# Patient Record
Sex: Female | Born: 1945
Health system: Southern US, Community
[De-identification: ages and names within clinical notes are randomized; demographics above are authoritative.]

## PROBLEM LIST (undated history)

## (undated) ENCOUNTER — Emergency Department (HOSPITAL_COMMUNITY)

## (undated) DIAGNOSIS — I10 Essential (primary) hypertension: Secondary | ICD-10-CM

## (undated) DIAGNOSIS — E785 Hyperlipidemia, unspecified: Secondary | ICD-10-CM

## (undated) DIAGNOSIS — C801 Malignant (primary) neoplasm, unspecified: Secondary | ICD-10-CM

## (undated) DIAGNOSIS — K219 Gastro-esophageal reflux disease without esophagitis: Secondary | ICD-10-CM

## (undated) DIAGNOSIS — C3490 Malignant neoplasm of unspecified part of unspecified bronchus or lung: Secondary | ICD-10-CM

## (undated) DIAGNOSIS — M1990 Unspecified osteoarthritis, unspecified site: Secondary | ICD-10-CM

## (undated) DIAGNOSIS — G4700 Insomnia, unspecified: Secondary | ICD-10-CM

## (undated) DIAGNOSIS — S42309A Unspecified fracture of shaft of humerus, unspecified arm, initial encounter for closed fracture: Secondary | ICD-10-CM

## (undated) DIAGNOSIS — F419 Anxiety disorder, unspecified: Secondary | ICD-10-CM

## (undated) DIAGNOSIS — J449 Chronic obstructive pulmonary disease, unspecified: Secondary | ICD-10-CM

## (undated) HISTORY — DX: Gastro-esophageal reflux disease without esophagitis: K21.9

## (undated) HISTORY — DX: Unspecified fracture of shaft of humerus, unspecified arm, initial encounter for closed fracture: S42.309A

## (undated) HISTORY — DX: Malignant neoplasm of unspecified part of unspecified bronchus or lung: C34.90

## (undated) HISTORY — PX: OTHER SURGICAL HISTORY: SHX169

## (undated) HISTORY — DX: Essential (primary) hypertension: I10

## (undated) HISTORY — DX: Hyperlipidemia, unspecified: E78.5

## (undated) HISTORY — DX: Anxiety disorder, unspecified: F41.9

## (undated) HISTORY — PX: JOINT REPLACEMENT: SHX530

## (undated) HISTORY — DX: Insomnia, unspecified: G47.00

---

## 1976-11-17 HISTORY — PX: TUBAL LIGATION: SHX77

## 2005-11-17 DIAGNOSIS — S42309A Unspecified fracture of shaft of humerus, unspecified arm, initial encounter for closed fracture: Secondary | ICD-10-CM

## 2005-11-17 HISTORY — DX: Unspecified fracture of shaft of humerus, unspecified arm, initial encounter for closed fracture: S42.309A

## 2006-02-17 ENCOUNTER — Encounter: Admission: RE | Admit: 2006-02-17 | Discharge: 2006-05-18 | Payer: Self-pay | Admitting: Orthopaedic Surgery

## 2006-05-19 ENCOUNTER — Encounter: Admission: RE | Admit: 2006-05-19 | Discharge: 2006-06-16 | Payer: Self-pay | Admitting: Orthopaedic Surgery

## 2009-12-27 ENCOUNTER — Emergency Department (HOSPITAL_COMMUNITY): Admission: EM | Admit: 2009-12-27 | Discharge: 2009-12-27 | Payer: Self-pay | Admitting: Emergency Medicine

## 2011-07-14 ENCOUNTER — Ambulatory Visit: Payer: Self-pay | Admitting: Physical Therapy

## 2011-07-15 ENCOUNTER — Ambulatory Visit: Payer: Medicare Other | Attending: Orthopaedic Surgery | Admitting: Physical Therapy

## 2011-07-15 DIAGNOSIS — R5381 Other malaise: Secondary | ICD-10-CM | POA: Insufficient documentation

## 2011-07-15 DIAGNOSIS — M25669 Stiffness of unspecified knee, not elsewhere classified: Secondary | ICD-10-CM | POA: Insufficient documentation

## 2011-07-15 DIAGNOSIS — IMO0001 Reserved for inherently not codable concepts without codable children: Secondary | ICD-10-CM | POA: Insufficient documentation

## 2011-07-15 DIAGNOSIS — M25569 Pain in unspecified knee: Secondary | ICD-10-CM | POA: Insufficient documentation

## 2011-07-16 ENCOUNTER — Ambulatory Visit: Payer: Medicare Other | Admitting: Physical Therapy

## 2011-07-18 ENCOUNTER — Ambulatory Visit: Payer: Medicare Other | Admitting: Physical Therapy

## 2011-07-22 ENCOUNTER — Ambulatory Visit: Payer: Medicare Other | Attending: Orthopaedic Surgery | Admitting: *Deleted

## 2011-07-22 DIAGNOSIS — M25669 Stiffness of unspecified knee, not elsewhere classified: Secondary | ICD-10-CM | POA: Insufficient documentation

## 2011-07-22 DIAGNOSIS — M25569 Pain in unspecified knee: Secondary | ICD-10-CM | POA: Insufficient documentation

## 2011-07-22 DIAGNOSIS — R5381 Other malaise: Secondary | ICD-10-CM | POA: Insufficient documentation

## 2011-07-22 DIAGNOSIS — IMO0001 Reserved for inherently not codable concepts without codable children: Secondary | ICD-10-CM | POA: Insufficient documentation

## 2011-07-24 ENCOUNTER — Ambulatory Visit: Payer: Medicare Other | Admitting: *Deleted

## 2011-07-29 ENCOUNTER — Ambulatory Visit: Payer: Medicare Other | Admitting: Physical Therapy

## 2011-07-31 ENCOUNTER — Ambulatory Visit: Payer: Medicare Other | Admitting: Physical Therapy

## 2011-08-04 ENCOUNTER — Ambulatory Visit: Payer: Medicare Other | Admitting: Physical Therapy

## 2011-08-05 ENCOUNTER — Ambulatory Visit: Payer: Medicare Other | Admitting: *Deleted

## 2011-08-08 ENCOUNTER — Ambulatory Visit: Payer: Medicare Other | Admitting: *Deleted

## 2011-08-11 ENCOUNTER — Ambulatory Visit: Payer: Medicare Other | Admitting: Physical Therapy

## 2011-08-13 ENCOUNTER — Ambulatory Visit: Payer: Medicare Other | Admitting: Physical Therapy

## 2011-08-15 ENCOUNTER — Ambulatory Visit: Payer: Medicare Other | Admitting: Physical Therapy

## 2011-08-19 ENCOUNTER — Ambulatory Visit: Payer: Medicare Other | Attending: Orthopaedic Surgery | Admitting: *Deleted

## 2011-08-19 DIAGNOSIS — R5381 Other malaise: Secondary | ICD-10-CM | POA: Insufficient documentation

## 2011-08-19 DIAGNOSIS — IMO0001 Reserved for inherently not codable concepts without codable children: Secondary | ICD-10-CM | POA: Insufficient documentation

## 2011-08-19 DIAGNOSIS — M25569 Pain in unspecified knee: Secondary | ICD-10-CM | POA: Insufficient documentation

## 2011-08-19 DIAGNOSIS — M25669 Stiffness of unspecified knee, not elsewhere classified: Secondary | ICD-10-CM | POA: Insufficient documentation

## 2011-08-20 ENCOUNTER — Ambulatory Visit: Payer: Medicare Other | Admitting: Physical Therapy

## 2011-08-22 ENCOUNTER — Ambulatory Visit: Payer: Medicare Other | Admitting: Physical Therapy

## 2011-08-26 ENCOUNTER — Ambulatory Visit: Payer: Medicare Other | Admitting: Physical Therapy

## 2011-08-28 ENCOUNTER — Ambulatory Visit: Payer: Medicare Other | Admitting: Physical Therapy

## 2011-08-29 ENCOUNTER — Ambulatory Visit: Payer: Medicare Other | Admitting: *Deleted

## 2011-09-02 ENCOUNTER — Ambulatory Visit: Payer: Medicare Other | Admitting: Physical Therapy

## 2011-09-03 ENCOUNTER — Ambulatory Visit: Payer: Medicare Other | Admitting: Physical Therapy

## 2011-09-05 ENCOUNTER — Ambulatory Visit: Payer: Medicare Other | Admitting: Physical Therapy

## 2011-09-08 ENCOUNTER — Ambulatory Visit: Payer: Medicare Other | Admitting: Physical Therapy

## 2011-09-10 ENCOUNTER — Ambulatory Visit: Payer: Medicare Other | Admitting: Physical Therapy

## 2011-09-12 ENCOUNTER — Ambulatory Visit: Payer: Medicare Other | Admitting: *Deleted

## 2011-09-16 ENCOUNTER — Ambulatory Visit: Payer: Medicare Other | Admitting: Physical Therapy

## 2011-09-18 ENCOUNTER — Ambulatory Visit: Payer: Medicare Other | Attending: Orthopaedic Surgery | Admitting: *Deleted

## 2011-09-18 DIAGNOSIS — M25569 Pain in unspecified knee: Secondary | ICD-10-CM | POA: Insufficient documentation

## 2011-09-18 DIAGNOSIS — IMO0001 Reserved for inherently not codable concepts without codable children: Secondary | ICD-10-CM | POA: Insufficient documentation

## 2011-09-18 DIAGNOSIS — R5381 Other malaise: Secondary | ICD-10-CM | POA: Insufficient documentation

## 2011-09-18 DIAGNOSIS — M25669 Stiffness of unspecified knee, not elsewhere classified: Secondary | ICD-10-CM | POA: Insufficient documentation

## 2011-10-06 DIAGNOSIS — Z96659 Presence of unspecified artificial knee joint: Secondary | ICD-10-CM | POA: Insufficient documentation

## 2013-02-23 DIAGNOSIS — H35379 Puckering of macula, unspecified eye: Secondary | ICD-10-CM | POA: Insufficient documentation

## 2013-02-23 DIAGNOSIS — IMO0002 Reserved for concepts with insufficient information to code with codable children: Secondary | ICD-10-CM | POA: Insufficient documentation

## 2013-05-26 ENCOUNTER — Other Ambulatory Visit: Payer: Self-pay | Admitting: Nurse Practitioner

## 2013-05-27 NOTE — Telephone Encounter (Signed)
Has appt 06/29/13, last filled 07/28/12

## 2013-05-31 ENCOUNTER — Other Ambulatory Visit: Payer: Self-pay | Admitting: Nurse Practitioner

## 2013-06-29 ENCOUNTER — Ambulatory Visit (INDEPENDENT_AMBULATORY_CARE_PROVIDER_SITE_OTHER): Payer: Medicare Other | Admitting: Nurse Practitioner

## 2013-06-29 ENCOUNTER — Encounter: Payer: Self-pay | Admitting: Nurse Practitioner

## 2013-06-29 ENCOUNTER — Other Ambulatory Visit: Payer: Medicare Other

## 2013-06-29 VITALS — BP 144/80 | HR 68 | Temp 98.3°F | Ht 62.0 in | Wt 184.0 lb

## 2013-06-29 DIAGNOSIS — K5732 Diverticulitis of large intestine without perforation or abscess without bleeding: Secondary | ICD-10-CM

## 2013-06-29 DIAGNOSIS — M8580 Other specified disorders of bone density and structure, unspecified site: Secondary | ICD-10-CM | POA: Insufficient documentation

## 2013-06-29 DIAGNOSIS — M25519 Pain in unspecified shoulder: Secondary | ICD-10-CM

## 2013-06-29 DIAGNOSIS — M899 Disorder of bone, unspecified: Secondary | ICD-10-CM

## 2013-06-29 DIAGNOSIS — K219 Gastro-esophageal reflux disease without esophagitis: Secondary | ICD-10-CM | POA: Insufficient documentation

## 2013-06-29 DIAGNOSIS — K5730 Diverticulosis of large intestine without perforation or abscess without bleeding: Secondary | ICD-10-CM | POA: Insufficient documentation

## 2013-06-29 DIAGNOSIS — M949 Disorder of cartilage, unspecified: Secondary | ICD-10-CM

## 2013-06-29 DIAGNOSIS — G4700 Insomnia, unspecified: Secondary | ICD-10-CM | POA: Insufficient documentation

## 2013-06-29 DIAGNOSIS — M25512 Pain in left shoulder: Secondary | ICD-10-CM

## 2013-06-29 DIAGNOSIS — F411 Generalized anxiety disorder: Secondary | ICD-10-CM | POA: Insufficient documentation

## 2013-06-29 DIAGNOSIS — Z23 Encounter for immunization: Secondary | ICD-10-CM

## 2013-06-29 DIAGNOSIS — E785 Hyperlipidemia, unspecified: Secondary | ICD-10-CM | POA: Insufficient documentation

## 2013-06-29 DIAGNOSIS — I10 Essential (primary) hypertension: Secondary | ICD-10-CM | POA: Insufficient documentation

## 2013-06-29 MED ORDER — LOSARTAN POTASSIUM-HCTZ 100-25 MG PO TABS: 1.0000 | ORAL_TABLET | Freq: Every day | ORAL | Status: DC

## 2013-06-29 MED ORDER — ATORVASTATIN CALCIUM 40 MG PO TABS: 40.0000 mg | ORAL_TABLET | Freq: Every day | ORAL | Status: DC

## 2013-06-29 MED ORDER — TRAZODONE HCL 50 MG PO TABS: 50.0000 mg | ORAL_TABLET | Freq: Every day | ORAL | Status: DC

## 2013-06-29 NOTE — Progress Notes (Signed)
Subjective:    Patient ID: Renee Maldonado, female    DOB: [birth year], 67 y.o.   MRN: [id number]  Hypertension This is a chronic problem. The current episode started more than 1 year ago. The problem is unchanged. The problem is controlled. Pertinent negatives include no blurred vision, chest pain, headaches, neck pain, orthopnea, palpitations, peripheral edema, PND, shortness of breath or sweats. There are no associated agents to hypertension. Risk factors for coronary artery disease include dyslipidemia, obesity and post-menopausal state. Past treatments include angiotensin blockers and diuretics. The current treatment provides moderate improvement. Compliance problems include diet and exercise.   Hyperlipidemia This is a chronic problem. The current episode started more than 1 year ago. The problem is uncontrolled. Recent lipid tests were reviewed and are high. Exacerbating diseases include obesity. She has no history of diabetes, hypothyroidism or liver disease. Factors aggravating her hyperlipidemia include thiazides. Pertinent negatives include no chest pain or shortness of breath. Current antihyperlipidemic treatment includes statins. There are no compliance problems.  Risk factors for coronary artery disease include hypertension, family history, obesity and post-menopausal.  GERD Currently trying diet control Ostenpenia Not on any meds- is trying weight bearing exercsie but doesn't do daily.Last dexa scan was 11/20/10. Insomnia Trazadone nightly- rest well Diverticulitis Patient tries to watch diet- no recent flare ups.  * Patient c/o left shoulder pain- Has had major tendon repair several years ago- woke up with pain while at the beach- hurts to move it.    Review of Systems  HENT: Negative for neck pain.   Eyes: Negative for blurred vision.  Respiratory: Negative for shortness of breath.   Cardiovascular: Negative for chest pain, palpitations, orthopnea and PND.  Neurological: Negative  for headaches.  All other systems reviewed and are negative.       Objective:   Physical Exam  Constitutional: She is oriented to person, place, and time. She appears well-developed and well-nourished.  HENT:  Nose: Nose normal.  Mouth/Throat: Oropharynx is clear and moist.  Eyes: EOM are normal.  Neck: Trachea normal, normal range of motion and full passive range of motion without pain. Neck supple. No JVD present. Carotid bruit is not present. No thyromegaly present.  Cardiovascular: Normal rate, regular rhythm, normal heart sounds and intact distal pulses.  Exam reveals no gallop and no friction rub.   No murmur heard. Pulmonary/Chest: Effort normal and breath sounds normal.  Abdominal: Soft. Bowel sounds are normal. She exhibits no distension and no mass. There is no tenderness.  Musculoskeletal: Normal range of motion.  Decrease ROM of left shoulder due to pain on internal rotation and abduction. No numbness or tingling distally.  Lymphadenopathy:    She has no cervical adenopathy.  Neurological: She is alert and oriented to person, place, and time. She has normal reflexes.  Skin: Skin is warm and dry.  Psychiatric: She has a normal mood and affect. Her behavior is normal. Judgment and thought content normal.  BP 144/80  Pulse 68  Temp(Src) 98.3 F (36.8 C) (Oral)  Ht 5\' 2"  (1.575 m)  Wt 184 lb (83.462 kg)  BMI 33.65 kg/m2         Assessment & Plan:  1. Hyperlipidemia Low fat diet an dexercsie - NMR, lipoprofile - atorvastatin (LIPITOR) 40 MG tablet; Take 1 tablet (40 mg total) by mouth daily.  Dispense: 30 tablet; Refill: 5  2. Hypertension Low NA+ diet - CMP14+EGFR - losartan-hydrochlorothiazide (HYZAAR) 100-25 MG per tablet; Take 1 tablet by mouth daily.  Dispense: 30  tablet; Refill: 5  3. GAD (generalized anxiety disorder) Stress management  4. Diverticulitis of colon (without mention of hemorrhage) Watch foods with small seeds  5. GERD  (gastroesophageal reflux disease) Watch spicy and fatty foods  6. Insomnia Bedtime ritual - traZODone (DESYREL) 50 MG tablet; Take 1 tablet (50 mg total) by mouth at bedtime.  Dispense: 30 tablet; Refill: 5  7. Osteopenia Will schedule dexa scan  8. Left shoulder pain Follow up with orthopedic surgeon  Mary-Margaret Daphine Deutscher, FNP

## 2013-06-29 NOTE — Patient Instructions (Signed)

## 2013-06-30 ENCOUNTER — Telehealth: Payer: Self-pay | Admitting: Family Medicine

## 2013-06-30 ENCOUNTER — Other Ambulatory Visit (INDEPENDENT_AMBULATORY_CARE_PROVIDER_SITE_OTHER): Payer: Medicare Other

## 2013-06-30 DIAGNOSIS — R7989 Other specified abnormal findings of blood chemistry: Secondary | ICD-10-CM

## 2013-06-30 LAB — POCT CBC
HCT, POC: 39 % (ref 37.7–47.9)
Hemoglobin: 13.3 g/dL (ref 12.2–16.2)
MCH, POC: 28.7 pg (ref 27–31.2)
MCHC: 34.1 g/dL (ref 31.8–35.4)
MCV: 84.2 fL (ref 80–97)
MPV: 9 fL (ref 0–99.8)
POC LYMPH PERCENT: 24 %L (ref 10–50)
RDW, POC: 13.3 %

## 2013-06-30 LAB — FECAL OCCULT BLOOD, IMMUNOCHEMICAL: Fecal Occult Blood: POSITIVE — AB

## 2013-06-30 NOTE — Progress Notes (Signed)
Patient came in for labs only.

## 2013-07-01 LAB — NMR, LIPOPROFILE
HDL Particle Number: 22.7 umol/L — ABNORMAL LOW (ref >=30.5)
LDL Particle Number: 2006 nmol/L — ABNORMAL HIGH (ref <1000)
LP-IR Score: 65 — ABNORMAL HIGH (ref <=45)
Small LDL Particle Number: 1250 nmol/L — ABNORMAL HIGH (ref <=527)

## 2013-07-01 LAB — CMP14+EGFR
Albumin/Globulin Ratio: 2.1 (ref 1.1–2.5)
Alkaline Phosphatase: 92 IU/L (ref 39–117)
CO2: 25 mmol/L (ref 18–29)
GFR calc non Af Amer: 54 mL/min/{1.73_m2} — ABNORMAL LOW (ref >59)
Globulin, Total: 2.1 g/dL (ref 1.5–4.5)
Glucose: 104 mg/dL — ABNORMAL HIGH (ref 65–99)
Potassium: 4.6 mmol/L (ref 3.5–5.2)

## 2013-07-01 NOTE — Telephone Encounter (Signed)
Pt aware that needs to see ortho

## 2013-07-11 DIAGNOSIS — M75102 Unspecified rotator cuff tear or rupture of left shoulder, not specified as traumatic: Secondary | ICD-10-CM | POA: Insufficient documentation

## 2013-07-15 ENCOUNTER — Other Ambulatory Visit (INDEPENDENT_AMBULATORY_CARE_PROVIDER_SITE_OTHER): Payer: Medicare Other

## 2013-07-15 DIAGNOSIS — Z1212 Encounter for screening for malignant neoplasm of rectum: Secondary | ICD-10-CM

## 2013-07-22 ENCOUNTER — Other Ambulatory Visit: Payer: Self-pay | Admitting: Nurse Practitioner

## 2013-07-22 DIAGNOSIS — R195 Other fecal abnormalities: Secondary | ICD-10-CM

## 2013-08-10 ENCOUNTER — Ambulatory Visit (INDEPENDENT_AMBULATORY_CARE_PROVIDER_SITE_OTHER): Payer: Medicare Other

## 2013-08-10 ENCOUNTER — Ambulatory Visit (INDEPENDENT_AMBULATORY_CARE_PROVIDER_SITE_OTHER): Payer: Medicare Other | Admitting: Pharmacist

## 2013-08-10 VITALS — Ht 62.0 in | Wt 185.0 lb

## 2013-08-10 DIAGNOSIS — M8580 Other specified disorders of bone density and structure, unspecified site: Secondary | ICD-10-CM

## 2013-08-10 DIAGNOSIS — M899 Disorder of bone, unspecified: Secondary | ICD-10-CM

## 2013-08-10 DIAGNOSIS — E785 Hyperlipidemia, unspecified: Secondary | ICD-10-CM

## 2013-08-10 MED ORDER — ROSUVASTATIN CALCIUM 20 MG PO TABS: 20.0000 mg | ORAL_TABLET | Freq: Every day | ORAL | Status: DC

## 2013-08-10 MED ORDER — RALOXIFENE HCL 60 MG PO TABS: 60.0000 mg | ORAL_TABLET | Freq: Every day | ORAL | Status: DC

## 2013-08-10 NOTE — Progress Notes (Signed)
Patient ID: Renee Maldonado, female   DOB: Nov 28, 1945, 67 y.o.   MRN: [id number]   [facility] Current Height: Height: 5\' 2"  (157.5 cm)      Max Lifetime Height:  5\' 3"  Current Weight: Weight: 185 lb (83.915 kg)       Ethnicity:Caucasian    HPI: Does pt already have a diagnosis of:  Osteopenia?  Yes Osteoporosis?  No  Back Pain?  Yes       Kyphosis?  No Prior fracture?  Yes - ribs and humerus Med(s) for Osteoporosis/Osteopenia:  None  currently Med(s) previously tried for Osteoporosis/Osteopenia:  None - has refused treatment in past  **patient is concern that atorvastatin is not best choice to reduce CV disease for her.  Would like to discuss other options.                                                             PMH: Age at menopause:  67 yo Hysterectomy?  No Oophorectomy?  No HRT? Yes - Former.  Type/duration: pt unsure but only took a short time because had spotting Steroid Use?  No Thyroid med?  No History of cancer?  No History of digestive disorders (ie Crohn's)?  No Current or previous eating disorders?  No Last Vitamin D Result:  46 (11/2012) Last GFR Result:  54 (07/04/2013)   FH/SH: Family history of osteoporosis?  No Parent with history of hip fracture?  No Family history of breast cancer?  No Exercise?  No Smoking?  Yes - not interested in trying to quit at this time Alcohol?  No    Calcium Assessment Calcium Intake  # of servings/day  Calcium mg  Milk (8 oz) 0  x  300  = 0  Yogurt (4 oz) 0 x  200 = 0  Cheese (1 oz) 1 x  200 = 200  Other Calcium sources   250mg   Ca supplement 600mg  bid = 1200mg    Estimated calcium intake per day 1450mg     DEXA Results Date of Test T-Score for AP Spine L1-L4 T-Score for Total Left Hip T-Score for Total Right Hip  08/10/2013 -1.9 -0.8 -0.9  11/20/2010 -1.1 -0.4 -0.5  08/23/2008 -1.7 -0.2 -0.5  08/17/2006 -1.8 -0.2 -0.6   FRAX 10 year estimate: Total FX risk:  17%  (consider medication if >/= 20%) Hip FX  risk:  4.1%  (consider medication if >/= 3%)  Assessment: Osteopenia with significant fracture risk and decrease in BMD compared to 2 years ago Hyperlipidemia - patient would like to switch from atorvastatin or Crestor  Recommendations: 1.  Start  reloxifine (EVISTA) 60mg  1 tablet dialy  Discontinue atorvastatin and start Crestor 20mg  1 tablet daily 2.  continue calcium 1200mg  daily through supplementation or diet.  3.  recommend weight bearing exercise - 30 minutes at least 4 days  per week.   4.  Counseled and educated about fall risk and prevention.  Recheck DEXA:  2 years  Time spent counseling patient:  40 minutes

## 2013-08-17 ENCOUNTER — Telehealth: Payer: Self-pay | Admitting: Nurse Practitioner

## 2013-08-18 ENCOUNTER — Telehealth: Payer: Self-pay | Admitting: Nurse Practitioner

## 2013-08-18 NOTE — Telephone Encounter (Signed)
Patient was concerned about side effects of evista 60mg .  We discussed concern of increased LDL and HDL as well as her daily smoking and heart risk with evista.   She has decided not to continue evista.

## 2013-08-18 NOTE — Telephone Encounter (Signed)
dulplicate call - patient and practitioner keep missing each other's calls

## 2013-08-26 ENCOUNTER — Encounter (INDEPENDENT_AMBULATORY_CARE_PROVIDER_SITE_OTHER): Payer: Self-pay | Admitting: *Deleted

## 2013-08-31 DIAGNOSIS — M75100 Unspecified rotator cuff tear or rupture of unspecified shoulder, not specified as traumatic: Secondary | ICD-10-CM | POA: Insufficient documentation

## 2013-09-14 ENCOUNTER — Encounter (INDEPENDENT_AMBULATORY_CARE_PROVIDER_SITE_OTHER): Payer: Self-pay | Admitting: Internal Medicine

## 2013-09-14 ENCOUNTER — Ambulatory Visit (INDEPENDENT_AMBULATORY_CARE_PROVIDER_SITE_OTHER): Payer: Medicare Other | Admitting: Internal Medicine

## 2013-09-14 VITALS — BP 133/66 | HR 76 | Temp 98.0°F | Ht 62.0 in | Wt 187.1 lb

## 2013-09-14 DIAGNOSIS — R195 Other fecal abnormalities: Secondary | ICD-10-CM | POA: Insufficient documentation

## 2013-09-14 NOTE — Patient Instructions (Addendum)
Your stool was negative today in the office. I think your bleeding was from the ASA and Aleve that you were taking.  Will see back in 3 months. Will get a CBC and check your stool again

## 2013-09-14 NOTE — Progress Notes (Signed)
Subjective:     Patient ID: Renee Maldonado, female   DOB: Dec 09, 1945, 67 y.o.   MRN: [id number]  HPI Referred to our office by Paulene Floor FNP, [facility] for 2 heme positive stool cards.This was during routine physical. She tells me she was taking Aspirin/Aleve. She had injured her shoulder.  She was taking Aleve two a day. This did not help so she started taking Aspirin 325mg . She was taking Aspirin 325mg  every 3-4 hrs. Occurred in July She had been taking for about 4 weeks. She is not taking anything now except a Baby ASA 81mg   She denies seeing any blood in her stool. She has not lost any weight. Her appetite is good. She usually has a BM several times a day.  Her last colonoscopy was February of 2012 for screening by [facility] in [facility] by Dr. Bernie Covey. Internal hemorrhoids in addition to minimal diverticulosis in the sigmoid colon, but remainder of colon appeared normal.   Any NSAIDs H and H stable at 13.3 and 39.0 CBC    Component Value Date/Time   WBC 6.3 06/30/2013 1533   RBC 4.6 06/30/2013 1533   HGB 13.3 06/30/2013 1533   HCT 39.0 06/30/2013 1533   MCV 84.2 06/30/2013 1533   MCH 28.7 06/30/2013 1533   MCHC 34.1 06/30/2013 1533     Review of Systems Current Outpatient Prescriptions  Medication Sig Dispense Refill  . Biotin 1 MG CAPS Take by mouth daily.      . Calcium-Vitamin D-Vitamin K (CALCIUM + D + K PO) Take by mouth 2 (two) times daily before a meal.      . cholecalciferol (VITAMIN D) 1000 UNITS tablet Take 1,000 Units by mouth daily.      . fish oil-omega-3 fatty acids 1000 MG capsule Take 1 g by mouth 2 (two) times daily before a meal.      . Garlic 1000 MG CAPS Take by mouth daily.      Marland Kitchen losartan-hydrochlorothiazide (HYZAAR) 100-25 MG per tablet Take 1 tablet by mouth daily.  30 tablet  5  . Lysine 500 MG TABS Take by mouth 2 (two) times daily before a meal.      . ranitidine (ZANTAC) 150 MG tablet Take 150 mg by  mouth 2 (two) times daily.      . rosuvastatin (CRESTOR) 20 MG tablet Take 1 tablet (20 mg total) by mouth daily.  30 tablet  2  . traZODone (DESYREL) 50 MG tablet Take 1 tablet (50 mg total) by mouth at bedtime.  30 tablet  5   No current facility-administered medications for this visit.   Past Medical History  Diagnosis Date  . Anxiety   . Hyperlipidemia   . Insomnia   . GERD (gastroesophageal reflux disease)   . Hypertension    Past Surgical History  Procedure Laterality Date  . Total knee arthroplasty      2012 left  . Left shoulder surgery      2007   Allergies  Allergen Reactions  . Benadryl [Diphenhydramine Hcl]   . Penicillins         Objective:   Physical Exam  Filed Vitals:   09/14/13 1439  BP: 133/66  Pulse: 76  Temp: 98 F (36.7 C)  Height: 5\' 2"  (1.575 m)  Weight: 187 lb 1.6 oz (84.868 kg)  Alert and oriented. Skin warm and dry. Oral mucosa is moist.   . Sclera anicteric, conjunctivae is pink. Thyroid not  enlarged. No cervical lymphadenopathy. Lungs clear. Heart regular rate and rhythm.  Abdomen is soft. Bowel sounds are positive. No hepatomegaly. No abdominal masses felt. No tenderness.  No edema to lower extremities. Stool brown and guaiac negative.     Assessment:    Heme positive stools. Last colonoscopy in 2012.    Plan:    Heme positive stools. Last colonoscopy 2012 was normal. Stools positive probably from the NSAIDs she was taking.  Her H and H are stable.    She was guaiac negative today.  Will see back in office in 3 months. Will check a CBC and I will guaiac her stool again.  If she is positive or she has dropped her hemoglobin, will proceed with a colonoscopy. I discussed this case with Dr. Karilyn Cota.

## 2013-09-15 ENCOUNTER — Telehealth (INDEPENDENT_AMBULATORY_CARE_PROVIDER_SITE_OTHER): Payer: Self-pay | Admitting: *Deleted

## 2013-09-15 DIAGNOSIS — K921 Melena: Secondary | ICD-10-CM

## 2013-09-15 NOTE — Telephone Encounter (Signed)
.  Per Delrae Rend to have in January 2015 , noted for the 28 th prior to her office visit.

## 2013-09-23 ENCOUNTER — Telehealth: Payer: Self-pay | Admitting: Nurse Practitioner

## 2013-09-23 MED ORDER — ROSUVASTATIN CALCIUM 20 MG PO TABS: 20.0000 mg | ORAL_TABLET | Freq: Every day | ORAL | Status: DC

## 2013-09-23 NOTE — Telephone Encounter (Signed)
rX sent to pharmacy 

## 2013-09-30 ENCOUNTER — Telehealth: Payer: Self-pay | Admitting: Nurse Practitioner

## 2013-09-30 MED ORDER — AZITHROMYCIN 250 MG PO TABS: ORAL_TABLET | ORAL | Status: DC

## 2013-09-30 NOTE — Telephone Encounter (Signed)
rx called in

## 2013-10-01 NOTE — Telephone Encounter (Signed)
Patient aware rx sent into pharmacy. 

## 2013-10-06 ENCOUNTER — Ambulatory Visit (INDEPENDENT_AMBULATORY_CARE_PROVIDER_SITE_OTHER): Payer: Medicare Other | Admitting: Nurse Practitioner

## 2013-10-06 ENCOUNTER — Encounter: Payer: Self-pay | Admitting: Nurse Practitioner

## 2013-10-06 VITALS — BP 138/82 | HR 64 | Temp 98.2°F | Ht 62.0 in | Wt 188.0 lb

## 2013-10-06 DIAGNOSIS — M8580 Other specified disorders of bone density and structure, unspecified site: Secondary | ICD-10-CM

## 2013-10-06 DIAGNOSIS — K219 Gastro-esophageal reflux disease without esophagitis: Secondary | ICD-10-CM

## 2013-10-06 DIAGNOSIS — R195 Other fecal abnormalities: Secondary | ICD-10-CM

## 2013-10-06 DIAGNOSIS — M899 Disorder of bone, unspecified: Secondary | ICD-10-CM

## 2013-10-06 DIAGNOSIS — F411 Generalized anxiety disorder: Secondary | ICD-10-CM

## 2013-10-06 DIAGNOSIS — E785 Hyperlipidemia, unspecified: Secondary | ICD-10-CM

## 2013-10-06 DIAGNOSIS — G4700 Insomnia, unspecified: Secondary | ICD-10-CM

## 2013-10-06 DIAGNOSIS — I10 Essential (primary) hypertension: Secondary | ICD-10-CM

## 2013-10-06 MED ORDER — RANITIDINE HCL 150 MG PO TABS: 150.0000 mg | ORAL_TABLET | Freq: Two times a day (BID) | ORAL | Status: DC

## 2013-10-06 MED ORDER — LOSARTAN POTASSIUM-HCTZ 100-25 MG PO TABS: 1.0000 | ORAL_TABLET | Freq: Every day | ORAL | Status: DC

## 2013-10-06 MED ORDER — TRAZODONE HCL 50 MG PO TABS: 50.0000 mg | ORAL_TABLET | Freq: Every day | ORAL | Status: DC

## 2013-10-06 NOTE — Patient Instructions (Signed)
Fat and Cholesterol Control Diet  Fat and cholesterol levels in your blood and organs are influenced by your diet. High levels of fat and cholesterol may lead to diseases of the heart, small and large blood vessels, gallbladder, liver, and pancreas.  CONTROLLING FAT AND CHOLESTEROL WITH DIET  Although exercise and lifestyle factors are important, your diet is key. That is because certain foods are known to raise cholesterol and others to lower it. The goal is to balance foods for their effect on cholesterol and more importantly, to replace saturated and trans fat with other types of fat, such as monounsaturated fat, polyunsaturated fat, and omega-3 fatty acids.  On average, a person should consume no more than 15 to 17 g of saturated fat daily. Saturated and trans fats are considered "bad" fats, and they will raise LDL cholesterol. Saturated fats are primarily found in animal products such as meats, butter, and cream. However, that does not mean you need to give up all your favorite foods. Today, there are good tasting, low-fat, low-cholesterol substitutes for most of the things you like to eat. Choose low-fat or nonfat alternatives. Choose round or loin cuts of red meat. These types of cuts are lowest in fat and cholesterol. Chicken (without the skin), fish, veal, and ground turkey breast are great choices. Eliminate fatty meats, such as hot dogs and salami. Even shellfish have little or no saturated fat. Have a 3 oz (85 g) portion when you eat lean meat, poultry, or fish.  Trans fats are also called "partially hydrogenated oils." They are oils that have been scientifically manipulated so that they are solid at room temperature resulting in a longer shelf life and improved taste and texture of foods in which they are added. Trans fats are found in stick margarine, some tub margarines, cookies, crackers, and baked goods.   When baking and cooking, oils are a great substitute for butter. The monounsaturated oils are  especially beneficial since it is believed they lower LDL and raise HDL. The oils you should avoid entirely are saturated tropical oils, such as coconut and palm.   Remember to eat a lot from food groups that are naturally free of saturated and trans fat, including fish, fruit, vegetables, beans, grains (barley, rice, couscous, bulgur wheat), and pasta (without cream sauces).   IDENTIFYING FOODS THAT LOWER FAT AND CHOLESTEROL   Soluble fiber may lower your cholesterol. This type of fiber is found in fruits such as apples, vegetables such as broccoli, potatoes, and carrots, legumes such as beans, peas, and lentils, and grains such as barley. Foods fortified with plant sterols (phytosterol) may also lower cholesterol. You should eat at least 2 g per day of these foods for a cholesterol lowering effect.   Read package labels to identify low-saturated fats, trans fat free, and low-fat foods at the supermarket. Select cheeses that have only 2 to 3 g saturated fat per ounce. Use a heart-healthy tub margarine that is free of trans fats or partially hydrogenated oil. When buying baked goods (cookies, crackers), avoid partially hydrogenated oils. Breads and muffins should be made from whole grains (whole-wheat or whole oat flour, instead of "flour" or "enriched flour"). Buy non-creamy canned soups with reduced salt and no added fats.   FOOD PREPARATION TECHNIQUES   Never deep-fry. If you must fry, either stir-fry, which uses very little fat, or use non-stick cooking sprays. When possible, broil, bake, or roast meats, and steam vegetables. Instead of putting butter or margarine on vegetables, use lemon   and herbs, applesauce, and cinnamon (for squash and sweet potatoes). Use nonfat yogurt, salsa, and low-fat dressings for salads.   LOW-SATURATED FAT / LOW-FAT FOOD SUBSTITUTES  Meats / Saturated Fat (g)  · Avoid: Steak, marbled (3 oz/85 g) / 11 g  · Choose: Steak, lean (3 oz/85 g) / 4 g  · Avoid: Hamburger (3 oz/85 g) / 7  g  · Choose: Hamburger, lean (3 oz/85 g) / 5 g  · Avoid: Ham (3 oz/85 g) / 6 g  · Choose: Ham, lean cut (3 oz/85 g) / 2.4 g  · Avoid: Chicken, with skin, dark meat (3 oz/85 g) / 4 g  · Choose: Chicken, skin removed, dark meat (3 oz/85 g) / 2 g  · Avoid: Chicken, with skin, light meat (3 oz/85 g) / 2.5 g  · Choose: Chicken, skin removed, light meat (3 oz/85 g) / 1 g  Dairy / Saturated Fat (g)  · Avoid: Whole milk (1 cup) / 5 g  · Choose: Low-fat milk, 2% (1 cup) / 3 g  · Choose: Low-fat milk, 1% (1 cup) / 1.5 g  · Choose: Skim milk (1 cup) / 0.3 g  · Avoid: Hard cheese (1 oz/28 g) / 6 g  · Choose: Skim milk cheese (1 oz/28 g) / 2 to 3 g  · Avoid: Cottage cheese, 4% fat (1 cup) / 6.5 g  · Choose: Low-fat cottage cheese, 1% fat (1 cup) / 1.5 g  · Avoid: Ice cream (1 cup) / 9 g  · Choose: Sherbet (1 cup) / 2.5 g  · Choose: Nonfat frozen yogurt (1 cup) / 0.3 g  · Choose: Frozen fruit bar / trace  · Avoid: Whipped cream (1 tbs) / 3.5 g  · Choose: Nondairy whipped topping (1 tbs) / 1 g  Condiments / Saturated Fat (g)  · Avoid: Mayonnaise (1 tbs) / 2 g  · Choose: Low-fat mayonnaise (1 tbs) / 1 g  · Avoid: Butter (1 tbs) / 7 g  · Choose: Extra light margarine (1 tbs) / 1 g  · Avoid: Coconut oil (1 tbs) / 11.8 g  · Choose: Olive oil (1 tbs) / 1.8 g  · Choose: Corn oil (1 tbs) / 1.7 g  · Choose: Safflower oil (1 tbs) / 1.2 g  · Choose: Sunflower oil (1 tbs) / 1.4 g  · Choose: Soybean oil (1 tbs) / 2.4 g  · Choose: Canola oil (1 tbs) / 1 g  Document Released: 11/03/2005 Document Revised: 02/28/2013 Document Reviewed: 04/24/2011  [employer]® Patient Information ©2014 [employer].

## 2013-10-06 NOTE — Progress Notes (Signed)
Subjective:    Patient ID: Renee Maldonado, female    DOB: 1946/05/16, 67 y.o.   MRN: [id number]  Hypertension This is a chronic problem. The current episode started more than 1 year ago. The problem is unchanged. The problem is controlled. Pertinent negatives include no blurred vision, chest pain, headaches, neck pain, orthopnea, palpitations, peripheral edema, PND, shortness of breath or sweats. There are no associated agents to hypertension. Risk factors for coronary artery disease include dyslipidemia, obesity and post-menopausal state. Past treatments include angiotensin blockers and diuretics. The current treatment provides moderate improvement. Compliance problems include diet and exercise.   Hyperlipidemia This is a chronic problem. The current episode started more than 1 year ago. The problem is uncontrolled. Recent lipid tests were reviewed and are high. Exacerbating diseases include obesity. She has no history of diabetes, hypothyroidism or liver disease. Factors aggravating her hyperlipidemia include thiazides. Pertinent negatives include no chest pain or shortness of breath. Current antihyperlipidemic treatment includes statins (recently changed from lipitor to crestor). There are no compliance problems.  Risk factors for coronary artery disease include hypertension, family history, obesity and post-menopausal.  GERD Currently trying diet control Ostenpenia Not on any meds- is trying weight bearing exercsie but doesn't do daily.Last dexa scan was 11/20/10. Insomnia Trazadone nightly- rest well Diverticulitis Patient tries to watch diet- no recent flare ups.  * Patient was suppose to have started on evista for osteopenia but after reading the side effects she refused to take. * 2 positive hemocults- went  To Dr. Dionicia Abler PA na dshe found no blood in stool- has a 3 month follow up which will be in January.  Review of Systems  Eyes: Negative for blurred vision.  Respiratory: Negative for  shortness of breath.   Cardiovascular: Negative for chest pain, palpitations, orthopnea and PND.  Musculoskeletal: Negative for neck pain.  Neurological: Negative for headaches.  All other systems reviewed and are negative.       Objective:   Physical Exam  Constitutional: She is oriented to person, place, and time. She appears well-developed and well-nourished.  HENT:  Nose: Nose normal.  Mouth/Throat: Oropharynx is clear and moist.  Eyes: EOM are normal.  Neck: Trachea normal, normal range of motion and full passive range of motion without pain. Neck supple. No JVD present. Carotid bruit is not present. No thyromegaly present.  Cardiovascular: Normal rate, regular rhythm, normal heart sounds and intact distal pulses.  Exam reveals no gallop and no friction rub.   No murmur heard. Pulmonary/Chest: Effort normal and breath sounds normal.  Abdominal: Soft. Bowel sounds are normal. She exhibits no distension and no mass. There is no tenderness.  Musculoskeletal: Normal range of motion.  Lymphadenopathy:    She has no cervical adenopathy.  Neurological: She is alert and oriented to person, place, and time. She has normal reflexes.  Skin: Skin is warm and dry.  Psychiatric: She has a normal mood and affect. Her behavior is normal. Judgment and thought content normal.  BP 155/92  Pulse 64  Temp(Src) 98.2 F (36.8 C) (Oral)  Ht 5\' 2"  (1.575 m)  Wt 188 lb (85.276 kg)  BMI 34.38 kg/m2         Assessment & Plan:   1. Osteopenia   2. Insomnia   3. Hypertension   4. Hyperlipidemia   5. Heme positive stool   6. GERD (gastroesophageal reflux disease)   7. GAD (generalized anxiety disorder)    Orders Placed This Encounter  Procedures  . CMP14+EGFR  .  NMR, lipoprofile   Meds ordered this encounter  Medications  . traZODone (DESYREL) 50 MG tablet    Sig: Take 1 tablet (50 mg total) by mouth at bedtime.    Dispense:  90 tablet    Refill:  1    Order Specific Question:   Supervising Provider    Answer:  Ernestina Penna [[id number]]  . losartan-hydrochlorothiazide (HYZAAR) 100-25 MG per tablet    Sig: Take 1 tablet by mouth daily.    Dispense:  90 tablet    Refill:  1    Order Specific Question:  Supervising Provider    Answer:  Ernestina Penna [[id number]]  . ranitidine (ZANTAC) 150 MG tablet    Sig: Take 1 tablet (150 mg total) by mouth 2 (two) times daily.    Dispense:  180 tablet    Refill:  1    Order Specific Question:  Supervising Provider    Answer:  Deborra Medina    Continue all meds Labs pending Diet and exercise encouraged Health maintenance reviewed Follow up in 3 month  Mary-Margaret Daphine Deutscher, FNP

## 2013-10-08 LAB — NMR, LIPOPROFILE
Cholesterol: 132 mg/dL (ref <200)
HDL Cholesterol by NMR: 46 mg/dL (ref >=40)
LDL Size: 21.3 nm (ref >20.5)
Small LDL Particle Number: 413 nmol/L (ref <=527)
Triglycerides by NMR: 78 mg/dL (ref <150)

## 2013-10-08 LAB — CMP14+EGFR
BUN: 28 mg/dL — ABNORMAL HIGH (ref 8–27)
Calcium: 9.6 mg/dL (ref 8.6–10.2)
Chloride: 102 mmol/L (ref 97–108)
Glucose: 112 mg/dL — ABNORMAL HIGH (ref 65–99)
Potassium: 5.2 mmol/L (ref 3.5–5.2)
Sodium: 144 mmol/L (ref 134–144)
Total Protein: 6.5 g/dL (ref 6.0–8.5)

## 2013-10-27 ENCOUNTER — Other Ambulatory Visit (INDEPENDENT_AMBULATORY_CARE_PROVIDER_SITE_OTHER): Payer: Self-pay | Admitting: *Deleted

## 2013-10-27 ENCOUNTER — Encounter (INDEPENDENT_AMBULATORY_CARE_PROVIDER_SITE_OTHER): Payer: Self-pay | Admitting: *Deleted

## 2013-10-27 DIAGNOSIS — K921 Melena: Secondary | ICD-10-CM

## 2013-11-09 ENCOUNTER — Ambulatory Visit (INDEPENDENT_AMBULATORY_CARE_PROVIDER_SITE_OTHER): Payer: Medicare Other | Admitting: General Practice

## 2013-11-09 ENCOUNTER — Encounter: Payer: Self-pay | Admitting: General Practice

## 2013-11-09 VITALS — BP 145/92 | HR 76 | Temp 99.6°F | Ht 62.0 in | Wt 188.0 lb

## 2013-11-09 DIAGNOSIS — R509 Fever, unspecified: Secondary | ICD-10-CM

## 2013-11-09 DIAGNOSIS — M791 Myalgia, unspecified site: Secondary | ICD-10-CM

## 2013-11-09 DIAGNOSIS — IMO0001 Reserved for inherently not codable concepts without codable children: Secondary | ICD-10-CM

## 2013-11-09 DIAGNOSIS — A088 Other specified intestinal infections: Secondary | ICD-10-CM

## 2013-11-09 DIAGNOSIS — R110 Nausea: Secondary | ICD-10-CM

## 2013-11-09 DIAGNOSIS — A084 Viral intestinal infection, unspecified: Secondary | ICD-10-CM

## 2013-11-09 LAB — POCT INFLUENZA A/B: Influenza B, POC: NEGATIVE

## 2013-11-09 MED ORDER — ONDANSETRON HCL 4 MG PO TABS: 4.0000 mg | ORAL_TABLET | Freq: Three times a day (TID) | ORAL | Status: DC | PRN

## 2013-11-09 NOTE — Patient Instructions (Signed)

## 2013-11-09 NOTE — Progress Notes (Signed)
   Subjective:    Patient ID: Renee Maldonado, female    DOB: 1946-02-16, 67 y.o.   MRN: [id number]  Fever  This is a new problem. The current episode started yesterday. The problem occurs daily. The problem has been unchanged. The maximum temperature noted was 101 to 101.9 F. The temperature was taken using an oral thermometer. Associated symptoms include muscle aches and nausea. Pertinent negatives include no chest pain, coughing, headaches or sore throat. She has tried acetaminophen for the symptoms. The treatment provided significant relief.  Headache  This is a new problem. The current episode started yesterday. The problem occurs intermittently. The problem has been gradually improving. The pain is located in the bilateral region. The pain does not radiate. The pain quality is similar to prior headaches. The quality of the pain is described as aching. The pain is at a severity of 2/10. Associated symptoms include a fever, muscle aches and nausea. Pertinent negatives include no back pain, blurred vision, coughing, dizziness, drainage, loss of balance, phonophobia, photophobia, rhinorrhea, scalp tenderness, sinus pressure, sore throat, tingling, tinnitus, visual change or weakness. Nothing aggravates the symptoms. She has tried acetaminophen for the symptoms. Her past medical history is significant for hypertension. There is no history of cluster headaches or recent head traumas.  Reports recent exposure to stomach flu.     Review of Systems  Constitutional: Positive for fever.  HENT: Negative for rhinorrhea, sinus pressure, sore throat and tinnitus.   Eyes: Negative for blurred vision and photophobia.  Respiratory: Negative for cough.   Cardiovascular: Negative for chest pain.  Gastrointestinal: Positive for nausea.  Musculoskeletal: Negative for back pain.  Neurological: Negative for dizziness, tingling, weakness, headaches and loss of balance.       Objective:   Physical Exam    Constitutional: She is oriented to person, place, and time. She appears well-developed and well-nourished.  HENT:  Head: Normocephalic and atraumatic.  Right Ear: External ear normal.  Left Ear: External ear normal.  Mouth/Throat: Oropharynx is clear and moist.  Eyes: EOM are normal. Pupils are equal, round, and reactive to light.  Neck: Normal range of motion. Neck supple. No thyromegaly present.  Cardiovascular: Normal rate, regular rhythm and normal heart sounds.   Pulmonary/Chest: Effort normal and breath sounds normal.  Abdominal: Soft. Bowel sounds are normal. She exhibits no distension. There is no tenderness.  Lymphadenopathy:    She has no cervical adenopathy.  Neurological: She is alert and oriented to person, place, and time.  Skin: Skin is warm and dry.  Psychiatric: She has a normal mood and affect.      Results for orders placed in visit on 11/09/13  POCT INFLUENZA A/B      Result Value Range   Influenza A, POC Negative     Influenza B, POC Negative         Assessment & Plan:  1. Fever  - POCT Influenza A/B  2. Muscle ache  - POCT Influenza A/B  3. Nausea alone  - ondansetron (ZOFRAN) 4 MG tablet; Take 1 tablet (4 mg total) by mouth every 8 (eight) hours as needed for nausea or vomiting.  Dispense: 20 tablet; Refill: 0 4. Viral gastroenteritis -discussed and provided information pertaining to norovirus -RTO if symptoms worsen or unresolved, may seek emergency medical treatment  -Patient verbalized understanding Coralie Keens, FNP-C

## 2013-12-15 ENCOUNTER — Ambulatory Visit (INDEPENDENT_AMBULATORY_CARE_PROVIDER_SITE_OTHER): Payer: 59 | Admitting: Internal Medicine

## 2013-12-15 ENCOUNTER — Encounter (INDEPENDENT_AMBULATORY_CARE_PROVIDER_SITE_OTHER): Payer: Self-pay | Admitting: Internal Medicine

## 2013-12-15 ENCOUNTER — Ambulatory Visit (INDEPENDENT_AMBULATORY_CARE_PROVIDER_SITE_OTHER): Payer: Medicare (Managed Care) | Admitting: Internal Medicine

## 2013-12-15 VITALS — BP 144/80 | HR 60 | Temp 98.0°F | Ht 62.5 in | Wt 193.5 lb

## 2013-12-15 DIAGNOSIS — R195 Other fecal abnormalities: Secondary | ICD-10-CM

## 2013-12-15 LAB — CBC
HCT: 37.7 % (ref 36.0–46.0)
Hemoglobin: 12.3 g/dL (ref 12.0–15.0)
MCH: 27.8 pg (ref 26.0–34.0)
MCHC: 32.6 g/dL (ref 30.0–36.0)
MCV: 85.3 fL (ref 78.0–100.0)
Platelets: 257 10*3/uL (ref 150–400)
RBC: 4.42 MIL/uL (ref 3.87–5.11)
RDW: 14 % (ref 11.5–15.5)
WBC: 4 10*3/uL (ref 4.0–10.5)

## 2013-12-15 NOTE — Progress Notes (Signed)
Subjective:     Patient ID: Renee Maldonado, female   DOB: 04/14/1946, 68 y.o.   MRN: [id number]  HPI Here today for f/u. She was last seen in our office in October. She had 2 heme positive stools cards per Ronnald Collum FNP, [facility].  She had been taking ASA/Aleve in July after she injuring her shoulder. She had been taking ASA 325mg  every 3-4 hrs. And two Aleve a day. I discussed this case with Dr. Laural Golden in October. If her hemocult is positive th is visit will proceed with a colonoscopy.  She tells me she is doing good. Appetite is good. No weight loss. No abdominal pain. No dysphagia.  She usually has a BM 3-4 times in the am. No melena or bright red rectal bleeding.   Her last colonoscopy was February of 2012 for screening by [facility] in [facility] by Dr. Pura Spice. Internal hemorrhoids in addition to minimal diverticulosis in the sigmoid colon, but remainder of colon appeared normal.      12/14/2013 H and H 12.3 and 37.7  CBC    Component Value Date/Time   WBC 4.0 12/14/2013 1100   WBC 6.3 06/30/2013 1533   RBC 4.42 12/14/2013 1100   RBC 4.6 06/30/2013 1533   HGB 12.3 12/14/2013 1100   HGB 13.3 06/30/2013 1533   HCT 37.7 12/14/2013 1100   HCT 39.0 06/30/2013 1533   PLT 257 12/14/2013 1100   MCV 85.3 12/14/2013 1100   MCV 84.2 06/30/2013 1533   MCH 27.8 12/14/2013 1100   MCH 28.7 06/30/2013 1533   MCHC 32.6 12/14/2013 1100   MCHC 34.1 06/30/2013 1533   RDW 14.0 12/14/2013 1100      Review of Systems see hpi Current Outpatient Prescriptions  Medication Sig Dispense Refill  . Biotin 1 MG CAPS Take by mouth daily.      . Calcium-Vitamin D-Vitamin K (CALCIUM + D + K PO) Take by mouth 2 (two) times daily before a meal.      . cholecalciferol (VITAMIN D) 1000 UNITS tablet Take 1,000 Units by mouth daily.      . fish oil-omega-3 fatty acids 1000 MG capsule Take 1 g by mouth 2 (two) times daily before a meal.      . Garlic 0076 MG CAPS  Take by mouth daily.      Marland Kitchen losartan-hydrochlorothiazide (HYZAAR) 100-25 MG per tablet Take 1 tablet by mouth daily.  90 tablet  1  . Lysine 500 MG TABS Take by mouth 2 (two) times daily before a meal.      . ondansetron (ZOFRAN) 4 MG tablet Take 1 tablet (4 mg total) by mouth every 8 (eight) hours as needed for nausea or vomiting.  20 tablet  0  . ranitidine (ZANTAC) 150 MG tablet Take 1 tablet (150 mg total) by mouth 2 (two) times daily.  180 tablet  1  . rosuvastatin (CRESTOR) 20 MG tablet Take 1 tablet (20 mg total) by mouth daily.  90 tablet  3  . traZODone (DESYREL) 50 MG tablet Take 1 tablet (50 mg total) by mouth at bedtime.  90 tablet  1   No current facility-administered medications for this visit.   Past Medical History  Diagnosis Date  . Anxiety   . Hyperlipidemia   . Insomnia   . GERD (gastroesophageal reflux disease)   . Hypertension    Past Surgical History  Procedure Laterality Date  . Total knee arthroplasty  2012 left  . Left shoulder surgery      2007   Allergies  Allergen Reactions  . Benadryl [Diphenhydramine Hcl]   . Penicillins         Objective:   Physical Exam  Filed Vitals:   12/15/13 1059  BP: 144/80  Pulse: 60  Temp: 98 F (36.7 C)  Height: 5' 2.5" (1.588 m)  Weight: 193 lb 8 oz (87.771 kg)   Alert and oriented. Skin warm and dry. Oral mucosa is moist.   . Sclera anicteric, conjunctivae is pink. Thyroid not enlarged. No cervical lymphadenopathy. Lungs clear. Heart regular rate and rhythm.  Abdomen is soft. Bowel sounds are positive. No hepatomegaly. No abdominal masses felt. No tenderness.  No edema to lower extremities.  Stools brown and guaiac negative.      Assessment:    Heme positive stools probably NSAID induced. She was guaiac negative today. No further work up.    Plan:    OV on a prn basis.

## 2013-12-15 NOTE — Patient Instructions (Signed)
OV in a prn basis.

## 2013-12-19 ENCOUNTER — Encounter: Payer: Self-pay | Admitting: Family Medicine

## 2013-12-19 ENCOUNTER — Telehealth: Payer: Self-pay | Admitting: Family Medicine

## 2013-12-19 ENCOUNTER — Ambulatory Visit (INDEPENDENT_AMBULATORY_CARE_PROVIDER_SITE_OTHER): Payer: Medicare Other | Admitting: Family Medicine

## 2013-12-19 ENCOUNTER — Ambulatory Visit (INDEPENDENT_AMBULATORY_CARE_PROVIDER_SITE_OTHER): Payer: Medicare Other

## 2013-12-19 VITALS — BP 133/73 | HR 60 | Temp 99.0°F | Ht 62.0 in | Wt 192.2 lb

## 2013-12-19 DIAGNOSIS — W19XXXA Unspecified fall, initial encounter: Secondary | ICD-10-CM

## 2013-12-19 DIAGNOSIS — L03119 Cellulitis of unspecified part of limb: Secondary | ICD-10-CM

## 2013-12-19 DIAGNOSIS — IMO0002 Reserved for concepts with insufficient information to code with codable children: Secondary | ICD-10-CM

## 2013-12-19 DIAGNOSIS — S80211A Abrasion, right knee, initial encounter: Secondary | ICD-10-CM

## 2013-12-19 DIAGNOSIS — L03115 Cellulitis of right lower limb: Secondary | ICD-10-CM | POA: Insufficient documentation

## 2013-12-19 DIAGNOSIS — L02419 Cutaneous abscess of limb, unspecified: Secondary | ICD-10-CM

## 2013-12-19 MED ORDER — SULFAMETHOXAZOLE-TMP DS 800-160 MG PO TABS: 1.0000 | ORAL_TABLET | Freq: Two times a day (BID) | ORAL | Status: DC

## 2013-12-19 MED ORDER — MUPIROCIN 2 % EX OINT: 1.0000 "application " | TOPICAL_OINTMENT | Freq: Two times a day (BID) | CUTANEOUS | Status: DC

## 2013-12-19 NOTE — Progress Notes (Signed)
Patient ID: Renee Maldonado, female   DOB: 05-Mar-1946, 68 y.o.   MRN: [id number] SUBJECTIVE: CC: Chief Complaint  Patient presents with  . Acute Visit    FELL SATURDAY RT KNEE STATES HAS HAD KNEE SURGERY IN PAST ON KNEE    HPI: Tripped and fell on the right knee on her brick deck. Came  To check to ensure patella not broken and that she needs antibiotics for infection due to breaking the skin and scratching up the knee and the area is getting more sore and red. No drainage. concern more than usual because the other knee is a TKR and afraid of an infection spreading in the blood to the other knee. No fever. Doesn't feel ill.  Past Medical History  Diagnosis Date  . Anxiety   . Hyperlipidemia   . Insomnia   . GERD (gastroesophageal reflux disease)   . Hypertension    Past Surgical History  Procedure Laterality Date  . Total knee arthroplasty      2012 left  . Left shoulder surgery      2007   History   Social History  . Marital Status: Married    Spouse Name: N/A    Number of Children: N/A  . Years of Education: N/A   Occupational History  . Not on file.   Social History Main Topics  . Smoking status: Current Every Day Smoker -- 0.50 packs/day for 40 years    Types: Cigarettes  . Smokeless tobacco: Not on file     Comment: electronic cigarettes x 2 weeks. She was smoking 7 cigarettes a day x 40 yrs  . Alcohol Use: Yes     Comment: rarely  . Drug Use: No  . Sexual Activity: Not on file   Other Topics Concern  . Not on file   Social History Narrative  . No narrative on file   No family history on file. Current Outpatient Prescriptions on File Prior to Visit  Medication Sig Dispense Refill  . Biotin 1 MG CAPS Take by mouth daily.      . Calcium-Vitamin D-Vitamin K (CALCIUM + D + K PO) Take by mouth 2 (two) times daily before a meal.      . cholecalciferol (VITAMIN D) 1000 UNITS tablet Take 1,000 Units by mouth daily.      . fish oil-omega-3 fatty acids 1000 MG  capsule Take 1 g by mouth 2 (two) times daily before a meal.      . Garlic 5366 MG CAPS Take by mouth daily.      Marland Kitchen losartan-hydrochlorothiazide (HYZAAR) 100-25 MG per tablet Take 1 tablet by mouth daily.  90 tablet  1  . Lysine 500 MG TABS Take by mouth 2 (two) times daily before a meal.      . ondansetron (ZOFRAN) 4 MG tablet Take 1 tablet (4 mg total) by mouth every 8 (eight) hours as needed for nausea or vomiting.  20 tablet  0  . ranitidine (ZANTAC) 150 MG tablet Take 1 tablet (150 mg total) by mouth 2 (two) times daily.  180 tablet  1  . rosuvastatin (CRESTOR) 20 MG tablet Take 1 tablet (20 mg total) by mouth daily.  90 tablet  3  . traZODone (DESYREL) 50 MG tablet Take 1 tablet (50 mg total) by mouth at bedtime.  90 tablet  1   No current facility-administered medications on file prior to visit.   Allergies  Allergen Reactions  . Benadryl [Diphenhydramine Hcl]   .  Penicillins    Immunization History  Administered Date(s) Administered  . Influenza Split 07/28/2013  . Tdap 06/29/2013   Prior to Admission medications   Medication Sig Start Date End Date Taking? Authorizing Provider  Biotin 1 MG CAPS Take by mouth daily.   Yes Historical Provider, MD  Calcium-Vitamin D-Vitamin K (CALCIUM + D + K PO) Take by mouth 2 (two) times daily before a meal.   Yes Historical Provider, MD  cholecalciferol (VITAMIN D) 1000 UNITS tablet Take 1,000 Units by mouth daily.   Yes Historical Provider, MD  fish oil-omega-3 fatty acids 1000 MG capsule Take 1 g by mouth 2 (two) times daily before a meal.   Yes Historical Provider, MD  Garlic 6045 MG CAPS Take by mouth daily.   Yes Historical Provider, MD  losartan-hydrochlorothiazide (HYZAAR) 100-25 MG per tablet Take 1 tablet by mouth daily. 10/06/13  Yes Mary-Margaret Hassell Done, FNP  Lysine 500 MG TABS Take by mouth 2 (two) times daily before a meal.   Yes Historical Provider, MD  ondansetron (ZOFRAN) 4 MG tablet Take 1 tablet (4 mg total) by mouth every 8  (eight) hours as needed for nausea or vomiting. 11/09/13  Yes Mae Loree Fee, FNP  ranitidine (ZANTAC) 150 MG tablet Take 1 tablet (150 mg total) by mouth 2 (two) times daily. 10/06/13  Yes Mary-Margaret Hassell Done, FNP  rosuvastatin (CRESTOR) 20 MG tablet Take 1 tablet (20 mg total) by mouth daily. 09/23/13  Yes Mary-Margaret Hassell Done, FNP  traZODone (DESYREL) 50 MG tablet Take 1 tablet (50 mg total) by mouth at bedtime. 10/06/13  Yes Mary-Margaret Hassell Done, FNP     ROS: As above in the HPI. All other systems are stable or negative.  OBJECTIVE: APPEARANCE:  Patient in no acute distress.The patient appeared well nourished and normally developed. Acyanotic. Waist: VITAL SIGNS:BP 133/73  Pulse 60  Temp(Src) 99 F (37.2 C) (Oral)  Ht 5\' 2"  (1.575 m)  Wt 192 lb 3.2 oz (87.181 kg)  BMI 35.14 kg/m2 WF Obese  SKIN: warm and  Dry without overt rashes, tattoos and scars  HEAD and Neck: without JVD, Head and scalp: normal Eyes:No scleral icterus. Fundi normal, eye movements normal. Ears: Auricle normal, canal normal, Tympanic membranes normal, insufflation normal. Nose: normal Throat: normal Neck & thyroid: normal  CHEST & LUNGS: Chest wall: normal Lungs: Clear  CVS: Reveals the PMI to be normally located. Regular rhythm, First and Second Heart sounds are normal,  absence of murmurs, rubs or gallops. Peripheral vasculature: Radial pulses: normal Dorsal pedis pulses: normal Posterior pulses: normal  ABDOMEN:  Appearance: obese Benign, no organomegaly, no masses, no Abdominal Aortic enlargement. No Guarding , no rebound. No Bruits. Bowel sounds: normal  RECTAL: N/A GU: N/A  EXTREMETIES: nonedematous.  MUSCULOSKELETAL:  Spine: normal Joints: intact: left is intact Right knee soft tissue swelling over the patella and multiple abrasions. No crepitus. Joint intact. The areas of the abrasions are red and warmth. No drainage. NO purulence. It is scabbed over.   NEUROLOGIC:  oriented to time,place and person; nonfocal. Strength is normal Sensory is normal Reflexes are normal Cranial Nerves are normal.  ASSESSMENT: Fall - Plan: DG Knee 1-2 Views Right  Abrasion of knee, right - Plan: mupirocin ointment (BACTROBAN) 2 %, sulfamethoxazole-trimethoprim (BACTRIM DS) 800-160 MG per tablet  Cellulitis of knee, right - Plan: mupirocin ointment (BACTROBAN) 2 %, sulfamethoxazole-trimethoprim (BACTRIM DS) 800-160 MG per tablet  PLAN:  Orders Placed This Encounter  Procedures  . DG Knee 1-2 Views Right  Standing Status: Future     Number of Occurrences: 1     Standing Expiration Date: 02/18/2015    Order Specific Question:  Reason for Exam (SYMPTOM  OR DIAGNOSIS REQUIRED)    Answer:  FALL    Order Specific Question:  Preferred imaging location?    Answer:  Internal  [facility] reading (PRIMARY) by  Dr. Jacelyn Grip: no fracture seen.                                Meds ordered this encounter  Medications  . mupirocin ointment (BACTROBAN) 2 %    Sig: Place 1 application into the nose 2 (two) times daily.    Dispense:  22 g    Refill:  0  . sulfamethoxazole-trimethoprim (BACTRIM DS) 800-160 MG per tablet    Sig: Take 1 tablet by mouth 2 (two) times daily.    Dispense:  20 tablet    Refill:  0  wound care. Clean with soap and water.   There are no discontinued medications. Return if symptoms worsen or fail to improve. if not improving over the next 2 to 3 days or anytime if it gets worse or stat to the ED if  fever or other problems.  Clements Toro P. Jacelyn Grip, M.D.

## 2013-12-19 NOTE — Patient Instructions (Signed)
Abrasion An abrasion is a cut or scrape of the skin. Abrasions do not extend through all layers of the skin and most heal within 10 days. It is important to care for your abrasion properly to prevent infection. CAUSES  Most abrasions are caused by falling on, or gliding across, the ground or other surface. When your skin rubs on something, the outer and inner layer of skin rubs off, causing an abrasion. DIAGNOSIS  Your caregiver will be able to diagnose an abrasion during a physical exam.  TREATMENT  Your treatment depends on how large and deep the abrasion is. Generally, your abrasion will be cleaned with water and a mild soap to remove any dirt or debris. An antibiotic ointment may be put over the abrasion to prevent an infection. A bandage (dressing) may be wrapped around the abrasion to keep it from getting dirty.  You may need a tetanus shot if:  You cannot remember when you had your last tetanus shot.  You have never had a tetanus shot.  The injury broke your skin. If you get a tetanus shot, your arm may swell, get red, and feel warm to the touch. This is common and not a problem. If you need a tetanus shot and you choose not to have one, there is a rare chance of getting tetanus. Sickness from tetanus can be serious.  HOME CARE INSTRUCTIONS   If a dressing was applied, change it at least once a day or as directed by your caregiver. If the bandage sticks, soak it off with warm water.   Wash the area with water and a mild soap to remove all the ointment 2 times a day. Rinse off the soap and pat the area dry with a clean towel.   Reapply any ointment as directed by your caregiver. This will help prevent infection and keep the bandage from sticking. Use gauze over the wound and under the dressing to help keep the bandage from sticking.   Change your dressing right away if it becomes wet or dirty.   Only take over-the-counter or prescription medicines for pain, discomfort, or fever as  directed by your caregiver.   Follow up with your caregiver within 24 48 hours for a wound check, or as directed. If you were not given a wound-check appointment, look closely at your abrasion for redness, swelling, or pus. These are signs of infection. SEEK IMMEDIATE MEDICAL CARE IF:   You have increasing pain in the wound.   You have redness, swelling, or tenderness around the wound.   You have pus coming from the wound.   You have a fever or persistent symptoms for more than 2 3 days.  You have a fever and your symptoms suddenly get worse.  You have a bad smell coming from the wound or dressing.  MAKE SURE YOU:   Understand these instructions.  Will watch your condition.  Will get help right away if you are not doing well or get worse. Document Released: 08/13/2005 Document Revised: 10/20/2012 Document Reviewed: 10/07/2011 [facility] Patient Information 2014 [place], [place].   Cellulitis Cellulitis is an infection of the skin and the tissue beneath it. The infected area is usually red and tender. Cellulitis occurs most often in the arms and lower legs.  CAUSES  Cellulitis is caused by bacteria that enter the skin through cracks or cuts in the skin. The most common types of bacteria that cause cellulitis are Staphylococcus and Streptococcus. SYMPTOMS   Redness and warmth.  Swelling.  Tenderness or pain.  Fever. DIAGNOSIS  Your caregiver can usually determine what is wrong based on a physical exam. Blood tests may also be done. TREATMENT  Treatment usually involves taking an antibiotic medicine. HOME CARE INSTRUCTIONS   Take your antibiotics as directed. Finish them even if you start to feel better.  Keep the infected arm or leg elevated to reduce swelling.  Apply a warm cloth to the affected area up to 4 times per day to relieve pain.  Only take over-the-counter or prescription medicines for pain, discomfort, or fever as directed by your caregiver.  Keep all  follow-up appointments as directed by your caregiver. SEEK MEDICAL CARE IF:   You notice red streaks coming from the infected area.  Your red area gets larger or turns dark in color.  Your bone or joint underneath the infected area becomes painful after the skin has healed.  Your infection returns in the same area or another area.  You notice a swollen bump in the infected area.  You develop new symptoms. SEEK IMMEDIATE MEDICAL CARE IF:   You have a fever.  You feel very sleepy.  You develop vomiting or diarrhea.  You have a general ill feeling (malaise) with muscle aches and pains. MAKE SURE YOU:   Understand these instructions.  Will watch your condition.  Will get help right away if you are not doing well or get worse. Document Released: 08/13/2005 Document Revised: 05/04/2012 Document Reviewed: 01/19/2012 [facility] Patient Information 2014 [place].

## 2013-12-19 NOTE — Telephone Encounter (Signed)
Pt aware this was a typo and thie ointmen t should go on her knee

## 2014-01-17 ENCOUNTER — Encounter: Payer: Self-pay | Admitting: Nurse Practitioner

## 2014-01-17 ENCOUNTER — Ambulatory Visit (INDEPENDENT_AMBULATORY_CARE_PROVIDER_SITE_OTHER): Payer: Medicare Other | Admitting: Nurse Practitioner

## 2014-01-17 VITALS — BP 167/89 | HR 54 | Temp 97.0°F | Ht 62.0 in | Wt 192.0 lb

## 2014-01-17 DIAGNOSIS — E785 Hyperlipidemia, unspecified: Secondary | ICD-10-CM

## 2014-01-17 DIAGNOSIS — I10 Essential (primary) hypertension: Secondary | ICD-10-CM

## 2014-01-17 DIAGNOSIS — M949 Disorder of cartilage, unspecified: Secondary | ICD-10-CM

## 2014-01-17 DIAGNOSIS — M8580 Other specified disorders of bone density and structure, unspecified site: Secondary | ICD-10-CM

## 2014-01-17 DIAGNOSIS — K5732 Diverticulitis of large intestine without perforation or abscess without bleeding: Secondary | ICD-10-CM

## 2014-01-17 DIAGNOSIS — F411 Generalized anxiety disorder: Secondary | ICD-10-CM

## 2014-01-17 DIAGNOSIS — K219 Gastro-esophageal reflux disease without esophagitis: Secondary | ICD-10-CM

## 2014-01-17 DIAGNOSIS — M899 Disorder of bone, unspecified: Secondary | ICD-10-CM

## 2014-01-17 DIAGNOSIS — G4700 Insomnia, unspecified: Secondary | ICD-10-CM

## 2014-01-17 NOTE — Progress Notes (Signed)
  Subjective:    Patient ID: Renee Maldonado, female    DOB: [birth year], 68 y.o.   MRN: [id number]  Patient here today for follow up . Doing well today without complaints- At last visit patient was suppose to have stared evista but she read side affects and was afraid to start. We spent some time discussing it and I encouraged her to take it. Patient still refuses to take.  Hypertension This is a chronic problem. The current episode started more than 1 year ago. The problem is unchanged. The problem is controlled. Pertinent negatives include no blurred vision, chest pain, headaches, neck pain, orthopnea, palpitations, peripheral edema, PND, shortness of breath or sweats. There are no associated agents to hypertension. Risk factors for coronary artery disease include dyslipidemia, obesity and post-menopausal state. Past treatments include angiotensin blockers and diuretics. The current treatment provides moderate improvement. Compliance problems include diet and exercise.   Hyperlipidemia This is a chronic problem. The current episode started more than 1 year ago. The problem is uncontrolled. Recent lipid tests were reviewed and are high. Exacerbating diseases include obesity. She has no history of diabetes, hypothyroidism or liver disease. Factors aggravating her hyperlipidemia include thiazides. Pertinent negatives include no chest pain or shortness of breath. Current antihyperlipidemic treatment includes statins (recently changed from lipitor to crestor). There are no compliance problems.  Risk factors for coronary artery disease include hypertension, family history, obesity and post-menopausal.  GERD Currently trying diet control Ostenpenia Not on any meds- is trying weight bearing exercsie but doesn't do daily.Last dexa scan was 11/20/10. Insomnia Trazadone nightly- rest well Diverticulitis Patient tries to watch diet- no recent flare ups.   Review of Systems  Eyes: Negative for blurred vision.   Respiratory: Negative for shortness of breath.   Cardiovascular: Negative for chest pain, palpitations, orthopnea and PND.  Musculoskeletal: Negative for neck pain.  Neurological: Negative for headaches.  All other systems reviewed and are negative.       Objective:   Physical Exam  Constitutional: She is oriented to person, place, and time. She appears well-developed and well-nourished.  HENT:  Nose: Nose normal.  Mouth/Throat: Oropharynx is clear and moist.  Eyes: EOM are normal.  Neck: Trachea normal, normal range of motion and full passive range of motion without pain. Neck supple. No JVD present. Carotid bruit is not present. No thyromegaly present.  Cardiovascular: Normal rate, regular rhythm, normal heart sounds and intact distal pulses.  Exam reveals no gallop and no friction rub.   No murmur heard. Pulmonary/Chest: Effort normal and breath sounds normal.  Abdominal: Soft. Bowel sounds are normal. She exhibits no distension and no mass. There is no tenderness.  Musculoskeletal: Normal range of motion.  Lymphadenopathy:    She has no cervical adenopathy.  Neurological: She is alert and oriented to person, place, and time. She has normal reflexes.  Skin: Skin is warm and dry.  Psychiatric: She has a normal mood and affect. Her behavior is normal. Judgment and thought content normal.  BP 167/89  Pulse 54  Temp(Src) 97 F (36.1 C) (Oral)  Ht 5\' 2"  (1.575 m)  Wt 192 lb (87.091 kg)  BMI 35.11 kg/m2         Assessment & Plan:

## 2014-01-17 NOTE — Patient Instructions (Signed)

## 2014-01-19 LAB — CMP14+EGFR
ALBUMIN: 4.5 g/dL (ref 3.6–4.8)
ALK PHOS: 90 IU/L (ref 39–117)
ALT: 29 IU/L (ref 0–32)
AST: 22 IU/L (ref 0–40)
Albumin/Globulin Ratio: 2.5 (ref 1.1–2.5)
BILIRUBIN TOTAL: 0.4 mg/dL (ref 0.0–1.2)
BUN/Creatinine Ratio: 17 (ref 11–26)
BUN: 23 mg/dL (ref 8–27)
CO2: 26 mmol/L (ref 18–29)
CREATININE: 1.33 mg/dL — AB (ref 0.57–1.00)
Calcium: 10.1 mg/dL (ref 8.7–10.3)
Chloride: 100 mmol/L (ref 97–108)
GFR calc Af Amer: 47 mL/min/{1.73_m2} — ABNORMAL LOW (ref >59)
GFR, EST NON AFRICAN AMERICAN: 41 mL/min/{1.73_m2} — AB (ref >59)
GLOBULIN, TOTAL: 1.8 g/dL (ref 1.5–4.5)
Glucose: 111 mg/dL — ABNORMAL HIGH (ref 65–99)
POTASSIUM: 4.9 mmol/L (ref 3.5–5.2)
Sodium: 141 mmol/L (ref 134–144)
Total Protein: 6.3 g/dL (ref 6.0–8.5)

## 2014-01-19 LAB — NMR, LIPOPROFILE
Cholesterol: 131 mg/dL (ref <200)
HDL Cholesterol by NMR: 48 mg/dL (ref >=40)
HDL PARTICLE NUMBER: 33.2 umol/L (ref >=30.5)
LDL PARTICLE NUMBER: 780 nmol/L (ref <1000)
LDL Size: 20.5 nm — ABNORMAL LOW (ref >20.5)
LDLC SERPL CALC-MCNC: 61 mg/dL (ref <100)
LP-IR SCORE: 47 — AB (ref <=45)
SMALL LDL PARTICLE NUMBER: 397 nmol/L (ref <=527)
TRIGLYCERIDES BY NMR: 108 mg/dL (ref <150)

## 2014-01-20 ENCOUNTER — Telehealth: Payer: Self-pay | Admitting: *Deleted

## 2014-01-20 NOTE — Telephone Encounter (Signed)
Message copied by Shelbie Ammons on Fri Jan 20, 2014  4:09 PM ------      Message from: Chevis Pretty      Created: Thu Jan 19, 2014 10:55 AM       kidney function stable but creatine is increasing      Cholesterol looks great      Continue current meds- low fat diet and exercise and recheck in 3 months             ------

## 2014-01-20 NOTE — Telephone Encounter (Signed)
Husband will relay results.

## 2014-01-23 ENCOUNTER — Telehealth: Payer: Self-pay | Admitting: Nurse Practitioner

## 2014-01-23 ENCOUNTER — Other Ambulatory Visit: Payer: Self-pay | Admitting: Nurse Practitioner

## 2014-01-23 MED ORDER — BENZONATATE 100 MG PO CAPS: 100.0000 mg | ORAL_CAPSULE | Freq: Two times a day (BID) | ORAL | Status: DC | PRN

## 2014-01-23 NOTE — Telephone Encounter (Signed)
Pt notified and states no fever

## 2014-01-23 NOTE — Telephone Encounter (Signed)
ntbs for antibiotic- if doesn't have fever doesn't need antibiotic- tessalon perles sent to pharmacy

## 2014-04-17 ENCOUNTER — Other Ambulatory Visit: Payer: Self-pay | Admitting: Family Medicine

## 2014-04-17 ENCOUNTER — Ambulatory Visit (INDEPENDENT_AMBULATORY_CARE_PROVIDER_SITE_OTHER): Payer: Medicare Other

## 2014-04-17 ENCOUNTER — Encounter: Payer: Self-pay | Admitting: Family Medicine

## 2014-04-17 ENCOUNTER — Ambulatory Visit (INDEPENDENT_AMBULATORY_CARE_PROVIDER_SITE_OTHER): Payer: Medicare Other | Admitting: Family Medicine

## 2014-04-17 VITALS — BP 137/70 | HR 67 | Temp 98.9°F | Ht 62.0 in | Wt 199.0 lb

## 2014-04-17 DIAGNOSIS — J209 Acute bronchitis, unspecified: Secondary | ICD-10-CM

## 2014-04-17 DIAGNOSIS — Z87891 Personal history of nicotine dependence: Secondary | ICD-10-CM

## 2014-04-17 MED ORDER — LEVALBUTEROL HCL 1.25 MG/0.5ML IN NEBU: 1.2500 mg | INHALATION_SOLUTION | Freq: Once | RESPIRATORY_TRACT | Status: DC

## 2014-04-17 MED ORDER — LEVOFLOXACIN 500 MG PO TABS: 500.0000 mg | ORAL_TABLET | Freq: Every day | ORAL | Status: DC

## 2014-04-17 MED ORDER — METHYLPREDNISOLONE (PAK) 4 MG PO TABS: ORAL_TABLET | ORAL | Status: DC

## 2014-04-17 MED ORDER — LEVALBUTEROL HCL 1.25 MG/3ML IN NEBU
1.2500 mg | INHALATION_SOLUTION | Freq: Once | RESPIRATORY_TRACT | Status: AC
  Administered 2014-04-17: 1.25 mg via RESPIRATORY_TRACT

## 2014-04-17 MED ORDER — ALBUTEROL SULFATE HFA 108 (90 BASE) MCG/ACT IN AERS: 2.0000 | INHALATION_SPRAY | Freq: Four times a day (QID) | RESPIRATORY_TRACT | Status: DC | PRN

## 2014-04-17 MED ORDER — HYDROCODONE-HOMATROPINE 5-1.5 MG/5ML PO SYRP: 5.0000 mL | ORAL_SOLUTION | Freq: Three times a day (TID) | ORAL | Status: DC | PRN

## 2014-04-17 NOTE — Progress Notes (Signed)
   Subjective:    Patient ID: Renee Maldonado, female    DOB: June 03, 1946, 68 y.o.   MRN: [id number]  HPI This 68 y.o. female presents for evaluation of cough and uri sx's for over a week.  She has been taking otc medicine and she had some clindamycin and started taking this for the last few days.  She is wheezing a lot and having difficulty sleeping at hs due to cough.  She has long hx of smoking.   Review of Systems C/o cough, and URI sx's   No chest pain, SOB, HA, dizziness, vision change, N/V, diarrhea, constipation, dysuria, urinary urgency or frequency, myalgias, arthralgias or rash.  Objective:   Physical Exam Vital signs noted  Well developed well nourished female.  HEENT - Head atraumatic Normocephalic                Eyes - PERRLA, Conjuctiva - clear Sclera- Clear EOMI                Ears - EAC's Wnl TM's Wnl Gross Hearing WNL                Nose - Nares patent                 Throat - oropharanx wnl Respiratory - Lungs diminished with diffuse wheezes scattered throughout.  Better air exchange with wheezes scattered after neb tx Cardiac - RRR S1 and S2 without murmur GI - Abdomen soft Nontender and bowel sounds active x 4  CXR - Hyperinflation, possible left lower lobe infiltrate Prelimnary reading by Gwyndolyn Saxon [place],FNP     Assessment & Plan:  Acute bronchitis - Plan: levofloxacin (LEVAQUIN) 500 MG tablet, methylPREDNIsolone (MEDROL DOSPACK) 4 MG tablet, HYDROcodone-homatropine (HYCODAN) 5-1.5 MG/5ML syrup, albuterol (PROVENTIL HFA;VENTOLIN HFA) 108 (90 BASE) MCG/ACT inhaler, levalbuterol (XOPENEX) nebulizer solution 1.25 mg, DG Chest 2 View, levalbuterol (XOPENEX) nebulizer solution 1.25 mg  Hx of tobacco use, presenting hazards to health - Plan: DG Chest 2   Mucinex otc Push po fluids, rest, tylenol and motrin otc prn as directed for fever, arthralgias, and myalgias.  Follow up prn if sx's continue or persist.  Follow up in 2 weeks.  Lysbeth Penner FNP

## 2014-04-24 ENCOUNTER — Ambulatory Visit: Payer: Medicare Other | Admitting: Family Medicine

## 2014-04-26 ENCOUNTER — Ambulatory Visit (INDEPENDENT_AMBULATORY_CARE_PROVIDER_SITE_OTHER): Payer: Medicare Other | Admitting: Family Medicine

## 2014-04-26 VITALS — HR 40 | Temp 97.1°F | Wt 196.8 lb

## 2014-04-26 DIAGNOSIS — I498 Other specified cardiac arrhythmias: Secondary | ICD-10-CM

## 2014-04-26 DIAGNOSIS — R5383 Other fatigue: Secondary | ICD-10-CM

## 2014-04-26 DIAGNOSIS — R001 Bradycardia, unspecified: Secondary | ICD-10-CM

## 2014-04-26 DIAGNOSIS — J189 Pneumonia, unspecified organism: Secondary | ICD-10-CM

## 2014-04-26 DIAGNOSIS — R5381 Other malaise: Secondary | ICD-10-CM

## 2014-04-26 LAB — POCT CBC
Granulocyte percent: 70.2 %G (ref 37–80)
HCT, POC: 40.7 % (ref 37.7–47.9)
Hemoglobin: 13.1 g/dL (ref 12.2–16.2)
Lymph, poc: 1.5 (ref 0.6–3.4)
MCH, POC: 26.9 pg — AB (ref 27–31.2)
MCHC: 32.2 g/dL (ref 31.8–35.4)
MCV: 83.6 fL (ref 80–97)
MPV: 8.9 fL (ref 0–99.8)
POC Granulocyte: 4.5 (ref 2–6.9)
POC LYMPH PERCENT: 24.2 %L (ref 10–50)
Platelet Count, POC: 209 10*3/uL (ref 142–424)
RBC: 4.9 M/uL (ref 4.04–5.48)
RDW, POC: 13.6 %
WBC: 6.4 10*3/uL (ref 4.6–10.2)

## 2014-04-26 NOTE — Progress Notes (Signed)
   Subjective:    Patient ID: Renee Maldonado, female    DOB: 12-20-1945, 68 y.o.   MRN: [id number]  HPI This 68 y.o. female presents for evaluation of follow up on pneumonia.  She has finished her abx's and prednisone taper and feels a lot better.  She is c/o fatigue and she is wondering if she has low vitamin D and B12.   Review of Systems C/o fatigue No chest pain, SOB, HA, dizziness, vision change, N/V, diarrhea, constipation, dysuria, urinary urgency or frequency, myalgias, arthralgias or rash.     Objective:   Physical Exam  Vital signs noted  Well developed well nourished female.  HEENT - Head atraumatic Normocephalic                Eyes - PERRLA, Conjuctiva - clear Sclera- Clear EOMI                Ears - EAC's Wnl TM's Wnl Gross Hearing WNL                Throat - oropharanx wnl Respiratory - Lungs CTA bilateral Cardiac - RRR S1 and S2 without murmur GI - Abdomen soft Nontender and bowel sounds active x 4 Extremities - No edema. Neuro - Grossly intact.  EKG - SB without acute ST-T changes. Starleen Arms reading by Iverson Alamin    Assessment & Plan:  Bradycardia - Plan: EKG 12-Lead.  Recommend to follow for now.  May need 24 hour holter to check for any decreased variability or possible sinus node dysfunction.    Other malaise and fatigue - Her heart rate was 57 on EKG and her bp is 124/70 and she is hemodynamically stable so do not suspect any cardiac etiology.  Will check b12 and vitamin D, cbc, and bmp.  Discussed that this is also due to her pneumonia.  Discussed that deconditioning is also a probability.  Pneumonia - Resolving and recommend follow up cxr in 2-3 months  Lysbeth Penner FNP

## 2014-04-27 LAB — BMP8+EGFR
BUN/Creatinine Ratio: 22 (ref 11–26)
BUN: 28 mg/dL — ABNORMAL HIGH (ref 8–27)
CO2: 25 mmol/L (ref 18–29)
Calcium: 9.7 mg/dL (ref 8.7–10.3)
Chloride: 97 mmol/L (ref 97–108)
Creatinine, Ser: 1.29 mg/dL — ABNORMAL HIGH (ref 0.57–1.00)
GFR calc Af Amer: 49 mL/min/{1.73_m2} — ABNORMAL LOW (ref >59)
GFR calc non Af Amer: 43 mL/min/{1.73_m2} — ABNORMAL LOW (ref >59)
Glucose: 97 mg/dL (ref 65–99)
Potassium: 4 mmol/L (ref 3.5–5.2)
Sodium: 138 mmol/L (ref 134–144)

## 2014-04-27 LAB — VITAMIN D 25 HYDROXY (VIT D DEFICIENCY, FRACTURES): Vit D, 25-Hydroxy: 40.2 ng/mL (ref 30.0–100.0)

## 2014-04-27 LAB — VITAMIN B12: Vitamin B-12: 491 pg/mL (ref 211–946)

## 2014-05-17 ENCOUNTER — Ambulatory Visit (INDEPENDENT_AMBULATORY_CARE_PROVIDER_SITE_OTHER): Payer: Medicare Other | Admitting: Nurse Practitioner

## 2014-05-17 ENCOUNTER — Encounter: Payer: Self-pay | Admitting: Nurse Practitioner

## 2014-05-17 VITALS — BP 106/72 | HR 80 | Temp 99.3°F | Ht 62.0 in | Wt 195.8 lb

## 2014-05-17 DIAGNOSIS — M8580 Other specified disorders of bone density and structure, unspecified site: Secondary | ICD-10-CM

## 2014-05-17 DIAGNOSIS — F411 Generalized anxiety disorder: Secondary | ICD-10-CM

## 2014-05-17 DIAGNOSIS — G4700 Insomnia, unspecified: Secondary | ICD-10-CM

## 2014-05-17 DIAGNOSIS — K219 Gastro-esophageal reflux disease without esophagitis: Secondary | ICD-10-CM

## 2014-05-17 DIAGNOSIS — M949 Disorder of cartilage, unspecified: Secondary | ICD-10-CM

## 2014-05-17 DIAGNOSIS — M899 Disorder of bone, unspecified: Secondary | ICD-10-CM

## 2014-05-17 DIAGNOSIS — I10 Essential (primary) hypertension: Secondary | ICD-10-CM

## 2014-05-17 DIAGNOSIS — E785 Hyperlipidemia, unspecified: Secondary | ICD-10-CM

## 2014-05-17 MED ORDER — ROSUVASTATIN CALCIUM 20 MG PO TABS: 20.0000 mg | ORAL_TABLET | Freq: Every day | ORAL | Status: DC

## 2014-05-17 MED ORDER — RANITIDINE HCL 150 MG PO TABS: 150.0000 mg | ORAL_TABLET | Freq: Two times a day (BID) | ORAL | Status: DC

## 2014-05-17 MED ORDER — RALOXIFENE HCL 60 MG PO TABS: 60.0000 mg | ORAL_TABLET | Freq: Every day | ORAL | Status: DC

## 2014-05-17 MED ORDER — LOSARTAN POTASSIUM-HCTZ 100-25 MG PO TABS: 1.0000 | ORAL_TABLET | Freq: Every day | ORAL | Status: DC

## 2014-05-17 MED ORDER — TRAZODONE HCL 50 MG PO TABS: 50.0000 mg | ORAL_TABLET | Freq: Every day | ORAL | Status: DC

## 2014-05-17 NOTE — Progress Notes (Signed)
Subjective:    Patient ID: Renee Maldonado, female    DOB: 1946/06/11, 68 y.o.   MRN: [id number]  Patient here today for follow up . Doing well today without complaints-   Hypertension This is a chronic problem. The current episode started more than 1 year ago. The problem is unchanged. The problem is controlled. Pertinent negatives include no blurred vision, chest pain, headaches, neck pain, orthopnea, palpitations, peripheral edema, PND, shortness of breath or sweats. There are no associated agents to hypertension. Risk factors for coronary artery disease include dyslipidemia, obesity and post-menopausal state. Past treatments include angiotensin blockers and diuretics. The current treatment provides moderate improvement. Compliance problems include diet and exercise.   Hyperlipidemia This is a chronic problem. The current episode started more than 1 year ago. The problem is uncontrolled. Recent lipid tests were reviewed and are high. Exacerbating diseases include obesity. She has no history of diabetes, hypothyroidism or liver disease. Factors aggravating her hyperlipidemia include thiazides. Pertinent negatives include no chest pain or shortness of breath. Current antihyperlipidemic treatment includes statins (recently changed from lipitor to crestor). There are no compliance problems.  Risk factors for coronary artery disease include hypertension, family history, obesity and post-menopausal.  GERD Currently trying diet control Ostenpenia Not on any meds- is trying weight bearing exercsie but doesn't do daily.Last dexa scan was 11/20/10. Insomnia Trazadone nightly- rest well Diverticulitis Patient tries to watch diet- no recent flare ups.   Review of Systems  Eyes: Negative for blurred vision.  Respiratory: Negative for shortness of breath.   Cardiovascular: Negative for chest pain, palpitations, orthopnea and PND.  Musculoskeletal: Negative for neck pain.  Neurological: Negative for  headaches.  All other systems reviewed and are negative.      Objective:   Physical Exam  Constitutional: She is oriented to person, place, and time. She appears well-developed and well-nourished.  HENT:  Nose: Nose normal.  Mouth/Throat: Oropharynx is clear and moist.  Eyes: EOM are normal.  Neck: Trachea normal, normal range of motion and full passive range of motion without pain. Neck supple. No JVD present. Carotid bruit is not present. No thyromegaly present.  Cardiovascular: Normal rate, regular rhythm, normal heart sounds and intact distal pulses.  Exam reveals no gallop and no friction rub.   No murmur heard. Pulmonary/Chest: Effort normal and breath sounds normal.  Abdominal: Soft. Bowel sounds are normal. She exhibits no distension and no mass. There is no tenderness.  Musculoskeletal: Normal range of motion.  Lymphadenopathy:    She has no cervical adenopathy.  Neurological: She is alert and oriented to person, place, and time. She has normal reflexes.  Skin: Skin is warm and dry.  Psychiatric: She has a normal mood and affect. Her behavior is normal. Judgment and thought content normal.     BP 106/72  Pulse 80  Temp(Src) 99.3 F (37.4 C) (Oral)  Ht '5\' 2"'  (1.575 m)  Wt 195 lb 12.8 oz (88.814 kg)  BMI 35.80 kg/m2       Assessment & Plan:   1. Insomnia   2. Essential hypertension   3. Hyperlipidemia   4. Gastroesophageal reflux disease without esophagitis   5. GAD (generalized anxiety disorder)   6. Osteopenia    Orders Placed This Encounter  Procedures  . CMP14+EGFR  . NMR, lipoprofile   Meds ordered this encounter  Medications  . TURMERIC PO    Sig: Take by mouth.  . losartan-hydrochlorothiazide (HYZAAR) 100-25 MG per tablet    Sig: Take 1 tablet  by mouth daily.    Dispense:  90 tablet    Refill:  1    Order Specific Question:  Supervising Provider    Answer:  Chipper Herb [[id number]]  . traZODone (DESYREL) 50 MG tablet    Sig: Take 1 tablet (50  mg total) by mouth at bedtime.    Dispense:  90 tablet    Refill:  1    Order Specific Question:  Supervising Provider    Answer:  Chipper Herb [[id number]]  . rosuvastatin (CRESTOR) 20 MG tablet    Sig: Take 1 tablet (20 mg total) by mouth daily.    Dispense:  90 tablet    Refill:  1    Order Specific Question:  Supervising Provider    Answer:  Chipper Herb [[id number]]  . ranitidine (ZANTAC) 150 MG tablet    Sig: Take 1 tablet (150 mg total) by mouth 2 (two) times daily.    Dispense:  180 tablet    Refill:  1    Order Specific Question:  Supervising Provider    Answer:  Chipper Herb [[id number]]  . raloxifene (EVISTA) 60 MG tablet    Sig: Take 1 tablet (60 mg total) by mouth daily.    Dispense:  90 tablet    Refill:  1    Order Specific Question:  Supervising Provider    Answer:  Chipper Herb [[id number]]     Labs pending Health maintenance reviewed Diet and exercise encouraged Continue all meds Follow up  In 3 month   [place], FNP

## 2014-05-17 NOTE — Patient Instructions (Signed)
Hypertension Hypertension, commonly called high blood pressure, is when the force of blood pumping through your arteries is too strong. Your arteries are the blood vessels that carry blood from your heart throughout your body. A blood pressure reading consists of a higher number over a lower number, such as 110/72. The higher number (systolic) is the pressure inside your arteries when your heart pumps. The lower number (diastolic) is the pressure inside your arteries when your heart relaxes. Ideally you want your blood pressure below 120/80. Hypertension forces your heart to work harder to pump blood. Your arteries may become narrow or stiff. Having hypertension puts you at risk for heart disease, stroke, and other problems.  RISK FACTORS Some risk factors for high blood pressure are controllable. Others are not.  Risk factors you cannot control include:   Race. You may be at higher risk if you are African American.  Age. Risk increases with age.  Gender. Men are at higher risk than women before age 45 years. After age 65, women are at higher risk than men. Risk factors you can control include:  Not getting enough exercise or physical activity.  Being overweight.  Getting too much fat, sugar, calories, or salt in your diet.  Drinking too much alcohol. SIGNS AND SYMPTOMS Hypertension does not usually cause signs or symptoms. Extremely high blood pressure (hypertensive crisis) may cause headache, anxiety, shortness of breath, and nosebleed. DIAGNOSIS  To check if you have hypertension, your health care provider will measure your blood pressure while you are seated, with your arm held at the level of your heart. It should be measured at least twice using the same arm. Certain conditions can cause a difference in blood pressure between your right and left arms. A blood pressure reading that is higher than normal on one occasion does not mean that you need treatment. If one blood pressure reading  is high, ask your health care provider about having it checked again. TREATMENT  Treating high blood pressure includes making lifestyle changes and possibly taking medication. Living a healthy lifestyle can help lower high blood pressure. You may need to change some of your habits. Lifestyle changes may include:  Following the DASH diet. This diet is high in fruits, vegetables, and whole grains. It is low in salt, red meat, and added sugars.  Getting at least 2 1/2 hours of brisk physical activity every week.  Losing weight if necessary.  Not smoking.  Limiting alcoholic beverages.  Learning ways to reduce stress. If lifestyle changes are not enough to get your blood pressure under control, your health care provider may prescribe medicine. You may need to take more than one. Work closely with your health care provider to understand the risks and benefits. HOME CARE INSTRUCTIONS  Have your blood pressure rechecked as directed by your health care provider.   Only take medicine as directed by your health care provider. Follow the directions carefully. Blood pressure medicines must be taken as prescribed. The medicine does not work as well when you skip doses. Skipping doses also puts you at risk for problems.   Do not smoke.   Monitor your blood pressure at home as directed by your health care provider. SEEK MEDICAL CARE IF:   You think you are having a reaction to medicines taken.  You have recurrent headaches or feel dizzy.  You have swelling in your ankles.  You have trouble with your vision. SEEK IMMEDIATE MEDICAL CARE IF:  You develop a severe headache or   confusion.  You have unusual weakness, numbness, or feel faint.  You have severe chest or abdominal pain.  You vomit repeatedly.  You have trouble breathing. MAKE SURE YOU:   Understand these instructions.  Will watch your condition.  Will get help right away if you are not doing well or get  worse. Document Released: 11/03/2005 Document Revised: 11/08/2013 Document Reviewed: 08/26/2013 Exit[employer] Patient Information 2015 [employer]. This information is not intended to replace advice given to you by your health care provider. Make sure you discuss any questions you have with your health care provider.  

## 2014-05-18 LAB — CMP14+EGFR
A/G RATIO: 2.3 (ref 1.1–2.5)
ALBUMIN: 4.5 g/dL (ref 3.6–4.8)
ALT: 25 IU/L (ref 0–32)
AST: 22 IU/L (ref 0–40)
Alkaline Phosphatase: 76 IU/L (ref 39–117)
BILIRUBIN TOTAL: 0.7 mg/dL (ref 0.0–1.2)
BUN/Creatinine Ratio: 19 (ref 11–26)
BUN: 25 mg/dL (ref 8–27)
CALCIUM: 9.8 mg/dL (ref 8.7–10.3)
CO2: 25 mmol/L (ref 18–29)
Chloride: 99 mmol/L (ref 97–108)
Creatinine, Ser: 1.32 mg/dL — ABNORMAL HIGH (ref 0.57–1.00)
GFR calc Af Amer: 48 mL/min/{1.73_m2} — ABNORMAL LOW (ref >59)
GFR, EST NON AFRICAN AMERICAN: 41 mL/min/{1.73_m2} — AB (ref >59)
GLUCOSE: 95 mg/dL (ref 65–99)
Globulin, Total: 2 g/dL (ref 1.5–4.5)
POTASSIUM: 5 mmol/L (ref 3.5–5.2)
Sodium: 142 mmol/L (ref 134–144)
TOTAL PROTEIN: 6.5 g/dL (ref 6.0–8.5)

## 2014-05-18 LAB — NMR, LIPOPROFILE
CHOLESTEROL: 143 mg/dL (ref 100–199)
HDL CHOLESTEROL BY NMR: 39 mg/dL — AB (ref >39)
HDL Particle Number: 29.5 umol/L — ABNORMAL LOW (ref >=30.5)
LDL PARTICLE NUMBER: 881 nmol/L (ref <1000)
LDL SIZE: 20 nm (ref >20.5)
LDLC SERPL CALC-MCNC: 70 mg/dL (ref 0–99)
LP-IR Score: 56 — ABNORMAL HIGH (ref <=45)
Small LDL Particle Number: 530 nmol/L — ABNORMAL HIGH (ref <=527)
Triglycerides by NMR: 168 mg/dL — ABNORMAL HIGH (ref 0–149)

## 2014-08-11 ENCOUNTER — Encounter: Payer: Self-pay | Admitting: Nurse Practitioner

## 2014-08-11 ENCOUNTER — Ambulatory Visit (INDEPENDENT_AMBULATORY_CARE_PROVIDER_SITE_OTHER): Payer: Medicare Other | Admitting: Nurse Practitioner

## 2014-08-11 VITALS — BP 98/62 | Temp 97.9°F | Ht 62.0 in | Wt 199.0 lb

## 2014-08-11 DIAGNOSIS — IMO0001 Reserved for inherently not codable concepts without codable children: Secondary | ICD-10-CM

## 2014-08-11 DIAGNOSIS — J449 Chronic obstructive pulmonary disease, unspecified: Secondary | ICD-10-CM

## 2014-08-11 DIAGNOSIS — J441 Chronic obstructive pulmonary disease with (acute) exacerbation: Secondary | ICD-10-CM

## 2014-08-11 MED ORDER — BUDESONIDE-FORMOTEROL FUMARATE 80-4.5 MCG/ACT IN AERO: 2.0000 | INHALATION_SPRAY | Freq: Two times a day (BID) | RESPIRATORY_TRACT | Status: DC

## 2014-08-11 MED ORDER — AZITHROMYCIN 250 MG PO TABS: ORAL_TABLET | ORAL | Status: DC

## 2014-08-11 NOTE — Progress Notes (Signed)
   Subjective:    Patient ID: Stephaney Steven, female    DOB: 1946-10-26, 68 y.o.   MRN: [id number]  HPI Patient in today c/o cough and congestion that started several days ago. Cough is worse at night.  * had pneumonia in June 2015.  Review of Systems  Constitutional: Negative for fever, chills, appetite change and fatigue.  HENT: Positive for congestion and rhinorrhea.   Respiratory: Positive for cough.   Cardiovascular: Negative.   Gastrointestinal: Negative.   Genitourinary: Negative.   Neurological: Negative.   Psychiatric/Behavioral: Negative.   All other systems reviewed and are negative.      Objective:   Physical Exam  Constitutional: She appears well-developed and well-nourished.  HENT:  Right Ear: Hearing, tympanic membrane, external ear and ear canal normal.  Left Ear: Hearing, tympanic membrane, external ear and ear canal normal.  Nose: Mucosal edema and rhinorrhea present.  Mouth/Throat: Uvula is midline, oropharynx is clear and moist and mucous membranes are normal.  Eyes: Pupils are equal, round, and reactive to light.  Neck: Normal range of motion.  Cardiovascular: Normal rate, regular rhythm and normal heart sounds.   Pulmonary/Chest: Effort normal and breath sounds normal.  Lymphadenopathy:    She has no cervical adenopathy.    BP 98/62  Temp(Src) 97.9 F (36.6 C) (Oral)  Ht 5\' 2"  (1.575 m)  Wt 199 lb (90.266 kg)  BMI 36.39 kg/m2       Assessment & Plan:  1. COPD bronchitis - budesonide-formoterol (SYMBICORT) 80-4.5 MCG/ACT inhaler; Inhale 2 puffs into the lungs 2 (two) times daily.  Dispense: 1 Inhaler; Refill: 3  2. COPD exacerbation STOP SMOKING - azithromycin (ZITHROMAX Z-PAK) 250 MG tablet; As directed  Dispense: 6 each; Refill: 0 1. Take meds as prescribed 2. Use a cool mist humidifier especially during the winter months and when heat has been humid. 3. Use saline nose sprays frequently 4. Saline irrigations of the nose can be very helpful  if done frequently.  * 4X daily for 1 week*  * Use of a nettie pot can be helpful with this. Follow directions with this* 5. Drink plenty of fluids 6. Keep thermostat turn down low 7.For any cough or congestion  Use plain Mucinex- regular strength or max strength is fine   * Children- consult with Pharmacist for dosing 8. For fever or aces or pains- take tylenol or ibuprofen appropriate for age and weight.  * for fevers greater than 101 orally you may alternate ibuprofen and tylenol every  3 hours.   Mary-Margaret Hassell Done, FNP

## 2014-08-11 NOTE — Patient Instructions (Signed)
Smoking Cessation Quitting smoking is important to your health and has many advantages. However, it is not always easy to quit since nicotine is a very addictive drug. Oftentimes, people try 3 times or more before being able to quit. This document explains the best ways for you to prepare to quit smoking. Quitting takes hard work and a lot of effort, but you can do it. ADVANTAGES OF QUITTING SMOKING  You will live longer, feel better, and live better.  Your body will feel the impact of quitting smoking almost immediately.  Within 20 minutes, blood pressure decreases. Your pulse returns to its normal level.  After 8 hours, carbon monoxide levels in the blood return to normal. Your oxygen level increases.  After 24 hours, the chance of having a heart attack starts to decrease. Your breath, hair, and body stop smelling like smoke.  After 48 hours, damaged nerve endings begin to recover. Your sense of taste and smell improve.  After 72 hours, the body is virtually free of nicotine. Your bronchial tubes relax and breathing becomes easier.  After 2 to 12 weeks, lungs can hold more air. Exercise becomes easier and circulation improves.  The risk of having a heart attack, stroke, cancer, or lung disease is greatly reduced.  After 1 year, the risk of coronary heart disease is cut in half.  After 5 years, the risk of stroke falls to the same as a nonsmoker.  After 10 years, the risk of lung cancer is cut in half and the risk of other cancers decreases significantly.  After 15 years, the risk of coronary heart disease drops, usually to the level of a nonsmoker.  If you are pregnant, quitting smoking will improve your chances of having a healthy baby.  The people you live with, especially any children, will be healthier.  You will have extra money to spend on things other than cigarettes. QUESTIONS TO THINK ABOUT BEFORE ATTEMPTING TO QUIT You may want to talk about your answers with your  health care provider.  Why do you want to quit?  If you tried to quit in the past, what helped and what did not?  What will be the most difficult situations for you after you quit? How will you plan to handle them?  Who can help you through the tough times? Your family? Friends? A health care provider?  What pleasures do you get from smoking? What ways can you still get pleasure if you quit? Here are some questions to ask your health care provider:  How can you help me to be successful at quitting?  What medicine do you think would be best for me and how should I take it?  What should I do if I need more help?  What is smoking withdrawal like? How can I get information on withdrawal? GET READY  Set a quit date.  Change your environment by getting rid of all cigarettes, ashtrays, matches, and lighters in your home, car, or work. Do not let people smoke in your home.  Review your past attempts to quit. Think about what worked and what did not. GET SUPPORT AND ENCOURAGEMENT You have a better chance of being successful if you have help. You can get support in many ways.  Tell your family, friends, and coworkers that you are going to quit and need their support. Ask them not to smoke around you.  Get individual, group, or telephone counseling and support. Programs are available at local hospitals and health centers. Call   your local health department for information about programs in your area.  Spiritual beliefs and practices may help some smokers quit.  Download a "quit meter" on your computer to keep track of quit statistics, such as how long you have gone without smoking, cigarettes not smoked, and money saved.  Get a self-help book about quitting smoking and staying off tobacco. LEARN NEW SKILLS AND BEHAVIORS  Distract yourself from urges to smoke. Talk to someone, go for a walk, or occupy your time with a task.  Change your normal routine. Take a different route to work.  Drink tea instead of coffee. Eat breakfast in a different place.  Reduce your stress. Take a hot bath, exercise, or read a book.  Plan something enjoyable to do every day. Reward yourself for not smoking.  Explore interactive web-based programs that specialize in helping you quit. GET MEDICINE AND USE IT CORRECTLY Medicines can help you stop smoking and decrease the urge to smoke. Combining medicine with the above behavioral methods and support can greatly increase your chances of successfully quitting smoking.  Nicotine replacement therapy helps deliver nicotine to your body without the negative effects and risks of smoking. Nicotine replacement therapy includes nicotine gum, lozenges, inhalers, nasal sprays, and skin patches. Some may be available over-the-counter and others require a prescription.  Antidepressant medicine helps people abstain from smoking, but how this works is unknown. This medicine is available by prescription.  Nicotinic receptor partial agonist medicine simulates the effect of nicotine in your brain. This medicine is available by prescription. Ask your health care provider for advice about which medicines to use and how to use them based on your health history. Your health care provider will tell you what side effects to look out for if you choose to be on a medicine or therapy. Carefully read the information on the package. Do not use any other product containing nicotine while using a nicotine replacement product.  RELAPSE OR DIFFICULT SITUATIONS Most relapses occur within the first 3 months after quitting. Do not be discouraged if you start smoking again. Remember, most people try several times before finally quitting. You may have symptoms of withdrawal because your body is used to nicotine. You may crave cigarettes, be irritable, feel very hungry, cough often, get headaches, or have difficulty concentrating. The withdrawal symptoms are only temporary. They are strongest  when you first quit, but they will go away within 10-14 days. To reduce the chances of relapse, try to:  Avoid drinking alcohol. Drinking lowers your chances of successfully quitting.  Reduce the amount of caffeine you consume. Once you quit smoking, the amount of caffeine in your body increases and can give you symptoms, such as a rapid heartbeat, sweating, and anxiety.  Avoid smokers because they can make you want to smoke.  Do not let weight gain distract you. Many smokers will gain weight when they quit, usually less than 10 pounds. Eat a healthy diet and stay active. You can always lose the weight gained after you quit.  Find ways to improve your mood other than smoking. FOR MORE INFORMATION  www.smokefree.gov  Document Released: 10/28/2001 Document Revised: 03/20/2014 Document Reviewed: 02/12/2012 [employer] Patient Information 2015 [employer]. This information is not intended to replace advice given to you by your health care provider. Make sure you discuss any questions you have with your health care provider.  

## 2014-08-28 ENCOUNTER — Ambulatory Visit (INDEPENDENT_AMBULATORY_CARE_PROVIDER_SITE_OTHER): Payer: Medicare Other | Admitting: Nurse Practitioner

## 2014-08-28 ENCOUNTER — Encounter: Payer: Self-pay | Admitting: Nurse Practitioner

## 2014-08-28 VITALS — BP 140/80 | HR 76 | Temp 97.8°F | Ht <= 58 in | Wt 199.0 lb

## 2014-08-28 DIAGNOSIS — M8580 Other specified disorders of bone density and structure, unspecified site: Secondary | ICD-10-CM

## 2014-08-28 DIAGNOSIS — I10 Essential (primary) hypertension: Secondary | ICD-10-CM

## 2014-08-28 DIAGNOSIS — F411 Generalized anxiety disorder: Secondary | ICD-10-CM

## 2014-08-28 DIAGNOSIS — K5732 Diverticulitis of large intestine without perforation or abscess without bleeding: Secondary | ICD-10-CM

## 2014-08-28 DIAGNOSIS — K219 Gastro-esophageal reflux disease without esophagitis: Secondary | ICD-10-CM

## 2014-08-28 DIAGNOSIS — G4700 Insomnia, unspecified: Secondary | ICD-10-CM

## 2014-08-28 DIAGNOSIS — E785 Hyperlipidemia, unspecified: Secondary | ICD-10-CM

## 2014-08-28 NOTE — Progress Notes (Signed)
Subjective:    Patient ID: Renee Maldonado, female    DOB: [birth year], 68 y.o.   MRN: [id number]  Patient here today for follow up . Doing well today without complaints-  Hypertension This is a chronic problem. The current episode started more than 1 year ago. The problem is unchanged. The problem is controlled. Pertinent negatives include no blurred vision, chest pain, headaches, neck pain, orthopnea, palpitations, peripheral edema, PND, shortness of breath or sweats. There are no associated agents to hypertension. Risk factors for coronary artery disease include dyslipidemia, obesity and post-menopausal state. Past treatments include angiotensin blockers and diuretics. The current treatment provides moderate improvement. Compliance problems include diet and exercise.   Hyperlipidemia This is a chronic problem. The current episode started more than 1 year ago. The problem is uncontrolled. Recent lipid tests were reviewed and are high. Exacerbating diseases include obesity. She has no history of diabetes, hypothyroidism or liver disease. Factors aggravating her hyperlipidemia include thiazides. Pertinent negatives include no chest pain or shortness of breath. Current antihyperlipidemic treatment includes statins (recently changed from lipitor to crestor). There are no compliance problems.  Risk factors for coronary artery disease include hypertension, family history, obesity and post-menopausal.  GERD Currently trying diet control Ostenpenia Not on any meds- is trying weight bearing exercsie but doesn't do daily.Last dexa scan was 11/20/10. Insomnia Trazadone nightly- rest well Diverticulitis Patient tries to watch diet- no recent flare ups.   Review of Systems  Eyes: Negative for blurred vision.  Respiratory: Negative for shortness of breath.   Cardiovascular: Negative for chest pain, palpitations, orthopnea and PND.  Musculoskeletal: Negative for neck pain.  Neurological: Negative for headaches.   All other systems reviewed and are negative.      Objective:   Physical Exam  Constitutional: She is oriented to person, place, and time. She appears well-developed and well-nourished.  HENT:  Nose: Nose normal.  Mouth/Throat: Oropharynx is clear and moist.  Eyes: EOM are normal.  Neck: Trachea normal, normal range of motion and full passive range of motion without pain. Neck supple. No JVD present. Carotid bruit is not present. No thyromegaly present.  Cardiovascular: Normal rate, regular rhythm, normal heart sounds and intact distal pulses.  Exam reveals no gallop and no friction rub.   No murmur heard. Pulmonary/Chest: Effort normal and breath sounds normal.  Abdominal: Soft. Bowel sounds are normal. She exhibits no distension and no mass. There is no tenderness.  Musculoskeletal: Normal range of motion.  Lymphadenopathy:    She has no cervical adenopathy.  Neurological: She is alert and oriented to person, place, and time. She has normal reflexes.  Skin: Skin is warm and dry.  Psychiatric: She has a normal mood and affect. Her behavior is normal. Judgment and thought content normal.  BP 140/80  Pulse 76  Temp(Src) 97.8 F (36.6 C) (Oral)  Ht 3' (0.914 m)  Wt 199 lb (90.266 kg)  BMI 108.05 kg/m2  PF 5 L/min         Assessment & Plan:   1. Osteopenia Patient is now taking evista- no side effects Weight bearing exercises as encouraged  2. Insomnia Continue trazadone as rx  3. Essential hypertension Low NA+ diet - CMP14+EGFR  4. Hyperlipidemia Low fat diet - NMR, lipoprofile  5. Gastroesophageal reflux disease without esophagitis Avoid spicy and fatty foods  6. GAD (generalized anxiety disorder) STress management  7. Diverticulitis of colon  Encouraged to do hemocult cards Labs pending Health maintenance reviewed Diet and exercise encouraged Continue  all meds Follow up  In 3 months   [place], FNP

## 2014-08-28 NOTE — Patient Instructions (Signed)

## 2014-08-29 LAB — CMP14+EGFR
ALT: 16 IU/L (ref 0–32)
AST: 13 IU/L (ref 0–40)
Albumin/Globulin Ratio: 2.8 — ABNORMAL HIGH (ref 1.1–2.5)
Albumin: 4.5 g/dL (ref 3.6–4.8)
Alkaline Phosphatase: 69 IU/L (ref 39–117)
BUN/Creatinine Ratio: 24 (ref 11–26)
BUN: 26 mg/dL (ref 8–27)
CALCIUM: 9.6 mg/dL (ref 8.7–10.3)
CHLORIDE: 103 mmol/L (ref 97–108)
CO2: 22 mmol/L (ref 18–29)
Creatinine, Ser: 1.1 mg/dL — ABNORMAL HIGH (ref 0.57–1.00)
GFR calc Af Amer: 60 mL/min/{1.73_m2} (ref >59)
GFR calc non Af Amer: 52 mL/min/{1.73_m2} — ABNORMAL LOW (ref >59)
Globulin, Total: 1.6 g/dL (ref 1.5–4.5)
Glucose: 102 mg/dL — ABNORMAL HIGH (ref 65–99)
Potassium: 5 mmol/L (ref 3.5–5.2)
SODIUM: 141 mmol/L (ref 134–144)
Total Bilirubin: 0.4 mg/dL (ref 0.0–1.2)
Total Protein: 6.1 g/dL (ref 6.0–8.5)

## 2014-08-29 LAB — NMR, LIPOPROFILE
Cholesterol: 179 mg/dL (ref 100–199)
HDL CHOLESTEROL BY NMR: 44 mg/dL (ref >39)
HDL Particle Number: 30.3 umol/L — ABNORMAL LOW (ref >=30.5)
LDL Particle Number: 1403 nmol/L — ABNORMAL HIGH (ref <1000)
LDL Size: 20.2 nm (ref >20.5)
LDLC SERPL CALC-MCNC: 111 mg/dL — AB (ref 0–99)
LP-IR Score: 62 — ABNORMAL HIGH (ref <=45)
Small LDL Particle Number: 844 nmol/L — ABNORMAL HIGH (ref <=527)
Triglycerides by NMR: 121 mg/dL (ref 0–149)

## 2014-08-30 ENCOUNTER — Telehealth: Payer: Self-pay | Admitting: Family Medicine

## 2014-08-30 NOTE — Telephone Encounter (Signed)
Message copied by Waverly Ferrari on Wed Aug 30, 2014  3:28 PM ------      Message from: Chevis Pretty      Created: Tue Aug 29, 2014  9:53 AM       Kidney and liver function stable      ldl particle numbers and LDL are going up- need to go back on statin and watch diet ------

## 2014-09-13 ENCOUNTER — Encounter: Payer: Self-pay | Admitting: Family Medicine

## 2014-10-03 ENCOUNTER — Telehealth: Payer: Self-pay | Admitting: Nurse Practitioner

## 2014-10-03 NOTE — Telephone Encounter (Signed)
ok 

## 2014-10-11 NOTE — Telephone Encounter (Signed)
Multiple attempts have been made to contact pt, I tried spouses mobile number as well as the pt's and the home number. All numbers have been disconnected, will close encounter.

## 2014-11-30 ENCOUNTER — Encounter: Payer: Self-pay | Admitting: Family

## 2014-11-30 ENCOUNTER — Ambulatory Visit (INDEPENDENT_AMBULATORY_CARE_PROVIDER_SITE_OTHER): Payer: Medicare Other | Admitting: Family

## 2014-11-30 VITALS — BP 126/79 | HR 70 | Temp 97.5°F | Ht 62.0 in | Wt 193.6 lb

## 2014-11-30 DIAGNOSIS — J069 Acute upper respiratory infection, unspecified: Secondary | ICD-10-CM

## 2014-11-30 DIAGNOSIS — R05 Cough: Secondary | ICD-10-CM

## 2014-11-30 DIAGNOSIS — R059 Cough, unspecified: Secondary | ICD-10-CM

## 2014-11-30 MED ORDER — BENZONATATE 200 MG PO CAPS: 200.0000 mg | ORAL_CAPSULE | Freq: Three times a day (TID) | ORAL | Status: DC | PRN

## 2014-11-30 MED ORDER — AMOXICILLIN-POT CLAVULANATE 875-125 MG PO TABS: 1.0000 | ORAL_TABLET | Freq: Two times a day (BID) | ORAL | Status: DC

## 2014-11-30 NOTE — Progress Notes (Signed)
Subjective:    Patient ID: Renee Maldonado, female    DOB: 1946/01/02, 69 y.o.   MRN: [id number]  Cough This is a new problem. The current episode started in the past 7 days. The problem has been unchanged. The problem occurs every few minutes. The cough is productive of purulent sputum. Associated symptoms include headaches, myalgias, nasal congestion, postnasal drip, rhinorrhea, a sore throat, shortness of breath and wheezing. Pertinent negatives include no chills, ear congestion, ear pain, fever or hemoptysis. The symptoms are aggravated by lying down. She has tried nothing for the symptoms. The treatment provided no relief. There is no history of asthma or COPD.      Review of Systems  Constitutional: Negative.  Negative for fever and chills.  HENT: Positive for postnasal drip, rhinorrhea and sore throat. Negative for ear pain.   Eyes: Negative.   Respiratory: Positive for cough, shortness of breath and wheezing. Negative for hemoptysis.   Cardiovascular: Negative.  Negative for palpitations.  Gastrointestinal: Negative.   Endocrine: Negative.   Genitourinary: Negative.   Musculoskeletal: Positive for myalgias.  Neurological: Positive for headaches.  Hematological: Negative.   Psychiatric/Behavioral: Negative.   All other systems reviewed and are negative.      Objective:   Physical Exam  Constitutional: She is oriented to person, place, and time. She appears well-developed and well-nourished. No distress.  HENT:  Head: Normocephalic and atraumatic.  Right Ear: External ear normal.  Left Ear: External ear normal.  Mouth/Throat: Oropharynx is clear and moist.  Nasal passage erythemas with mild swelling and small amt of blood  Oropharynx erythemas  Eyes: Pupils are equal, round, and reactive to light.  Neck: Normal range of motion. Neck supple. No thyromegaly present.  Cardiovascular: Normal rate, regular rhythm, normal heart sounds and intact distal pulses.   No murmur  heard. Pulmonary/Chest: Effort normal and breath sounds normal. No respiratory distress. She has no wheezes.  Abdominal: Soft. Bowel sounds are normal. She exhibits no distension. There is no tenderness.  Musculoskeletal: Normal range of motion. She exhibits no edema or tenderness.  Neurological: She is alert and oriented to person, place, and time. She has normal reflexes. No cranial nerve deficit.  Skin: Skin is warm and dry.  Psychiatric: She has a normal mood and affect. Her behavior is normal. Judgment and thought content normal.  Vitals reviewed.     BP 126/79 mmHg  Pulse 70  Temp(Src) 97.5 F (36.4 C) (Oral)  Ht 5\' 2"  (1.575 m)  Wt 193 lb 9.6 oz (87.816 kg)  BMI 35.40 kg/m2     Assessment & Plan:  1. Cough - benzonatate (TESSALON) 200 MG capsule; Take 1 capsule (200 mg total) by mouth 3 (three) times daily as needed.  Dispense: 30 capsule; Refill: 1 - amoxicillin-clavulanate (AUGMENTIN) 875-125 MG per tablet; Take 1 tablet by mouth 2 (two) times daily.  Dispense: 14 tablet; Refill: 0  2. Acute upper respiratory infection -- Take meds as prescribed - Use a cool mist humidifier  -Use saline nose sprays frequently -Saline irrigations of the nose can be very helpful if done frequently.  * 4X daily for 1 week*  * Use of a nettie pot can be helpful with this. Follow directions with this* -Force fluids -For any cough or congestion  Use plain Mucinex- regular strength or max strength is fine   * Children- consult with Pharmacist for dosing -For fever or aces or pains- take tylenol or ibuprofen appropriate for age and weight.  * for  fevers greater than 101 orally you may alternate ibuprofen and tylenol every  3 hours. -Throat lozenges if help - amoxicillin-clavulanate (AUGMENTIN) 875-125 MG per tablet; Take 1 tablet by mouth 2 (two) times daily.  Dispense: 14 tablet; Refill: 0  *Pt states she had a slight rash years ago after taking penicillins and does not believe it was a  true "allergy". Pt states she would like to try the amoxicillin.   [name], FNP

## 2014-11-30 NOTE — Patient Instructions (Addendum)
Upper Respiratory Infection, Adult An upper respiratory infection (URI) is also sometimes known as the common cold. The upper respiratory tract includes the nose, sinuses, throat, trachea, and bronchi. Bronchi are the airways leading to the lungs. Most people improve within 1 week, but symptoms can last up to 2 weeks. A residual cough may last even longer.  CAUSES Many different viruses can infect the tissues lining the upper respiratory tract. The tissues become irritated and inflamed and often become very moist. Mucus production is also common. A cold is contagious. You can easily spread the virus to others by oral contact. This includes kissing, sharing a glass, coughing, or sneezing. Touching your mouth or nose and then touching a surface, which is then touched by another person, can also spread the virus. SYMPTOMS  Symptoms typically develop 1 to 3 days after you come in contact with a cold virus. Symptoms vary from person to person. They may include:  Runny nose.  Sneezing.  Nasal congestion.  Sinus irritation.  Sore throat.  Loss of voice (laryngitis).  Cough.  Fatigue.  Muscle aches.  Loss of appetite.  Headache.  Low-grade fever. DIAGNOSIS  You might diagnose your own cold based on familiar symptoms, since most people get a cold 2 to 3 times a year. Your caregiver can confirm this based on your exam. Most importantly, your caregiver can check that your symptoms are not due to another disease such as strep throat, sinusitis, pneumonia, asthma, or epiglottitis. Blood tests, throat tests, and X-rays are not necessary to diagnose a common cold, but they may sometimes be helpful in excluding other more serious diseases. Your caregiver will decide if any further tests are required. RISKS AND COMPLICATIONS  You may be at risk for a more severe case of the common cold if you smoke cigarettes, have chronic heart disease (such as heart failure) or lung disease (such as asthma), or if  you have a weakened immune system. The very young and very old are also at risk for more serious infections. Bacterial sinusitis, middle ear infections, and bacterial pneumonia can complicate the common cold. The common cold can worsen asthma and chronic obstructive pulmonary disease (COPD). Sometimes, these complications can require emergency medical care and may be life-threatening. PREVENTION  The best way to protect against getting a cold is to practice good hygiene. Avoid oral or hand contact with people with cold symptoms. Wash your hands often if contact occurs. There is no clear evidence that vitamin C, vitamin E, echinacea, or exercise reduces the chance of developing a cold. However, it is always recommended to get plenty of rest and practice good nutrition. TREATMENT  Treatment is directed at relieving symptoms. There is no cure. Antibiotics are not effective, because the infection is caused by a virus, not by bacteria. Treatment may include:  Increased fluid intake. Sports drinks offer valuable electrolytes, sugars, and fluids.  Breathing heated mist or steam (vaporizer or shower).  Eating chicken soup or other clear broths, and maintaining good nutrition.  Getting plenty of rest.  Using gargles or lozenges for comfort.  Controlling fevers with ibuprofen or acetaminophen as directed by your caregiver.  Increasing usage of your inhaler if you have asthma. Zinc gel and zinc lozenges, taken in the first 24 hours of the common cold, can shorten the duration and lessen the severity of symptoms. Pain medicines may help with fever, muscle aches, and throat pain. A variety of non-prescription medicines are available to treat congestion and runny nose. Your caregiver   can make recommendations and may suggest nasal or lung inhalers for other symptoms.  HOME CARE INSTRUCTIONS   Only take over-the-counter or prescription medicines for pain, discomfort, or fever as directed by your  caregiver.  Use a warm mist humidifier or inhale steam from a shower to increase air moisture. This may keep secretions moist and make it easier to breathe.  Drink enough water and fluids to keep your urine clear or pale yellow.  Rest as needed.  Return to work when your temperature has returned to normal or as your caregiver advises. You may need to stay home longer to avoid infecting others. You can also use a face mask and careful hand washing to prevent spread of the virus. SEEK MEDICAL CARE IF:   After the first few days, you feel you are getting worse rather than better.  You need your caregiver's advice about medicines to control symptoms.  You develop chills, worsening shortness of breath, or brown or red sputum. These may be signs of pneumonia.  You develop yellow or brown nasal discharge or pain in the face, especially when you bend forward. These may be signs of sinusitis.  You develop a fever, swollen neck glands, pain with swallowing, or white areas in the back of your throat. These may be signs of strep throat. SEEK IMMEDIATE MEDICAL CARE IF:   You have a fever.  You develop severe or persistent headache, ear pain, sinus pain, or chest pain.  You develop wheezing, a prolonged cough, cough up blood, or have a change in your usual mucus (if you have chronic lung disease).  You develop sore muscles or a stiff neck. Document Released: 04/29/2001 Document Revised: 01/26/2012 Document Reviewed: 02/08/2014 Exit[employer] Patient Information 2015 [employer]. This information is not intended to replace advice given to you by your health care provider. Make sure you discuss any questions you have with your health care provider.  - Take meds as prescribed - Use a cool mist humidifier  -Use saline nose sprays frequently -Saline irrigations of the nose can be very helpful if done frequently.  * 4X daily for 1 week*  * Use of a nettie pot can be helpful with this. Follow  directions with this* -Force fluids -For any cough or congestion  Use plain Mucinex- regular strength or max strength is fine   * Children- consult with Pharmacist for dosing -For fever or aces or pains- take tylenol or ibuprofen appropriate for age and weight.  * for fevers greater than 101 orally you may alternate ibuprofen and tylenol every  3 hours. -Throat lozenges if help   Christy Hawks, FNP   

## 2014-12-04 ENCOUNTER — Ambulatory Visit (INDEPENDENT_AMBULATORY_CARE_PROVIDER_SITE_OTHER): Payer: Medicare Other | Admitting: Nurse Practitioner

## 2014-12-04 ENCOUNTER — Encounter: Payer: Self-pay | Admitting: Nurse Practitioner

## 2014-12-04 VITALS — BP 132/84 | HR 66 | Temp 97.1°F | Ht 62.0 in | Wt 194.0 lb

## 2014-12-04 DIAGNOSIS — G4700 Insomnia, unspecified: Secondary | ICD-10-CM

## 2014-12-04 DIAGNOSIS — F411 Generalized anxiety disorder: Secondary | ICD-10-CM

## 2014-12-04 DIAGNOSIS — IMO0001 Reserved for inherently not codable concepts without codable children: Secondary | ICD-10-CM

## 2014-12-04 DIAGNOSIS — K219 Gastro-esophageal reflux disease without esophagitis: Secondary | ICD-10-CM

## 2014-12-04 DIAGNOSIS — E785 Hyperlipidemia, unspecified: Secondary | ICD-10-CM

## 2014-12-04 DIAGNOSIS — Z6835 Body mass index (BMI) 35.0-35.9, adult: Secondary | ICD-10-CM

## 2014-12-04 DIAGNOSIS — M8580 Other specified disorders of bone density and structure, unspecified site: Secondary | ICD-10-CM

## 2014-12-04 DIAGNOSIS — J449 Chronic obstructive pulmonary disease, unspecified: Secondary | ICD-10-CM

## 2014-12-04 DIAGNOSIS — I10 Essential (primary) hypertension: Secondary | ICD-10-CM

## 2014-12-04 MED ORDER — RALOXIFENE HCL 60 MG PO TABS: 60.0000 mg | ORAL_TABLET | Freq: Every day | ORAL | Status: DC

## 2014-12-04 MED ORDER — LOSARTAN POTASSIUM-HCTZ 100-25 MG PO TABS: 1.0000 | ORAL_TABLET | Freq: Every day | ORAL | Status: DC

## 2014-12-04 MED ORDER — ROSUVASTATIN CALCIUM 20 MG PO TABS: 20.0000 mg | ORAL_TABLET | Freq: Every day | ORAL | Status: DC

## 2014-12-04 MED ORDER — TRAZODONE HCL 50 MG PO TABS: 50.0000 mg | ORAL_TABLET | Freq: Every day | ORAL | Status: DC

## 2014-12-04 MED ORDER — RANITIDINE HCL 150 MG PO TABS: 150.0000 mg | ORAL_TABLET | Freq: Two times a day (BID) | ORAL | Status: DC

## 2014-12-04 NOTE — Progress Notes (Signed)
Subjective:    Patient ID: Renee Maldonado, female    DOB: [birth year], 69 y.o.   MRN: [id number]  Patient here today for follow up . Doing well today without complaints-  Hypertension This is a chronic problem. The current episode started more than 1 year ago. The problem is unchanged. The problem is controlled. Pertinent negatives include no chest pain, headaches, neck pain, palpitations or shortness of breath. Risk factors for coronary artery disease include dyslipidemia, obesity, post-menopausal state and family history. Past treatments include angiotensin blockers and diuretics.  Hyperlipidemia This is a chronic problem. The current episode started more than 1 year ago. The problem is controlled. Recent lipid tests were reviewed and are normal. Exacerbating diseases include obesity. She has no history of diabetes or hypothyroidism. Factors aggravating her hyperlipidemia include thiazides. Pertinent negatives include no chest pain or shortness of breath. Current antihyperlipidemic treatment includes statins. The current treatment provides no improvement of lipids. Compliance problems include adherence to diet and adherence to exercise.  Risk factors for coronary artery disease include dyslipidemia, hypertension, post-menopausal and obesity.  GERD Currently trying diet control Ostenpenia Not on any meds- is trying weight bearing exercsie but doesn't do daily.Last dexa scan was 11/20/10. Insomnia Trazadone nightly- rest well Diverticulitis Patient tries to watch diet- no recent flare ups.   Review of Systems  Respiratory: Negative for shortness of breath.   Cardiovascular: Negative for chest pain and palpitations.  Musculoskeletal: Negative for neck pain.  Neurological: Negative for headaches.  All other systems reviewed and are negative.      Objective:   Physical Exam  Constitutional: She is oriented to person, place, and time. She appears well-developed and well-nourished.  HENT:   Nose: Nose normal.  Mouth/Throat: Oropharynx is clear and moist.  Eyes: EOM are normal.  Neck: Trachea normal, normal range of motion and full passive range of motion without pain. Neck supple. No JVD present. Carotid bruit is not present. No thyromegaly present.  Cardiovascular: Normal rate, regular rhythm, normal heart sounds and intact distal pulses.  Exam reveals no gallop and no friction rub.   No murmur heard. Pulmonary/Chest: Effort normal and breath sounds normal.  Abdominal: Soft. Bowel sounds are normal. She exhibits no distension and no mass. There is no tenderness.  Musculoskeletal: Normal range of motion.  Lymphadenopathy:    She has no cervical adenopathy.  Neurological: She is alert and oriented to person, place, and time. She has normal reflexes.  Skin: Skin is warm and dry.  Psychiatric: She has a normal mood and affect. Her behavior is normal. Judgment and thought content normal.   BP 132/84 mmHg  Pulse 66  Temp(Src) 97.1 F (36.2 C) (Oral)  Ht _0  (1.575 m)  Wt 194 lb (87.998 kg)  BMI 35.47 kg/m2         Assessment & Plan:   1. Essential hypertension Do not add salt to diet - losartan-hydrochlorothiazide (HYZAAR) 100-25 MG per tablet; Take 1 tablet by mouth daily.  Dispense: 90 tablet; Refill: 1 - CMP14+EGFR  2. COPD bronchitis  3. Gastroesophageal reflux disease without esophagitis Avoid spicy foods - ranitidine (ZANTAC) 150 MG tablet; Take 1 tablet (150 mg total) by mouth 2 (two) times daily.  Dispense: 180 tablet; Refill: 1  4. GAD (generalized anxiety disorder) Stress mangement  5. Hyperlipidemia Low fat diet - rosuvastatin (CRESTOR) 20 MG tablet; Take 1 tablet (20 mg total) by mouth daily.  Dispense: 90 tablet; Refill: 1 - NMR, lipoprofile  6. Insomnia Bedtime ritual -  traZODone (DESYREL) 50 MG tablet; Take 1 tablet (50 mg total) by mouth at bedtime.  Dispense: 90 tablet; Refill: 1  7. BMI 35.0-35.9,adult Discussed diet and exercise  for person with BMI >25 Will recheck weight in 3-6 months   8. Osteopenia Weight bearing exercises - raloxifene (EVISTA) 60 MG tablet; Take 1 tablet (60 mg total) by mouth daily.  Dispense: 90 tablet; Refill: 1   hemoccult cards given to patient with directions Labs pending Health maintenance reviewed Diet and exercise encouraged Continue all meds Follow up  In 3 months   [place], FNP

## 2014-12-04 NOTE — Patient Instructions (Signed)

## 2014-12-05 ENCOUNTER — Other Ambulatory Visit: Payer: Medicare Other

## 2014-12-05 DIAGNOSIS — Z1212 Encounter for screening for malignant neoplasm of rectum: Secondary | ICD-10-CM

## 2014-12-05 LAB — CMP14+EGFR
ALT: 20 IU/L (ref 0–32)
AST: 18 IU/L (ref 0–40)
Albumin/Globulin Ratio: 2.9 — ABNORMAL HIGH (ref 1.1–2.5)
Albumin: 4.3 g/dL (ref 3.6–4.8)
Alkaline Phosphatase: 55 IU/L (ref 39–117)
BUN/Creatinine Ratio: 18 (ref 11–26)
BUN: 18 mg/dL (ref 8–27)
CO2: 26 mmol/L (ref 18–29)
Calcium: 8.9 mg/dL (ref 8.7–10.3)
Chloride: 102 mmol/L (ref 97–108)
Creatinine, Ser: 0.99 mg/dL (ref 0.57–1.00)
GFR, EST AFRICAN AMERICAN: 67 mL/min/{1.73_m2} (ref >59)
GFR, EST NON AFRICAN AMERICAN: 58 mL/min/{1.73_m2} — AB (ref >59)
GLUCOSE: 104 mg/dL — AB (ref 65–99)
Globulin, Total: 1.5 g/dL (ref 1.5–4.5)
POTASSIUM: 4.2 mmol/L (ref 3.5–5.2)
SODIUM: 140 mmol/L (ref 134–144)
Total Bilirubin: 0.3 mg/dL (ref 0.0–1.2)
Total Protein: 5.8 g/dL — ABNORMAL LOW (ref 6.0–8.5)

## 2014-12-05 LAB — NMR, LIPOPROFILE
Cholesterol: 147 mg/dL (ref 100–199)
HDL Cholesterol by NMR: 39 mg/dL — ABNORMAL LOW (ref >39)
HDL Particle Number: 27.2 umol/L — ABNORMAL LOW (ref >=30.5)
LDL Particle Number: 968 nmol/L (ref <1000)
LDL Size: 20.1 nm (ref >20.5)
LDL-C: 77 mg/dL (ref 0–99)
LP-IR Score: 49 — ABNORMAL HIGH (ref <=45)
Small LDL Particle Number: 665 nmol/L — ABNORMAL HIGH (ref <=527)
Triglycerides by NMR: 154 mg/dL — ABNORMAL HIGH (ref 0–149)

## 2014-12-05 NOTE — Progress Notes (Signed)
LAB ONLY SPECIMEN DROP OFF

## 2014-12-07 LAB — FECAL OCCULT BLOOD, IMMUNOCHEMICAL: FECAL OCCULT BLD: POSITIVE — AB

## 2014-12-12 ENCOUNTER — Encounter: Payer: Self-pay | Admitting: *Deleted

## 2015-02-05 ENCOUNTER — Other Ambulatory Visit: Payer: Medicare Other | Admitting: Nurse Practitioner

## 2015-02-26 ENCOUNTER — Other Ambulatory Visit: Payer: Medicare Other

## 2015-02-26 DIAGNOSIS — Z1212 Encounter for screening for malignant neoplasm of rectum: Secondary | ICD-10-CM

## 2015-02-26 NOTE — Progress Notes (Signed)
Lab only 

## 2015-02-28 ENCOUNTER — Encounter: Payer: Self-pay | Admitting: Family Medicine

## 2015-02-28 ENCOUNTER — Ambulatory Visit (INDEPENDENT_AMBULATORY_CARE_PROVIDER_SITE_OTHER): Payer: Medicare Other | Admitting: Family Medicine

## 2015-02-28 VITALS — BP 128/66 | HR 72 | Temp 97.8°F | Ht 62.0 in | Wt 195.0 lb

## 2015-02-28 DIAGNOSIS — H6121 Impacted cerumen, right ear: Secondary | ICD-10-CM | POA: Diagnosis not present

## 2015-02-28 LAB — FECAL OCCULT BLOOD, IMMUNOCHEMICAL: FECAL OCCULT BLD: NEGATIVE

## 2015-02-28 NOTE — Patient Instructions (Signed)
Always be careful with using hairdryer's and drying out the ear canals too much as it makes the wax stay in there longer and adhere to the skin of the ear canal

## 2015-02-28 NOTE — Progress Notes (Signed)
Subjective:    Patient ID: Renee Maldonado, female    DOB: [birth year], 69 y.o.   MRN: [id number]  HPI Patient here today for right ear discomfort and itching. This started about 1 month ago. She has also felt slightly dizzy. The patient complains with the ear feeling stopped up. She may have a little bit of a sore throat but not anything major. She does not use hairdryer's she usually dries her hair air drying.      Patient Active Problem List   Diagnosis Date Noted  . COPD bronchitis 08/11/2014  . Fall 12/19/2013  . Heme positive stool 09/14/2013  . Hyperlipidemia 06/29/2013  . Hypertension 06/29/2013  . GAD (generalized anxiety disorder) 06/29/2013  . Diverticulitis of colon 06/29/2013  . GERD (gastroesophageal reflux disease) 06/29/2013  . Insomnia 06/29/2013  . Osteopenia 06/29/2013   Outpatient Encounter Prescriptions as of 02/28/2015  Medication Sig  . Biotin 1 MG CAPS Take by mouth daily.  . Calcium-Vitamin D-Vitamin K (CALCIUM + D + K PO) Take by mouth 2 (two) times daily before a meal.  . cholecalciferol (VITAMIN D) 1000 UNITS tablet Take 1,000 Units by mouth daily.  . fish oil-omega-3 fatty acids 1000 MG capsule Take 1 g by mouth 2 (two) times daily before a meal.  . Garlic 1093 MG CAPS Take by mouth daily.  Marland Kitchen losartan-hydrochlorothiazide (HYZAAR) 100-25 MG per tablet Take 1 tablet by mouth daily.  Marland Kitchen Lysine 500 MG TABS Take by mouth 2 (two) times daily before a meal.  . raloxifene (EVISTA) 60 MG tablet Take 1 tablet (60 mg total) by mouth daily.  . ranitidine (ZANTAC) 150 MG tablet Take 1 tablet (150 mg total) by mouth 2 (two) times daily.  . rosuvastatin (CRESTOR) 20 MG tablet Take 1 tablet (20 mg total) by mouth daily.  . traZODone (DESYREL) 50 MG tablet Take 1 tablet (50 mg total) by mouth at bedtime.  . TURMERIC PO Take by mouth.  Marland Kitchen albuterol (PROVENTIL HFA;VENTOLIN HFA) 108 (90 BASE) MCG/ACT inhaler Inhale 2 puffs into the lungs every 6 (six) hours as needed for  wheezing or shortness of breath. (Patient not taking: Reported on 02/28/2015)  . benzonatate (TESSALON) 200 MG capsule Take 1 capsule (200 mg total) by mouth 3 (three) times daily as needed. (Patient not taking: Reported on 02/28/2015)  . budesonide-formoterol (SYMBICORT) 80-4.5 MCG/ACT inhaler Inhale 2 puffs into the lungs 2 (two) times daily. (Patient not taking: Reported on 02/28/2015)  . RESTASIS 0.05 % ophthalmic emulsion   . [DISCONTINUED] amoxicillin-clavulanate (AUGMENTIN) 875-125 MG per tablet Take 1 tablet by mouth 2 (two) times daily.      Review of Systems  Constitutional: Negative.   HENT: Positive for ear pain (right ear discomfort and itching).   Eyes: Negative.   Respiratory: Negative.   Cardiovascular: Negative.   Gastrointestinal: Negative.   Endocrine: Negative.   Genitourinary: Negative.   Musculoskeletal: Negative.   Skin: Negative.   Allergic/Immunologic: Negative.   Neurological: Positive for dizziness.  Hematological: Negative.   Psychiatric/Behavioral: Negative.        Objective:   Physical Exam  Constitutional: She is oriented to person, place, and time. She appears well-developed and well-nourished. No distress.  HENT:  Head: Normocephalic and atraumatic.  Mouth/Throat: Oropharynx is clear and moist. No oropharyngeal exudate.  The ear wax is impacted in the right ear canal and I am unable to visualize the TM  Eyes: Conjunctivae and EOM are normal. Pupils are equal, round, and reactive to light.  Right eye exhibits no discharge. Left eye exhibits no discharge. No scleral icterus.  Neck: Normal range of motion. Neck supple. No thyromegaly present.  Musculoskeletal: Normal range of motion.  Lymphadenopathy:    She has no cervical adenopathy.  Neurological: She is alert and oriented to person, place, and time.  Skin: Skin is warm and dry. No rash noted.  Psychiatric: She has a normal mood and affect. Her behavior is normal. Judgment and thought content  normal.  Nursing note and vitals reviewed.  BP 128/66 mmHg  Pulse 72  Temp(Src) 97.8 F (36.6 C) (Oral)  Ht 5\' 2"  (1.575 m)  Wt 195 lb (88.451 kg)  BMI 35.66 kg/m2  Ear irrigation was performed without problems--the cerumen was successfully removed from both ear canals and the TMs appear normal.      Assessment & Plan:  1. Right ear impacted cerumen -Debrox eardrops can be used periodically to help soften and remove ear cerumen -These can be purchased over-the-counter  Patient Instructions  Always be careful with using hairdryer's and drying out the ear canals too much as it makes the wax stay in there longer and adhere to the skin of the ear canal   Arrie Senate MD

## 2015-03-08 ENCOUNTER — Encounter: Payer: Self-pay | Admitting: Nurse Practitioner

## 2015-03-08 ENCOUNTER — Ambulatory Visit (INDEPENDENT_AMBULATORY_CARE_PROVIDER_SITE_OTHER): Payer: Medicare Other | Admitting: Nurse Practitioner

## 2015-03-08 VITALS — BP 142/78 | HR 88 | Temp 89.4°F | Ht 62.5 in | Wt 194.4 lb

## 2015-03-08 DIAGNOSIS — F411 Generalized anxiety disorder: Secondary | ICD-10-CM | POA: Diagnosis not present

## 2015-03-08 DIAGNOSIS — K219 Gastro-esophageal reflux disease without esophagitis: Secondary | ICD-10-CM | POA: Diagnosis not present

## 2015-03-08 DIAGNOSIS — J449 Chronic obstructive pulmonary disease, unspecified: Secondary | ICD-10-CM | POA: Diagnosis not present

## 2015-03-08 DIAGNOSIS — I10 Essential (primary) hypertension: Secondary | ICD-10-CM

## 2015-03-08 DIAGNOSIS — E785 Hyperlipidemia, unspecified: Secondary | ICD-10-CM | POA: Diagnosis not present

## 2015-03-08 DIAGNOSIS — G4700 Insomnia, unspecified: Secondary | ICD-10-CM | POA: Diagnosis not present

## 2015-03-08 DIAGNOSIS — Z6834 Body mass index (BMI) 34.0-34.9, adult: Secondary | ICD-10-CM | POA: Diagnosis not present

## 2015-03-08 DIAGNOSIS — E559 Vitamin D deficiency, unspecified: Secondary | ICD-10-CM | POA: Diagnosis not present

## 2015-03-08 DIAGNOSIS — IMO0001 Reserved for inherently not codable concepts without codable children: Secondary | ICD-10-CM

## 2015-03-08 NOTE — Progress Notes (Signed)
Subjective:    Patient ID: Renee Maldonado, female    DOB: 1946/04/01, 69 y.o.   MRN: [id number]  Patient here today for follow up . Doing well today without complaints-  Hypertension This is a chronic problem. The current episode started more than 1 year ago. The problem is unchanged. The problem is controlled. Pertinent negatives include no chest pain, headaches, neck pain, palpitations or shortness of breath. Risk factors for coronary artery disease include dyslipidemia, obesity, post-menopausal state and family history. Past treatments include angiotensin blockers and diuretics.  Hyperlipidemia This is a chronic problem. The current episode started more than 1 year ago. The problem is controlled. Recent lipid tests were reviewed and are normal. Exacerbating diseases include obesity. She has no history of diabetes or hypothyroidism. Factors aggravating her hyperlipidemia include thiazides. Pertinent negatives include no chest pain or shortness of breath. Current antihyperlipidemic treatment includes statins. The current treatment provides no improvement of lipids. Compliance problems include adherence to diet and adherence to exercise.  Risk factors for coronary artery disease include dyslipidemia, hypertension, post-menopausal and obesity.  GERD Currently trying diet control Ostenpenia Not on any meds- is trying weight bearing exercsie but doesn't do daily.Last dexa scan was 11/20/10. Insomnia Trazadone nightly- rest well Diverticulitis Patient tries to watch diet- no recent flare ups. COPD Patient has stopped using symbicort and has not needed her albuterol in over 3 months.    Review of Systems  Constitutional: Negative.   HENT: Negative.   Respiratory: Negative for shortness of breath.   Cardiovascular: Negative for chest pain and palpitations.  Genitourinary: Negative.   Musculoskeletal: Negative for neck pain.  Neurological: Negative for headaches.  Psychiatric/Behavioral: Negative.    All other systems reviewed and are negative.      Objective:   Physical Exam  Constitutional: She is oriented to person, place, and time. She appears well-developed and well-nourished.  HENT:  Nose: Nose normal.  Mouth/Throat: Oropharynx is clear and moist.  Eyes: EOM are normal.  Neck: Trachea normal, normal range of motion and full passive range of motion without pain. Neck supple. No JVD present. Carotid bruit is not present. No thyromegaly present.  Cardiovascular: Normal rate, regular rhythm, normal heart sounds and intact distal pulses.  Exam reveals no gallop and no friction rub.   No murmur heard. Pulmonary/Chest: Effort normal and breath sounds normal.  Abdominal: Soft. Bowel sounds are normal. She exhibits no distension and no mass. There is no tenderness.  Musculoskeletal: Normal range of motion.  Lymphadenopathy:    She has no cervical adenopathy.  Neurological: She is alert and oriented to person, place, and time. She has normal reflexes.  Skin: Skin is warm and dry.  Psychiatric: She has a normal mood and affect. Her behavior is normal. Judgment and thought content normal.   BP 142/78 mmHg  Pulse 88  Temp(Src) 89.4 F (31.9 C) (Oral)  Ht 5' 2.5" (1.588 m)  Wt 194 lb 6.4 oz (88.179 kg)  BMI 34.97 kg/m2         Assessment & Plan:   1. Essential hypertension Do not add salt to diet - CMP14+EGFR  2. COPD bronchitis If needs albuterol every week then need to start back on symbicort  3. Gastroesophageal reflux disease without esophagitis Avoid spicy foods Do not eat 2 hours prior to bedtime  4. Insomnia Bedtime ritual  5. Hyperlipidemia Low fta diet - NMR, lipoprofile  6. GAD (generalized anxiety disorder) Stress management  7. BMI 34.0-34.9,adult Discussed diet and exercise for  person with BMI >25 Will recheck weight in 3-6 months   8. Vitamin D deficiency - Vit D  25 hydroxy (rtn osteoporosis monitoring)   Pap scheduled for next  month Labs pending Health maintenance reviewed Diet and exercise encouraged Continue all meds Follow up  In 3 months   Omer, FNP

## 2015-03-08 NOTE — Patient Instructions (Signed)
Bone Health Our bones do many things. They provide structure, protect organs, anchor muscles, and store calcium. Adequate calcium in your diet and weight-bearing physical activity help build strong bones, improve bone amounts, and may reduce the risk of weakening of bones (osteoporosis) later in life. PEAK BONE MASS By age 70, the average woman has acquired most of her skeletal bone mass. A large decline occurs in older adults which increases the risk of osteoporosis. In women this occurs around the time of menopause. It is important for young girls to reach their peak bone mass in order to maintain bone health throughout life. A person with high bone mass as a young adult will be more likely to have a higher bone mass later in life. Not enough calcium consumption and physical activity early on could result in a failure to achieve optimum bone mass in adulthood. OSTEOPOROSIS Osteoporosis is a disease of the bones. It is defined as low bone mass with deterioration of bone structure. Osteoporosis leads to an increase risk of fractures with falls. These fractures commonly happen in the wrist, hip, and spine. While men and women of all ages and background can develop osteoporosis, some of the risk factors for osteoporosis are:  Female.  White.  Postmenopausal.  Older adults.  Small in body size.  Eating a diet low in calcium.  Physically inactive.  Smoking.  Use of some medications.  Family history. CALCIUM Calcium is a mineral needed by the body for healthy bones, teeth, and proper function of the heart, muscles, and nerves. The body cannot produce calcium so it must be absorbed through food. Good sources of calcium include:  Dairy products (low fat or nonfat milk, cheese, and yogurt).  Dark green leafy vegetables (bok choy and broccoli).  Calcium fortified foods (orange juice, cereal, bread, soy beverages, and tofu products).  Nuts (almonds). Recommended amounts of calcium vary  for individuals. RECOMMENDED CALCIUM INTAKES Age and Amount in mg per day  Children 1 to 3 years / 700 mg  Children 4 to 8 years / 1,000 mg  Children 9 to 13 years / 1,300 mg  Teens 14 to 18 years / 1,300 mg  Adults 19 to 50 years / 1,000 mg  Adult women 51 to 70 years / 1,200 mg  Adults 71 years and older / 1,200 mg  Pregnant and breastfeeding teens / 1,300 mg  Pregnant and breastfeeding adults / 1,000 mg Vitamin D also plays an important role in healthy bone development. Vitamin D helps in the absorption of calcium. WEIGHT-BEARING PHYSICAL ACTIVITY Regular physical activity has many positive health benefits. Benefits include strong bones. Weight-bearing physical activity early in life is important in reaching peak bone mass. Weight-bearing physical activities cause muscles and bones to work against gravity. Some examples of weight bearing physical activities include:  Walking, jogging, or running.  Field Hockey.  Jumping rope.  Dancing.  Soccer.  Tennis or Racquetball.  Stair climbing.  Basketball.  Hiking.  Weight lifting.  Aerobic fitness classes. Including weight-bearing physical activity into an exercise plan is a great way to keep bones healthy. Adults: Engage in at least 30 minutes of moderate physical activity on most, preferably all, days of the week. Children: Engage in at least 60 minutes of moderate physical activity on most, preferably all, days of the week. FOR MORE INFORMATION [place] Department of Agriculture, Center for Nutrition Policy and Promotion: www.cnpp.usda.gov National Osteoporosis Foundation: www.nof.org Document Released: 01/24/2004 Document Revised: 02/28/2013 Document Reviewed: 04/25/2009 ExitCare Patient Information   2015 [employer]. This information is not intended to replace advice given to you by your health care provider. Make sure you discuss any questions you have with your health care provider.  

## 2015-03-09 LAB — CMP14+EGFR
A/G RATIO: 2.3 (ref 1.1–2.5)
ALT: 29 IU/L (ref 0–32)
AST: 26 IU/L (ref 0–40)
Albumin: 4.6 g/dL (ref 3.6–4.8)
Alkaline Phosphatase: 71 IU/L (ref 39–117)
BILIRUBIN TOTAL: 0.5 mg/dL (ref 0.0–1.2)
BUN/Creatinine Ratio: 21 (ref 11–26)
BUN: 20 mg/dL (ref 8–27)
CALCIUM: 9.9 mg/dL (ref 8.7–10.3)
CHLORIDE: 100 mmol/L (ref 97–108)
CO2: 27 mmol/L (ref 18–29)
Creatinine, Ser: 0.95 mg/dL (ref 0.57–1.00)
GFR calc Af Amer: 71 mL/min/{1.73_m2} (ref >59)
GFR, EST NON AFRICAN AMERICAN: 61 mL/min/{1.73_m2} (ref >59)
Globulin, Total: 2 g/dL (ref 1.5–4.5)
Glucose: 99 mg/dL (ref 65–99)
POTASSIUM: 4.3 mmol/L (ref 3.5–5.2)
SODIUM: 141 mmol/L (ref 134–144)
TOTAL PROTEIN: 6.6 g/dL (ref 6.0–8.5)

## 2015-03-09 LAB — NMR, LIPOPROFILE
Cholesterol: 171 mg/dL (ref 100–199)
HDL Cholesterol by NMR: 47 mg/dL (ref >39)
HDL Particle Number: 30.4 umol/L — ABNORMAL LOW (ref >=30.5)
LDL Particle Number: 1115 nmol/L — ABNORMAL HIGH (ref <1000)
LDL Size: 20.4 nm (ref >20.5)
LDL-C: 90 mg/dL (ref 0–99)
LP-IR Score: 60 — ABNORMAL HIGH (ref <=45)
Small LDL Particle Number: 577 nmol/L — ABNORMAL HIGH (ref <=527)
Triglycerides by NMR: 170 mg/dL — ABNORMAL HIGH (ref 0–149)

## 2015-03-09 LAB — VITAMIN D 25 HYDROXY (VIT D DEFICIENCY, FRACTURES): Vit D, 25-Hydroxy: 34.9 ng/mL (ref 30.0–100.0)

## 2015-03-12 ENCOUNTER — Ambulatory Visit: Payer: Medicare Other | Admitting: Family

## 2015-03-14 ENCOUNTER — Ambulatory Visit: Payer: Medicare Other | Admitting: *Deleted

## 2015-03-20 ENCOUNTER — Encounter: Payer: Self-pay | Admitting: *Deleted

## 2015-03-20 ENCOUNTER — Other Ambulatory Visit: Payer: Self-pay | Admitting: Family

## 2015-03-20 ENCOUNTER — Ambulatory Visit (INDEPENDENT_AMBULATORY_CARE_PROVIDER_SITE_OTHER): Payer: Medicare Other | Admitting: *Deleted

## 2015-03-20 VITALS — BP 136/76 | Ht 61.5 in | Wt 192.0 lb

## 2015-03-20 DIAGNOSIS — Z0000 Encounter for general adult medical examination without abnormal findings: Secondary | ICD-10-CM

## 2015-03-20 NOTE — Progress Notes (Signed)
Subjective:   Renee Maldonado is a 69 y.o. female who presents for Medicare Annual (Subsequent) preventive examination.  Renee Maldonado is feeling well today She is a retired Education officer, museum, and continues to substitute teach.  She has 3 children and 8 grandchildren.  She enjoys participating in many events in the senior olympic games, reading, and cross stitch.  She also travels to her grandchildren's soccer games.           Objective:     Vitals: BP 136/76 mmHg  Ht 5' 1.5" (1.562 m)  Wt 192 lb (87.091 kg)  BMI 35.70 kg/m2  Tobacco History  Smoking status  . Former Smoker -- 0.50 packs/day for 40 years  . Types: Cigarettes  . Quit date: 09/17/2014  Smokeless tobacco  . Not on file    Comment: electronic cigarettes x 2 weeks. She was smoking 7 cigarettes a day x 40 yrs     Counseling given: Yes   Past Medical History  Diagnosis Date  . Anxiety   . Hyperlipidemia   . Insomnia   . GERD (gastroesophageal reflux disease)   . Hypertension    Past Surgical History  Procedure Laterality Date  . Total knee arthroplasty      2012 left  . Left shoulder surgery      2007  . Joint replacement      left knee  . Tubal ligation     Family History  Problem Relation Age of Onset  . Alzheimer's disease Mother   . Stroke Father   . Hypertension Father   . Cancer Brother      Outpatient Encounter Prescriptions as of 03/20/2015  Medication Sig  . albuterol (PROVENTIL HFA;VENTOLIN HFA) 108 (90 BASE) MCG/ACT inhaler Inhale 2 puffs into the lungs every 6 (six) hours as needed for wheezing or shortness of breath.  . Biotin 1 MG CAPS Take by mouth daily.  . budesonide-formoterol (SYMBICORT) 80-4.5 MCG/ACT inhaler Inhale 2 puffs into the lungs 2 (two) times daily. (Patient taking differently: Inhale 2 puffs into the lungs 2 (two) times daily. Taking only as needed)  . Calcium-Vitamin D-Vitamin K (CALCIUM + D + K PO) Take by mouth 2 (two) times daily before a meal.  . cholecalciferol (VITAMIN  D) 1000 UNITS tablet Take 1,000 Units by mouth daily.  . fish oil-omega-3 fatty acids 1000 MG capsule Take 1 g by mouth 2 (two) times daily before a meal.  . Garlic 1194 MG CAPS Take by mouth daily.  Marland Kitchen losartan-hydrochlorothiazide (HYZAAR) 100-25 MG per tablet Take 1 tablet by mouth daily.  Marland Kitchen Lysine 500 MG TABS Take by mouth 2 (two) times daily before a meal.  . Melatonin 5 MG TABS Take 1 tablet by mouth daily.  . raloxifene (EVISTA) 60 MG tablet Take 1 tablet (60 mg total) by mouth daily.  . ranitidine (ZANTAC) 150 MG tablet Take 1 tablet (150 mg total) by mouth 2 (two) times daily.  . RESTASIS 0.05 % ophthalmic emulsion   . rosuvastatin (CRESTOR) 20 MG tablet Take 1 tablet (20 mg total) by mouth daily.  . traZODone (DESYREL) 50 MG tablet Take 1 tablet (50 mg total) by mouth at bedtime.  . TURMERIC PO Take by mouth.   Facility-Administered Encounter Medications as of 03/20/2015  Medication  . levalbuterol (XOPENEX) nebulizer solution 1.25 mg    Activities of Daily Living In your present state of health, do you have any difficulty performing the following activities: 03/20/2015 12/04/2014  Hearing? N N  Vision?  N N  Difficulty concentrating or making decisions? Y N  Walking or climbing stairs? Y N  Dressing or bathing? N N  Doing errands, shopping? N N  Preparing Food and eating ? N -  Using the Toilet? N -  In the past six months, have you accidently leaked urine? Y -  Do you have problems with loss of bowel control? N -  Managing your Medications? N -  Managing your Finances? N -  Housekeeping or managing your Housekeeping? N -    Patient Care Team: Chevis Pretty, FNP as PCP - General (Nurse Practitioner)        Exercise Activities and Dietary recommendations Current Exercise Habits:: The patient does not participate in regular exercise at present  Goals    None    Encouraged patient to start exercising - suggested walking or riding stationary bike at least 3 times  per week starting with 10 minutes and gradually increasing to 30 minutes per session.  Include mostly lean meats, vegetables, and fruits in diet       Fall Risk Fall Risk  03/20/2015 02/28/2015 12/04/2014 08/28/2014 05/17/2014  Falls in the past year? No No No No No   Depression Screen PHQ 2/9 Scores 03/20/2015 02/28/2015 12/04/2014 08/28/2014  PHQ - 2 Score 0 0 0 0    Hearing/Vision: Sees Dr. Albertine Patricia at [facility] ophthalmology - she is followed by him for a retinal disorder.  No history of glaucoma, does have mild cataracts that do not require any intervention at this time.  No hearing deficit noted.     Immunization History  Administered Date(s) Administered  . Influenza Split 07/28/2013  . Influenza-Unspecified 08/23/2014  . Pneumococcal Conjugate-13 08/23/2014  . Tdap 06/29/2013   Screening Tests Health Maintenance  Topic Date Due  . PAP SMEAR  04/07/2015 (Originally Sep 24, 1946)  . INFLUENZA VACCINE  06/18/2015  . PNA vac Low Risk Adult (2 of 2 - PPSV23) 08/24/2015  . MAMMOGRAM  12/30/2015  . COLON CANCER SCREENING ANNUAL FOBT  02/28/2016  . COLONOSCOPY  01/09/2021  . TETANUS/TDAP  06/30/2023  . DEXA SCAN  Completed  . ZOSTAVAX  Completed      Plan:    During the course of the visit the patient was educated and counseled about the following appropriate screening and preventive services:   Vaccines to include Pneumoccal, Influenza, Td, Zostavax- update  Electrocardiogram- up to date  Cardiovascular Disease- lipids screened 03/08/15 and were normal  Colorectal cancer screening- up to date  Bone density screening- up to date  Diabetes screening- glucose screened 03/08/15  Vision- Due for eye exam. She goes to [facility], patient states she will call to schedule.   Mammography/PAP- mammogram up to date, scheduled for PAP 04/03/15  Nutrition counseling - discussed today  Patient Instructions (the written plan) was given to the patient.   Renee Maldonado  M, RN  03/20/2015   I have reviewed and agree with the above AWV documentation.  Claretta Fraise, M.D.

## 2015-03-20 NOTE — Patient Instructions (Signed)
Try to increase exercise - walking or stationary bike to 10 minutes 3 times per week- and gradually increase to 30 minutes per session  Include mostly lean meats, vegetables, and fruit in your diet  [place]- call to see if you are due for an appointment [phone]   Thank you for coming in for your Annual Wellness Visit today!   Preventive Care for Adults A healthy lifestyle and preventive care can promote health and wellness. Preventive health guidelines for women include the following key practices.  A routine yearly physical is a good way to check with your health care provider about your health and preventive screening. It is a chance to share any concerns and updates on your health and to receive a thorough exam.  Visit your dentist for a routine exam and preventive care every 6 months. Brush your teeth twice a day and floss once a day. Good oral hygiene prevents tooth decay and gum disease.  The frequency of eye exams is based on your age, health, family medical history, use of contact lenses, and other factors. Follow your health care provider's recommendations for frequency of eye exams.  Eat a healthy diet. Foods like vegetables, fruits, whole grains, low-fat dairy products, and lean protein foods contain the nutrients you need without too many calories. Decrease your intake of foods high in solid fats, added sugars, and salt. Eat the right amount of calories for you.Get information about a proper diet from your health care provider, if necessary.  Regular physical exercise is one of the most important things you can do for your health. Most adults should get at least 150 minutes of moderate-intensity exercise (any activity that increases your heart rate and causes you to sweat) each week. In addition, most adults need muscle-strengthening exercises on 2 or more days a week.  Maintain a healthy weight. The body mass index (BMI) is a screening tool to identify  possible weight problems. It provides an estimate of body fat based on height and weight. Your health care provider can find your BMI and can help you achieve or maintain a healthy weight.For adults 20 years and older:  A BMI below 18.5 is considered underweight.  A BMI of 18.5 to 24.9 is normal.  A BMI of 25 to 29.9 is considered overweight.  A BMI of 30 and above is considered obese.  Maintain normal blood lipids and cholesterol levels by exercising and minimizing your intake of saturated fat. Eat a balanced diet with plenty of fruit and vegetables. Blood tests for lipids and cholesterol should begin at age 41 and be repeated every 5 years. If your lipid or cholesterol levels are high, you are over 50, or you are at high risk for heart disease, you may need your cholesterol levels checked more frequently.Ongoing high lipid and cholesterol levels should be treated with medicines if diet and exercise are not working.  If you smoke, find out from your health care provider how to quit. If you do not use tobacco, do not start.  Lung cancer screening is recommended for adults aged 4-80 years who are at high risk for developing lung cancer because of a history of smoking. A yearly low-dose CT scan of the lungs is recommended for people who have at least a 30-pack-year history of smoking and are a current smoker or have quit within the past 15 years. A pack year of smoking is smoking an average of 1 pack of cigarettes a day for 1  year (for example: 1 pack a day for 30 years or 2 packs a day for 15 years). Yearly screening should continue until the smoker has stopped smoking for at least 15 years. Yearly screening should be stopped for people who develop a health problem that would prevent them from having lung cancer treatment.  If you are pregnant, do not drink alcohol. If you are breastfeeding, be very cautious about drinking alcohol. If you are not pregnant and choose to drink alcohol, do not have  more than 1 drink per day. One drink is considered to be 12 ounces (355 mL) of beer, 5 ounces (148 mL) of wine, or 1.5 ounces (44 mL) of liquor.  Avoid use of street drugs. Do not share needles with anyone. Ask for help if you need support or instructions about stopping the use of drugs.  High blood pressure causes heart disease and increases the risk of stroke. Your blood pressure should be checked at least every 1 to 2 years. Ongoing high blood pressure should be treated with medicines if weight loss and exercise do not work.  If you are 109-37 years old, ask your health care provider if you should take aspirin to prevent strokes.  Diabetes screening involves taking a blood sample to check your fasting blood sugar level. This should be done once every 3 years, after age 81, if you are within normal weight and without risk factors for diabetes. Testing should be considered at a younger age or be carried out more frequently if you are overweight and have at least 1 risk factor for diabetes.  Breast cancer screening is essential preventive care for women. You should practice "breast self-awareness." This means understanding the normal appearance and feel of your breasts and may include breast self-examination. Any changes detected, no matter how small, should be reported to a health care provider. Women in their 95s and 30s should have a clinical breast exam (CBE) by a health care provider as part of a regular health exam every 1 to 3 years. After age 62, women should have a CBE every year. Starting at age 18, women should consider having a mammogram (breast X-ray test) every year. Women who have a family history of breast cancer should talk to their health care provider about genetic screening. Women at a high risk of breast cancer should talk to their health care providers about having an MRI and a mammogram every year.  Breast cancer gene (BRCA)-related cancer risk assessment is recommended for women  who have family members with BRCA-related cancers. BRCA-related cancers include breast, ovarian, tubal, and peritoneal cancers. Having family members with these cancers may be associated with an increased risk for harmful changes (mutations) in the breast cancer genes BRCA1 and BRCA2. Results of the assessment will determine the need for genetic counseling and BRCA1 and BRCA2 testing.  Routine pelvic exams to screen for cancer are no longer recommended for nonpregnant women who are considered low risk for cancer of the pelvic organs (ovaries, uterus, and vagina) and who do not have symptoms. Ask your health care provider if a screening pelvic exam is right for you.  If you have had past treatment for cervical cancer or a condition that could lead to cancer, you need Pap tests and screening for cancer for at least 20 years after your treatment. If Pap tests have been discontinued, your risk factors (such as having a new sexual partner) need to be reassessed to determine if screening should be resumed. Some women  have medical problems that increase the chance of getting cervical cancer. In these cases, your health care provider may recommend more frequent screening and Pap tests.  The HPV test is an additional test that may be used for cervical cancer screening. The HPV test looks for the virus that can cause the cell changes on the cervix. The cells collected during the Pap test can be tested for HPV. The HPV test could be used to screen women aged 58 years and older, and should be used in women of any age who have unclear Pap test results. After the age of 43, women should have HPV testing at the same frequency as a Pap test.  Colorectal cancer can be detected and often prevented. Most routine colorectal cancer screening begins at the age of 25 years and continues through age 43 years. However, your health care provider may recommend screening at an earlier age if you have risk factors for colon cancer. On a  yearly basis, your health care provider may provide home test kits to check for hidden blood in the stool. Use of a small camera at the end of a tube, to directly examine the colon (sigmoidoscopy or colonoscopy), can detect the earliest forms of colorectal cancer. Talk to your health care provider about this at age 53, when routine screening begins. Direct exam of the colon should be repeated every 5-10 years through age 67 years, unless early forms of pre-cancerous polyps or small growths are found.  People who are at an increased risk for hepatitis B should be screened for this virus. You are considered at high risk for hepatitis B if:  You were born in a country where hepatitis B occurs often. Talk with your health care provider about which countries are considered high risk.  Your parents were born in a high-risk country and you have not received a shot to protect against hepatitis B (hepatitis B vaccine).  You have HIV or AIDS.  You use needles to inject street drugs.  You live with, or have sex with, someone who has hepatitis B.  You get hemodialysis treatment.  You take certain medicines for conditions like cancer, organ transplantation, and autoimmune conditions.  Hepatitis C blood testing is recommended for all people born from [birth year] through 1965 and any individual with known risks for hepatitis C.  Practice safe sex. Use condoms and avoid high-risk sexual practices to reduce the spread of sexually transmitted infections (STIs). STIs include gonorrhea, chlamydia, syphilis, trichomonas, herpes, HPV, and human immunodeficiency virus (HIV). Herpes, HIV, and HPV are viral illnesses that have no cure. They can result in disability, cancer, and death.  You should be screened for sexually transmitted illnesses (STIs) including gonorrhea and chlamydia if:  You are sexually active and are younger than 24 years.  You are older than 24 years and your health care provider tells you that you  are at risk for this type of infection.  Your sexual activity has changed since you were last screened and you are at an increased risk for chlamydia or gonorrhea. Ask your health care provider if you are at risk.  If you are at risk of being infected with HIV, it is recommended that you take a prescription medicine daily to prevent HIV infection. This is called preexposure prophylaxis (PrEP). You are considered at risk if:  You are a heterosexual woman, are sexually active, and are at increased risk for HIV infection.  You take drugs by injection.  You are sexually active  with a partner who has HIV.  Talk with your health care provider about whether you are at high risk of being infected with HIV. If you choose to begin PrEP, you should first be tested for HIV. You should then be tested every 3 months for as long as you are taking PrEP.  Osteoporosis is a disease in which the bones lose minerals and strength with aging. This can result in serious bone fractures or breaks. The risk of osteoporosis can be identified using a bone density scan. Women ages 28 years and over and women at risk for fractures or osteoporosis should discuss screening with their health care providers. Ask your health care provider whether you should take a calcium supplement or vitamin D to reduce the rate of osteoporosis.  Menopause can be associated with physical symptoms and risks. Hormone replacement therapy is available to decrease symptoms and risks. You should talk to your health care provider about whether hormone replacement therapy is right for you.  Use sunscreen. Apply sunscreen liberally and repeatedly throughout the day. You should seek shade when your shadow is shorter than you. Protect yourself by wearing long sleeves, pants, a wide-brimmed hat, and sunglasses year round, whenever you are outdoors.  Once a month, do a whole body skin exam, using a mirror to look at the skin on your back. Tell your health  care provider of new moles, moles that have irregular borders, moles that are larger than a pencil eraser, or moles that have changed in shape or color.  Stay current with required vaccines (immunizations).  Influenza vaccine. All adults should be immunized every year.  Tetanus, diphtheria, and acellular pertussis (Td, Tdap) vaccine. Pregnant women should receive 1 dose of Tdap vaccine during each pregnancy. The dose should be obtained regardless of the length of time since the last dose. Immunization is preferred during the 27th-36th week of gestation. An adult who has not previously received Tdap or who does not know her vaccine status should receive 1 dose of Tdap. This initial dose should be followed by tetanus and diphtheria toxoids (Td) booster doses every 10 years. Adults with an unknown or incomplete history of completing a 3-dose immunization series with Td-containing vaccines should begin or complete a primary immunization series including a Tdap dose. Adults should receive a Td booster every 10 years.  Varicella vaccine. An adult without evidence of immunity to varicella should receive 2 doses or a second dose if she has previously received 1 dose. Pregnant females who do not have evidence of immunity should receive the first dose after pregnancy. This first dose should be obtained before leaving the health care facility. The second dose should be obtained 4-8 weeks after the first dose.  Human papillomavirus (HPV) vaccine. Females aged 13-26 years who have not received the vaccine previously should obtain the 3-dose series. The vaccine is not recommended for use in pregnant females. However, pregnancy testing is not needed before receiving a dose. If a female is found to be pregnant after receiving a dose, no treatment is needed. In that case, the remaining doses should be delayed until after the pregnancy. Immunization is recommended for any person with an immunocompromised condition through  the age of 76 years if she did not get any or all doses earlier. During the 3-dose series, the second dose should be obtained 4-8 weeks after the first dose. The third dose should be obtained 24 weeks after the first dose and 16 weeks after the second dose.  Zoster  vaccine. One dose is recommended for adults aged 73 years or older unless certain conditions are present.  Measles, mumps, and rubella (MMR) vaccine. Adults born before 60 generally are considered immune to measles and mumps. Adults born in [birth year] or later should have 1 or more doses of MMR vaccine unless there is a contraindication to the vaccine or there is laboratory evidence of immunity to each of the three diseases. A routine second dose of MMR vaccine should be obtained at least 28 days after the first dose for students attending postsecondary schools, health care workers, or international travelers. People who received inactivated measles vaccine or an unknown type of measles vaccine during 1963-1967 should receive 2 doses of MMR vaccine. People who received inactivated mumps vaccine or an unknown type of mumps vaccine before 1979 and are at high risk for mumps infection should consider immunization with 2 doses of MMR vaccine. For females of childbearing age, rubella immunity should be determined. If there is no evidence of immunity, females who are not pregnant should be vaccinated. If there is no evidence of immunity, females who are pregnant should delay immunization until after pregnancy. Unvaccinated health care workers born before [birth year] who lack laboratory evidence of measles, mumps, or rubella immunity or laboratory confirmation of disease should consider measles and mumps immunization with 2 doses of MMR vaccine or rubella immunization with 1 dose of MMR vaccine.  Pneumococcal 13-valent conjugate (PCV13) vaccine. When indicated, a person who is uncertain of her immunization history and has no record of immunization should receive the  PCV13 vaccine. An adult aged 50 years or older who has certain medical conditions and has not been previously immunized should receive 1 dose of PCV13 vaccine. This PCV13 should be followed with a dose of pneumococcal polysaccharide (PPSV23) vaccine. The PPSV23 vaccine dose should be obtained at least 8 weeks after the dose of PCV13 vaccine. An adult aged 44 years or older who has certain medical conditions and previously received 1 or more doses of PPSV23 vaccine should receive 1 dose of PCV13. The PCV13 vaccine dose should be obtained 1 or more years after the last PPSV23 vaccine dose.  Pneumococcal polysaccharide (PPSV23) vaccine. When PCV13 is also indicated, PCV13 should be obtained first. All adults aged 66 years and older should be immunized. An adult younger than age 52 years who has certain medical conditions should be immunized. Any person who resides in a nursing home or long-term care facility should be immunized. An adult smoker should be immunized. People with an immunocompromised condition and certain other conditions should receive both PCV13 and PPSV23 vaccines. People with human immunodeficiency virus (HIV) infection should be immunized as soon as possible after diagnosis. Immunization during chemotherapy or radiation therapy should be avoided. Routine use of PPSV23 vaccine is not recommended for American Indians, [place] Natives, or people younger than 65 years unless there are medical conditions that require PPSV23 vaccine. When indicated, people who have unknown immunization and have no record of immunization should receive PPSV23 vaccine. One-time revaccination 5 years after the first dose of PPSV23 is recommended for people aged 19-64 years who have chronic kidney failure, nephrotic syndrome, asplenia, or immunocompromised conditions. People who received 1-2 doses of PPSV23 before age 24 years should receive another dose of PPSV23 vaccine at age 28 years or later if at least 5 years have  passed since the previous dose. Doses of PPSV23 are not needed for people immunized with PPSV23 at or after age 40 years.  Meningococcal vaccine.  Adults with asplenia or persistent complement component deficiencies should receive 2 doses of quadrivalent meningococcal conjugate (MenACWY-D) vaccine. The doses should be obtained at least 2 months apart. Microbiologists working with certain meningococcal bacteria, [employer] recruits, people at risk during an outbreak, and people who travel to or live in countries with a high rate of meningitis should be immunized. A first-year college student up through age 42 years who is living in a residence hall should receive a dose if she did not receive a dose on or after her 16th birthday. Adults who have certain high-risk conditions should receive one or more doses of vaccine.  Hepatitis A vaccine. Adults who wish to be protected from this disease, have certain high-risk conditions, work with hepatitis A-infected animals, work in hepatitis A research labs, or travel to or work in countries with a high rate of hepatitis A should be immunized. Adults who were previously unvaccinated and who anticipate close contact with an international adoptee during the first 60 days after arrival in the [place] from a country with a high rate of hepatitis A should be immunized.  Hepatitis B vaccine. Adults who wish to be protected from this disease, have certain high-risk conditions, may be exposed to blood or other infectious body fluids, are household contacts or sex partners of hepatitis B positive people, are clients or workers in certain care facilities, or travel to or work in countries with a high rate of hepatitis B should be immunized.  Haemophilus influenzae type b (Hib) vaccine. A previously unvaccinated person with asplenia or sickle cell disease or having a scheduled splenectomy should receive 1 dose of Hib vaccine. Regardless of previous immunization, a recipient of  a hematopoietic stem cell transplant should receive a 3-dose series 6-12 months after her successful transplant. Hib vaccine is not recommended for adults with HIV infection. Preventive Services / Frequency Ages 85 to 60 years  Blood pressure check.** / Every 1 to 2 years.  Lipid and cholesterol check.** / Every 5 years beginning at age 41.  Clinical breast exam.** / Every 3 years for women in their 26s and 52s.  BRCA-related cancer risk assessment.** / For women who have family members with a BRCA-related cancer (breast, ovarian, tubal, or peritoneal cancers).  Pap test.** / Every 2 years from ages 23 through 68. Every 3 years starting at age 41 through age 66 or 32 with a history of 3 consecutive normal Pap tests.  HPV screening.** / Every 3 years from ages 29 through ages 6 to 23 with a history of 3 consecutive normal Pap tests.  Hepatitis C blood test.** / For any individual with known risks for hepatitis C.  Skin self-exam. / Monthly.  Influenza vaccine. / Every year.  Tetanus, diphtheria, and acellular pertussis (Tdap, Td) vaccine.** / Consult your health care provider. Pregnant women should receive 1 dose of Tdap vaccine during each pregnancy. 1 dose of Td every 10 years.  Varicella vaccine.** / Consult your health care provider. Pregnant females who do not have evidence of immunity should receive the first dose after pregnancy.  HPV vaccine. / 3 doses over 6 months, if 9 and younger. The vaccine is not recommended for use in pregnant females. However, pregnancy testing is not needed before receiving a dose.  Measles, mumps, rubella (MMR) vaccine.** / You need at least 1 dose of MMR if you were born in 1957 or later. You may also need a 2nd dose. For females of childbearing age, rubella immunity should be determined.  If there is no evidence of immunity, females who are not pregnant should be vaccinated. If there is no evidence of immunity, females who are pregnant should delay  immunization until after pregnancy.  Pneumococcal 13-valent conjugate (PCV13) vaccine.** / Consult your health care provider.  Pneumococcal polysaccharide (PPSV23) vaccine.** / 1 to 2 doses if you smoke cigarettes or if you have certain conditions.  Meningococcal vaccine.** / 1 dose if you are age 45 to 51 years and a Market researcher living in a residence hall, or have one of several medical conditions, you need to get vaccinated against meningococcal disease. You may also need additional booster doses.  Hepatitis A vaccine.** / Consult your health care provider.  Hepatitis B vaccine.** / Consult your health care provider.  Haemophilus influenzae type b (Hib) vaccine.** / Consult your health care provider. Ages 51 to 37 years  Blood pressure check.** / Every 1 to 2 years.  Lipid and cholesterol check.** / Every 5 years beginning at age 74 years.  Lung cancer screening. / Every year if you are aged 53-80 years and have a 30-pack-year history of smoking and currently smoke or have quit within the past 15 years. Yearly screening is stopped once you have quit smoking for at least 15 years or develop a health problem that would prevent you from having lung cancer treatment.  Clinical breast exam.** / Every year after age 58 years.  BRCA-related cancer risk assessment.** / For women who have family members with a BRCA-related cancer (breast, ovarian, tubal, or peritoneal cancers).  Mammogram.** / Every year beginning at age 84 years and continuing for as long as you are in good health. Consult with your health care provider.  Pap test.** / Every 3 years starting at age 64 years through age 24 or 74 years with a history of 3 consecutive normal Pap tests.  HPV screening.** / Every 3 years from ages 78 years through ages 68 to 55 years with a history of 3 consecutive normal Pap tests.  Fecal occult blood test (FOBT) of stool. / Every year beginning at age 107 years and continuing  until age 25 years. You may not need to do this test if you get a colonoscopy every 10 years.  Flexible sigmoidoscopy or colonoscopy.** / Every 5 years for a flexible sigmoidoscopy or every 10 years for a colonoscopy beginning at age 30 years and continuing until age 28 years.  Hepatitis C blood test.** / For all people born from [birth year] through 1966 and any individual with known risks for hepatitis C.  Skin self-exam. / Monthly.  Influenza vaccine. / Every year.  Tetanus, diphtheria, and acellular pertussis (Tdap/Td) vaccine.** / Consult your health care provider. Pregnant women should receive 1 dose of Tdap vaccine during each pregnancy. 1 dose of Td every 10 years.  Varicella vaccine.** / Consult your health care provider. Pregnant females who do not have evidence of immunity should receive the first dose after pregnancy.  Zoster vaccine.** / 1 dose for adults aged 40 years or older.  Measles, mumps, rubella (MMR) vaccine.** / You need at least 1 dose of MMR if you were born in 1958 or later. You may also need a 2nd dose. For females of childbearing age, rubella immunity should be determined. If there is no evidence of immunity, females who are not pregnant should be vaccinated. If there is no evidence of immunity, females who are pregnant should delay immunization until after pregnancy.  Pneumococcal 13-valent conjugate (PCV13) vaccine.** / Consult  your health care provider.  Pneumococcal polysaccharide (PPSV23) vaccine.** / 1 to 2 doses if you smoke cigarettes or if you have certain conditions.  Meningococcal vaccine.** / Consult your health care provider.  Hepatitis A vaccine.** / Consult your health care provider.  Hepatitis B vaccine.** / Consult your health care provider.  Haemophilus influenzae type b (Hib) vaccine.** / Consult your health care provider. Ages 55 years and over  Blood pressure check.** / Every 1 to 2 years.  Lipid and cholesterol check.** / Every 5 years  beginning at age 6 years.  Lung cancer screening. / Every year if you are aged 29-80 years and have a 30-pack-year history of smoking and currently smoke or have quit within the past 15 years. Yearly screening is stopped once you have quit smoking for at least 15 years or develop a health problem that would prevent you from having lung cancer treatment.  Clinical breast exam.** / Every year after age 74 years.  BRCA-related cancer risk assessment.** / For women who have family members with a BRCA-related cancer (breast, ovarian, tubal, or peritoneal cancers).  Mammogram.** / Every year beginning at age 13 years and continuing for as long as you are in good health. Consult with your health care provider.  Pap test.** / Every 3 years starting at age 19 years through age 68 or 81 years with 3 consecutive normal Pap tests. Testing can be stopped between 65 and 70 years with 3 consecutive normal Pap tests and no abnormal Pap or HPV tests in the past 10 years.  HPV screening.** / Every 3 years from ages 74 years through ages 39 or 70 years with a history of 3 consecutive normal Pap tests. Testing can be stopped between 65 and 70 years with 3 consecutive normal Pap tests and no abnormal Pap or HPV tests in the past 10 years.  Fecal occult blood test (FOBT) of stool. / Every year beginning at age 22 years and continuing until age 18 years. You may not need to do this test if you get a colonoscopy every 10 years.  Flexible sigmoidoscopy or colonoscopy.** / Every 5 years for a flexible sigmoidoscopy or every 10 years for a colonoscopy beginning at age 28 years and continuing until age 34 years.  Hepatitis C blood test.** / For all people born from [birth year] through 1966 and any individual with known risks for hepatitis C.  Osteoporosis screening.** / A one-time screening for women ages 37 years and over and women at risk for fractures or osteoporosis.  Skin self-exam. / Monthly.  Influenza vaccine. /  Every year.  Tetanus, diphtheria, and acellular pertussis (Tdap/Td) vaccine.** / 1 dose of Td every 10 years.  Varicella vaccine.** / Consult your health care provider.  Zoster vaccine.** / 1 dose for adults aged 58 years or older.  Pneumococcal 13-valent conjugate (PCV13) vaccine.** / Consult your health care provider.  Pneumococcal polysaccharide (PPSV23) vaccine.** / 1 dose for all adults aged 70 years and older.  Meningococcal vaccine.** / Consult your health care provider.  Hepatitis A vaccine.** / Consult your health care provider.  Hepatitis B vaccine.** / Consult your health care provider.  Haemophilus influenzae type b (Hib) vaccine.** / Consult your health care provider. ** Family history and personal history of risk and conditions may change your health care provider's recommendations. Document Released: 12/30/2001 Document Revised: 03/20/2014 Document Reviewed: 03/31/2011 [facility] Patient Information 2015 [place], [place]. This information is not intended to replace advice given to you by your health care  provider. Make sure you discuss any questions you have with your health care provider.  Fall Prevention and Home Safety Falls cause injuries and can affect all age groups. It is possible to use preventive measures to significantly decrease the likelihood of falls. There are many simple measures which can make your home safer and prevent falls. OUTDOORS  Repair cracks and edges of walkways and driveways.  Remove high doorway thresholds.  Trim shrubbery on the main path into your home.  Have good outside lighting.  Clear walkways of tools, rocks, debris, and clutter.  Check that handrails are not broken and are securely fastened. Both sides of steps should have handrails.  Have leaves, snow, and ice cleared regularly.  Use sand or salt on walkways during winter months.  In the garage, clean up grease or oil spills. BATHROOM  Install night lights.  Install  grab bars by the toilet and in the tub and shower.  Use non-skid mats or decals in the tub or shower.  Place a plastic non-slip stool in the shower to sit on, if needed.  Keep floors dry and clean up all water on the floor immediately.  Remove soap buildup in the tub or shower on a regular basis.  Secure bath mats with non-slip, double-sided rug tape.  Remove throw rugs and tripping hazards from the floors. BEDROOMS  Install night lights.  Make sure a bedside light is easy to reach.  Do not use oversized bedding.  Keep a telephone by your bedside.  Have a firm chair with side arms to use for getting dressed.  Remove throw rugs and tripping hazards from the floor. KITCHEN  Keep handles on pots and pans turned toward the center of the stove. Use back burners when possible.  Clean up spills quickly and allow time for drying.  Avoid walking on wet floors.  Avoid hot utensils and knives.  Position shelves so they are not too high or low.  Place commonly used objects within easy reach.  If necessary, use a sturdy step stool with a grab bar when reaching.  Keep electrical cables out of the way.  Do not use floor polish or wax that makes floors slippery. If you must use wax, use non-skid floor wax.  Remove throw rugs and tripping hazards from the floor. STAIRWAYS  Never leave objects on stairs.  Place handrails on both sides of stairways and use them. Fix any loose handrails. Make sure handrails on both sides of the stairways are as long as the stairs.  Check carpeting to make sure it is firmly attached along stairs. Make repairs to worn or loose carpet promptly.  Avoid placing throw rugs at the top or bottom of stairways, or properly secure the rug with carpet tape to prevent slippage. Get rid of throw rugs, if possible.  Have an electrician put in a light switch at the top and bottom of the stairs. OTHER FALL PREVENTION TIPS  Wear low-heel or rubber-soled shoes  that are supportive and fit well. Wear closed toe shoes.  When using a stepladder, make sure it is fully opened and both spreaders are firmly locked. Do not climb a closed stepladder.  Add color or contrast paint or tape to grab bars and handrails in your home. Place contrasting color strips on first and last steps.  Learn and use mobility aids as needed. Install an electrical emergency response system.  Turn on lights to avoid dark areas. Replace light bulbs that burn out immediately. Get light  switches that glow.  Arrange furniture to create clear pathways. Keep furniture in the same place.  Firmly attach carpet with non-skid or double-sided tape.  Eliminate uneven floor surfaces.  Select a carpet pattern that does not visually hide the edge of steps.  Be aware of all pets. OTHER HOME SAFETY TIPS  Set the water temperature for 120 F (48.8 C).  Keep emergency numbers on or near the telephone.  Keep smoke detectors on every level of the home and near sleeping areas. Document Released: 10/24/2002 Document Revised: 05/04/2012 Document Reviewed: 01/23/2012 [facility] Patient Information 2015 [place], [place]. This information is not intended to replace advice given to you by your health care provider. Make sure you discuss any questions you have with your health care provider.

## 2015-03-21 ENCOUNTER — Encounter: Payer: Self-pay | Admitting: Family Medicine

## 2015-03-21 ENCOUNTER — Ambulatory Visit (INDEPENDENT_AMBULATORY_CARE_PROVIDER_SITE_OTHER): Payer: Medicare Other | Admitting: Family Medicine

## 2015-03-21 VITALS — BP 148/76 | Temp 98.5°F | Ht 61.5 in | Wt 194.4 lb

## 2015-03-21 DIAGNOSIS — J301 Allergic rhinitis due to pollen: Secondary | ICD-10-CM | POA: Diagnosis not present

## 2015-03-21 MED ORDER — TRIAMCINOLONE ACETONIDE 55 MCG/ACT NA AERO: 2.0000 | INHALATION_SPRAY | Freq: Every day | NASAL | Status: DC

## 2015-03-21 MED ORDER — BENZONATATE 100 MG PO CAPS: 100.0000 mg | ORAL_CAPSULE | Freq: Two times a day (BID) | ORAL | Status: DC | PRN

## 2015-03-21 NOTE — Progress Notes (Signed)
Subjective:    Patient ID: Renee Maldonado, female    DOB: 04/27/1946, 69 y.o.   MRN: [id number]  HPI 16-y she does not have a history of chronic sinusitis. She thinks this may be seasonal and allergic. ear-old female with upper respiratory symptoms for the last week. She has been taking Chlor-Trimeton and Zyrtec and her symptoms seem to be improving. She has some cough but the color has gotten lighter over the past few days. She denies any wheezing or shortness of breath fever or chills.  Patient Active Problem List   Diagnosis Date Noted  . Initial Medicare annual wellness visit 03/20/2015  . BMI 34.0-34.9,adult 03/08/2015  . Vitamin D deficiency 03/08/2015  . COPD bronchitis 08/11/2014  . Fall 12/19/2013  . Heme positive stool 09/14/2013  . Hyperlipidemia 06/29/2013  . Hypertension 06/29/2013  . GAD (generalized anxiety disorder) 06/29/2013  . Diverticulitis of colon 06/29/2013  . GERD (gastroesophageal reflux disease) 06/29/2013  . Insomnia 06/29/2013  . Osteopenia 06/29/2013   Outpatient Encounter Prescriptions as of 03/21/2015  Medication Sig  . albuterol (PROVENTIL HFA;VENTOLIN HFA) 108 (90 BASE) MCG/ACT inhaler Inhale 2 puffs into the lungs every 6 (six) hours as needed for wheezing or shortness of breath.  . Biotin 1 MG CAPS Take by mouth daily.  . budesonide-formoterol (SYMBICORT) 80-4.5 MCG/ACT inhaler Inhale 2 puffs into the lungs 2 (two) times daily. (Patient taking differently: Inhale 2 puffs into the lungs 2 (two) times daily as needed. Taking only as needed)  . Calcium-Vitamin D-Vitamin K (CALCIUM + D + K PO) Take by mouth 2 (two) times daily before a meal.  . cholecalciferol (VITAMIN D) 1000 UNITS tablet Take 1,000 Units by mouth daily.  . fish oil-omega-3 fatty acids 1000 MG capsule Take 1 g by mouth 2 (two) times daily before a meal.  . Garlic 3500 MG CAPS Take by mouth daily.  Marland Kitchen losartan-hydrochlorothiazide (HYZAAR) 100-25 MG per tablet Take 1 tablet by mouth daily.    Marland Kitchen Lysine 500 MG TABS Take by mouth 2 (two) times daily before a meal.  . Melatonin 5 MG TABS Take 1 tablet by mouth daily.  . raloxifene (EVISTA) 60 MG tablet Take 1 tablet (60 mg total) by mouth daily.  . ranitidine (ZANTAC) 150 MG tablet Take 1 tablet (150 mg total) by mouth 2 (two) times daily.  . RESTASIS 0.05 % ophthalmic emulsion Place 1 drop into both eyes 2 (two) times daily as needed.   . rosuvastatin (CRESTOR) 20 MG tablet Take 1 tablet (20 mg total) by mouth daily.  . traZODone (DESYREL) 50 MG tablet Take 1 tablet (50 mg total) by mouth at bedtime.  . TURMERIC PO Take by mouth.   Facility-Administered Encounter Medications as of 03/21/2015  Medication  . levalbuterol (XOPENEX) nebulizer solution 1.25 mg      Review of Systems  Constitutional: Negative.   HENT: Positive for congestion and postnasal drip.   Respiratory: Positive for cough.   Cardiovascular: Negative.   Neurological: Negative.   Psychiatric/Behavioral: Negative.        Objective:   Physical Exam  Constitutional: She appears well-developed and well-nourished.  HENT:  Head: Normocephalic.  Right Ear: External ear normal.  Left Ear: External ear normal.  Nose: Nose normal.  Mouth/Throat: Oropharynx is clear and moist.  Pulmonary/Chest: Effort normal and breath sounds normal.          Assessment & Plan:  1. Allergic rhinitis due to pollen Continue with Zyrtec and I will add a  topical nasal steroid and Tessalon for symptomatic relief Drink plenty of fluids. Let us know if symptoms worsen or do not improve  Wardell Honour MD

## 2015-03-21 NOTE — Telephone Encounter (Signed)
Last seen 03/08/15 MMM

## 2015-04-03 ENCOUNTER — Encounter (INDEPENDENT_AMBULATORY_CARE_PROVIDER_SITE_OTHER): Payer: Self-pay

## 2015-04-03 ENCOUNTER — Ambulatory Visit (INDEPENDENT_AMBULATORY_CARE_PROVIDER_SITE_OTHER): Payer: Medicare Other | Admitting: Nurse Practitioner

## 2015-04-03 ENCOUNTER — Encounter: Payer: Self-pay | Admitting: Nurse Practitioner

## 2015-04-03 DIAGNOSIS — Z01419 Encounter for gynecological examination (general) (routine) without abnormal findings: Secondary | ICD-10-CM | POA: Diagnosis not present

## 2015-04-03 LAB — POCT URINALYSIS DIPSTICK
Bilirubin, UA: NEGATIVE
Blood, UA: NEGATIVE
Glucose, UA: NEGATIVE
KETONES UA: NEGATIVE
LEUKOCYTES UA: NEGATIVE
Nitrite, UA: NEGATIVE
PH UA: 6
Protein, UA: NEGATIVE
SPEC GRAV UA: 1.02
UROBILINOGEN UA: NEGATIVE

## 2015-04-03 LAB — POCT UA - MICROSCOPIC ONLY
Bacteria, U Microscopic: NEGATIVE
CASTS, UR, LPF, POC: NEGATIVE
CRYSTALS, UR, HPF, POC: NEGATIVE
Mucus, UA: NEGATIVE
RBC, urine, microscopic: NEGATIVE
Yeast, UA: NEGATIVE

## 2015-04-03 NOTE — Patient Instructions (Signed)
Pap Test A Pap test is a procedure done in a clinic office to evaluate cells that are on the surface of the cervix. The cervix is the lower portion of the uterus and upper portion of the vagina. For some women, the cervical region has the potential to form cancer. With consistent evaluations by your caregiver, this type of cancer can be prevented.  If a Pap test is abnormal, it is most often a result of a previous exposure to human papillomavirus (HPV). HPV is a virus that can infect the cells of the cervix and cause dysplasia. Dysplasia is where the cells no longer look normal. If a woman has been diagnosed with high-grade or severe dysplasia, they are at higher risk of developing cervical cancer. People diagnosed with low-grade dysplasia should still be seen by their caregiver because there is a small chance that low-grade dysplasia could develop into cancer.  LET YOUR CAREGIVER KNOW ABOUT:  Recent sexually transmitted infection (STI) you have had.  Any new sex partners you have had.  History of previous abnormal Pap tests results.  History of previous cervical procedures you have had (colposcopy, biopsy, loop electrosurgical excision procedure [LEEP]).  Concerns you have had regarding unusual vaginal discharge.  History of pelvic pain.  Your use of birth control. BEFORE THE PROCEDURE  Ask your caregiver when to schedule your Pap test. It is best not to be on your period if your caregiver uses a wooden spatula to collect cells or applies cells to a glass slide. Newer techniques are not so sensitive to the timing of a menstrual cycle.  Do not douche or have sexual intercourse for 24 hours before the test.   Do not use vaginal creams or tampons for 24 hours before the test.   Empty your bladder just before the test to lessen any discomfort.  PROCEDURE You will lie on an exam table with your feet in stirrups. A warm metal or plastic instrument (speculum) is placed in your vagina. This  instrument allows your caregiver to see the inside of your vagina and look at your cervix. A small, plastic brush or wooden spatula is then used to collect cervical cells. These cells are placed in a lab specimen container. The cells are looked at under a microscope. A specialist will determine if the cells are normal.  AFTER THE PROCEDURE Make sure to get your test results.If your results come back abnormal, you may need further testing.  Document Released: 01/24/2003 Document Revised: 01/26/2012 Document Reviewed: 10/30/2011 [facility] Patient Information 2015 [place], [place]. This information is not intended to replace advice given to you by your health care provider. Make sure you discuss any questions you have with your health care provider.

## 2015-04-03 NOTE — Progress Notes (Signed)
   Subjective:    Patient ID: Renee Maldonado, female    DOB: 03/18/1946, 69 y.o.   MRN: [id number]  HPI Patient here today for annual physical exam and pap- SHe was seen for routine follow up at end of April and has been doing well since then- She has no complaints today.    Review of Systems  Constitutional: Negative.   HENT: Negative.   Respiratory: Negative.   Gastrointestinal: Negative.   Genitourinary: Negative.   Neurological: Negative.   Psychiatric/Behavioral: Negative.   All other systems reviewed and are negative.      Objective:   Physical Exam  Constitutional: She is oriented to person, place, and time. She appears well-developed and well-nourished.  HENT:  Head: Normocephalic.  Right Ear: Hearing, tympanic membrane, external ear and ear canal normal.  Left Ear: Hearing, tympanic membrane, external ear and ear canal normal.  Nose: Nose normal.  Mouth/Throat: Uvula is midline and oropharynx is clear and moist.  Eyes: Conjunctivae and EOM are normal. Pupils are equal, round, and reactive to light.  Neck: Normal range of motion and full passive range of motion without pain. Neck supple. No JVD present. Carotid bruit is not present. No thyroid mass and no thyromegaly present.  Cardiovascular: Normal rate, normal heart sounds and intact distal pulses.   No murmur heard. Pulmonary/Chest: Effort normal and breath sounds normal. Right breast exhibits no inverted nipple, no mass, no nipple discharge, no skin change and no tenderness. Left breast exhibits no inverted nipple, no mass, no nipple discharge, no skin change and no tenderness.  Abdominal: Soft. Bowel sounds are normal. She exhibits no mass. There is no tenderness.  Genitourinary: Vagina normal and uterus normal. No breast swelling, tenderness, discharge or bleeding.  bimanual exam-No adnexal masses or tenderness. Cervix parous and pink- no discharge  Musculoskeletal: Normal range of motion.  Lymphadenopathy:    She  has no cervical adenopathy.  Neurological: She is alert and oriented to person, place, and time.  Skin: Skin is warm and dry.  Psychiatric: She has a normal mood and affect. Her behavior is normal. Judgment and thought content normal.   BP 128/64 mmHg  Temp(Src) 97.2 F (36.2 C) (Oral)  Ht '5\' 1"'$  (1.549 m)  Wt 191 lb (86.637 kg)  BMI 36.11 kg/m2   Results for orders placed or performed in visit on 04/03/15  POCT UA - Microscopic Only  Result Value Ref Range   WBC, Ur, HPF, POC occ    RBC, urine, microscopic neg    Bacteria, U Microscopic neg    Mucus, UA neg    Epithelial cells, urine per micros occ    Crystals, Ur, HPF, POC neg    Casts, Ur, LPF, POC neg    Yeast, UA neg   POCT urinalysis dipstick  Result Value Ref Range   Color, UA yellow    Clarity, UA clear    Glucose, UA neg    Bilirubin, UA neg    Ketones, UA neg    Spec Grav, UA 1.020    Blood, UA neg    pH, UA 6.0    Protein, UA neg    Urobilinogen, UA negative    Nitrite, UA neg    Leukocytes, UA Negative         Assessment & Plan:  1. Encounter for routine gynecological examination - POCT UA - Microscopic Only - POCT urinalysis dipstick - Pap IG (Image Guided)  Follow up in 2-55month  Mary-Margaret Bryce Kimble, FNP

## 2015-04-05 LAB — PAP IG (IMAGE GUIDED): PAP Smear Comment: 0

## 2015-06-07 ENCOUNTER — Ambulatory Visit: Payer: Medicare Other | Admitting: Nurse Practitioner

## 2015-06-16 ENCOUNTER — Ambulatory Visit (INDEPENDENT_AMBULATORY_CARE_PROVIDER_SITE_OTHER): Payer: Medicare Other | Admitting: Family Medicine

## 2015-06-16 VITALS — BP 173/96 | HR 76 | Temp 97.4°F | Ht 61.0 in | Wt 196.0 lb

## 2015-06-16 DIAGNOSIS — B9689 Other specified bacterial agents as the cause of diseases classified elsewhere: Secondary | ICD-10-CM

## 2015-06-16 DIAGNOSIS — W57XXXA Bitten or stung by nonvenomous insect and other nonvenomous arthropods, initial encounter: Secondary | ICD-10-CM | POA: Diagnosis not present

## 2015-06-16 DIAGNOSIS — J029 Acute pharyngitis, unspecified: Secondary | ICD-10-CM | POA: Diagnosis not present

## 2015-06-16 DIAGNOSIS — S50911A Unspecified superficial injury of right forearm, initial encounter: Secondary | ICD-10-CM | POA: Diagnosis not present

## 2015-06-16 DIAGNOSIS — J028 Acute pharyngitis due to other specified organisms: Principal | ICD-10-CM

## 2015-06-16 LAB — POCT RAPID STREP A (OFFICE): RAPID STREP A SCREEN: NEGATIVE

## 2015-06-16 MED ORDER — BETAMETHASONE SOD PHOS & ACET 6 (3-3) MG/ML IJ SUSP
6.0000 mg | Freq: Once | INTRAMUSCULAR | Status: AC
  Administered 2015-06-16: 6 mg via INTRAMUSCULAR

## 2015-06-16 MED ORDER — LEVOFLOXACIN 500 MG PO TABS: 500.0000 mg | ORAL_TABLET | Freq: Every day | ORAL | Status: DC

## 2015-06-16 MED ORDER — BENZONATATE 100 MG PO CAPS: 100.0000 mg | ORAL_CAPSULE | Freq: Three times a day (TID) | ORAL | Status: DC | PRN

## 2015-06-16 NOTE — Progress Notes (Signed)
Subjective:  Patient ID: Renee Maldonado, female    DOB: 20-Nov-1945  Age: 69 y.o. MRN: [id number]  CC: Insect Bite; Cough; Nasal Congestion; Sore Throat; and watery eyes   HPI Renee Maldonado presents for red spot on right forearm. Also sx as above. Onset 1 day ago. Subjective fever. Increasingly severe sore throat pain.  History Renee Maldonado has a past medical history of Anxiety; Hyperlipidemia; Insomnia; GERD (gastroesophageal reflux disease); and Hypertension.   She has past surgical history that includes Total knee arthroplasty; Left shoulder surgery; Joint replacement; and Tubal ligation.   Her family history includes Alzheimer's disease in her mother; Cancer in her brother; Hypertension in her father; Stroke in her father.She reports that she quit smoking about 8 months ago. Her smoking use included Cigarettes. She has a 20 pack-year smoking history. She does not have any smokeless tobacco history on file. She reports that she drinks alcohol. She reports that she does not use illicit drugs.  Current Outpatient Prescriptions on File Prior to Visit  Medication Sig Dispense Refill  . albuterol (PROVENTIL HFA;VENTOLIN HFA) 108 (90 BASE) MCG/ACT inhaler Inhale 2 puffs into the lungs every 6 (six) hours as needed for wheezing or shortness of breath. 1 Inhaler 0  . Biotin 1 MG CAPS Take by mouth daily.    . budesonide-formoterol (SYMBICORT) 80-4.5 MCG/ACT inhaler Inhale 2 puffs into the lungs 2 (two) times daily. (Patient taking differently: Inhale 2 puffs into the lungs 2 (two) times daily as needed. Taking only as needed) 1 Inhaler 3  . Calcium-Vitamin D-Vitamin K (CALCIUM + D + K PO) Take by mouth 2 (two) times daily before a meal.    . cholecalciferol (VITAMIN D) 1000 UNITS tablet Take 1,000 Units by mouth daily.    . fish oil-omega-3 fatty acids 1000 MG capsule Take 1 g by mouth 2 (two) times daily before a meal.    . Garlic 7412 MG CAPS Take by mouth daily.    Marland Kitchen losartan-hydrochlorothiazide  (HYZAAR) 100-25 MG per tablet Take 1 tablet by mouth daily. 90 tablet 1  . Lysine 500 MG TABS Take by mouth 2 (two) times daily before a meal.    . Melatonin 5 MG TABS Take 1 tablet by mouth daily.    . raloxifene (EVISTA) 60 MG tablet Take 1 tablet (60 mg total) by mouth daily. 90 tablet 1  . ranitidine (ZANTAC) 150 MG tablet Take 1 tablet (150 mg total) by mouth 2 (two) times daily. 180 tablet 1  . RESTASIS 0.05 % ophthalmic emulsion Place 1 drop into both eyes 2 (two) times daily as needed.     . rosuvastatin (CRESTOR) 20 MG tablet Take 1 tablet (20 mg total) by mouth daily. 90 tablet 1  . traZODone (DESYREL) 50 MG tablet Take 1 tablet (50 mg total) by mouth at bedtime. 90 tablet 1  . TURMERIC PO Take by mouth.    . triamcinolone (NASACORT AQ) 55 MCG/ACT AERO nasal inhaler Place 2 sprays into the nose daily. (Patient not taking: Reported on 06/16/2015) 1 Inhaler 12   Current Facility-Administered Medications on File Prior to Visit  Medication Dose Route Frequency Provider Last Rate Last Dose  . levalbuterol (XOPENEX) nebulizer solution 1.25 mg  1.25 mg Nebulization Once Lysbeth Penner, FNP   1.25 mg at 04/17/14 1045    ROS Review of Systems  Constitutional: Positive for fever. Negative for chills, activity change and appetite change.  HENT: Positive for congestion, postnasal drip, rhinorrhea and sinus pressure. Negative for  ear discharge, ear pain, hearing loss, nosebleeds, sneezing and trouble swallowing.   Respiratory: Negative for chest tightness and shortness of breath.   Cardiovascular: Negative for chest pain and palpitations.  Skin: Negative for rash.    Objective:  BP 173/96 mmHg  Pulse 76  Temp(Src) 97.4 F (36.3 C) (Oral)  Ht '5\' 1"'$  (1.549 m)  Wt 196 lb (88.905 kg)  BMI 37.05 kg/m2  Physical Exam  Constitutional: She appears well-developed and well-nourished.  HENT:  Head: Normocephalic and atraumatic.  Right Ear: Tympanic membrane and external ear normal. No  decreased hearing is noted.  Left Ear: Tympanic membrane and external ear normal. No decreased hearing is noted.  Nose: Mucosal edema present. Right sinus exhibits no frontal sinus tenderness. Left sinus exhibits no frontal sinus tenderness.  Mouth/Throat: No oropharyngeal exudate or posterior oropharyngeal erythema.  Neck: No Brudzinski's sign noted.  Pulmonary/Chest: Breath sounds normal. No respiratory distress.  Lymphadenopathy:       Head (right side): No preauricular adenopathy present.       Head (left side): No preauricular adenopathy present.       Right cervical: No superficial cervical adenopathy present.      Left cervical: No superficial cervical adenopathy present.    Assessment & Plan:   Renee Maldonado was seen today for insect bite, cough, nasal congestion, sore throat and watery eyes.  Diagnoses and all orders for this visit:  Mycoplasmal pharyngitis  Sore throat Orders: -     POCT rapid strep A -     betamethasone acetate-betamethasone sodium phosphate (CELESTONE) injection 6 mg; Inject 1 mL (6 mg total) into the muscle once.  Insect bite  Other orders -     levofloxacin (LEVAQUIN) 500 MG tablet; Take 1 tablet (500 mg total) by mouth daily. -     benzonatate (TESSALON) 100 MG capsule; Take 1 capsule (100 mg total) by mouth 3 (three) times daily as needed for cough.   I have discontinued Renee Maldonado's benzonatate. I have also changed her benzonatate. Additionally, I am having her start on levofloxacin. Lastly, I am having her maintain her cholecalciferol, fish oil-omega-3 fatty acids, Calcium-Vitamin D-Vitamin K (CALCIUM + D + K PO), Biotin, Lysine, Garlic, albuterol, RESTASIS, TURMERIC PO, budesonide-formoterol, losartan-hydrochlorothiazide, ranitidine, rosuvastatin, traZODone, raloxifene, Melatonin, and triamcinolone. We administered betamethasone acetate-betamethasone sodium phosphate. We will continue to administer levalbuterol.  Meds ordered this encounter  Medications   . levofloxacin (LEVAQUIN) 500 MG tablet    Sig: Take 1 tablet (500 mg total) by mouth daily.    Dispense:  7 tablet    Refill:  0  . betamethasone acetate-betamethasone sodium phosphate (CELESTONE) injection 6 mg    Sig:   . benzonatate (TESSALON) 100 MG capsule    Sig: Take 1 capsule (100 mg total) by mouth 3 (three) times daily as needed for cough.    Dispense:  30 capsule    Refill:  0     Follow-up: Return if symptoms worsen or fail to improve.  Claretta Fraise, M.D.

## 2015-07-16 ENCOUNTER — Other Ambulatory Visit: Payer: Self-pay | Admitting: Nurse Practitioner

## 2015-07-16 NOTE — Telephone Encounter (Signed)
Last filled 04/17/15, last seen 04/03/15

## 2015-08-03 ENCOUNTER — Telehealth: Payer: Self-pay | Admitting: Family Medicine

## 2015-08-13 NOTE — Telephone Encounter (Signed)
Unable to reach patient.

## 2015-08-30 ENCOUNTER — Ambulatory Visit (INDEPENDENT_AMBULATORY_CARE_PROVIDER_SITE_OTHER): Payer: Medicare Other

## 2015-08-30 ENCOUNTER — Encounter: Payer: Self-pay | Admitting: Nurse Practitioner

## 2015-08-30 ENCOUNTER — Ambulatory Visit (INDEPENDENT_AMBULATORY_CARE_PROVIDER_SITE_OTHER): Payer: Medicare Other | Admitting: Nurse Practitioner

## 2015-08-30 DIAGNOSIS — M899 Disorder of bone, unspecified: Secondary | ICD-10-CM

## 2015-08-30 DIAGNOSIS — G4700 Insomnia, unspecified: Secondary | ICD-10-CM

## 2015-08-30 DIAGNOSIS — Z23 Encounter for immunization: Secondary | ICD-10-CM | POA: Diagnosis not present

## 2015-08-30 DIAGNOSIS — E785 Hyperlipidemia, unspecified: Secondary | ICD-10-CM

## 2015-08-30 DIAGNOSIS — J449 Chronic obstructive pulmonary disease, unspecified: Secondary | ICD-10-CM | POA: Diagnosis not present

## 2015-08-30 DIAGNOSIS — M8580 Other specified disorders of bone density and structure, unspecified site: Secondary | ICD-10-CM

## 2015-08-30 DIAGNOSIS — IMO0001 Reserved for inherently not codable concepts without codable children: Secondary | ICD-10-CM

## 2015-08-30 DIAGNOSIS — K219 Gastro-esophageal reflux disease without esophagitis: Secondary | ICD-10-CM | POA: Diagnosis not present

## 2015-08-30 DIAGNOSIS — I10 Essential (primary) hypertension: Secondary | ICD-10-CM

## 2015-08-30 DIAGNOSIS — K5730 Diverticulosis of large intestine without perforation or abscess without bleeding: Secondary | ICD-10-CM

## 2015-08-30 DIAGNOSIS — F411 Generalized anxiety disorder: Secondary | ICD-10-CM | POA: Diagnosis not present

## 2015-08-30 MED ORDER — ROSUVASTATIN CALCIUM 20 MG PO TABS: 20.0000 mg | ORAL_TABLET | Freq: Every day | ORAL | Status: DC

## 2015-08-30 MED ORDER — LOSARTAN POTASSIUM 100 MG PO TABS: 100.0000 mg | ORAL_TABLET | Freq: Every day | ORAL | Status: DC

## 2015-08-30 MED ORDER — HYDROCHLOROTHIAZIDE 25 MG PO TABS: 25.0000 mg | ORAL_TABLET | Freq: Every day | ORAL | Status: DC

## 2015-08-30 MED ORDER — RALOXIFENE HCL 60 MG PO TABS: 60.0000 mg | ORAL_TABLET | Freq: Every day | ORAL | Status: DC

## 2015-08-30 MED ORDER — LOSARTAN POTASSIUM-HCTZ 100-25 MG PO TABS: 1.0000 | ORAL_TABLET | Freq: Every day | ORAL | Status: DC

## 2015-08-30 MED ORDER — TRAZODONE HCL 50 MG PO TABS: ORAL_TABLET | ORAL | Status: DC

## 2015-08-30 MED ORDER — RANITIDINE HCL 150 MG PO TABS: 150.0000 mg | ORAL_TABLET | Freq: Two times a day (BID) | ORAL | Status: DC

## 2015-08-30 NOTE — Progress Notes (Signed)
Subjective:    Patient ID: Renee Maldonado, female    DOB: 23-Feb-1946, 69 y.o.   MRN: [id number]  Patient here today for follow up . Doing well today without complaints- She has not been taking her meds as rx- 6 months worth of refills have lasted her 10 months.  Hypertension This is a chronic problem. The current episode started more than 1 year ago. The problem is unchanged. The problem is controlled. Pertinent negatives include no chest pain, headaches, neck pain, palpitations or shortness of breath. Risk factors for coronary artery disease include dyslipidemia, obesity, post-menopausal state and family history. Past treatments include angiotensin blockers and diuretics.  Hyperlipidemia This is a chronic problem. The current episode started more than 1 year ago. The problem is controlled. Recent lipid tests were reviewed and are normal. Exacerbating diseases include obesity. She has no history of diabetes or hypothyroidism. Factors aggravating her hyperlipidemia include thiazides. Pertinent negatives include no chest pain or shortness of breath. Current antihyperlipidemic treatment includes statins. The current treatment provides no improvement of lipids. Compliance problems include adherence to diet and adherence to exercise.  Risk factors for coronary artery disease include dyslipidemia, hypertension, post-menopausal and obesity.  GERD Currently trying diet control with occasional zantac Ostenpenia Not on any meds- is trying weight bearing exercsie but doesn't do daily.Last dexa scan was 11/20/10. Insomnia Trazadone nightly- rest well Diverticulosis Patient tries to watch diet- no recent flare ups. COPD Patient has stopped using symbicort and has not needed her albuterol in over 3 months. GAD Currently not taking anything- trying to manage her stress   Review of Systems  Constitutional: Negative.   HENT: Negative.   Respiratory: Negative for shortness of breath.   Cardiovascular: Negative  for chest pain and palpitations.  Genitourinary: Negative.   Musculoskeletal: Negative for neck pain.  Neurological: Negative for headaches.  Psychiatric/Behavioral: Negative.   All other systems reviewed and are negative.      Objective:   Physical Exam  Constitutional: She is oriented to person, place, and time. She appears well-developed and well-nourished.  HENT:  Nose: Nose normal.  Mouth/Throat: Oropharynx is clear and moist.  Eyes: EOM are normal.  Neck: Trachea normal, normal range of motion and full passive range of motion without pain. Neck supple. No JVD present. Carotid bruit is not present. No thyromegaly present.  Cardiovascular: Normal rate, regular rhythm, normal heart sounds and intact distal pulses.  Exam reveals no gallop and no friction rub.   No murmur heard. Pulmonary/Chest: Effort normal and breath sounds normal.  Abdominal: Soft. Bowel sounds are normal. She exhibits no distension and no mass. There is no tenderness.  Musculoskeletal: Normal range of motion.  Lymphadenopathy:    She has no cervical adenopathy.  Neurological: She is alert and oriented to person, place, and time. She has normal reflexes.  Skin: Skin is warm and dry.  Psychiatric: She has a normal mood and affect. Her behavior is normal. Judgment and thought content normal.    BP 134/72 mmHg  Temp(Src) 97.5 F (36.4 C) (Oral)  Ht '5\' 1"'  (1.549 m)  Wt 199 lb (90.266 kg)  BMI 37.62 kg/m2     Assessment & Plan:   1. Morbid obesity, unspecified obesity type ([place]) Discussed diet and exercise for person with BMI >25 Will recheck weight in 3-6 months  2. Hyperlipidemia low fat diet - rosuvastatin (CRESTOR) 20 MG tablet; Take 1 tablet (20 mg total) by mouth daily.  Dispense: 90 tablet; Refill: 1 - Lipid panel  3. GAD (generalized anxiety disorder) Stress management  4. Insomnia Bedtime ritual - traZODone (DESYREL) 50 MG tablet; Take 1 tablet (50 mg total) by mouth at bedtime.   Dispense: 90 tablet; Refill: 1  5. Diverticulosis of colon without hemorrhage Watch diet- with small seeds, popcorn and skin  6. Gastroesophageal reflux disease without esophagitis Avoid spicy foods Do not eat 2 hours prior to bedtime - ranitidine (ZANTAC) 150 MG tablet; Take 1 tablet (150 mg total) by mouth 2 (two) times daily.  Dispense: 180 tablet; Refill: 1  7. COPD bronchitis Avoid cigarette smoke  8. Essential hypertension Do noy add salt to diet - losartan-hydrochlorothiazide (HYZAAR) 100-25 MG tablet; Take 1 tablet by mouth daily.  Dispense: 90 tablet; Refill: 1 - CMP14+EGFR  9. Osteopenia Weight bearing exercises DEXA today - raloxifene (EVISTA) 60 MG tablet; Take 1 tablet (60 mg total) by mouth daily.  Dispense: 90 tablet; Refill: 1    Labs pending Health maintenance reviewed Diet and exercise encouraged Continue all meds Follow up  In 3 month   [place], FNP

## 2015-08-30 NOTE — Patient Instructions (Signed)
Bone Health Bones protect organs, store calcium, and anchor muscles. Good health habits, such as eating nutritious foods and exercising regularly, are important for maintaining healthy bones. They can also help to prevent a condition that causes bones to lose density and become weak and brittle (osteoporosis). WHY IS BONE MASS IMPORTANT? Bone mass refers to the amount of bone tissue that you have. The higher your bone mass, the stronger your bones. An important step toward having healthy bones throughout life is to have strong and dense bones during childhood. A young adult who has a high bone mass is more likely to have a high bone mass later in life. Bone mass at its greatest it is called peak bone mass. A large decline in bone mass occurs in older adults. In women, it occurs about the time of menopause. During this time, it is important to practice good health habits, because if more bone is lost than what is replaced, the bones will become less healthy and more likely to break (fracture). If you find that you have a low bone mass, you may be able to prevent osteoporosis or further bone loss by changing your diet and lifestyle. HOW CAN I FIND OUT IF MY BONE MASS IS LOW? Bone mass can be measured with an X-ray test that is called a bone mineral density (BMD) test. This test is recommended for all women who are age 65 or older. It may also be recommended for men who are age 70 or older, or for people who are more likely to develop osteoporosis due to:  Having bones that break easily.  Having a long-term disease that weakens bones, such as kidney disease or rheumatoid arthritis.  Having menopause earlier than normal.  Taking medicine that weakens bones, such as steroids, thyroid hormones, or hormone treatment for breast cancer or prostate cancer.  Smoking.  Drinking three or more alcoholic drinks each day. WHAT ARE THE NUTRITIONAL RECOMMENDATIONS FOR HEALTHY BONES? To have healthy bones, you need  to get enough of the right minerals and vitamins. Most nutrition experts recommend getting these nutrients from the foods that you eat. Nutritional recommendations vary from person to person. Ask your health care provider what is healthy for you. Here are some general guidelines. Calcium Recommendations Calcium is the most important (essential) mineral for bone health. Most people can get enough calcium from their diet, but supplements may be recommended for people who are at risk for osteoporosis. Good sources of calcium include:  Dairy products, such as low-fat or nonfat milk, cheese, and yogurt.  Dark green leafy vegetables, such as bok choy and broccoli.  Calcium-fortified foods, such as orange juice, cereal, bread, soy beverages, and tofu products.  Nuts, such as almonds. Follow these recommended amounts for daily calcium intake:  Children, age 1-3: 700 mg.  Children, age 4-8: 1,000 mg.  Children, age 9-13: 1,300 mg.  Teens, age 14-18: 1,300 mg.  Adults, age 19-50: 1,000 mg.  Adults, age 51-70:  Men: 1,000 mg.  Women: 1,200 mg.  Adults, age 71 or older: 1,200 mg.  Pregnant and breastfeeding females:  Teens: 1,300 mg.  Adults: 1,000 mg. Vitamin D Recommendations Vitamin D is the most essential vitamin for bone health. It helps the body to absorb calcium. Sunlight stimulates the skin to make vitamin D, so be sure to get enough sunlight. If you live in a cold climate or you do not get outside often, your health care provider may recommend that you take vitamin D supplements. Good   sources of vitamin D in your diet include:  Egg yolks.  Saltwater fish.  Milk and cereal fortified with vitamin D. Follow these recommended amounts for daily vitamin D intake:  Children and teens, age 1-18: 600 international units.  Adults, age 50 or younger: 400-800 international units.  Adults, age 51 or older: 800-1,000 international units. Other Nutrients Other nutrients for bone  health include:  Phosphorus. This mineral is found in meat, poultry, dairy foods, nuts, and legumes. The recommended daily intake for adult men and adult women is 700 mg.  Magnesium. This mineral is found in seeds, nuts, dark green vegetables, and legumes. The recommended daily intake for adult men is 400-420 mg. For adult women, it is 310-320 mg.  Vitamin K. This vitamin is found in green leafy vegetables. The recommended daily intake is 120 mg for adult men and 90 mg for adult women. WHAT TYPE OF PHYSICAL ACTIVITY IS BEST FOR BUILDING AND MAINTAINING HEALTHY BONES? Weight-bearing and strength-building activities are important for building and maintaining peak bone mass. Weight-bearing activities cause muscles and bones to work against gravity. Strength-building activities increases muscle strength that supports bones. Weight-bearing and muscle-building activities include:  Walking and hiking.  Jogging and running.  Dancing.  Gym exercises.  Lifting weights.  Tennis and racquetball.  Climbing stairs.  Aerobics. Adults should get at least 30 minutes of moderate physical activity on most days. Children should get at least 60 minutes of moderate physical activity on most days. Ask your health care provide what type of exercise is best for you. WHERE CAN I FIND MORE INFORMATION? For more information, check out the following websites:  National Osteoporosis Foundation: http://nof.org/learn/basics  National Institutes of Health: http://www.niams.nih.gov/Health_Info/Bone/Bone_Health/bone_health_for_life.asp   This information is not intended to replace advice given to you by your health care provider. Make sure you discuss any questions you have with your health care provider.   Document Released: 01/24/2004 Document Revised: 03/20/2015 Document Reviewed: 11/08/2014 Elsevier Interactive Patient Education 2016 [employer]  

## 2015-08-30 NOTE — Addendum Note (Signed)
Addended by: Rolena Infante on: 08/30/2015 05:04 PM   Modules accepted: Orders

## 2015-08-30 NOTE — Addendum Note (Signed)
Addended by: Cherre Robins on: 08/30/2015 03:19 PM   Modules accepted: Orders, Medications

## 2015-08-31 LAB — CMP14+EGFR
A/G RATIO: 2 (ref 1.1–2.5)
ALT: 19 IU/L (ref 0–32)
AST: 20 IU/L (ref 0–40)
Albumin: 4 g/dL (ref 3.6–4.8)
Alkaline Phosphatase: 66 IU/L (ref 39–117)
BUN/Creatinine Ratio: 24 (ref 11–26)
BUN: 24 mg/dL (ref 8–27)
Bilirubin Total: 0.3 mg/dL (ref 0.0–1.2)
CALCIUM: 9.8 mg/dL (ref 8.7–10.3)
CO2: 28 mmol/L (ref 18–29)
CREATININE: 1 mg/dL (ref 0.57–1.00)
Chloride: 101 mmol/L (ref 97–108)
GFR, EST AFRICAN AMERICAN: 66 mL/min/{1.73_m2} (ref >59)
GFR, EST NON AFRICAN AMERICAN: 58 mL/min/{1.73_m2} — AB (ref >59)
Globulin, Total: 2 g/dL (ref 1.5–4.5)
Glucose: 111 mg/dL — ABNORMAL HIGH (ref 65–99)
Potassium: 4.6 mmol/L (ref 3.5–5.2)
Sodium: 143 mmol/L (ref 134–144)
TOTAL PROTEIN: 6 g/dL (ref 6.0–8.5)

## 2015-08-31 LAB — LIPID PANEL
CHOL/HDL RATIO: 4 ratio (ref 0.0–4.4)
Cholesterol, Total: 168 mg/dL (ref 100–199)
HDL: 42 mg/dL (ref >39)
LDL Calculated: 97 mg/dL (ref 0–99)
TRIGLYCERIDES: 147 mg/dL (ref 0–149)
VLDL CHOLESTEROL CAL: 29 mg/dL (ref 5–40)

## 2015-09-11 ENCOUNTER — Other Ambulatory Visit: Payer: Self-pay | Admitting: Nurse Practitioner

## 2015-09-19 ENCOUNTER — Ambulatory Visit (INDEPENDENT_AMBULATORY_CARE_PROVIDER_SITE_OTHER): Payer: Medicare Other | Admitting: Pharmacist

## 2015-09-19 ENCOUNTER — Encounter: Payer: Self-pay | Admitting: Pharmacist

## 2015-09-19 DIAGNOSIS — M8580 Other specified disorders of bone density and structure, unspecified site: Secondary | ICD-10-CM

## 2015-09-19 DIAGNOSIS — R739 Hyperglycemia, unspecified: Secondary | ICD-10-CM

## 2015-09-19 DIAGNOSIS — R7309 Other abnormal glucose: Secondary | ICD-10-CM | POA: Diagnosis not present

## 2015-09-19 LAB — POCT GLYCOSYLATED HEMOGLOBIN (HGB A1C): Hemoglobin A1C: 5.6

## 2015-09-19 NOTE — Patient Instructions (Addendum)
Prediabetes Many people have heard about type 2 diabetes, but its common precursor, prediabetes, doesn't get as much attention. Prediabetes is estimated by CDC to affect 86 million Americans (this includes 51% of people 65 years and older), and an estimated 90% of people with prediabetes don't even know it. According to the CDC, 15-30% of these individuals will develop type 2 diabetes within five years. In other words, as many as 26 million people that currently have prediabetes could develop type 2 diabetes by 2020, effectively doubling the number of people with type 2 diabetes in the [place].  What is prediabetes? Prediabetes is a condition where blood sugar levels are higher than normal, but not high enough to be diagnosed as type 2 diabetes. This occurs when the body has problems in processing glucose properly, and sugar starts to build up in the bloodstream instead of fueling cells in muscles and tissues. Insulin is the hormone that tells cells to take up glucose, and in prediabetes, people typically initially develop insulin resistance (where the body's cells can't respond to insulin as well), and over time (if no actions are taken to reverse the situation) the ability to produce sufficient insulin is reduced. People with prediabetes also commonly have high blood pressure as well as abnormal blood lipids (e.g. cholesterol). These often occur prior to the rise of blood glucose levels.  What are the symptoms of prediabetes? People typically do not have symptoms of prediabetes, which is partially why up to 90% of people don't know they have it. The ADA reports that some people with prediabetes may develop symptoms of type 2 diabetes, though even many people diagnosed with type 2 diabetes show little or no symptoms initially at diagnosis.  How is prediabetes diagnosed? According to the American Diabetes Association, prediabetes can be diagnosed through one of the following tests: 1. A glycated hemoglobin  test, also known as HbA1c or simply A1c, gives an idea of the body's average blood sugar levels from the past two or three months. It is usually done with a small drop of blood from a fingerstick or as part of having blood taken in a doctor's office, hospital, or laboratory. A1c Level Diagnosis  Less than 5.7% Normal  5.7% to 6.4% Prediabetes  6.5% and higher Diabetes  2. A fasting plasma glucose (FPG) test measures a person's blood glucose level after fasting (not eating) for eight hours - this is typically done in the morning. If a test shows positive for prediabetes, a second test should be taken on a different day to confirm the diagnosis. FPG Level Diagnosis  Less than 100 mg/dl Normal  100 mg/dl to 125 mg/dl Prediabetes  126 mg/dl and higher Diabetes   Who is at risk of developing prediabetes? A well-known paper published in the Lancet in 2010 recommends screening for type 2 diabetes (which would also screen for prediabetes) every 3-5 years in all adults over the age of 109, regardless of other risk factors. Overweight and obese adults (a BMI >25 kg/m2) are also at significantly greater risk for developing prediabetes, as well as people with a family history of type 2 diabetes. According to the CDC, several other factors can have moderate influences on prediabetes risk in addition to age, weight, and family history: People with an [place] American, Hispanic/Latino, American [place] American, or [place] racial or ethnic background. The 2015 ADA Standards of Medical Care recommendations suggest Asian Americans with a BMI of 23 or above be screened for type 2 diabetes.  Women with a history of diabetes during pregnancy ("gestational diabetes") or have given birth to a baby weighing nine pounds or more. People who are physically active fewer than three times a week. The CDC offers a fast, online screening test for evaluating the risk for prediabetes. The ADA also offers a screening  test to assess type 2 diabetes risk. Of course, these tests do not themselves confirm a prediabetes diagnosis, but just if someone may be at higher risk of developing it.  Why do people develop prediabetes? Prediabetes develops through a combination of factors that are still being investigated. For sure, lifestyle factors (food, exercise, stress, sleep) play a role, but family history and genetics certainly do as well. It is easy to assume that prediabetes is the result of being overweight, but the relationship is not that simple. While obesity is one underlying cause of insulin resistance, many overweight individuals may never develop prediabetes or type 2 diabetes, and a minority of people with prediabetes have never been overweight. To make matters worse, it can be increasingly difficult to make healthy choices in today's toxic food environment that steers all of [place] to make the wrong food choices, and there are many factors that can contribute to weight gain in addition to diet.  Is a prediabetes diagnosis serious? There has been significant debate around the term 'prediabetes,' and whether it should be considered cause for alarm. On the one hand, it serves as a risk factor for type 2 diabetes and a host of other complications, including heart disease, and ultimately prediabetes implies that a degree of metabolic problems have started to occur in the body. On the other hand, it places a diagnosis on many people who may never develop type 2 diabetes. Again, according to the CDC, 15-30% of those with prediabetes will develop type 2 diabetes within five years. However, a 2012 Lancet article cites 5-10% of those with prediabetes each year will also revert back to healthy blood sugars. What's critical is not necessarily the cutoff itself, but where someone falls within the ranges listed above. The level of risk of developing type 2 diabetes is closely related to A1c or FPG at diagnosis. Those in the higher ranges  (A1c closer to 6.4%, FPG closer to 125 mg/dl) are much more likely to progress to type 2 diabetes, whereas those at lower ranges (A1c closer to 5.7%, FPG closer to 100 mg/dl) are relatively more likely to revert back to normal glucose levels or stay within the prediabetes range. Age of diagnosis and the level of insulin production still occurring at diagnosis also impact the chances of reverting to normoglycemia (normal blood sugar levels).  What can people with prediabetes do to avoid the progression from prediabetes to type 2 diabetes? The most important action people diagnosed with prediabetes can take is to focus on living a healthy lifestyle. This includes making healthy food choices, controlling portions, and increasing physical activity. Regarding weight control, research shows losing 5-7% (often about 10-20 lbs.) from your initial body weight and keeping off as much of that weight over time as possible is critical to lowering the risk of type 2 diabetes. This task is of course easier said than done, but sustained weight loss over time can be key to improving health and delaying or preventing the onset of type 2 diabetes. Several prediabetes interventions exist based on evidence from the landmark Diabetes Prevention Program (DPP) study. The DPP study reported that moderate weight loss (5-7% of body weight, or ~10-15 lbs.  for someone weighing 200 lbs.), counseling, and education on healthy eating and behavior reduced the risk of developing type 2 diabetes by 58%. Data presented at the [place] 2014 conference showed that after 15 years of follow-up of the DPP study groups, the results were still encouraging: 27% of those in the original lifestyle group had a significant reduction in type 2 diabetes progression compared to the control group. If you or someone you know has been told they have prediabetes, here are a few helpful resources: In-person diabetes prevention programs: The CDC offers a one year long  lifestyle change program through its National Diabetes Prevention Program (NDPP) at various locations throughout the [place] to help participants adopt healthy habits and prevent or delay progression to type 2 diabetes. This program is a major undertaking by the CDC to translate the findings from the DPP study into a real world setting, a significant effort indeed! Online diabetes prevention programs: The CDC has now given pending recognition status to three digital prevention programs: [place], and [facility]. These offer the same one year long educational curriculum as the DPP study, but in an online format. Some insurance companies and employers cover these programs, and you can find more information at the links above. These digital versions are excellent options for those who live far away from NDPP locations or who prefer the anonymity and convenience of doing the program online. Metformin: The DPP study found that metformin, the safest first-line therapy for type 2 diabetes, may help delay the onset of type 2 diabetes in people with prediabetes. Participants who took the low-cost generic drug had a 31% reduced risk of developing type 2 diabetes compared to the control group (those not on metformin or intensive lifestyle intervention). Again, 15-year follow up data showed that 17% of those on metformin continued to have a significant reduction in type 2 progression. At this time, metformin (or any other medication, for that matter) is not currently FDA approved for prediabetes, and it is sometimes prescribed "off-label" by a healthcare provider. Your healthcare provider can give you more information and determine whether metformin is a good option for you.  Can prediabetes be "cured"? In the early stages of prediabetes (and type 2 diabetes), diligent attention to food choices and activity, and most importantly weight loss, can improve blood sugar numbers, effectively "reversing" the disease  and reducing the odds of developing type 2 diabetes. However, some people may have underlying factors (such as family history and genetics) that put them at a greater risk of type 2 diabetes, meaning they will always require careful attention to blood sugar levels and lifestyle choices. Returning to old habits will likely put someone back on the road to prediabetes, and eventually, type 2 diabetes     Fall Prevention in the [place] can cause injuries and can affect people from all age groups. There are many simple things that you can do to make your home safe and to help prevent falls. WHAT CAN I DO ON THE OUTSIDE OF MY HOME?  Regularly repair the edges of walkways and driveways and fix any cracks.  Remove high doorway thresholds.  Trim any shrubbery on the main path into your home.  Use bright outdoor lighting.  Clear walkways of debris and clutter, including tools and rocks.  Regularly check that handrails are securely fastened and in good repair. Both sides of any steps should have handrails.  Install guardrails along the edges of any raised decks or porches.  Have leaves, snow, and ice cleared regularly.  Use sand or salt on walkways during winter months.  In the garage, clean up any spills right away, including grease or oil spills. WHAT CAN I DO IN THE BATHROOM?  Use night lights.  Install grab bars by the toilet and in the tub and shower. Do not use towel bars as grab bars.  Use non-skid mats or decals on the floor of the tub or shower.  If you need to sit down while you are in the shower, use a plastic, non-slip stool.[place] [employer]  Keep the floor dry. Immediately clean up any water that spills on the floor.  Remove soap buildup in the tub or shower on a regular basis.  Attach bath mats securely with double-sided non-slip rug tape.  Remove throw rugs and other tripping hazards from the floor. WHAT CAN I DO IN THE BEDROOM?  Use night lights.  Make sure that a bedside  light is easy to reach.  Do not use oversized bedding that drapes onto the floor.  Have a firm chair that has side arms to use for getting dressed.  Remove throw rugs and other tripping hazards from the floor. WHAT CAN I DO IN THE KITCHEN?   Clean up any spills right away.  Avoid walking on wet floors.  Place frequently used items in easy-to-reach places.  If you need to reach for something above you, use a sturdy step stool that has a grab bar.  Keep electrical cables out of the way.  Do not use floor polish or wax that makes floors slippery. If you have to use wax, make sure that it is non-skid floor wax.  Remove throw rugs and other tripping hazards from the floor. WHAT CAN I DO IN THE STAIRWAYS?  Do not leave any items on the stairs.  Make sure that there are handrails on both sides of the stairs. Fix handrails that are broken or loose. Make sure that handrails are as long as the stairways.  Check any carpeting to make sure that it is firmly attached to the stairs. Fix any carpet that is loose or worn.  Avoid having throw rugs at the top or bottom of stairways, or secure the rugs with carpet tape to prevent them from moving.  Make sure that you have a light switch at the top of the stairs and the bottom of the stairs. If you do not have them, have them installed. WHAT ARE SOME OTHER FALL PREVENTION TIPS?  Wear closed-toe shoes that fit well and support your feet. Wear shoes that have rubber soles or low heels.  When you use a stepladder, make sure that it is completely opened and that the sides are firmly locked. Have someone hold the ladder while you are using it. Do not climb a closed stepladder.  Add color or contrast paint or tape to grab bars and handrails in your home. Place contrasting color strips on the first and last steps.  Use mobility aids as needed, such as canes, walkers, scooters, and crutches.  Turn on lights if it is dark. Replace any light bulbs that  burn out.  Set up furniture so that there are clear paths. Keep the furniture in the same spot.  Fix any uneven floor surfaces.  Choose a carpet design that does not hide the edge of steps of a stairway.  Be aware of any and all pets.  Review your medicines with your healthcare provider. Some medicines can cause dizziness or  changes in blood pressure, which increase your risk of falling. Talk with your health care provider about other ways that you can decrease your risk of falls. This may include working with a physical therapist or trainer to improve your strength, balance, and endurance.   This information is not intended to replace advice given to you by your health care provider. Make sure you discuss any questions you have with your health care provider.   Document Released: 10/24/2002 Document Revised: 03/20/2015 Document Reviewed: 12/08/2014 Elsevier Interactive Patient Education 2016 [employer].               Exercise for [employer]  Exercise is important to build and maintain strong bones / bone density.  There are 2 types of exercises that are important to building and maintaining strong bones:  Weight- bearing and muscle-stregthening.  Weight-bearing Exercises  These exercises include activities that make you move against gravity while staying upright. Weight-bearing exercises can be high-impact or low-impact.  High-impact weight-bearing exercises help build bones and keep them strong. If you have broken a bone due to osteoporosis or are at risk of breaking a bone, you may need to avoid high-impact exercises. If you're not sure, you should check with your healthcare provider.  Examples of high-impact weight-bearing exercises are: Dancing  Doing high-impact aerobics  Hiking  Jogging/running  Jumping Rope  Stair climbing  Tennis  Low-impact weight-bearing exercises can also help keep bones strong and are a safe alternative if you cannot do high-impact exercises.    Examples of low-impact weight-bearing exercises are: Using elliptical training machines  Doing low-impact aerobics  Using stair-step machines  Fast walking on a treadmill or outside   Muscle-Strengthening Exercises These exercises include activities where you move your body, a weight or some other resistance against gravity. They are also known as resistance exercises and include: Lifting weights  Using elastic exercise bands  Using weight machines  Lifting your own body weight  Functional movements, such as standing and rising up on your toes  Yoga and Pilates can also improve strength, balance and flexibility. However, certain positions may not be safe for people with osteoporosis or those at increased risk of broken bones. For example, exercises that have you bend forward may increase the chance of breaking a bone in the spine.   Non-Impact Exercises There are other types of exercises that can help prevent falls.  Non-impact exercises can help you to improve balance, posture and how well you move in everyday activities. Some of these exercises include: Balance exercises that strengthen your legs and test your balance, such as Tai Chi, can decrease your risk of falls.  Posture exercises that improve your posture and reduce rounded or "sloping" shoulders can help you decrease the chance of breaking a bone, especially in the spine.  Functional exercises that improve how well you move can help you with everyday activities and decrease your chance of falling and breaking a bone. For example, if you have trouble getting up from a chair or climbing stairs, you should do these activities as exercises.   **A physical therapist can teach you balance, posture and functional exercises. He/she can also help you learn which exercises are safe and appropriate for you.   has a physical therapy office in [place] in front of our office and referrals can be made for assessments and treatment  as needed and strength and balance training.  If you would like to have an assessment with [place] and our  physical therapy team please let a nurse or provider know.

## 2015-09-19 NOTE — Progress Notes (Signed)
Patient ID: Renee Maldonado, female   DOB: [birth year], 69 y.o.   MRN: [id number]  [facility] Current Height:        Max Lifetime Height:  '5\' 4"'$  Current Weight:         Ethnicity:Caucasian  BP:       HR:         HPI: Does pt already have a diagnosis of:  Osteopenia?  Yes Osteoporosis?  No  Back Pain?  Yes  Bur rarely - mostly pain is in hips and knees     Kyphosis?  No Prior fracture?  Yes - humerus around 2005;  Med(s) for Osteoporosis/Osteopenia:  Evista '60mg'$  daily and calcium / MVI Med(s) previously tried for Osteoporosis/Osteopenia:  none                                                             PMH: Hysterectomy?  No Oophorectomy?  No HRT? No Steroid Use?  No Thyroid med?  No History of cancer?  No History of digestive disorders (ie Crohn's)?  Yes - GERD - chronic H2agonist Current or previous eating disorders?  No Last Vitamin D Result:  34.9 (03/08/2015) Last GFR Result:  58 (08/30/2015)  FBG was elevated at 111 when checked 08/30/2015)   FH/SH: Family history of osteoporosis?  No Parent with history of hip fracture?  No Family history of breast cancer?  No Exercise?  Yes - Tia Chi once weekly at [facility] Smoking?  No Alcohol?  No    Calcium Assessment Calcium Intake  # of servings/day  Calcium mg  Milk (8 oz) 0  x  300  = 0  Yogurt (4 oz) 0 x  200 = 0  Cheese (1 oz) 0 X '200mg'$  0  Other Calcium sources   '250mg'$   Ca supplement MVI + '600mg'$  bid = '1600mg'$    Estimated calcium intake per day '1850mg'$     DEXA Results Date of Test T-Score for AP Spine L1-L4 T-Score for Total Left Hip T-Score for Total Right Hip  08/30/2015 -1.7 -0.7 -0.8  08/10/2013 -1.9 -0.8 -0.9  11/20/2010 -1.1 -0.4 -0.5  08/23/2008 -1.7 -0.2 -0.5   FRAX 10 year estimate: Total FX risk:  16%  (consider medication if >/= 20%) Hip FX risk:  2.6%  (consider medication if >/= 3%)  Assessment: Osteopenia with improved BMD since starting evista Elevated BG  Recommendations: 1.   Continue reloxifine (EVISTA) '60mg'$  daily 2.  continue calcium '1200mg'$  daily through supplementation or diet.  3.  recommend weight bearing exercise - 30 minutes at least 4 days per week.   4.  Counseled and educated about fall risk and prevention.  Recheck DEXA:  2 years  Time spent counseling patient:  30 minutes   Cherre Robins, PharmD, CPP

## 2015-10-09 ENCOUNTER — Telehealth: Payer: Self-pay | Admitting: Nurse Practitioner

## 2015-10-09 MED ORDER — AZITHROMYCIN 250 MG PO TABS: ORAL_TABLET | ORAL | Status: DC

## 2015-10-09 NOTE — Telephone Encounter (Signed)
zpak sent to pharmacy 

## 2015-10-09 NOTE — Telephone Encounter (Signed)
Patient aware rx called to pharmacy

## 2015-10-13 ENCOUNTER — Encounter: Payer: Self-pay | Admitting: Family Medicine

## 2015-10-13 ENCOUNTER — Ambulatory Visit (INDEPENDENT_AMBULATORY_CARE_PROVIDER_SITE_OTHER): Payer: Medicare Other | Admitting: Family Medicine

## 2015-10-13 VITALS — BP 124/72 | HR 98 | Temp 98.5°F | Ht 61.0 in | Wt 200.6 lb

## 2015-10-13 DIAGNOSIS — J441 Chronic obstructive pulmonary disease with (acute) exacerbation: Secondary | ICD-10-CM

## 2015-10-13 MED ORDER — METHYLPREDNISOLONE ACETATE 80 MG/ML IJ SUSP
40.0000 mg | Freq: Once | INTRAMUSCULAR | Status: AC
  Administered 2015-10-15: 40 mg via INTRAMUSCULAR

## 2015-10-13 NOTE — Progress Notes (Signed)
BP 124/72 mmHg  Pulse 98  Temp(Src) 98.5 F (36.9 C) (Oral)  Ht '5\' 1"'$  (1.549 m)  Wt 200 lb 9.6 oz (90.992 kg)  BMI 37.92 kg/m2   Subjective:    Patient ID: Renee Maldonado, female    DOB: August 26, 1946, 69 y.o.   MRN: [id number]  HPI: Renee Maldonado is a 68 y.o. female presenting on 10/13/2015 for Sinusitis   HPI Coughing spells and sinus congestion Patient has been having persistent coughing spells and sinus congestion and postnasal drainage. This is been going on for about a week. She called in and got a Z-Pak from her physician and is on a 4 of Z-Pak and feels like she hasn't had a lot of help from that. She denies fevers or chills. She denies wheezing. She does have a prescription for Symbicort and albuterol but has not been using them. Her coughing spells are worse at night. Her coughing is nonproductive. She denies any sick contacts. She was a lifelong smoker until last year when she quit. She is still not smoking. She tried over-the-counter Mucinex as well without much success.  Relevant past medical, surgical, family and social history reviewed and updated as indicated. Interim medical history since our last visit reviewed. Allergies and medications reviewed and updated.  Review of Systems  Constitutional: Negative for fever and chills.  HENT: Positive for congestion, postnasal drip, rhinorrhea, sinus pressure and sore throat. Negative for ear discharge, ear pain and sneezing.   Eyes: Negative for pain, redness and visual disturbance.  Respiratory: Positive for cough and chest tightness. Negative for shortness of breath.   Cardiovascular: Negative for chest pain and leg swelling.  Genitourinary: Negative for dysuria and difficulty urinating.  Musculoskeletal: Negative for back pain and gait problem.  Skin: Negative for rash.  Neurological: Negative for light-headedness and headaches.  Psychiatric/Behavioral: Negative for behavioral problems and agitation.  All other systems  reviewed and are negative.   Per HPI unless specifically indicated above     Medication List       This list is accurate as of: 10/13/15  9:21 AM.  Always use your most recent med list.               albuterol 108 (90 BASE) MCG/ACT inhaler  Commonly known as:  PROVENTIL HFA;VENTOLIN HFA  Inhale 2 puffs into the lungs every 6 (six) hours as needed for wheezing or shortness of breath.     aspirin 81 MG tablet  Take 81 mg by mouth daily.     azithromycin 250 MG tablet  Commonly known as:  ZITHROMAX Z-PAK  As directed     BAYER BACK & BODY PAIN EX ST 500-32.5 MG Tabs  Generic drug:  Aspirin-Caffeine  Take 1 tablet by mouth daily as needed.     benzonatate 100 MG capsule  Commonly known as:  TESSALON  Take 1 capsule (100 mg total) by mouth 3 (three) times daily as needed for cough.     Biotin 1 MG Caps  Take by mouth daily.     budesonide-formoterol 80-4.5 MCG/ACT inhaler  Commonly known as:  SYMBICORT  Inhale 2 puffs into the lungs 2 (two) times daily.     CALCIUM + D + K PO  Take 1 tablet by mouth 2 (two) times daily before a meal.     CHLOR-TRIMETON 4 MG tablet  Generic drug:  chlorpheniramine  Take 4 mg by mouth at bedtime as needed for allergies.     cholecalciferol 1000 UNITS  tablet  Commonly known as:  VITAMIN D  Take 1,000 Units by mouth daily.     fish oil-omega-3 fatty acids 1000 MG capsule  Take 1 g by mouth 2 (two) times daily before a meal.     Garlic 1856 MG Caps  Take by mouth daily.     hydrochlorothiazide 25 MG tablet  Commonly known as:  HYDRODIURIL  Take 1 tablet (25 mg total) by mouth daily.     losartan 100 MG tablet  Commonly known as:  COZAAR  Take 1 tablet (100 mg total) by mouth daily.     Lysine 500 MG Tabs  Take by mouth 2 (two) times daily before a meal.     Melatonin 5 MG Tabs  Take 1 tablet by mouth daily.     raloxifene 60 MG tablet  Commonly known as:  EVISTA  Take 1 tablet (60 mg total) by mouth daily.      ranitidine 150 MG tablet  Commonly known as:  ZANTAC  Take 1 tablet (150 mg total) by mouth 2 (two) times daily.     RESTASIS 0.05 % ophthalmic emulsion  Generic drug:  cycloSPORINE  Place 1 drop into both eyes 2 (two) times daily as needed.     rosuvastatin 20 MG tablet  Commonly known as:  CRESTOR  Take 1 tablet (20 mg total) by mouth daily.     SM MULTIPLE VITAMINS WOMENS PO  Take 1 tablet by mouth daily.     traZODone 50 MG tablet  Commonly known as:  DESYREL  Take 1 tablet (50 mg total) by mouth at bedtime.     triamcinolone 55 MCG/ACT Aero nasal inhaler  Commonly known as:  NASACORT AQ  Place 2 sprays into the nose daily.     TURMERIC PO  Take by mouth.     ZYRTEC ALLERGY PO  Take 1 tablet by mouth daily.           Objective:    BP 124/72 mmHg  Pulse 98  Temp(Src) 98.5 F (36.9 C) (Oral)  Ht '5\' 1"'$  (1.549 m)  Wt 200 lb 9.6 oz (90.992 kg)  BMI 37.92 kg/m2  Wt Readings from Last 3 Encounters:  10/13/15 200 lb 9.6 oz (90.992 kg)  08/30/15 199 lb (90.266 kg)  06/16/15 196 lb (88.905 kg)    Physical Exam  Constitutional: She is oriented to person, place, and time. She appears well-developed and well-nourished. No distress.  HENT:  Right Ear: Tympanic membrane, external ear and ear canal normal.  Left Ear: Tympanic membrane, external ear and ear canal normal.  Nose: Mucosal edema and rhinorrhea present. No epistaxis. Right sinus exhibits maxillary sinus tenderness. Right sinus exhibits no frontal sinus tenderness. Left sinus exhibits maxillary sinus tenderness. Left sinus exhibits no frontal sinus tenderness.  Mouth/Throat: Uvula is midline and mucous membranes are normal. Posterior oropharyngeal edema and posterior oropharyngeal erythema present. No oropharyngeal exudate or tonsillar abscesses.  Eyes: Conjunctivae and EOM are normal.  Cardiovascular: Normal rate, regular rhythm, normal heart sounds and intact distal pulses.   No murmur heard. Pulmonary/Chest:  Effort normal and breath sounds normal. No respiratory distress. She has no wheezes. She has no rales.  Musculoskeletal: Normal range of motion. She exhibits no edema or tenderness.  Neurological: She is alert and oriented to person, place, and time. Coordination normal.  Skin: Skin is warm and dry. No rash noted. She is not diaphoretic.  Psychiatric: She has a normal mood and affect. Her behavior is  normal.  Nursing note and vitals reviewed.   Results for orders placed or performed in visit on 09/19/15  POCT glycosylated hemoglobin (Hb A1C)  Result Value Ref Range   Hemoglobin A1C 5.6       Assessment & Plan:   Problem List Items Addressed This Visit    None    Visit Diagnoses    COPD exacerbation ([place])    -  Primary    Having persistent coughing and sinus pressure and postnasal drainage, has been on Z-Pak for 4 days. Also having coughing spells, will give Depo-Medrol    Relevant Medications    methylPREDNISolone acetate (DEPO-MEDROL) injection 40 mg (Start on 10/13/2015  9:30 AM)        Follow up plan: Return if symptoms worsen or fail to improve.  Counseling provided for all of the vaccine components No orders of the defined types were placed in this encounter.    Caryl Pina, MD [facility] 10/13/2015, 9:21 AM

## 2015-10-15 DIAGNOSIS — J441 Chronic obstructive pulmonary disease with (acute) exacerbation: Secondary | ICD-10-CM | POA: Diagnosis not present

## 2015-10-22 ENCOUNTER — Telehealth: Payer: Self-pay | Admitting: Nurse Practitioner

## 2015-10-22 NOTE — Telephone Encounter (Signed)
Should not need another antibiotic- if no better then NTBS

## 2015-10-22 NOTE — Telephone Encounter (Signed)
Pt has had a Zpak, then shot by Dr. Warrick Parisian & using inhalers. Earache, headache and ST continues. Appt made for tomorrow w/ MMM.

## 2015-10-23 ENCOUNTER — Encounter: Payer: Self-pay | Admitting: Nurse Practitioner

## 2015-10-23 ENCOUNTER — Ambulatory Visit (INDEPENDENT_AMBULATORY_CARE_PROVIDER_SITE_OTHER): Payer: Medicare Other | Admitting: Nurse Practitioner

## 2015-10-23 VITALS — BP 128/82 | HR 88 | Temp 98.3°F | Ht 61.0 in | Wt 187.4 lb

## 2015-10-23 DIAGNOSIS — H6503 Acute serous otitis media, bilateral: Secondary | ICD-10-CM

## 2015-10-23 DIAGNOSIS — J069 Acute upper respiratory infection, unspecified: Secondary | ICD-10-CM

## 2015-10-23 MED ORDER — DOXYCYCLINE HYCLATE 100 MG PO TABS: 100.0000 mg | ORAL_TABLET | Freq: Two times a day (BID) | ORAL | Status: DC

## 2015-10-23 NOTE — Progress Notes (Signed)
   Subjective:    Patient ID: Renee Maldonado, female    DOB: [birth year], 69 y.o.   MRN: [id number]  HPI Patient in today c/o cough,congestion and ears stopped up. Slight sore throat. No fever. Has been sick for about 2 weeks. Took a zpak which did not help.    Review of Systems  Constitutional: Negative.   HENT: Negative.   Respiratory: Negative.   Cardiovascular: Negative.   Genitourinary: Negative.   Neurological: Negative.   Psychiatric/Behavioral: Negative.   All other systems reviewed and are negative.      Objective:   Physical Exam  Constitutional: She is oriented to person, place, and time. She appears well-developed and well-nourished.  HENT:  Right Ear: Hearing, external ear and ear canal normal. Tympanic membrane is erythematous and bulging. A middle ear effusion is present.  Left Ear: Hearing, external ear and ear canal normal. Tympanic membrane is erythematous and bulging. A middle ear effusion is present.  Nose: Mucosal edema and rhinorrhea present. Right sinus exhibits no maxillary sinus tenderness and no frontal sinus tenderness. Left sinus exhibits no maxillary sinus tenderness and no frontal sinus tenderness.  Mouth/Throat: Uvula is midline, oropharynx is clear and moist and mucous membranes are normal.  Cardiovascular: Normal rate and normal heart sounds.   Pulmonary/Chest: Effort normal and breath sounds normal.  Neurological: She is alert and oriented to person, place, and time.  Skin: Skin is warm.  Psychiatric: She has a normal mood and affect. Her behavior is normal. Judgment and thought content normal.   BP 128/82 mmHg  Pulse 88  Temp(Src) 98.3 F (36.8 C) (Oral)  Ht '5\' 1"'$  (1.549 m)  Wt 187 lb 6.4 oz (85.004 kg)  BMI 35.43 kg/m2        Assessment & Plan:   1. Bilateral acute serous otitis media, recurrence not specified   2. Upper respiratory infection with cough and congestion    Meds ordered this encounter  Medications  . doxycycline  (VIBRA-TABS) 100 MG tablet    Sig: Take 1 tablet (100 mg total) by mouth 2 (two) times daily. 1 po bid    Dispense:  20 tablet    Refill:  0    Order Specific Question:  Supervising Provider    Answer:  Chipper Herb [[id number]]   1. Take meds as prescribed 2. Use a cool mist humidifier especially during the winter months and when heat has been humid. 3. Use saline nose sprays frequently 4. Saline irrigations of the nose can be very helpful if done frequently.  * 4X daily for 1 week*  * Use of a nettie pot can be helpful with this. Follow directions with this* 5. Drink plenty of fluids 6. Keep thermostat turn down low 7.For any cough or congestion  Use plain Mucinex- regular strength or max strength is fine   * Children- consult with Pharmacist for dosing 8. For fever or aces or pains- take tylenol or ibuprofen appropriate for age and weight.  * for fevers greater than 101 orally you may alternate ibuprofen and tylenol every  3 hours.   Continue nasacort nasal spray as rx RTO prn  Mary-Margaret Hassell Done, FNP

## 2015-10-23 NOTE — Patient Instructions (Signed)

## 2015-11-06 ENCOUNTER — Telehealth: Payer: Self-pay | Admitting: Nurse Practitioner

## 2015-11-06 NOTE — Telephone Encounter (Signed)
Pt notified of recommendation Verbalizes understanding 

## 2015-11-06 NOTE — Telephone Encounter (Signed)
Is she taking an OTC decongestant like mucinex d or robitussin?

## 2015-11-27 ENCOUNTER — Other Ambulatory Visit: Payer: Self-pay | Admitting: Family Medicine

## 2015-11-27 NOTE — Telephone Encounter (Signed)
Last seen 10/23/15  MMM

## 2015-11-30 ENCOUNTER — Encounter: Payer: Self-pay | Admitting: Nurse Practitioner

## 2015-11-30 ENCOUNTER — Ambulatory Visit (INDEPENDENT_AMBULATORY_CARE_PROVIDER_SITE_OTHER): Payer: Medicare Other

## 2015-11-30 ENCOUNTER — Ambulatory Visit (INDEPENDENT_AMBULATORY_CARE_PROVIDER_SITE_OTHER): Payer: Medicare Other | Admitting: Nurse Practitioner

## 2015-11-30 VITALS — BP 134/82 | HR 60 | Temp 98.2°F | Ht 61.0 in | Wt 198.2 lb

## 2015-11-30 DIAGNOSIS — F411 Generalized anxiety disorder: Secondary | ICD-10-CM

## 2015-11-30 DIAGNOSIS — G4700 Insomnia, unspecified: Secondary | ICD-10-CM | POA: Diagnosis not present

## 2015-11-30 DIAGNOSIS — IMO0001 Reserved for inherently not codable concepts without codable children: Secondary | ICD-10-CM

## 2015-11-30 DIAGNOSIS — I10 Essential (primary) hypertension: Secondary | ICD-10-CM

## 2015-11-30 DIAGNOSIS — E785 Hyperlipidemia, unspecified: Secondary | ICD-10-CM

## 2015-11-30 DIAGNOSIS — J449 Chronic obstructive pulmonary disease, unspecified: Secondary | ICD-10-CM

## 2015-11-30 DIAGNOSIS — Z1159 Encounter for screening for other viral diseases: Secondary | ICD-10-CM

## 2015-11-30 DIAGNOSIS — E559 Vitamin D deficiency, unspecified: Secondary | ICD-10-CM

## 2015-11-30 DIAGNOSIS — K5730 Diverticulosis of large intestine without perforation or abscess without bleeding: Secondary | ICD-10-CM | POA: Diagnosis not present

## 2015-11-30 DIAGNOSIS — K219 Gastro-esophageal reflux disease without esophagitis: Secondary | ICD-10-CM | POA: Diagnosis not present

## 2015-11-30 MED ORDER — COENZYME Q10 30 MG PO CAPS: 30.0000 mg | ORAL_CAPSULE | Freq: Three times a day (TID) | ORAL | Status: DC

## 2015-11-30 MED ORDER — BUDESONIDE-FORMOTEROL FUMARATE 80-4.5 MCG/ACT IN AERO: 2.0000 | INHALATION_SPRAY | Freq: Two times a day (BID) | RESPIRATORY_TRACT | Status: DC | PRN

## 2015-11-30 MED ORDER — RANITIDINE HCL 150 MG PO TABS: 150.0000 mg | ORAL_TABLET | Freq: Two times a day (BID) | ORAL | Status: DC

## 2015-11-30 MED ORDER — ROSUVASTATIN CALCIUM 20 MG PO TABS: 20.0000 mg | ORAL_TABLET | Freq: Every day | ORAL | Status: DC

## 2015-11-30 NOTE — Patient Instructions (Signed)
Stress and Stress Management Stress is a normal reaction to life events. It is what you feel when life demands more than you are used to or more than you can handle. Some stress can be useful. For example, the stress reaction can help you catch the last bus of the day, study for a test, or meet a deadline at work. But stress that occurs too often or for too long can cause problems. It can affect your emotional health and interfere with relationships and normal daily activities. Too much stress can weaken your immune system and increase your risk for physical illness. If you already have a medical problem, stress can make it worse. CAUSES  All sorts of life events may cause stress. An event that causes stress for one person may not be stressful for another person. Major life events commonly cause stress. These may be positive or negative. Examples include losing your job, moving into a new home, getting married, having a baby, or losing a loved one. Less obvious life events may also cause stress, especially if they occur day after day or in combination. Examples include working long hours, driving in traffic, caring for children, being in debt, or being in a difficult relationship. SIGNS AND SYMPTOMS Stress may cause emotional symptoms including, the following:  Anxiety. This is feeling worried, afraid, on edge, overwhelmed, or out of control.  Anger. This is feeling irritated or impatient.  Depression. This is feeling sad, down, helpless, or guilty.  Difficulty focusing, remembering, or making decisions. Stress may cause physical symptoms, including the following:   Aches and pains. These may affect your head, neck, back, stomach, or other areas of your body.  Tight muscles or clenched jaw.  Low energy or trouble sleeping. Stress may cause unhealthy behaviors, including the following:   Eating to feel better (overeating) or skipping meals.  Sleeping too little, too much, or both.  Working  too much or putting off tasks (procrastination).  Smoking, drinking alcohol, or using drugs to feel better. DIAGNOSIS  Stress is diagnosed through an assessment by your health care provider. Your health care provider will ask questions about your symptoms and any stressful life events.Your health care provider will also ask about your medical history and may order blood tests or other tests. Certain medical conditions and medicine can cause physical symptoms similar to stress. Mental illness can cause emotional symptoms and unhealthy behaviors similar to stress. Your health care provider may refer you to a mental health professional for further evaluation.  TREATMENT  Stress management is the recommended treatment for stress.The goals of stress management are reducing stressful life events and coping with stress in healthy ways.  Techniques for reducing stressful life events include the following:  Stress identification. Self-monitor for stress and identify what causes stress for you. These skills may help you to avoid some stressful events.  Time management. Set your priorities, keep a calendar of events, and learn to say "no." These tools can help you avoid making too many commitments. Techniques for coping with stress include the following:  Rethinking the problem. Try to think realistically about stressful events rather than ignoring them or overreacting. Try to find the positives in a stressful situation rather than focusing on the negatives.  Exercise. Physical exercise can release both physical and emotional tension. The key is to find a form of exercise you enjoy and do it regularly.  Relaxation techniques. These relax the body and mind. Examples include yoga, meditation, tai chi, biofeedback, deep  breathing, progressive muscle relaxation, listening to music, being out in nature, journaling, and other hobbies. Again, the key is to find one or more that you enjoy and can do  regularly.  Healthy lifestyle. Eat a balanced diet, get plenty of sleep, and do not smoke. Avoid using alcohol or drugs to relax.  Strong support network. Spend time with family, friends, or other people you enjoy being around.Express your feelings and talk things over with someone you trust. Counseling or talktherapy with a mental health professional may be helpful if you are having difficulty managing stress on your own. Medicine is typically not recommended for the treatment of stress.Talk to your health care provider if you think you need medicine for symptoms of stress. HOME CARE INSTRUCTIONS  Keep all follow-up visits as directed by your health care provider.  Take all medicines as directed by your health care provider. SEEK MEDICAL CARE IF:  Your symptoms get worse or you start having new symptoms.  You feel overwhelmed by your problems and can no longer manage them on your own. SEEK IMMEDIATE MEDICAL CARE IF:  You feel like hurting yourself or someone else.   This information is not intended to replace advice given to you by your health care provider. Make sure you discuss any questions you have with your health care provider.   Document Released: 04/29/2001 Document Revised: 11/24/2014 Document Reviewed: 06/28/2013 Elsevier Interactive Patient Education 2016 [employer]  

## 2015-11-30 NOTE — Progress Notes (Signed)
Subjective:    Patient ID: Renee Maldonado, female    DOB: [birth year], 70 y.o.   MRN: [id number]   Patient here today for follow up of chronic medical problems.  Outpatient Encounter Prescriptions as of 11/30/2015  Medication Sig  . albuterol (PROVENTIL HFA;VENTOLIN HFA) 108 (90 BASE) MCG/ACT inhaler Inhale 2 puffs into the lungs every 6 (six) hours as needed for wheezing or shortness of breath.  Marland Kitchen aspirin 81 MG tablet Take 81 mg by mouth daily.  . Aspirin-Caffeine (BAYER BACK & BODY PAIN EX ST) 500-32.5 MG TABS Take 1 tablet by mouth daily as needed.  . benzonatate (TESSALON) 100 MG capsule Take 1 capsule 3 (three) times daily as needed for cough.  . Biotin 1 MG CAPS Take by mouth daily.  . budesonide-formoterol (SYMBICORT) 80-4.5 MCG/ACT inhaler Inhale 2 puffs into the lungs 2 (two) times daily. (Patient taking differently: Inhale 2 puffs into the lungs 2 (two) times daily as needed. Taking only as needed)  . Calcium-Vitamin D-Vitamin K (CALCIUM + D + K PO) Take 1 tablet by mouth 2 (two) times daily before a meal.   . Cetirizine HCl (ZYRTEC ALLERGY PO) Take 1 tablet by mouth daily.  . chlorpheniramine (CHLOR-TRIMETON) 4 MG tablet Take 4 mg by mouth at bedtime as needed for allergies.  . cholecalciferol (VITAMIN D) 1000 UNITS tablet Take 1,000 Units by mouth daily.  Marland Kitchen doxycycline (VIBRA-TABS) 100 MG tablet Take 1 tablet (100 mg total) by mouth 2 (two) times daily. 1 po bid  . fish oil-omega-3 fatty acids 1000 MG capsule Take 1 g by mouth 2 (two) times daily before a meal.  . Garlic 8088 MG CAPS Take by mouth daily.  . hydrochlorothiazide (HYDRODIURIL) 25 MG tablet Take 1 tablet (25 mg total) by mouth daily.  Marland Kitchen losartan (COZAAR) 100 MG tablet Take 1 tablet (100 mg total) by mouth daily.  Marland Kitchen Lysine 500 MG TABS Take by mouth 2 (two) times daily before a meal.  . Melatonin 5 MG TABS Take 1 tablet by mouth daily.  . Multiple Vitamins-Minerals (SM MULTIPLE VITAMINS WOMENS PO) Take 1 tablet by mouth  daily.  . raloxifene (EVISTA) 60 MG tablet Take 1 tablet (60 mg total) by mouth daily.  . ranitidine (ZANTAC) 150 MG tablet Take 1 tablet (150 mg total) by mouth 2 (two) times daily.  . RESTASIS 0.05 % ophthalmic emulsion Place 1 drop into both eyes 2 (two) times daily as needed.   . rosuvastatin (CRESTOR) 20 MG tablet Take 1 tablet (20 mg total) by mouth daily.  . traZODone (DESYREL) 50 MG tablet Take 1 tablet (50 mg total) by mouth at bedtime.  . triamcinolone (NASACORT AQ) 55 MCG/ACT AERO nasal inhaler Place 2 sprays into the nose daily.  . TURMERIC PO Take by mouth.   No facility-administered encounter medications on file as of 11/30/2015.     Hypertension This is a chronic problem. The current episode started more than 1 year ago. The problem is unchanged. The problem is controlled. Pertinent negatives include no chest pain, headaches, neck pain, palpitations or shortness of breath. Risk factors for coronary artery disease include dyslipidemia, obesity, post-menopausal state and family history. Past treatments include angiotensin blockers and diuretics.  Hyperlipidemia This is a chronic problem. The current episode started more than 1 year ago. The problem is controlled. Recent lipid tests were reviewed and are normal. Exacerbating diseases include obesity. She has no history of diabetes or hypothyroidism. Factors aggravating her hyperlipidemia include thiazides. Pertinent negatives  include no chest pain or shortness of breath. Current antihyperlipidemic treatment includes statins. The current treatment provides no improvement of lipids. Compliance problems include adherence to diet and adherence to exercise.  Risk factors for coronary artery disease include dyslipidemia, hypertension, post-menopausal and obesity.  GERD Currently trying diet control with occasional zantac Ostenpenia Not on any meds- is trying weight bearing exercsie but doesn't do daily.Last dexa scan was  11/20/10. Insomnia Trazadone nightly- rest well Diverticulosis Patient tries to watch diet- no recent flare ups. COPD Patient has stopped using symbicort and has not needed her albuterol in over 3 months. GAD Currently not taking anything- trying to manage her stress.   Review of Systems  Constitutional: Negative.   HENT: Negative.   Respiratory: Negative for shortness of breath.   Cardiovascular: Negative for chest pain and palpitations.  Genitourinary: Negative.   Musculoskeletal: Negative for neck pain.  Neurological: Negative.  Negative for headaches.  Psychiatric/Behavioral: Negative.   All other systems reviewed and are negative.      Objective:   Physical Exam  Constitutional: She is oriented to person, place, and time. She appears well-developed and well-nourished.  HENT:  Nose: Nose normal.  Mouth/Throat: Oropharynx is clear and moist.  Eyes: EOM are normal.  Neck: Trachea normal, normal range of motion and full passive range of motion without pain. Neck supple. No JVD present. Carotid bruit is not present. No thyromegaly present.  Cardiovascular: Normal rate, regular rhythm, normal heart sounds and intact distal pulses.  Exam reveals no gallop and no friction rub.   No murmur heard. Pulmonary/Chest: Effort normal and breath sounds normal.  Abdominal: Soft. Bowel sounds are normal. She exhibits no distension and no mass. There is no tenderness.  Musculoskeletal: Normal range of motion.  Lymphadenopathy:    She has no cervical adenopathy.  Neurological: She is alert and oriented to person, place, and time. She has normal reflexes.  Skin: Skin is warm and dry.  Psychiatric: She has a normal mood and affect. Her behavior is normal. Judgment and thought content normal.    BP 134/82 mmHg  Pulse 60  Temp(Src) 98.2 F (36.8 C) (Oral)  Ht '5\' 1"'  (1.549 m)  Wt 198 lb 3.2 oz (89.903 kg)  BMI 37.47 kg/m2  EKG- sinus bradycardia-Mary-Margaret Hassell Done, FNP  Chest x ray-  normal-Preliminary reading by Ronnald Collum, FNP  [facility]      Assessment & Plan:  1. Essential hypertension Do not add salt to diet - EKG 12-Lead - CMP14+EGFR  2. COPD bronchitis - DG Chest 2 View; Future - budesonide-formoterol (SYMBICORT) 80-4.5 MCG/ACT inhaler; Inhale 2 puffs into the lungs 2 (two) times daily as needed.  Dispense: 1 Inhaler; Refill: 3  3. Diverticulosis of colon without hemorrhage Watch foods with seeds or skin  4. Gastroesophageal reflux disease without esophagitis Avoid spicy foods Do not eat 2 hours prior to bedtime - ranitidine (ZANTAC) 150 MG tablet; Take 1 tablet (150 mg total) by mouth 2 (two) times daily.  Dispense: 180 tablet; Refill: 1  5. Hyperlipidemia Low fat diet - rosuvastatin (CRESTOR) 20 MG tablet; Take 1 tablet (20 mg total) by mouth daily.  Dispense: 90 tablet; Refill: 1 - Lipid panel  6. GAD (generalized anxiety disorder) Stress management  7. Insomnia Bedtime ritual  8. Morbid obesity, unspecified obesity type ([place]) Discussed diet and exercise for person with BMI >25 Will recheck weight in 3-6 months   9. Vitamin D deficiency Continue vit d OTC daily  10. Need for hepatitis C  screening test - Hepatitis C antibody    Labs pending Health maintenance reviewed Diet and exercise encouraged Continue all meds Follow up  In 3 months   [place], FNP

## 2015-12-01 LAB — LIPID PANEL
CHOL/HDL RATIO: 3.6 ratio (ref 0.0–4.4)
Cholesterol, Total: 182 mg/dL (ref 100–199)
HDL: 51 mg/dL (ref >39)
LDL CALC: 99 mg/dL (ref 0–99)
TRIGLYCERIDES: 158 mg/dL — AB (ref 0–149)
VLDL Cholesterol Cal: 32 mg/dL (ref 5–40)

## 2015-12-01 LAB — HEPATITIS C ANTIBODY: Hep C Virus Ab: <0.1 s/co ratio (ref 0.0–0.9)

## 2015-12-01 LAB — CMP14+EGFR
A/G RATIO: 2 (ref 1.1–2.5)
ALBUMIN: 4.4 g/dL (ref 3.5–4.8)
ALK PHOS: 75 IU/L (ref 39–117)
ALT: 23 IU/L (ref 0–32)
AST: 19 IU/L (ref 0–40)
BUN / CREAT RATIO: 19 (ref 11–26)
BUN: 21 mg/dL (ref 8–27)
Bilirubin Total: 0.3 mg/dL (ref 0.0–1.2)
CALCIUM: 9.9 mg/dL (ref 8.7–10.3)
CO2: 26 mmol/L (ref 18–29)
Chloride: 99 mmol/L (ref 96–106)
Creatinine, Ser: 1.11 mg/dL — ABNORMAL HIGH (ref 0.57–1.00)
GFR calc Af Amer: 58 mL/min/{1.73_m2} — ABNORMAL LOW (ref >59)
GFR, EST NON AFRICAN AMERICAN: 50 mL/min/{1.73_m2} — AB (ref >59)
GLOBULIN, TOTAL: 2.2 g/dL (ref 1.5–4.5)
Glucose: 104 mg/dL — ABNORMAL HIGH (ref 65–99)
POTASSIUM: 4.9 mmol/L (ref 3.5–5.2)
SODIUM: 142 mmol/L (ref 134–144)
Total Protein: 6.6 g/dL (ref 6.0–8.5)

## 2015-12-03 ENCOUNTER — Encounter: Payer: Self-pay | Admitting: *Deleted

## 2016-01-21 LAB — HM MAMMOGRAPHY: HM MAMMO: NEGATIVE

## 2016-02-04 ENCOUNTER — Encounter: Payer: Self-pay | Admitting: *Deleted

## 2016-03-10 ENCOUNTER — Ambulatory Visit: Payer: Medicare Other | Admitting: Nurse Practitioner

## 2016-04-01 ENCOUNTER — Ambulatory Visit: Payer: Medicare Other | Admitting: Nurse Practitioner

## 2016-04-18 ENCOUNTER — Ambulatory Visit (INDEPENDENT_AMBULATORY_CARE_PROVIDER_SITE_OTHER): Payer: Medicare Other | Admitting: Nurse Practitioner

## 2016-04-18 ENCOUNTER — Encounter: Payer: Self-pay | Admitting: Nurse Practitioner

## 2016-04-18 VITALS — BP 126/72 | HR 68 | Temp 97.5°F | Ht 61.0 in | Wt 199.0 lb

## 2016-04-18 DIAGNOSIS — E785 Hyperlipidemia, unspecified: Secondary | ICD-10-CM

## 2016-04-18 DIAGNOSIS — G4700 Insomnia, unspecified: Secondary | ICD-10-CM

## 2016-04-18 DIAGNOSIS — F411 Generalized anxiety disorder: Secondary | ICD-10-CM | POA: Diagnosis not present

## 2016-04-18 DIAGNOSIS — K219 Gastro-esophageal reflux disease without esophagitis: Secondary | ICD-10-CM

## 2016-04-18 DIAGNOSIS — E559 Vitamin D deficiency, unspecified: Secondary | ICD-10-CM | POA: Diagnosis not present

## 2016-04-18 DIAGNOSIS — I10 Essential (primary) hypertension: Secondary | ICD-10-CM | POA: Diagnosis not present

## 2016-04-18 DIAGNOSIS — K5730 Diverticulosis of large intestine without perforation or abscess without bleeding: Secondary | ICD-10-CM | POA: Diagnosis not present

## 2016-04-18 DIAGNOSIS — J449 Chronic obstructive pulmonary disease, unspecified: Secondary | ICD-10-CM

## 2016-04-18 DIAGNOSIS — Z1212 Encounter for screening for malignant neoplasm of rectum: Secondary | ICD-10-CM | POA: Diagnosis not present

## 2016-04-18 DIAGNOSIS — IMO0001 Reserved for inherently not codable concepts without codable children: Secondary | ICD-10-CM

## 2016-04-18 DIAGNOSIS — M8580 Other specified disorders of bone density and structure, unspecified site: Secondary | ICD-10-CM

## 2016-04-18 MED ORDER — RANITIDINE HCL 150 MG PO TABS: 150.0000 mg | ORAL_TABLET | Freq: Two times a day (BID) | ORAL | Status: DC

## 2016-04-18 MED ORDER — LOSARTAN POTASSIUM 100 MG PO TABS: 100.0000 mg | ORAL_TABLET | Freq: Every day | ORAL | Status: DC

## 2016-04-18 MED ORDER — RALOXIFENE HCL 60 MG PO TABS: 60.0000 mg | ORAL_TABLET | Freq: Every day | ORAL | Status: DC

## 2016-04-18 MED ORDER — BUDESONIDE-FORMOTEROL FUMARATE 80-4.5 MCG/ACT IN AERO: 2.0000 | INHALATION_SPRAY | Freq: Two times a day (BID) | RESPIRATORY_TRACT | Status: DC | PRN

## 2016-04-18 MED ORDER — HYDROCHLOROTHIAZIDE 25 MG PO TABS: 25.0000 mg | ORAL_TABLET | Freq: Every day | ORAL | Status: DC

## 2016-04-18 MED ORDER — ROSUVASTATIN CALCIUM 20 MG PO TABS: 20.0000 mg | ORAL_TABLET | Freq: Every day | ORAL | Status: DC

## 2016-04-18 MED ORDER — TRAZODONE HCL 50 MG PO TABS: ORAL_TABLET | ORAL | Status: DC

## 2016-04-18 NOTE — Progress Notes (Signed)
Subjective:    Patient ID: Renee Maldonado, female    DOB: Mar 01, 1946, 70 y.o.   MRN: [id number]   Patient here today for follow up of chronic medical problems.  Outpatient Encounter Prescriptions as of 04/18/2016  Medication Sig  . albuterol (PROVENTIL HFA;VENTOLIN HFA) 108 (90 BASE) MCG/ACT inhaler Inhale 2 puffs into the lungs every 6 (six) hours as needed for wheezing or shortness of breath.  Marland Kitchen aspirin 81 MG tablet Take 81 mg by mouth daily.  . Aspirin-Caffeine (BAYER BACK & BODY PAIN EX ST) 500-32.5 MG TABS Take 1 tablet by mouth daily as needed.  . budesonide-formoterol (SYMBICORT) 80-4.5 MCG/ACT inhaler Inhale 2 puffs into the lungs 2 (two) times daily as needed.  . Calcium-Vitamin D-Vitamin K (CALCIUM + D + K PO) Take 1 tablet by mouth 2 (two) times daily before a meal.   . chlorpheniramine (CHLOR-TRIMETON) 4 MG tablet Take 4 mg by mouth at bedtime as needed for allergies.  . cholecalciferol (VITAMIN D) 1000 UNITS tablet Take 1,000 Units by mouth daily.  . fish oil-omega-3 fatty acids 1000 MG capsule Take 1 g by mouth 2 (two) times daily before a meal.  . fluticasone (FLONASE) 50 MCG/ACT nasal spray Place into both nostrils daily.  . hydrochlorothiazide (HYDRODIURIL) 25 MG tablet Take 1 tablet (25 mg total) by mouth daily.  Marland Kitchen losartan (COZAAR) 100 MG tablet Take 1 tablet (100 mg total) by mouth daily.  Marland Kitchen Lysine 500 MG TABS Take by mouth 2 (two) times daily before a meal.  . Melatonin 5 MG TABS Take 1 tablet by mouth daily.  . Multiple Vitamins-Minerals (SM MULTIPLE VITAMINS WOMENS PO) Take 1 tablet by mouth daily.  . raloxifene (EVISTA) 60 MG tablet Take 1 tablet (60 mg total) by mouth daily.  . ranitidine (ZANTAC) 150 MG tablet Take 1 tablet (150 mg total) by mouth 2 (two) times daily.  . RESTASIS 0.05 % ophthalmic emulsion Place 1 drop into both eyes 2 (two) times daily as needed.   . rosuvastatin (CRESTOR) 20 MG tablet Take 1 tablet (20 mg total) by mouth daily.  . traZODone  (DESYREL) 50 MG tablet Take 1 tablet (50 mg total) by mouth at bedtime.     Hypertension This is a chronic problem. The current episode started more than 1 year ago. The problem is unchanged. The problem is controlled. Pertinent negatives include no chest pain, headaches, neck pain, palpitations or shortness of breath. Risk factors for coronary artery disease include dyslipidemia, obesity, post-menopausal state and family history. Past treatments include angiotensin blockers and diuretics.  Hyperlipidemia This is a chronic problem. The current episode started more than 1 year ago. The problem is controlled. Recent lipid tests were reviewed and are normal. Exacerbating diseases include obesity. She has no history of diabetes or hypothyroidism. Factors aggravating her hyperlipidemia include thiazides. Pertinent negatives include no chest pain or shortness of breath. Current antihyperlipidemic treatment includes statins (takes statin QOD). The current treatment provides no improvement of lipids. Compliance problems include adherence to diet and adherence to exercise.  Risk factors for coronary artery disease include dyslipidemia, hypertension, post-menopausal and obesity.  GERD Currently trying diet control with occasional zantac Ostenpenia Not on any meds- is trying weight bearing exercsie but doesn't do daily.Last dexa scan was 11/20/10. Insomnia Trazadone nightly- rest well Diverticulosis Patient tries to watch diet- no recent flare ups. COPD Patient has stopped using symbicort and has not needed her albuterol in over 3 months. GAD Currently not taking anything- trying to  manage her stress. Vitamin d def Patient is suppose to be on OTC meds- takes in a multivitamin but does not always take every day. Hx of heme positive stool Last colonoscopy was in 2012. Will repeat test today. She saw GI and she was taking to much motrin for knee pain at that time so they just told her to stop  taking.   Review of Systems  Constitutional: Negative.   HENT: Negative.   Respiratory: Negative for shortness of breath.   Cardiovascular: Negative for chest pain and palpitations.  Genitourinary: Negative.   Musculoskeletal: Negative for neck pain.  Neurological: Negative.  Negative for headaches.  Psychiatric/Behavioral: Negative.   All other systems reviewed and are negative.      Objective:   Physical Exam  Constitutional: She is oriented to person, place, and time. She appears well-developed and well-nourished.  HENT:  Nose: Nose normal.  Mouth/Throat: Oropharynx is clear and moist.  Eyes: EOM are normal.  Neck: Trachea normal, normal range of motion and full passive range of motion without pain. Neck supple. No JVD present. Carotid bruit is not present. No thyromegaly present.  Cardiovascular: Normal rate, regular rhythm, normal heart sounds and intact distal pulses.  Exam reveals no gallop and no friction rub.   No murmur heard. Pulmonary/Chest: Effort normal and breath sounds normal.  Abdominal: Soft. Bowel sounds are normal. She exhibits no distension and no mass. There is no tenderness.  Musculoskeletal: Normal range of motion.  Lymphadenopathy:    She has no cervical adenopathy.  Neurological: She is alert and oriented to person, place, and time. She has normal reflexes.  Skin: Skin is warm and dry.  Psychiatric: She has a normal mood and affect. Her behavior is normal. Judgment and thought content normal.   BP 126/72 mmHg  Pulse 68  Temp(Src) 97.5 F (36.4 C) (Oral)  Ht _0  (1.549 m)  Wt 199 lb (90.266 kg)  BMI 37.62 kg/m2       Assessment & Plan:  1. Essential hypertension Do not add salt to diet - losartan (COZAAR) 100 MG tablet; Take 1 tablet (100 mg total) by mouth daily.  Dispense: 90 tablet; Refill: 1 - hydrochlorothiazide (HYDRODIURIL) 25 MG tablet; Take 1 tablet (25 mg total) by mouth daily.  Dispense: 90 tablet; Refill: 1 - CMP14+EGFR  2.  COPD bronchitis - budesonide-formoterol (SYMBICORT) 80-4.5 MCG/ACT inhaler; Inhale 2 puffs into the lungs 2 (two) times daily as needed.  Dispense: 1 Inhaler; Refill: 3  3. Diverticulosis of colon without hemorrhage  4. Gastroesophageal reflux disease without esophagitis Avoid spicy foods Do not eat 2 hours prior to bedtime - ranitidine (ZANTAC) 150 MG tablet; Take 1 tablet (150 mg total) by mouth 2 (two) times daily.  Dispense: 180 tablet; Refill: 1  5. Hyperlipidemia Low fat diet - rosuvastatin (CRESTOR) 20 MG tablet; Take 1 tablet (20 mg total) by mouth daily.  Dispense: 90 tablet; Refill: 1 - Lipid panel  6. GAD (generalized anxiety disorder) Stress management  7. Insomnia Bedtime ritual - traZODone (DESYREL) 50 MG tablet; Take 1 tablet (50 mg total) by mouth at bedtime.  Dispense: 90 tablet; Refill: 1  8. Morbid obesity, unspecified obesity type ([place]) Discussed diet and exercise for person with BMI >25 Will recheck weight in 3-6 months   9. Vitamin D deficiency Labs drawn today  10. Osteopenia Weight bearing exercises - raloxifene (EVISTA) 60 MG tablet; Take 1 tablet (60 mg total) by mouth daily.  Dispense: 90 tablet; Refill: 1  Labs pending Health maintenance reviewed Diet and exercise encouraged Continue all meds Follow up  In 3 months   [place], FNP

## 2016-04-18 NOTE — Patient Instructions (Signed)
Fall Prevention in the Home  Falls can cause injuries and can affect people from all age groups. There are many simple things that you can do to make your home safe and to help prevent falls. WHAT CAN I DO ON THE OUTSIDE OF MY HOME?  Regularly repair the edges of walkways and driveways and fix any cracks.  Remove high doorway thresholds.  Trim any shrubbery on the main path into your home.  Use bright outdoor lighting.  Clear walkways of debris and clutter, including tools and rocks.  Regularly check that handrails are securely fastened and in good repair. Both sides of any steps should have handrails.  Install guardrails along the edges of any raised decks or porches.  Have leaves, snow, and ice cleared regularly.  Use sand or salt on walkways during winter months.  In the garage, clean up any spills right away, including grease or oil spills. WHAT CAN I DO IN THE BATHROOM?  Use night lights.  Install grab bars by the toilet and in the tub and shower. Do not use towel bars as grab bars.  Use non-skid mats or decals on the floor of the tub or shower.  If you need to sit down while you are in the shower, use a plastic, non-slip stool..  Keep the floor dry. Immediately clean up any water that spills on the floor.  Remove soap buildup in the tub or shower on a regular basis.  Attach bath mats securely with double-sided non-slip rug tape.  Remove throw rugs and other tripping hazards from the floor. WHAT CAN I DO IN THE BEDROOM?  Use night lights.  Make sure that a bedside light is easy to reach.  Do not use oversized bedding that drapes onto the floor.  Have a firm chair that has side arms to use for getting dressed.  Remove throw rugs and other tripping hazards from the floor. WHAT CAN I DO IN THE KITCHEN?   Clean up any spills right away.  Avoid walking on wet floors.  Place frequently used items in easy-to-reach places.  If you need to reach for something  above you, use a sturdy step stool that has a grab bar.  Keep electrical cables out of the way.  Do not use floor polish or wax that makes floors slippery. If you have to use wax, make sure that it is non-skid floor wax.  Remove throw rugs and other tripping hazards from the floor. WHAT CAN I DO IN THE STAIRWAYS?  Do not leave any items on the stairs.  Make sure that there are handrails on both sides of the stairs. Fix handrails that are broken or loose. Make sure that handrails are as long as the stairways.  Check any carpeting to make sure that it is firmly attached to the stairs. Fix any carpet that is loose or worn.  Avoid having throw rugs at the top or bottom of stairways, or secure the rugs with carpet tape to prevent them from moving.  Make sure that you have a light switch at the top of the stairs and the bottom of the stairs. If you do not have them, have them installed. WHAT ARE SOME OTHER FALL PREVENTION TIPS?  Wear closed-toe shoes that fit well and support your feet. Wear shoes that have rubber soles or low heels.  When you use a stepladder, make sure that it is completely opened and that the sides are firmly locked. Have someone hold the ladder while you   are using it. Do not climb a closed stepladder.  Add color or contrast paint or tape to grab bars and handrails in your home. Place contrasting color strips on the first and last steps.  Use mobility aids as needed, such as canes, walkers, scooters, and crutches.  Turn on lights if it is dark. Replace any light bulbs that burn out.  Set up furniture so that there are clear paths. Keep the furniture in the same spot.  Fix any uneven floor surfaces.  Choose a carpet design that does not hide the edge of steps of a stairway.  Be aware of any and all pets.  Review your medicines with your healthcare provider. Some medicines can cause dizziness or changes in blood pressure, which increase your risk of falling. Talk  with your health care provider about other ways that you can decrease your risk of falls. This may include working with a physical therapist or trainer to improve your strength, balance, and endurance.   This information is not intended to replace advice given to you by your health care provider. Make sure you discuss any questions you have with your health care provider.   Document Released: 10/24/2002 Document Revised: 03/20/2015 Document Reviewed: 12/08/2014 [employer] Interactive Patient Education 2016 [employer]  

## 2016-04-19 LAB — CMP14+EGFR
A/G RATIO: 2.3 — AB (ref 1.2–2.2)
ALT: 25 IU/L (ref 0–32)
AST: 19 IU/L (ref 0–40)
Albumin: 4.5 g/dL (ref 3.5–4.8)
Alkaline Phosphatase: 68 IU/L (ref 39–117)
BUN/Creatinine Ratio: 20 (ref 12–28)
BUN: 22 mg/dL (ref 8–27)
Bilirubin Total: 0.4 mg/dL (ref 0.0–1.2)
CALCIUM: 9.4 mg/dL (ref 8.7–10.3)
CO2: 22 mmol/L (ref 18–29)
Chloride: 100 mmol/L (ref 96–106)
Creatinine, Ser: 1.1 mg/dL — ABNORMAL HIGH (ref 0.57–1.00)
GFR, EST AFRICAN AMERICAN: 59 mL/min/{1.73_m2} — AB (ref >59)
GFR, EST NON AFRICAN AMERICAN: 51 mL/min/{1.73_m2} — AB (ref >59)
Globulin, Total: 2 g/dL (ref 1.5–4.5)
Glucose: 101 mg/dL — ABNORMAL HIGH (ref 65–99)
POTASSIUM: 4.3 mmol/L (ref 3.5–5.2)
Sodium: 140 mmol/L (ref 134–144)
TOTAL PROTEIN: 6.5 g/dL (ref 6.0–8.5)

## 2016-04-19 LAB — VITAMIN D 25 HYDROXY (VIT D DEFICIENCY, FRACTURES): VIT D 25 HYDROXY: 35.3 ng/mL (ref 30.0–100.0)

## 2016-04-19 LAB — LIPID PANEL
CHOL/HDL RATIO: 4.4 ratio (ref 0.0–4.4)
Cholesterol, Total: 201 mg/dL — ABNORMAL HIGH (ref 100–199)
HDL: 46 mg/dL (ref >39)
LDL Calculated: 120 mg/dL — ABNORMAL HIGH (ref 0–99)
Triglycerides: 173 mg/dL — ABNORMAL HIGH (ref 0–149)
VLDL CHOLESTEROL CAL: 35 mg/dL (ref 5–40)

## 2016-04-29 ENCOUNTER — Other Ambulatory Visit: Payer: Medicare Other

## 2016-04-29 DIAGNOSIS — Z1212 Encounter for screening for malignant neoplasm of rectum: Secondary | ICD-10-CM

## 2016-05-01 LAB — FECAL OCCULT BLOOD, IMMUNOCHEMICAL: Fecal Occult Bld: NEGATIVE

## 2016-05-02 NOTE — Progress Notes (Signed)
Pt.notified

## 2016-07-22 ENCOUNTER — Encounter: Payer: Self-pay | Admitting: Family Medicine

## 2016-07-22 ENCOUNTER — Ambulatory Visit (INDEPENDENT_AMBULATORY_CARE_PROVIDER_SITE_OTHER): Payer: Medicare Other | Admitting: Family Medicine

## 2016-07-22 VITALS — BP 112/63 | HR 65 | Temp 97.8°F | Ht 61.0 in | Wt 193.5 lb

## 2016-07-22 DIAGNOSIS — IMO0001 Reserved for inherently not codable concepts without codable children: Secondary | ICD-10-CM

## 2016-07-22 DIAGNOSIS — I10 Essential (primary) hypertension: Secondary | ICD-10-CM

## 2016-07-22 DIAGNOSIS — E785 Hyperlipidemia, unspecified: Secondary | ICD-10-CM

## 2016-07-22 DIAGNOSIS — M8580 Other specified disorders of bone density and structure, unspecified site: Secondary | ICD-10-CM | POA: Diagnosis not present

## 2016-07-22 DIAGNOSIS — K219 Gastro-esophageal reflux disease without esophagitis: Secondary | ICD-10-CM

## 2016-07-22 DIAGNOSIS — J449 Chronic obstructive pulmonary disease, unspecified: Secondary | ICD-10-CM | POA: Diagnosis not present

## 2016-07-22 MED ORDER — LOSARTAN POTASSIUM 100 MG PO TABS: 100.0000 mg | ORAL_TABLET | Freq: Every day | ORAL | 1 refills | Status: DC

## 2016-07-22 MED ORDER — RANITIDINE HCL 150 MG PO TABS: 150.0000 mg | ORAL_TABLET | Freq: Two times a day (BID) | ORAL | 1 refills | Status: DC

## 2016-07-22 MED ORDER — HYDROCHLOROTHIAZIDE 25 MG PO TABS: 25.0000 mg | ORAL_TABLET | Freq: Every day | ORAL | 1 refills | Status: DC

## 2016-07-22 MED ORDER — RALOXIFENE HCL 60 MG PO TABS: 60.0000 mg | ORAL_TABLET | Freq: Every day | ORAL | 1 refills | Status: DC

## 2016-07-22 MED ORDER — ROSUVASTATIN CALCIUM 20 MG PO TABS: 20.0000 mg | ORAL_TABLET | Freq: Every day | ORAL | 1 refills | Status: DC

## 2016-07-22 MED ORDER — CHLORPHENIRAMINE MALEATE 4 MG PO TABS: 4.0000 mg | ORAL_TABLET | Freq: Every evening | ORAL | 0 refills | Status: DC | PRN

## 2016-07-22 MED ORDER — BUDESONIDE-FORMOTEROL FUMARATE 80-4.5 MCG/ACT IN AERO: 2.0000 | INHALATION_SPRAY | Freq: Two times a day (BID) | RESPIRATORY_TRACT | 3 refills | Status: DC | PRN

## 2016-07-22 MED ORDER — ALBUTEROL SULFATE HFA 108 (90 BASE) MCG/ACT IN AERS: 2.0000 | INHALATION_SPRAY | Freq: Four times a day (QID) | RESPIRATORY_TRACT | 0 refills | Status: DC | PRN

## 2016-07-22 NOTE — Progress Notes (Signed)
Subjective:  Patient ID: Renee Maldonado, female    DOB: 20-Jun-1946  Age: 70 y.o. MRN: [id number]  CC: 3 month follow up (needs bloodwork and refill on her Crestor)   HPI Renee Maldonado presents for  follow-up of hypertension. Patient has no history of headache chest pain or shortness of breath or recent cough. Patient also denies symptoms of TIA such as numbness weakness lateralizing. Patient checks  blood pressure at home and has not had any elevated readings recently. Patient denies side effects from his medication. States taking it regularly.  Patient also  in for follow-up of elevated cholesterol. Doing well without complaints on current medication. Denies side effects of statin including myalgia and arthralgia and nausea. Also in today for liver function testing. Currently no chest pain, shortness of breath or other cardiovascular related symptoms noted.   History Renee Maldonado has a past medical history of Anxiety; GERD (gastroesophageal reflux disease); Humerus fracture (2007); Hyperlipidemia; Hypertension; and Insomnia.   She has a past surgical history that includes Total knee arthroplasty; Left shoulder surgery; Joint replacement; and Tubal ligation.   Her family history includes Alzheimer's disease in her mother; Cancer in her brother; Diabetes in her mother; Hypertension in her father; Stroke in her father.She reports that she quit smoking about 22 months ago. Her smoking use included Cigarettes. She has a 20.00 pack-year smoking history. She has never used smokeless tobacco. She reports that she drinks alcohol. She reports that she does not use drugs.  Current Outpatient Prescriptions on File Prior to Visit  Medication Sig Dispense Refill  . albuterol (PROVENTIL HFA;VENTOLIN HFA) 108 (90 BASE) MCG/ACT inhaler Inhale 2 puffs into the lungs every 6 (six) hours as needed for wheezing or shortness of breath. 1 Inhaler 0  . aspirin 81 MG tablet Take 81 mg by mouth daily.    . Aspirin-Caffeine  (BAYER BACK & BODY PAIN EX ST) 500-32.5 MG TABS Take 1 tablet by mouth daily as needed.    . budesonide-formoterol (SYMBICORT) 80-4.5 MCG/ACT inhaler Inhale 2 puffs into the lungs 2 (two) times daily as needed. 1 Inhaler 3  . Calcium-Vitamin D-Vitamin K (CALCIUM + D + K PO) Take 1 tablet by mouth 2 (two) times daily before a meal.     . chlorpheniramine (CHLOR-TRIMETON) 4 MG tablet Take 4 mg by mouth at bedtime as needed for allergies.    . cholecalciferol (VITAMIN D) 1000 UNITS tablet Take 1,000 Units by mouth daily.    . fish oil-omega-3 fatty acids 1000 MG capsule Take 1 g by mouth 2 (two) times daily before a meal.    . fluticasone (FLONASE) 50 MCG/ACT nasal spray Place into both nostrils daily.    . hydrochlorothiazide (HYDRODIURIL) 25 MG tablet Take 1 tablet (25 mg total) by mouth daily. 90 tablet 1  . losartan (COZAAR) 100 MG tablet Take 1 tablet (100 mg total) by mouth daily. 90 tablet 1  . Lysine 500 MG TABS Take by mouth 2 (two) times daily before a meal.    . Melatonin 5 MG TABS Take 1 tablet by mouth daily.    . Multiple Vitamins-Minerals (SM MULTIPLE VITAMINS WOMENS PO) Take 1 tablet by mouth daily.    . raloxifene (EVISTA) 60 MG tablet Take 1 tablet (60 mg total) by mouth daily. 90 tablet 1  . ranitidine (ZANTAC) 150 MG tablet Take 1 tablet (150 mg total) by mouth 2 (two) times daily. 180 tablet 1  . RESTASIS 0.05 % ophthalmic emulsion Place 1 drop into both  eyes 2 (two) times daily as needed.     . rosuvastatin (CRESTOR) 20 MG tablet Take 1 tablet (20 mg total) by mouth daily. 90 tablet 1  . traZODone (DESYREL) 50 MG tablet Take 1 tablet (50 mg total) by mouth at bedtime. 90 tablet 1   No current facility-administered medications on file prior to visit.     ROS Review of Systems  Objective:  BP 112/63   Pulse 65   Temp 97.8 F (36.6 C) (Oral)   Ht '5\' 1"'  (1.549 m)   Wt 193 lb 8 oz (87.8 kg)   BMI 36.56 kg/m   BP Readings from Last 3 Encounters:  07/22/16 112/63    04/18/16 126/72  11/30/15 134/82    Wt Readings from Last 3 Encounters:  07/22/16 193 lb 8 oz (87.8 kg)  04/18/16 199 lb (90.3 kg)  11/30/15 198 lb 3.2 oz (89.9 kg)     Physical Exam  Lab Results  Component Value Date   HGBA1C 5.6 09/19/2015    Lab Results  Component Value Date   WBC 6.4 04/26/2014   HGB 13.1 04/26/2014   HCT 40.7 04/26/2014   PLT 257 12/14/2013   GLUCOSE 101 (H) 04/18/2016   CHOL 201 (H) 04/18/2016   TRIG 173 (H) 04/18/2016   HDL 46 04/18/2016   LDLCALC 120 (H) 04/18/2016   ALT 25 04/18/2016   AST 19 04/18/2016   NA 140 04/18/2016   K 4.3 04/18/2016   CL 100 04/18/2016   CREATININE 1.10 (H) 04/18/2016   BUN 22 04/18/2016   CO2 22 04/18/2016   HGBA1C 5.6 09/19/2015    Ct Head Wo Contrast  Result Date: 12/27/2009 Clinical Data:  Status post fall, with laceration to the left eyelids, nose and chin; left facial pain and headache.  CT HEAD WITHOUT CONTRAST CT MAXILLOFACIAL WITHOUT CONTRAST  Technique:  Multidetector CT imaging of the head and maxillofacial structures were performed using the standard protocol without intravenous contrast. Multiplanar CT image reconstructions of the maxillofacial structures were also generated.  Comparison:  None.  CT HEAD  Findings: There is no evidence of acute infarction, mass lesion, or intra- or extra-axial hemorrhage on CT.  The posterior fossa, including the cerebellum, brainstem and fourth ventricle, is within normal limits.  The third and lateral ventricles, and basal ganglia are unremarkable in appearance.  The cerebral hemispheres are symmetric in appearance, with normal gray- white differentiation.  No mass effect or midline shift is seen.  There is a fracture through the left orbital floor, with herniation of intraorbital fat into the left maxillary sinus.  This is better characterized on the maxillofacial CT.  A small amount of fluid is noted within the left maxillary sinus.  A mucus retention cyst or polyp is  noted within the right maxillary sinus; the remaining paranasal sinuses and mastoid air cells are well-aerated.  Mild soft tissue stranding is noted along the left eyelids and overlying the left maxilla.  IMPRESSION:  1.  No evidence of traumatic intracranial injury. 2.  Fracture through the left orbital floor, with herniation of intraorbital fat into the left maxillary sinus. 3.  Small amount of blood noted within the left maxillary sinus; mucus retention cyst or polyp noted in the right maxillary sinus. 4.  Mild soft tissue stranding along the left eyelids and overlying the left maxilla.  CT MAXILLOFACIAL  Findings:   There is a small fracture through the left orbital floor, with herniation of a 1.0 cm portion of  intraorbital fat into the left maxillary sinus.  Minimal adjacent high density likely reflects blood.  There is no evidence of herniation of extraocular musculature.  No blood is identified within the orbit.  No additional fractures are seen.  The maxilla and mandible appear intact.  The nasal bone is unremarkable in appearance.  There is mild soft tissue thickening and disruption at the left eyelids, and mild soft tissue stranding overlying the left maxilla. Soft tissue thickening and very small hematoma are seen overlying the central mandible.  A prominent mucus retention cyst or polyp is noted within the right maxillary sinus.  A small amount of fluid is noted within the left maxillary sinus, reflecting the orbital floor fracture.  The remaining paranasal sinuses and mastoid air cells are well-aerated. The right orbit appears intact.  No additional soft tissue abnormalities are seen.  The parotid and submandibular glands are within normal limits.  The parapharyngeal fat planes are preserved.  IMPRESSION:  1.  Small fracture through the left orbital floor, with herniation of a 1.0 cm portion of intraorbital fat into the left maxillary sinus.  Minimal adjacent blood noted.  No evidence of herniation of  extraocular musculature or blood within the orbit. 2.  Small amount of blood noted within the left maxillary sinus. 3.  Soft tissue thickening and very small hematoma overlying the central mandible; mild soft tissue thickening disruption at the left eyelids, and mild soft tissue stranding overlying the left maxilla. 4.  Prominent mucus retention cyst or polyp noted within the right maxillary sinus.  Findings were discussed with Dr. Kingsley Spittle on 12/27/2009 at 10:11 p.m. Provider: Alfonse Flavors  Ct Maxillofacial Wo Cm  Result Date: 12/27/2009 Clinical Data:  Status post fall, with laceration to the left eyelids, nose and chin; left facial pain and headache.  CT HEAD WITHOUT CONTRAST CT MAXILLOFACIAL WITHOUT CONTRAST  Technique:  Multidetector CT imaging of the head and maxillofacial structures were performed using the standard protocol without intravenous contrast. Multiplanar CT image reconstructions of the maxillofacial structures were also generated.  Comparison:  None.  CT HEAD  Findings: There is no evidence of acute infarction, mass lesion, or intra- or extra-axial hemorrhage on CT.  The posterior fossa, including the cerebellum, brainstem and fourth ventricle, is within normal limits.  The third and lateral ventricles, and basal ganglia are unremarkable in appearance.  The cerebral hemispheres are symmetric in appearance, with normal gray- white differentiation.  No mass effect or midline shift is seen.  There is a fracture through the left orbital floor, with herniation of intraorbital fat into the left maxillary sinus.  This is better characterized on the maxillofacial CT.  A small amount of fluid is noted within the left maxillary sinus.  A mucus retention cyst or polyp is noted within the right maxillary sinus; the remaining paranasal sinuses and mastoid air cells are well-aerated.  Mild soft tissue stranding is noted along the left eyelids and overlying the left maxilla.  IMPRESSION:  1.  No evidence  of traumatic intracranial injury. 2.  Fracture through the left orbital floor, with herniation of intraorbital fat into the left maxillary sinus. 3.  Small amount of blood noted within the left maxillary sinus; mucus retention cyst or polyp noted in the right maxillary sinus. 4.  Mild soft tissue stranding along the left eyelids and overlying the left maxilla.  CT MAXILLOFACIAL  Findings:   There is a small fracture through the left orbital floor, with herniation of a 1.0 cm  portion of intraorbital fat into the left maxillary sinus.  Minimal adjacent high density likely reflects blood.  There is no evidence of herniation of extraocular musculature.  No blood is identified within the orbit.  No additional fractures are seen.  The maxilla and mandible appear intact.  The nasal bone is unremarkable in appearance.  There is mild soft tissue thickening and disruption at the left eyelids, and mild soft tissue stranding overlying the left maxilla. Soft tissue thickening and very small hematoma are seen overlying the central mandible.  A prominent mucus retention cyst or polyp is noted within the right maxillary sinus.  A small amount of fluid is noted within the left maxillary sinus, reflecting the orbital floor fracture.  The remaining paranasal sinuses and mastoid air cells are well-aerated. The right orbit appears intact.  No additional soft tissue abnormalities are seen.  The parotid and submandibular glands are within normal limits.  The parapharyngeal fat planes are preserved.  IMPRESSION:  1.  Small fracture through the left orbital floor, with herniation of a 1.0 cm portion of intraorbital fat into the left maxillary sinus.  Minimal adjacent blood noted.  No evidence of herniation of extraocular musculature or blood within the orbit. 2.  Small amount of blood noted within the left maxillary sinus. 3.  Soft tissue thickening and very small hematoma overlying the central mandible; mild soft tissue thickening  disruption at the left eyelids, and mild soft tissue stranding overlying the left maxilla. 4.  Prominent mucus retention cyst or polyp noted within the right maxillary sinus.  Findings were discussed with Dr. Kingsley Spittle on 12/27/2009 at 10:11 p.m. Provider: Alfonse Flavors   Assessment & Plan:   Renee Maldonado was seen today for 3 month follow up.  Diagnoses and all orders for this visit:  Hyperlipidemia -     CBC with Differential/Platelet -     CMP14+EGFR -     Lipid panel  Essential hypertension -     CBC with Differential/Platelet -     CMP14+EGFR  Morbid obesity, unspecified obesity type ([place]) -     CBC with Differential/Platelet -     CMP14+EGFR  COPD bronchitis -     CBC with Differential/Platelet -     CMP14+EGFR   I am having Renee Maldonado maintain her cholecalciferol, fish oil-omega-3 fatty acids, Calcium-Vitamin D-Vitamin K (CALCIUM + D + K PO), Lysine, albuterol, RESTASIS, Melatonin, chlorpheniramine, aspirin, Aspirin-Caffeine, Multiple Vitamins-Minerals (SM MULTIPLE VITAMINS WOMENS PO), fluticasone, budesonide-formoterol, ranitidine, rosuvastatin, traZODone, raloxifene, losartan, and hydrochlorothiazide.  No orders of the defined types were placed in this encounter.    Follow-up: Return in about 3 months (around 10/21/2016).  Renee Maldonado, M.D.

## 2016-07-22 NOTE — Addendum Note (Signed)
Addended by: Marylin Crosby on: 07/22/2016 05:02 PM   Modules accepted: Orders

## 2016-07-23 ENCOUNTER — Other Ambulatory Visit: Payer: Self-pay | Admitting: Family Medicine

## 2016-07-23 LAB — CMP14+EGFR
ALBUMIN: 4.4 g/dL (ref 3.5–4.8)
ALT: 19 IU/L (ref 0–32)
AST: 21 IU/L (ref 0–40)
Albumin/Globulin Ratio: 2.2 (ref 1.2–2.2)
Alkaline Phosphatase: 69 IU/L (ref 39–117)
BILIRUBIN TOTAL: 0.4 mg/dL (ref 0.0–1.2)
BUN / CREAT RATIO: 24 (ref 12–28)
BUN: 28 mg/dL — AB (ref 8–27)
CHLORIDE: 101 mmol/L (ref 96–106)
CO2: 26 mmol/L (ref 18–29)
CREATININE: 1.17 mg/dL — AB (ref 0.57–1.00)
Calcium: 9.7 mg/dL (ref 8.7–10.3)
GFR calc non Af Amer: 47 mL/min/{1.73_m2} — ABNORMAL LOW (ref >59)
GFR, EST AFRICAN AMERICAN: 55 mL/min/{1.73_m2} — AB (ref >59)
GLOBULIN, TOTAL: 2 g/dL (ref 1.5–4.5)
GLUCOSE: 110 mg/dL — AB (ref 65–99)
Potassium: 4.9 mmol/L (ref 3.5–5.2)
Sodium: 141 mmol/L (ref 134–144)
TOTAL PROTEIN: 6.4 g/dL (ref 6.0–8.5)

## 2016-07-23 LAB — CBC WITH DIFFERENTIAL/PLATELET
BASOS ABS: 0 10*3/uL (ref 0.0–0.2)
BASOS: 1 %
EOS (ABSOLUTE): 0.2 10*3/uL (ref 0.0–0.4)
EOS: 4 %
HEMATOCRIT: 37.8 % (ref 34.0–46.6)
HEMOGLOBIN: 12.5 g/dL (ref 11.1–15.9)
IMMATURE GRANS (ABS): 0 10*3/uL (ref 0.0–0.1)
Immature Granulocytes: 0 %
LYMPHS: 28 %
Lymphocytes Absolute: 1.1 10*3/uL (ref 0.7–3.1)
MCH: 27.5 pg (ref 26.6–33.0)
MCHC: 33.1 g/dL (ref 31.5–35.7)
MCV: 83 fL (ref 79–97)
MONOCYTES: 8 %
Monocytes Absolute: 0.3 10*3/uL (ref 0.1–0.9)
NEUTROS ABS: 2.3 10*3/uL (ref 1.4–7.0)
Neutrophils: 59 %
Platelets: 224 10*3/uL (ref 150–379)
RBC: 4.55 x10E6/uL (ref 3.77–5.28)
RDW: 13.9 % (ref 12.3–15.4)
WBC: 3.9 10*3/uL (ref 3.4–10.8)

## 2016-07-23 LAB — LIPID PANEL
Chol/HDL Ratio: 4 ratio units (ref 0.0–4.4)
Cholesterol, Total: 143 mg/dL (ref 100–199)
HDL: 36 mg/dL — ABNORMAL LOW (ref >39)
LDL CALC: 73 mg/dL (ref 0–99)
Triglycerides: 168 mg/dL — ABNORMAL HIGH (ref 0–149)
VLDL CHOLESTEROL CAL: 34 mg/dL (ref 5–40)

## 2016-07-24 LAB — HGB A1C W/O EAG: HEMOGLOBIN A1C: 5.8 % — AB (ref 4.8–5.6)

## 2016-07-24 LAB — SPECIMEN STATUS REPORT

## 2016-07-29 ENCOUNTER — Telehealth: Payer: Self-pay | Admitting: Family Medicine

## 2016-07-29 NOTE — Telephone Encounter (Signed)
Patient aware of results.

## 2016-10-22 ENCOUNTER — Telehealth: Payer: Self-pay | Admitting: Family Medicine

## 2016-10-22 ENCOUNTER — Encounter: Payer: Self-pay | Admitting: Family Medicine

## 2016-10-22 ENCOUNTER — Ambulatory Visit (INDEPENDENT_AMBULATORY_CARE_PROVIDER_SITE_OTHER): Payer: Medicare Other | Admitting: Family Medicine

## 2016-10-22 VITALS — BP 130/81 | HR 66 | Temp 98.0°F | Ht 61.0 in | Wt 195.0 lb

## 2016-10-22 DIAGNOSIS — R7309 Other abnormal glucose: Secondary | ICD-10-CM | POA: Diagnosis not present

## 2016-10-22 DIAGNOSIS — E782 Mixed hyperlipidemia: Secondary | ICD-10-CM

## 2016-10-22 DIAGNOSIS — I10 Essential (primary) hypertension: Secondary | ICD-10-CM

## 2016-10-22 DIAGNOSIS — R7303 Prediabetes: Secondary | ICD-10-CM

## 2016-10-22 DIAGNOSIS — B001 Herpesviral vesicular dermatitis: Secondary | ICD-10-CM

## 2016-10-22 LAB — BAYER DCA HB A1C WAIVED: HB A1C: 5.5 % (ref <7.0)

## 2016-10-22 NOTE — Progress Notes (Signed)
Subjective:  Patient ID: Renee Maldonado, female    DOB: Aug 05, 1946  Age: 70 y.o. MRN: [id number]  CC: Hyperlipidemia (3 month recheck) and Hypertension   HPI Renee Maldonado presents for  follow-up of hypertension. Patient has no history of headache chest pain or shortness of breath or recent cough. Patient also denies symptoms of TIA such as numbness weakness lateralizing. Patient checks  blood pressure at home and has not had any elevated readings recently. Patient denies side effects from his medication. States taking it regularly.  Patient also  in for follow-up of elevated cholesterol. Doing well without complaints on current medication. Denies side effects of statin including myalgia and arthralgia and nausea. Also in today for liver function testing. Currently no chest pain, shortness of breath or other cardiovascular related symptoms noted.   History Renee Maldonado has a past medical history of Anxiety; GERD (gastroesophageal reflux disease); Humerus fracture (2007); Hyperlipidemia; Hypertension; and Insomnia.   Renee Maldonado has a past surgical history that includes Total knee arthroplasty; Left shoulder surgery; Joint replacement; and Tubal ligation.   Renee Maldonado family history includes Alzheimer's disease in Renee Maldonado mother; Cancer in Renee Maldonado brother; Diabetes in Renee Maldonado mother; Hypertension in Renee Maldonado father; Stroke in Renee Maldonado father.Renee Maldonado reports that Renee Maldonado quit smoking about 2 years ago. Renee Maldonado smoking use included Cigarettes. Renee Maldonado has a 20.00 pack-year smoking history. Renee Maldonado has never used smokeless tobacco. Renee Maldonado reports that Renee Maldonado drinks alcohol. Renee Maldonado reports that Renee Maldonado does not use drugs.  Current Outpatient Prescriptions on File Prior to Visit  Medication Sig Dispense Refill  . albuterol (PROVENTIL HFA;VENTOLIN HFA) 108 (90 Base) MCG/ACT inhaler Inhale 2 puffs into the lungs every 6 (six) hours as needed for wheezing or shortness of breath. 1 Inhaler 0  . aspirin 81 MG tablet Take 81 mg by mouth daily.    . Aspirin-Caffeine (BAYER BACK &  BODY PAIN EX ST) 500-32.5 MG TABS Take 1 tablet by mouth daily as needed.    . budesonide-formoterol (SYMBICORT) 80-4.5 MCG/ACT inhaler Inhale 2 puffs into the lungs 2 (two) times daily as needed. 1 Inhaler 3  . Calcium-Vitamin D-Vitamin K (CALCIUM + D + K PO) Take 1 tablet by mouth 2 (two) times daily before a meal.     . chlorpheniramine (CHLOR-TRIMETON) 4 MG tablet Take 1 tablet (4 mg total) by mouth at bedtime as needed for allergies. 30 tablet 0  . cholecalciferol (VITAMIN D) 1000 UNITS tablet Take 1,000 Units by mouth daily.    . fish oil-omega-3 fatty acids 1000 MG capsule Take 1 g by mouth 2 (two) times daily before a meal.    . fluticasone (FLONASE) 50 MCG/ACT nasal spray Place into both nostrils daily.    . hydrochlorothiazide (HYDRODIURIL) 25 MG tablet Take 1 tablet (25 mg total) by mouth daily. 90 tablet 1  . losartan (COZAAR) 100 MG tablet Take 1 tablet (100 mg total) by mouth daily. 90 tablet 1  . Lysine 500 MG TABS Take by mouth 2 (two) times daily before a meal.    . Melatonin 5 MG TABS Take 1 tablet by mouth daily.    . Multiple Vitamins-Minerals (SM MULTIPLE VITAMINS WOMENS PO) Take 1 tablet by mouth daily.    . raloxifene (EVISTA) 60 MG tablet Take 1 tablet (60 mg total) by mouth daily. 90 tablet 1  . ranitidine (ZANTAC) 150 MG tablet Take 1 tablet (150 mg total) by mouth 2 (two) times daily. 180 tablet 1  . RESTASIS 0.05 % ophthalmic emulsion Place 1 drop into both eyes  2 (two) times daily as needed.     . rosuvastatin (CRESTOR) 20 MG tablet Take 1 tablet (20 mg total) by mouth daily. 90 tablet 1  . traZODone (DESYREL) 50 MG tablet Take 1 tablet (50 mg total) by mouth at bedtime. 90 tablet 1   No current facility-administered medications on file prior to visit.     ROS Review of Systems  Constitutional: Negative for activity change, appetite change and fever.  HENT: Negative for congestion, rhinorrhea and sore throat.   Eyes: Negative for visual disturbance.    Respiratory: Negative for cough and shortness of breath.   Cardiovascular: Negative for chest pain and palpitations.  Gastrointestinal: Negative for abdominal pain, diarrhea and nausea.  Genitourinary: Negative for dysuria.  Musculoskeletal: Negative for arthralgias and myalgias.  Skin: Rash: :Frequently has pinpoint blisters on the lip and philtrum. These are painful.    Objective:  BP 130/81   Pulse 66   Temp 98 F (36.7 C) (Oral)   Ht 5' 1" (1.549 m)   Wt 195 lb (88.5 kg)   BMI 36.84 kg/m   BP Readings from Last 3 Encounters:  10/22/16 130/81  07/22/16 112/63  04/18/16 126/72    Wt Readings from Last 3 Encounters:  10/22/16 195 lb (88.5 kg)  07/22/16 193 lb 8 oz (87.8 kg)  04/18/16 199 lb (90.3 kg)     Physical Exam  Constitutional: Renee Maldonado is oriented to person, place, and time. Renee Maldonado appears well-developed and well-nourished. No distress.  HENT:  Head: Normocephalic and atraumatic.  Eyes: Conjunctivae are normal. Pupils are equal, round, and reactive to light.  Neck: Normal range of motion. Neck supple. No thyromegaly present.  Cardiovascular: Normal rate, regular rhythm and normal heart sounds.   No murmur heard. Pulmonary/Chest: Effort normal and breath sounds normal. No respiratory distress. Renee Maldonado has no wheezes. Renee Maldonado has no rales.  Abdominal: Soft. Bowel sounds are normal. Renee Maldonado exhibits no distension. There is no tenderness.  Musculoskeletal: Normal range of motion.  Lymphadenopathy:    Renee Maldonado has no cervical adenopathy.  Neurological: Renee Maldonado is alert and oriented to person, place, and time.  Skin: Skin is warm and dry. Rash (pinpoint vesicles & excoriations at left upper lip and philtrum) noted.  Psychiatric: Renee Maldonado has a normal mood and affect. Renee Maldonado behavior is normal. Judgment and thought content normal.    Lab Results  Component Value Date   HGBA1C 5.8 (H) 07/22/2016   HGBA1C 5.6 09/19/2015    Lab Results  Component Value Date   WBC 3.9 07/22/2016   HGB 13.1  04/26/2014   HCT 37.8 07/22/2016   PLT 224 07/22/2016   GLUCOSE 110 (H) 10/22/2016   CHOL 143 07/22/2016   TRIG 168 (H) 07/22/2016   HDL 36 (L) 07/22/2016   LDLCALC 73 07/22/2016   ALT 29 10/22/2016   AST 21 10/22/2016   NA 141 10/22/2016   K 4.8 10/22/2016   CL 100 10/22/2016   CREATININE 1.06 (H) 10/22/2016   BUN 21 10/22/2016   CO2 27 10/22/2016   HGBA1C 5.8 (H) 07/22/2016      Assessment & Plan:   Renee Maldonado was seen today for hyperlipidemia and hypertension.  Diagnoses and all orders for this visit:  Mixed hyperlipidemia -     CMP14+EGFR  Essential hypertension -     CMP14+EGFR  Elevated random blood glucose level -     CMP14+EGFR -     Bayer DCA Hb A1c Waived  Prediabetes  Herpes labialis  Other orders -  valACYclovir (VALTREX) 500 MG tablet; Take 1 tablet (500 mg total) by mouth daily.   I am having Renee Maldonado start on valACYclovir. I am also having Renee Maldonado maintain Renee Maldonado cholecalciferol, fish oil-omega-3 fatty acids, Calcium-Vitamin D-Vitamin K (CALCIUM + D + K PO), Lysine, RESTASIS, Melatonin, aspirin, Aspirin-Caffeine, Multiple Vitamins-Minerals (SM MULTIPLE VITAMINS WOMENS PO), fluticasone, traZODone, chlorpheniramine, raloxifene, rosuvastatin, ranitidine, losartan, hydrochlorothiazide, budesonide-formoterol, and albuterol.  Meds ordered this encounter  Medications  . valACYclovir (VALTREX) 500 MG tablet    Sig: Take 1 tablet (500 mg total) by mouth daily.    Dispense:  30 tablet    Refill:  11     Follow-up: Return in about 6 months (around 04/22/2017).  Claretta Fraise, M.D.

## 2016-10-23 LAB — CMP14+EGFR
A/G RATIO: 1.8 (ref 1.2–2.2)
ALK PHOS: 86 IU/L (ref 39–117)
ALT: 29 IU/L (ref 0–32)
AST: 21 IU/L (ref 0–40)
Albumin: 4.2 g/dL (ref 3.5–4.8)
BILIRUBIN TOTAL: 0.5 mg/dL (ref 0.0–1.2)
BUN/Creatinine Ratio: 20 (ref 12–28)
BUN: 21 mg/dL (ref 8–27)
CHLORIDE: 100 mmol/L (ref 96–106)
CO2: 27 mmol/L (ref 18–29)
CREATININE: 1.06 mg/dL — AB (ref 0.57–1.00)
Calcium: 9.2 mg/dL (ref 8.7–10.3)
GFR calc Af Amer: 61 mL/min/{1.73_m2} (ref >59)
GFR calc non Af Amer: 53 mL/min/{1.73_m2} — ABNORMAL LOW (ref >59)
GLOBULIN, TOTAL: 2.3 g/dL (ref 1.5–4.5)
Glucose: 110 mg/dL — ABNORMAL HIGH (ref 65–99)
POTASSIUM: 4.8 mmol/L (ref 3.5–5.2)
SODIUM: 141 mmol/L (ref 134–144)
Total Protein: 6.5 g/dL (ref 6.0–8.5)

## 2016-10-23 MED ORDER — VALACYCLOVIR HCL 500 MG PO TABS: 500.0000 mg | ORAL_TABLET | Freq: Every day | ORAL | 11 refills | Status: DC

## 2016-11-12 ENCOUNTER — Other Ambulatory Visit: Payer: Self-pay | Admitting: Nurse Practitioner

## 2016-11-12 DIAGNOSIS — G4700 Insomnia, unspecified: Secondary | ICD-10-CM

## 2016-12-31 ENCOUNTER — Ambulatory Visit (INDEPENDENT_AMBULATORY_CARE_PROVIDER_SITE_OTHER): Payer: Medicare Other | Admitting: Family Medicine

## 2016-12-31 ENCOUNTER — Encounter: Payer: Self-pay | Admitting: Family Medicine

## 2016-12-31 VITALS — BP 130/73 | HR 79 | Temp 100.8°F | Ht 61.0 in | Wt 194.0 lb

## 2016-12-31 DIAGNOSIS — J111 Influenza due to unidentified influenza virus with other respiratory manifestations: Secondary | ICD-10-CM

## 2016-12-31 MED ORDER — OSELTAMIVIR PHOSPHATE 75 MG PO CAPS: 75.0000 mg | ORAL_CAPSULE | Freq: Two times a day (BID) | ORAL | 0 refills | Status: DC

## 2016-12-31 MED ORDER — ONDANSETRON 8 MG PO TBDP: 8.0000 mg | ORAL_TABLET | Freq: Four times a day (QID) | ORAL | 1 refills | Status: DC | PRN

## 2016-12-31 MED ORDER — GUAIFENESIN-CODEINE 100-10 MG/5ML PO SYRP: 5.0000 mL | ORAL_SOLUTION | ORAL | 0 refills | Status: DC | PRN

## 2016-12-31 NOTE — Progress Notes (Signed)
Subjective:  Patient ID: Sameeha Rockefeller, female    DOB: 04/01/1946  Age: 71 y.o. MRN: [id number]  CC: Fever (pt here today c/o cough, fever, headaches, congestion and just "feeling terrible" since Monday night)   HPI Stacyann Lyday presents for  Patient presents with severe dry cough & stuffy nose. Diffuse headache of moderate intensity. Patient also has chills and 100.6 fever. Body aches worst in the back but present in the legs, shoulders, and torso as well. Has sapped the energy to the point that of being unable to perform usual activities other than ADLs. Onset 2 days ago. Nauseous as well   History Adriona has a past medical history of Anxiety; GERD (gastroesophageal reflux disease); Humerus fracture (2007); Hyperlipidemia; Hypertension; and Insomnia.   She has a past surgical history that includes Total knee arthroplasty; Left shoulder surgery; Joint replacement; and Tubal ligation.   Her family history includes Alzheimer's disease in her mother; Cancer in her brother; Diabetes in her mother; Hypertension in her father; Stroke in her father.She reports that she quit smoking about 2 years ago. Her smoking use included Cigarettes. She has a 20.00 pack-year smoking history. She has never used smokeless tobacco. She reports that she drinks alcohol. She reports that she does not use drugs.  Current Outpatient Prescriptions on File Prior to Visit  Medication Sig Dispense Refill  . albuterol (PROVENTIL HFA;VENTOLIN HFA) 108 (90 Base) MCG/ACT inhaler Inhale 2 puffs into the lungs every 6 (six) hours as needed for wheezing or shortness of breath. 1 Inhaler 0  . aspirin 81 MG tablet Take 81 mg by mouth daily.    . Aspirin-Caffeine (BAYER BACK & BODY PAIN EX ST) 500-32.5 MG TABS Take 1 tablet by mouth daily as needed.    . budesonide-formoterol (SYMBICORT) 80-4.5 MCG/ACT inhaler Inhale 2 puffs into the lungs 2 (two) times daily as needed. 1 Inhaler 3  . Calcium-Vitamin D-Vitamin K (CALCIUM + D + K PO)  Take 1 tablet by mouth 2 (two) times daily before a meal.     . chlorpheniramine (CHLOR-TRIMETON) 4 MG tablet Take 1 tablet (4 mg total) by mouth at bedtime as needed for allergies. 30 tablet 0  . cholecalciferol (VITAMIN D) 1000 UNITS tablet Take 1,000 Units by mouth daily.    . fish oil-omega-3 fatty acids 1000 MG capsule Take 1 g by mouth 2 (two) times daily before a meal.    . fluticasone (FLONASE) 50 MCG/ACT nasal spray Place into both nostrils daily.    . hydrochlorothiazide (HYDRODIURIL) 25 MG tablet Take 1 tablet (25 mg total) by mouth daily. 90 tablet 1  . losartan (COZAAR) 100 MG tablet Take 1 tablet (100 mg total) by mouth daily. 90 tablet 1  . Lysine 500 MG TABS Take by mouth 2 (two) times daily before a meal.    . Melatonin 5 MG TABS Take 1 tablet by mouth daily.    . Multiple Vitamins-Minerals (SM MULTIPLE VITAMINS WOMENS PO) Take 1 tablet by mouth daily.    . raloxifene (EVISTA) 60 MG tablet Take 1 tablet (60 mg total) by mouth daily. 90 tablet 1  . ranitidine (ZANTAC) 150 MG tablet Take 1 tablet (150 mg total) by mouth 2 (two) times daily. 180 tablet 1  . RESTASIS 0.05 % ophthalmic emulsion Place 1 drop into both eyes 2 (two) times daily as needed.     . rosuvastatin (CRESTOR) 20 MG tablet Take 1 tablet (20 mg total) by mouth daily. 90 tablet 1  . traZODone (  DESYREL) 50 MG tablet TAKE ONE TABLET AT BEDTIME 90 tablet 0  . valACYclovir (VALTREX) 500 MG tablet Take 1 tablet (500 mg total) by mouth daily. 30 tablet 11   No current facility-administered medications on file prior to visit.     ROS Review of Systems  Constitutional: Positive for appetite change, chills and fever.  HENT: Positive for congestion and rhinorrhea. Negative for ear pain, nosebleeds, postnasal drip, sinus pressure and sore throat.   Respiratory: Negative for chest tightness and shortness of breath.   Cardiovascular: Negative for chest pain.  Musculoskeletal: Positive for myalgias.  Skin: Negative for  rash.  Neurological: Positive for headaches.    Objective:  BP 130/73   Pulse 79   Temp (!) 100.8 F (38.2 C) (Oral)   Ht '5\' 1"'$  (1.549 m)   Wt 194 lb (88 kg)   BMI 36.66 kg/m   Physical Exam  Constitutional: She appears well-developed and well-nourished.  HENT:  Head: Normocephalic and atraumatic.  Right Ear: Tympanic membrane and external ear normal. No decreased hearing is noted.  Left Ear: Tympanic membrane and external ear normal. No decreased hearing is noted.  Nose: Mucosal edema present. Right sinus exhibits no frontal sinus tenderness. Left sinus exhibits no frontal sinus tenderness.  Mouth/Throat: No oropharyngeal exudate or posterior oropharyngeal erythema.  Neck: No Brudzinski's sign noted.  Pulmonary/Chest: Breath sounds normal. No respiratory distress.  Lymphadenopathy:       Head (right side): No preauricular adenopathy present.       Head (left side): No preauricular adenopathy present.       Right cervical: No superficial cervical adenopathy present.      Left cervical: No superficial cervical adenopathy present.    Assessment & Plan:   Maisie was seen today for fever.  Diagnoses and all orders for this visit:  Influenza with respiratory manifestation  Other orders -     oseltamivir (TAMIFLU) 75 MG capsule; Take 1 capsule (75 mg total) by mouth 2 (two) times daily. -     guaiFENesin-codeine (CHERATUSSIN AC) 100-10 MG/5ML syrup; Take 5 mLs by mouth every 4 (four) hours as needed for cough. -     ondansetron (ZOFRAN-ODT) 8 MG disintegrating tablet; Take 1 tablet (8 mg total) by mouth every 6 (six) hours as needed for nausea or vomiting.   I am having Ms. Rothert start on oseltamivir, guaiFENesin-codeine, and ondansetron. I am also having her maintain her cholecalciferol, fish oil-omega-3 fatty acids, Calcium-Vitamin D-Vitamin K (CALCIUM + D + K PO), Lysine, RESTASIS, Melatonin, aspirin, Aspirin-Caffeine, Multiple Vitamins-Minerals (SM MULTIPLE VITAMINS WOMENS  PO), fluticasone, chlorpheniramine, raloxifene, rosuvastatin, ranitidine, losartan, hydrochlorothiazide, budesonide-formoterol, albuterol, valACYclovir, and traZODone.  Meds ordered this encounter  Medications  . oseltamivir (TAMIFLU) 75 MG capsule    Sig: Take 1 capsule (75 mg total) by mouth 2 (two) times daily.    Dispense:  10 capsule    Refill:  0  . guaiFENesin-codeine (CHERATUSSIN AC) 100-10 MG/5ML syrup    Sig: Take 5 mLs by mouth every 4 (four) hours as needed for cough.    Dispense:  180 mL    Refill:  0  . ondansetron (ZOFRAN-ODT) 8 MG disintegrating tablet    Sig: Take 1 tablet (8 mg total) by mouth every 6 (six) hours as needed for nausea or vomiting.    Dispense:  20 tablet    Refill:  1     Follow-up: Return if symptoms worsen or fail to improve.  Claretta Fraise, M.D.

## 2017-01-20 ENCOUNTER — Ambulatory Visit (INDEPENDENT_AMBULATORY_CARE_PROVIDER_SITE_OTHER): Payer: Medicare Other | Admitting: Family Medicine

## 2017-01-20 ENCOUNTER — Encounter: Payer: Self-pay | Admitting: Family Medicine

## 2017-01-20 VITALS — BP 152/85 | HR 65 | Temp 99.1°F | Ht 61.0 in | Wt 188.0 lb

## 2017-01-20 DIAGNOSIS — E559 Vitamin D deficiency, unspecified: Secondary | ICD-10-CM

## 2017-01-20 DIAGNOSIS — I10 Essential (primary) hypertension: Secondary | ICD-10-CM | POA: Diagnosis not present

## 2017-01-20 DIAGNOSIS — E782 Mixed hyperlipidemia: Secondary | ICD-10-CM

## 2017-01-20 NOTE — Progress Notes (Signed)
Subjective:  Patient ID: Renee Maldonado, female    DOB: 06/05/1946  Age: 71 y.o. MRN: [id number]  CC: Hyperlipidemia (pt here today for routine follow up on her cholesterol)   HPI Renee Maldonado presents for  follow-up of hypertension. Patient has no history of headache chest pain or shortness of breath or recent cough. Patient also denies symptoms of TIA such as numbness weakness lateralizing. Patient has a blood pressure monitor at home but hasn't been using it recently. Patient denies side effects from medication. States taking it regularly.  Patient also  in for follow-up of elevated cholesterol. Doing well  on current medication.She feels weak all over at times. She says her legs feel like she will fall. She is doing strengthening bowing balance exercises regularly. She goes to the community center and does tai chi and yoga. This seems to be helping some. She is concerned about the Crestor as the cause and decided to go to every other day on the medication. Denies side effects of statin including myalgia and arthralgia and nausea. Also in today for liver function testing. Currently no chest pain, shortness of breath or other cardiovascular related symptoms noted.   History Renee Maldonado has a past medical history of Anxiety; GERD (gastroesophageal reflux disease); Humerus fracture (2007); Hyperlipidemia; Hypertension; and Insomnia.   She has a past surgical history that includes Total knee arthroplasty; Left shoulder surgery; Joint replacement; and Tubal ligation.   Her family history includes Alzheimer's disease in her mother; Cancer in her brother; Diabetes in her mother; Hypertension in her father; Stroke in her father.She reports that she quit smoking about 2 years ago. Her smoking use included Cigarettes. She has a 20.00 pack-year smoking history. She has never used smokeless tobacco. She reports that she drinks alcohol. She reports that she does not use drugs.  Current Outpatient Prescriptions  on File Prior to Visit  Medication Sig Dispense Refill  . albuterol (PROVENTIL HFA;VENTOLIN HFA) 108 (90 Base) MCG/ACT inhaler Inhale 2 puffs into the lungs every 6 (six) hours as needed for wheezing or shortness of breath. 1 Inhaler 0  . aspirin 81 MG tablet Take 81 mg by mouth daily.    . Aspirin-Caffeine (BAYER BACK & BODY PAIN EX ST) 500-32.5 MG TABS Take 1 tablet by mouth daily as needed.    . budesonide-formoterol (SYMBICORT) 80-4.5 MCG/ACT inhaler Inhale 2 puffs into the lungs 2 (two) times daily as needed. 1 Inhaler 3  . Calcium-Vitamin D-Vitamin K (CALCIUM + D + K PO) Take 1 tablet by mouth 2 (two) times daily before a meal.     . chlorpheniramine (CHLOR-TRIMETON) 4 MG tablet Take 1 tablet (4 mg total) by mouth at bedtime as needed for allergies. 30 tablet 0  . cholecalciferol (VITAMIN D) 1000 UNITS tablet Take 1,000 Units by mouth daily.    . fish oil-omega-3 fatty acids 1000 MG capsule Take 1 g by mouth 2 (two) times daily before a meal.    . fluticasone (FLONASE) 50 MCG/ACT nasal spray Place into both nostrils daily.    . hydrochlorothiazide (HYDRODIURIL) 25 MG tablet Take 1 tablet (25 mg total) by mouth daily. 90 tablet 1  . losartan (COZAAR) 100 MG tablet Take 1 tablet (100 mg total) by mouth daily. 90 tablet 1  . Lysine 500 MG TABS Take by mouth 2 (two) times daily before a meal.    . Melatonin 5 MG TABS Take 1 tablet by mouth daily.    . Multiple Vitamins-Minerals (SM MULTIPLE VITAMINS WOMENS  PO) Take 1 tablet by mouth daily.    . ondansetron (ZOFRAN-ODT) 8 MG disintegrating tablet Take 1 tablet (8 mg total) by mouth every 6 (six) hours as needed for nausea or vomiting. 20 tablet 1  . raloxifene (EVISTA) 60 MG tablet Take 1 tablet (60 mg total) by mouth daily. 90 tablet 1  . ranitidine (ZANTAC) 150 MG tablet Take 1 tablet (150 mg total) by mouth 2 (two) times daily. 180 tablet 1  . RESTASIS 0.05 % ophthalmic emulsion Place 1 drop into both eyes 2 (two) times daily as needed.     .  rosuvastatin (CRESTOR) 20 MG tablet Take 1 tablet (20 mg total) by mouth daily. 90 tablet 1  . traZODone (DESYREL) 50 MG tablet TAKE ONE TABLET AT BEDTIME 90 tablet 0  . valACYclovir (VALTREX) 500 MG tablet Take 1 tablet (500 mg total) by mouth daily. 30 tablet 11   No current facility-administered medications on file prior to visit.     ROS Review of Systems  Constitutional: Negative for activity change, appetite change and fever.  HENT: Negative for congestion, rhinorrhea and sore throat.   Eyes: Negative for visual disturbance.  Respiratory: Negative for cough and shortness of breath.   Cardiovascular: Negative for chest pain and palpitations.  Gastrointestinal: Negative for abdominal pain, diarrhea and nausea.  Genitourinary: Negative for dysuria.  Musculoskeletal: Negative for arthralgias and myalgias.    Objective:  BP (!) 152/85   Pulse 65   Temp 99.1 F (37.3 C) (Oral)   Ht _0  (1.549 m)   Wt 188 lb (85.3 kg)   BMI 35.52 kg/m   BP Readings from Last 3 Encounters:  01/20/17 (!) 152/85  12/31/16 130/73  10/22/16 130/81    Wt Readings from Last 3 Encounters:  01/20/17 188 lb (85.3 kg)  12/31/16 194 lb (88 kg)  10/22/16 195 lb (88.5 kg)     Physical Exam  Constitutional: She is oriented to person, place, and time. She appears well-developed and well-nourished. No distress.  HENT:  Head: Normocephalic and atraumatic.  Right Ear: External ear normal.  Left Ear: External ear normal.  Nose: Nose normal.  Mouth/Throat: Oropharynx is clear and moist.  Eyes: Conjunctivae and EOM are normal. Pupils are equal, round, and reactive to light.  Neck: Normal range of motion. Neck supple. No thyromegaly present.  Cardiovascular: Normal rate, regular rhythm and normal heart sounds.   No murmur heard. Pulmonary/Chest: Effort normal and breath sounds normal. No respiratory distress. She has no wheezes. She has no rales.  Abdominal: Soft. Bowel sounds are normal. She  exhibits no distension. There is no tenderness.  Lymphadenopathy:    She has no cervical adenopathy.  Neurological: She is alert and oriented to person, place, and time. She has normal reflexes.  Skin: Skin is warm and dry.  Psychiatric: She has a normal mood and affect. Her behavior is normal. Judgment and thought content normal.     Assessment & Plan:   Renee Maldonado was seen today for hyperlipidemia.  Diagnoses and all orders for this visit:  Mixed hyperlipidemia -     CBC with Differential/Platelet -     CMP14+EGFR -     Lipid panel  Essential hypertension -     CBC with Differential/Platelet -     CMP14+EGFR  Vitamin D deficiency -     CBC with Differential/Platelet -     CMP14+EGFR -     VITAMIN D 25 Hydroxy (Vit-D Deficiency, Fractures)   I have  discontinued Ms. Thibodaux's oseltamivir and guaiFENesin-codeine. I am also having her maintain her cholecalciferol, fish oil-omega-3 fatty acids, Calcium-Vitamin D-Vitamin K (CALCIUM + D + K PO), Lysine, RESTASIS, Melatonin, aspirin, Aspirin-Caffeine, Multiple Vitamins-Minerals (SM MULTIPLE VITAMINS WOMENS PO), fluticasone, chlorpheniramine, raloxifene, rosuvastatin, ranitidine, losartan, hydrochlorothiazide, budesonide-formoterol, albuterol, valACYclovir, traZODone, and ondansetron.  No orders of the defined types were placed in this encounter.  Due 2 week trial off of the Crestor. If the weakness goes away will have our culprit. If the weakness does not go away she'll need to resume the Crestor. We should also look for sources for her weakness and falling.   I advised her to take her blood pressure at home 3 times in the morning and 3 times in the evening daily for a week. This should tell us accurately if her monitor is sound if she has white coat syndrome.  Follow-up: Return in about 6 months (around 07/23/2017).  Claretta Fraise, M.D.

## 2017-01-21 LAB — CMP14+EGFR
A/G RATIO: 2 (ref 1.2–2.2)
ALBUMIN: 4.5 g/dL (ref 3.5–4.8)
ALK PHOS: 85 IU/L (ref 39–117)
ALT: 15 IU/L (ref 0–32)
AST: 17 IU/L (ref 0–40)
BUN / CREAT RATIO: 19 (ref 12–28)
BUN: 19 mg/dL (ref 8–27)
Bilirubin Total: 0.4 mg/dL (ref 0.0–1.2)
CO2: 27 mmol/L (ref 18–29)
CREATININE: 1 mg/dL (ref 0.57–1.00)
Calcium: 9.8 mg/dL (ref 8.7–10.3)
Chloride: 100 mmol/L (ref 96–106)
GFR calc Af Amer: 65 mL/min/{1.73_m2} (ref >59)
GFR calc non Af Amer: 57 mL/min/{1.73_m2} — ABNORMAL LOW (ref >59)
GLOBULIN, TOTAL: 2.3 g/dL (ref 1.5–4.5)
Glucose: 107 mg/dL — ABNORMAL HIGH (ref 65–99)
POTASSIUM: 4.7 mmol/L (ref 3.5–5.2)
Sodium: 141 mmol/L (ref 134–144)
Total Protein: 6.8 g/dL (ref 6.0–8.5)

## 2017-01-21 LAB — LIPID PANEL
CHOL/HDL RATIO: 4.9 ratio — AB (ref 0.0–4.4)
CHOLESTEROL TOTAL: 204 mg/dL — AB (ref 100–199)
HDL: 42 mg/dL (ref >39)
LDL CALC: 136 mg/dL — AB (ref 0–99)
Triglycerides: 131 mg/dL (ref 0–149)
VLDL Cholesterol Cal: 26 mg/dL (ref 5–40)

## 2017-01-21 LAB — CBC WITH DIFFERENTIAL/PLATELET
BASOS: 0 %
Basophils Absolute: 0 10*3/uL (ref 0.0–0.2)
EOS (ABSOLUTE): 0.1 10*3/uL (ref 0.0–0.4)
EOS: 3 %
HEMATOCRIT: 37.4 % (ref 34.0–46.6)
Hemoglobin: 12.2 g/dL (ref 11.1–15.9)
Immature Grans (Abs): 0 10*3/uL (ref 0.0–0.1)
Immature Granulocytes: 0 %
LYMPHS ABS: 1.2 10*3/uL (ref 0.7–3.1)
Lymphs: 35 %
MCH: 27.2 pg (ref 26.6–33.0)
MCHC: 32.6 g/dL (ref 31.5–35.7)
MCV: 84 fL (ref 79–97)
MONOCYTES: 9 %
MONOS ABS: 0.3 10*3/uL (ref 0.1–0.9)
NEUTROS ABS: 1.8 10*3/uL (ref 1.4–7.0)
Neutrophils: 53 %
Platelets: 305 10*3/uL (ref 150–379)
RBC: 4.48 x10E6/uL (ref 3.77–5.28)
RDW: 14.2 % (ref 12.3–15.4)
WBC: 3.4 10*3/uL (ref 3.4–10.8)

## 2017-01-21 LAB — VITAMIN D 25 HYDROXY (VIT D DEFICIENCY, FRACTURES): VIT D 25 HYDROXY: 33.1 ng/mL (ref 30.0–100.0)

## 2017-01-22 ENCOUNTER — Encounter: Payer: Self-pay | Admitting: Family Medicine

## 2017-01-26 ENCOUNTER — Telehealth: Payer: Self-pay | Admitting: Family Medicine

## 2017-01-26 NOTE — Telephone Encounter (Signed)
Pt has been taking BP readings at home. Not sure if she is getting accurate readings or not. There are times when the cuff pinches her and the readings are very high in the 150s then she loosens the cuff a lot and her readings a in normal range. She will bring her cuff & readings in to see the triage nurse on thursday

## 2017-01-29 ENCOUNTER — Encounter: Payer: Medicare Other | Admitting: *Deleted

## 2017-01-30 ENCOUNTER — Ambulatory Visit (INDEPENDENT_AMBULATORY_CARE_PROVIDER_SITE_OTHER): Payer: Medicare Other | Admitting: *Deleted

## 2017-01-30 VITALS — BP 172/86 | HR 84

## 2017-01-30 DIAGNOSIS — Z0130 Encounter for examination of blood pressure without abnormal findings: Secondary | ICD-10-CM

## 2017-02-09 ENCOUNTER — Other Ambulatory Visit: Payer: Self-pay | Admitting: Family Medicine

## 2017-02-09 DIAGNOSIS — G4700 Insomnia, unspecified: Secondary | ICD-10-CM

## 2017-02-16 ENCOUNTER — Other Ambulatory Visit: Payer: Self-pay | Admitting: Family Medicine

## 2017-02-16 DIAGNOSIS — G4700 Insomnia, unspecified: Secondary | ICD-10-CM

## 2017-03-04 ENCOUNTER — Telehealth: Payer: Self-pay | Admitting: Family Medicine

## 2017-03-04 NOTE — Telephone Encounter (Signed)
Left message for patient to call back regarding Tetanus vaccine.  Patient is not due for vaccine until 2024

## 2017-03-10 NOTE — Telephone Encounter (Signed)
Aware of when next Tdap vaccine is due

## 2017-05-25 ENCOUNTER — Other Ambulatory Visit: Payer: Self-pay | Admitting: Family Medicine

## 2017-05-25 DIAGNOSIS — K219 Gastro-esophageal reflux disease without esophagitis: Secondary | ICD-10-CM

## 2017-05-25 DIAGNOSIS — M8580 Other specified disorders of bone density and structure, unspecified site: Secondary | ICD-10-CM

## 2017-07-23 ENCOUNTER — Encounter: Payer: Self-pay | Admitting: Family Medicine

## 2017-07-23 ENCOUNTER — Ambulatory Visit (INDEPENDENT_AMBULATORY_CARE_PROVIDER_SITE_OTHER): Payer: Medicare Other | Admitting: Family Medicine

## 2017-07-23 VITALS — BP 120/70 | HR 64 | Temp 97.4°F | Ht 61.0 in | Wt 199.0 lb

## 2017-07-23 DIAGNOSIS — M8580 Other specified disorders of bone density and structure, unspecified site: Secondary | ICD-10-CM | POA: Diagnosis not present

## 2017-07-23 DIAGNOSIS — E785 Hyperlipidemia, unspecified: Secondary | ICD-10-CM | POA: Diagnosis not present

## 2017-07-23 DIAGNOSIS — G4709 Other insomnia: Secondary | ICD-10-CM | POA: Diagnosis not present

## 2017-07-23 DIAGNOSIS — I10 Essential (primary) hypertension: Secondary | ICD-10-CM

## 2017-07-23 DIAGNOSIS — K219 Gastro-esophageal reflux disease without esophagitis: Secondary | ICD-10-CM

## 2017-07-23 MED ORDER — ROSUVASTATIN CALCIUM 20 MG PO TABS
20.0000 mg | ORAL_TABLET | Freq: Every day | ORAL | 1 refills | Status: DC
Start: 2017-07-23 — End: 2018-02-08

## 2017-07-23 MED ORDER — TRAZODONE HCL 50 MG PO TABS
50.0000 mg | ORAL_TABLET | Freq: Every day | ORAL | 1 refills | Status: DC
Start: 2017-07-23 — End: 2018-02-08

## 2017-07-23 MED ORDER — RANITIDINE HCL 150 MG PO TABS: 150.0000 mg | ORAL_TABLET | Freq: Two times a day (BID) | ORAL | 1 refills | Status: DC

## 2017-07-23 MED ORDER — CHLORPHENIRAMINE MALEATE 4 MG PO TABS: 4.0000 mg | ORAL_TABLET | Freq: Every evening | ORAL | 0 refills | Status: DC | PRN

## 2017-07-23 MED ORDER — LOSARTAN POTASSIUM 100 MG PO TABS: 100.0000 mg | ORAL_TABLET | Freq: Every day | ORAL | 1 refills | Status: DC

## 2017-07-23 MED ORDER — HYDROCHLOROTHIAZIDE 25 MG PO TABS: 25.0000 mg | ORAL_TABLET | Freq: Every day | ORAL | 1 refills | Status: DC

## 2017-07-23 MED ORDER — RALOXIFENE HCL 60 MG PO TABS
60.0000 mg | ORAL_TABLET | Freq: Every day | ORAL | 1 refills | Status: DC
Start: 2017-07-23 — End: 2018-02-08

## 2017-07-23 NOTE — Progress Notes (Signed)
Subjective:  Patient ID: Renee Maldonado, female    DOB: 09-11-1946  Age: 71 y.o. MRN: [id number]  CC: Hypertension (pt here today for routine follow up of her chronic medical conditions, no other concerns voiced.)   HPI Renee Maldonado presents for  follow-up of hypertension. Patient has no history of headache chest pain or shortness of breath or recent cough. Patient also denies symptoms of TIA such as numbness weakness lateralizing. Patient checks  blood pressure at home and has not had any elevated readings recently. Patient denies side effects from his medication. States taking it regularly. Patient in for follow-up of elevated cholesterol. Doing well without complaints on current medication. Denies side effects of statin including myalgia and arthralgia and nausea. Also in today for liver function testing. Currently no chest pain, shortness of breath or other cardiovascular related symptoms noted. Patient in for follow-up of GERD. Currently asymptomatic taking  PPI daily. There is no chest pain or heartburn. No hematemesis and no melena. No dysphagia or choking. Onset is remote. Progression is stable. Complicating factors, none.   Depression screen [facility] 2/9 07/23/2017 01/20/2017 12/31/2016  Decreased Interest 0 0 0  Down, Depressed, Hopeless 0 0 0  PHQ - 2 Score 0 0 0    History Jayleah has a past medical history of Anxiety; GERD (gastroesophageal reflux disease); Humerus fracture (2007); Hyperlipidemia; Hypertension; and Insomnia.   She has a past surgical history that includes Total knee arthroplasty; Left shoulder surgery; Joint replacement; and Tubal ligation.   Her family history includes Alzheimer's disease in her mother; Cancer in her brother; Diabetes in her mother; Hypertension in her father; Stroke in her father.She reports that she quit smoking about 2 years ago. Her smoking use included Cigarettes. She has a 20.00 pack-year smoking history. She has never used smokeless tobacco. She  reports that she drinks alcohol. She reports that she does not use drugs.    ROS Review of Systems  Constitutional: Negative for activity change, appetite change and fever.  HENT: Negative for congestion, rhinorrhea and sore throat.   Eyes: Negative for visual disturbance.  Respiratory: Negative for cough and shortness of breath.   Cardiovascular: Negative for chest pain and palpitations.  Gastrointestinal: Negative for abdominal pain, diarrhea and nausea.  Genitourinary: Negative for dysuria.  Musculoskeletal: Positive for arthralgias (Knees and hips. Worse with exertion). Negative for myalgias.    Objective:  BP 120/70   Pulse 64   Temp (!) 97.4 F (36.3 C) (Oral)   Ht '5\' 1"'  (1.549 m)   Wt 199 lb (90.3 kg)   BMI 37.60 kg/m   BP Readings from Last 3 Encounters:  07/23/17 120/70  01/30/17 (!) 172/86  01/20/17 (!) 152/85    Wt Readings from Last 3 Encounters:  07/23/17 199 lb (90.3 kg)  01/20/17 188 lb (85.3 kg)  12/31/16 194 lb (88 kg)     Physical Exam  Constitutional: She is oriented to person, place, and time. She appears well-developed and well-nourished. No distress.  HENT:  Head: Normocephalic and atraumatic.  Right Ear: External ear normal.  Left Ear: External ear normal.  Nose: Nose normal.  Mouth/Throat: Oropharynx is clear and moist.  Eyes: Pupils are equal, round, and reactive to light. Conjunctivae and EOM are normal.  Neck: Normal range of motion. Neck supple. No thyromegaly present.  Cardiovascular: Normal rate, regular rhythm and normal heart sounds.   No murmur heard. Pulmonary/Chest: Effort normal and breath sounds normal. No respiratory distress. She has no wheezes. She has  no rales.  Abdominal: Soft. Bowel sounds are normal. She exhibits no distension. There is no tenderness.  Lymphadenopathy:    She has no cervical adenopathy.  Neurological: She is alert and oriented to person, place, and time. She has normal reflexes.  Skin: Skin is warm  and dry.  Psychiatric: She has a normal mood and affect. Her behavior is normal. Judgment and thought content normal.      Assessment & Plan:   Renee Maldonado was seen today for hypertension.  Diagnoses and all orders for this visit:  Essential hypertension -     CBC with Differential/Platelet -     CMP14+EGFR -     hydrochlorothiazide (HYDRODIURIL) 25 MG tablet; Take 1 tablet (25 mg total) by mouth daily. -     losartan (COZAAR) 100 MG tablet; Take 1 tablet (100 mg total) by mouth daily.  Gastroesophageal reflux disease without esophagitis -     ranitidine (ZANTAC) 150 MG tablet; Take 1 tablet (150 mg total) by mouth 2 (two) times daily.  Hyperlipidemia, unspecified hyperlipidemia type -     Lipid panel -     rosuvastatin (CRESTOR) 20 MG tablet; Take 1 tablet (20 mg total) by mouth daily.  Other insomnia -     traZODone (DESYREL) 50 MG tablet; Take 1 tablet (50 mg total) by mouth at bedtime.  Osteopenia, unspecified location -     raloxifene (EVISTA) 60 MG tablet; Take 1 tablet (60 mg total) by mouth daily.  Other orders -     chlorpheniramine (CHLOR-TRIMETON) 4 MG tablet; Take 1 tablet (4 mg total) by mouth at bedtime as needed for allergies.       I have discontinued Ms. Tolbert's Multiple Vitamins-Minerals (SM MULTIPLE VITAMINS WOMENS PO). I have also changed her traZODone. Additionally, I am having her maintain her cholecalciferol, fish oil-omega-3 fatty acids, Calcium-Vitamin D-Vitamin K (CALCIUM + D + K PO), Lysine, RESTASIS, Melatonin, aspirin, Aspirin-Caffeine, fluticasone, budesonide-formoterol, albuterol, valACYclovir, ondansetron, hydrochlorothiazide, losartan, ranitidine, rosuvastatin, raloxifene, and chlorpheniramine.  Allergies as of 07/23/2017      Reactions   Benadryl [diphenhydramine Hcl]    Penicillins    Had a rash around 20 years ago but unsure if it come from medicine.       Medication List       Accurate as of 07/23/17  9:47 AM. Always use your most recent  med list.          albuterol 108 (90 Base) MCG/ACT inhaler Commonly known as:  PROVENTIL HFA;VENTOLIN HFA Inhale 2 puffs into the lungs every 6 (six) hours as needed for wheezing or shortness of breath.   aspirin 81 MG tablet Take 81 mg by mouth daily.   BAYER BACK & BODY PAIN EX ST 500-32.5 MG Tabs Generic drug:  Aspirin-Caffeine Take 1 tablet by mouth daily as needed.   budesonide-formoterol 80-4.5 MCG/ACT inhaler Commonly known as:  SYMBICORT Inhale 2 puffs into the lungs 2 (two) times daily as needed.   CALCIUM + D + K PO Take 1 tablet by mouth 2 (two) times daily before a meal.   chlorpheniramine 4 MG tablet Commonly known as:  CHLOR-TRIMETON Take 1 tablet (4 mg total) by mouth at bedtime as needed for allergies.   cholecalciferol 1000 units tablet Commonly known as:  VITAMIN D Take 1,000 Units by mouth daily.   fish oil-omega-3 fatty acids 1000 MG capsule Take 1 g by mouth 2 (two) times daily before a meal.   fluticasone 50 MCG/ACT nasal spray Commonly known  as:  FLONASE Place into both nostrils daily.   hydrochlorothiazide 25 MG tablet Commonly known as:  HYDRODIURIL Take 1 tablet (25 mg total) by mouth daily.   losartan 100 MG tablet Commonly known as:  COZAAR Take 1 tablet (100 mg total) by mouth daily.   Lysine 500 MG Tabs Take by mouth 2 (two) times daily before a meal.   Melatonin 5 MG Tabs Take 1 tablet by mouth daily.   ondansetron 8 MG disintegrating tablet Commonly known as:  ZOFRAN-ODT Take 1 tablet (8 mg total) by mouth every 6 (six) hours as needed for nausea or vomiting.   raloxifene 60 MG tablet Commonly known as:  EVISTA Take 1 tablet (60 mg total) by mouth daily.   ranitidine 150 MG tablet Commonly known as:  ZANTAC Take 1 tablet (150 mg total) by mouth 2 (two) times daily.   RESTASIS 0.05 % ophthalmic emulsion Generic drug:  cycloSPORINE Place 1 drop into both eyes 2 (two) times daily as needed.   rosuvastatin 20 MG  tablet Commonly known as:  CRESTOR Take 1 tablet (20 mg total) by mouth daily.   traZODone 50 MG tablet Commonly known as:  DESYREL Take 1 tablet (50 mg total) by mouth at bedtime.   valACYclovir 500 MG tablet Commonly known as:  VALTREX Take 1 tablet (500 mg total) by mouth daily.            Discharge Care Instructions        Start     Ordered   07/23/17 0000  CBC with Differential/Platelet     07/23/17 0909   07/23/17 0000  CMP14+EGFR     07/23/17 0909   07/23/17 0000  Lipid panel     07/23/17 0909   07/23/17 0000  hydrochlorothiazide (HYDRODIURIL) 25 MG tablet  Daily     07/23/17 0909   07/23/17 0000  losartan (COZAAR) 100 MG tablet  Daily    Comments:  Separating so patient can hold diuretic if needed   07/23/17 0909   07/23/17 0000  ranitidine (ZANTAC) 150 MG tablet  2 times daily     07/23/17 0909   07/23/17 0000  rosuvastatin (CRESTOR) 20 MG tablet  Daily     07/23/17 0909   07/23/17 0000  traZODone (DESYREL) 50 MG tablet  Daily at bedtime     07/23/17 0909   07/23/17 0000  raloxifene (EVISTA) 60 MG tablet  Daily     07/23/17 0909   07/23/17 0000  chlorpheniramine (CHLOR-TRIMETON) 4 MG tablet  At bedtime PRN     07/23/17 0909       Follow-up: Return in about 6 months (around 01/20/2018).  Claretta Fraise, M.D.

## 2017-07-24 LAB — CBC WITH DIFFERENTIAL/PLATELET
BASOS ABS: 0 10*3/uL (ref 0.0–0.2)
Basos: 0 %
EOS (ABSOLUTE): 0.2 10*3/uL (ref 0.0–0.4)
Eos: 3 %
Hematocrit: 38.5 % (ref 34.0–46.6)
Hemoglobin: 12 g/dL (ref 11.1–15.9)
Immature Grans (Abs): 0 10*3/uL (ref 0.0–0.1)
Immature Granulocytes: 0 %
LYMPHS ABS: 1.6 10*3/uL (ref 0.7–3.1)
Lymphs: 34 %
MCH: 26.8 pg (ref 26.6–33.0)
MCHC: 31.2 g/dL — AB (ref 31.5–35.7)
MCV: 86 fL (ref 79–97)
MONOS ABS: 0.5 10*3/uL (ref 0.1–0.9)
Monocytes: 10 %
NEUTROS ABS: 2.4 10*3/uL (ref 1.4–7.0)
Neutrophils: 53 %
PLATELETS: 242 10*3/uL (ref 150–379)
RBC: 4.48 x10E6/uL (ref 3.77–5.28)
RDW: 13.6 % (ref 12.3–15.4)
WBC: 4.6 10*3/uL (ref 3.4–10.8)

## 2017-07-24 LAB — CMP14+EGFR
ALBUMIN: 4.3 g/dL (ref 3.5–4.8)
ALK PHOS: 81 IU/L (ref 39–117)
ALT: 13 IU/L (ref 0–32)
AST: 18 IU/L (ref 0–40)
Albumin/Globulin Ratio: 2 (ref 1.2–2.2)
BUN / CREAT RATIO: 21 (ref 12–28)
BUN: 22 mg/dL (ref 8–27)
Bilirubin Total: 0.3 mg/dL (ref 0.0–1.2)
CO2: 26 mmol/L (ref 20–29)
CREATININE: 1.07 mg/dL — AB (ref 0.57–1.00)
Calcium: 9.7 mg/dL (ref 8.7–10.3)
Chloride: 101 mmol/L (ref 96–106)
GFR, EST AFRICAN AMERICAN: 60 mL/min/{1.73_m2} (ref >59)
GFR, EST NON AFRICAN AMERICAN: 52 mL/min/{1.73_m2} — AB (ref >59)
GLOBULIN, TOTAL: 2.2 g/dL (ref 1.5–4.5)
Glucose: 101 mg/dL — ABNORMAL HIGH (ref 65–99)
Potassium: 5.1 mmol/L (ref 3.5–5.2)
SODIUM: 142 mmol/L (ref 134–144)
TOTAL PROTEIN: 6.5 g/dL (ref 6.0–8.5)

## 2017-07-24 LAB — LIPID PANEL
CHOL/HDL RATIO: 3.3 ratio (ref 0.0–4.4)
CHOLESTEROL TOTAL: 140 mg/dL (ref 100–199)
HDL: 43 mg/dL (ref >39)
LDL CALC: 72 mg/dL (ref 0–99)
Triglycerides: 124 mg/dL (ref 0–149)
VLDL Cholesterol Cal: 25 mg/dL (ref 5–40)

## 2018-01-20 ENCOUNTER — Encounter: Payer: Medicare Other | Admitting: Family Medicine

## 2018-02-08 ENCOUNTER — Encounter: Payer: Self-pay | Admitting: Family Medicine

## 2018-02-08 ENCOUNTER — Ambulatory Visit (INDEPENDENT_AMBULATORY_CARE_PROVIDER_SITE_OTHER): Payer: Medicare Other

## 2018-02-08 ENCOUNTER — Ambulatory Visit (INDEPENDENT_AMBULATORY_CARE_PROVIDER_SITE_OTHER): Payer: Medicare Other | Admitting: Family Medicine

## 2018-02-08 VITALS — BP 158/78 | HR 62 | Temp 97.7°F | Ht 61.0 in | Wt 200.0 lb

## 2018-02-08 DIAGNOSIS — K219 Gastro-esophageal reflux disease without esophagitis: Secondary | ICD-10-CM

## 2018-02-08 DIAGNOSIS — M8580 Other specified disorders of bone density and structure, unspecified site: Secondary | ICD-10-CM

## 2018-02-08 DIAGNOSIS — Z0000 Encounter for general adult medical examination without abnormal findings: Secondary | ICD-10-CM

## 2018-02-08 DIAGNOSIS — E785 Hyperlipidemia, unspecified: Secondary | ICD-10-CM

## 2018-02-08 DIAGNOSIS — R5383 Other fatigue: Secondary | ICD-10-CM

## 2018-02-08 DIAGNOSIS — G4709 Other insomnia: Secondary | ICD-10-CM

## 2018-02-08 DIAGNOSIS — M8588 Other specified disorders of bone density and structure, other site: Secondary | ICD-10-CM | POA: Diagnosis not present

## 2018-02-08 DIAGNOSIS — E559 Vitamin D deficiency, unspecified: Secondary | ICD-10-CM

## 2018-02-08 DIAGNOSIS — I10 Essential (primary) hypertension: Secondary | ICD-10-CM

## 2018-02-08 LAB — URINALYSIS
Bilirubin, UA: NEGATIVE
Glucose, UA: NEGATIVE
Ketones, UA: NEGATIVE
Nitrite, UA: NEGATIVE
Protein, UA: NEGATIVE
RBC, UA: NEGATIVE
Specific Gravity, UA: 1.02 (ref 1.005–1.030)
Urobilinogen, Ur: 0.2 mg/dL (ref 0.2–1.0)
pH, UA: 5.5 (ref 5.0–7.5)

## 2018-02-08 MED ORDER — FLUTICASONE FUROATE-VILANTEROL 100-25 MCG/INH IN AEPB: 1.0000 | INHALATION_SPRAY | Freq: Every day | RESPIRATORY_TRACT | 1 refills | Status: DC

## 2018-02-08 MED ORDER — AMLODIPINE BESYLATE 5 MG PO TABS: 5.0000 mg | ORAL_TABLET | Freq: Every day | ORAL | 3 refills | Status: DC

## 2018-02-08 MED ORDER — ROSUVASTATIN CALCIUM 20 MG PO TABS: 20.0000 mg | ORAL_TABLET | Freq: Every day | ORAL | 1 refills | Status: DC

## 2018-02-08 MED ORDER — HYDROCHLOROTHIAZIDE 25 MG PO TABS: 25.0000 mg | ORAL_TABLET | Freq: Every day | ORAL | 1 refills | Status: DC

## 2018-02-08 MED ORDER — ALBUTEROL SULFATE HFA 108 (90 BASE) MCG/ACT IN AERS: 2.0000 | INHALATION_SPRAY | Freq: Four times a day (QID) | RESPIRATORY_TRACT | 3 refills | Status: DC | PRN

## 2018-02-08 MED ORDER — VALACYCLOVIR HCL 500 MG PO TABS: 500.0000 mg | ORAL_TABLET | Freq: Every day | ORAL | 11 refills | Status: DC

## 2018-02-08 MED ORDER — RANITIDINE HCL 150 MG PO TABS: 150.0000 mg | ORAL_TABLET | Freq: Two times a day (BID) | ORAL | 1 refills | Status: DC

## 2018-02-08 MED ORDER — TRAZODONE HCL 50 MG PO TABS: 50.0000 mg | ORAL_TABLET | Freq: Every day | ORAL | 1 refills | Status: DC

## 2018-02-08 MED ORDER — RALOXIFENE HCL 60 MG PO TABS: 60.0000 mg | ORAL_TABLET | Freq: Every day | ORAL | 1 refills | Status: DC

## 2018-02-08 NOTE — Progress Notes (Signed)
Subjective:  Patient ID: Renee Maldonado, female    DOB: 02/07/1946  Age: 72 y.o. MRN: [id number]  CC: Annual Exam   HPI Renee Maldonado presents for annual physical exam.  She is noted to have elevated blood pressure today.  Readings at home tend to run a bit high as well.  Patient in for follow-up of elevated cholesterol. Doing well without complaints on current medication. Denies side effects of statin including myalgia and arthralgia and nausea. Also in today for liver function testing. Currently no chest pain, shortness of breath or other cardiovascular related symptoms noted.  She is having some problems with shortness of breath periodically.  This does not prevent activities but is somewhat limiting.  She is having to use her Proventil inhaler regularly.  She took herself off of Symbicort because it did not seem to be effective.    Depression screen [place] 2/9 02/08/2018 07/23/2017 01/20/2017  Decreased Interest 0 0 0  Down, Depressed, Hopeless 0 0 0  PHQ - 2 Score 0 0 0    History Renee Maldonado has a past medical history of Anxiety, GERD (gastroesophageal reflux disease), Humerus fracture (2007), Hyperlipidemia, Hypertension, and Insomnia.   She has a past surgical history that includes Total knee arthroplasty; Left shoulder surgery; Joint replacement; and Tubal ligation.   Her family history includes Alzheimer's disease in her mother; Cancer in her brother; Diabetes in her mother; Hypertension in her father; Stroke in her father.She reports that she quit smoking about 3 years ago. Her smoking use included cigarettes. She has a 20.00 pack-year smoking history. She has never used smokeless tobacco. She reports that she drinks alcohol. She reports that she does not use drugs.    ROS Review of Systems  Constitutional: Negative for appetite change, chills, diaphoresis, fatigue, fever and unexpected weight change.  HENT: Negative for congestion, ear pain, hearing loss, postnasal drip, rhinorrhea,  sneezing, sore throat and trouble swallowing.   Eyes: Negative for pain.  Respiratory: Negative for cough, chest tightness and shortness of breath.   Cardiovascular: Negative for chest pain and palpitations.  Gastrointestinal: Negative for abdominal pain, constipation, diarrhea, nausea and vomiting.  Endocrine: Negative for cold intolerance, heat intolerance, polydipsia, polyphagia and polyuria.  Genitourinary: Negative for dysuria, frequency and menstrual problem.  Musculoskeletal: Negative for arthralgias and joint swelling.  Skin: Negative for rash.  Allergic/Immunologic: Negative for environmental allergies.  Neurological: Negative for dizziness, weakness, numbness and headaches.  Psychiatric/Behavioral: Negative for agitation and dysphoric mood.    Objective:  BP (!) 158/78   Pulse 62   Temp 97.7 F (36.5 C) (Oral)   Ht '5\' 1"'  (1.549 m)   Wt 200 lb (90.7 kg)   BMI 37.79 kg/m   BP Readings from Last 3 Encounters:  02/08/18 (!) 158/78  07/23/17 120/70  01/30/17 (!) 172/86    Wt Readings from Last 3 Encounters:  02/08/18 200 lb (90.7 kg)  07/23/17 199 lb (90.3 kg)  01/20/17 188 lb (85.3 kg)     Physical Exam  Constitutional: She is oriented to person, place, and time. She appears well-developed and well-nourished. No distress.  HENT:  Head: Normocephalic and atraumatic.  Right Ear: External ear normal.  Left Ear: External ear normal.  Nose: Nose normal.  Mouth/Throat: Oropharynx is clear and moist.  Eyes: Pupils are equal, round, and reactive to light. Conjunctivae and EOM are normal.  Neck: Normal range of motion. Neck supple. No thyromegaly present.  Cardiovascular: Normal rate, regular rhythm and normal heart sounds.  No  murmur heard. Pulmonary/Chest: Effort normal and breath sounds normal. No respiratory distress. She has no wheezes. She has no rales. Right breast exhibits no inverted nipple, no mass and no tenderness. Left breast exhibits no inverted nipple, no  mass and no tenderness. Breasts are symmetrical.  Abdominal: Soft. Normal appearance and bowel sounds are normal. She exhibits no distension, no abdominal bruit and no mass. There is no splenomegaly or hepatomegaly. There is no tenderness. There is no tenderness at McBurney's point and negative Murphy's sign.  Musculoskeletal: Normal range of motion. She exhibits no edema or tenderness.  Lymphadenopathy:    She has no cervical adenopathy.  Neurological: She is alert and oriented to person, place, and time. She has normal reflexes.  Skin: Skin is warm and dry. No rash noted.  Psychiatric: She has a normal mood and affect. Her behavior is normal. Judgment and thought content normal.      Assessment & Plan:   Derry was seen today for annual exam.  Diagnoses and all orders for this visit:  Vitamin D deficiency -     VITAMIN D 25 Hydroxy (Vit-D Deficiency, Fractures)  Essential hypertension -     CBC with Differential/Platelet -     CMP14+EGFR -     Urinalysis -     hydrochlorothiazide (HYDRODIURIL) 25 MG tablet; Take 1 tablet (25 mg total) by mouth daily.  Gastroesophageal reflux disease without esophagitis -     ranitidine (ZANTAC) 150 MG tablet; Take 1 tablet (150 mg total) by mouth 2 (two) times daily.  Hyperlipidemia, unspecified hyperlipidemia type -     Lipid panel -     rosuvastatin (CRESTOR) 20 MG tablet; Take 1 tablet (20 mg total) by mouth daily.  Osteopenia, unspecified location -     raloxifene (EVISTA) 60 MG tablet; Take 1 tablet (60 mg total) by mouth daily. -     DG [facility] DEXA  Other insomnia -     traZODone (DESYREL) 50 MG tablet; Take 1 tablet (50 mg total) by mouth at bedtime.  Other fatigue -     TSH  Well adult exam -     CBC with Differential/Platelet -     CMP14+EGFR -     Lipid panel -     TSH -     VITAMIN D 25 Hydroxy (Vit-D Deficiency, Fractures) -     Urinalysis  Other orders -     amLODipine (NORVASC) 5 MG tablet; Take 1 tablet (5 mg total)  by mouth daily. -     fluticasone furoate-vilanterol (BREO ELLIPTA) 100-25 MCG/INH AEPB; Inhale 1 puff into the lungs daily. -     albuterol (PROVENTIL HFA;VENTOLIN HFA) 108 (90 Base) MCG/ACT inhaler; Inhale 2 puffs into the lungs every 6 (six) hours as needed for wheezing or shortness of breath. -     valACYclovir (VALTREX) 500 MG tablet; Take 1 tablet (500 mg total) by mouth daily. As needed       I have discontinued Pier Shuler's budesonide-formoterol, losartan, and chlorpheniramine. I have also changed her valACYclovir. Additionally, I am having her start on amLODipine and fluticasone furoate-vilanterol. Lastly, I am having her maintain her cholecalciferol, fish oil-omega-3 fatty acids, Calcium-Vitamin D-Vitamin K (CALCIUM + D + K PO), Lysine, RESTASIS, Melatonin, aspirin, Aspirin-Caffeine, fluticasone, ondansetron, hydrochlorothiazide, ranitidine, rosuvastatin, raloxifene, traZODone, and albuterol.  Allergies as of 02/08/2018      Reactions   Benadryl [diphenhydramine Hcl]    Penicillins    Had a rash around 20 years ago but  unsure if it come from medicine.       Medication List        Accurate as of 02/08/18  6:49 PM. Always use your most recent med list.          albuterol 108 (90 Base) MCG/ACT inhaler Commonly known as:  PROVENTIL HFA;VENTOLIN HFA Inhale 2 puffs into the lungs every 6 (six) hours as needed for wheezing or shortness of breath.   amLODipine 5 MG tablet Commonly known as:  NORVASC Take 1 tablet (5 mg total) by mouth daily.   aspirin 81 MG tablet Take 81 mg by mouth daily.   BAYER BACK & BODY PAIN EX ST 500-32.5 MG Tabs Generic drug:  Aspirin-Caffeine Take 1 tablet by mouth daily as needed.   CALCIUM + D + K PO Take 1 tablet by mouth 2 (two) times daily before a meal.   cholecalciferol 1000 units tablet Commonly known as:  VITAMIN D Take 1,000 Units by mouth daily.   fish oil-omega-3 fatty acids 1000 MG capsule Take 1 g by mouth 2 (two) times  daily before a meal.   fluticasone 50 MCG/ACT nasal spray Commonly known as:  FLONASE Place into both nostrils daily.   fluticasone furoate-vilanterol 100-25 MCG/INH Aepb Commonly known as:  BREO ELLIPTA Inhale 1 puff into the lungs daily.   hydrochlorothiazide 25 MG tablet Commonly known as:  HYDRODIURIL Take 1 tablet (25 mg total) by mouth daily.   Lysine 500 MG Tabs Take by mouth 2 (two) times daily before a meal.   Melatonin 5 MG Tabs Take 1 tablet by mouth daily.   ondansetron 8 MG disintegrating tablet Commonly known as:  ZOFRAN-ODT Take 1 tablet (8 mg total) by mouth every 6 (six) hours as needed for nausea or vomiting.   raloxifene 60 MG tablet Commonly known as:  EVISTA Take 1 tablet (60 mg total) by mouth daily.   ranitidine 150 MG tablet Commonly known as:  ZANTAC Take 1 tablet (150 mg total) by mouth 2 (two) times daily.   RESTASIS 0.05 % ophthalmic emulsion Generic drug:  cycloSPORINE Place 1 drop into both eyes 2 (two) times daily as needed.   rosuvastatin 20 MG tablet Commonly known as:  CRESTOR Take 1 tablet (20 mg total) by mouth daily.   traZODone 50 MG tablet Commonly known as:  DESYREL Take 1 tablet (50 mg total) by mouth at bedtime.   valACYclovir 500 MG tablet Commonly known as:  VALTREX Take 1 tablet (500 mg total) by mouth daily. As needed        Follow-up: Return in about 6 months (around 08/11/2018).  Claretta Fraise, M.D.

## 2018-02-09 LAB — CMP14+EGFR
ALBUMIN: 4.5 g/dL (ref 3.5–4.8)
ALK PHOS: 69 IU/L (ref 39–117)
ALT: 20 IU/L (ref 0–32)
AST: 22 IU/L (ref 0–40)
Albumin/Globulin Ratio: 2.3 — ABNORMAL HIGH (ref 1.2–2.2)
BILIRUBIN TOTAL: 0.5 mg/dL (ref 0.0–1.2)
BUN / CREAT RATIO: 22 (ref 12–28)
BUN: 23 mg/dL (ref 8–27)
CHLORIDE: 101 mmol/L (ref 96–106)
CO2: 24 mmol/L (ref 20–29)
Calcium: 9.8 mg/dL (ref 8.7–10.3)
Creatinine, Ser: 1.03 mg/dL — ABNORMAL HIGH (ref 0.57–1.00)
GFR calc non Af Amer: 54 mL/min/{1.73_m2} — ABNORMAL LOW (ref >59)
GFR, EST AFRICAN AMERICAN: 63 mL/min/{1.73_m2} (ref >59)
GLOBULIN, TOTAL: 2 g/dL (ref 1.5–4.5)
GLUCOSE: 106 mg/dL — AB (ref 65–99)
POTASSIUM: 4.6 mmol/L (ref 3.5–5.2)
SODIUM: 141 mmol/L (ref 134–144)
TOTAL PROTEIN: 6.5 g/dL (ref 6.0–8.5)

## 2018-02-09 LAB — CBC WITH DIFFERENTIAL/PLATELET
BASOS ABS: 0 10*3/uL (ref 0.0–0.2)
BASOS: 0 %
EOS (ABSOLUTE): 0.1 10*3/uL (ref 0.0–0.4)
Eos: 3 %
HEMOGLOBIN: 12.6 g/dL (ref 11.1–15.9)
Hematocrit: 39.1 % (ref 34.0–46.6)
IMMATURE GRANS (ABS): 0 10*3/uL (ref 0.0–0.1)
Immature Granulocytes: 0 %
LYMPHS ABS: 1.4 10*3/uL (ref 0.7–3.1)
Lymphs: 33 %
MCH: 27.2 pg (ref 26.6–33.0)
MCHC: 32.2 g/dL (ref 31.5–35.7)
MCV: 84 fL (ref 79–97)
MONOCYTES: 9 %
Monocytes Absolute: 0.4 10*3/uL (ref 0.1–0.9)
Neutrophils Absolute: 2.3 10*3/uL (ref 1.4–7.0)
Neutrophils: 55 %
Platelets: 266 10*3/uL (ref 150–379)
RBC: 4.63 x10E6/uL (ref 3.77–5.28)
RDW: 14.6 % (ref 12.3–15.4)
WBC: 4.1 10*3/uL (ref 3.4–10.8)

## 2018-02-09 LAB — LIPID PANEL
CHOL/HDL RATIO: 3.1 ratio (ref 0.0–4.4)
Cholesterol, Total: 129 mg/dL (ref 100–199)
HDL: 42 mg/dL (ref >39)
LDL CALC: 54 mg/dL (ref 0–99)
Triglycerides: 167 mg/dL — ABNORMAL HIGH (ref 0–149)
VLDL CHOLESTEROL CAL: 33 mg/dL (ref 5–40)

## 2018-02-09 LAB — TSH: TSH: 3.6 u[IU]/mL (ref 0.450–4.500)

## 2018-02-09 LAB — VITAMIN D 25 HYDROXY (VIT D DEFICIENCY, FRACTURES): Vit D, 25-Hydroxy: 32.9 ng/mL (ref 30.0–100.0)

## 2018-02-18 ENCOUNTER — Other Ambulatory Visit: Payer: Self-pay | Admitting: Family Medicine

## 2018-02-18 DIAGNOSIS — I10 Essential (primary) hypertension: Secondary | ICD-10-CM

## 2018-03-01 DIAGNOSIS — H2513 Age-related nuclear cataract, bilateral: Secondary | ICD-10-CM | POA: Insufficient documentation

## 2018-03-08 ENCOUNTER — Telehealth: Payer: Self-pay | Admitting: *Deleted

## 2018-03-08 ENCOUNTER — Other Ambulatory Visit: Payer: Self-pay | Admitting: Family Medicine

## 2018-03-08 NOTE — Telephone Encounter (Signed)
Pharmacy and pt aware

## 2018-03-08 NOTE — Telephone Encounter (Signed)
DC losartan

## 2018-03-08 NOTE — Telephone Encounter (Signed)
Pt has gotten pharmacy confused, is she supposed to be on losartan, amlodipine and HCTZ? Last note says to discontinue losartin, but you have since reordered it

## 2018-07-22 ENCOUNTER — Ambulatory Visit: Payer: Medicare Other | Admitting: Family

## 2018-07-22 ENCOUNTER — Encounter: Payer: Self-pay | Admitting: Family

## 2018-07-22 VITALS — BP 158/87 | HR 67 | Temp 97.7°F | Ht 61.0 in | Wt 201.8 lb

## 2018-07-22 DIAGNOSIS — H6121 Impacted cerumen, right ear: Secondary | ICD-10-CM | POA: Diagnosis not present

## 2018-07-22 NOTE — Progress Notes (Signed)
   Subjective:    Patient ID: Renee Maldonado, female    DOB: 1946-03-09, 72 y.o.   MRN: [id number]  Chief Complaint  Patient presents with  . ears plugged up    Ear Fullness   There is pain in the right ear. This is a recurrent problem. The current episode started in the past 7 days. The problem occurs constantly. The problem has been unchanged. There has been no fever. The pain is at a severity of 0/10. The patient is experiencing no pain. Associated symptoms include hearing loss. Pertinent negatives include no coughing, diarrhea, rhinorrhea or sore throat. The treatment provided no relief.      Review of Systems  HENT: Positive for hearing loss. Negative for rhinorrhea and sore throat.   Respiratory: Negative for cough.   Gastrointestinal: Negative for diarrhea.  All other systems reviewed and are negative.      Objective:   Physical Exam  Constitutional: She is oriented to person, place, and time. She appears well-developed and well-nourished. No distress.  HENT:  Head: Normocephalic and atraumatic.  Left Ear: External ear normal.  Right ear cerumen impaction   Eyes: Pupils are equal, round, and reactive to light.  Neck: Normal range of motion. Neck supple. No thyromegaly present.  Cardiovascular: Normal rate, regular rhythm, normal heart sounds and intact distal pulses.  No murmur heard. Pulmonary/Chest: Effort normal and breath sounds normal. No respiratory distress. She has no wheezes.  Abdominal: Soft. Bowel sounds are normal. She exhibits no distension. There is no tenderness.  Musculoskeletal: Normal range of motion. She exhibits no edema or tenderness.  Neurological: She is alert and oriented to person, place, and time. She has normal reflexes. No cranial nerve deficit.  Skin: Skin is warm and dry.  Psychiatric: She has a normal mood and affect. Her behavior is normal. Judgment and thought content normal.  Vitals reviewed.    BP (!) 158/87   Pulse 67   Temp 97.7  F (36.5 C) (Oral)   Ht 5\' 1"  (1.549 m)   Wt 201 lb 12.8 oz (91.5 kg)   BMI 38.13 kg/m      Assessment & Plan:  Renee Maldonado comes in today with chief complaint of ears plugged up   Diagnosis and orders addressed:  1. Right ear impacted cerumen Keep clean and dry OTC drops as needed RTO as needed    Renee Dun, FNP

## 2018-07-22 NOTE — Patient Instructions (Signed)
Earwax Buildup, Adult The ears produce a substance called earwax that helps keep bacteria out of the ear and protects the skin in the ear canal. Occasionally, earwax can build up in the ear and cause discomfort or hearing loss. What increases the risk? This condition is more likely to develop in people who:  Are female.  Are elderly.  Naturally produce more earwax.  Clean their ears often with cotton swabs.  Use earplugs often.  Use in-ear headphones often.  Wear hearing aids.  Have narrow ear canals.  Have earwax that is overly thick or sticky.  Have eczema.  Are dehydrated.  Have excess hair in the ear canal.  What are the signs or symptoms? Symptoms of this condition include:  Reduced or muffled hearing.  A feeling of fullness in the ear or feeling that the ear is plugged.  Fluid coming from the ear.  Ear pain.  Ear itch.  Ringing in the ear.  Coughing.  An obvious piece of earwax that can be seen inside the ear canal.  How is this diagnosed? This condition may be diagnosed based on:  Your symptoms.  Your medical history.  An ear exam. During the exam, your health care provider will look into your ear with an instrument called an otoscope.  You may have tests, including a hearing test. How is this treated? This condition may be treated by:  Using ear drops to soften the earwax.  Having the earwax removed by a health care provider. The health care provider may: ? Flush the ear with water. ? Use an instrument that has a loop on the end (curette). ? Use a suction device.  Surgery to remove the wax buildup. This may be done in severe cases.  Follow these instructions at home:  Take over-the-counter and prescription medicines only as told by your health care provider.  Do not put any objects, including cotton swabs, into your ear. You can clean the opening of your ear canal with a washcloth or facial tissue.  Follow instructions from your health  care provider about cleaning your ears. Do not over-clean your ears.  Drink enough fluid to keep your urine clear or pale yellow. This will help to thin the earwax.  Keep all follow-up visits as told by your health care provider. If earwax builds up in your ears often or if you use hearing aids, consider seeing your health care provider for routine, preventive ear cleanings. Ask your health care provider how often you should schedule your cleanings.  If you have hearing aids, clean them according to instructions from the manufacturer and your health care provider. Contact a health care provider if:  You have ear pain.  You develop a fever.  You have blood, pus, or other fluid coming from your ear.  You have hearing loss.  You have ringing in your ears that does not go away.  Your symptoms do not improve with treatment.  You feel like the room is spinning (vertigo). Summary  Earwax can build up in the ear and cause discomfort or hearing loss.  The most common symptoms of this condition include reduced or muffled hearing and a feeling of fullness in the ear or feeling that the ear is plugged.  This condition may be diagnosed based on your symptoms, your medical history, and an ear exam.  This condition may be treated by using ear drops to soften the earwax or by having the earwax removed by a health care provider.  Do   not put any objects, including cotton swabs, into your ear. You can clean the opening of your ear canal with a washcloth or facial tissue. This information is not intended to replace advice given to you by your health care provider. Make sure you discuss any questions you have with your health care provider. Document Released: 12/11/2004 Document Revised: 01/14/2017 Document Reviewed: 01/14/2017 Elsevier Interactive Patient Education  2018 [employer]  

## 2018-08-10 ENCOUNTER — Ambulatory Visit: Payer: Medicare Other | Admitting: Family Medicine

## 2018-09-02 ENCOUNTER — Other Ambulatory Visit: Payer: Self-pay | Admitting: Family

## 2018-09-15 ENCOUNTER — Other Ambulatory Visit: Payer: Self-pay | Admitting: Family Medicine

## 2018-09-15 DIAGNOSIS — E785 Hyperlipidemia, unspecified: Secondary | ICD-10-CM

## 2018-09-15 DIAGNOSIS — G4709 Other insomnia: Secondary | ICD-10-CM

## 2018-09-16 NOTE — Telephone Encounter (Signed)
OV 09/27/18

## 2018-09-17 ENCOUNTER — Ambulatory Visit: Payer: Medicare Other | Admitting: Family Medicine

## 2018-09-18 ENCOUNTER — Other Ambulatory Visit: Payer: Self-pay | Admitting: Family Medicine

## 2018-09-18 DIAGNOSIS — E785 Hyperlipidemia, unspecified: Secondary | ICD-10-CM

## 2018-09-27 ENCOUNTER — Ambulatory Visit: Payer: Medicare Other | Admitting: Family Medicine

## 2018-09-27 ENCOUNTER — Encounter: Payer: Self-pay | Admitting: Family Medicine

## 2018-09-27 VITALS — BP 134/80 | HR 76 | Temp 97.6°F | Ht 61.0 in | Wt 199.4 lb

## 2018-09-27 DIAGNOSIS — E785 Hyperlipidemia, unspecified: Secondary | ICD-10-CM

## 2018-09-27 DIAGNOSIS — E559 Vitamin D deficiency, unspecified: Secondary | ICD-10-CM

## 2018-09-27 DIAGNOSIS — F5101 Primary insomnia: Secondary | ICD-10-CM

## 2018-09-27 DIAGNOSIS — I10 Essential (primary) hypertension: Secondary | ICD-10-CM

## 2018-09-27 DIAGNOSIS — J439 Emphysema, unspecified: Secondary | ICD-10-CM

## 2018-09-27 DIAGNOSIS — F411 Generalized anxiety disorder: Secondary | ICD-10-CM | POA: Diagnosis not present

## 2018-09-27 DIAGNOSIS — K219 Gastro-esophageal reflux disease without esophagitis: Secondary | ICD-10-CM

## 2018-09-27 DIAGNOSIS — J0100 Acute maxillary sinusitis, unspecified: Secondary | ICD-10-CM

## 2018-09-27 DIAGNOSIS — G4709 Other insomnia: Secondary | ICD-10-CM

## 2018-09-27 DIAGNOSIS — E782 Mixed hyperlipidemia: Secondary | ICD-10-CM

## 2018-09-27 DIAGNOSIS — M8580 Other specified disorders of bone density and structure, unspecified site: Secondary | ICD-10-CM

## 2018-09-27 MED ORDER — CELECOXIB 200 MG PO CAPS: 200.0000 mg | ORAL_CAPSULE | Freq: Every day | ORAL | 5 refills | Status: DC

## 2018-09-27 MED ORDER — ROSUVASTATIN CALCIUM 20 MG PO TABS: 20.0000 mg | ORAL_TABLET | Freq: Every day | ORAL | 1 refills | Status: DC

## 2018-09-27 MED ORDER — FLUTICASONE PROPIONATE 50 MCG/ACT NA SUSP: 2.0000 | Freq: Every day | NASAL | 1 refills | Status: DC

## 2018-09-27 MED ORDER — RALOXIFENE HCL 60 MG PO TABS: 60.0000 mg | ORAL_TABLET | Freq: Every day | ORAL | 1 refills | Status: DC

## 2018-09-27 MED ORDER — FLUTICASONE FUROATE-VILANTEROL 100-25 MCG/INH IN AEPB: 1.0000 | INHALATION_SPRAY | Freq: Every day | RESPIRATORY_TRACT | 1 refills | Status: DC

## 2018-09-27 MED ORDER — RANITIDINE HCL 150 MG PO TABS: 150.0000 mg | ORAL_TABLET | Freq: Two times a day (BID) | ORAL | 1 refills | Status: DC

## 2018-09-27 MED ORDER — AMLODIPINE BESYLATE 5 MG PO TABS: 5.0000 mg | ORAL_TABLET | Freq: Every day | ORAL | 3 refills | Status: DC

## 2018-09-27 MED ORDER — LYSINE 500 MG PO TABS: 1.0000 | ORAL_TABLET | Freq: Two times a day (BID) | ORAL | 1 refills | Status: DC

## 2018-09-27 MED ORDER — SULFAMETHOXAZOLE-TRIMETHOPRIM 800-160 MG PO TABS: 1.0000 | ORAL_TABLET | Freq: Two times a day (BID) | ORAL | 0 refills | Status: DC

## 2018-09-27 MED ORDER — ALBUTEROL SULFATE HFA 108 (90 BASE) MCG/ACT IN AERS: 2.0000 | INHALATION_SPRAY | Freq: Four times a day (QID) | RESPIRATORY_TRACT | 3 refills | Status: DC | PRN

## 2018-09-27 MED ORDER — TRAZODONE HCL 50 MG PO TABS: 50.0000 mg | ORAL_TABLET | Freq: Every day | ORAL | 1 refills | Status: DC

## 2018-09-27 MED ORDER — HYDROCHLOROTHIAZIDE 25 MG PO TABS: 25.0000 mg | ORAL_TABLET | Freq: Every day | ORAL | 1 refills | Status: DC

## 2018-09-27 NOTE — Patient Instructions (Signed)
Vit D 1,000units, over the counter medication take 2 daily   PLEASE take ALL of you medicines!!

## 2018-09-27 NOTE — Progress Notes (Signed)
Subjective:  Patient ID: Renee Maldonado, female    DOB: [birth year]  Age: 72 y.o. MRN: [id number]  CC: Medical Management of Chronic Issues (cold for 10 days )   HPI Jeffery Oshiro presents for  follow-up of hypertension. Patient has no history of headache chest pain or shortness of breath or recent cough. Patient also denies symptoms of TIA such as focal numbness or weakness. Patient denies side effects from medication. States taking it regularly.  Patient in for follow-up of elevated cholesterol. Doing well without complaints on current medication. Denies side effects of statin including myalgia and arthralgia and nausea. Also in today for liver function testing. Currently no chest pain, shortness of breath or other cardiovascular related symptoms noted.  Taking Vitamin D in the calcium pills only. No other supplement.  History Renee Maldonado has a past medical history of Anxiety, GERD (gastroesophageal reflux disease), Humerus fracture (2007), Hyperlipidemia, Hypertension, and Insomnia.   She has a past surgical history that includes Total knee arthroplasty; Left shoulder surgery; Joint replacement; and Tubal ligation.   Her family history includes Alzheimer's disease in her mother; Cancer in her brother; Diabetes in her mother; Hypertension in her father; Stroke in her father.She reports that she quit smoking about 4 years ago. Her smoking use included cigarettes. She has a 20.00 pack-year smoking history. She has never used smokeless tobacco. She reports that she drinks alcohol. She reports that she does not use drugs.  Current Outpatient Medications on File Prior to Visit  Medication Sig Dispense Refill  . aspirin 81 MG tablet Take 81 mg by mouth daily.    . Aspirin-Caffeine (BAYER BACK & BODY PAIN EX ST) 500-32.5 MG TABS Take 1 tablet by mouth daily as needed.    . Calcium-Vitamin D-Vitamin K (CALCIUM + D + K PO) Take 1 tablet by mouth 2 (two) times daily before a meal.     . cholecalciferol  (VITAMIN D) 1000 UNITS tablet Take 1,000 Units by mouth daily.    . fish oil-omega-3 fatty acids 1000 MG capsule Take 1 g by mouth 2 (two) times daily before a meal.    . Melatonin 5 MG TABS Take 1 tablet by mouth daily.    . ondansetron (ZOFRAN-ODT) 8 MG disintegrating tablet Take 1 tablet (8 mg total) by mouth every 6 (six) hours as needed for nausea or vomiting. 20 tablet 1  . RESTASIS 0.05 % ophthalmic emulsion Place 1 drop into both eyes 2 (two) times daily as needed.     . valACYclovir (VALTREX) 500 MG tablet Take 1 tablet (500 mg total) by mouth daily. As needed 30 tablet 11   No current facility-administered medications on file prior to visit.     ROS Review of Systems  Constitutional: Negative.  Negative for activity change, appetite change, chills and fever.  HENT: Positive for congestion, postnasal drip, rhinorrhea and sinus pressure. Negative for ear discharge, ear pain, hearing loss, nosebleeds, sneezing and trouble swallowing.   Eyes: Negative for visual disturbance.  Respiratory: Negative for chest tightness and shortness of breath.   Cardiovascular: Negative for chest pain and palpitations.  Gastrointestinal: Negative for abdominal pain, constipation, diarrhea, nausea and vomiting.  Genitourinary: Negative for difficulty urinating.  Musculoskeletal: Positive for arthralgias (Right knee.  She needs surgery for replacement.  However she has no one to help her.). Negative for myalgias.  Skin: Negative for rash.  Neurological: Negative for headaches.  Psychiatric/Behavioral: Positive for sleep disturbance (Patient sleeps okay she says.  Her knee tends to  keep her awake at times.  Lately the sinus problem has kept her awake.).    Objective:  BP 134/80   Pulse 76   Temp 97.6 F (36.4 C) (Oral)   Ht _0  (1.549 m)   Wt 199 lb 6.4 oz (90.4 kg)   BMI 37.68 kg/m   BP Readings from Last 3 Encounters:  09/27/18 134/80  07/22/18 (!) 158/87  02/08/18 (!) 158/78    Wt  Readings from Last 3 Encounters:  09/27/18 199 lb 6.4 oz (90.4 kg)  07/22/18 201 lb 12.8 oz (91.5 kg)  02/08/18 200 lb (90.7 kg)     Physical Exam  Constitutional: She is oriented to person, place, and time. She appears well-developed and well-nourished. No distress.  HENT:  Head: Normocephalic and atraumatic.  Right Ear: Tympanic membrane and external ear normal. No decreased hearing is noted.  Left Ear: Tympanic membrane and external ear normal. No decreased hearing is noted.  Nose: Mucosal edema present. Right sinus exhibits no frontal sinus tenderness. Left sinus exhibits no frontal sinus tenderness.  Mouth/Throat: Oropharynx is clear and moist. No oropharyngeal exudate or posterior oropharyngeal erythema.  Eyes: Pupils are equal, round, and reactive to light. Conjunctivae and EOM are normal.  Neck: Normal range of motion. Neck supple. No Brudzinski's sign noted. No thyromegaly present.  Cardiovascular: Normal rate, regular rhythm and normal heart sounds.  No murmur heard. Pulmonary/Chest: Effort normal and breath sounds normal. No respiratory distress. She has no wheezes. She has no rales.  Abdominal: Soft. Bowel sounds are normal. She exhibits no distension. There is no tenderness.  Lymphadenopathy:       Head (right side): No preauricular adenopathy present.       Head (left side): No preauricular adenopathy present.    She has no cervical adenopathy.       Right cervical: No superficial cervical adenopathy present.      Left cervical: No superficial cervical adenopathy present.  Neurological: She is alert and oriented to person, place, and time. She has normal reflexes.  Skin: Skin is warm and dry.  Psychiatric: She has a normal mood and affect. Her behavior is normal. Judgment and thought content normal.      Assessment & Plan:   Renee Maldonado was seen today for medical management of chronic issues.  Diagnoses and all orders for this visit:  Essential hypertension -      CMP14+EGFR -     CBC with Differential/Platelet -     Lipid panel -     hydrochlorothiazide (HYDRODIURIL) 25 MG tablet; Take 1 tablet (25 mg total) by mouth daily.  GAD (generalized anxiety disorder) -     CMP14+EGFR -     CBC with Differential/Platelet -     Lipid panel  Mixed hyperlipidemia -     CMP14+EGFR -     CBC with Differential/Platelet -     Lipid panel  Gastroesophageal reflux disease without esophagitis -     CMP14+EGFR -     CBC with Differential/Platelet -     Lipid panel -     ranitidine (ZANTAC) 150 MG tablet; Take 1 tablet (150 mg total) by mouth 2 (two) times daily.  Primary insomnia -     CMP14+EGFR -     CBC with Differential/Platelet -     Lipid panel  Pulmonary emphysema, unspecified emphysema type (HCC) -     CMP14+EGFR -     CBC with Differential/Platelet -     Lipid panel  Osteopenia,  unspecified location -     raloxifene (EVISTA) 60 MG tablet; Take 1 tablet (60 mg total) by mouth daily.  Hyperlipidemia, unspecified hyperlipidemia type -     rosuvastatin (CRESTOR) 20 MG tablet; Take 1 tablet (20 mg total) by mouth daily.  Other insomnia -     traZODone (DESYREL) 50 MG tablet; Take 1 tablet (50 mg total) by mouth at bedtime.  Vitamin D deficiency  Acute maxillary sinusitis, recurrence not specified  Morbid obesity ([place])  Other orders -     albuterol (PROVENTIL HFA;VENTOLIN HFA) 108 (90 Base) MCG/ACT inhaler; Inhale 2 puffs into the lungs every 6 (six) hours as needed for wheezing or shortness of breath. -     amLODipine (NORVASC) 5 MG tablet; Take 1 tablet (5 mg total) by mouth daily. -     fluticasone (FLONASE) 50 MCG/ACT nasal spray; Place 2 sprays into both nostrils daily. -     fluticasone furoate-vilanterol (BREO ELLIPTA) 100-25 MCG/INH AEPB; Inhale 1 puff into the lungs daily. -     Lysine 500 MG TABS; Take 1 tablet (500 mg total) by mouth 2 (two) times daily before a meal. -     sulfamethoxazole-trimethoprim (BACTRIM DS,SEPTRA DS)  800-160 MG tablet; Take 1 tablet by mouth 2 (two) times daily. Until gone, for infection -     celecoxib (CELEBREX) 200 MG capsule; Take 1 capsule (200 mg total) by mouth daily. With food for joint pain   Allergies as of 09/27/2018      Reactions   Benadryl [diphenhydramine Hcl]    Penicillins    Had a rash around 20 years ago but unsure if it come from medicine.       Medication List        Accurate as of 09/27/18  1:48 PM. Always use your most recent med list.          albuterol 108 (90 Base) MCG/ACT inhaler Commonly known as:  PROVENTIL HFA;VENTOLIN HFA Inhale 2 puffs into the lungs every 6 (six) hours as needed for wheezing or shortness of breath.   amLODipine 5 MG tablet Commonly known as:  NORVASC Take 1 tablet (5 mg total) by mouth daily.   aspirin 81 MG tablet Take 81 mg by mouth daily.   BAYER BACK & BODY PAIN EX ST 500-32.5 MG Tabs Generic drug:  Aspirin-Caffeine Take 1 tablet by mouth daily as needed.   CALCIUM + D + K PO Take 1 tablet by mouth 2 (two) times daily before a meal.   celecoxib 200 MG capsule Commonly known as:  CELEBREX Take 1 capsule (200 mg total) by mouth daily. With food for joint pain   cholecalciferol 1000 units tablet Commonly known as:  VITAMIN D Take 1,000 Units by mouth daily.   fish oil-omega-3 fatty acids 1000 MG capsule Take 1 g by mouth 2 (two) times daily before a meal.   fluticasone 50 MCG/ACT nasal spray Commonly known as:  FLONASE Place 2 sprays into both nostrils daily.   fluticasone furoate-vilanterol 100-25 MCG/INH Aepb Commonly known as:  BREO ELLIPTA Inhale 1 puff into the lungs daily.   hydrochlorothiazide 25 MG tablet Commonly known as:  HYDRODIURIL Take 1 tablet (25 mg total) by mouth daily.   Lysine 500 MG Tabs Take 1 tablet (500 mg total) by mouth 2 (two) times daily before a meal.   Melatonin 5 MG Tabs Take 1 tablet by mouth daily.   ondansetron 8 MG disintegrating tablet Commonly known as:   ZOFRAN-ODT  Take 1 tablet (8 mg total) by mouth every 6 (six) hours as needed for nausea or vomiting.   raloxifene 60 MG tablet Commonly known as:  EVISTA Take 1 tablet (60 mg total) by mouth daily.   ranitidine 150 MG tablet Commonly known as:  ZANTAC Take 1 tablet (150 mg total) by mouth 2 (two) times daily.   RESTASIS 0.05 % ophthalmic emulsion Generic drug:  cycloSPORINE Place 1 drop into both eyes 2 (two) times daily as needed.   rosuvastatin 20 MG tablet Commonly known as:  CRESTOR Take 1 tablet (20 mg total) by mouth daily.   sulfamethoxazole-trimethoprim 800-160 MG tablet Commonly known as:  BACTRIM DS,SEPTRA DS Take 1 tablet by mouth 2 (two) times daily. Until gone, for infection   traZODone 50 MG tablet Commonly known as:  DESYREL Take 1 tablet (50 mg total) by mouth at bedtime.   valACYclovir 500 MG tablet Commonly known as:  VALTREX Take 1 tablet (500 mg total) by mouth daily. As needed       Meds ordered this encounter  Medications  . albuterol (PROVENTIL HFA;VENTOLIN HFA) 108 (90 Base) MCG/ACT inhaler    Sig: Inhale 2 puffs into the lungs every 6 (six) hours as needed for wheezing or shortness of breath.    Dispense:  1 Inhaler    Refill:  3    With spacer  . amLODipine (NORVASC) 5 MG tablet    Sig: Take 1 tablet (5 mg total) by mouth daily.    Dispense:  90 tablet    Refill:  3  . fluticasone (FLONASE) 50 MCG/ACT nasal spray    Sig: Place 2 sprays into both nostrils daily.    Dispense:  16 g    Refill:  1  . fluticasone furoate-vilanterol (BREO ELLIPTA) 100-25 MCG/INH AEPB    Sig: Inhale 1 puff into the lungs daily.    Dispense:  3 each    Refill:  1  . hydrochlorothiazide (HYDRODIURIL) 25 MG tablet    Sig: Take 1 tablet (25 mg total) by mouth daily.    Dispense:  90 tablet    Refill:  1  . Lysine 500 MG TABS    Sig: Take 1 tablet (500 mg total) by mouth 2 (two) times daily before a meal.    Dispense:  180 tablet    Refill:  1  . raloxifene  (EVISTA) 60 MG tablet    Sig: Take 1 tablet (60 mg total) by mouth daily.    Dispense:  90 tablet    Refill:  1  . ranitidine (ZANTAC) 150 MG tablet    Sig: Take 1 tablet (150 mg total) by mouth 2 (two) times daily.    Dispense:  180 tablet    Refill:  1  . rosuvastatin (CRESTOR) 20 MG tablet    Sig: Take 1 tablet (20 mg total) by mouth daily.    Dispense:  90 tablet    Refill:  1    $  . traZODone (DESYREL) 50 MG tablet    Sig: Take 1 tablet (50 mg total) by mouth at bedtime.    Dispense:  90 tablet    Refill:  1    $  . sulfamethoxazole-trimethoprim (BACTRIM DS,SEPTRA DS) 800-160 MG tablet    Sig: Take 1 tablet by mouth 2 (two) times daily. Until gone, for infection    Dispense:  20 tablet    Refill:  0  . celecoxib (CELEBREX) 200 MG capsule    Sig:  Take 1 capsule (200 mg total) by mouth daily. With food for joint pain    Dispense:  30 capsule    Refill:  5    Follow-up: Return in about 6 months (around 03/28/2019) for Wellness.  Claretta Fraise, M.D.

## 2018-09-28 LAB — CBC WITH DIFFERENTIAL/PLATELET
Basophils Absolute: 0 10*3/uL (ref 0.0–0.2)
Basos: 1 %
EOS (ABSOLUTE): 0.2 10*3/uL (ref 0.0–0.4)
Eos: 4 %
HEMATOCRIT: 40.1 % (ref 34.0–46.6)
HEMOGLOBIN: 13 g/dL (ref 11.1–15.9)
IMMATURE GRANS (ABS): 0 10*3/uL (ref 0.0–0.1)
IMMATURE GRANULOCYTES: 0 %
LYMPHS: 25 %
Lymphocytes Absolute: 1.6 10*3/uL (ref 0.7–3.1)
MCH: 26.6 pg (ref 26.6–33.0)
MCHC: 32.4 g/dL (ref 31.5–35.7)
MCV: 82 fL (ref 79–97)
MONOCYTES: 8 %
Monocytes Absolute: 0.5 10*3/uL (ref 0.1–0.9)
NEUTROS PCT: 62 %
Neutrophils Absolute: 3.9 10*3/uL (ref 1.4–7.0)
Platelets: 326 10*3/uL (ref 150–450)
RBC: 4.89 x10E6/uL (ref 3.77–5.28)
RDW: 13.9 % (ref 12.3–15.4)
WBC: 6.3 10*3/uL (ref 3.4–10.8)

## 2018-09-28 LAB — LIPID PANEL
CHOL/HDL RATIO: 3.2 ratio (ref 0.0–4.4)
Cholesterol, Total: 146 mg/dL (ref 100–199)
HDL: 45 mg/dL (ref >39)
LDL CALC: 65 mg/dL (ref 0–99)
TRIGLYCERIDES: 181 mg/dL — AB (ref 0–149)
VLDL Cholesterol Cal: 36 mg/dL (ref 5–40)

## 2018-09-28 LAB — CMP14+EGFR
A/G RATIO: 2.4 — AB (ref 1.2–2.2)
ALBUMIN: 4.7 g/dL (ref 3.5–4.8)
ALK PHOS: 79 IU/L (ref 39–117)
ALT: 17 IU/L (ref 0–32)
AST: 17 IU/L (ref 0–40)
BUN/Creatinine Ratio: 20 (ref 12–28)
BUN: 22 mg/dL (ref 8–27)
Bilirubin Total: 0.2 mg/dL (ref 0.0–1.2)
CALCIUM: 9.9 mg/dL (ref 8.7–10.3)
CO2: 25 mmol/L (ref 20–29)
CREATININE: 1.11 mg/dL — AB (ref 0.57–1.00)
Chloride: 99 mmol/L (ref 96–106)
GFR calc Af Amer: 57 mL/min/{1.73_m2} — ABNORMAL LOW (ref >59)
GFR calc non Af Amer: 49 mL/min/{1.73_m2} — ABNORMAL LOW (ref >59)
GLOBULIN, TOTAL: 2 g/dL (ref 1.5–4.5)
Glucose: 117 mg/dL — ABNORMAL HIGH (ref 65–99)
Potassium: 4.4 mmol/L (ref 3.5–5.2)
Sodium: 139 mmol/L (ref 134–144)
Total Protein: 6.7 g/dL (ref 6.0–8.5)

## 2018-09-28 NOTE — Progress Notes (Signed)
Hello Kamiya,  Your lab result is normal.Some minor variations that are not significant are commonly marked abnormal, but do not represent any medical problem for you.  Best regards, Claretta Fraise, M.D.

## 2019-01-06 ENCOUNTER — Ambulatory Visit: Payer: Medicare Other | Admitting: Family

## 2019-01-06 ENCOUNTER — Encounter: Payer: Self-pay | Admitting: Family

## 2019-01-06 VITALS — BP 128/80 | HR 73 | Temp 97.5°F | Ht 61.0 in | Wt 198.8 lb

## 2019-01-06 DIAGNOSIS — J441 Chronic obstructive pulmonary disease with (acute) exacerbation: Secondary | ICD-10-CM

## 2019-01-06 MED ORDER — AZITHROMYCIN 250 MG PO TABS: ORAL_TABLET | ORAL | 0 refills | Status: DC

## 2019-01-06 MED ORDER — PREDNISONE 10 MG (21) PO TBPK: ORAL_TABLET | ORAL | 0 refills | Status: DC

## 2019-01-06 NOTE — Progress Notes (Signed)
Subjective:    Patient ID: Renee Maldonado, female    DOB: 02-06-1946, 73 y.o.   MRN: [id number]  Chief Complaint  Patient presents with  . cough ,congestion,wheezing for a week  . stuffy nose with dripping    Cough  This is a new problem. The current episode started in the past 7 days. The problem has been unchanged. The problem occurs every few minutes. The cough is non-productive. Associated symptoms include ear congestion, ear pain, headaches, nasal congestion, a sore throat, shortness of breath and wheezing. Pertinent negatives include no chills, fever or myalgias. The symptoms are aggravated by lying down. Risk factors for lung disease include smoking/tobacco exposure. She has tried rest and OTC cough suppressant for the symptoms. The treatment provided mild relief. Her past medical history is significant for COPD.      Review of Systems  Constitutional: Negative for chills and fever.  HENT: Positive for ear pain and sore throat.   Respiratory: Positive for cough, shortness of breath and wheezing.   Musculoskeletal: Negative for myalgias.  Neurological: Positive for headaches.  All other systems reviewed and are negative.      Objective:   Physical Exam Vitals signs reviewed.  Constitutional:      General: She is not in acute distress.    Appearance: She is well-developed.  HENT:     Head: Normocephalic and atraumatic.     Right Ear: Tympanic membrane normal.     Left Ear: Tympanic membrane normal.  Eyes:     Pupils: Pupils are equal, round, and reactive to light.  Neck:     Musculoskeletal: Normal range of motion and neck supple.     Thyroid: No thyromegaly.  Cardiovascular:     Rate and Rhythm: Normal rate and regular rhythm.     Heart sounds: Normal heart sounds. No murmur.  Pulmonary:     Effort: Pulmonary effort is normal. No respiratory distress.     Breath sounds: Wheezing present.     Comments: Intermittent, tight  nonproductive cough Abdominal:   General: Bowel sounds are normal. There is no distension.     Palpations: Abdomen is soft.     Tenderness: There is no abdominal tenderness.  Musculoskeletal: Normal range of motion.        General: No tenderness.  Skin:    General: Skin is warm and dry.  Neurological:     Mental Status: She is alert and oriented to person, place, and time.     Cranial Nerves: No cranial nerve deficit.     Deep Tendon Reflexes: Reflexes are normal and symmetric.  Psychiatric:        Behavior: Behavior normal.        Thought Content: Thought content normal.        Judgment: Judgment normal.       BP 128/80   Pulse 73   Temp (!) 97.5 F (36.4 C) (Oral)   Ht 5\' 1"  (1.549 m)   Wt 198 lb 12.8 oz (90.2 kg)   BMI 37.56 kg/m      Assessment & Plan:  Renee Maldonado comes in today with chief complaint of cough ,congestion,wheezing for a week and stuffy nose with dripping   Diagnosis and orders addressed:  1. Chronic obstructive pulmonary disease with acute exacerbation (HCC) - Take meds as prescribed - Use a cool mist humidifier  -Use saline nose sprays frequently -Force fluids -For any cough or congestion  Use plain Mucinex- regular strength or max strength is  fine -For fever or aces or pains- take tylenol or ibuprofen. -Throat lozenges if help -RTO if symptoms worsen or do not improve  - predniSONE (STERAPRED UNI-PAK 21 TAB) 10 MG (21) TBPK tablet; Use as directed  Dispense: 21 tablet; Refill: 0 - azithromycin (ZITHROMAX) 250 MG tablet; Take 500 mg once, then 250 mg for four days  Dispense: 6 tablet; Refill: 0   Renee Dun, FNP

## 2019-01-06 NOTE — Patient Instructions (Signed)

## 2019-01-16 HISTORY — PX: BREAST SURGERY: SHX581

## 2019-01-19 ENCOUNTER — Other Ambulatory Visit: Payer: Self-pay | Admitting: Family Medicine

## 2019-01-19 ENCOUNTER — Telehealth: Payer: Self-pay | Admitting: *Deleted

## 2019-01-19 MED ORDER — FAMOTIDINE 40 MG PO TABS: 40.0000 mg | ORAL_TABLET | Freq: Every day | ORAL | 3 refills | Status: DC

## 2019-01-19 NOTE — Telephone Encounter (Signed)
I sent in the requested prescription 

## 2019-01-19 NOTE — Telephone Encounter (Signed)
Aware. 

## 2019-01-19 NOTE — Telephone Encounter (Signed)
Fax from [facility] requesting alternative for Ranitidine 150 mg, concerned with recalls Please advise

## 2019-02-01 LAB — HM MAMMOGRAPHY

## 2019-02-03 ENCOUNTER — Telehealth: Payer: Self-pay | Admitting: Family Medicine

## 2019-02-03 NOTE — Telephone Encounter (Signed)
[facility] is talking with pt

## 2019-02-09 LAB — HM MAMMOGRAPHY

## 2019-02-11 ENCOUNTER — Telehealth: Payer: Self-pay | Admitting: Family Medicine

## 2019-02-11 ENCOUNTER — Other Ambulatory Visit: Payer: Self-pay | Admitting: Family Medicine

## 2019-02-11 MED ORDER — CLINDAMYCIN HCL 300 MG PO CAPS: 600.0000 mg | ORAL_CAPSULE | Freq: Once | ORAL | 2 refills | Status: AC

## 2019-02-11 NOTE — Telephone Encounter (Signed)
Pt aware of meds and instructions

## 2019-02-11 NOTE — Telephone Encounter (Signed)
I sent in the requested prescription 

## 2019-02-11 NOTE — Telephone Encounter (Signed)
Pt aware.

## 2019-02-15 ENCOUNTER — Telehealth: Payer: Self-pay | Admitting: Family Medicine

## 2019-02-15 NOTE — Telephone Encounter (Signed)
Advised pt this is a one time dose that she should take the day of her biopsy. Pt voiced understanding.

## 2019-02-16 HISTORY — PX: BREAST LUMPECTOMY: SHX2

## 2019-02-18 ENCOUNTER — Telehealth: Payer: Self-pay | Admitting: Family Medicine

## 2019-02-18 ENCOUNTER — Other Ambulatory Visit: Payer: Self-pay | Admitting: Family Medicine

## 2019-02-18 DIAGNOSIS — C50911 Malignant neoplasm of unspecified site of right female breast: Secondary | ICD-10-CM

## 2019-02-18 NOTE — Telephone Encounter (Signed)
PT states she said she got off of phone with Dr Livia Snellen and wants to talk to him again about what type of breast cancer she has.

## 2019-02-18 NOTE — Telephone Encounter (Signed)
Per Dr Livia Snellen he has already spoke with her, will close encounter.

## 2019-02-23 DIAGNOSIS — Z170 Estrogen receptor positive status [ER+]: Secondary | ICD-10-CM | POA: Insufficient documentation

## 2019-02-23 DIAGNOSIS — C50311 Malignant neoplasm of lower-inner quadrant of right female breast: Secondary | ICD-10-CM | POA: Insufficient documentation

## 2019-03-25 ENCOUNTER — Telehealth: Payer: Self-pay | Admitting: Family Medicine

## 2019-03-28 ENCOUNTER — Other Ambulatory Visit: Payer: Self-pay

## 2019-03-28 ENCOUNTER — Ambulatory Visit (INDEPENDENT_AMBULATORY_CARE_PROVIDER_SITE_OTHER): Payer: Medicare Other | Admitting: Family Medicine

## 2019-03-28 DIAGNOSIS — G4709 Other insomnia: Secondary | ICD-10-CM | POA: Diagnosis not present

## 2019-03-28 DIAGNOSIS — E785 Hyperlipidemia, unspecified: Secondary | ICD-10-CM | POA: Diagnosis not present

## 2019-03-28 DIAGNOSIS — J441 Chronic obstructive pulmonary disease with (acute) exacerbation: Secondary | ICD-10-CM | POA: Diagnosis not present

## 2019-03-28 DIAGNOSIS — M8589 Other specified disorders of bone density and structure, multiple sites: Secondary | ICD-10-CM | POA: Diagnosis not present

## 2019-03-28 MED ORDER — RISEDRONATE SODIUM 150 MG PO TABS: 150.0000 mg | ORAL_TABLET | ORAL | 3 refills | Status: DC

## 2019-03-28 NOTE — Telephone Encounter (Signed)
Phone visit conducted

## 2019-03-28 NOTE — Progress Notes (Signed)
Subjective:    Patient ID: Renee Maldonado, female    DOB: 04-06-1946, 73 y.o.   MRN: [id number]   HPI: Renee Maldonado is a 72 y.o. female presenting for breast cancer follow up. Happy with Dr. Robina Ade and Dr. Burt Ek. Does want to avoid radiation. I discussed with her that Dr. Burt Ek recommends DC of evista since she recommends the arimadex.   Depression screen [facility] 2/9 01/06/2019 09/27/2018 07/22/2018 02/08/2018 07/23/2017  Decreased Interest 0 0 0 0 0  Down, Depressed, Hopeless 0 0 0 0 0  PHQ - 2 Score 0 0 0 0 0     Relevant past medical, surgical, family and social history reviewed and updated as indicated.  Interim medical history since our last visit reviewed. Allergies and medications reviewed and updated.  ROS:  Review of Systems  Constitutional: Negative.   HENT: Negative for congestion.   Eyes: Negative for visual disturbance.  Respiratory: Negative for shortness of breath.   Cardiovascular: Negative for chest pain.  Gastrointestinal: Negative for abdominal pain, constipation, diarrhea, nausea and vomiting.  Genitourinary: Negative for difficulty urinating.  Musculoskeletal: Negative for arthralgias and myalgias.  Neurological: Negative for headaches.  Psychiatric/Behavioral: Negative for sleep disturbance.     Social History   Tobacco Use  Smoking Status Former Smoker  . Packs/day: 0.50  . Years: 40.00  . Pack years: 20.00  . Types: Cigarettes  . Last attempt to quit: 09/17/2014  . Years since quitting: 4.5  Smokeless Tobacco Never Used  Tobacco Comment   electronic cigarettes x 2 weeks. She was smoking 7 cigarettes a day x 40 yrs       Objective:     Wt Readings from Last 3 Encounters:  01/06/19 198 lb 12.8 oz (90.2 kg)  09/27/18 199 lb 6.4 oz (90.4 kg)  07/22/18 201 lb 12.8 oz (91.5 kg)     Exam deferred. Pt. Harboring due to COVID 19. Phone visit performed.   Assessment & Plan:   1. Osteopenia of multiple sites   2. Other insomnia   3.  Hyperlipidemia, unspecified hyperlipidemia type   4. Chronic obstructive pulmonary disease with acute exacerbation (HCC)     Meds ordered this encounter  Medications  . risedronate (ACTONEL) 150 MG tablet    Sig: Take 1 tablet (150 mg total) by mouth every 30 (thirty) days. with water, on an empty stomach, take nothing by mouth and to not lie down for 30 minutes after each dose.    Dispense:  3 tablet    Refill:  3  . traZODone (DESYREL) 50 MG tablet    Sig: Take 1 tablet (50 mg total) by mouth at bedtime.    Dispense:  90 tablet    Refill:  1    $  . rosuvastatin (CRESTOR) 20 MG tablet    Sig: Take 1 tablet (20 mg total) by mouth daily.    Dispense:  90 tablet    Refill:  1    $  . fluticasone furoate-vilanterol (BREO ELLIPTA) 100-25 MCG/INH AEPB    Sig: Inhale 1 puff into the lungs daily.    Dispense:  3 each    Refill:  1  . celecoxib (CELEBREX) 200 MG capsule    Sig: Take 1 capsule (200 mg total) by mouth daily. With food for joint pain    Dispense:  30 capsule    Refill:  5  . fluticasone (FLONASE) 50 MCG/ACT nasal spray    Sig: Place 2 sprays into both nostrils  daily.    Dispense:  16 g    Refill:  1    No orders of the defined types were placed in this encounter.     Diagnoses and all orders for this visit:  Osteopenia of multiple sites  Other insomnia -     traZODone (DESYREL) 50 MG tablet; Take 1 tablet (50 mg total) by mouth at bedtime.  Hyperlipidemia, unspecified hyperlipidemia type -     rosuvastatin (CRESTOR) 20 MG tablet; Take 1 tablet (20 mg total) by mouth daily.  Chronic obstructive pulmonary disease with acute exacerbation (HCC)  Other orders -     risedronate (ACTONEL) 150 MG tablet; Take 1 tablet (150 mg total) by mouth every 30 (thirty) days. with water, on an empty stomach, take nothing by mouth and to not lie down for 30 minutes after each dose. -     fluticasone furoate-vilanterol (BREO ELLIPTA) 100-25 MCG/INH AEPB; Inhale 1 puff into the  lungs daily. -     celecoxib (CELEBREX) 200 MG capsule; Take 1 capsule (200 mg total) by mouth daily. With food for joint pain -     fluticasone (FLONASE) 50 MCG/ACT nasal spray; Place 2 sprays into both nostrils daily.    Virtual Visit via telephone Note  I discussed the limitations, risks, security and privacy concerns of performing an evaluation and management service by telephone and the availability of in person appointments. The patient was identified with two identifiers. Pt.expressed understanding and agreed to proceed. Pt. Is at home. Dr. Livia Snellen is in his office.  Follow Up Instructions:   I discussed the assessment and treatment plan with the patient. The patient was provided an opportunity to ask questions and all were answered. The patient agreed with the plan and demonstrated an understanding of the instructions.   The patient was advised to call back or seek an in-person evaluation if the symptoms worsen or if the condition fails to improve as anticipated.   Total minutes including chart review and phone contact time: 30   Follow up plan: No follow-ups on file.  Claretta Fraise, MD [place]

## 2019-03-29 ENCOUNTER — Encounter: Payer: Self-pay | Admitting: Family Medicine

## 2019-03-29 MED ORDER — TRAZODONE HCL 50 MG PO TABS: 50.0000 mg | ORAL_TABLET | Freq: Every day | ORAL | 1 refills | Status: DC

## 2019-03-29 MED ORDER — FLUTICASONE PROPIONATE 50 MCG/ACT NA SUSP: 2.0000 | Freq: Every day | NASAL | 1 refills | Status: DC

## 2019-03-29 MED ORDER — ROSUVASTATIN CALCIUM 20 MG PO TABS: 20.0000 mg | ORAL_TABLET | Freq: Every day | ORAL | 1 refills | Status: DC

## 2019-03-29 MED ORDER — CELECOXIB 200 MG PO CAPS: 200.0000 mg | ORAL_CAPSULE | Freq: Every day | ORAL | 5 refills | Status: DC

## 2019-03-29 MED ORDER — FLUTICASONE FUROATE-VILANTEROL 100-25 MCG/INH IN AEPB: 1.0000 | INHALATION_SPRAY | Freq: Every day | RESPIRATORY_TRACT | 1 refills | Status: DC

## 2019-05-10 NOTE — Progress Notes (Unsigned)
a 

## 2019-06-03 ENCOUNTER — Encounter: Payer: Self-pay | Admitting: *Deleted

## 2019-06-29 ENCOUNTER — Ambulatory Visit: Payer: Medicare Other | Admitting: Family Medicine

## 2019-06-30 ENCOUNTER — Ambulatory Visit (INDEPENDENT_AMBULATORY_CARE_PROVIDER_SITE_OTHER): Payer: Medicare Other | Admitting: Family Medicine

## 2019-06-30 DIAGNOSIS — F411 Generalized anxiety disorder: Secondary | ICD-10-CM

## 2019-06-30 DIAGNOSIS — Z170 Estrogen receptor positive status [ER+]: Secondary | ICD-10-CM

## 2019-06-30 DIAGNOSIS — E782 Mixed hyperlipidemia: Secondary | ICD-10-CM

## 2019-06-30 DIAGNOSIS — I10 Essential (primary) hypertension: Secondary | ICD-10-CM | POA: Diagnosis not present

## 2019-06-30 DIAGNOSIS — C50919 Malignant neoplasm of unspecified site of unspecified female breast: Secondary | ICD-10-CM

## 2019-06-30 DIAGNOSIS — J439 Emphysema, unspecified: Secondary | ICD-10-CM

## 2019-06-30 DIAGNOSIS — E559 Vitamin D deficiency, unspecified: Secondary | ICD-10-CM

## 2019-06-30 MED ORDER — RISEDRONATE SODIUM 150 MG PO TABS: 150.0000 mg | ORAL_TABLET | ORAL | 3 refills | Status: DC

## 2019-06-30 MED ORDER — MELOXICAM 15 MG PO TABS: 15.0000 mg | ORAL_TABLET | Freq: Every day | ORAL | 5 refills | Status: DC

## 2019-06-30 NOTE — Progress Notes (Signed)
Subjective:  Patient ID: Renee Maldonado,  female    DOB: 05/15/1946  Age: 73 y.o.    CC: No chief complaint on file.   HPI Renee Maldonado presents for  follow-up of hypertension. Patient has no history of headache chest pain or shortness of breath or recent cough. Patient also denies symptoms of TIA such as numbness weakness lateralizing. Patient denies side effects from medication. States taking it regularly.  Patient also  in for follow-up of elevated cholesterol. Doing well without complaints on current medication. Denies side effects  including myalgia and arthralgia and nausea. Also in today for liver function testing. Currently no chest pain, shortness of breath or other cardiovascular related symptoms noted.  Pt. Expresses frustration with me and the practice. Feels abandoned due to breast cancer and COVID. Okay with phone visit, would have preferred in office though. Had three month checkup with oncology for breast cancer. Didn't realize she had actonel prescribed so she hasn't taken it. Pt. Asked oncology to prescribe prolia. Planned blood test then prolia planned. It will be done with each 6 months cancer follow up. Tor Netters is her oncologist with [facility] at [facility] center [phone].   Pt. Wants prolia given here. Pt. Wants [place] to do insurance approval., but will pay cash if necessary.   130/60, HR 80, Wt. 197, resp 16, T 96.8, O2 sat 97. Yesterday at oncology.  They got it all or most of her breast tumort. Now taking arimedex.   Joint pain in knee. Stopped taking celebrex due to a news report. Friend is taking mobic. She would like to try this      History Renee Maldonado has a past medical history of Anxiety, GERD (gastroesophageal reflux disease), Humerus fracture (2007), Hyperlipidemia, Hypertension, and Insomnia.   She has a past surgical history that includes Total knee arthroplasty; Left shoulder surgery; Joint replacement; and Tubal  ligation.   Her family history includes Alzheimer's disease in her mother; Cancer in her brother; Diabetes in her mother; Hypertension in her father; Stroke in her father.She reports that she quit smoking about 4 years ago. Her smoking use included cigarettes. She has a 20.00 pack-year smoking history. She has never used smokeless tobacco. She reports current alcohol use. She reports that she does not use drugs.  Current Outpatient Medications on File Prior to Visit  Medication Sig Dispense Refill  . albuterol (PROVENTIL HFA;VENTOLIN HFA) 108 (90 Base) MCG/ACT inhaler Inhale 2 puffs into the lungs every 6 (six) hours as needed for wheezing or shortness of breath. 1 Inhaler 3  . amLODipine (NORVASC) 5 MG tablet Take 1 tablet (5 mg total) by mouth daily. 90 tablet 3  . aspirin 81 MG tablet Take 81 mg by mouth daily.    . Aspirin-Caffeine (BAYER BACK & BODY PAIN EX ST) 500-32.5 MG TABS Take 1 tablet by mouth daily as needed.    . Calcium-Vitamin D-Vitamin K (CALCIUM + D + K PO) Take 1 tablet by mouth 2 (two) times daily before a meal.     . celecoxib (CELEBREX) 200 MG capsule Take 1 capsule (200 mg total) by mouth daily. With food for joint pain 30 capsule 5  . cholecalciferol (VITAMIN D) 1000 UNITS tablet Take 1,000 Units by mouth daily.    . famotidine (PEPCID) 40 MG tablet Take 1 tablet (40 mg total) by mouth daily. 90 tablet 3  . fish oil-omega-3 fatty acids 1000 MG capsule Take 1 g by mouth 2 (two) times daily  before a meal.    . fluticasone (FLONASE) 50 MCG/ACT nasal spray Place 2 sprays into both nostrils daily. 16 g 1  . fluticasone furoate-vilanterol (BREO ELLIPTA) 100-25 MCG/INH AEPB Inhale 1 puff into the lungs daily. 3 each 1  . hydrochlorothiazide (HYDRODIURIL) 25 MG tablet Take 1 tablet (25 mg total) by mouth daily. 90 tablet 1  . Lysine 500 MG TABS Take 1 tablet (500 mg total) by mouth 2 (two) times daily before a meal. 180 tablet 1  . Melatonin 5 MG TABS Take 1 tablet by mouth daily.     . ondansetron (ZOFRAN-ODT) 8 MG disintegrating tablet Take 1 tablet (8 mg total) by mouth every 6 (six) hours as needed for nausea or vomiting. 20 tablet 1  . RESTASIS 0.05 % ophthalmic emulsion Place 1 drop into both eyes 2 (two) times daily as needed.     . rosuvastatin (CRESTOR) 20 MG tablet Take 1 tablet (20 mg total) by mouth daily. 90 tablet 1  . traZODone (DESYREL) 50 MG tablet Take 1 tablet (50 mg total) by mouth at bedtime. 90 tablet 1  . valACYclovir (VALTREX) 500 MG tablet Take 1 tablet (500 mg total) by mouth daily. As needed 30 tablet 11   No current facility-administered medications on file prior to visit.     ROS Review of Systems  Constitutional: Negative.   HENT: Negative for congestion.   Eyes: Negative for visual disturbance.  Respiratory: Negative for shortness of breath.   Cardiovascular: Negative for chest pain.  Gastrointestinal: Negative for abdominal pain, constipation, diarrhea, nausea and vomiting.  Genitourinary: Negative for difficulty urinating.  Musculoskeletal: Negative for arthralgias and myalgias.  Neurological: Negative for headaches.  Psychiatric/Behavioral: Negative for sleep disturbance.    Objective:  There were no vitals taken for this visit.  BP Readings from Last 3 Encounters:  01/06/19 128/80  09/27/18 134/80  07/22/18 (!) 158/87    Wt Readings from Last 3 Encounters:  01/06/19 198 lb 12.8 oz (90.2 kg)  09/27/18 199 lb 6.4 oz (90.4 kg)  07/22/18 201 lb 12.8 oz (91.5 kg)     Physical Exam  Exam deferred. Pt. Harboring due to COVID 19. Phone visit performed.     Assessment & Plan:   Diagnoses and all orders for this visit:  Pulmonary emphysema, unspecified emphysema type ([place]) -     CBC with Differential/Platelet -     CMP14+EGFR -     TSH -     Referral to Chronic Care Management Services  Vitamin D deficiency -     CBC with Differential/Platelet -     CMP14+EGFR -     VITAMIN D 25 Hydroxy (Vit-D Deficiency,  Fractures) -     TSH -     Referral to Chronic Care Management Services  Mixed hyperlipidemia -     CBC with Differential/Platelet -     CMP14+EGFR -     Lipid panel -     TSH -     Referral to Chronic Care Management Services  Essential hypertension -     CBC with Differential/Platelet -     CMP14+EGFR -     TSH -     Referral to Chronic Care Management Services  GAD (generalized anxiety disorder) -     CBC with Differential/Platelet -     CMP14+EGFR -     TSH -     Referral to Chronic Care Management Services  Malignant neoplasm of breast in female, estrogen receptor positive, unspecified  laterality, unspecified site of breast [facility]) -     Referral to Chronic Care Management Services  Other orders -     risedronate (ACTONEL) 150 MG tablet; Take 1 tablet (150 mg total) by mouth every 30 (thirty) days. with water, on an empty stomach, take nothing by mouth and to not lie down for 30 minutes after each dose. -     meloxicam (MOBIC) 15 MG tablet; Take 1 tablet (15 mg total) by mouth daily. For joint and muscle pain   I am having Renee Maldonado start on meloxicam. I am also having her maintain her cholecalciferol, fish oil-omega-3 fatty acids, Calcium-Vitamin D-Vitamin K (CALCIUM + D + K PO), Restasis, Melatonin, aspirin, Aspirin-Caffeine, ondansetron, valACYclovir, albuterol, amLODipine, hydrochlorothiazide, Lysine, famotidine, traZODone, rosuvastatin, fluticasone furoate-vilanterol, celecoxib, fluticasone, and risedronate.  Meds ordered this encounter  Medications  . risedronate (ACTONEL) 150 MG tablet    Sig: Take 1 tablet (150 mg total) by mouth every 30 (thirty) days. with water, on an empty stomach, take nothing by mouth and to not lie down for 30 minutes after each dose.    Dispense:  3 tablet    Refill:  3  . meloxicam (MOBIC) 15 MG tablet    Sig: Take 1 tablet (15 mg total) by mouth daily. For joint and muscle pain    Dispense:  30 tablet    Refill:  5   Virtual Visit  via telephone Note  I discussed the limitations, risks, security and privacy concerns of performing an evaluation and management service by telephone and the availability of in person appointments. I also discussed with the patient that there may be a patient responsible charge related to this service. The patient expressed understanding and agreed to proceed. Pt. Is at home. Dr. Livia Snellen is in his office.  Follow Up Instructions:   I discussed the assessment and treatment plan with the patient. The patient was provided an opportunity to ask questions and all were answered. The patient agreed with the plan and demonstrated an understanding of the instructions.   The patient was advised to call back or seek an in-person evaluation if the symptoms worsen or if the condition fails to improve as anticipated.   Total minutes including chart review and phone contact time: 40   Follow-up: Return in about 6 weeks (around 08/11/2019), or pt prefers in office.Claretta Fraise, M.D.

## 2019-07-01 ENCOUNTER — Other Ambulatory Visit: Payer: Self-pay

## 2019-07-01 ENCOUNTER — Other Ambulatory Visit: Payer: Medicare Other

## 2019-07-01 ENCOUNTER — Telehealth: Payer: Self-pay | Admitting: *Deleted

## 2019-07-01 ENCOUNTER — Ambulatory Visit: Payer: Medicare Other | Admitting: Family Medicine

## 2019-07-01 NOTE — Telephone Encounter (Signed)
Insurance verification submitted for [employer]

## 2019-07-02 LAB — LIPID PANEL
Chol/HDL Ratio: 3.1 ratio (ref 0.0–4.4)
Cholesterol, Total: 138 mg/dL (ref 100–199)
HDL: 45 mg/dL (ref >39)
LDL Calculated: 56 mg/dL (ref 0–99)
Triglycerides: 185 mg/dL — ABNORMAL HIGH (ref 0–149)
VLDL Cholesterol Cal: 37 mg/dL (ref 5–40)

## 2019-07-02 LAB — CBC WITH DIFFERENTIAL/PLATELET
Basophils Absolute: 0 10*3/uL (ref 0.0–0.2)
Basos: 1 %
EOS (ABSOLUTE): 0.2 10*3/uL (ref 0.0–0.4)
Eos: 3 %
Hematocrit: 38.9 % (ref 34.0–46.6)
Hemoglobin: 12.3 g/dL (ref 11.1–15.9)
Immature Grans (Abs): 0 10*3/uL (ref 0.0–0.1)
Immature Granulocytes: 0 %
Lymphocytes Absolute: 1.3 10*3/uL (ref 0.7–3.1)
Lymphs: 24 %
MCH: 27.2 pg (ref 26.6–33.0)
MCHC: 31.6 g/dL (ref 31.5–35.7)
MCV: 86 fL (ref 79–97)
Monocytes Absolute: 0.5 10*3/uL (ref 0.1–0.9)
Monocytes: 9 %
Neutrophils Absolute: 3.4 10*3/uL (ref 1.4–7.0)
Neutrophils: 63 %
Platelets: 295 10*3/uL (ref 150–450)
RBC: 4.52 x10E6/uL (ref 3.77–5.28)
RDW: 13.3 % (ref 11.7–15.4)
WBC: 5.4 10*3/uL (ref 3.4–10.8)

## 2019-07-02 LAB — CMP14+EGFR
ALT: 16 IU/L (ref 0–32)
AST: 20 IU/L (ref 0–40)
Albumin/Globulin Ratio: 2.3 — ABNORMAL HIGH (ref 1.2–2.2)
Albumin: 4.4 g/dL (ref 3.7–4.7)
Alkaline Phosphatase: 89 IU/L (ref 39–117)
BUN/Creatinine Ratio: 13 (ref 12–28)
BUN: 16 mg/dL (ref 8–27)
Bilirubin Total: 0.3 mg/dL (ref 0.0–1.2)
CO2: 26 mmol/L (ref 20–29)
Calcium: 9.6 mg/dL (ref 8.7–10.3)
Chloride: 98 mmol/L (ref 96–106)
Creatinine, Ser: 1.21 mg/dL — ABNORMAL HIGH (ref 0.57–1.00)
GFR calc Af Amer: 51 mL/min/{1.73_m2} — ABNORMAL LOW (ref >59)
GFR calc non Af Amer: 44 mL/min/{1.73_m2} — ABNORMAL LOW (ref >59)
Globulin, Total: 1.9 g/dL (ref 1.5–4.5)
Glucose: 115 mg/dL — ABNORMAL HIGH (ref 65–99)
Potassium: 4.7 mmol/L (ref 3.5–5.2)
Sodium: 139 mmol/L (ref 134–144)
Total Protein: 6.3 g/dL (ref 6.0–8.5)

## 2019-07-02 LAB — VITAMIN D 25 HYDROXY (VIT D DEFICIENCY, FRACTURES): Vit D, 25-Hydroxy: 41.4 ng/mL (ref 30.0–100.0)

## 2019-07-02 LAB — TSH: TSH: 4.3 u[IU]/mL (ref 0.450–4.500)

## 2019-07-03 ENCOUNTER — Encounter: Payer: Self-pay | Admitting: Family Medicine

## 2019-07-06 ENCOUNTER — Telehealth: Payer: Self-pay | Admitting: Family Medicine

## 2019-07-06 NOTE — Telephone Encounter (Signed)
Pt notified lab results have not been reviewed Insurance verification for Prolia is not back

## 2019-07-07 ENCOUNTER — Encounter: Payer: Self-pay | Admitting: *Deleted

## 2019-07-08 ENCOUNTER — Ambulatory Visit: Payer: Self-pay | Admitting: Licensed Clinical Social Worker

## 2019-07-08 DIAGNOSIS — J441 Chronic obstructive pulmonary disease with (acute) exacerbation: Secondary | ICD-10-CM

## 2019-07-08 DIAGNOSIS — G4709 Other insomnia: Secondary | ICD-10-CM

## 2019-07-08 DIAGNOSIS — E782 Mixed hyperlipidemia: Secondary | ICD-10-CM

## 2019-07-08 DIAGNOSIS — K219 Gastro-esophageal reflux disease without esophagitis: Secondary | ICD-10-CM

## 2019-07-08 DIAGNOSIS — F411 Generalized anxiety disorder: Secondary | ICD-10-CM

## 2019-07-08 DIAGNOSIS — E559 Vitamin D deficiency, unspecified: Secondary | ICD-10-CM

## 2019-07-08 DIAGNOSIS — I10 Essential (primary) hypertension: Secondary | ICD-10-CM

## 2019-07-08 NOTE — Patient Instructions (Addendum)
Licensed Clinical Water engineer provided: No  I reached out to [place] , spouse of client by phone today.   LCSW spoke via phone with [name], spouse of client,  today and informed [name] that LCSW had received a referral for client. [name] said client was away from home and for LCSW to call client on her cell phone. LCSW called client cell phone number today and was not able to speak with client; however, LCSW did leave a phone message for Krissi Willaims today asking her to return call please to LCSW at [phone] to discuss CCM program services.   Follow Up Plan: LCSW to call client or her spouse in the next 3 weeks to talk with client or her spouse further about CCM program services  The patient Renee, Maldonado, verbalized understanding of instructions provided today and declined a print copy of patient instruction materials.   Norva Riffle.Jniya Madara MSW, LCSW Licensed Clinical Social Worker [facility] [phone]

## 2019-07-08 NOTE — Chronic Care Management (AMB) (Signed)
  Care Management Note   Renee Maldonado is a 73 y.o. year old female who is a primary care patient of Stacks, Cletus Gash, MD. The CM team was consulted for assistance with chronic disease management and care coordination.   I reached out to [place] , spouse of client by phone today.   Review of patient status, including review of consultants reports, relevant laboratory and other test results, and collaboration with appropriate care team members and the patient's provider was performed as part of comprehensive patient evaluation and provision of chronic care management services.   Social Determinants of Health: risk of tobacco use; risk for depression  LCSW spoke via phone with [name], spouse of client,  today and informed [name] that LCSW had received a referral for client. [name] said client was away from home and for LCSW to call client on her cell phone. LCSW called client cell phone number today and was not able to speak with client; however, LCSW did leave a phone message for [name] today asking her to return call please to LCSW at [phone] to discuss CCM program services.   Follow Up Plan: LCSW to call client or her spouse in next 3 weeks to talk with client or her spouse further about CCM program services  Norva Riffle.Dhamar Gregory MSW, LCSW Licensed Clinical Social Worker [facility] [phone]

## 2019-07-12 NOTE — Progress Notes (Signed)
Oncology is going to try to get Prolia approved

## 2019-07-13 ENCOUNTER — Telehealth: Payer: Self-pay | Admitting: Family Medicine

## 2019-07-13 NOTE — Telephone Encounter (Signed)
Patient would like for Dr. Livia Snellen to call her. States it is about her getting a Reclast injection tomorrow. Please contact patient.

## 2019-07-27 ENCOUNTER — Ambulatory Visit: Payer: Self-pay | Admitting: Licensed Clinical Social Worker

## 2019-07-27 DIAGNOSIS — K219 Gastro-esophageal reflux disease without esophagitis: Secondary | ICD-10-CM

## 2019-07-27 DIAGNOSIS — E559 Vitamin D deficiency, unspecified: Secondary | ICD-10-CM

## 2019-07-27 DIAGNOSIS — J441 Chronic obstructive pulmonary disease with (acute) exacerbation: Secondary | ICD-10-CM

## 2019-07-27 DIAGNOSIS — G4709 Other insomnia: Secondary | ICD-10-CM

## 2019-07-27 DIAGNOSIS — F411 Generalized anxiety disorder: Secondary | ICD-10-CM

## 2019-07-27 DIAGNOSIS — I10 Essential (primary) hypertension: Secondary | ICD-10-CM

## 2019-07-27 DIAGNOSIS — E782 Mixed hyperlipidemia: Secondary | ICD-10-CM

## 2019-07-27 NOTE — Patient Instructions (Addendum)
Licensed Clinical Social Worker Visit Information  Materials provided: No  [name] was given information about Chronic Care Management services today including:  1. CCM service includes personalized support from designated clinical staff supervised by her physician, including individualized plan of care and coordination with other care providers 2. 24/7 contact phone numbers for assistance for urgent and routine care needs. 3. Service will only be billed when office clinical staff spend 20 minutes or more in a month to coordinate care. 4. Only one practitioner may furnish and bill the service in a calendar month. 5. The patient may stop CCM services at any time (effective at the end of the month) by phone call to the office staff. 6. The patient will be responsible for cost sharing (co-pay) of up to 20% of the service fee (after annual deductible is met).  Patient did not agree to enrollment in care management services and does not wish to consider at this time.   LCSW spoke via phone with Renee Maldonado today regarding CCM program services. LCSW described to Renee Maldonado the role of RNCM and role of LCSW with CCM program services. Renee Maldonado said she was appreciative of information but does not wish to participate in CCM program at this time. LCSW thanked Renee Maldonado for phone call with LCSW on 07/27/2019  The patient verbalized understanding of instructions provided today and declined a print copy of patient instruction materials.   Renee Maldonado.Renee Maldonado MSW, LCSW Licensed Clinical Social Worker [facility] [phone]

## 2019-07-27 NOTE — Chronic Care Management (AMB) (Signed)
  Care Management Note   Renee Maldonado is a 73 y.o. year old female who is a primary care patient of Stacks, Cletus Gash, MD. The CM team was consulted for assistance with chronic disease management and care coordination.   I reached out to [employer] by phone today.   Renee Maldonado was given information about Chronic Care Management services today including:  1. CCM service includes personalized support from designated clinical staff supervised by her physician, including individualized plan of care and coordination with other care providers 2. 24/7 contact phone numbers for assistance for urgent and routine care needs. 3. Service will only be billed when office clinical staff spend 20 minutes or more in a month to coordinate care. 4. Only one practitioner may furnish and bill the service in a calendar month. 5. The patient may stop CCM services at any time (effective at the end of the month) by phone call to the office staff. 6. The patient will be responsible for cost sharing (co-pay) of up to 20% of the service fee (after annual deductible is met). Patient did not agree to enrollment in care management services and does not wish to consider at this time.   Review of patient status, including review of consultants reports, relevant laboratory and other test results, and collaboration with appropriate care team members and the patient's provider was performed as part of comprehensive patient evaluation and provision of chronic care management services.   LCSW spoke via phone with Renee Maldonado today regarding CCM program services. LCSW described to Renee Maldonado and role of LCSW with CCM program services. Renee Maldonado said she was appreciative of information but does not wish to participate in CCM program at this time. LCSW thanked Renee Maldonado for phone call with LCSW on 07/27/2019  Renee Maldonado.Renee Maldonado MSW, LCSW Licensed Clinical Social Worker [facility]  [phone]

## 2019-07-28 ENCOUNTER — Ambulatory Visit (INDEPENDENT_AMBULATORY_CARE_PROVIDER_SITE_OTHER): Payer: Medicare Other | Admitting: *Deleted

## 2019-07-28 DIAGNOSIS — Z1211 Encounter for screening for malignant neoplasm of colon: Secondary | ICD-10-CM | POA: Diagnosis not present

## 2019-07-28 DIAGNOSIS — Z0000 Encounter for general adult medical examination without abnormal findings: Secondary | ICD-10-CM

## 2019-07-28 NOTE — Progress Notes (Signed)
MEDICARE ANNUAL WELLNESS VISIT  07/28/2019  Telephone Visit Disclaimer This Medicare AWV was conducted by telephone due to national recommendations for restrictions regarding the COVID-19 Pandemic (e.g. social distancing).  I verified, using two identifiers, that I am speaking with [name] or their authorized healthcare agent. I discussed the limitations, risks, security, and privacy concerns of performing an evaluation and management service by telephone and the potential availability of an in-person appointment in the future. The patient expressed understanding and agreed to proceed.   Subjective:  Renee Maldonado is a 73 y.o. female patient of Stacks, Cletus Gash, MD who had a Medicare Annual Wellness Visit today via telephone. Renee Maldonado is Retired and lives with their spouse. she has 3 children. she reports that she is socially active and does interact with friends/family regularly. she is not physically active and enjoys reading and cross stitching.  Patient Care Team: Claretta Fraise, MD as PCP - General (Family Medicine) Al Corpus Marilynne Drivers, MD as Referring Physician (Orthopedic Surgery) Shea Evans Norva Riffle, LCSW as Social Worker (Licensed Clinical Social Worker)  Advanced Directives 07/28/2019 03/20/2015 12/04/2014  Does Patient Have a Medical Advance Directive? No No No  Would patient like information on creating a medical advance directive? No - Patient declined No - patient declined information Yes - Educational materials given    Hospital Utilization Over the Past 12 Months: # of hospitalizations or ER visits: 0 # of surgeries: 1  Review of Systems    Patient reports that her overall health is unchanged compared to last year.  History obtained from chart review  Patient Reported Readings (BP, Pulse, CBG, Weight, etc) none  Pain Assessment Pain : No/denies pain     Current Medications & Allergies (verified) Allergies as of 07/28/2019      Reactions   Benadryl [diphenhydramine  Hcl]    Penicillins    Had a rash around 20 years ago but unsure if it come from medicine.       Medication List       Accurate as of July 28, 2019  9:59 AM. If you have any questions, ask your nurse or doctor.        STOP taking these medications   aspirin 81 MG tablet   celecoxib 200 MG capsule Commonly known as: CeleBREX   fish oil-omega-3 fatty acids 1000 MG capsule   risedronate 150 MG tablet Commonly known as: Actonel     TAKE these medications   acetaminophen 500 MG tablet Commonly known as: TYLENOL Take by mouth.   albuterol 108 (90 Base) MCG/ACT inhaler Commonly known as: VENTOLIN HFA Inhale 2 puffs into the lungs every 6 (six) hours as needed for wheezing or shortness of breath.   amLODipine 5 MG tablet Commonly known as: NORVASC Take 1 tablet (5 mg total) by mouth daily.   anastrozole 1 MG tablet Commonly known as: ARIMIDEX   [employer] Pain Ex St 500-32.5 MG Tabs Generic drug: Aspirin-Caffeine Take 1 tablet by mouth daily as needed.   CALCIUM + D + K PO Take 1 tablet by mouth 2 (two) times daily before a meal.   cholecalciferol 1000 units tablet Commonly known as: VITAMIN D Take 1,000 Units by mouth daily.   famotidine 40 MG tablet Commonly known as: Pepcid Take 1 tablet (40 mg total) by mouth daily.   fluticasone 50 MCG/ACT nasal spray Commonly known as: FLONASE Place 2 sprays into both nostrils daily.   fluticasone furoate-vilanterol 100-25 MCG/INH Aepb Commonly known as:  Breo Ellipta Inhale 1 puff into the lungs daily.   hydrochlorothiazide 25 MG tablet Commonly known as: HYDRODIURIL Take 1 tablet (25 mg total) by mouth daily.   Lysine 500 MG Tabs Take 1 tablet (500 mg total) by mouth 2 (two) times daily before a meal.   Melatonin 5 MG Tabs Take 1 tablet by mouth daily.   meloxicam 15 MG tablet Commonly known as: MOBIC Take 1 tablet (15 mg total) by mouth daily. For joint and muscle pain   ondansetron 8 MG  disintegrating tablet Commonly known as: ZOFRAN-ODT Take 1 tablet (8 mg total) by mouth every 6 (six) hours as needed for nausea or vomiting.   RECLAST IV Inject into the vein.   Restasis 0.05 % ophthalmic emulsion Generic drug: cycloSPORINE Place 1 drop into both eyes 2 (two) times daily as needed.   rosuvastatin 20 MG tablet Commonly known as: CRESTOR Take 1 tablet (20 mg total) by mouth daily.   traZODone 50 MG tablet Commonly known as: DESYREL Take 1 tablet (50 mg total) by mouth at bedtime.   valACYclovir 500 MG tablet Commonly known as: Valtrex Take 1 tablet (500 mg total) by mouth daily. As needed       History (reviewed): Past Medical History:  Diagnosis Date  . Anxiety   . GERD (gastroesophageal reflux disease)   . Humerus fracture 2007  . Hyperlipidemia   . Hypertension   . Insomnia    Past Surgical History:  Procedure Laterality Date  . BREAST LUMPECTOMY Right 02/2019  . BREAST SURGERY Right 01/2019   breast biopsy  . JOINT REPLACEMENT     left knee  . Left shoulder surgery     2007  . TOTAL KNEE ARTHROPLASTY     2012 left  . TUBAL LIGATION     Family History  Problem Relation Age of Onset  . Alzheimer's disease Mother   . Diabetes Mother   . Stroke Father   . Hypertension Father   . Cancer Brother    Social History   Socioeconomic History  . Marital status: Married    Spouse name: Jori Moll  . Number of children: 3  . Years of education: 16  . Highest education level: Bachelor's degree (e.g., BA, AB, BS)  Occupational History  . Occupation: retired  Scientific laboratory technician  . Financial resource strain: Not hard at all  . Food insecurity    Worry: Never true    Inability: Never true  . Transportation needs    Medical: No    Non-medical: No  Tobacco Use  . Smoking status: Current Every Day Smoker    Packs/day: 0.25    Years: 40.00    Pack years: 10.00    Types: Cigarettes    Last attempt to quit: 09/17/2014    Years since quitting: 4.8  .  Smokeless tobacco: Never Used  . Tobacco comment: electronic cigarettes x 2 weeks. She was smoking 7 cigarettes a day x 40 yrs  Substance and Sexual Activity  . Alcohol use: Yes    Alcohol/week: 0.0 standard drinks    Comment: rarely-  . Drug use: No  . Sexual activity: Not Currently  Lifestyle  . Physical activity    Days per week: 0 days    Minutes per session: 0 min  . Stress: Not at all  Relationships  . Social connections    Talks on phone: More than three times a week    Gets together: More than three times a week  Attends religious service: More than 4 times per year    Active member of club or organization: Yes    Attends meetings of clubs or organizations: More than 4 times per year    Relationship status: Married  Other Topics Concern  . Not on file  Social History Narrative  . Not on file    Activities of Daily Living In your present state of health, do you have any difficulty performing the following activities: 07/28/2019  Hearing? Y  Comment pt states she feels like her hearing isn't as good as it used to be but she had her hearing checked by a specialist 2 years ago and was told it was normal  Vision? Y  Comment has cataracts, retina problem-is following her eye doctor in [place]  Difficulty concentrating or making decisions? N  Walking or climbing stairs? Y  Comment has to be very careful when going up and down steps as she gets SOB  Dressing or bathing? N  Doing errands, shopping? N  Preparing Food and eating ? N  Using the Toilet? N  In the past six months, have you accidently leaked urine? Y  Comment wears pantyliners all the time  Do you have problems with loss of bowel control? N  Managing your Medications? N  Managing your Finances? N  Housekeeping or managing your Housekeeping? N  Some recent data might be hidden    Patient Education/ Literacy How often do you need to have someone help you when you read instructions, pamphlets, or other  written materials from your doctor or pharmacy?: 1 - Never What is the last grade level you completed in school?: Bachelors Degree-Elementary Education  Exercise Current Exercise Habits: The patient does not participate in regular exercise at present, Exercise limited by: orthopedic condition(s);respiratory conditions(s)  Diet Patient reports consuming 3 meals a day and 2 snack(s) a day Patient reports that her primary diet is: Regular Patient reports that she does have regular access to food.   Depression Screen PHQ 2/9 Scores 07/28/2019 01/06/2019 09/27/2018 07/22/2018 02/08/2018 07/23/2017 01/20/2017  PHQ - 2 Score 0 0 0 0 0 0 0     Fall Risk Fall Risk  07/28/2019 01/06/2019 09/27/2018 07/22/2018 02/08/2018  Falls in the past year? 0 0 0 No No  Number falls in past yr: 0 - 0 - -  Injury with Fall? 0 - - - -  Follow up Falls prevention discussed - - - -  Comment get rid of all throw rugs in the house, adequate lighting in the walkways and grab bars in the bathroom - - - -     Objective:  Renee Maldonado seemed alert and oriented and she participated appropriately during our telephone visit.  Blood Pressure Weight BMI  BP Readings from Last 3 Encounters:  01/06/19 128/80  09/27/18 134/80  07/22/18 (!) 158/87   Wt Readings from Last 3 Encounters:  01/06/19 198 lb 12.8 oz (90.2 kg)  09/27/18 199 lb 6.4 oz (90.4 kg)  07/22/18 201 lb 12.8 oz (91.5 kg)   BMI Readings from Last 1 Encounters:  01/06/19 37.56 kg/m    *Unable to obtain current vital signs, weight, and BMI due to telephone visit type  Hearing/Vision  . Renee Maldonado did not seem to have difficulty with hearing/understanding during the telephone conversation . Reports that she has had a formal eye exam by an eye care professional within the past year . Reports that she has not had a formal hearing evaluation within the  past year *Unable to fully assess hearing and vision during telephone visit type  Cognitive Function: 6CIT Screen  07/28/2019  What Year? 0 points  What month? 0 points  What time? 0 points  Count back from 20 0 points  Months in reverse 0 points  Repeat phrase 0 points  Total Score 0   (Normal:0-7, Significant for Dysfunction: >8)  Normal Cognitive Function Screening: Yes   Immunization & Health Maintenance Record Immunization History  Administered Date(s) Administered  . Fluad Quad(high Dose 65+) 07/20/2019  . H1N1 10/24/2008  . Influenza Nasal 07/19/2011  . Influenza Split 07/28/2013  . Influenza,inj,Quad PF,6+ Mos 08/30/2018  . Influenza,inj,quad, With Preservative 08/24/2017  . Influenza-Unspecified 08/23/2014, 08/07/2015, 08/15/2016, 08/30/2018, 07/20/2019  . Pneumococcal Conjugate-13 08/23/2014  . Pneumococcal Polysaccharide-23 08/30/2015  . Tdap 06/29/2013    Health Maintenance  Topic Date Due  . COLON CANCER SCREENING ANNUAL FOBT  04/29/2017  . MAMMOGRAM  02/09/2020  . DEXA SCAN  02/09/2020  . COLONOSCOPY  01/09/2021  . TETANUS/TDAP  06/30/2023  . INFLUENZA VACCINE  Completed  . Hepatitis C Screening  Completed  . PNA vac Low Risk Adult  Completed       Assessment  This is a routine wellness examination for Renee Maldonado.  Health Maintenance: Due or Overdue Health Maintenance Due  Topic Date Due  . COLON CANCER SCREENING ANNUAL FOBT  04/29/2017    Renee Maldonado does not need a referral for Community Assistance: Care Management:   no Social Work:    no Prescription Assistance:  no Nutrition/Diabetes Education:  no   Plan:  Personalized Goals Goals Addressed            This Visit's Progress   . DIET - INCREASE WATER INTAKE       Try to drink 6-8 glasses of water daily.      Personalized Health Maintenance & Screening Recommendations  Colorectal cancer screening  Lung Cancer Screening Recommended: no (Low Dose CT Chest recommended if Age 31-80 years, 30 pack-year currently smoking OR have quit w/in past 15 years) Hepatitis C Screening recommended: no  HIV Screening recommended: no  Advanced Directives: Written information was not prepared per patient's request.  Referrals & Orders No orders of the defined types were placed in this encounter.   Follow-up Plan . Follow-up with Claretta Fraise, MD as planned . Pick up the FOBT card as discussed    I have personally reviewed and noted the following in the patient's chart:   . Medical and social history . Use of alcohol, tobacco or illicit drugs  . Current medications and supplements . Functional ability and status . Nutritional status . Physical activity . Advanced directives . List of other physicians . Hospitalizations, surgeries, and ER visits in previous 12 months . Vitals . Screenings to include cognitive, depression, and falls . Referrals and appointments  In addition, I have reviewed and discussed with Renee Maldonado certain preventive protocols, quality metrics, and best practice recommendations. A written personalized care plan for preventive services as well as general preventive health recommendations is available and can be mailed to the patient at her request.      Renee Crosby, LPN  9/32/[id number]

## 2019-07-28 NOTE — Patient Instructions (Signed)
Preventive Care 38 Years and Older, Female Preventive care refers to lifestyle choices and visits with your health care provider that can promote health and wellness. This includes:  A yearly physical exam. This is also called an annual well check.  Regular dental and eye exams.  Immunizations.  Screening for certain conditions.  Healthy lifestyle choices, such as diet and exercise. What can I expect for my preventive care visit? Physical exam Your health care provider will check:  Height and weight. These may be used to calculate body mass index (BMI), which is a measurement that tells if you are at a healthy weight.  Heart rate and blood pressure.  Your skin for abnormal spots. Counseling Your health care provider may ask you questions about:  Alcohol, tobacco, and drug use.  Emotional well-being.  Home and relationship well-being.  Sexual activity.  Eating habits.  History of falls.  Memory and ability to understand (cognition).  Work and work Statistician.  Pregnancy and menstrual history. What immunizations do I need?  Influenza (flu) vaccine  This is recommended every year. Tetanus, diphtheria, and pertussis (Tdap) vaccine  You may need a Td booster every 10 years. Varicella (chickenpox) vaccine  You may need this vaccine if you have not already been vaccinated. Zoster (shingles) vaccine  You may need this after age 33. Pneumococcal conjugate (PCV13) vaccine  One dose is recommended after age 33. Pneumococcal polysaccharide (PPSV23) vaccine  One dose is recommended after age 72. Measles, mumps, and rubella (MMR) vaccine  You may need at least one dose of MMR if you were born in 1957 or later. You may also need a second dose. Meningococcal conjugate (MenACWY) vaccine  You may need this if you have certain conditions. Hepatitis A vaccine  You may need this if you have certain conditions or if you travel or work in places where you may be exposed  to hepatitis A. Hepatitis B vaccine  You may need this if you have certain conditions or if you travel or work in places where you may be exposed to hepatitis B. Haemophilus influenzae type b (Hib) vaccine  You may need this if you have certain conditions. You may receive vaccines as individual doses or as more than one vaccine together in one shot (combination vaccines). Talk with your health care provider about the risks and benefits of combination vaccines. What tests do I need? Blood tests  Lipid and cholesterol levels. These may be checked every 5 years, or more frequently depending on your overall health.  Hepatitis C test.  Hepatitis B test. Screening  Lung cancer screening. You may have this screening every year starting at age 39 if you have a 30-pack-year history of smoking and currently smoke or have quit within the past 15 years.  Colorectal cancer screening. All adults should have this screening starting at age 36 and continuing until age 15. Your health care provider may recommend screening at age 23 if you are at increased risk. You will have tests every 1-10 years, depending on your results and the type of screening test.  Diabetes screening. This is done by checking your blood sugar (glucose) after you have not eaten for a while (fasting). You may have this done every 1-3 years.  Mammogram. This may be done every 1-2 years. Talk with your health care provider about how often you should have regular mammograms.  BRCA-related cancer screening. This may be done if you have a family history of breast, ovarian, tubal, or peritoneal cancers.  Other tests  Sexually transmitted disease (STD) testing.  Bone density scan. This is done to screen for osteoporosis. You may have this done starting at age 76. Follow these instructions at home: Eating and drinking  Eat a diet that includes fresh fruits and vegetables, whole grains, lean protein, and low-fat dairy products. Limit  your intake of foods with high amounts of sugar, saturated fats, and salt.  Take vitamin and mineral supplements as recommended by your health care provider.  Do not drink alcohol if your health care provider tells you not to drink.  If you drink alcohol: ? Limit how much you have to 0-1 drink a day. ? Be aware of how much alcohol is in your drink. In the [place] one drink equals one 12 oz bottle of beer (355 mL), one 5 oz glass of wine (148 mL), or one 1 oz glass of hard liquor (44 mL). Lifestyle  Take daily care of your teeth and gums.  Stay active. Exercise for at least 30 minutes on 5 or more days each week.  Do not use any products that contain nicotine or tobacco, such as cigarettes, e-cigarettes, and chewing tobacco. If you need help quitting, ask your health care provider.  If you are sexually active, practice safe sex. Use a condom or other form of protection in order to prevent STIs (sexually transmitted infections).  Talk with your health care provider about taking a low-dose aspirin or statin. What's next?  Go to your health care provider once a year for a well check visit.  Ask your health care provider how often you should have your eyes and teeth checked.  Stay up to date on all vaccines. This information is not intended to replace advice given to you by your health care provider. Make sure you discuss any questions you have with your health care provider. Document Released: 11/30/2015 Document Revised: 10/28/2018 Document Reviewed: 10/28/2018 Elsevier Patient Education  2020 [employer].

## 2019-08-23 ENCOUNTER — Other Ambulatory Visit: Payer: Self-pay

## 2019-08-23 DIAGNOSIS — Z20822 Contact with and (suspected) exposure to covid-19: Secondary | ICD-10-CM

## 2019-08-25 LAB — NOVEL CORONAVIRUS, NAA: SARS-CoV-2, NAA: NOT DETECTED

## 2019-08-31 ENCOUNTER — Telehealth: Payer: Self-pay | Admitting: Family Medicine

## 2019-08-31 NOTE — Telephone Encounter (Signed)
Patient called in and received her covid test result  °

## 2019-09-26 ENCOUNTER — Other Ambulatory Visit: Payer: Self-pay | Admitting: Family Medicine

## 2019-10-10 ENCOUNTER — Other Ambulatory Visit: Payer: Self-pay | Admitting: Family Medicine

## 2019-11-07 ENCOUNTER — Other Ambulatory Visit: Payer: Self-pay | Admitting: Family Medicine

## 2019-11-15 ENCOUNTER — Other Ambulatory Visit: Payer: Self-pay | Admitting: Family Medicine

## 2019-11-28 ENCOUNTER — Telehealth: Payer: Self-pay | Admitting: Family Medicine

## 2019-11-28 NOTE — Telephone Encounter (Signed)
Appt made for follow up

## 2019-11-29 ENCOUNTER — Other Ambulatory Visit: Payer: Self-pay | Admitting: Family Medicine

## 2019-11-29 DIAGNOSIS — I10 Essential (primary) hypertension: Secondary | ICD-10-CM

## 2019-12-13 ENCOUNTER — Other Ambulatory Visit: Payer: Self-pay

## 2019-12-14 ENCOUNTER — Encounter: Payer: Self-pay | Admitting: Family Medicine

## 2019-12-14 ENCOUNTER — Ambulatory Visit (INDEPENDENT_AMBULATORY_CARE_PROVIDER_SITE_OTHER): Payer: Medicare HMO | Admitting: Family Medicine

## 2019-12-14 VITALS — BP 126/69 | HR 73 | Temp 97.4°F | Ht 61.0 in | Wt 200.0 lb

## 2019-12-14 DIAGNOSIS — E559 Vitamin D deficiency, unspecified: Secondary | ICD-10-CM | POA: Diagnosis not present

## 2019-12-14 DIAGNOSIS — E785 Hyperlipidemia, unspecified: Secondary | ICD-10-CM | POA: Diagnosis not present

## 2019-12-14 DIAGNOSIS — I10 Essential (primary) hypertension: Secondary | ICD-10-CM

## 2019-12-14 DIAGNOSIS — G4709 Other insomnia: Secondary | ICD-10-CM

## 2019-12-14 DIAGNOSIS — E782 Mixed hyperlipidemia: Secondary | ICD-10-CM

## 2019-12-14 DIAGNOSIS — N3281 Overactive bladder: Secondary | ICD-10-CM

## 2019-12-14 DIAGNOSIS — J439 Emphysema, unspecified: Secondary | ICD-10-CM | POA: Diagnosis not present

## 2019-12-14 MED ORDER — BREO ELLIPTA 100-25 MCG/INH IN AEPB: INHALATION_SPRAY | RESPIRATORY_TRACT | 3 refills | Status: DC

## 2019-12-14 MED ORDER — VALACYCLOVIR HCL 500 MG PO TABS: ORAL_TABLET | ORAL | 2 refills | Status: DC

## 2019-12-14 MED ORDER — MIRABEGRON ER 25 MG PO TB24: 25.0000 mg | ORAL_TABLET | Freq: Every day | ORAL | 5 refills | Status: DC

## 2019-12-14 MED ORDER — AMLODIPINE BESYLATE 5 MG PO TABS: 5.0000 mg | ORAL_TABLET | Freq: Every day | ORAL | 0 refills | Status: DC

## 2019-12-14 MED ORDER — ROSUVASTATIN CALCIUM 20 MG PO TABS: 20.0000 mg | ORAL_TABLET | Freq: Every day | ORAL | 1 refills | Status: DC

## 2019-12-14 MED ORDER — MELATONIN 5 MG PO TABS: 2.0000 | ORAL_TABLET | Freq: Every day | ORAL | 3 refills | Status: DC

## 2019-12-14 MED ORDER — TRAZODONE HCL 50 MG PO TABS: 50.0000 mg | ORAL_TABLET | Freq: Every day | ORAL | 1 refills | Status: DC

## 2019-12-14 MED ORDER — HYDROCHLOROTHIAZIDE 12.5 MG PO TABS: 25.0000 mg | ORAL_TABLET | Freq: Every day | ORAL | 1 refills | Status: DC

## 2019-12-14 NOTE — Progress Notes (Signed)
Subjective:  Patient ID: Renee Maldonado, female    DOB: 1946-09-10  Age: 74 y.o. MRN: [id number]  CC: Hypertension and Hyperlipidemia   HPI Ceira Hoeschen presents for  follow-up of hypertension. Patient has no history of headache chest pain or shortness of breath or recent cough. Patient also denies symptoms of TIA such as focal numbness or weakness. Patient denies side effects from medication. States taking it regularly. Patient in for follow-up of elevated cholesterol. Doing well without complaints on current medication. Denies side effects of statin including myalgia and arthralgia and nausea. Also in today for liver function testing. Currently no chest pain, shortness of breath or other cardiovascular related symptoms noted.  Patient states that the Mobic for her knees is ineffective.  She says the pain is 6-8/10.  She gets about as much relief from Tylenol.  She does not want to try anything new for now.  She wants to consider a steroid injection and will return in a couple of weeks for that if she decides on it.  Patient states that she is having frequency and nocturia every couple of hours all day and all night.  She really needs some sleep.   History Lourdez has a past medical history of Anxiety, GERD (gastroesophageal reflux disease), Humerus fracture (2007), Hyperlipidemia, Hypertension, and Insomnia.   She has a past surgical history that includes Total knee arthroplasty; Left shoulder surgery; Joint replacement; Tubal ligation; Breast surgery (Right, 01/2019); and Breast lumpectomy (Right, 02/2019).   Her family history includes Alzheimer's disease in her mother; Cancer in her brother; Diabetes in her mother; Hypertension in her father; Stroke in her father.She reports that she has been smoking cigarettes. She has a 10.00 pack-year smoking history. She has never used smokeless tobacco. She reports current alcohol use. She reports that she does not use drugs.  Current Outpatient  Medications on File Prior to Visit  Medication Sig Dispense Refill   acetaminophen (TYLENOL) 500 MG tablet Take by mouth.     albuterol (PROVENTIL HFA;VENTOLIN HFA) 108 (90 Base) MCG/ACT inhaler Inhale 2 puffs into the lungs every 6 (six) hours as needed for wheezing or shortness of breath. 1 Inhaler 3   anastrozole (ARIMIDEX) 1 MG tablet      Aspirin-Caffeine (BAYER BACK & BODY PAIN EX ST) 500-32.5 MG TABS Take 1 tablet by mouth daily as needed.     Calcium-Vitamin D-Vitamin K (CALCIUM + D + K PO) Take 1 tablet by mouth 2 (two) times daily before a meal.      cholecalciferol (VITAMIN D) 1000 UNITS tablet Take 1,000 Units by mouth daily.     clindamycin (CLEOCIN) 300 MG capsule Take 300 mg by mouth 2 (two) times daily. Before Dental procedure     famotidine (PEPCID) 40 MG tablet TAKE 1 TABLET DAILY 90 tablet 0   fluticasone (FLONASE) 50 MCG/ACT nasal spray Place 2 sprays into both nostrils daily. 16 g 1   Lysine 500 MG TABS Take 1 tablet (500 mg total) by mouth 2 (two) times daily before a meal. 180 tablet 1   ondansetron (ZOFRAN-ODT) 8 MG disintegrating tablet Take 1 tablet (8 mg total) by mouth every 6 (six) hours as needed for nausea or vomiting. 20 tablet 1   RESTASIS 0.05 % ophthalmic emulsion Place 1 drop into both eyes 2 (two) times daily as needed.      Zoledronic Acid (RECLAST IV) Inject into the vein.     No current facility-administered medications on file prior to visit.  ROS Review of Systems  Constitutional: Negative.   HENT: Negative for congestion.   Eyes: Negative for visual disturbance.  Respiratory: Negative for shortness of breath.   Cardiovascular: Negative for chest pain.  Gastrointestinal: Negative for abdominal pain, constipation, diarrhea, nausea and vomiting.  Genitourinary: Negative for difficulty urinating.  Musculoskeletal: Positive for arthralgias. Negative for myalgias.  Neurological: Negative for headaches.  Psychiatric/Behavioral: Positive  for sleep disturbance.    Objective:  BP 126/69    Pulse 73    Temp (!) 97.4 F (36.3 C) (Temporal)    Ht _0  (1.549 m)    Wt 200 lb (90.7 kg)    SpO2 97%    BMI 37.79 kg/m   BP Readings from Last 3 Encounters:  12/14/19 126/69  01/06/19 128/80  09/27/18 134/80    Wt Readings from Last 3 Encounters:  12/14/19 200 lb (90.7 kg)  01/06/19 198 lb 12.8 oz (90.2 kg)  09/27/18 199 lb 6.4 oz (90.4 kg)     Physical Exam Constitutional:      General: She is not in acute distress.    Appearance: She is well-developed.  HENT:     Head: Normocephalic and atraumatic.     Right Ear: External ear normal.     Left Ear: External ear normal.     Nose: Nose normal.  Eyes:     Conjunctiva/sclera: Conjunctivae normal.     Pupils: Pupils are equal, round, and reactive to light.  Neck:     Thyroid: No thyromegaly.  Cardiovascular:     Rate and Rhythm: Normal rate and regular rhythm.     Heart sounds: Normal heart sounds. No murmur.  Pulmonary:     Effort: Pulmonary effort is normal. No respiratory distress.     Breath sounds: Normal breath sounds. No wheezing or rales.  Abdominal:     General: Bowel sounds are normal. There is no distension.     Palpations: Abdomen is soft.     Tenderness: There is no abdominal tenderness.  Musculoskeletal:        General: Tenderness (Anterior aspect right knee) present.     Cervical back: Normal range of motion and neck supple.  Lymphadenopathy:     Cervical: No cervical adenopathy.  Skin:    General: Skin is warm and dry.  Neurological:     Mental Status: She is alert and oriented to person, place, and time.     Deep Tendon Reflexes: Reflexes are normal and symmetric.  Psychiatric:        Behavior: Behavior normal.        Thought Content: Thought content normal.        Judgment: Judgment normal.       Assessment & Plan:   Heela was seen today for hypertension and hyperlipidemia.  Diagnoses and all orders for this visit:  Overactive  bladder -     CBC with Differential/Platelet -     CMP14+EGFR -     Urinalysis -     mirabegron ER (MYRBETRIQ) 25 MG TB24 tablet; Take 1 tablet (25 mg total) by mouth daily.  Mixed hyperlipidemia -     CBC with Differential/Platelet -     CMP14+EGFR -     Lipid panel  Essential hypertension -     CBC with Differential/Platelet -     CMP14+EGFR -     Lipid panel -     Urinalysis -     fluticasone furoate-vilanterol (BREO ELLIPTA) 100-25 MCG/INH AEPB; INHALE 1 PUFF  ONCE DAILY -     hydrochlorothiazide (HYDRODIURIL) 12.5 MG tablet; Take 2 tablets (25 mg total) by mouth daily.  Pulmonary emphysema, unspecified emphysema type ([place]) -     CBC with Differential/Platelet -     CMP14+EGFR  Vitamin D deficiency -     VITAMIN D 25 Hydroxy (Vit-D Deficiency, Fractures)  Hyperlipidemia, unspecified hyperlipidemia type -     rosuvastatin (CRESTOR) 20 MG tablet; Take 1 tablet (20 mg total) by mouth daily.  Other insomnia -     Melatonin 5 MG TABS; Take 2 tablets (10 mg total) by mouth daily. -     traZODone (DESYREL) 50 MG tablet; Take 1 tablet (50 mg total) by mouth at bedtime.  Other orders -     amLODipine (NORVASC) 5 MG tablet; Take 1 tablet (5 mg total) by mouth daily. -     valACYclovir (VALTREX) 500 MG tablet; TAKE (1) TABLET DAILY AS NEEDED.   Allergies as of 12/14/2019      Reactions   Benadryl [diphenhydramine Hcl]    Penicillins    Had a rash around 20 years ago but unsure if it come from medicine.       Medication List       Accurate as of December 14, 2019  6:31 PM. If you have any questions, ask your nurse or doctor.        STOP taking these medications   meloxicam 15 MG tablet Commonly known as: MOBIC Stopped by: Claretta Fraise, MD     TAKE these medications   acetaminophen 500 MG tablet Commonly known as: TYLENOL Take by mouth.   albuterol 108 (90 Base) MCG/ACT inhaler Commonly known as: VENTOLIN HFA Inhale 2 puffs into the lungs every 6 (six) hours as  needed for wheezing or shortness of breath.   amLODipine 5 MG tablet Commonly known as: NORVASC Take 1 tablet (5 mg total) by mouth daily.   anastrozole 1 MG tablet Commonly known as: ARIMIDEX   Bayer Back & Body Pain Ex St 500-32.5 MG Tabs Generic drug: Aspirin-Caffeine Take 1 tablet by mouth daily as needed.   Breo Ellipta 100-25 MCG/INH Aepb Generic drug: fluticasone furoate-vilanterol INHALE 1 PUFF ONCE DAILY   CALCIUM + D + K PO Take 1 tablet by mouth 2 (two) times daily before a meal.   cholecalciferol 1000 units tablet Commonly known as: VITAMIN D Take 1,000 Units by mouth daily.   clindamycin 300 MG capsule Commonly known as: CLEOCIN Take 300 mg by mouth 2 (two) times daily. Before Dental procedure   famotidine 40 MG tablet Commonly known as: PEPCID TAKE 1 TABLET DAILY   fluticasone 50 MCG/ACT nasal spray Commonly known as: FLONASE Place 2 sprays into both nostrils daily.   hydrochlorothiazide 12.5 MG tablet Commonly known as: HYDRODIURIL Take 2 tablets (25 mg total) by mouth daily. What changed: medication strength Changed by: Claretta Fraise, MD   Lysine 500 MG Tabs Take 1 tablet (500 mg total) by mouth 2 (two) times daily before a meal.   Melatonin 5 MG Tabs Take 2 tablets (10 mg total) by mouth daily. What changed: how much to take Changed by: Claretta Fraise, MD   mirabegron ER 25 MG Tb24 tablet Commonly known as: Myrbetriq Take 1 tablet (25 mg total) by mouth daily. Started by: Claretta Fraise, MD   ondansetron 8 MG disintegrating tablet Commonly known as: ZOFRAN-ODT Take 1 tablet (8 mg total) by mouth every 6 (six) hours as needed for nausea or vomiting.  RECLAST IV Inject into the vein.   Restasis 0.05 % ophthalmic emulsion Generic drug: cycloSPORINE Place 1 drop into both eyes 2 (two) times daily as needed.   rosuvastatin 20 MG tablet Commonly known as: CRESTOR Take 1 tablet (20 mg total) by mouth daily.   traZODone 50 MG  tablet Commonly known as: DESYREL Take 1 tablet (50 mg total) by mouth at bedtime.   valACYclovir 500 MG tablet Commonly known as: VALTREX TAKE (1) TABLET DAILY AS NEEDED.       Meds ordered this encounter  Medications   mirabegron ER (MYRBETRIQ) 25 MG TB24 tablet    Sig: Take 1 tablet (25 mg total) by mouth daily.    Dispense:  30 tablet    Refill:  5   amLODipine (NORVASC) 5 MG tablet    Sig: Take 1 tablet (5 mg total) by mouth daily.    Dispense:  90 tablet    Refill:  0   fluticasone furoate-vilanterol (BREO ELLIPTA) 100-25 MCG/INH AEPB    Sig: INHALE 1 PUFF ONCE DAILY    Dispense:  90 each    Refill:  3   hydrochlorothiazide (HYDRODIURIL) 12.5 MG tablet    Sig: Take 2 tablets (25 mg total) by mouth daily.    Dispense:  90 tablet    Refill:  1   Melatonin 5 MG TABS    Sig: Take 2 tablets (10 mg total) by mouth daily.    Dispense:  180 tablet    Refill:  3   rosuvastatin (CRESTOR) 20 MG tablet    Sig: Take 1 tablet (20 mg total) by mouth daily.    Dispense:  90 tablet    Refill:  1    $   traZODone (DESYREL) 50 MG tablet    Sig: Take 1 tablet (50 mg total) by mouth at bedtime.    Dispense:  90 tablet    Refill:  1    $   valACYclovir (VALTREX) 500 MG tablet    Sig: TAKE (1) TABLET DAILY AS NEEDED.    Dispense:  30 tablet    Refill:  2    Of note is that the patient had her Reclast shot recently.  That brings her up-to-date for osteopenia.  With regard to her nocturia Myrbetriq should be helpful.  It is questionable whether it is clinically affordable though or not.  She is stable with regard to her cholesterol hypertension and other chronic diagnoses.  She does intend to increase melatonin and try to avoid taking trazodone.  She does not want a replacement for the Mobic at this time.  She will let me know and take the cortisone shot if the pain worsens. Follow-up: No follow-ups on file.  Claretta Fraise, M.D.

## 2019-12-15 LAB — CBC WITH DIFFERENTIAL/PLATELET
Basophils Absolute: 0 10*3/uL (ref 0.0–0.2)
Basos: 1 %
EOS (ABSOLUTE): 0.2 10*3/uL (ref 0.0–0.4)
Eos: 3 %
Hematocrit: 38.2 % (ref 34.0–46.6)
Hemoglobin: 12.6 g/dL (ref 11.1–15.9)
Immature Grans (Abs): 0 10*3/uL (ref 0.0–0.1)
Immature Granulocytes: 1 %
Lymphocytes Absolute: 1.2 10*3/uL (ref 0.7–3.1)
Lymphs: 19 %
MCH: 27.5 pg (ref 26.6–33.0)
MCHC: 33 g/dL (ref 31.5–35.7)
MCV: 83 fL (ref 79–97)
Monocytes Absolute: 0.4 10*3/uL (ref 0.1–0.9)
Monocytes: 7 %
Neutrophils Absolute: 4.4 10*3/uL (ref 1.4–7.0)
Neutrophils: 69 %
Platelets: 294 10*3/uL (ref 150–450)
RBC: 4.58 x10E6/uL (ref 3.77–5.28)
RDW: 13.3 % (ref 11.7–15.4)
WBC: 6.2 10*3/uL (ref 3.4–10.8)

## 2019-12-15 LAB — CMP14+EGFR
ALT: 20 IU/L (ref 0–32)
AST: 18 IU/L (ref 0–40)
Albumin/Globulin Ratio: 2.5 — ABNORMAL HIGH (ref 1.2–2.2)
Albumin: 4.5 g/dL (ref 3.7–4.7)
Alkaline Phosphatase: 97 IU/L (ref 39–117)
BUN/Creatinine Ratio: 13 (ref 12–28)
BUN: 14 mg/dL (ref 8–27)
Bilirubin Total: 0.3 mg/dL (ref 0.0–1.2)
CO2: 24 mmol/L (ref 20–29)
Calcium: 9.8 mg/dL (ref 8.7–10.3)
Chloride: 104 mmol/L (ref 96–106)
Creatinine, Ser: 1.05 mg/dL — ABNORMAL HIGH (ref 0.57–1.00)
GFR calc Af Amer: 60 mL/min/{1.73_m2} (ref >59)
GFR calc non Af Amer: 52 mL/min/{1.73_m2} — ABNORMAL LOW (ref >59)
Globulin, Total: 1.8 g/dL (ref 1.5–4.5)
Glucose: 115 mg/dL — ABNORMAL HIGH (ref 65–99)
Potassium: 4.4 mmol/L (ref 3.5–5.2)
Sodium: 143 mmol/L (ref 134–144)
Total Protein: 6.3 g/dL (ref 6.0–8.5)

## 2019-12-15 LAB — VITAMIN D 25 HYDROXY (VIT D DEFICIENCY, FRACTURES): Vit D, 25-Hydroxy: 38.9 ng/mL (ref 30.0–100.0)

## 2019-12-15 LAB — LIPID PANEL
Chol/HDL Ratio: 2.8 ratio (ref 0.0–4.4)
Cholesterol, Total: 113 mg/dL (ref 100–199)
HDL: 40 mg/dL (ref >39)
LDL Chol Calc (NIH): 43 mg/dL (ref 0–99)
Triglycerides: 187 mg/dL — ABNORMAL HIGH (ref 0–149)
VLDL Cholesterol Cal: 30 mg/dL (ref 5–40)

## 2019-12-21 ENCOUNTER — Other Ambulatory Visit: Payer: Self-pay

## 2019-12-22 ENCOUNTER — Encounter: Payer: Self-pay | Admitting: Family Medicine

## 2019-12-22 ENCOUNTER — Ambulatory Visit (INDEPENDENT_AMBULATORY_CARE_PROVIDER_SITE_OTHER): Payer: Medicare HMO | Admitting: Family Medicine

## 2019-12-22 VITALS — BP 127/73 | HR 75 | Temp 99.6°F | Ht 61.0 in | Wt 197.0 lb

## 2019-12-22 DIAGNOSIS — M25561 Pain in right knee: Secondary | ICD-10-CM | POA: Diagnosis not present

## 2019-12-22 MED ORDER — BUPIVACAINE HCL 0.25 % IJ SOLN
1.5000 mL | Freq: Once | INTRAMUSCULAR | Status: AC
  Administered 2019-12-22: 1.5 mL

## 2019-12-22 MED ORDER — BETAMETHASONE SOD PHOS & ACET 6 (3-3) MG/ML IJ SUSP
12.0000 mg | Freq: Once | INTRAMUSCULAR | Status: AC
  Administered 2019-12-22: 12 mg via INTRAMUSCULAR

## 2019-12-22 NOTE — Progress Notes (Signed)
Chief Complaint  Patient presents with  . knee injection    Right    HPI  Patient presents today for continued knee pain. Worst on the right. She tried to stop mobic and found it was helping more than she realized. Pain is now at the medial aspect of the right knee joint. She would ike joint injection.   PMH: Smoking status noted ROS: Per HPI  Objective: BP 127/73   Pulse 75   Temp 99.6 F (37.6 C) (Temporal)   Ht 5\' 1"  (1.549 m)   Wt 197 lb (89.4 kg)   BMI 37.22 kg/m  Gen: NAD, alert, cooperative with exam HEENT: NCAT, EOMI, PERRL Ext: No edema, warm Neuro: Alert and oriented, No gross deficits  After obtaining informed consent, A steroid injection was performed at the right  knee. The medial compartment was cleansed with betadine. With 1.5 inch, 23 gauge needle using 2.5 ml marcan and 9 mg of Celestone the joint space was entered and injected. This was well tolerated. Simple dressing applied.  Assessment and plan:  1. Right knee pain, unspecified chronicity     Meds ordered this encounter  Medications  . betamethasone acetate-betamethasone sodium phosphate (CELESTONE) injection 12 mg  . bupivacaine (MARCAINE) 0.25 % (with pres) injection 1.5 mL      Follow up 3 mos if needed for next injection. Six months for routine care  Claretta Fraise, MD

## 2020-01-11 ENCOUNTER — Telehealth: Payer: Self-pay | Admitting: Family Medicine

## 2020-01-11 NOTE — Telephone Encounter (Signed)
Please contact the dental office with verbal approval from me. I will put it in writing if needed. Thanks, WS

## 2020-01-11 NOTE — Telephone Encounter (Signed)
Kaylie at the dental office aware

## 2020-01-11 NOTE — Telephone Encounter (Signed)
Almyra Free from [facility] called stating that pt is about to have a filling done and pt is requesting a Sedative. Needs approval from Dr Livia Snellen. Can call office at [phone]

## 2020-01-16 ENCOUNTER — Other Ambulatory Visit: Payer: Self-pay | Admitting: Family Medicine

## 2020-02-15 DIAGNOSIS — M810 Age-related osteoporosis without current pathological fracture: Secondary | ICD-10-CM | POA: Diagnosis not present

## 2020-02-15 DIAGNOSIS — Z79811 Long term (current) use of aromatase inhibitors: Secondary | ICD-10-CM | POA: Diagnosis not present

## 2020-02-15 DIAGNOSIS — Z780 Asymptomatic menopausal state: Secondary | ICD-10-CM | POA: Diagnosis not present

## 2020-02-16 ENCOUNTER — Other Ambulatory Visit: Payer: Self-pay | Admitting: Family Medicine

## 2020-03-17 DIAGNOSIS — G4700 Insomnia, unspecified: Secondary | ICD-10-CM | POA: Diagnosis not present

## 2020-03-17 DIAGNOSIS — Z008 Encounter for other general examination: Secondary | ICD-10-CM | POA: Diagnosis not present

## 2020-03-17 DIAGNOSIS — I10 Essential (primary) hypertension: Secondary | ICD-10-CM | POA: Diagnosis not present

## 2020-03-17 DIAGNOSIS — E785 Hyperlipidemia, unspecified: Secondary | ICD-10-CM | POA: Diagnosis not present

## 2020-03-17 DIAGNOSIS — K219 Gastro-esophageal reflux disease without esophagitis: Secondary | ICD-10-CM | POA: Diagnosis not present

## 2020-03-17 DIAGNOSIS — B009 Herpesviral infection, unspecified: Secondary | ICD-10-CM | POA: Diagnosis not present

## 2020-03-17 DIAGNOSIS — C50919 Malignant neoplasm of unspecified site of unspecified female breast: Secondary | ICD-10-CM | POA: Diagnosis not present

## 2020-03-17 DIAGNOSIS — M1990 Unspecified osteoarthritis, unspecified site: Secondary | ICD-10-CM | POA: Diagnosis not present

## 2020-03-17 DIAGNOSIS — J449 Chronic obstructive pulmonary disease, unspecified: Secondary | ICD-10-CM | POA: Diagnosis not present

## 2020-03-17 DIAGNOSIS — G8929 Other chronic pain: Secondary | ICD-10-CM | POA: Diagnosis not present

## 2020-03-19 ENCOUNTER — Other Ambulatory Visit: Payer: Self-pay | Admitting: Family Medicine

## 2020-03-20 ENCOUNTER — Ambulatory Visit (INDEPENDENT_AMBULATORY_CARE_PROVIDER_SITE_OTHER): Payer: Medicare HMO | Admitting: Family Medicine

## 2020-03-20 ENCOUNTER — Other Ambulatory Visit: Payer: Self-pay

## 2020-03-20 ENCOUNTER — Encounter: Payer: Self-pay | Admitting: Family Medicine

## 2020-03-20 VITALS — BP 136/79 | HR 76 | Temp 97.8°F | Ht 61.0 in | Wt 204.6 lb

## 2020-03-20 DIAGNOSIS — R69 Illness, unspecified: Secondary | ICD-10-CM | POA: Diagnosis not present

## 2020-03-20 DIAGNOSIS — I10 Essential (primary) hypertension: Secondary | ICD-10-CM | POA: Diagnosis not present

## 2020-03-20 DIAGNOSIS — Z136 Encounter for screening for cardiovascular disorders: Secondary | ICD-10-CM | POA: Diagnosis not present

## 2020-03-20 DIAGNOSIS — G8929 Other chronic pain: Secondary | ICD-10-CM | POA: Diagnosis not present

## 2020-03-20 DIAGNOSIS — J439 Emphysema, unspecified: Secondary | ICD-10-CM

## 2020-03-20 DIAGNOSIS — C50919 Malignant neoplasm of unspecified site of unspecified female breast: Secondary | ICD-10-CM

## 2020-03-20 DIAGNOSIS — M810 Age-related osteoporosis without current pathological fracture: Secondary | ICD-10-CM | POA: Diagnosis not present

## 2020-03-20 DIAGNOSIS — Z170 Estrogen receptor positive status [ER+]: Secondary | ICD-10-CM

## 2020-03-20 DIAGNOSIS — M8588 Other specified disorders of bone density and structure, other site: Secondary | ICD-10-CM

## 2020-03-20 DIAGNOSIS — F17200 Nicotine dependence, unspecified, uncomplicated: Secondary | ICD-10-CM | POA: Diagnosis not present

## 2020-03-20 DIAGNOSIS — M25561 Pain in right knee: Secondary | ICD-10-CM | POA: Diagnosis not present

## 2020-03-20 NOTE — Progress Notes (Addendum)
Subjective:  Patient ID: Renee Maldonado, female    DOB: 1946/09/05  Age: 74 y.o. MRN: [id number]  CC: Knee Injection (right)   HPI [name] presents for injection in the right knee.  She has arthritis that can be very painful.  Did quite well for over 2 months from the previous injection.  However the right knee pain has rebounded.  She would like to have it reinjected today.  Additionally she had seen a doctor who came to her home and did a health assessment.  Her ABI was normal.  She had no evidence for peripheral arterial disease.  The doctor did recommend that she have AAA screening.  Patient tells me that she is continuing under the care of [facility] oncology for breast cancer.  She has a mammogram scheduled for 2 weeks from now and to follow-up with her oncologist a few days later.  She also had a bone density scan.  She was given Reclast last fall by oncology and currently she has pain bone density test showing T score -1.9 at the spine and T score -2.8 at the hip.  She is going to hold off and get the opinion of the oncologist with regard to treatment of the osteoporosis.   Depression screen [facility] 2/9 03/20/2020 12/22/2019 12/14/2019  Decreased Interest 0 0 0  Down, Depressed, Hopeless 0 0 0  PHQ - 2 Score 0 0 0    History Zakyra has a past medical history of Anxiety, GERD (gastroesophageal reflux disease), Humerus fracture (2007), Hyperlipidemia, Hypertension, and Insomnia.   She has a past surgical history that includes Total knee arthroplasty; Left shoulder surgery; Joint replacement; Tubal ligation; Breast surgery (Right, 01/2019); and Breast lumpectomy (Right, 02/2019).   Her family history includes Alzheimer's disease in her mother; Cancer in her brother; Diabetes in her mother; Hypertension in her father; Stroke in her father.She reports that she has been smoking cigarettes. She has a 10.00 pack-year smoking history. She has never used smokeless tobacco. She  reports current alcohol use. She reports that she does not use drugs.    ROS Review of Systems  Constitutional: Negative.   HENT: Negative.   Eyes: Negative for visual disturbance.  Respiratory: Negative for shortness of breath.   Cardiovascular: Negative for chest pain.  Gastrointestinal: Negative for abdominal pain.  Musculoskeletal: Negative for arthralgias.    Objective:  BP 136/79   Pulse 76   Temp 97.8 F (36.6 C) (Temporal)   Ht 5\' 1"  (1.549 m)   Wt 204 lb 9.6 oz (92.8 kg)   BMI 38.66 kg/m   BP Readings from Last 3 Encounters:  03/20/20 136/79  12/22/19 127/73  12/14/19 126/69    Wt Readings from Last 3 Encounters:  03/20/20 204 lb 9.6 oz (92.8 kg)  12/22/19 197 lb (89.4 kg)  12/14/19 200 lb (90.7 kg)     Physical Exam Constitutional:      General: She is not in acute distress.    Appearance: She is well-developed.  Cardiovascular:     Rate and Rhythm: Normal rate and regular rhythm.  Pulmonary:     Breath sounds: Normal breath sounds.  Skin:    General: Skin is warm and dry.  Neurological:     Mental Status: She is alert and oriented to person, place, and time.    After obtaining informed consent, A steroid injection was performed at the right knee. The lateral compartment was cleansed with betadine. With 1.5 inch, 23 gauge needle  using 2.5 ml marcan and 9 mg of Celestone the joint space was entered and injected. This was well tolerated. Simple dressing applied.    Assessment & Plan:   [name] was seen today for knee injection.  Diagnoses and all orders for this visit:  Chronic pain of right knee  Current every day smoker -     [place] [employer]ARE SCREENING; Future  Morbid obesity ([place])  Pulmonary emphysema, unspecified emphysema type ([place])  Essential hypertension  Malignant neoplasm of breast in female, estrogen receptor positive, unspecified laterality, unspecified site of breast ([place])  Osteopenia of lumbar spine  Age-related  osteoporosis without current pathological fracture       I am having [place] maintain her cholecalciferol, Calcium-Vitamin D-Vitamin K (CALCIUM + D + K PO), Restasis, Aspirin-Caffeine, ondansetron, albuterol, Lysine, fluticasone, acetaminophen, anastrozole, Zoledronic Acid (RECLAST IV), clindamycin, mirabegron ER, amLODipine, Breo Ellipta, hydrochlorothiazide, melatonin, rosuvastatin, traZODone, valACYclovir, hydrochlorothiazide, meloxicam, and famotidine.  Allergies as of 03/20/2020      Reactions   Benadryl [diphenhydramine Hcl]    Penicillins    Had a rash around 20 years ago but unsure if it come from medicine.       Medication List       Accurate as of Mar 20, 2020 11:59 PM. If you have any questions, ask your nurse or doctor.        acetaminophen 500 MG tablet Commonly known as: TYLENOL Take by mouth.   albuterol 108 (90 Base) MCG/ACT inhaler Commonly known as: VENTOLIN HFA Inhale 2 puffs into the lungs every 6 (six) hours as needed for wheezing or shortness of breath.   amLODipine 5 MG tablet Commonly known as: NORVASC Take 1 tablet (5 mg total) by mouth daily.   anastrozole 1 MG tablet Commonly known as: ARIMIDEX   Bayer Back & Body Pain Ex St 500-32.5 MG Tabs Generic drug: Aspirin-Caffeine Take 1 tablet by mouth daily as needed.   Breo Ellipta 100-25 MCG/INH Aepb Generic drug: fluticasone furoate-vilanterol INHALE 1 PUFF ONCE DAILY   CALCIUM + D + K PO Take 1 tablet by mouth 2 (two) times daily before a meal.   cholecalciferol 1000 units tablet Commonly known as: VITAMIN D Take 1,000 Units by mouth daily.   clindamycin 300 MG capsule Commonly known as: CLEOCIN Take 300 mg by mouth 2 (two) times daily. Before Dental procedure   famotidine 40 MG tablet Commonly known as: PEPCID TAKE 1 TABLET DAILY   fluticasone 50 MCG/ACT nasal spray Commonly known as: FLONASE Place 2 sprays into both nostrils daily.   hydrochlorothiazide 12.5 MG  tablet Commonly known as: HYDRODIURIL Take 2 tablets (25 mg total) by mouth daily.   hydrochlorothiazide 25 MG tablet Commonly known as: HYDRODIURIL TAKE 1 TABLET DAILY   Lysine 500 MG Tabs Take 1 tablet (500 mg total) by mouth 2 (two) times daily before a meal.   melatonin 5 MG Tabs Take 2 tablets (10 mg total) by mouth daily.   meloxicam 15 MG tablet Commonly known as: MOBIC TAKE 1 TABLET ONCE DAILY FOR JOINT AND MUSCLE PAIN   mirabegron ER 25 MG Tb24 tablet Commonly known as: Myrbetriq Take 1 tablet (25 mg total) by mouth daily.   ondansetron 8 MG disintegrating tablet Commonly known as: ZOFRAN-ODT Take 1 tablet (8 mg total) by mouth every 6 (six) hours as needed for nausea or vomiting.   RECLAST IV Inject into the vein.   Restasis 0.05 % ophthalmic emulsion Generic drug: cycloSPORINE Place 1 drop into  both eyes 2 (two) times daily as needed.   rosuvastatin 20 MG tablet Commonly known as: CRESTOR Take 1 tablet (20 mg total) by mouth daily.   traZODone 50 MG tablet Commonly known as: DESYREL Take 1 tablet (50 mg total) by mouth at bedtime.   valACYclovir 500 MG tablet Commonly known as: VALTREX TAKE (1) TABLET DAILY AS NEEDED.        Follow-up: Return in about 3 months (around 06/20/2020).  Claretta Fraise, M.D.

## 2020-03-27 ENCOUNTER — Other Ambulatory Visit: Payer: Self-pay | Admitting: Family Medicine

## 2020-03-27 DIAGNOSIS — I700 Atherosclerosis of aorta: Secondary | ICD-10-CM

## 2020-03-27 DIAGNOSIS — K219 Gastro-esophageal reflux disease without esophagitis: Secondary | ICD-10-CM

## 2020-03-27 DIAGNOSIS — F17200 Nicotine dependence, unspecified, uncomplicated: Secondary | ICD-10-CM

## 2020-03-29 ENCOUNTER — Other Ambulatory Visit: Payer: Self-pay | Admitting: Family Medicine

## 2020-03-29 ENCOUNTER — Telehealth: Payer: Self-pay | Admitting: Family Medicine

## 2020-03-29 DIAGNOSIS — I700 Atherosclerosis of aorta: Secondary | ICD-10-CM

## 2020-04-04 DIAGNOSIS — Z170 Estrogen receptor positive status [ER+]: Secondary | ICD-10-CM | POA: Diagnosis not present

## 2020-04-04 DIAGNOSIS — Z9289 Personal history of other medical treatment: Secondary | ICD-10-CM | POA: Diagnosis not present

## 2020-04-04 DIAGNOSIS — C50311 Malignant neoplasm of lower-inner quadrant of right female breast: Secondary | ICD-10-CM | POA: Diagnosis not present

## 2020-04-04 DIAGNOSIS — R928 Other abnormal and inconclusive findings on diagnostic imaging of breast: Secondary | ICD-10-CM | POA: Diagnosis not present

## 2020-04-09 ENCOUNTER — Other Ambulatory Visit: Payer: Self-pay

## 2020-04-09 ENCOUNTER — Ambulatory Visit (HOSPITAL_COMMUNITY)
Admission: RE | Admit: 2020-04-09 | Discharge: 2020-04-09 | Disposition: A | Discharge status: Home / Self Care | Payer: Medicare HMO | Source: Ambulatory Visit | Attending: Family Medicine | Admitting: Family Medicine

## 2020-04-09 DIAGNOSIS — R69 Illness, unspecified: Secondary | ICD-10-CM | POA: Diagnosis not present

## 2020-04-09 DIAGNOSIS — Z136 Encounter for screening for cardiovascular disorders: Secondary | ICD-10-CM | POA: Insufficient documentation

## 2020-04-09 DIAGNOSIS — I700 Atherosclerosis of aorta: Secondary | ICD-10-CM

## 2020-04-09 DIAGNOSIS — F17210 Nicotine dependence, cigarettes, uncomplicated: Secondary | ICD-10-CM | POA: Diagnosis not present

## 2020-04-09 DIAGNOSIS — F17200 Nicotine dependence, unspecified, uncomplicated: Secondary | ICD-10-CM

## 2020-04-10 DIAGNOSIS — Z6835 Body mass index (BMI) 35.0-35.9, adult: Secondary | ICD-10-CM | POA: Diagnosis not present

## 2020-04-10 DIAGNOSIS — Z79899 Other long term (current) drug therapy: Secondary | ICD-10-CM | POA: Diagnosis not present

## 2020-04-10 DIAGNOSIS — M818 Other osteoporosis without current pathological fracture: Secondary | ICD-10-CM | POA: Diagnosis not present

## 2020-04-10 DIAGNOSIS — T451X5A Adverse effect of antineoplastic and immunosuppressive drugs, initial encounter: Secondary | ICD-10-CM | POA: Diagnosis not present

## 2020-04-10 DIAGNOSIS — Z170 Estrogen receptor positive status [ER+]: Secondary | ICD-10-CM | POA: Diagnosis not present

## 2020-04-10 DIAGNOSIS — Z79811 Long term (current) use of aromatase inhibitors: Secondary | ICD-10-CM | POA: Diagnosis not present

## 2020-04-10 DIAGNOSIS — C50311 Malignant neoplasm of lower-inner quadrant of right female breast: Secondary | ICD-10-CM | POA: Diagnosis not present

## 2020-04-10 DIAGNOSIS — Z5181 Encounter for therapeutic drug level monitoring: Secondary | ICD-10-CM | POA: Diagnosis not present

## 2020-04-13 ENCOUNTER — Other Ambulatory Visit: Payer: Self-pay | Admitting: *Deleted

## 2020-04-13 MED ORDER — ALBUTEROL SULFATE HFA 108 (90 BASE) MCG/ACT IN AERS: 2.0000 | INHALATION_SPRAY | Freq: Four times a day (QID) | RESPIRATORY_TRACT | 0 refills | Status: DC | PRN

## 2020-04-19 ENCOUNTER — Other Ambulatory Visit: Payer: Self-pay | Admitting: Family Medicine

## 2020-05-22 ENCOUNTER — Other Ambulatory Visit: Payer: Self-pay | Admitting: Family Medicine

## 2020-05-30 ENCOUNTER — Other Ambulatory Visit: Payer: Self-pay | Admitting: Family Medicine

## 2020-05-30 ENCOUNTER — Telehealth: Payer: Self-pay | Admitting: Family Medicine

## 2020-05-30 DIAGNOSIS — G8929 Other chronic pain: Secondary | ICD-10-CM

## 2020-05-30 NOTE — Telephone Encounter (Signed)
  REFERRAL REQUEST Telephone Note 05/30/2020  What type of referral do you need? Orthopedic  Why do you need this referral? Knee Pain  Have you been seen at our office for this problem? Yes - has had shots in knee from Dr. Livia Snellen  (Advise that they may need an appointment with their PCP before a referral can be done)  Is there a particular doctor or location that you prefer? [facility]  Patient notified that referrals can take up to a week or longer to process. If they haven't heard anything within a week they should call back and speak with the referral department.

## 2020-06-12 ENCOUNTER — Ambulatory Visit (INDEPENDENT_AMBULATORY_CARE_PROVIDER_SITE_OTHER): Payer: Medicare HMO | Admitting: Family Medicine

## 2020-06-12 ENCOUNTER — Other Ambulatory Visit: Payer: Self-pay

## 2020-06-12 ENCOUNTER — Encounter: Payer: Self-pay | Admitting: Family Medicine

## 2020-06-12 VITALS — BP 115/70 | HR 71 | Temp 98.0°F | Ht 61.0 in | Wt 199.4 lb

## 2020-06-12 DIAGNOSIS — R0609 Other forms of dyspnea: Secondary | ICD-10-CM

## 2020-06-12 DIAGNOSIS — F411 Generalized anxiety disorder: Secondary | ICD-10-CM

## 2020-06-12 DIAGNOSIS — E782 Mixed hyperlipidemia: Secondary | ICD-10-CM

## 2020-06-12 DIAGNOSIS — R0600 Dyspnea, unspecified: Secondary | ICD-10-CM | POA: Diagnosis not present

## 2020-06-12 DIAGNOSIS — I10 Essential (primary) hypertension: Secondary | ICD-10-CM

## 2020-06-12 DIAGNOSIS — J439 Emphysema, unspecified: Secondary | ICD-10-CM | POA: Diagnosis not present

## 2020-06-12 DIAGNOSIS — K219 Gastro-esophageal reflux disease without esophagitis: Secondary | ICD-10-CM

## 2020-06-12 DIAGNOSIS — F5101 Primary insomnia: Secondary | ICD-10-CM

## 2020-06-12 DIAGNOSIS — R69 Illness, unspecified: Secondary | ICD-10-CM | POA: Diagnosis not present

## 2020-06-12 MED ORDER — MIRABEGRON ER 50 MG PO TB24: 50.0000 mg | ORAL_TABLET | Freq: Every day | ORAL | 5 refills | Status: DC

## 2020-06-12 NOTE — Progress Notes (Signed)
Subjective:  Patient ID: Renee Maldonado, female    DOB: 1946/06/21  Age: 74 y.o. MRN: [id number]  CC: Follow-up (6 month)   HPI Renee Maldonado presents for  follow-up of hypertension. Patient has no history of headache chest pain or shortness of breath or recent cough. Patient also denies symptoms of TIA such as focal numbness or weakness. Patient denies side effects from medication. States taking it regularly.  Renee Maldonado reports swelling at the end of the day daily.  There is kind of a bulging at the top of her socks and imprint of the sock.  Renee Maldonado also has some dyspnea on exertion periodically.  Renee Maldonado has a diagnosis of COPD and Renee Maldonado is not really using her inhaler.  Patient did not know that amlodipine can cause swelling as a side effect.  Patient tells me that Renee Maldonado is ready to get her right knee replaced.  Last injection did not help.  Renee Maldonado says Renee Maldonado cannot walk or exercise due to her knee pain.  Meloxicam is not helping anymore.  Renee Maldonado says Renee Maldonado has not heard back from the orthopedic referral that was placed few weeks ago.  Renee Maldonado has a wedding to go to in about 3 months and Renee Maldonado wants to have the surgery soon enough to be rehabbed enough to go to [place] for the wedding, or if Renee Maldonado wants to wait until after that.  Either way Renee Maldonado would like to see the orthopedist as soon as possible for decision-making.  Renee Maldonado prefers Dr. Wynelle Link  if he is available.  Patient also concerned that the Myrbetriq did not help so Renee Maldonado discontinued it.  Renee Maldonado is still getting up 3-4 times a night to go to the bathroom.   History Renee Maldonado has a past medical history of Anxiety, GERD (gastroesophageal reflux disease), Humerus fracture (2007), Hyperlipidemia, Hypertension, and Insomnia.   Renee Maldonado has a past surgical history that includes Total knee arthroplasty; Left shoulder surgery; Joint replacement; Tubal ligation; Breast surgery (Right, 01/2019); and Breast lumpectomy (Right, 02/2019).   Her family history includes Alzheimer's disease in her  mother; Cancer in her brother; Diabetes in her mother; Hypertension in her father; Stroke in her father.Renee Maldonado reports that Renee Maldonado has been smoking cigarettes. Renee Maldonado has a 10.00 pack-year smoking history. Renee Maldonado has never used smokeless tobacco. Renee Maldonado reports current alcohol use. Renee Maldonado reports that Renee Maldonado does not use drugs.  Current Outpatient Medications on File Prior to Visit  Medication Sig Dispense Refill  . acetaminophen (TYLENOL) 500 MG tablet Take by mouth.    Marland Kitchen albuterol (VENTOLIN HFA) 108 (90 Base) MCG/ACT inhaler Inhale 2 puffs into the lungs every 6 (six) hours as needed for wheezing or shortness of breath. 8.5 g 0  . amLODipine (NORVASC) 5 MG tablet Take 1 tablet (5 mg total) by mouth daily. 90 tablet 0  . anastrozole (ARIMIDEX) 1 MG tablet     . Aspirin-Caffeine (BAYER BACK & BODY PAIN EX ST) 500-32.5 MG TABS Take 1 tablet by mouth daily as needed.    . Calcium-Vitamin D-Vitamin K (CALCIUM + D + K PO) Take 1 tablet by mouth 2 (two) times daily before a meal.     . cholecalciferol (VITAMIN D) 1000 UNITS tablet Take 1,000 Units by mouth daily.    . clindamycin (CLEOCIN) 300 MG capsule Take 300 mg by mouth 2 (two) times daily. Before Dental procedure    . famotidine (PEPCID) 40 MG tablet TAKE 1 TABLET DAILY 90 tablet 0  . fluticasone (FLONASE) 50 MCG/ACT nasal spray Place 2  sprays into both nostrils daily. 16 g 1  . fluticasone furoate-vilanterol (BREO ELLIPTA) 100-25 MCG/INH AEPB INHALE 1 PUFF ONCE DAILY 90 each 3  . hydrochlorothiazide (HYDRODIURIL) 25 MG tablet TAKE 1 TABLET DAILY 90 tablet 0  . Lysine 500 MG TABS Take 1 tablet (500 mg total) by mouth 2 (two) times daily before a meal. 180 tablet 1  . Melatonin 5 MG TABS Take 2 tablets (10 mg total) by mouth daily. 180 tablet 3  . meloxicam (MOBIC) 15 MG tablet TAKE 1 TABLET ONCE DAILY FOR JOINT AND MUSCLE PAIN 30 tablet 0  . rosuvastatin (CRESTOR) 20 MG tablet Take 1 tablet (20 mg total) by mouth daily. 90 tablet 1  . traZODone (DESYREL) 50 MG  tablet Take 1 tablet (50 mg total) by mouth at bedtime. 90 tablet 1  . valACYclovir (VALTREX) 500 MG tablet TAKE (1) TABLET DAILY AS NEEDED. 30 tablet 2  . Zoledronic Acid (RECLAST IV) Inject into the vein.    . hydrochlorothiazide (HYDRODIURIL) 12.5 MG tablet Take 2 tablets (25 mg total) by mouth daily. (Patient not taking: Reported on 06/12/2020) 90 tablet 1  . ondansetron (ZOFRAN-ODT) 8 MG disintegrating tablet Take 1 tablet (8 mg total) by mouth every 6 (six) hours as needed for nausea or vomiting. (Patient not taking: Reported on 06/12/2020) 20 tablet 1  . RESTASIS 0.05 % ophthalmic emulsion Place 1 drop into both eyes 2 (two) times daily as needed.  (Patient not taking: Reported on 06/12/2020)     No current facility-administered medications on file prior to visit.    ROS Review of Systems  Constitutional: Negative.   HENT: Negative for congestion.   Eyes: Negative for visual disturbance.  Respiratory: Negative for shortness of breath.   Cardiovascular: Negative for chest pain.  Gastrointestinal: Negative for abdominal pain, constipation, diarrhea, nausea and vomiting.  Genitourinary: Positive for frequency (Including getting up at night 2-4 times to urinate). Negative for difficulty urinating.  Musculoskeletal: Positive for arthralgias (Worst at the right knee.). Negative for myalgias.  Neurological: Negative for headaches.  Psychiatric/Behavioral: Negative for sleep disturbance.    Objective:  BP 115/70   Pulse 71   Temp 98 F (36.7 C) (Temporal)   Ht '5\' 1"'  (1.549 m)   Wt 199 lb 6.4 oz (90.4 kg)   BMI 37.68 kg/m   BP Readings from Last 3 Encounters:  06/12/20 115/70  03/20/20 136/79  12/22/19 127/73    Wt Readings from Last 3 Encounters:  06/12/20 199 lb 6.4 oz (90.4 kg)  03/20/20 204 lb 9.6 oz (92.8 kg)  12/22/19 197 lb (89.4 kg)     Physical Exam Constitutional:      General: Renee Maldonado is not in acute distress.    Appearance: Renee Maldonado is well-developed.  HENT:      Head: Normocephalic and atraumatic.  Eyes:     Conjunctiva/sclera: Conjunctivae normal.     Pupils: Pupils are equal, round, and reactive to light.  Neck:     Thyroid: No thyromegaly.  Cardiovascular:     Rate and Rhythm: Normal rate and regular rhythm.     Heart sounds: Normal heart sounds. No murmur heard.   Pulmonary:     Effort: Pulmonary effort is normal. No respiratory distress.     Breath sounds: Normal breath sounds. No wheezing or rales.  Abdominal:     General: Bowel sounds are normal. There is no distension.     Palpations: Abdomen is soft.     Tenderness: There is no abdominal  tenderness.  Musculoskeletal:        General: Normal range of motion.     Cervical back: Normal range of motion and neck supple.  Lymphadenopathy:     Cervical: No cervical adenopathy.  Skin:    General: Skin is warm and dry.  Neurological:     Mental Status: Renee Maldonado is alert and oriented to person, place, and time.  Psychiatric:        Behavior: Behavior normal.        Thought Content: Thought content normal.        Judgment: Judgment normal.       Assessment & Plan:   Renee Maldonado was seen today for follow-up.  Diagnoses and all orders for this visit:  Mixed hyperlipidemia -     CBC with Differential/Platelet -     CMP14+EGFR -     Lipid panel  GAD (generalized anxiety disorder) -     CBC with Differential/Platelet -     CMP14+EGFR  Essential hypertension -     CBC with Differential/Platelet -     CMP14+EGFR  Gastroesophageal reflux disease without esophagitis -     CBC with Differential/Platelet -     CMP14+EGFR  Primary insomnia -     CBC with Differential/Platelet -     CMP14+EGFR  Pulmonary emphysema, unspecified emphysema type (HCC) -     CBC with Differential/Platelet -     CMP14+EGFR  Dyspnea on exertion -     CBC with Differential/Platelet -     CMP14+EGFR -     Brain natriuretic peptide  Other orders -     mirabegron ER (MYRBETRIQ) 50 MG TB24 tablet; Take 1  tablet (50 mg total) by mouth daily.   Allergies as of 06/12/2020      Reactions   Benadryl [diphenhydramine Hcl]    Penicillins    Had a rash around 20 years ago but unsure if it come from medicine.       Medication List       Accurate as of June 12, 2020  7:46 PM. If you have any questions, ask your nurse or doctor.        acetaminophen 500 MG tablet Commonly known as: TYLENOL Take by mouth.   albuterol 108 (90 Base) MCG/ACT inhaler Commonly known as: VENTOLIN HFA Inhale 2 puffs into the lungs every 6 (six) hours as needed for wheezing or shortness of breath.   amLODipine 5 MG tablet Commonly known as: NORVASC Take 1 tablet (5 mg total) by mouth daily.   anastrozole 1 MG tablet Commonly known as: ARIMIDEX   Bayer Back & Body Pain Ex St 500-32.5 MG Tabs Generic drug: Aspirin-Caffeine Take 1 tablet by mouth daily as needed.   Breo Ellipta 100-25 MCG/INH Aepb Generic drug: fluticasone furoate-vilanterol INHALE 1 PUFF ONCE DAILY   CALCIUM + D + K PO Take 1 tablet by mouth 2 (two) times daily before a meal.   cholecalciferol 1000 units tablet Commonly known as: VITAMIN D Take 1,000 Units by mouth daily.   clindamycin 300 MG capsule Commonly known as: CLEOCIN Take 300 mg by mouth 2 (two) times daily. Before Dental procedure   famotidine 40 MG tablet Commonly known as: PEPCID TAKE 1 TABLET DAILY   fluticasone 50 MCG/ACT nasal spray Commonly known as: FLONASE Place 2 sprays into both nostrils daily.   hydrochlorothiazide 12.5 MG tablet Commonly known as: HYDRODIURIL Take 2 tablets (25 mg total) by mouth daily.   hydrochlorothiazide 25 MG tablet Commonly  known as: HYDRODIURIL TAKE 1 TABLET DAILY   Lysine 500 MG Tabs Take 1 tablet (500 mg total) by mouth 2 (two) times daily before a meal.   melatonin 5 MG Tabs Take 2 tablets (10 mg total) by mouth daily.   meloxicam 15 MG tablet Commonly known as: MOBIC TAKE 1 TABLET ONCE DAILY FOR JOINT AND MUSCLE  PAIN   mirabegron ER 50 MG Tb24 tablet Commonly known as: Myrbetriq Take 1 tablet (50 mg total) by mouth daily. What changed:   medication strength  how much to take Changed by: Claretta Fraise, MD   ondansetron 8 MG disintegrating tablet Commonly known as: ZOFRAN-ODT Take 1 tablet (8 mg total) by mouth every 6 (six) hours as needed for nausea or vomiting.   RECLAST IV Inject into the vein.   Restasis 0.05 % ophthalmic emulsion Generic drug: cycloSPORINE Place 1 drop into both eyes 2 (two) times daily as needed.   rosuvastatin 20 MG tablet Commonly known as: CRESTOR Take 1 tablet (20 mg total) by mouth daily.   traZODone 50 MG tablet Commonly known as: DESYREL Take 1 tablet (50 mg total) by mouth at bedtime.   valACYclovir 500 MG tablet Commonly known as: VALTREX TAKE (1) TABLET DAILY AS NEEDED.       Meds ordered this encounter  Medications  . mirabegron ER (MYRBETRIQ) 50 MG TB24 tablet    Sig: Take 1 tablet (50 mg total) by mouth daily.    Dispense:  30 tablet    Refill:  5      Follow-up: Return in about 3 months (around 09/12/2020).  Claretta Fraise, M.D.

## 2020-06-12 NOTE — Patient Instructions (Signed)
Contact [facility] for an appointment to discuss Knee Replacement [phone]

## 2020-06-13 ENCOUNTER — Other Ambulatory Visit: Payer: Self-pay | Admitting: Family Medicine

## 2020-06-13 LAB — CBC WITH DIFFERENTIAL/PLATELET
Basophils Absolute: 0 10*3/uL (ref 0.0–0.2)
Basos: 1 %
EOS (ABSOLUTE): 0.2 10*3/uL (ref 0.0–0.4)
Eos: 3 %
Hematocrit: 40.5 % (ref 34.0–46.6)
Hemoglobin: 13 g/dL (ref 11.1–15.9)
Immature Grans (Abs): 0 10*3/uL (ref 0.0–0.1)
Immature Granulocytes: 0 %
Lymphocytes Absolute: 1.1 10*3/uL (ref 0.7–3.1)
Lymphs: 22 %
MCH: 27.1 pg (ref 26.6–33.0)
MCHC: 32.1 g/dL (ref 31.5–35.7)
MCV: 84 fL (ref 79–97)
Monocytes Absolute: 0.5 10*3/uL (ref 0.1–0.9)
Monocytes: 9 %
Neutrophils Absolute: 3.4 10*3/uL (ref 1.4–7.0)
Neutrophils: 65 %
Platelets: 280 10*3/uL (ref 150–450)
RBC: 4.8 x10E6/uL (ref 3.77–5.28)
RDW: 13.3 % (ref 11.7–15.4)
WBC: 5.2 10*3/uL (ref 3.4–10.8)

## 2020-06-13 LAB — CMP14+EGFR
ALT: 19 IU/L (ref 0–32)
AST: 18 IU/L (ref 0–40)
Albumin/Globulin Ratio: 2.3 — ABNORMAL HIGH (ref 1.2–2.2)
Albumin: 4.5 g/dL (ref 3.7–4.7)
Alkaline Phosphatase: 103 IU/L (ref 48–121)
BUN/Creatinine Ratio: 19 (ref 12–28)
BUN: 23 mg/dL (ref 8–27)
Bilirubin Total: 0.4 mg/dL (ref 0.0–1.2)
CO2: 27 mmol/L (ref 20–29)
Calcium: 9.6 mg/dL (ref 8.7–10.3)
Chloride: 99 mmol/L (ref 96–106)
Creatinine, Ser: 1.2 mg/dL — ABNORMAL HIGH (ref 0.57–1.00)
GFR calc Af Amer: 51 mL/min/{1.73_m2} — ABNORMAL LOW (ref >59)
GFR calc non Af Amer: 45 mL/min/{1.73_m2} — ABNORMAL LOW (ref >59)
Globulin, Total: 2 g/dL (ref 1.5–4.5)
Glucose: 113 mg/dL — ABNORMAL HIGH (ref 65–99)
Potassium: 3.9 mmol/L (ref 3.5–5.2)
Sodium: 141 mmol/L (ref 134–144)
Total Protein: 6.5 g/dL (ref 6.0–8.5)

## 2020-06-13 LAB — LIPID PANEL
Chol/HDL Ratio: 3.2 ratio (ref 0.0–4.4)
Cholesterol, Total: 121 mg/dL (ref 100–199)
HDL: 38 mg/dL — ABNORMAL LOW (ref >39)
LDL Chol Calc (NIH): 56 mg/dL (ref 0–99)
Triglycerides: 160 mg/dL — ABNORMAL HIGH (ref 0–149)
VLDL Cholesterol Cal: 27 mg/dL (ref 5–40)

## 2020-06-13 LAB — BRAIN NATRIURETIC PEPTIDE: BNP: 48.3 pg/mL (ref 0.0–100.0)

## 2020-06-13 NOTE — Progress Notes (Signed)
Hello [place],  Your lab result is normal and/or stable.Some minor variations that are not significant are commonly marked abnormal, but do not represent any medical problem for you.  Best regards, Claretta Fraise, M.D.

## 2020-06-18 ENCOUNTER — Other Ambulatory Visit: Payer: Self-pay | Admitting: Family Medicine

## 2020-07-12 DIAGNOSIS — C50311 Malignant neoplasm of lower-inner quadrant of right female breast: Secondary | ICD-10-CM | POA: Diagnosis not present

## 2020-07-13 DIAGNOSIS — C50311 Malignant neoplasm of lower-inner quadrant of right female breast: Secondary | ICD-10-CM | POA: Diagnosis not present

## 2020-07-16 ENCOUNTER — Other Ambulatory Visit: Payer: Self-pay | Admitting: Family Medicine

## 2020-07-16 DIAGNOSIS — G4709 Other insomnia: Secondary | ICD-10-CM

## 2020-07-25 DIAGNOSIS — M25561 Pain in right knee: Secondary | ICD-10-CM | POA: Insufficient documentation

## 2020-07-26 DIAGNOSIS — M1711 Unilateral primary osteoarthritis, right knee: Secondary | ICD-10-CM | POA: Diagnosis not present

## 2020-07-26 DIAGNOSIS — M25561 Pain in right knee: Secondary | ICD-10-CM | POA: Diagnosis not present

## 2020-08-01 ENCOUNTER — Ambulatory Visit (INDEPENDENT_AMBULATORY_CARE_PROVIDER_SITE_OTHER): Payer: Medicare HMO | Admitting: *Deleted

## 2020-08-01 VITALS — Ht 61.0 in | Wt 199.3 lb

## 2020-08-01 DIAGNOSIS — Z0000 Encounter for general adult medical examination without abnormal findings: Secondary | ICD-10-CM | POA: Diagnosis not present

## 2020-08-01 NOTE — Patient Instructions (Signed)
[place] Maintenance Summary and Written Plan of Care  Renee Maldonado ,  Thank you for allowing me to perform your Medicare Annual Wellness Visit and for your ongoing commitment to your health.   Health Maintenance & Immunization History Health Maintenance  Topic Date Due  . COLON CANCER SCREENING ANNUAL FOBT  04/29/2017  . MAMMOGRAM  02/09/2020  . DEXA SCAN  02/09/2020  . INFLUENZA VACCINE  06/17/2020  . COLONOSCOPY  01/09/2021  . TETANUS/TDAP  06/30/2023  . COVID-19 Vaccine  Completed  . Hepatitis C Screening  Completed  . PNA vac Low Risk Adult  Completed   Immunization History  Administered Date(s) Administered  . Fluad Quad(high Dose 65+) 07/20/2019  . H1N1 10/24/2008  . Influenza Nasal 07/19/2011  . Influenza Split 07/28/2013  . Influenza,inj,Quad PF,6+ Mos 08/30/2018  . Influenza,inj,quad, With Preservative 08/24/2017  . Influenza-Unspecified 08/23/2014, 08/07/2015, 08/15/2016, 08/30/2018, 07/20/2019  . Moderna SARS-COVID-2 Vaccination Jun 02, 1946, 01/24/2020  . Pneumococcal Conjugate-13 08/23/2014  . Pneumococcal Polysaccharide-23 08/30/2015  . Tdap 06/29/2013    These are the patient goals that we discussed: Goals Addressed            This Visit's Progress   . Exercise 3x per week (30 min per time)       08/01/2020 AWV Goal: Exercise for General Health   Patient will verbalize understanding of the benefits of increased physical activity:  Exercising regularly is important. It will improve your overall fitness, flexibility, and endurance.  Regular exercise also will improve your overall health. It can help you control your weight, reduce stress, and improve your bone density.  Over the next year, patient will increase physical activity as tolerated with a goal of at least 150 minutes of moderate physical activity per week.   You can tell that you are exercising at a moderate intensity if your heart starts beating faster and you  start breathing faster but can still hold a conversation.  Moderate-intensity exercise ideas include:  Walking 1 mile (1.6 km) in about 15 minutes  Biking  Hiking  Golfing  Dancing  Water aerobics  Patient will verbalize understanding of everyday activities that increase physical activity by providing examples like the following: ? Yard work, such as: ? Pushing a Conservation officer, nature ? Raking and bagging leaves ? Washing your car ? Pushing a stroller ? Shoveling snow ? Gardening ? Washing windows or floors  Patient will be able to explain general safety guidelines for exercising:   Before you start a new exercise program, talk with your health care provider.  Do not exercise so much that you hurt yourself, feel dizzy, or get very short of breath.  Wear comfortable clothes and wear shoes with good support.  Drink plenty of water while you exercise to prevent dehydration or heat stroke.  Work out until your breathing and your heartbeat get faster.         This is a list of Health Maintenance Items that are overdue or due now: Health Maintenance Due  Topic Date Due  . COLON CANCER SCREENING ANNUAL FOBT  04/29/2017  . MAMMOGRAM  02/09/2020  . DEXA SCAN  02/09/2020  . INFLUENZA VACCINE  06/17/2020     Orders/Referrals Placed Today: No orders of the defined types were placed in this encounter.  (Contact our referral department at [phone] if you have not spoken with someone about your referral appointment within the next 5 days)    Follow-up Plan Follow up with Dr. Livia Snellen as  planned  Schedule Mammogram, Eye Exam and Dexa

## 2020-08-01 NOTE — Progress Notes (Addendum)
MEDICARE ANNUAL WELLNESS VISIT  08/01/2020  Telephone Visit Disclaimer This Medicare AWV was conducted by telephone due to national recommendations for restrictions regarding the COVID-19 Pandemic (e.g. social distancing).  I verified, using two identifiers, that I am speaking with Renee Maldonado or their authorized healthcare agent. I discussed the limitations, risks, security, and privacy concerns of performing an evaluation and management service by telephone and the potential availability of an in-person appointment in the future. The patient expressed understanding and agreed to proceed.  Location of Patient:  HOME Location of Provider (nurse):  OFFICE   Subjective:    Renee Maldonado is a 74 y.o. female patient of Stacks, Cletus Gash, MD who had a Medicare Annual Wellness Visit today via telephone. Renee Maldonado is Retired and lives with their spouse. she has 3 children. she reports that she is socially active and does interact with friends/family regularly. she is not physically active and enjoys reading.  Patient Care Team: Claretta Fraise, MD as PCP - General (Family Medicine) Al Corpus Marilynne Drivers, MD as Referring Physician (Orthopedic Surgery) Shea Evans Norva Riffle, LCSW as Social Worker (Licensed Clinical Social Worker)  Advanced Directives 08/01/2020 07/28/2019 03/20/2015 12/04/2014  Does Patient Have a Medical Advance Directive? Yes No No No  Type of Paramedic of  [place];Living will - - -  Does patient want to make changes to medical advance directive? No - Patient declined - - -  Copy of [place] in Chart? No - copy requested - - -  Would patient like information on creating a medical advance directive? - No - Patient declined No - patient declined information Yes - Educational materials given    Hospital Utilization Over the Past 12 Months: # of hospitalizations or ER visits: 0 # of surgeries: 0  Review of Systems    Patient reports that her overall  health is worse compared to last year.  History obtained from chart review and the patient General ROS: negative Musculoskeletal ROS: positive for - pain in knee - right  Patient Reported Readings (BP, Pulse, CBG, Weight, etc) none  Pain Assessment Pain : 0-10 Pain Score: 7  Pain Type: Chronic pain Pain Location: Knee Pain Orientation: Right Pain Descriptors / Indicators: Aching Pain Onset: More than a month ago Pain Frequency: Constant Effect of Pain on Daily Activities: Trouble exercising     Current Medications & Allergies (verified) Allergies as of 08/01/2020      Reactions   Benadryl [diphenhydramine Hcl]    Penicillins    Had a rash around 20 years ago but unsure if it come from medicine.       Medication List       Accurate as of August 01, 2020 10:02 AM. If you have any questions, ask your nurse or doctor.        STOP taking these medications   Caltrate 600 1500 (600 Ca) MG Tabs tablet Generic drug: calcium carbonate   cholecalciferol 1000 units tablet Commonly known as: VITAMIN D   Vitamin D3 1.25 MG (50000 UT) Caps     TAKE these medications   acetaminophen 500 MG tablet Commonly known as: TYLENOL Take by mouth.   albuterol 108 (90 Base) MCG/ACT inhaler Commonly known as: VENTOLIN HFA 2 puffs in lungs every 6 hours as needed for wheezing/short of breath.   amLODipine 5 MG tablet Commonly known as: NORVASC TAKE 1 TABLET DAILY   anastrozole 1 MG tablet Commonly known as: ARIMIDEX   [employer]  Pain Ex St 500-32.5 MG Tabs Generic drug: Aspirin-Caffeine Take 1 tablet by mouth daily as needed.   Breo Ellipta 100-25 MCG/INH Aepb Generic drug: fluticasone furoate-vilanterol INHALE 1 PUFF ONCE DAILY   CALCIUM + D + K PO Take 1 tablet by mouth 2 (two) times daily before a meal.   clindamycin 300 MG capsule Commonly known as: CLEOCIN Take 300 mg by mouth 2 (two) times daily. Before Dental procedure   famotidine 40 MG  tablet Commonly known as: PEPCID TAKE 1 TABLET DAILY   fluticasone 50 MCG/ACT nasal spray Commonly known as: FLONASE Place 2 sprays into both nostrils daily.   hydrochlorothiazide 25 MG tablet Commonly known as: HYDRODIURIL TAKE 1 TABLET DAILY   Lysine 500 MG Tabs Take 1 tablet (500 mg total) by mouth 2 (two) times daily before a meal.   melatonin 5 MG Tabs Take 2 tablets (10 mg total) by mouth daily.   meloxicam 15 MG tablet Commonly known as: MOBIC TAKE 1 TABLET ONCE DAILY FOR JOINT AND MUSCLE PAIN   mirabegron ER 50 MG Tb24 tablet Commonly known as: Myrbetriq Take 1 tablet (50 mg total) by mouth daily.   ondansetron 8 MG disintegrating tablet Commonly known as: ZOFRAN-ODT Take 1 tablet (8 mg total) by mouth every 6 (six) hours as needed for nausea or vomiting.   RECLAST IV Inject into the vein.   Restasis 0.05 % ophthalmic emulsion Generic drug: cycloSPORINE Place 1 drop into both eyes 2 (two) times daily as needed.   rosuvastatin 20 MG tablet Commonly known as: CRESTOR Take 1 tablet (20 mg total) by mouth daily.   traZODone 50 MG tablet Commonly known as: DESYREL Take 1 tablet (50 mg total) by mouth at bedtime.   valACYclovir 500 MG tablet Commonly known as: VALTREX TAKE (1) TABLET DAILY AS NEEDED.       History (reviewed): Past Medical History:  Diagnosis Date   Anxiety    GERD (gastroesophageal reflux disease)    Humerus fracture 2007   Hyperlipidemia    Hypertension    Insomnia    Past Surgical History:  Procedure Laterality Date   BREAST LUMPECTOMY Right 02/2019   BREAST SURGERY Right 01/2019   breast biopsy   JOINT REPLACEMENT     left knee   Left shoulder surgery     2007   TOTAL KNEE ARTHROPLASTY     2012 left   TUBAL LIGATION     Family History  Problem Relation Age of Onset   Alzheimer's disease Mother    Diabetes Mother    Stroke Father    Hypertension Father    Cancer Brother    Social History    Socioeconomic History   Marital status: Married    Spouse name: Jori Moll   Number of children: 3   Years of education: 16   Highest education level: Bachelor's degree (e.g., BA, AB, BS)  Occupational History   Occupation: retired  Tobacco Use   Smoking status: Current Every Day Smoker    Packs/day: 0.25    Years: 40.00    Pack years: 10.00    Types: Cigarettes    Last attempt to quit: 09/17/2014    Years since quitting: 5.8   Smokeless tobacco: Never Used   Tobacco comment: electronic cigarettes x 2 weeks. She was smoking 7 cigarettes a day x 40 yrs  Vaping Use   Vaping Use: Former   Start date: 07/18/2018   Quit date: 07/19/2019  Substance and Sexual Activity  Alcohol use: Yes    Alcohol/week: 0.0 standard drinks    Comment: rarely-   Drug use: No   Sexual activity: Not Currently  Other Topics Concern   Not on file  Social History Narrative   Not on file   Social Determinants of Health   Financial Resource Strain: Low Risk    Difficulty of Paying Living Expenses: Not hard at all  Food Insecurity: No Food Insecurity   Worried About Charity fundraiser in the Last Year: Never true   [place] in the Last Year: Never true  Transportation Needs: No Transportation Needs   Lack of Transportation (Medical): No   Lack of Transportation (Non-Medical): No  Physical Activity: Inactive   Days of Exercise per Week: 0 days   Minutes of Exercise per Session: 0 min  Stress: No Stress Concern Present   Feeling of Stress : Not at all  Social Connections: Socially Integrated   Frequency of Communication with Friends and Family: More than three times a week   Frequency of Social Gatherings with Friends and Family: More than three times a week   Attends Religious Services: More than 4 times per year   Active Member of [employer] or Organizations: Yes   Attends Archivist Meetings: More than 4 times per year   Marital Status: Married     Activities of Daily Living In your present state of health, do you have any difficulty performing the following activities: 08/01/2020 08/01/2020  Hearing? N N  Vision? N N  Difficulty concentrating or making decisions? N N  Walking or climbing stairs? Y N  Comment Right Knee pain -  Dressing or bathing? N N  Doing errands, shopping? N N  Preparing Food and eating ? N N  Using the Toilet? N N  In the past six months, have you accidently leaked urine? Y N  Do you have problems with loss of bowel control? N N  Managing your Medications? N N  Managing your Finances? N N  Housekeeping or managing your Housekeeping? N N  Some recent data might be hidden    Patient Education/ Literacy How often do you need to have someone help you when you read instructions, pamphlets, or other written materials from your doctor or pharmacy?: 1 - Never  Exercise Current Exercise Habits: The patient does not participate in regular exercise at present, Exercise limited by: orthopedic condition(s)  Diet Patient reports consuming 3 meals a day and 2 snack(s) a day Patient reports that her primary diet is: Regular Patient reports that she does have regular access to food.   Depression Screen PHQ 2/9 Scores 08/01/2020 08/01/2020 06/12/2020 03/20/2020 12/22/2019 12/14/2019 07/28/2019  PHQ - 2 Score 0 0 0 0 0 0 0     Fall Risk Fall Risk  08/01/2020 06/12/2020 03/20/2020 12/22/2019 12/14/2019  Falls in the past year? 0 0 0 0 0  Number falls in past yr: 0 0 0 0 -  Injury with Fall? 0 0 0 0 -  Risk for fall due to : No Fall Risks No Fall Risks No Fall Risks Impaired mobility -  Follow up Falls evaluation completed Falls evaluation completed Falls evaluation completed Falls evaluation completed -  Comment - - - - -     Objective:  Renee Maldonado seemed alert and oriented and she participated appropriately during our telephone visit.  Blood Pressure Weight BMI  BP Readings from Last 3 Encounters:  06/12/20 115/70   03/20/20  136/79  12/22/19 127/73   Wt Readings from Last 3 Encounters:  08/01/20 199 lb 4.7 oz (90.4 kg)  06/12/20 199 lb 6.4 oz (90.4 kg)  03/20/20 204 lb 9.6 oz (92.8 kg)   BMI Readings from Last 1 Encounters:  08/01/20 37.66 kg/m    *Unable to obtain current vital signs, weight, and BMI due to telephone visit type  Hearing/Vision   Renee Maldonado did not seem to have difficulty with hearing/understanding during the telephone conversation  Reports that she has not had a formal eye exam by an eye care professional within the past year  Reports that she has not had a formal hearing evaluation within the past year *Unable to fully assess hearing and vision during telephone visit type  Cognitive Function: 6CIT Screen 08/01/2020 07/28/2019  What Year? 0 points 0 points  What month? 0 points 0 points  What time? 0 points 0 points  Count back from 20 0 points 0 points  Months in reverse 0 points 0 points  Repeat phrase 0 points 0 points  Total Score 0 0   (Normal:0-7, Significant for Dysfunction: >8)  Normal Cognitive Function Screening: Yes   Immunization & Health Maintenance Record Immunization History  Administered Date(s) Administered   Fluad Quad(high Dose 65+) 07/20/2019   H1N1 10/24/2008   Influenza Nasal 07/19/2011   Influenza Split 07/28/2013   Influenza,inj,Quad PF,6+ Mos 08/30/2018   Influenza,inj,quad, With Preservative 08/24/2017   Influenza-Unspecified 08/23/2014, 08/07/2015, 08/15/2016, 08/30/2018, 07/20/2019   Moderna SARS-COVID-2 Vaccination 1946-09-26, 01/24/2020   Pneumococcal Conjugate-13 08/23/2014   Pneumococcal Polysaccharide-23 08/30/2015   Tdap 06/29/2013    Health Maintenance  Topic Date Due   COLON CANCER SCREENING ANNUAL FOBT  04/29/2017   MAMMOGRAM  02/09/2020   DEXA SCAN  02/09/2020   INFLUENZA VACCINE  06/17/2020   COLONOSCOPY  01/09/2021   TETANUS/TDAP  06/30/2023   COVID-19 Vaccine  Completed   Hepatitis C Screening   Completed   PNA vac Low Risk Adult  Completed       Assessment  This is a routine wellness examination for Renee Maldonado.  Health Maintenance: Due or Overdue Health Maintenance Due  Topic Date Due   COLON CANCER SCREENING ANNUAL FOBT  04/29/2017   MAMMOGRAM  02/09/2020   DEXA SCAN  02/09/2020   INFLUENZA VACCINE  06/17/2020    Renee Maldonado does not need a referral for Community Assistance: Care Management:   no Social Work:    not applicable Prescription Assistance:  no Nutrition/Diabetes Education:  no   Plan:  Personalized Goals Goals Addressed            This Visit's Progress    Exercise 3x per week (30 min per time)       08/01/2020 AWV Goal: Exercise for General Health   Patient will verbalize understanding of the benefits of increased physical activity:  Exercising regularly is important. It will improve your overall fitness, flexibility, and endurance.  Regular exercise also will improve your overall health. It can help you control your weight, reduce stress, and improve your bone density.  Over the next year, patient will increase physical activity as tolerated with a goal of at least 150 minutes of moderate physical activity per week.   You can tell that you are exercising at a moderate intensity if your heart starts beating faster and you start breathing faster but can still hold a conversation.  Moderate-intensity exercise ideas include:  Walking 1 mile (1.6 km) in about 15 minutes  Biking  Hiking  Golfing  Dancing  Water aerobics  Patient will verbalize understanding of everyday activities that increase physical activity by providing examples like the following: ? Yard work, such as: ? Pushing a Conservation officer, nature ? Raking and bagging leaves ? Washing your car ? Pushing a stroller ? Shoveling snow ? Gardening ? Washing windows or floors  Patient will be able to explain general safety guidelines for exercising:   Before you start a new  exercise program, talk with your health care provider.  Do not exercise so much that you hurt yourself, feel dizzy, or get very short of breath.  Wear comfortable clothes and wear shoes with good support.  Drink plenty of water while you exercise to prevent dehydration or heat stroke.  Work out until your breathing and your heartbeat get faster.       Personalized Health Maintenance & Screening Recommendations  Influenza vaccine Screening mammography Bone densitometry screening Colorectal cancer screening Glaucoma screening Smoking cessation counseling Advanced directives: has an advanced directive - a copy HAS NOT been provided. Shingrix  Lung Cancer Screening Recommended: yes (Low Dose CT Chest recommended if Age 93-80 years, 30 pack-year currently smoking OR have quit w/in past 15 years) Hepatitis C Screening recommended: no HIV Screening recommended: no  Advanced Directives: Written information was not prepared per patient's request.  Referrals & Orders No orders of the defined types were placed in this encounter.   Follow-up Plan  Follow-up with Claretta Fraise, MD as planned  Schedule Mammogram, Eye Exam and Dexa   I have personally reviewed and noted the following in the patients chart:    Medical and social history  Use of alcohol, tobacco or illicit drugs   Current medications and supplements  Functional ability and status  Nutritional status  Physical activity  Advanced directives  List of other physicians  Hospitalizations, surgeries, and ER visits in previous 12 months  Vitals  Screenings to include cognitive, depression, and falls  Referrals and appointments  In addition, I have reviewed and discussed with Renee Maldonado certain preventive protocols, quality metrics, and best practice recommendations. A written personalized care plan for preventive services as well as general preventive health recommendations is available and can be mailed  to the patient at her request.      Wardell Heath, LPN  [id number]   AVS printed and mailed to patient   I have reviewed and agree with the above AWV documentation.  Claretta Fraise, M.D.

## 2020-08-20 DIAGNOSIS — R69 Illness, unspecified: Secondary | ICD-10-CM | POA: Diagnosis not present

## 2020-08-27 ENCOUNTER — Other Ambulatory Visit: Payer: Self-pay | Admitting: Family Medicine

## 2020-08-28 ENCOUNTER — Telehealth: Payer: Self-pay

## 2020-09-27 ENCOUNTER — Encounter (HOSPITAL_COMMUNITY): Payer: Self-pay

## 2020-09-27 NOTE — Progress Notes (Addendum)
PCP - Claretta Fraise, MD clearance 08-15-20 on chart Cardiologist - no  PPM/ICD -  Device Orders -  Rep Notified -   Chest x-ray -  EKG - 10-02-20 Stress Test -  ECHO -  Cardiac Cath -   Sleep Study -  CPAP -   Fasting Blood Sugar -  Checks Blood Sugar _____ times a day  Blood Thinner Instructions: Aspirin Instructions:  ERAS Protcol - PRE-SURGERY Ensure  COVID TEST- 11-18  Activity- limited due to knee pain able to do housework without SOB. Can walk stairs slowly without SOB Anesthesia review: HTN , COPD  Patient denies shortness of breath, fever, cough and chest pain at PAT appointment   NONE   All instructions explained to the patient, with a verbal understanding of the material. Patient agrees to go over the instructions while at home for a better understanding. Patient also instructed to self quarantine after being tested for COVID-19. The opportunity to ask questions was provided.

## 2020-09-27 NOTE — Patient Instructions (Signed)
DUE TO COVID-19 ONLY ONE VISITOR IS ALLOWED TO COME WITH YOU AND STAY IN THE WAITING ROOM ONLY DURING PRE OP AND PROCEDURE DAY OF SURGERY. THE 1 VISITOR  MAY VISIT WITH YOU AFTER SURGERY IN YOUR PRIVATE ROOM DURING VISITING HOURS ONLY!  YOU NEED TO HAVE A COVID 19 TEST ON__11-18-21_____ @_______ , THIS TEST MUST BE DONE BEFORE SURGERY,  COVID TESTING SITE [address] [place] [address], IT IS ON THE RIGHT GOING OUT [address] APPROXIMATELY  2 MINUTES PAST [employer] ON THE RIGHT. ONCE YOUR COVID TEST IS COMPLETED,  PLEASE BEGIN THE QUARANTINE INSTRUCTIONS AS OUTLINED IN YOUR HANDOUT.                Renee Maldonado  09/27/2020   Your procedure is scheduled on: 10-08-20   Report to [facility] Main  Entrance   Report to admitting at      1115  AM     Call this number if you have problems the morning of surgery [phone]    Remember:  NO SOLID FOOD AFTER MIDNIGHT THE NIGHT PRIOR TO SURGERY. NOTHING BY MOUTH EXCEPT CLEAR LIQUIDS UNTIL   1045am . PLEASE FINISH ENSURE DRINK PER SURGEON ORDER  WHICH NEEDS TO BE COMPLETED AT       1045 am then nothing by mouth.   BRUSH YOUR TEETH MORNING OF SURGERY AND RINSE YOUR MOUTH OUT, NO CHEWING GUM CANDY OR MINTS.     Take these medicines the morning of surgery with A SIP OF WATER: famotadine, chlorpheniramine, Arimidex, amlodipine, inhaler bring with you                                  You may not have any metal on your body including hair pins and              piercings  Do not wear jewelry, make-up, lotions, powders or perfumes, deodorant             Do not wear nail polish on your fingernails.  Do not shave  48 hours prior to surgery.             Do not bring valuables to the hospital. [place].  Contacts, dentures or bridgework may not be worn into surgery.      Patients discharged the day of surgery will not be allowed to drive home. IF YOU ARE HAVING  SURGERY AND GOING HOME THE SAME DAY, YOU MUST HAVE AN ADULT TO DRIVE YOU HOME AND BE WITH YOU FOR 24 HOURS. YOU MAY GO HOME BY TAXI OR UBER OR ORTHERWISE, BUT AN ADULT MUST ACCOMPANY YOU HOME AND STAY WITH YOU FOR 24 HOURS.  Name and phone number of your driver:  Special Instructions: N/A              Please read over the following fact sheets you were given: _____________________________________________________________________             [facility] - Preparing for Surgery Before surgery, you can play an important role.  Because skin is not sterile, your skin needs to be as free of germs as possible.  You can reduce the number of germs on your skin by washing with CHG (chlorahexidine gluconate) soap before surgery.  CHG is an antiseptic cleaner  which kills germs and bonds with the skin to continue killing germs even after washing. Please DO NOT use if you have an allergy to CHG or antibacterial soaps.  If your skin becomes reddened/irritated stop using the CHG and inform your nurse when you arrive at Short Stay. Do not shave (including legs and underarms) for at least 48 hours prior to the first CHG shower.  You may shave your face/neck. Please follow these instructions carefully:  1.  Shower with CHG Soap the night before surgery and the  morning of Surgery.  2.  If you choose to wash your hair, wash your hair first as usual with your  normal  shampoo.  3.  After you shampoo, rinse your hair and body thoroughly to remove the  shampoo.                           4.  Use CHG as you would any other liquid soap.  You can apply chg directly  to the skin and wash                       Gently with a scrungie or clean washcloth.  5.  Apply the CHG Soap to your body ONLY FROM THE NECK DOWN.   Do not use on face/ open                           Wound or open sores. Avoid contact with eyes, ears mouth and genitals (private parts).                       Wash face,  Genitals (private parts) with your normal  soap.             6.  Wash thoroughly, paying special attention to the area where your surgery  will be performed.  7.  Thoroughly rinse your body with warm water from the neck down.  8.  DO NOT shower/wash with your normal soap after using and rinsing off  the CHG Soap.                9.  Pat yourself dry with a clean towel.            10.  Wear clean pajamas.            11.  Place clean sheets on your bed the night of your first shower and do not  sleep with pets. Day of Surgery : Do not apply any lotions/deodorants the morning of surgery.  Please wear clean clothes to the hospital/surgery center.  FAILURE TO FOLLOW THESE INSTRUCTIONS MAY RESULT IN THE CANCELLATION OF YOUR SURGERY PATIENT SIGNATURE_________________________________  NURSE SIGNATURE__________________________________  ________________________________________________________________________   Adam Phenix  An incentive spirometer is a tool that can help keep your lungs clear and active. This tool measures how well you are filling your lungs with each breath. Taking long deep breaths may help reverse or decrease the chance of developing breathing (pulmonary) problems (especially infection) following:  A long period of time when you are unable to move or be active. BEFORE THE PROCEDURE   If the spirometer includes an indicator to show your best effort, your nurse or respiratory therapist will set it to a desired goal.  If possible, sit up straight or lean slightly forward. Try not to slouch.  Hold the incentive spirometer in  an upright position. INSTRUCTIONS FOR USE  1. Sit on the edge of your bed if possible, or sit up as far as you can in bed or on a chair. 2. Hold the incentive spirometer in an upright position. 3. Breathe out normally. 4. Place the mouthpiece in your mouth and seal your lips tightly around it. 5. Breathe in slowly and as deeply as possible, raising the piston or the ball toward the top of  the column. 6. Hold your breath for 3-5 seconds or for as long as possible. Allow the piston or ball to fall to the bottom of the column. 7. Remove the mouthpiece from your mouth and breathe out normally. 8. Rest for a few seconds and repeat Steps 1 through 7 at least 10 times every 1-2 hours when you are awake. Take your time and take a few normal breaths between deep breaths. 9. The spirometer may include an indicator to show your best effort. Use the indicator as a goal to work toward during each repetition. 10. After each set of 10 deep breaths, practice coughing to be sure your lungs are clear. If you have an incision (the cut made at the time of surgery), support your incision when coughing by placing a pillow or rolled up towels firmly against it. Once you are able to get out of bed, walk around indoors and cough well. You may stop using the incentive spirometer when instructed by your caregiver.  RISKS AND COMPLICATIONS  Take your time so you do not get dizzy or light-headed.  If you are in pain, you may need to take or ask for pain medication before doing incentive spirometry. It is harder to take a deep breath if you are having pain. AFTER USE  Rest and breathe slowly and easily.  It can be helpful to keep track of a log of your progress. Your caregiver can provide you with a simple table to help with this. If you are using the spirometer at home, follow these instructions: [place] IF:   You are having difficultly using the spirometer.  You have trouble using the spirometer as often as instructed.  Your pain medication is not giving enough relief while using the spirometer.  You develop fever of 100.5 F (38.1 C) or higher. SEEK IMMEDIATE MEDICAL CARE IF:   You cough up bloody sputum that had not been present before.  You develop fever of 102 F (38.9 C) or greater.  You develop worsening pain at or near the incision site. MAKE SURE YOU:   Understand these  instructions.  Will watch your condition.  Will get help right away if you are not doing well or get worse. Document Released: 03/16/2007 Document Revised: 01/26/2012 Document Reviewed: 05/17/2007 [facility] Patient Information 2014 [place], [place].   ________________________________________________________________________

## 2020-10-02 ENCOUNTER — Encounter (HOSPITAL_COMMUNITY)
Admission: RE | Admit: 2020-10-02 | Discharge: 2020-10-02 | Disposition: A | Discharge status: Home / Self Care | Payer: Medicare HMO | Source: Ambulatory Visit | Attending: Orthopedic Surgery | Admitting: Orthopedic Surgery

## 2020-10-02 ENCOUNTER — Other Ambulatory Visit: Payer: Self-pay

## 2020-10-02 ENCOUNTER — Encounter (HOSPITAL_COMMUNITY): Payer: Self-pay

## 2020-10-02 DIAGNOSIS — Z01818 Encounter for other preprocedural examination: Secondary | ICD-10-CM | POA: Insufficient documentation

## 2020-10-02 HISTORY — DX: Chronic obstructive pulmonary disease, unspecified: J44.9

## 2020-10-02 HISTORY — DX: Unspecified osteoarthritis, unspecified site: M19.90

## 2020-10-02 HISTORY — DX: Malignant (primary) neoplasm, unspecified: C80.1

## 2020-10-02 LAB — COMPREHENSIVE METABOLIC PANEL
ALT: 23 U/L (ref 0–44)
AST: 23 U/L (ref 15–41)
Albumin: 4.9 g/dL (ref 3.5–5.0)
Alkaline Phosphatase: 74 U/L (ref 38–126)
Anion gap: 13 (ref 5–15)
BUN: 18 mg/dL (ref 8–23)
CO2: 26 mmol/L (ref 22–32)
Calcium: 9.9 mg/dL (ref 8.9–10.3)
Chloride: 99 mmol/L (ref 98–111)
Creatinine, Ser: 1.41 mg/dL — ABNORMAL HIGH (ref 0.44–1.00)
GFR, Estimated: 39 mL/min — ABNORMAL LOW (ref >60)
Glucose, Bld: 110 mg/dL — ABNORMAL HIGH (ref 70–99)
Potassium: 3.8 mmol/L (ref 3.5–5.1)
Sodium: 138 mmol/L (ref 135–145)
Total Bilirubin: 0.8 mg/dL (ref 0.3–1.2)
Total Protein: 7.5 g/dL (ref 6.5–8.1)

## 2020-10-02 LAB — PROTIME-INR
INR: 0.9 (ref 0.8–1.2)
Prothrombin Time: 12.2 seconds (ref 11.4–15.2)

## 2020-10-02 LAB — CBC
HCT: 35.6 % — ABNORMAL LOW (ref 36.0–46.0)
Hemoglobin: 11.3 g/dL — ABNORMAL LOW (ref 12.0–15.0)
MCH: 27.5 pg (ref 26.0–34.0)
MCHC: 31.7 g/dL (ref 30.0–36.0)
MCV: 86.6 fL (ref 80.0–100.0)
Platelets: 312 10*3/uL (ref 150–400)
RBC: 4.11 MIL/uL (ref 3.87–5.11)
RDW: 13.8 % (ref 11.5–15.5)
WBC: 5.4 10*3/uL (ref 4.0–10.5)
nRBC: 0 % (ref 0.0–0.2)

## 2020-10-02 LAB — SURGICAL PCR SCREEN
MRSA, PCR: NEGATIVE
Staphylococcus aureus: NEGATIVE

## 2020-10-02 LAB — APTT: aPTT: 31 seconds (ref 24–36)

## 2020-10-03 NOTE — H&P (Signed)
TOTAL KNEE ADMISSION H&P  Patient is being admitted for right total knee arthroplasty.  Subjective:  Chief Complaint: Right knee pain.  HPI: Renee Maldonado, 74 y.o. female has a history of pain and functional disability in the right knee due to arthritis and has failed non-surgical conservative treatments for greater than 12 weeks to include corticosteriod injections and activity modification. Onset of symptoms was gradual, starting >10 years ago with gradually worsening course since that time. The patient noted no past surgery on the right knee.  Patient currently rates pain in the right knee at 8 out of 10 with activity. Patient has worsening of pain with activity and weight bearing, pain that interferes with activities of daily living and crepitus. Patient has evidence of severe bone-on-bone arthritis, tricompartmental in nature, in the right knee. She has large osteophytes by imaging studies.. There is no active infection.  Patient Active Problem List   Diagnosis Date Noted  . Morbid obesity ([place]) 03/08/2015  . Vitamin D deficiency 03/08/2015  . COPD (chronic obstructive pulmonary disease) ([place]) 08/11/2014  . Fall 12/19/2013  . Hyperlipidemia 06/29/2013  . Hypertension 06/29/2013  . GAD (generalized anxiety disorder) 06/29/2013  . Diverticulosis of colon without hemorrhage 06/29/2013  . GERD (gastroesophageal reflux disease) 06/29/2013  . Insomnia 06/29/2013  . Osteopenia 06/29/2013    Past Medical History:  Diagnosis Date  . Anxiety   . Arthritis   . Cancer [facility])    breast Right no chemo or radiation  on Arimidex  . COPD (chronic obstructive pulmonary disease) ([place])    on Breo  . GERD (gastroesophageal reflux disease)   . Humerus fracture 2007  . Hyperlipidemia   . Hypertension   . Insomnia     Past Surgical History:  Procedure Laterality Date  . BREAST LUMPECTOMY Right 02/2019  . BREAST SURGERY Right 01/2019   breast biopsy  . JOINT REPLACEMENT     left knee  . Left  shoulder surgery     2007  . TOTAL KNEE ARTHROPLASTY     2012 left  . TUBAL LIGATION      Prior to Admission medications   Medication Sig Start Date End Date Taking? Authorizing Provider  acetaminophen (TYLENOL) 500 MG tablet Take 500 mg by mouth every 6 (six) hours as needed for moderate pain.    Yes [provider]  albuterol (VENTOLIN HFA) 108 (90 Base) MCG/ACT inhaler 2 puffs in lungs every 6 hours as needed for wheezing/short of breath. Patient taking differently: Inhale 2 puffs into the lungs every 6 (six) hours as needed for wheezing or shortness of breath.  06/13/20  Yes Stacks, Cletus Gash, MD  amLODipine (NORVASC) 5 MG tablet TAKE 1 TABLET DAILY Patient taking differently: Take 5 mg by mouth daily.  07/16/20  Yes Stacks, Cletus Gash, MD  anastrozole (ARIMIDEX) 1 MG tablet Take 1 mg by mouth daily.  07/22/19  Yes [provider]  aspirin 325 MG tablet Take 325 mg by mouth every 6 (six) hours as needed for moderate pain.   Yes [provider]  Calcium Carb-Cholecalciferol (CALCIUM 600/VITAMIN D PO) Take 1 tablet by mouth daily.   Yes [provider]  chlorpheniramine (CHLOR-TRIMETON) 4 MG tablet Take 4 mg by mouth in the morning and at bedtime.    Yes [provider]  cholecalciferol (VITAMIN D3) 25 MCG (1000 UNIT) tablet Take 1,000 Units by mouth daily.   Yes [provider]  clindamycin (CLEOCIN) 300 MG capsule Take 300 mg by mouth See admin instructions.  Take 600 mg by mouth one hour prior to dental appointment. 12/13/19  Yes [provider]  famotidine (PEPCID) 40 MG tablet TAKE 1 TABLET DAILY Patient taking differently: Take 40 mg by mouth daily.  08/27/20  Yes Stacks, Cletus Gash, MD  fluticasone furoate-vilanterol (BREO ELLIPTA) 100-25 MCG/INH AEPB INHALE 1 PUFF ONCE DAILY Patient taking differently: Inhale 1 puff into the lungs daily.  12/14/19  Yes Stacks, Cletus Gash, MD  hydrochlorothiazide (HYDRODIURIL) 25 MG tablet TAKE 1 TABLET  DAILY Patient taking differently: Take 25 mg by mouth daily.  06/18/20  Yes Claretta Fraise, MD  Lysine 500 MG TABS Take 1 tablet (500 mg total) by mouth 2 (two) times daily before a meal. 09/27/18  Yes Stacks, Cletus Gash, MD  Melatonin 10 MG TABS Take 10 mg by mouth at bedtime.   Yes [provider]  meloxicam (MOBIC) 15 MG tablet TAKE 1 TABLET ONCE DAILY FOR JOINT AND MUSCLE PAIN Patient taking differently: Take 15 mg by mouth daily.  06/18/20  Yes Stacks, Cletus Gash, MD  RESTASIS 0.05 % ophthalmic emulsion Place 1 drop into both eyes 2 (two) times daily as needed (dry eyes).  01/18/14  Yes [provider]  rosuvastatin (CRESTOR) 20 MG tablet Take 1 tablet (20 mg total) by mouth daily. Patient taking differently: Take 20 mg by mouth at bedtime.  12/14/19  Yes Claretta Fraise, MD  traZODone (DESYREL) 50 MG tablet Take 1 tablet (50 mg total) by mouth at bedtime. 07/17/20  Yes Stacks, Cletus Gash, MD  valACYclovir (VALTREX) 500 MG tablet TAKE (1) TABLET DAILY AS NEEDED. Patient taking differently: Take 500 mg by mouth daily as needed (fever blisters).  12/14/19  Yes Stacks, Cletus Gash, MD  Aspirin-Caffeine (BAYER BACK & BODY PAIN EX ST) 500-32.5 MG TABS Take 1 tablet by mouth daily as needed. Patient not taking: Reported on 09/27/2020    [provider]  Calcium-Vitamin D-Vitamin K (CALCIUM + D + K PO) Take 1 tablet by mouth 2 (two) times daily before a meal.  Patient not taking: Reported on 09/27/2020    [provider]  fluticasone (FLONASE) 50 MCG/ACT nasal spray Place 2 sprays into both nostrils daily. Patient not taking: Reported on 09/27/2020 03/29/19   Claretta Fraise, MD  Melatonin 5 MG TABS Take 2 tablets (10 mg total) by mouth daily. Patient not taking: Reported on 09/27/2020 12/14/19   Claretta Fraise, MD  mirabegron ER (MYRBETRIQ) 50 MG TB24 tablet Take 1 tablet (50 mg total) by mouth daily. Patient not taking: Reported on 09/27/2020 06/12/20   Claretta Fraise, MD  ondansetron  (ZOFRAN-ODT) 8 MG disintegrating tablet Take 1 tablet (8 mg total) by mouth every 6 (six) hours as needed for nausea or vomiting. Patient not taking: Reported on 09/27/2020 12/31/16   Claretta Fraise, MD  Zoledronic Acid (RECLAST IV) Inject into the vein. Patient not taking: Reported on 09/27/2020    [provider]    Allergies  Allergen Reactions  . Benadryl [Diphenhydramine Hcl]   . Penicillins     Had a rash around 20 years ago but unsure if it come from medicine.     Social History   Socioeconomic History  . Marital status: Married    Spouse name: Jori Moll  . Number of children: 3  . Years of education: 16  . Highest education level: Bachelor's degree (e.g., BA, AB, BS)  Occupational History  . Occupation: retired  Tobacco Use  . Smoking status: Current Every Day Smoker    Packs/day: 0.25  Years: 40.00    Pack years: 10.00    Types: Cigarettes    Last attempt to quit: 09/17/2014    Years since quitting: 6.0  . Smokeless tobacco: Never Used  . Tobacco comment: smokes 3 a day  Vaping Use  . Vaping Use: Former  . Start date: 07/18/2018  . Quit date: 07/19/2019  Substance and Sexual Activity  . Alcohol use: Yes    Alcohol/week: 0.0 standard drinks    Comment: rarely-    . Drug use: No  . Sexual activity: Not Currently  Other Topics Concern  . Not on file  Social History Narrative  . Not on file   Social Determinants of Health   Financial Resource Strain: Low Risk   . Difficulty of Paying Living Expenses: Not hard at all  Food Insecurity: No Food Insecurity  . Worried About Charity fundraiser in the Last Year: Never true  . Ran Out of Food in the Last Year: Never true  Transportation Needs: No Transportation Needs  . Lack of Transportation (Medical): No  . Lack of Transportation (Non-Medical): No  Physical Activity: Inactive  . Days of Exercise per Week: 0 days  . Minutes of Exercise per Session: 0 min  Stress: No Stress Concern Present  . Feeling of  Stress : Not at all  Social Connections: Socially Integrated  . Frequency of Communication with Friends and Family: More than three times a week  . Frequency of Social Gatherings with Friends and Family: More than three times a week  . Attends Religious Services: More than 4 times per year  . Active Member of Clubs or Organizations: Yes  . Attends Archivist Meetings: More than 4 times per year  . Marital Status: Married  Human resources officer Violence: Not At Risk  . Fear of Current or Ex-Partner: No  . Emotionally Abused: No  . Physically Abused: No  . Sexually Abused: No      Tobacco Use: High Risk  . Smoking Tobacco Use: Current Every Day Smoker  . Smokeless Tobacco Use: Never Used   Social History   Substance and Sexual Activity  Alcohol Use Yes  . Alcohol/week: 0.0 standard drinks   Comment: rarely-      Family History  Problem Relation Age of Onset  . Alzheimer's disease Mother   . Diabetes Mother   . Stroke Father   . Hypertension Father   . Cancer Brother     Review of Systems  Constitutional: Negative for chills and fever.  HENT: Negative for congestion, sore throat and tinnitus.   Eyes: Negative for double vision, photophobia and pain.  Respiratory: Negative for cough, shortness of breath and wheezing.   Cardiovascular: Negative for chest pain, palpitations and orthopnea.  Gastrointestinal: Negative for heartburn, nausea and vomiting.  Genitourinary: Negative for dysuria, frequency and urgency.  Musculoskeletal: Positive for joint pain.  Neurological: Negative for dizziness, weakness and headaches.    Objective:  Physical Exam: Well nourished and well developed.  General: Alert and oriented x3, cooperative and pleasant, no acute distress.  Head: normocephalic, atraumatic, neck supple.  Eyes: EOMI.  Respiratory: breath sounds clear in all fields, no wheezing, rales, or rhonchi. Cardiovascular: Regular rate and rhythm, no murmurs, gallops or  rubs.  Abdomen: non-tender to palpation and soft, normoactive bowel sounds. Musculoskeletal:  Right Knee Exam:  No effusion.  Range of motion is 5 to 120 degrees.  Marked crepitus on range of motion of the knee.  Positive medial joint  line tenderness.  Slight lateral joint line tenderness.  The knee is stable.   Calves soft and nontender. Motor function intact in LE. Strength 5/5 LE bilaterally. Neuro: Distal pulses 2+. Sensation to light touch intact in LE.  Imaging Review Plain radiographs demonstrate severe degenerative joint disease of the right knee. The overall alignment is neutral. The bone quality appears to be adequate for age and reported activity level.  Assessment/Plan:  End stage arthritis, right knee   The patient history, physical examination, clinical judgment of the provider and imaging studies are consistent with end stage degenerative joint disease of the right knee and total knee arthroplasty is deemed medically necessary. The treatment options including medical management, injection therapy arthroscopy and arthroplasty were discussed at length. The risks and benefits of total knee arthroplasty were presented and reviewed. The risks due to aseptic loosening, infection, stiffness, patella tracking problems, thromboembolic complications and other imponderables were discussed. The patient acknowledged the explanation, agreed to proceed with the plan and consent was signed. Patient is being admitted for inpatient treatment for surgery, pain control, PT, OT, prophylactic antibiotics, VTE prophylaxis, progressive ambulation and ADLs and discharge planning. The patient is planning to be discharged home.   Patient's anticipated LOS is less than 2 midnights, meeting these requirements: - Lives within 1 hour of care - Has a competent adult at home to recover with post-op recover - NO history of  - Chronic pain requiring opioids  - Diabetes  - Coronary Artery Disease  - Heart  failure  - Heart attack  - Stroke  - DVT/VTE  - Cardiac arrhythmia  - Respiratory Failure/COPD  - Renal failure  - Anemia  - Advanced Liver disease  Therapy Plans: Outpatient therapy at [facility]) Disposition: Home with husband Planned DVT Prophylaxis: Xarelto 10 mg QD DME Needed: None PCP: Claretta Fraise, MD (clearance received) TXA: IV Allergies: PCN (rash), benadryl Anesthesia Concerns: None BMI: 34.8 Last HgbA1c: N/A Pharmacy: [facility]  - Patient was instructed on what medications to stop prior to surgery. - Follow-up visit in 2 weeks with Dr. Wynelle Link - Begin physical therapy following surgery - Pre-operative lab work as pre-surgical testing - Prescriptions will be provided in hospital at time of discharge  Theresa Duty, PA-C Orthopedic Surgery [facility] [place]

## 2020-10-04 ENCOUNTER — Other Ambulatory Visit (HOSPITAL_COMMUNITY)
Admission: RE | Admit: 2020-10-04 | Discharge: 2020-10-04 | Disposition: A | Discharge status: Home / Self Care | Payer: Medicare HMO | Source: Ambulatory Visit | Attending: Orthopedic Surgery | Admitting: Orthopedic Surgery

## 2020-10-04 DIAGNOSIS — Z170 Estrogen receptor positive status [ER+]: Secondary | ICD-10-CM | POA: Diagnosis not present

## 2020-10-04 DIAGNOSIS — C50311 Malignant neoplasm of lower-inner quadrant of right female breast: Secondary | ICD-10-CM | POA: Diagnosis not present

## 2020-10-04 DIAGNOSIS — Z20822 Contact with and (suspected) exposure to covid-19: Secondary | ICD-10-CM | POA: Diagnosis not present

## 2020-10-04 DIAGNOSIS — Z01812 Encounter for preprocedural laboratory examination: Secondary | ICD-10-CM | POA: Diagnosis not present

## 2020-10-04 DIAGNOSIS — R928 Other abnormal and inconclusive findings on diagnostic imaging of breast: Secondary | ICD-10-CM | POA: Diagnosis not present

## 2020-10-04 DIAGNOSIS — Z9289 Personal history of other medical treatment: Secondary | ICD-10-CM | POA: Diagnosis not present

## 2020-10-04 LAB — SARS CORONAVIRUS 2 (TAT 6-24 HRS): SARS Coronavirus 2: NEGATIVE

## 2020-10-07 MED ORDER — BUPIVACAINE LIPOSOME 1.3 % IJ SUSP
20.0000 mL | Freq: Once | INTRAMUSCULAR | Status: DC
  Filled 2020-10-07: qty 20

## 2020-10-08 ENCOUNTER — Ambulatory Visit (HOSPITAL_COMMUNITY): Payer: Medicare HMO | Admitting: Anesthesiology

## 2020-10-08 ENCOUNTER — Encounter (HOSPITAL_COMMUNITY)
Admission: RE | Disposition: A | Discharge status: Home / Self Care | Payer: Self-pay | Source: Home / Self Care | Attending: Orthopedic Surgery

## 2020-10-08 ENCOUNTER — Observation Stay (HOSPITAL_COMMUNITY)
Admission: RE | Admit: 2020-10-08 | Discharge: 2020-10-09 | Disposition: A | Discharge status: Home / Self Care | Payer: Medicare HMO | Attending: Orthopedic Surgery | Admitting: Orthopedic Surgery

## 2020-10-08 ENCOUNTER — Encounter (HOSPITAL_COMMUNITY): Payer: Self-pay | Admitting: Orthopedic Surgery

## 2020-10-08 ENCOUNTER — Other Ambulatory Visit: Payer: Self-pay

## 2020-10-08 DIAGNOSIS — M179 Osteoarthritis of knee, unspecified: Secondary | ICD-10-CM

## 2020-10-08 DIAGNOSIS — Z79899 Other long term (current) drug therapy: Secondary | ICD-10-CM | POA: Diagnosis not present

## 2020-10-08 DIAGNOSIS — M1711 Unilateral primary osteoarthritis, right knee: Principal | ICD-10-CM | POA: Insufficient documentation

## 2020-10-08 DIAGNOSIS — I10 Essential (primary) hypertension: Secondary | ICD-10-CM | POA: Diagnosis not present

## 2020-10-08 DIAGNOSIS — M25561 Pain in right knee: Secondary | ICD-10-CM | POA: Diagnosis present

## 2020-10-08 DIAGNOSIS — Z96652 Presence of left artificial knee joint: Secondary | ICD-10-CM | POA: Diagnosis not present

## 2020-10-08 DIAGNOSIS — Z853 Personal history of malignant neoplasm of breast: Secondary | ICD-10-CM | POA: Insufficient documentation

## 2020-10-08 DIAGNOSIS — E785 Hyperlipidemia, unspecified: Secondary | ICD-10-CM | POA: Diagnosis not present

## 2020-10-08 DIAGNOSIS — R69 Illness, unspecified: Secondary | ICD-10-CM | POA: Diagnosis not present

## 2020-10-08 DIAGNOSIS — M1710 Unilateral primary osteoarthritis, unspecified knee: Secondary | ICD-10-CM

## 2020-10-08 DIAGNOSIS — J449 Chronic obstructive pulmonary disease, unspecified: Secondary | ICD-10-CM | POA: Insufficient documentation

## 2020-10-08 DIAGNOSIS — F17210 Nicotine dependence, cigarettes, uncomplicated: Secondary | ICD-10-CM | POA: Insufficient documentation

## 2020-10-08 DIAGNOSIS — M1712 Unilateral primary osteoarthritis, left knee: Secondary | ICD-10-CM | POA: Diagnosis not present

## 2020-10-08 DIAGNOSIS — G8918 Other acute postprocedural pain: Secondary | ICD-10-CM | POA: Diagnosis not present

## 2020-10-08 HISTORY — DX: Osteoarthritis of knee, unspecified: M17.9

## 2020-10-08 HISTORY — DX: Unilateral primary osteoarthritis, unspecified knee: M17.10

## 2020-10-08 HISTORY — PX: TOTAL KNEE ARTHROPLASTY: SHX125

## 2020-10-08 LAB — TYPE AND SCREEN
ABO/RH(D): O POS
Antibody Screen: NEGATIVE

## 2020-10-08 LAB — ABO/RH: ABO/RH(D): O POS

## 2020-10-08 SURGERY — ARTHROPLASTY, KNEE, TOTAL
Anesthesia: Spinal | Site: Knee | Laterality: Right

## 2020-10-08 MED ORDER — DEXAMETHASONE SODIUM PHOSPHATE 10 MG/ML IJ SOLN: 8.0000 mg | Freq: Once | INTRAMUSCULAR | Status: DC

## 2020-10-08 MED ORDER — ROPIVACAINE HCL 7.5 MG/ML IJ SOLN
INTRAMUSCULAR | Status: DC | PRN
  Administered 2020-10-08: 20 mL via PERINEURAL

## 2020-10-08 MED ORDER — FAMOTIDINE 20 MG PO TABS
40.0000 mg | ORAL_TABLET | Freq: Every day | ORAL | Status: DC
  Administered 2020-10-09: 40 mg via ORAL
  Filled 2020-10-08: qty 2

## 2020-10-08 MED ORDER — TRANEXAMIC ACID-NACL 1000-0.7 MG/100ML-% IV SOLN
1000.0000 mg | INTRAVENOUS | Status: AC
  Administered 2020-10-08: 1000 mg via INTRAVENOUS
  Filled 2020-10-08: qty 100

## 2020-10-08 MED ORDER — CYCLOSPORINE 0.05 % OP EMUL
1.0000 [drp] | Freq: Two times a day (BID) | OPHTHALMIC | Status: DC | PRN
  Filled 2020-10-08: qty 1

## 2020-10-08 MED ORDER — BUPIVACAINE IN DEXTROSE 0.75-8.25 % IT SOLN
INTRATHECAL | Status: DC | PRN
  Administered 2020-10-08: 1.6 mL via INTRATHECAL

## 2020-10-08 MED ORDER — PROPOFOL 10 MG/ML IV BOLUS
INTRAVENOUS | Status: AC
  Filled 2020-10-08: qty 20

## 2020-10-08 MED ORDER — ONDANSETRON HCL 4 MG/2ML IJ SOLN
INTRAMUSCULAR | Status: AC
  Filled 2020-10-08: qty 2

## 2020-10-08 MED ORDER — GABAPENTIN 300 MG PO CAPS
300.0000 mg | ORAL_CAPSULE | Freq: Three times a day (TID) | ORAL | Status: DC
  Administered 2020-10-08 – 2020-10-09 (×2): 300 mg via ORAL
  Filled 2020-10-08 (×2): qty 1

## 2020-10-08 MED ORDER — TRAMADOL HCL 50 MG PO TABS
50.0000 mg | ORAL_TABLET | Freq: Four times a day (QID) | ORAL | Status: DC | PRN
  Administered 2020-10-08 – 2020-10-09 (×2): 100 mg via ORAL
  Filled 2020-10-08 (×2): qty 2

## 2020-10-08 MED ORDER — METHOCARBAMOL 500 MG PO TABS
500.0000 mg | ORAL_TABLET | Freq: Four times a day (QID) | ORAL | Status: DC | PRN
  Administered 2020-10-08: 500 mg via ORAL
  Filled 2020-10-08: qty 1

## 2020-10-08 MED ORDER — METOCLOPRAMIDE HCL 5 MG PO TABS: 5.0000 mg | ORAL_TABLET | Freq: Three times a day (TID) | ORAL | Status: DC | PRN

## 2020-10-08 MED ORDER — SODIUM CHLORIDE 0.9 % IR SOLN
Status: DC | PRN
  Administered 2020-10-08: 1000 mL

## 2020-10-08 MED ORDER — ACETAMINOPHEN 325 MG PO TABS: 325.0000 mg | ORAL_TABLET | Freq: Once | ORAL | Status: DC | PRN

## 2020-10-08 MED ORDER — LACTATED RINGERS IV SOLN: INTRAVENOUS | Status: DC

## 2020-10-08 MED ORDER — ROSUVASTATIN CALCIUM 20 MG PO TABS
20.0000 mg | ORAL_TABLET | Freq: Every day | ORAL | Status: DC
  Administered 2020-10-08: 20 mg via ORAL
  Filled 2020-10-08: qty 1

## 2020-10-08 MED ORDER — ONDANSETRON HCL 4 MG PO TABS: 4.0000 mg | ORAL_TABLET | Freq: Four times a day (QID) | ORAL | Status: DC | PRN

## 2020-10-08 MED ORDER — CEFAZOLIN SODIUM-DEXTROSE 2-4 GM/100ML-% IV SOLN
2.0000 g | INTRAVENOUS | Status: AC
  Administered 2020-10-08: 2 g via INTRAVENOUS
  Filled 2020-10-08: qty 100

## 2020-10-08 MED ORDER — BISACODYL 10 MG RE SUPP: 10.0000 mg | Freq: Every day | RECTAL | Status: DC | PRN

## 2020-10-08 MED ORDER — SODIUM CHLORIDE 0.9 % IV SOLN: INTRAVENOUS | Status: DC

## 2020-10-08 MED ORDER — METOCLOPRAMIDE HCL 5 MG/ML IJ SOLN: 5.0000 mg | Freq: Three times a day (TID) | INTRAMUSCULAR | Status: DC | PRN

## 2020-10-08 MED ORDER — DIPHENHYDRAMINE HCL 12.5 MG/5ML PO ELIX: 12.5000 mg | ORAL_SOLUTION | ORAL | Status: DC | PRN

## 2020-10-08 MED ORDER — CEFAZOLIN SODIUM-DEXTROSE 2-4 GM/100ML-% IV SOLN
2.0000 g | Freq: Four times a day (QID) | INTRAVENOUS | Status: AC
  Administered 2020-10-08 – 2020-10-09 (×2): 2 g via INTRAVENOUS
  Filled 2020-10-08 (×2): qty 100

## 2020-10-08 MED ORDER — PHENYLEPHRINE 40 MCG/ML (10ML) SYRINGE FOR IV PUSH (FOR BLOOD PRESSURE SUPPORT)
PREFILLED_SYRINGE | INTRAVENOUS | Status: AC
  Filled 2020-10-08: qty 10

## 2020-10-08 MED ORDER — PROPOFOL 10 MG/ML IV BOLUS
INTRAVENOUS | Status: DC | PRN
  Administered 2020-10-08: 20 mg via INTRAVENOUS
  Administered 2020-10-08: 10 mg via INTRAVENOUS
  Administered 2020-10-08: 20 mg via INTRAVENOUS
  Administered 2020-10-08: 10 mg via INTRAVENOUS
  Administered 2020-10-08: 20 mg via INTRAVENOUS

## 2020-10-08 MED ORDER — DEXAMETHASONE SODIUM PHOSPHATE 10 MG/ML IJ SOLN
10.0000 mg | Freq: Once | INTRAMUSCULAR | Status: AC
  Administered 2020-10-09: 10 mg via INTRAVENOUS
  Filled 2020-10-08: qty 1

## 2020-10-08 MED ORDER — PHENYLEPHRINE HCL-NACL 10-0.9 MG/250ML-% IV SOLN
INTRAVENOUS | Status: DC | PRN
  Administered 2020-10-08: 25 ug/min via INTRAVENOUS

## 2020-10-08 MED ORDER — MENTHOL 3 MG MT LOZG: 1.0000 | LOZENGE | OROMUCOSAL | Status: DC | PRN

## 2020-10-08 MED ORDER — MORPHINE SULFATE (PF) 2 MG/ML IV SOLN: 0.5000 mg | INTRAVENOUS | Status: DC | PRN

## 2020-10-08 MED ORDER — POVIDONE-IODINE 10 % EX SWAB
2.0000 "application " | Freq: Once | CUTANEOUS | Status: AC
  Administered 2020-10-08: 2 via TOPICAL

## 2020-10-08 MED ORDER — PHENYLEPHRINE HCL (PRESSORS) 10 MG/ML IV SOLN
INTRAVENOUS | Status: AC
  Filled 2020-10-08: qty 1

## 2020-10-08 MED ORDER — ONDANSETRON HCL 4 MG/2ML IJ SOLN
INTRAMUSCULAR | Status: DC | PRN
  Administered 2020-10-08: 4 mg via INTRAVENOUS

## 2020-10-08 MED ORDER — AMISULPRIDE (ANTIEMETIC) 5 MG/2ML IV SOLN: 10.0000 mg | Freq: Once | INTRAVENOUS | Status: DC | PRN

## 2020-10-08 MED ORDER — ACETAMINOPHEN 10 MG/ML IV SOLN: 1000.0000 mg | Freq: Once | INTRAVENOUS | Status: DC | PRN

## 2020-10-08 MED ORDER — MEPERIDINE HCL 50 MG/ML IJ SOLN: 6.2500 mg | INTRAMUSCULAR | Status: DC | PRN

## 2020-10-08 MED ORDER — ACETAMINOPHEN 10 MG/ML IV SOLN
1000.0000 mg | Freq: Four times a day (QID) | INTRAVENOUS | Status: DC
  Administered 2020-10-08: 1000 mg via INTRAVENOUS
  Filled 2020-10-08: qty 100

## 2020-10-08 MED ORDER — ONDANSETRON HCL 4 MG/2ML IJ SOLN: 4.0000 mg | Freq: Four times a day (QID) | INTRAMUSCULAR | Status: DC | PRN

## 2020-10-08 MED ORDER — FLEET ENEMA 7-19 GM/118ML RE ENEM: 1.0000 | ENEMA | Freq: Once | RECTAL | Status: DC | PRN

## 2020-10-08 MED ORDER — FENTANYL CITRATE (PF) 100 MCG/2ML IJ SOLN
50.0000 ug | INTRAMUSCULAR | Status: DC
  Administered 2020-10-08: 25 ug via INTRAVENOUS
  Filled 2020-10-08: qty 2

## 2020-10-08 MED ORDER — POLYETHYLENE GLYCOL 3350 17 G PO PACK: 17.0000 g | PACK | Freq: Every day | ORAL | Status: DC | PRN

## 2020-10-08 MED ORDER — ACETAMINOPHEN 160 MG/5ML PO SOLN: 325.0000 mg | Freq: Once | ORAL | Status: DC | PRN

## 2020-10-08 MED ORDER — DEXAMETHASONE SODIUM PHOSPHATE 10 MG/ML IJ SOLN
INTRAMUSCULAR | Status: DC | PRN
  Administered 2020-10-08: 4 mg via INTRAVENOUS

## 2020-10-08 MED ORDER — 0.9 % SODIUM CHLORIDE (POUR BTL) OPTIME
TOPICAL | Status: DC | PRN
  Administered 2020-10-08: 1000 mL

## 2020-10-08 MED ORDER — SODIUM CHLORIDE (PF) 0.9 % IJ SOLN
INTRAMUSCULAR | Status: AC
  Filled 2020-10-08: qty 50

## 2020-10-08 MED ORDER — AMLODIPINE BESYLATE 5 MG PO TABS
5.0000 mg | ORAL_TABLET | Freq: Every day | ORAL | Status: DC
  Administered 2020-10-09: 5 mg via ORAL
  Filled 2020-10-08: qty 1

## 2020-10-08 MED ORDER — HYDROCHLOROTHIAZIDE 25 MG PO TABS
25.0000 mg | ORAL_TABLET | Freq: Every day | ORAL | Status: DC
  Administered 2020-10-09: 25 mg via ORAL
  Filled 2020-10-08: qty 1

## 2020-10-08 MED ORDER — SODIUM CHLORIDE (PF) 0.9 % IJ SOLN
INTRAMUSCULAR | Status: AC
  Filled 2020-10-08: qty 10

## 2020-10-08 MED ORDER — ACETAMINOPHEN 325 MG PO TABS: 325.0000 mg | ORAL_TABLET | Freq: Four times a day (QID) | ORAL | Status: DC | PRN

## 2020-10-08 MED ORDER — PROPOFOL 500 MG/50ML IV EMUL
INTRAVENOUS | Status: DC | PRN
  Administered 2020-10-08: 75 ug/kg/min via INTRAVENOUS

## 2020-10-08 MED ORDER — MIDAZOLAM HCL 2 MG/2ML IJ SOLN
1.0000 mg | INTRAMUSCULAR | Status: DC
  Filled 2020-10-08: qty 2

## 2020-10-08 MED ORDER — HYDROMORPHONE HCL 1 MG/ML IJ SOLN: 0.2500 mg | INTRAMUSCULAR | Status: DC | PRN

## 2020-10-08 MED ORDER — TRAZODONE HCL 50 MG PO TABS
50.0000 mg | ORAL_TABLET | Freq: Every day | ORAL | Status: DC
  Administered 2020-10-08: 50 mg via ORAL
  Filled 2020-10-08: qty 1

## 2020-10-08 MED ORDER — PHENYLEPHRINE 40 MCG/ML (10ML) SYRINGE FOR IV PUSH (FOR BLOOD PRESSURE SUPPORT)
PREFILLED_SYRINGE | INTRAVENOUS | Status: DC | PRN
  Administered 2020-10-08: 40 ug via INTRAVENOUS
  Administered 2020-10-08: 120 ug via INTRAVENOUS
  Administered 2020-10-08 (×2): 80 ug via INTRAVENOUS
  Administered 2020-10-08 (×2): 40 ug via INTRAVENOUS

## 2020-10-08 MED ORDER — ANASTROZOLE 1 MG PO TABS
1.0000 mg | ORAL_TABLET | Freq: Every day | ORAL | Status: DC
  Administered 2020-10-09: 1 mg via ORAL
  Filled 2020-10-08: qty 1

## 2020-10-08 MED ORDER — SODIUM CHLORIDE (PF) 0.9 % IJ SOLN
INTRAMUSCULAR | Status: DC | PRN
  Administered 2020-10-08: 60 mL

## 2020-10-08 MED ORDER — ALBUTEROL SULFATE HFA 108 (90 BASE) MCG/ACT IN AERS: 2.0000 | INHALATION_SPRAY | Freq: Four times a day (QID) | RESPIRATORY_TRACT | Status: DC | PRN

## 2020-10-08 MED ORDER — STERILE WATER FOR IRRIGATION IR SOLN
Status: DC | PRN
  Administered 2020-10-08: 2000 mL

## 2020-10-08 MED ORDER — PROPOFOL 500 MG/50ML IV EMUL
INTRAVENOUS | Status: AC
  Filled 2020-10-08: qty 50

## 2020-10-08 MED ORDER — OXYCODONE HCL 5 MG PO TABS
5.0000 mg | ORAL_TABLET | ORAL | Status: DC | PRN
  Administered 2020-10-08 – 2020-10-09 (×2): 10 mg via ORAL
  Filled 2020-10-08 (×2): qty 2

## 2020-10-08 MED ORDER — RIVAROXABAN 10 MG PO TABS
10.0000 mg | ORAL_TABLET | Freq: Every day | ORAL | Status: DC
  Administered 2020-10-09: 10 mg via ORAL
  Filled 2020-10-08: qty 1

## 2020-10-08 MED ORDER — PHENOL 1.4 % MT LIQD: 1.0000 | OROMUCOSAL | Status: DC | PRN

## 2020-10-08 MED ORDER — CHLORHEXIDINE GLUCONATE 0.12 % MT SOLN
15.0000 mL | Freq: Once | OROMUCOSAL | Status: AC
  Administered 2020-10-08: 15 mL via OROMUCOSAL

## 2020-10-08 MED ORDER — DOCUSATE SODIUM 100 MG PO CAPS
100.0000 mg | ORAL_CAPSULE | Freq: Two times a day (BID) | ORAL | Status: DC
  Administered 2020-10-08 – 2020-10-09 (×2): 100 mg via ORAL
  Filled 2020-10-08 (×2): qty 1

## 2020-10-08 MED ORDER — METHOCARBAMOL 500 MG IVPB - SIMPLE MED
500.0000 mg | Freq: Four times a day (QID) | INTRAVENOUS | Status: DC | PRN
  Filled 2020-10-08: qty 50

## 2020-10-08 MED ORDER — ORAL CARE MOUTH RINSE: 15.0000 mL | Freq: Once | OROMUCOSAL | Status: AC

## 2020-10-08 MED ORDER — LIDOCAINE 2% (20 MG/ML) 5 ML SYRINGE
INTRAMUSCULAR | Status: AC
  Filled 2020-10-08: qty 5

## 2020-10-08 MED ORDER — BUPIVACAINE LIPOSOME 1.3 % IJ SUSP
INTRAMUSCULAR | Status: DC | PRN
  Administered 2020-10-08: 20 mL

## 2020-10-08 SURGICAL SUPPLY — 55 items
ATTUNE PS FEM RT SZ 5 CEM KNEE (Femur) ×3 IMPLANT
BAG ZIPLOCK 12X15 (MISCELLANEOUS) ×3 IMPLANT
BASEPLATE TIBIAL ROTATING SZ 4 (Knees) ×3 IMPLANT
BLADE SAG 18X100X1.27 (BLADE) ×3 IMPLANT
BLADE SAW SGTL 11.0X1.19X90.0M (BLADE) ×3 IMPLANT
BLADE SURG SZ10 CARB STEEL (BLADE) ×6 IMPLANT
BNDG ELASTIC 6X5.8 VLCR STR LF (GAUZE/BANDAGES/DRESSINGS) ×3 IMPLANT
BOWL SMART MIX CTS (DISPOSABLE) ×3 IMPLANT
CEMENT HV SMART SET (Cement) ×6 IMPLANT
CLOSURE WOUND 1/2 X4 (GAUZE/BANDAGES/DRESSINGS) ×2
COVER SURGICAL LIGHT HANDLE (MISCELLANEOUS) ×3 IMPLANT
COVER WAND RF STERILE (DRAPES) ×3 IMPLANT
CUFF TOURN SGL QUICK 34 (TOURNIQUET CUFF) ×3
CUFF TRNQT CYL 34X4.125X (TOURNIQUET CUFF) ×1 IMPLANT
DECANTER SPIKE VIAL GLASS SM (MISCELLANEOUS) ×3 IMPLANT
DRAPE U-SHAPE 47X51 STRL (DRAPES) ×3 IMPLANT
DRESSING AQUACEL AG SP 3.5X10 (GAUZE/BANDAGES/DRESSINGS) ×1 IMPLANT
DRSG AQUACEL AG ADV 3.5X10 (GAUZE/BANDAGES/DRESSINGS) ×3 IMPLANT
DRSG AQUACEL AG SP 3.5X10 (GAUZE/BANDAGES/DRESSINGS) ×3
DURAPREP 26ML APPLICATOR (WOUND CARE) ×3 IMPLANT
ELECT REM PT RETURN 15FT ADLT (MISCELLANEOUS) ×3 IMPLANT
GLOVE BIO SURGEON STRL SZ7 (GLOVE) ×3 IMPLANT
GLOVE BIO SURGEON STRL SZ8 (GLOVE) ×3 IMPLANT
GLOVE BIOGEL PI IND STRL 7.0 (GLOVE) ×1 IMPLANT
GLOVE BIOGEL PI IND STRL 8 (GLOVE) ×1 IMPLANT
GLOVE BIOGEL PI INDICATOR 7.0 (GLOVE) ×2
GLOVE BIOGEL PI INDICATOR 8 (GLOVE) ×2
GOWN STRL REUS W/TWL LRG LVL3 (GOWN DISPOSABLE) ×6 IMPLANT
HANDPIECE INTERPULSE COAX TIP (DISPOSABLE) ×3
HOLDER FOLEY CATH W/STRAP (MISCELLANEOUS) ×3 IMPLANT
IMMOBILIZER KNEE 20 (SOFTGOODS) ×3
IMMOBILIZER KNEE 20 THIGH 36 (SOFTGOODS) ×1 IMPLANT
INSERT TIBIAL KNEE SZ 5 12MM (Insert) ×1 IMPLANT
KIT TURNOVER KIT A (KITS) IMPLANT
MANIFOLD NEPTUNE II (INSTRUMENTS) ×3 IMPLANT
NS IRRIG 1000ML POUR BTL (IV SOLUTION) ×3 IMPLANT
PACK TOTAL KNEE CUSTOM (KITS) ×3 IMPLANT
PADDING CAST COTTON 6X4 STRL (CAST SUPPLIES) ×3 IMPLANT
PATELLA MEDIAL ATTUN 35MM KNEE (Knees) ×3 IMPLANT
PENCIL SMOKE EVACUATOR (MISCELLANEOUS) ×3 IMPLANT
PIN DRILL FIX HALF THREAD (BIT) ×3 IMPLANT
PIN STEINMAN FIXATION KNEE (PIN) ×3 IMPLANT
PROTECTOR NERVE ULNAR (MISCELLANEOUS) ×3 IMPLANT
SET HNDPC FAN SPRY TIP SCT (DISPOSABLE) ×1 IMPLANT
STRIP CLOSURE SKIN 1/2X4 (GAUZE/BANDAGES/DRESSINGS) ×4 IMPLANT
SUT MNCRL AB 4-0 PS2 18 (SUTURE) ×3 IMPLANT
SUT STRATAFIX 0 PDS 27 VIOLET (SUTURE) ×3
SUT VIC AB 2-0 CT1 27 (SUTURE) ×9
SUT VIC AB 2-0 CT1 TAPERPNT 27 (SUTURE) ×3 IMPLANT
SUTURE STRATFX 0 PDS 27 VIOLET (SUTURE) ×1 IMPLANT
SYR BULB IRRIG 60ML STRL (SYRINGE) ×3 IMPLANT
TIBIAL INSERT KNEE SZ 5 12MM (Insert) ×3 IMPLANT
TRAY FOLEY MTR SLVR 16FR STAT (SET/KITS/TRAYS/PACK) ×3 IMPLANT
WATER STERILE IRR 1000ML POUR (IV SOLUTION) ×6 IMPLANT
WRAP KNEE MAXI GEL POST OP (GAUZE/BANDAGES/DRESSINGS) ×3 IMPLANT

## 2020-10-08 NOTE — Care Plan (Signed)
Ortho Bundle Case Management Note  Patient Details  Name: Renee Maldonado MRN: [id number] Date of Birth: 1946/08/01                  R TKA on 10/08/20. DCP: Home with husband. 2 story home with 3 steps. MBR on main. DME: No needs. Has RW & 3in1. PT: [facility] 11/29   DME Arranged:  N/A DME Agency:     HH Arranged:    [place] Agency:     Additional Comments: Please contact me with any questions of if this plan should need to change.  Marianne Sofia, RN,CCM [facility]  [phone] 10/08/2020, 1:12 PM

## 2020-10-08 NOTE — Anesthesia Postprocedure Evaluation (Signed)
Anesthesia Post Note  [name]  Procedure(s) Performed: TOTAL KNEE ARTHROPLASTY (Right Knee)     Patient location during evaluation: PACU Anesthesia Type: Spinal Level of consciousness: oriented and awake and alert Pain management: pain level controlled Vital Signs Assessment: post-procedure vital signs reviewed and stable Respiratory status: spontaneous breathing, respiratory function stable and patient connected to nasal cannula oxygen Cardiovascular status: blood pressure returned to baseline and stable Postop Assessment: no headache, no backache and no apparent nausea or vomiting Anesthetic complications: no   No complications documented.  Last Vitals:  Vitals:   10/08/20 1650 10/08/20 1750  BP: (!) 148/78 (!) 153/72  Pulse: 76 75  Resp:  18  Temp: (!) 36.4 C   SpO2: 100% 100%    Last Pain:  Vitals:   10/08/20 1650  TempSrc: Oral  PainSc:                  Effie Berkshire

## 2020-10-08 NOTE — Discharge Instructions (Addendum)
Gaynelle Arabian, MD Total Joint Specialist [facility] [address] [place], [place] [address] [phone]  TOTAL KNEE REPLACEMENT POSTOPERATIVE DIRECTIONS    Knee Rehabilitation, Guidelines Following Surgery  Results after knee surgery are often greatly improved when you follow the exercise, range of motion and muscle strengthening exercises prescribed by your doctor. Safety measures are also important to protect the knee from further injury. If any of these exercises cause you to have increased pain or swelling in your knee joint, decrease the amount until you are comfortable again and slowly increase them. If you have problems or questions, call your caregiver or physical therapist for advice.   BLOOD CLOT PREVENTION . Take a 10 mg Xarelto once a day for three weeks following surgery. Then take an 81 mg Aspirin once a day for three weeks. Then discontinue Aspirin. . You may resume your vitamins/supplements once you have discontinued the Xarelto. . Do not take any NSAIDs (Advil, Aleve, Ibuprofen, Meloxicam, etc.) until you have discontinued the Xarelto.   HOME CARE INSTRUCTIONS  . Remove items at home which could result in a fall. This includes throw rugs or furniture in walking pathways.  . ICE to the affected knee as much as tolerated. Icing helps control swelling. If the swelling is well controlled you will be more comfortable and rehab easier. Continue to use ice on the knee for pain and swelling from surgery. You may notice swelling that will progress down to the foot and ankle. This is normal after surgery. Elevate the leg when you are not up walking on it.    . Continue to use the breathing machine which will help keep your temperature down. It is common for your temperature to cycle up and down following surgery, especially at night when you are not up moving around and exerting yourself. The breathing machine keeps your lungs expanded and your  temperature down. . Do not place pillow under the operative knee, focus on keeping the knee straight while resting  DIET You may resume your previous home diet once you are discharged from the hospital.  DRESSING / [place] / SHOWERING . Keep your bulky bandage on for 2 days. On the third post-operative day you may remove the Ace bandage and gauze. There is a waterproof adhesive bandage on your skin which will stay in place until your first follow-up appointment. Once you remove this you will not need to place another bandage . You may begin showering 3 days following surgery, but do not submerge the incision under water.  ACTIVITY For the first 5 days, the key is rest and control of pain and swelling . Do your home exercises twice a day starting on post-operative day 3. On the days you go to physical therapy, just do the home exercises once that day. . You should rest, ice and elevate the leg for 50 minutes out of every hour. Get up and walk/stretch for 10 minutes per hour. After 5 days you can increase your activity slowly as tolerated. . Walk with your walker as instructed. Use the walker until you are comfortable transitioning to a cane. Walk with the cane in the opposite hand of the operative leg. You may discontinue the cane once you are comfortable and walking steadily. . Avoid periods of inactivity such as sitting longer than an hour when not asleep. This helps prevent blood clots.  . You may discontinue the knee immobilizer once you are able to perform a straight leg  raise while lying down. . You may resume a sexual relationship in one month or when given the OK by your doctor.  . You may return to work once you are cleared by your doctor.  . Do not drive a car for 6 weeks or until released by your surgeon.  . Do not drive while taking narcotics.  TED HOSE STOCKINGS Wear the elastic stockings on both legs for three weeks following surgery during the day. You may remove them at night  for sleeping.  WEIGHT BEARING Weight bearing as tolerated with assist device (walker, cane, etc) as directed, use it as long as suggested by your surgeon or therapist, typically at least 4-6 weeks.  POSTOPERATIVE CONSTIPATION PROTOCOL Constipation - defined medically as fewer than three stools per week and severe constipation as less than one stool per week.  One of the most common issues patients have following surgery is constipation.  Even if you have a regular bowel pattern at home, your normal regimen is likely to be disrupted due to multiple reasons following surgery.  Combination of anesthesia, postoperative narcotics, change in appetite and fluid intake all can affect your bowels.  In order to avoid complications following surgery, here are some recommendations in order to help you during your recovery period.  . Colace (docusate) - Pick up an over-the-counter form of Colace or another stool softener and take twice a day as long as you are requiring postoperative pain medications.  Take with a full glass of water daily.  If you experience loose stools or diarrhea, hold the colace until you stool forms back up. If your symptoms do not get better within 1 week or if they get worse, check with your doctor. . Dulcolax (bisacodyl) - Pick up over-the-counter and take as directed by the product packaging as needed to assist with the movement of your bowels.  Take with a full glass of water.  Use this product as needed if not relieved by Colace only.  . MiraLax (polyethylene glycol) - Pick up over-the-counter to have on hand. MiraLax is a solution that will increase the amount of water in your bowels to assist with bowel movements.  Take as directed and can mix with a glass of water, juice, soda, coffee, or tea. Take if you go more than two days without a movement. Do not use MiraLax more than once per day. Call your doctor if you are still constipated or irregular after using this medication for 7 days  in a row.  If you continue to have problems with postoperative constipation, please contact the office for further assistance and recommendations.  If you experience "the worst abdominal pain ever" or develop nausea or vomiting, please contact the office immediatly for further recommendations for treatment.  ITCHING If you experience itching with your medications, try taking only a single pain pill, or even half a pain pill at a time.  You can also use Benadryl over the counter for itching or also to help with sleep.   MEDICATIONS See your medication summary on the "After Visit Summary" that the nursing staff will review with you prior to discharge.  You may have some home medications which will be placed on hold until you complete the course of blood thinner medication.  It is important for you to complete the blood thinner medication as prescribed by your surgeon.  Continue your approved medications as instructed at time of discharge.  PRECAUTIONS . If you experience chest pain or shortness of breath -  call 911 immediately for transfer to the hospital emergency department.  . If you develop a fever greater that 101 F, purulent drainage from wound, increased redness or drainage from wound, foul odor from the wound/dressing, or calf pain - CONTACT YOUR SURGEON.                                                   FOLLOW-UP APPOINTMENTS Make sure you keep all of your appointments after your operation with your surgeon and caregivers. You should call the office at the above phone number and make an appointment for approximately two weeks after the date of your surgery or on the date instructed by your surgeon outlined in the "After Visit Summary".  RANGE OF MOTION AND STRENGTHENING EXERCISES  Rehabilitation of the knee is important following a knee injury or an operation. After just a few days of immobilization, the muscles of the thigh which control the knee become weakened and shrink (atrophy). Knee  exercises are designed to build up the tone and strength of the thigh muscles and to improve knee motion. Often times heat used for twenty to thirty minutes before working out will loosen up your tissues and help with improving the range of motion but do not use heat for the first two weeks following surgery. These exercises can be done on a training (exercise) mat, on the floor, on a table or on a bed. Use what ever works the best and is most comfortable for you Knee exercises include:  . Leg Lifts - While your knee is still immobilized in a splint or cast, you can do straight leg raises. Lift the leg to 60 degrees, hold for 3 sec, and slowly lower the leg. Repeat 10-20 times 2-3 times daily. Perform this exercise against resistance later as your knee gets better.  Javier Docker and Hamstring Sets - Tighten up the muscle on the front of the thigh (Quad) and hold for 5-10 sec. Repeat this 10-20 times hourly. Hamstring sets are done by pushing the foot backward against an object and holding for 5-10 sec. Repeat as with quad sets.   Leg Slides: Lying on your back, slowly slide your foot toward your buttocks, bending your knee up off the floor (only go as far as is comfortable). Then slowly slide your foot back down until your leg is flat on the floor again.  Angel Wings: Lying on your back spread your legs to the side as far apart as you can without causing discomfort.  A rehabilitation program following serious knee injuries can speed recovery and prevent re-injury in the future due to weakened muscles. Contact your doctor or a physical therapist for more information on knee rehabilitation.   IF YOU ARE TRANSFERRED TO A SKILLED REHAB FACILITY If the patient is transferred to a skilled rehab facility following release from the hospital, a list of the current medications will be sent to the facility for the patient to continue.  When discharged from the skilled rehab facility, please have the facility set up the  patient's [place] prior to being released. Also, the skilled facility will be responsible for providing the patient with their medications at time of release from the facility to include their pain medication, the muscle relaxants, and their blood thinner medication. If the patient is still at the rehab facility  at time of the two week follow up appointment, the skilled rehab facility will also need to assist the patient in arranging follow up appointment in our office and any transportation needs.  MAKE SURE YOU:  . Understand these instructions.  . Get help right away if you are not doing well or get worse.   DENTAL ANTIBIOTICS:  In most cases prophylactic antibiotics for Dental procdeures after total joint surgery are not necessary.  Exceptions are as follows:  1. History of prior total joint infection  2. Severely immunocompromised (Organ Transplant, cancer chemotherapy, Rheumatoid biologic meds such as [place])  3. Poorly controlled diabetes (A1C &gt; 8.0, blood glucose over 200)  If you have one of these conditions, contact your surgeon for an antibiotic prescription, prior to your dental procedure.    Pick up stool softner and laxative for home use following surgery while on pain medications. Do not submerge incision under water. Please use good hand washing techniques while changing dressing each day. May shower starting three days after surgery. Please use a clean towel to pat the incision dry following showers. Continue to use ice for pain and swelling after surgery. Do not use any lotions or creams on the incision until instructed by your surgeon.    Information on my medicine - XARELTO (Rivaroxaban)   Why was Xarelto prescribed for you? Xarelto was prescribed for you to reduce the risk of blood clots forming after orthopedic surgery. The medical term for these abnormal blood clots is venous thromboembolism (VTE).  What do you need to know  about xarelto ? Take your Xarelto ONCE DAILY at the same time every day. You may take it either with or without food.  If you have difficulty swallowing the tablet whole, you may crush it and mix in applesauce just prior to taking your dose.  Take Xarelto exactly as prescribed by your doctor and DO NOT stop taking Xarelto without talking to the doctor who prescribed the medication.  Stopping without other VTE prevention medication to take the place of Xarelto may increase your risk of developing a clot.  After discharge, you should have regular check-up appointments with your healthcare provider that is prescribing your Xarelto.    What do you do if you miss a dose? If you miss a dose, take it as soon as you remember on the same day then continue your regularly scheduled once daily regimen the next day. Do not take two doses of Xarelto on the same day.   Important Safety Information A possible side effect of Xarelto is bleeding. You should call your healthcare provider right away if you experience any of the following: ? Bleeding from an injury or your nose that does not stop. ? Unusual colored urine (red or dark brown) or unusual colored stools (red or black). ? Unusual bruising for unknown reasons. ? A serious fall or if you hit your head (even if there is no bleeding).  Some medicines may interact with Xarelto and might increase your risk of bleeding while on Xarelto. To help avoid this, consult your healthcare provider or pharmacist prior to using any new prescription or non-prescription medications, including herbals, vitamins, non-steroidal anti-inflammatory drugs (NSAIDs) and supplements.  This website has more information on Xarelto: [email or web address]

## 2020-10-08 NOTE — Anesthesia Procedure Notes (Signed)
Spinal  Patient location during procedure: OR Start time: 10/08/2020 2:19 PM End time: 10/08/2020 2:22 PM Staffing Performed: resident/CRNA  Anesthesiologist: Effie Berkshire, MD Resident/CRNA: Raenette Rover, CRNA Preanesthetic Checklist Completed: patient identified, IV checked, site marked, risks and benefits discussed, surgical consent, monitors and equipment checked, pre-op evaluation and timeout performed Spinal Block Patient position: sitting Prep: DuraPrep Patient monitoring: blood pressure, continuous pulse ox and heart rate Approach: midline Location: L3-4 Injection technique: single-shot Needle Needle type: Pencan  Needle gauge: 24 G Assessment Sensory level: T6

## 2020-10-08 NOTE — Anesthesia Procedure Notes (Signed)
Anesthesia Regional Block: Adductor canal block   Pre-Anesthetic Checklist: ,, timeout performed, Correct Patient, Correct Site, Correct Laterality, Correct Procedure, Correct Position, site marked, Risks and benefits discussed,  Surgical consent,  Pre-op evaluation,  At surgeon's request and post-op pain management  Laterality: Right  Prep: chloraprep       Needles:  Injection technique: Single-shot  Needle Type: Echogenic Stimulator Needle     Needle Length: 9cm  Needle Gauge: 21     Additional Needles:   Procedures:,,,, ultrasound used (permanent image in chart),,,,  Narrative:  Start time: 10/08/2020 12:15 PM End time: 10/08/2020 12:20 PM Injection made incrementally with aspirations every 5 mL.  Performed by: Personally  Anesthesiologist: Effie Berkshire, MD  Additional Notes: Patient tolerated the procedure well. Local anesthetic introduced in an incremental fashion under minimal resistance after negative aspirations. No paresthesias were elicited. After completion of the procedure, no acute issues were identified and patient continued to be monitored by RN.

## 2020-10-08 NOTE — Progress Notes (Signed)
Orthopedic Tech Progress Note Patient Details:  Renee Maldonado March 16, 1946 [id number]  CPM Right Knee CPM Right Knee: On Right Knee Flexion (Degrees): 40 Right Knee Extension (Degrees): 10  Post Interventions Patient Tolerated: Well Instructions Provided: Care of device  Renee Maldonado 10/08/2020, 4:02 PM

## 2020-10-08 NOTE — Interval H&P Note (Signed)
History and Physical Interval Note:  10/08/2020 12:15 PM  Renee Maldonado  has presented today for surgery, with the diagnosis of right knee osteoarthritis.  The various methods of treatment have been discussed with the patient and family. After consideration of risks, benefits and other options for treatment, the patient has consented to  Procedure(s): TOTAL KNEE ARTHROPLASTY (Right) as a surgical intervention.  The patient's history has been reviewed, patient examined, no change in status, stable for surgery.  I have reviewed the patient's chart and labs.  Questions were answered to the patient's satisfaction.     Pilar Plate Arlyce Circle

## 2020-10-08 NOTE — Transfer of Care (Signed)
Immediate Anesthesia Transfer of Care Note  Patient: Tresa Knab  Procedure(s) Performed: TOTAL KNEE ARTHROPLASTY (Right Knee)  Patient Location: PACU  Anesthesia Type:Spinal  Level of Consciousness: awake, alert , oriented and patient cooperative  Airway & Oxygen Therapy: Patient Spontanous Breathing and Patient connected to face mask oxygen  Post-op Assessment: Report given to RN and Post -op Vital signs reviewed and stable  Post vital signs: Reviewed and stable  Last Vitals:  Vitals Value Taken Time  BP 124/67 10/08/20 1553  Temp 36.3 C 10/08/20 1553  Pulse 79 10/08/20 1553  Resp 15 10/08/20 1553  SpO2 100 % 10/08/20 1553  Vitals shown include unvalidated device data.  Last Pain:  Vitals:   10/08/20 1141  TempSrc:   PainSc: 2       Patients Stated Pain Goal: 3 (16/10/96 0454)  Complications: No complications documented.

## 2020-10-08 NOTE — Progress Notes (Signed)
Assisted Dr. Hollis with right, ultrasound guided, adductor canal block. Side rails up, monitors on throughout procedure. See vital signs in flow sheet. Tolerated Procedure well.  

## 2020-10-08 NOTE — Anesthesia Preprocedure Evaluation (Addendum)
Anesthesia Evaluation  Patient identified by MRN, date of birth, ID band Patient awake    Reviewed: Allergy & Precautions, NPO status , Patient's Chart, lab work & pertinent test results  Airway Mallampati: II  TM Distance: >3 FB Neck ROM: Full    Dental  (+) Teeth Intact, Dental Advisory Given   Pulmonary COPD,  COPD inhaler, Current Smoker,     + decreased breath sounds      Cardiovascular hypertension, Pt. on medications  Rhythm:Regular Rate:Normal     Neuro/Psych PSYCHIATRIC DISORDERS Anxiety negative neurological ROS     GI/Hepatic Neg liver ROS, GERD  Medicated,  Endo/Other  negative endocrine ROS  Renal/GU Renal InsufficiencyRenal disease     Musculoskeletal  (+) Arthritis ,   Abdominal (+) + obese,   Peds  Hematology negative hematology ROS (+)   Anesthesia Other Findings - HLD  PE: - dry cough  Reproductive/Obstetrics                            Anesthesia Physical Anesthesia Plan  ASA: III  Anesthesia Plan: Spinal   Post-op Pain Management:  Regional for Post-op pain   Induction: Intravenous  PONV Risk Score and Plan: 2 and Ondansetron and Propofol infusion  Airway Management Planned: Natural Airway and Simple Face Mask  Additional Equipment: None  Intra-op Plan:   Post-operative Plan:   Informed Consent: I have reviewed the patients History and Physical, chart, labs and discussed the procedure including the risks, benefits and alternatives for the proposed anesthesia with the patient or authorized representative who has indicated his/her understanding and acceptance.     Dental advisory given  Plan Discussed with: CRNA  Anesthesia Plan Comments: (Lab Results      Component                Value               Date                      WBC                      5.4                 10/02/2020                HGB                      11.3 (L)            10/02/2020                 HCT                      35.6 (L)            10/02/2020                MCV                      86.6                10/02/2020                PLT                      312  10/02/2020           )       Anesthesia Quick Evaluation

## 2020-10-08 NOTE — Op Note (Signed)
OPERATIVE REPORT-TOTAL KNEE ARTHROPLASTY   Pre-operative diagnosis- Osteoarthritis  Right knee(s)  Post-operative diagnosis- Osteoarthritis Right knee(s)  Procedure-  Right  Total Knee Arthroplasty  Surgeon- Dione Plover. Jya Hughston, MD  Assistant- Molli Barrows, PA_C   Anesthesia-  Adductor canal block and spinal  EBL- 25 ml   Drains None  Tourniquet time- 33 minutes @ 154 mm Hg  Complications- None  Condition-PACU - hemodynamically stable.   Brief Clinical Note  Renee Maldonado is a 74 y.o. year old female with end stage OA of her right knee with progressively worsening pain and dysfunction. She has constant pain, with activity and at rest and significant functional deficits with difficulties even with ADLs. She has had extensive non-op management including analgesics, injections of cortisone and viscosupplements, and home exercise program, but remains in significant pain with significant dysfunction.Radiographs show bone on bone arthritis medial and patellofemoral. She presents now for right Total Knee Arthroplasty.    Procedure in detail---   The patient is brought into the operating room and positioned supine on the operating table. After successful administration of  Adductor canal block and spinal,   a tourniquet is placed high on the  Right thigh(s) and the lower extremity is prepped and draped in the usual sterile fashion. Time out is performed by the operating team and then the  Right lower extremity is wrapped in Esmarch, knee flexed and the tourniquet inflated to 300 mmHg.       A midline incision is made with a ten blade through the subcutaneous tissue to the level of the extensor mechanism. A fresh blade is used to make a medial parapatellar arthrotomy. Soft tissue over the proximal medial tibia is subperiosteally elevated to the joint line with a knife and into the semimembranosus bursa with a Cobb elevator. Soft tissue over the proximal lateral tibia is elevated with attention  being paid to avoiding the patellar tendon on the tibial tubercle. The patella is everted, knee flexed 90 degrees and the ACL and PCL are removed. Findings are bone on bone medial and patellofemoral with large global osteophytes.        The drill is used to create a starting hole in the distal femur and the canal is thoroughly irrigated with sterile saline to remove the fatty contents. The 5 degree Right  valgus alignment guide is placed into the femoral canal and the distal femoral cutting block is pinned to remove 9 mm off the distal femur. Resection is made with an oscillating saw.      The tibia is subluxed forward and the menisci are removed. The extramedullary alignment guide is placed referencing proximally at the medial aspect of the tibial tubercle and distally along the second metatarsal axis and tibial crest. The block is pinned to remove 56mm off the more deficient medial  side. Resection is made with an oscillating saw. Size 4is the most appropriate size for the tibia and the proximal tibia is prepared with the modular drill and keel punch for that size.      The femoral sizing guide is placed and size 5 is most appropriate. Rotation is marked off the epicondylar axis and confirmed by creating a rectangular flexion gap at 90 degrees. The size 5 cutting block is pinned in this rotation and the anterior, posterior and chamfer cuts are made with the oscillating saw. The intercondylar block is then placed and that cut is made.      Trial size 4 tibial component, trial size 5 posterior stabilized  femur and a 12  mm posterior stabilized rotating platform insert trial is placed. Full extension is achieved with excellent varus/valgus and anterior/posterior balance throughout full range of motion. The patella is everted and thickness measured to be 22  mm. Free hand resection is taken to 12 mm, a 35 template is placed, lug holes are drilled, trial patella is placed, and it tracks normally. Osteophytes are  removed off the posterior femur with the trial in place. All trials are removed and the cut bone surfaces prepared with pulsatile lavage. Cement is mixed and once ready for implantation, the size 4 tibial implant, size  5 posterior stabilized femoral component, and the size 35 patella are cemented in place and the patella is held with the clamp. The trial insert is placed and the knee held in full extension. The Exparel (20 ml mixed with 60 ml saline) is injected into the extensor mechanism, posterior capsule, medial and lateral gutters and subcutaneous tissues.  All extruded cement is removed and once the cement is hard the permanent 12 mm posterior stabilized rotating platform insert is placed into the tibial tray.      The wound is copiously irrigated with saline solution and the extensor mechanism closed with # 0 Stratofix suture. The tourniquet is released for a total tourniquet time of 33  minutes. Flexion against gravity is 140 degrees and the patella tracks normally. Subcutaneous tissue is closed with 2.0 vicryl and subcuticular with running 4.0 Monocryl. The incision is cleaned and dried and steri-strips and a bulky sterile dressing are applied. The limb is placed into a knee immobilizer and the patient is awakened and transported to recovery in stable condition.      Please note that a surgical assistant was a medical necessity for this procedure in order to perform it in a safe and expeditious manner. Surgical assistant was necessary to retract the ligaments and vital neurovascular structures to prevent injury to them and also necessary for proper positioning of the limb to allow for anatomic placement of the prosthesis.   Dione Plover Savas Elvin, MD    10/08/2020, 3:21 PM

## 2020-10-08 NOTE — Evaluation (Signed)
Physical Therapy Evaluation Patient Details Name: Renee Maldonado MRN: [id number] DOB: 08-23-1946 Today's Date: 10/08/2020   History of Present Illness  Patient is 74 y.o. female s/p Rt TKA on 10/08/20 with PMH significant for HTN, HLD, GERD, COPD, breast cancer, OA, anxiety, Lt TKA in 2012.    Clinical Impression  Renee Maldonado is a 74 y.o. female POD 0 s/p Rt TKA. Patient reports independence with mobility at baseline. Patient is now limited by functional impairments (see PT problem list below) and requires min assist for transfers and gait with RW. Patient was able to ambulate ~50 feet with RW and min assist. Patient instructed in exercise to facilitate circulation. Patient will benefit from continued skilled PT interventions to address impairments and progress towards PLOF. Acute PT will follow to progress mobility and stair training in preparation for safe discharge home.     Follow Up Recommendations Follow surgeon's recommendation for DC plan and follow-up therapies    Equipment Recommendations  None recommended by PT    Recommendations for Other Services       Precautions / Restrictions Precautions Precautions: Fall Restrictions Weight Bearing Restrictions: No Other Position/Activity Restrictions: WBAT      Mobility  Bed Mobility Overal bed mobility: Needs Assistance Bed Mobility: Supine to Sit     Supine to sit: Min guard;HOB elevated     General bed mobility comments: cues to use bed rail, pt taking extra time.     Transfers Overall transfer level: Needs assistance Equipment used: Rolling walker (2 wheeled) Transfers: Sit to/from Stand Sit to Stand: Min guard;Min assist         General transfer comment: cues for technique with RW, assist for power up and to steady with completing rise.  Ambulation/Gait Ambulation/Gait assistance: Min assist Gait Distance (Feet): 50 Feet Assistive device: Rolling walker (2 wheeled) Gait Pattern/deviations: Step-to  pattern;Decreased stride length;Decreased weight shift to right Gait velocity: decr   General Gait Details: cues for proximity to RW and step pattern. no buckling at Rt knee and no LOB noted.  Stairs            Wheelchair Mobility    Modified Rankin (Stroke Patients Only)       Balance Overall balance assessment: Needs assistance Sitting-balance support: Feet supported Sitting balance-Leahy Scale: Good     Standing balance support: During functional activity;Bilateral upper extremity supported Standing balance-Leahy Scale: Fair                               Pertinent Vitals/Pain Pain Assessment: 0-10 Pain Score: 5  Pain Location: Rt knee Pain Descriptors / Indicators: Aching;Discomfort;Dull;Sore Pain Intervention(s): Limited activity within patient's tolerance;Monitored during session;Repositioned;Premedicated before session;Ice applied    Home Living Family/patient expects to be discharged to:: Private residence Living Arrangements: Spouse/significant other Available Help at Discharge: Family Type of Home: House Home Access: Stairs to enter Entrance Stairs-Rails: None Entrance Stairs-Number of Steps: 2+1 Home Layout: Two level;Laundry or work area in basement;Full bath on main level;Able to live on main level with bedroom/bathroom Home Equipment: Gilford Rile - 2 wheels;Bedside commode;Grab bars - tub/shower;Cane - single point      Prior Function Level of Independence: Independent               Hand Dominance   Dominant Hand: Right    Extremity/Trunk Assessment   Upper Extremity Assessment Upper Extremity Assessment: Overall WFL for tasks assessed    Lower Extremity Assessment Lower Extremity  Assessment: RLE deficits/detail RLE Deficits / Details: good quad activation, no extensor lag with RW RLE Sensation: WNL RLE Coordination: WNL    Cervical / Trunk Assessment Cervical / Trunk Assessment: Normal  Communication   Communication:  No difficulties  Cognition Arousal/Alertness: Awake/alert Behavior During Therapy: WFL for tasks assessed/performed Overall Cognitive Status: Within Functional Limits for tasks assessed                                        General Comments      Exercises Total Joint Exercises Ankle Circles/Pumps: AROM;Both;20 reps;Seated   Assessment/Plan    PT Assessment Patient needs continued PT services  PT Problem List Decreased strength;Decreased range of motion;Decreased activity tolerance;Decreased balance;Decreased mobility;Decreased knowledge of use of DME;Decreased knowledge of precautions       PT Treatment Interventions DME instruction;Stair training;Gait training;Functional mobility training;Therapeutic activities;Therapeutic exercise;Balance training;Patient/family education    PT Goals (Current goals can be found in the Care Plan section)  Acute Rehab PT Goals Patient Stated Goal: recover and get independence back PT Goal Formulation: With patient Time For Goal Achievement: 10/15/20 Potential to Achieve Goals: Good    Frequency 7X/week   Barriers to discharge        Co-evaluation               AM-PAC PT "6 Clicks" Mobility  Outcome Measure Help needed turning from your back to your side while in a flat bed without using bedrails?: None Help needed moving from lying on your back to sitting on the side of a flat bed without using bedrails?: A Little Help needed moving to and from a bed to a chair (including a wheelchair)?: A Little Help needed standing up from a chair using your arms (e.g., wheelchair or bedside chair)?: A Little Help needed to walk in hospital room?: A Little Help needed climbing 3-5 steps with a railing? : A Little 6 Click Score: 19    End of Session Equipment Utilized During Treatment: Gait belt Activity Tolerance: Patient tolerated treatment well Patient left: in chair;with call bell/phone within reach;with chair alarm  set Nurse Communication: Mobility status PT Visit Diagnosis: Muscle weakness (generalized) (M62.81);Difficulty in walking, not elsewhere classified (R26.2)    Time: 7564-3329 PT Time Calculation (min) (ACUTE ONLY): 30 min   Charges:   PT Evaluation $PT Eval Low Complexity: 1 Low PT Treatments $Gait Training: 8-22 mins        Verner Mould, DPT Acute Rehabilitation Services  Office [phone] Pager [phone]  10/08/2020 7:04 PM

## 2020-10-09 ENCOUNTER — Encounter (HOSPITAL_COMMUNITY): Payer: Self-pay | Admitting: Orthopedic Surgery

## 2020-10-09 DIAGNOSIS — M1711 Unilateral primary osteoarthritis, right knee: Secondary | ICD-10-CM | POA: Diagnosis not present

## 2020-10-09 LAB — BASIC METABOLIC PANEL
Anion gap: 12 (ref 5–15)
BUN: 19 mg/dL (ref 8–23)
CO2: 26 mmol/L (ref 22–32)
Calcium: 8.4 mg/dL — ABNORMAL LOW (ref 8.9–10.3)
Chloride: 97 mmol/L — ABNORMAL LOW (ref 98–111)
Creatinine, Ser: 1.46 mg/dL — ABNORMAL HIGH (ref 0.44–1.00)
GFR, Estimated: 37 mL/min — ABNORMAL LOW (ref >60)
Glucose, Bld: 158 mg/dL — ABNORMAL HIGH (ref 70–99)
Potassium: 3.3 mmol/L — ABNORMAL LOW (ref 3.5–5.1)
Sodium: 135 mmol/L (ref 135–145)

## 2020-10-09 LAB — CBC
HCT: 30.1 % — ABNORMAL LOW (ref 36.0–46.0)
Hemoglobin: 9.9 g/dL — ABNORMAL LOW (ref 12.0–15.0)
MCH: 27.8 pg (ref 26.0–34.0)
MCHC: 32.9 g/dL (ref 30.0–36.0)
MCV: 84.6 fL (ref 80.0–100.0)
Platelets: 261 10*3/uL (ref 150–400)
RBC: 3.56 MIL/uL — ABNORMAL LOW (ref 3.87–5.11)
RDW: 13.2 % (ref 11.5–15.5)
WBC: 12.4 10*3/uL — ABNORMAL HIGH (ref 4.0–10.5)
nRBC: 0 % (ref 0.0–0.2)

## 2020-10-09 MED ORDER — OXYCODONE HCL 5 MG PO TABS
5.0000 mg | ORAL_TABLET | Freq: Four times a day (QID) | ORAL | 0 refills | Status: DC | PRN
Start: 2020-10-09 — End: 2020-12-16

## 2020-10-09 MED ORDER — TRAMADOL HCL 50 MG PO TABS
50.0000 mg | ORAL_TABLET | Freq: Four times a day (QID) | ORAL | 0 refills | Status: DC | PRN
Start: 2020-10-09 — End: 2020-12-13

## 2020-10-09 MED ORDER — METHOCARBAMOL 500 MG PO TABS
500.0000 mg | ORAL_TABLET | Freq: Four times a day (QID) | ORAL | 0 refills | Status: DC | PRN
Start: 2020-10-09 — End: 2021-09-23

## 2020-10-09 MED ORDER — GABAPENTIN 300 MG PO CAPS
ORAL_CAPSULE | ORAL | 0 refills | Status: DC
Start: 2020-10-09 — End: 2020-12-13

## 2020-10-09 MED ORDER — POTASSIUM CHLORIDE CRYS ER 20 MEQ PO TBCR
40.0000 meq | EXTENDED_RELEASE_TABLET | ORAL | Status: AC
  Administered 2020-10-09 (×2): 40 meq via ORAL
  Filled 2020-10-09 (×2): qty 2

## 2020-10-09 MED ORDER — RIVAROXABAN 10 MG PO TABS: 10.0000 mg | ORAL_TABLET | Freq: Every day | ORAL | 0 refills | Status: DC

## 2020-10-09 NOTE — Progress Notes (Addendum)
   Subjective: 1 Day Post-Op Procedure(s) (LRB): TOTAL KNEE ARTHROPLASTY (Right) Patient reports pain as mild.   Patient seen in rounds by Dr. Wynelle Link. Patient is well, and has had no acute complaints or problems other than soreness in the right knee. Denies chest pain, SOB, or calf pain. No issues overnight. Foley catheter removed this AM. We will continue therapy today, ambulated 68' yesterday.   Objective: Vital signs in last 24 hours: Temp:  [97.4 F (36.3 C)-98.9 F (37.2 C)] 98.2 F (36.8 C) (11/23 0615) Pulse Rate:  [71-93] 93 (11/23 0615) Resp:  [9-19] 17 (11/23 0615) BP: (124-153)/(64-79) 137/65 (11/23 0615) SpO2:  [95 %-100 %] 98 % (11/23 0615) Weight:  [86.2 kg] 86.2 kg (11/22 1141)  Intake/Output from previous day:  Intake/Output Summary (Last 24 hours) at 10/09/2020 0750 Last data filed at 10/09/2020 0615 Gross per 24 hour  Intake 1300 ml  Output 2160 ml  Net -860 ml     Intake/Output this shift: No intake/output data recorded.  Labs: Recent Labs    10/09/20 0338  HGB 9.9*   Recent Labs    10/09/20 0338  WBC 12.4*  RBC 3.56*  HCT 30.1*  PLT 261   Recent Labs    10/09/20 0338  NA 135  K 3.3*  CL 97*  CO2 26  BUN 19  CREATININE 1.46*  GLUCOSE 158*  CALCIUM 8.4*   No results for input(s): LABPT, INR in the last 72 hours.  Exam: General - Patient is Alert and Oriented Extremity - Neurologically intact Neurovascular intact Sensation intact distally Dorsiflexion/Plantar flexion intact Dressing - dressing C/D/I Motor Function - intact, moving foot and toes well on exam.   Past Medical History:  Diagnosis Date  . Anxiety   . Arthritis   . Cancer [facility])    breast Right no chemo or radiation  on Arimidex  . COPD (chronic obstructive pulmonary disease) ([place])    on Breo  . GERD (gastroesophageal reflux disease)   . Humerus fracture 2007  . Hyperlipidemia   . Hypertension   . Insomnia     Assessment/Plan: 1 Day Post-Op Procedure(s)  (LRB): TOTAL KNEE ARTHROPLASTY (Right) Principal Problem:   OA (osteoarthritis) of knee Active Problems:   Primary osteoarthritis of right knee  Estimated body mass index is 34.75 kg/m as calculated from the following:   Height as of this encounter: 5\' 2"  (1.575 m).   Weight as of this encounter: 86.2 kg. Advance diet Up with therapy D/C IV fluids   Patient's anticipated LOS is less than 2 midnights, meeting these requirements: - Lives within 1 hour of care - Has a competent adult at home to recover with post-op recover - NO history of  - Chronic pain requiring opioids  - Diabetes  - Coronary Artery Disease  - Heart failure  - Heart attack  - Stroke  - DVT/VTE  - Cardiac arrhythmia  - Respiratory Failure/COPD  - Renal failure  - Anemia  - Advanced Liver disease  DVT Prophylaxis - Xarelto Weight bearing as tolerated. Continue therapy.  Potassium 3.3 this AM, two doses of 40 mEq KCl ordered.  Plan is to go Home after hospital stay. Plan for discharge later today if meeting goals with physical therapy. Scheduled for OPPT at [facility]) Follow-up in the office on December 7th.  The PDMP database was reviewed today prior to any opioid medications being prescribed to this patient.  Theresa Duty, PA-C Orthopedic Surgery [phone] 10/09/2020, 7:50 AM

## 2020-10-09 NOTE — Progress Notes (Signed)
Physical Therapy Treatment Patient Details Name: Renee Maldonado MRN: [id number] DOB: 01/04/1946 Today's Date: 10/09/2020    History of Present Illness Patient is 74 y.o. female s/p Rt TKA on 10/08/20 with PMH significant for HTN, HLD, GERD, COPD, breast cancer, OA, anxiety, Lt TKA in 2012.    PT Comments    Pt reviewed HEP handout and demonstrated each exercise. Pt required increased time to read through handout and ask questions.  Pt then educated on safe stair technique and performed with spouse holding RW.  Pt reports understanding and provided with stair technique handout as well.  Pt ready to d/c home today.   Follow Up Recommendations  Follow surgeon's recommendation for DC plan and follow-up therapies     Equipment Recommendations  None recommended by PT    Recommendations for Other Services       Precautions / Restrictions Precautions Precautions: Fall;Knee Required Braces or Orthoses: Knee Immobilizer - Right Restrictions Other Position/Activity Restrictions: WBAT    Mobility  Bed Mobility Overal bed mobility: Needs Assistance Bed Mobility: Supine to Sit     Supine to sit: Supervision;HOB elevated     General bed mobility comments: increased time; pt self assists LE with UEs  Transfers Overall transfer level: Needs assistance Equipment used: Rolling walker (2 wheeled) Transfers: Sit to/from Stand Sit to Stand: Min guard         General transfer comment: verbal cues for UE and LE positioning (step by step instruction)  Ambulation/Gait Ambulation/Gait assistance: Min guard Gait Distance (Feet): 80 Feet Assistive device: Rolling walker (2 wheeled) Gait Pattern/deviations: Decreased weight shift to right;Step-to pattern;Antalgic Gait velocity: decr   General Gait Details: verbal cues for sequence, RW positioning, step length   Stairs Stairs: Yes Stairs assistance: Min guard Stair Management: Backwards;With walker;Step to pattern Number of Stairs:  2 General stair comments: verbal cues for sequence and RW positioning; spouse present and held RW; also performed one step with RW forwards and backwards step to pattern   Wheelchair Mobility    Modified Rankin (Stroke Patients Only)       Balance                                            Cognition Arousal/Alertness: Awake/alert Behavior During Therapy: WFL for tasks assessed/performed Overall Cognitive Status: Within Functional Limits for tasks assessed                                        Exercises      General Comments        Pertinent Vitals/Pain Pain Assessment: 0-10 Pain Score: 4  Pain Location: Rt knee Pain Descriptors / Indicators: Aching;Discomfort;Sore Pain Intervention(s): Repositioned;Monitored during session    Home Living                      Prior Function            PT Goals (current goals can now be found in the care plan section) Progress towards PT goals: Progressing toward goals    Frequency    7X/week      PT Plan Current plan remains appropriate    Co-evaluation              AM-PAC PT "6 Clicks" Mobility  Outcome Measure  Help needed turning from your back to your side while in a flat bed without using bedrails?: None Help needed moving from lying on your back to sitting on the side of a flat bed without using bedrails?: A Little Help needed moving to and from a bed to a chair (including a wheelchair)?: A Little Help needed standing up from a chair using your arms (e.g., wheelchair or bedside chair)?: A Little Help needed to walk in hospital room?: A Little Help needed climbing 3-5 steps with a railing? : A Little 6 Click Score: 19    End of Session Equipment Utilized During Treatment: Gait belt Activity Tolerance: Patient tolerated treatment well Patient left: in chair;with call bell/phone within reach;with chair alarm set;with family/visitor present   PT Visit  Diagnosis: Muscle weakness (generalized) (M62.81);Difficulty in walking, not elsewhere classified (R26.2)     Time: 4159-7331 PT Time Calculation (min) (ACUTE ONLY): 47 min  Charges:  $Gait Training: 8-22 mins $Therapeutic Exercise: 8-22 mins                    Jannette Spanner PT, DPT Acute Rehabilitation Services Pager: [phone] Office: [phone]  Trena Platt 10/09/2020, 3:34 PM

## 2020-10-09 NOTE — Progress Notes (Signed)
Physical Therapy Treatment Patient Details Name: Renee Maldonado MRN: [id number] DOB: 12-Jul-1946 Today's Date: 10/09/2020    History of Present Illness Patient is 74 y.o. female s/p Rt TKA on 10/08/20 with PMH significant for HTN, HLD, GERD, COPD, breast cancer, OA, anxiety, Lt TKA in 2012.    PT Comments    Pt assisted with ambulating in hallway.  Reviewed safe transfer technique and use of KI with pt and spouse observing.    Follow Up Recommendations  Follow surgeon's recommendation for DC plan and follow-up therapies     Equipment Recommendations  None recommended by PT    Recommendations for Other Services       Precautions / Restrictions Precautions Precautions: Fall;Knee Required Braces or Orthoses: Knee Immobilizer - Right Restrictions Other Position/Activity Restrictions: WBAT    Mobility  Bed Mobility Overal bed mobility: Needs Assistance Bed Mobility: Supine to Sit     Supine to sit: Min guard;HOB elevated     General bed mobility comments: increased time; pt self assists LE with UEs  Transfers Overall transfer level: Needs assistance Equipment used: Rolling walker (2 wheeled) Transfers: Sit to/from Stand Sit to Stand: Min guard         General transfer comment: verbal cues for UE and LE positioning (step by step instruction as pt trying to pull up on RW)  Ambulation/Gait Ambulation/Gait assistance: Min guard Gait Distance (Feet): 120 Feet Assistive device: Rolling walker (2 wheeled) Gait Pattern/deviations: Decreased weight shift to right;Step-to pattern;Antalgic Gait velocity: decr   General Gait Details: verbal cues for sequence, RW positioning, step length   Stairs             Wheelchair Mobility    Modified Rankin (Stroke Patients Only)       Balance                                            Cognition Arousal/Alertness: Awake/alert Behavior During Therapy: WFL for tasks assessed/performed Overall  Cognitive Status: Within Functional Limits for tasks assessed                                        Exercises      General Comments        Pertinent Vitals/Pain Pain Assessment: 0-10 Pain Score: 5  Pain Location: Rt knee Pain Descriptors / Indicators: Aching;Discomfort;Sore Pain Intervention(s): Monitored during session;Repositioned    Home Living                      Prior Function            PT Goals (current goals can now be found in the care plan section) Progress towards PT goals: Progressing toward goals    Frequency    7X/week      PT Plan Current plan remains appropriate    Co-evaluation              AM-PAC PT "6 Clicks" Mobility   Outcome Measure  Help needed turning from your back to your side while in a flat bed without using bedrails?: None Help needed moving from lying on your back to sitting on the side of a flat bed without using bedrails?: A Little Help needed moving to and from a bed to a chair (including  a wheelchair)?: A Little Help needed standing up from a chair using your arms (e.g., wheelchair or bedside chair)?: A Little Help needed to walk in hospital room?: A Little Help needed climbing 3-5 steps with a railing? : A Little 6 Click Score: 19    End of Session Equipment Utilized During Treatment: Gait belt Activity Tolerance: Patient tolerated treatment well Patient left: in chair;with call bell/phone within reach;with chair alarm set;with family/visitor present   PT Visit Diagnosis: Muscle weakness (generalized) (M62.81);Difficulty in walking, not elsewhere classified (R26.2)     Time: 4327-6147 PT Time Calculation (min) (ACUTE ONLY): 23 min  Charges:  $Gait Training: 23-37 mins                     Jannette Spanner PT, DPT Acute Rehabilitation Services Pager: [phone] Office: [phone]  York Ram E 10/09/2020, 1:19 PM

## 2020-10-09 NOTE — TOC Transition Note (Signed)
Transition of Care [facility]) - CM/SW Discharge Note   Patient Details  Name: Renee Maldonado MRN: [id number] Date of Birth: 06/01/1946  Transition of Care [facility]) CM/SW Contact:  Lia Hopping, [place] Phone Number: 10/09/2020, 10:48 AM   Clinical Narrative:              York Ram discharge plan of care, see Ortho CM note.        Discharge Plan and Services                DME Arranged: N/A               Social Determinants of Health (SDOH) Interventions     Readmission Risk Interventions No flowsheet data found.

## 2020-10-15 ENCOUNTER — Other Ambulatory Visit: Payer: Self-pay | Admitting: Family Medicine

## 2020-10-15 ENCOUNTER — Ambulatory Visit: Payer: Medicare HMO | Attending: Orthopedic Surgery | Admitting: Physical Therapy

## 2020-10-15 ENCOUNTER — Encounter: Payer: Self-pay | Admitting: Physical Therapy

## 2020-10-15 ENCOUNTER — Other Ambulatory Visit: Payer: Self-pay

## 2020-10-15 DIAGNOSIS — G4709 Other insomnia: Secondary | ICD-10-CM

## 2020-10-15 DIAGNOSIS — M6281 Muscle weakness (generalized): Secondary | ICD-10-CM | POA: Diagnosis present

## 2020-10-15 DIAGNOSIS — M25561 Pain in right knee: Secondary | ICD-10-CM | POA: Insufficient documentation

## 2020-10-15 DIAGNOSIS — R600 Localized edema: Secondary | ICD-10-CM | POA: Diagnosis present

## 2020-10-15 DIAGNOSIS — E785 Hyperlipidemia, unspecified: Secondary | ICD-10-CM

## 2020-10-15 DIAGNOSIS — M25661 Stiffness of right knee, not elsewhere classified: Secondary | ICD-10-CM | POA: Diagnosis present

## 2020-10-15 NOTE — Discharge Summary (Signed)
Physician Discharge Summary   Patient ID: Renee Maldonado MRN: [id number] DOB/AGE: Feb 13, 1946 74 y.o.  Admit date: 10/08/2020 Discharge date: 10/09/2020  Primary Diagnosis:  Osteoarthritis of Right Knee  Admission Diagnoses:  Past Medical History:  Diagnosis Date  . Anxiety   . Arthritis   . Cancer [facility])    breast Right no chemo or radiation  on Arimidex  . COPD (chronic obstructive pulmonary disease) ([place])    on Breo  . GERD (gastroesophageal reflux disease)   . Humerus fracture 2007  . Hyperlipidemia   . Hypertension   . Insomnia    Discharge Diagnoses:   Principal Problem:   OA (osteoarthritis) of knee Active Problems:   Primary osteoarthritis of right knee  Estimated body mass index is 34.75 kg/m as calculated from the following:   Height as of this encounter: 5\' 2"  (1.575 m).   Weight as of this encounter: 86.2 kg.  Procedure:  Procedure(s) (LRB): TOTAL KNEE ARTHROPLASTY (Right)   Consults: None  HPI: Renee Maldonado is a 74 y.o. year old female with end stage OA of her right knee with progressively worsening pain and dysfunction. She has constant pain, with activity and at rest and significant functional deficits with difficulties even with ADLs. She has had extensive non-op management including analgesics, injections of cortisone and viscosupplements, and home exercise program, but remains in significant pain with significant dysfunction.Radiographs show bone on bone arthritis medial and patellofemoral. She presents now for right Total Knee Arthroplasty.    Laboratory Data: Admission on 10/08/2020, Discharged on 10/09/2020  Component Date Value Ref Range Status  . ABO/RH(D) 10/08/2020    Final                   Value:O POS Performed at [facility], [place] [address], [place], [place] [address]   . WBC 10/09/2020 12.4* 4.0 - 10.5 K/uL Final  . RBC 10/09/2020 3.56* 3.87 - 5.11 MIL/uL Final  . Hemoglobin 10/09/2020 9.9* 12.0 - 15.0 g/dL Final  .  HCT 10/09/2020 30.1* 36 - 46 % Final  . MCV 10/09/2020 84.6  80.0 - 100.0 fL Final  . MCH 10/09/2020 27.8  26.0 - 34.0 pg Final  . MCHC 10/09/2020 32.9  30.0 - 36.0 g/dL Final  . RDW 10/09/2020 13.2  11.5 - 15.5 % Final  . Platelets 10/09/2020 261  150 - 400 K/uL Final  . nRBC 10/09/2020 0.0  0.0 - 0.2 % Final   Performed at [facility], [place] [address] [place], [place] [address]  . Sodium 10/09/2020 135  135 - 145 mmol/L Final  . Potassium 10/09/2020 3.3* 3.5 - 5.1 mmol/L Final  . Chloride 10/09/2020 97* 98 - 111 mmol/L Final  . CO2 10/09/2020 26  22 - 32 mmol/L Final  . Glucose, Bld 10/09/2020 158* 70 - 99 mg/dL Final   Glucose reference range applies only to samples taken after fasting for at least 8 hours.  . BUN 10/09/2020 19  8 - 23 mg/dL Final  . Creatinine, Ser 10/09/2020 1.46* 0.44 - 1.00 mg/dL Final  . Calcium 10/09/2020 8.4* 8.9 - 10.3 mg/dL Final  . GFR, Estimated 10/09/2020 37* >60 mL/min Final   Comment: (NOTE) Calculated using the CKD-EPI Creatinine Equation (2021)   . Anion gap 10/09/2020 12  5 - 15 Final   Performed at [facility], [place] [address], [place], [place] [address]  Hospital Outpatient Visit on 10/04/2020  Component Date Value Ref Range Status  .  SARS Coronavirus 2 10/04/2020 NEGATIVE  NEGATIVE Final   Comment: (NOTE) SARS-CoV-2 target nucleic acids are NOT DETECTED.  The SARS-CoV-2 RNA is generally detectable in upper and lower respiratory specimens during the acute phase of infection. Negative results do not preclude SARS-CoV-2 infection, do not rule out co-infections with other pathogens, and should not be used as the sole basis for treatment or other patient management decisions. Negative results must be combined with clinical observations, patient history, and epidemiological information. The expected result is Negative.  Fact Sheet for Patients: SugarRoll.be  Fact Sheet for  Healthcare Providers: https://www.woods-mathews.com/  This test is not yet approved or cleared by the [place] FDA and  has been authorized for detection and/or diagnosis of SARS-CoV-2 by FDA under an Emergency Use Authorization (EUA). This EUA will remain  in effect (meaning this test can be used) for the duration of the COVID-19 declaration under Se                          ction 564(b)(1) of the Act, 21 U.S.C. section 360bbb-3(b)(1), unless the authorization is terminated or revoked sooner.  Performed at [facility], [place] [address] [place], [place] [address]   Hospital Outpatient Visit on 10/02/2020  Component Date Value Ref Range Status  . aPTT 10/02/2020 31  24 - 36 seconds Final   Performed at [facility], [place] [address], [place], [place] [address]  . WBC 10/02/2020 5.4  4.0 - 10.5 K/uL Final  . RBC 10/02/2020 4.11  3.87 - 5.11 MIL/uL Final  . Hemoglobin 10/02/2020 11.3* 12.0 - 15.0 g/dL Final  . HCT 10/02/2020 35.6* 36 - 46 % Final  . MCV 10/02/2020 86.6  80.0 - 100.0 fL Final  . MCH 10/02/2020 27.5  26.0 - 34.0 pg Final  . MCHC 10/02/2020 31.7  30.0 - 36.0 g/dL Final  . RDW 10/02/2020 13.8  11.5 - 15.5 % Final  . Platelets 10/02/2020 312  150 - 400 K/uL Final  . nRBC 10/02/2020 0.0  0.0 - 0.2 % Final   Performed at [facility], [place] [address], [place], [place] [address]  . Sodium 10/02/2020 138  135 - 145 mmol/L Final  . Potassium 10/02/2020 3.8  3.5 - 5.1 mmol/L Final  . Chloride 10/02/2020 99  98 - 111 mmol/L Final  . CO2 10/02/2020 26  22 - 32 mmol/L Final  . Glucose, Bld 10/02/2020 110* 70 - 99 mg/dL Final   Glucose reference range applies only to samples taken after fasting for at least 8 hours.  . BUN 10/02/2020 18  8 - 23 mg/dL Final  . Creatinine, Ser 10/02/2020 1.41* 0.44 - 1.00 mg/dL Final  . Calcium 10/02/2020 9.9  8.9 - 10.3 mg/dL Final  . Total Protein 10/02/2020 7.5  6.5 - 8.1 g/dL Final  .  Albumin 10/02/2020 4.9  3.5 - 5.0 g/dL Final  . AST 10/02/2020 23  15 - 41 U/L Final  . ALT 10/02/2020 23  0 - 44 U/L Final  . Alkaline Phosphatase 10/02/2020 74  38 - 126 U/L Final  . Total Bilirubin 10/02/2020 0.8  0.3 - 1.2 mg/dL Final  . GFR, Estimated 10/02/2020 39* >60 mL/min Final   Comment: (NOTE) Calculated using the CKD-EPI Creatinine Equation (2021)   . Anion gap 10/02/2020 13  5 - 15 Final   Performed at [facility], [place] [address], [place], [place] [address]  . Prothrombin  Time 10/02/2020 12.2  11.4 - 15.2 seconds Final  . INR 10/02/2020 0.9  0.8 - 1.2 Final   Comment: (NOTE) INR goal varies based on device and disease states. Performed at [facility], [place] [address], [place], [place] [address]   . MRSA, PCR 10/02/2020 NEGATIVE  NEGATIVE Final  . Staphylococcus aureus 10/02/2020 NEGATIVE  NEGATIVE Final   Comment: (NOTE) The Xpert SA Assay (FDA approved for NASAL specimens in patients 35 years of age and older), is one component of a comprehensive surveillance program. It is not intended to diagnose infection nor to guide or monitor treatment. Performed at [facility], [place] [address], [place],  [address]   . ABO/RH(D) 10/02/2020 O POS   Final  . Antibody Screen 10/02/2020 NEG   Final  . Sample Expiration 10/02/2020 10/11/2020,2359   Final  . Extend sample reason 10/02/2020    Final                   Value:NO TRANSFUSIONS OR PREGNANCY IN THE PAST 3 MONTHS Performed at [facility], [place] [address] [place],  [address]      X-Rays:No results found.  EKG: Orders placed or performed during the hospital encounter of 10/02/20  . EKG 12 lead per protocol  . EKG 12 lead per protocol     Hospital Course: Renee Maldonado is a 74 y.o. who was admitted to [facility] They were brought to the operating room on 10/08/2020 and underwent Procedure(s): TOTAL KNEE ARTHROPLASTY.   Patient tolerated the procedure well and was later transferred to the recovery room and then to the orthopaedic floor for postoperative care. They were given PO and IV analgesics for pain control following their surgery. They were given 24 hours of postoperative antibiotics of  Anti-infectives (From admission, onward)   Start     Dose/Rate Route Frequency Ordered Stop   10/08/20 2000  ceFAZolin (ANCEF) IVPB 2g/100 mL premix        2 g 200 mL/hr over 30 Minutes Intravenous Every 6 hours 10/08/20 1654 10/09/20 0314   10/08/20 1115  ceFAZolin (ANCEF) IVPB 2g/100 mL premix        2 g 200 mL/hr over 30 Minutes Intravenous On call to O.R. 10/08/20 1110 10/08/20 1425     and started on DVT prophylaxis in the form of Xarelto and TED hose.   PT and OT were ordered for total joint protocol. Discharge planning consulted to help with postop disposition and equipment needs.  Patient had a uneventful night on the evening of surgery. They started to get up OOB with therapy on 10/08/2020. Pt was seen during rounds and was ready to go home pending progress with therapy. Patient worked with therapy on POD #1 and was meeting goals. Pt was discharged to home later that day in stable condition.  Diet: Resume regular diet Activity: Weight bearing as tolerated Follow-up: in two weeks in clinic Disposition: Home Discharged Condition: good   Discharge Instructions    Call MD / Call 911   Complete by: As directed    If you experience chest pain or shortness of breath, CALL 911 and be transported to the hospital emergency room.  If you develope a fever above 101 F, pus (white drainage) or increased drainage or redness at the wound, or calf pain, call your surgeon's office.   Change dressing   Complete by: As directed    You may remove the bulky bandage (ACE  wrap and gauze) two days after surgery. You will have an adhesive waterproof bandage underneath. Leave this in place until your first follow-up appointment.    Constipation Prevention   Complete by: As directed    Drink plenty of fluids.  Prune juice may be helpful.  You may use a stool softener, such as Colace (over the counter) 100 mg twice a day.  Use MiraLax (over the counter) for constipation as needed.   Diet - low sodium heart healthy   Complete by: As directed    Do not put a pillow under the knee. Place it under the heel.   Complete by: As directed    Driving restrictions   Complete by: As directed    No driving for two weeks   TED hose   Complete by: As directed    Use stockings (TED hose) for three weeks on both leg(s).  You may remove them at night for sleeping.   Weight bearing as tolerated   Complete by: As directed      Allergies as of 10/09/2020      Reactions   Benadryl [diphenhydramine Hcl]    Penicillins    Had a rash around 20 years ago but unsure if it come from medicine.  Tolerated Cephalosporin Date: 10/09/20.      Medication List    STOP taking these medications   aspirin 325 MG tablet   [employer] Body Pain Ex St 500-32.5 MG Tabs Generic drug: Aspirin-Caffeine   CALCIUM + D + K PO   CALCIUM 600/VITAMIN D PO   cholecalciferol 25 MCG (1000 UNIT) tablet Commonly known as: VITAMIN D3   Lysine 500 MG Tabs   Melatonin 10 MG Tabs   melatonin 5 MG Tabs   meloxicam 15 MG tablet Commonly known as: MOBIC   mirabegron ER 50 MG Tb24 tablet Commonly known as: Myrbetriq   ondansetron 8 MG disintegrating tablet Commonly known as: ZOFRAN-ODT   RECLAST IV     TAKE these medications   acetaminophen 500 MG tablet Commonly known as: TYLENOL Take 500 mg by mouth every 6 (six) hours as needed for moderate pain.   albuterol 108 (90 Base) MCG/ACT inhaler Commonly known as: VENTOLIN HFA 2 puffs in lungs every 6 hours as needed for wheezing/short of breath. What changed: See the new instructions.   amLODipine 5 MG tablet Commonly known as: NORVASC TAKE 1 TABLET DAILY   anastrozole 1 MG tablet Commonly  known as: ARIMIDEX Take 1 mg by mouth daily.   Breo Ellipta 100-25 MCG/INH Aepb Generic drug: fluticasone furoate-vilanterol INHALE 1 PUFF ONCE DAILY What changed:   how much to take  how to take this  when to take this  additional instructions   Chlor-Trimeton 4 MG tablet Generic drug: chlorpheniramine Take 4 mg by mouth in the morning and at bedtime.   clindamycin 300 MG capsule Commonly known as: CLEOCIN Take 300 mg by mouth See admin instructions. Take 600 mg by mouth one hour prior to dental appointment.   famotidine 40 MG tablet Commonly known as: PEPCID TAKE 1 TABLET DAILY   fluticasone 50 MCG/ACT nasal spray Commonly known as: FLONASE Place 2 sprays into both nostrils daily.   gabapentin 300 MG capsule Commonly known as: NEURONTIN Take a 300 mg capsule three times a day for two weeks following surgery.Then take a 300 mg capsule two times a day for two weeks. Then take a 300 mg capsule once a day for two weeks. Then discontinue.  hydrochlorothiazide 25 MG tablet Commonly known as: HYDRODIURIL TAKE 1 TABLET DAILY   methocarbamol 500 MG tablet Commonly known as: ROBAXIN Take 1 tablet (500 mg total) by mouth every 6 (six) hours as needed for muscle spasms.   oxyCODONE 5 MG immediate release tablet Commonly known as: Oxy IR/ROXICODONE Take 1-2 tablets (5-10 mg total) by mouth every 6 (six) hours as needed for severe pain.   Restasis 0.05 % ophthalmic emulsion Generic drug: cycloSPORINE Place 1 drop into both eyes 2 (two) times daily as needed (dry eyes).   rivaroxaban 10 MG Tabs tablet Commonly known as: XARELTO Take 1 tablet (10 mg total) by mouth daily with breakfast for 20 days. Then take one 81 mg aspirin once a day for three weeks. Then discontinue aspirin.   rosuvastatin 20 MG tablet Commonly known as: CRESTOR Take 1 tablet (20 mg total) by mouth daily. What changed: when to take this   traMADol 50 MG tablet Commonly known as: ULTRAM Take 1-2  tablets (50-100 mg total) by mouth every 6 (six) hours as needed for moderate pain.   traZODone 50 MG tablet Commonly known as: DESYREL Take 1 tablet (50 mg total) by mouth at bedtime.   valACYclovir 500 MG tablet Commonly known as: VALTREX TAKE (1) TABLET DAILY AS NEEDED. What changed:   how much to take  how to take this  when to take this  reasons to take this  additional instructions            Discharge Care Instructions  (From admission, onward)         Start     Ordered   10/09/20 0000  Weight bearing as tolerated        10/09/20 0755   10/09/20 0000  Change dressing       Comments: You may remove the bulky bandage (ACE wrap and gauze) two days after surgery. You will have an adhesive waterproof bandage underneath. Leave this in place until your first follow-up appointment.   10/09/20 0755          Follow-up Information    Gaynelle Arabian, MD. Go on 10/23/2020.   Specialty: Orthopedic Surgery Why: You are scheduled for first post op appointment on Tuesday December 7th at 3:30pm. Contact information: [address] [place] [address] [phone]               Signed: Fenton Foy, [place], PA-C Orthopedic Surgery 10/15/2020, 9:09 AM

## 2020-10-15 NOTE — Therapy (Signed)
[facility] [place], [place], [address] Phone: [phone]   Fax:  [phone]  Physical Therapy Evaluation  Patient Details  Name: Renee Maldonado MRN: [id number] Date of Birth: [birth year] Referring Provider (PT): Gaynelle Arabian, MD   Encounter Date: 10/15/2020   PT End of Session - 10/15/20 1246    Visit Number 1    Number of Visits 12    Date for PT Re-Evaluation 11/26/20    Authorization Type A[employer]na Medicare (CQ Modifier); FOTO, progress note every 10th visit    PT Start Time 1119    PT Stop Time 1200    PT Time Calculation (min) 41 min    Equipment Utilized During Treatment Right knee immobilizer   Rolling walker   Activity Tolerance Patient tolerated treatment well    Behavior During Therapy [name] for tasks assessed/performed           Past Medical History:  Diagnosis Date  . Anxiety   . Arthritis   . Cancer [facility])    breast Right no chemo or radiation  on Arimidex  . COPD (chronic obstructive pulmonary disease) ([place])    on Breo  . GERD (gastroesophageal reflux disease)   . Humerus fracture 2007  . Hyperlipidemia   . Hypertension   . Insomnia     Past Surgical History:  Procedure Laterality Date  . BREAST LUMPECTOMY Right 02/2019  . BREAST SURGERY Right 01/2019   breast biopsy  . JOINT REPLACEMENT     left knee  . Left shoulder surgery     2007  . TOTAL KNEE ARTHROPLASTY     2012 left  . TOTAL KNEE ARTHROPLASTY Right 10/08/2020   Procedure: TOTAL KNEE ARTHROPLASTY;  Surgeon: Gaynelle Arabian, MD;  Location: [facility];  Service: Orthopedics;  Laterality: Right;  . TUBAL LIGATION      There were no vitals filed for this visit.    Subjective Assessment - 10/15/20 1239    Subjective COVID-19 screening performed upon arrival. Patient arrives to physical therapy with her husband with reports of right knee pain, difficulty walking, and difficulty performing ADLs secondary to a right TKA on 10/08/2020. Patient  reports gaining assistance from husband and daughter for ADLs such as dressing and showers. Patient ambulates around with a rolling walker and right LE immobilizer and is consistent with HEP provided by surgeon. Patient reported one fall on her buttocks when she returned home from the hospital but stated she did not feel like she hurt the knee. Patient's pain at worst rated as 9/10 and pain at best as 1-2/10 with pain medication. Patient's goals are to decrease pain, improve movement, improve ability to perform ADLs and home activities independently, and return to driving.    Patient is accompained by: Family member   Husband, Ronnie   Pertinent History R TKA 10/08/2020; HTN, COPD, osteopenia, history of L TKA    Limitations Sitting;Standing;Walking;House hold activities    Patient Stated Goals drive and run errands    Currently in Pain? Yes    Pain Score 5     Pain Location Knee    Pain Orientation Right    Pain Descriptors / Indicators Discomfort    Pain Type Surgical pain    Pain Onset 1 to 4 weeks ago    Pain Frequency Constant    Aggravating Factors  movement, sitting too long    Pain Relieving Factors pain medicaion    Effect of Pain on Daily Activities unable to perform activities without  help.              [facility] PT Assessment - 10/15/20 0001      Assessment   Medical Diagnosis Presence of right artificial knee joint    Referring Provider (PT) Gaynelle Arabian, MD    Onset Date/Surgical Date 10/08/20    Next MD Visit 10/23/2020    Prior Therapy not for R knee      Precautions   Precautions Other (comment)    Precaution Comments no ultrasound      Restrictions   Weight Bearing Restrictions No      Balance Screen   Has the patient fallen in the past 6 months Yes    How many times? 1    Has the patient had a decrease in activity level because of a fear of falling?  Yes    Is the patient reluctant to leave their home because of a fear of falling?  No      Home Catering manager residence    Living Arrangements Spouse/significant other    Available Help at Discharge Family    Type of [place] to enter    Entrance Stairs-Number of Steps 2    Entrance Stairs-Rails None      Prior Function   Level of Independence Needs assistance with ADLs      Observation/Other Assessments   Observations R knee immobilizer donned; aquacel dressing donned    Skin Integrity Eccchymosis noted to R LE    Focus on Therapeutic Outcomes (FOTO)  64% limitation      Observation/Other Assessments-Edema    Edema Circumferential      Circumferential Edema   Circumferential - Right 47.5 cm mid patella    Circumferential - Left  42.0 cm mid patella      ROM / Strength   AROM / PROM / Strength AROM;PROM      AROM   Overall AROM  Deficits;Due to pain    AROM Assessment Site Knee    Right/Left Knee Right    Right Knee Extension 6    Right Knee Flexion 52      PROM   Overall PROM  Deficits;Due to pain    PROM Assessment Site Knee    Right/Left Knee Right    Right Knee Extension 4    Right Knee Flexion 68      Palpation   Patella mobility grossly limited secondary to edema    Palpation comment increased tenderness to palpation to right lateral knee.      Transfers   Comments requires Min A at right LE to raise LE on/off plinth; slow sit to stand transfers with 1 UE on rolling walker and other on sitting surface, knee extended secondary to immobilizer.       Ambulation/Gait   Assistive device Rolling walker    Gait Pattern Step-to pattern;Decreased step length - left;Decreased stance time - right;Decreased stride length;Decreased hip/knee flexion - right;Decreased weight shift to right;Right foot flat;Antalgic                      Objective measurements completed on examination: See above findings.       [facility] Adult PT Treatment/Exercise - 10/15/20 0001      Modalities   Modalities Vasopneumatic       Vasopneumatic   Number Minutes Vasopneumatic  10 minutes    Vasopnuematic Location  Knee    Vasopneumatic  Pressure Low    Vasopneumatic Temperature  34 for pain and edema                  PT Education - 10/15/20 1245    Education Details Continue HEP provided by surgeon, ice and elevate 10-15 mins 3-4x per day    Person(s) Educated Patient;Spouse    Methods Explanation    Comprehension Verbalized understanding               PT Long Term Goals - 10/15/20 1322      PT LONG TERM GOAL #1   Title Patient will be independent with HEP and its progression.    Time 6    Period Weeks    Status New      PT LONG TERM GOAL #2   Title Patient will demonstrate 5 degrees or less of right knee extension AROM to improve gait mechanics.    Time 6    Period Weeks    Status New      PT LONG TERM GOAL #3   Title Patient will demonstrate 115+ degrees of right knee flexion AROM to improve functional tasks.    Time 6    Period Weeks    Status New      PT LONG TERM GOAL #4   Title Patient will demonstrate 4/5 right knee MMT to improve stability during functional tasks.      PT LONG TERM GOAL #5   Title Patient will report ability to perform ADLs and home activities with right knee pain less than or equal to 3/10.    Time 6    Period Weeks    Status New      Additional Long Term Goals   Additional Long Term Goals Yes      PT LONG TERM GOAL #6   Title Patient will demonstrate reciprocating stair negotiation with one railing to safely enter and exit home.    Time 6    Period Weeks    Status New                  Plan - 10/15/20 1248    Clinical Impression Statement Patient is a 74 year old female who presents to physical therapy with her husband with decreased R knee ROM, difficulty walking, and increased R knee edema secondary to a R TKA on 10/08/2020. Patient requires assistance at [place] for bed mobility. Patient and PT discussed plan of care and HEP to which she  reported understanding. Patient also instucted on icing for R knee and elevation for edema control. Patient would benefit from skilled physical therapy to address deficits and patient's goals.    Personal Factors and Comorbidities Age;Comorbidity 3+    Comorbidities R TKA 10/08/2020; HTN, COPD, osteopenia, history of L TKA    Examination-Activity Limitations Bathing;Locomotion Level;Transfers;Stand;Stairs;Dressing;Hygiene/Grooming;Sit;Bed Mobility    Examination-Participation Restrictions Driving;Meal Prep    Stability/Clinical Decision Making Stable/Uncomplicated    Clinical Decision Making Low    Rehab Potential Good    PT Frequency 3x / week    PT Duration 4 weeks    PT Treatment/Interventions ADLs/Self Care Home Management;Cryotherapy;Electrical Stimulation;Gait training;Stair training;Functional mobility training;Moist Heat;Therapeutic activities;Balance training;Therapeutic exercise;Neuromuscular re-education;Manual techniques;Passive range of motion;Patient/family education;Vasopneumatic Device;Taping    PT Next Visit Plan nustep AROM to R knee, PROM to R knee, Modalities PRN for pain relief    PT Home Exercise Plan continue HEP provided by surgeon    Consulted and Agree with Plan of Care Patient;Family  member/caregiver    Family Member Consulted Husband, Ronnie           Patient will benefit from skilled therapeutic intervention in order to improve the following deficits and impairments:  Abnormal gait, Decreased activity tolerance, Decreased balance, Decreased strength, Decreased mobility, Decreased range of motion, Difficulty walking, Increased edema, Pain  Visit Diagnosis: Acute pain of right knee - Plan: PT plan of care cert/re-cert  Stiffness of right knee, not elsewhere classified - Plan: PT plan of care cert/re-cert  Localized edema - Plan: PT plan of care cert/re-cert  Muscle weakness (generalized) - Plan: PT plan of care cert/re-cert     Problem List Patient Active  Problem List   Diagnosis Date Noted  . OA (osteoarthritis) of knee 10/08/2020  . Primary osteoarthritis of right knee 10/08/2020  . Morbid obesity ([place]) 03/08/2015  . Vitamin D deficiency 03/08/2015  . COPD (chronic obstructive pulmonary disease) ([place]) 08/11/2014  . Fall 12/19/2013  . Hyperlipidemia 06/29/2013  . Hypertension 06/29/2013  . GAD (generalized anxiety disorder) 06/29/2013  . Diverticulosis of colon without hemorrhage 06/29/2013  . GERD (gastroesophageal reflux disease) 06/29/2013  . Insomnia 06/29/2013  . Osteopenia 06/29/2013    Gabriela Eves, PT, DPT 10/15/2020, 1:44 PM  [facility] [place], [place], [address] Phone: [phone]   Fax:  [phone]  Name: Renee Maldonado MRN: [id number] Date of Birth: 07-16-1946

## 2020-10-17 ENCOUNTER — Ambulatory Visit: Payer: Medicare HMO | Attending: Orthopedic Surgery | Admitting: Physical Therapy

## 2020-10-17 ENCOUNTER — Other Ambulatory Visit: Payer: Self-pay

## 2020-10-17 DIAGNOSIS — M25661 Stiffness of right knee, not elsewhere classified: Secondary | ICD-10-CM | POA: Insufficient documentation

## 2020-10-17 DIAGNOSIS — M25561 Pain in right knee: Secondary | ICD-10-CM | POA: Insufficient documentation

## 2020-10-17 DIAGNOSIS — M6281 Muscle weakness (generalized): Secondary | ICD-10-CM | POA: Insufficient documentation

## 2020-10-17 DIAGNOSIS — R600 Localized edema: Secondary | ICD-10-CM | POA: Insufficient documentation

## 2020-10-17 NOTE — Therapy (Signed)
[facility] [place], [place], [address] Phone: [phone]   Fax:  [phone]  Physical Therapy Treatment  Patient Details  Name: Renee Maldonado MRN: [id number] Date of Birth: Aug 24, 1946 Referring Provider (PT): Gaynelle Arabian, MD   Encounter Date: 10/17/2020   PT End of Session - 10/17/20 1125    Visit Number 2    Number of Visits 12    Authorization Type Aetna Medicare ([place] Modifier); FOTO, progress note every 10th visit    PT Start Time 1115    PT Stop Time 1202    PT Time Calculation (min) 47 min    Activity Tolerance Patient tolerated treatment well    Behavior During Therapy [facility] for tasks assessed/performed           Past Medical History:  Diagnosis Date  . Anxiety   . Arthritis   . Cancer [facility])    breast Right no chemo or radiation  on Arimidex  . COPD (chronic obstructive pulmonary disease) ([place])    on Breo  . GERD (gastroesophageal reflux disease)   . Humerus fracture 2007  . Hyperlipidemia   . Hypertension   . Insomnia     Past Surgical History:  Procedure Laterality Date  . BREAST LUMPECTOMY Right 02/2019  . BREAST SURGERY Right 01/2019   breast biopsy  . JOINT REPLACEMENT     left knee  . Left shoulder surgery     2007  . TOTAL KNEE ARTHROPLASTY     2012 left  . TOTAL KNEE ARTHROPLASTY Right 10/08/2020   Procedure: TOTAL KNEE ARTHROPLASTY;  Surgeon: Gaynelle Arabian, MD;  Location: [facility];  Service: Orthopedics;  Laterality: Right;  . TUBAL LIGATION      There were no vitals filed for this visit.   Subjective Assessment - 10/17/20 1222    Subjective COVID-19 screening performed upon arrival. Patient reported doing well,  4/10.    Patient is accompained by: Family member    Pertinent History R TKA 10/08/2020; HTN, COPD, osteopenia, history of L TKA    Limitations Sitting;Standing;Walking;House hold activities    Patient Stated Goals drive and run errands    Currently in Pain? Yes    Pain  Score 4     Pain Location Knee    Pain Orientation Right    Pain Descriptors / Indicators Discomfort    Pain Type Surgical pain    Pain Onset 1 to 4 weeks ago    Pain Frequency Constant              [facility] PT Assessment - 10/17/20 0001      Assessment   Medical Diagnosis Presence of right artificial knee joint    Referring Provider (PT) Gaynelle Arabian, MD    Onset Date/Surgical Date 10/08/20    Next MD Visit 10/23/2020    Prior Therapy not for R knee      Precautions   Precautions Other (comment)    Precaution Comments no ultrasound                         [facility] Adult PT Treatment/Exercise - 10/17/20 0001      Exercises   Exercises Knee/Hip      Knee/Hip Exercises: Aerobic   Nustep Level 1 x10 mins seat 8 to 7 with UEs      Knee/Hip Exercises: Standing   Heel Raises Both;1 set;10 reps    Heel Raises Limitations B toe raises x10  Hip Abduction AROM;Both;1 set;10 reps;Knee straight      Knee/Hip Exercises: Seated   Heel Slides AAROM;Right;1 set;10 reps   3" hold   Ball Squeeze x10 x3"      Modalities   Modalities Vasopneumatic      Vasopneumatic   Number Minutes Vasopneumatic  10 minutes    Vasopnuematic Location  Knee    Vasopneumatic Pressure Low    Vasopneumatic Temperature  34 for pain and edema      Manual Therapy   Manual Therapy Passive ROM    Passive ROM PROM to right knee into knee flexion and extension to improve ROM; intermittent oscillations to decrease tone and pain                       PT Long Term Goals - 10/15/20 1322      PT LONG TERM GOAL #1   Title Patient will be independent with HEP and its progression.    Time 6    Period Weeks    Status New      PT LONG TERM GOAL #2   Title Patient will demonstrate 5 degrees or less of right knee extension AROM to improve gait mechanics.    Time 6    Period Weeks    Status New      PT LONG TERM GOAL #3   Title Patient will demonstrate 115+ degrees of right knee  flexion AROM to improve functional tasks.    Time 6    Period Weeks    Status New      PT LONG TERM GOAL #4   Title Patient will demonstrate 4/5 right knee MMT to improve stability during functional tasks.      PT LONG TERM GOAL #5   Title Patient will report ability to perform ADLs and home activities with right knee pain less than or equal to 3/10.    Time 6    Period Weeks    Status New      Additional Long Term Goals   Additional Long Term Goals Yes      PT LONG TERM GOAL #6   Title Patient will demonstrate reciprocating stair negotiation with one railing to safely enter and exit home.    Time 6    Period Weeks    Status New                 Plan - 10/17/20 1201    Clinical Impression Statement Patient arrives to physical therapy doing fairly well. Gait training performed without R immobilizer with step to gait pattern, decreased R stance time and decreaed left step length. Patient and PT discussed utilizing immobilizer if right knee feels weak. Patient guided through standing TEs with good form and technique after explanation and demonstration. Patient noted with intermittent guarding with right knee PROM but overall responded well. No adverse affects upon removal of modalities.    Personal Factors and Comorbidities Age;Comorbidity 3+    Comorbidities R TKA 10/08/2020; HTN, COPD, osteopenia, history of L TKA    Examination-Activity Limitations Bathing;Locomotion Level;Transfers;Stand;Stairs;Dressing;Hygiene/Grooming;Sit;Bed Mobility    Examination-Participation Restrictions Driving;Meal Prep    Stability/Clinical Decision Making Stable/Uncomplicated    Clinical Decision Making Low    Rehab Potential Good    PT Frequency 3x / week    PT Duration 4 weeks    PT Treatment/Interventions ADLs/Self Care Home Management;Cryotherapy;Electrical Stimulation;Gait training;Stair training;Functional mobility training;Moist Heat;Therapeutic activities;Balance training;Therapeutic  exercise;Neuromuscular re-education;Manual techniques;Passive range of motion;Patient/family education;Vasopneumatic Device;Taping  PT Next Visit Plan nustep AROM to R knee, PROM to R knee, Modalities PRN for pain relief    PT Home Exercise Plan continue HEP provided by surgeon    Consulted and Agree with Plan of Care Patient;Family member/caregiver    Family Member Consulted Husband, Ronnie           Patient will benefit from skilled therapeutic intervention in order to improve the following deficits and impairments:  Abnormal gait, Decreased activity tolerance, Decreased balance, Decreased strength, Decreased mobility, Decreased range of motion, Difficulty walking, Increased edema, Pain  Visit Diagnosis: Acute pain of right knee  Stiffness of right knee, not elsewhere classified  Localized edema  Muscle weakness (generalized)     Problem List Patient Active Problem List   Diagnosis Date Noted  . OA (osteoarthritis) of knee 10/08/2020  . Primary osteoarthritis of right knee 10/08/2020  . Morbid obesity ([place]) 03/08/2015  . Vitamin D deficiency 03/08/2015  . COPD (chronic obstructive pulmonary disease) ([place]) 08/11/2014  . Fall 12/19/2013  . Hyperlipidemia 06/29/2013  . Hypertension 06/29/2013  . GAD (generalized anxiety disorder) 06/29/2013  . Diverticulosis of colon without hemorrhage 06/29/2013  . GERD (gastroesophageal reflux disease) 06/29/2013  . Insomnia 06/29/2013  . Osteopenia 06/29/2013    Gabriela Eves, PT, DPT 10/17/2020, 12:23 PM  [facility] [address] [place], [place], [address] Phone: [phone]   Fax:  [phone]  Name: Renee Maldonado MRN: [id number] Date of Birth: [birth year]

## 2020-10-18 ENCOUNTER — Ambulatory Visit: Payer: Medicare HMO | Admitting: Physical Therapy

## 2020-10-18 ENCOUNTER — Other Ambulatory Visit: Payer: Self-pay

## 2020-10-18 DIAGNOSIS — M25561 Pain in right knee: Secondary | ICD-10-CM

## 2020-10-18 DIAGNOSIS — R600 Localized edema: Secondary | ICD-10-CM

## 2020-10-18 DIAGNOSIS — M25661 Stiffness of right knee, not elsewhere classified: Secondary | ICD-10-CM | POA: Diagnosis not present

## 2020-10-18 DIAGNOSIS — M6281 Muscle weakness (generalized): Secondary | ICD-10-CM

## 2020-10-18 NOTE — Therapy (Signed)
[facility] [place], [place], [address] Phone: [phone]   Fax:  [phone]  Physical Therapy Treatment  Patient Details  Name: Renee Maldonado MRN: [id number] Date of Birth: 06/04/1946 Referring Provider (PT): Gaynelle Arabian, MD   Encounter Date: 10/18/2020   PT End of Session - 10/18/20 1311    Visit Number 3    Number of Visits 12    Date for PT Re-Evaluation 11/26/20    Authorization Type Aetna Medicare ([place]Q Modifier); FOTO, progress note every 10th visit    PT Start Time 0106    PT Stop Time 0152    PT Time Calculation (min) 46 min    Equipment Utilized During Treatment Right knee immobilizer   rolling walker   Activity Tolerance Patient tolerated treatment well    Behavior During Therapy [facility] for tasks assessed/performed           Past Medical History:  Diagnosis Date  . Anxiety   . Arthritis   . Cancer [facility])    breast Right no chemo or radiation  on Arimidex  . COPD (chronic obstructive pulmonary disease) ([place])    on Breo  . GERD (gastroesophageal reflux disease)   . Humerus fracture 2007  . Hyperlipidemia   . Hypertension   . Insomnia     Past Surgical History:  Procedure Laterality Date  . BREAST LUMPECTOMY Right 02/2019  . BREAST SURGERY Right 01/2019   breast biopsy  . JOINT REPLACEMENT     left knee  . Left shoulder surgery     2007  . TOTAL KNEE ARTHROPLASTY     2012 left  . TOTAL KNEE ARTHROPLASTY Right 10/08/2020   Procedure: TOTAL KNEE ARTHROPLASTY;  Surgeon: Gaynelle Arabian, MD;  Location: [facility];  Service: Orthopedics;  Laterality: Right;  . TUBAL LIGATION      There were no vitals filed for this visit.   Subjective Assessment - 10/18/20 1310    Subjective COVID-19 screening performed upon arrival. Patient reported doing well today and after last visit.    Patient is accompained by: Family member    Pertinent History R TKA 10/08/2020; HTN, COPD, osteopenia, history of L TKA     Limitations Sitting;Standing;Walking;House hold activities    Patient Stated Goals drive and run errands    Currently in Pain? Yes    Pain Score 4     Pain Location Knee    Pain Orientation Right    Pain Descriptors / Indicators Discomfort    Pain Type Surgical pain    Pain Onset 1 to 4 weeks ago    Pain Frequency Constant    Aggravating Factors  prolong movement or sitting    Pain Relieving Factors meds/rest              [facility] PT Assessment - 10/18/20 0001      AROM   AROM Assessment Site Knee    Right/Left Knee Right    Right Knee Extension -6    Right Knee Flexion 84      PROM   PROM Assessment Site Knee    Right/Left Knee Right    Right Knee Extension -4    Right Knee Flexion 90                         [facility] Adult PT Treatment/Exercise - 10/18/20 0001      Knee/Hip Exercises: Aerobic   Nustep L2 x13min (seat 8-6)  Knee/Hip Exercises: Seated   Long Arc Quad Strengthening;AROM;Right;2 sets;10 reps      Knee/Hip Exercises: Supine   Short Arc [employer] Strengthening;AROM;Right;2 sets;10 reps      Vasopneumatic   Number Minutes Vasopneumatic  10 minutes    Vasopnuematic Location  Knee    Vasopneumatic Pressure Low    Vasopneumatic Temperature  34 for pain and edema      Manual Therapy   Manual Therapy Passive ROM    Passive ROM PROM to right knee into knee flexion and extension to improve ROM; intermittent oscillations to decrease tone and pain                       PT Long Term Goals - 10/18/20 1311      PT LONG TERM GOAL #1   Title Patient will be independent with HEP and its progression.    Time 6    Period Weeks    Status On-going      PT LONG TERM GOAL #2   Title Patient will demonstrate 5 degrees or less of right knee extension AROM to improve gait mechanics.    Time 6    Period Weeks    Status On-going      PT LONG TERM GOAL #3   Title Patient will demonstrate 115+ degrees of right knee flexion AROM to  improve functional tasks.    Time 6    Period Weeks    Status On-going      PT LONG TERM GOAL #4   Title Patient will demonstrate 4/5 right knee MMT to improve stability during functional tasks.    Time 6    Period Weeks    Status On-going      PT LONG TERM GOAL #5   Title Patient will report ability to perform ADLs and home activities with right knee pain less than or equal to 3/10.    Time 6    Period Weeks    Status On-going      PT LONG TERM GOAL #6   Title Patient will demonstrate reciprocating stair negotiation with one railing to safely enter and exit home.    Time 6    Period Weeks    Status On-going                 Plan - 10/18/20 1333    Clinical Impression Statement Patient tolerated treatment well today. patient improed with flexion ROM today and has reported doing HEP daily. Patient continues to have some discomfort and swelling. Patient is using her rolling walker and immobilizer. Patient going to MD next week for F/U. Goals progressing.    Personal Factors and Comorbidities Age;Comorbidity 3+    Comorbidities R TKA 10/08/2020; HTN, COPD, osteopenia, history of L TKA    Examination-Activity Limitations Bathing;Locomotion Level;Transfers;Stand;Stairs;Dressing;Hygiene/Grooming;Sit;Bed Mobility    Examination-Participation Restrictions Driving;Meal Prep    Stability/Clinical Decision Making Stable/Uncomplicated    Rehab Potential Good    PT Frequency 3x / week    PT Duration 4 weeks    PT Treatment/Interventions ADLs/Self Care Home Management;Cryotherapy;Electrical Stimulation;Gait training;Stair training;Functional mobility training;Moist Heat;Therapeutic activities;Balance training;Therapeutic exercise;Neuromuscular re-education;Manual techniques;Passive range of motion;Patient/family education;Vasopneumatic Device;Taping    PT Next Visit Plan cont with POC for nustep AROM to R knee, PROM to R knee, Modalities PRN for pain relief MD note next visit     Consulted and Agree with Plan of Care Patient;Family member/caregiver  Patient will benefit from skilled therapeutic intervention in order to improve the following deficits and impairments:  Abnormal gait, Decreased activity tolerance, Decreased balance, Decreased strength, Decreased mobility, Decreased range of motion, Difficulty walking, Increased edema, Pain  Visit Diagnosis: Acute pain of right knee  Stiffness of right knee, not elsewhere classified  Localized edema  Muscle weakness (generalized)     Problem List Patient Active Problem List   Diagnosis Date Noted  . OA (osteoarthritis) of knee 10/08/2020  . Primary osteoarthritis of right knee 10/08/2020  . Morbid obesity ([place]) 03/08/2015  . Vitamin D deficiency 03/08/2015  . COPD (chronic obstructive pulmonary disease) ([place]) 08/11/2014  . Fall 12/19/2013  . Hyperlipidemia 06/29/2013  . Hypertension 06/29/2013  . GAD (generalized anxiety disorder) 06/29/2013  . Diverticulosis of colon without hemorrhage 06/29/2013  . GERD (gastroesophageal reflux disease) 06/29/2013  . Insomnia 06/29/2013  . Osteopenia 06/29/2013    Chee Dimon P, PTA 10/18/2020, 1:55 PM  [facility] [place], [place], [address] Phone: [phone]   Fax:  [phone]  Name: Kamilya Wakeman MRN: [id number] Date of Birth: 01-08-1946

## 2020-10-22 ENCOUNTER — Other Ambulatory Visit: Payer: Self-pay

## 2020-10-22 ENCOUNTER — Ambulatory Visit: Payer: Medicare HMO | Admitting: Physical Therapy

## 2020-10-22 ENCOUNTER — Encounter: Payer: Self-pay | Admitting: Physical Therapy

## 2020-10-22 DIAGNOSIS — M6281 Muscle weakness (generalized): Secondary | ICD-10-CM | POA: Diagnosis not present

## 2020-10-22 DIAGNOSIS — R600 Localized edema: Secondary | ICD-10-CM

## 2020-10-22 DIAGNOSIS — M25561 Pain in right knee: Secondary | ICD-10-CM

## 2020-10-22 DIAGNOSIS — M25661 Stiffness of right knee, not elsewhere classified: Secondary | ICD-10-CM

## 2020-10-22 NOTE — Therapy (Signed)
[facility] [place], [place], [address] Phone: [phone]   Fax:  [phone]  Physical Therapy Treatment  Patient Details  Name: Renee Maldonado MRN: [id number] Date of Birth: 1946-09-16 Referring Provider (PT): Gaynelle Arabian, MD   Encounter Date: 10/22/2020   PT End of Session - 10/22/20 1119    Visit Number 4    Number of Visits 12    Date for PT Re-Evaluation 11/26/20    Authorization Type Aetna Medicare ([place] Modifier); FOTO, progress note every 10th visit    PT Start Time 1115    PT Stop Time 1206    PT Time Calculation (min) 51 min    Equipment Utilized During Treatment Other (comment)   FWW   Activity Tolerance Patient tolerated treatment well    Behavior During Therapy [facility] for tasks assessed/performed           Past Medical History:  Diagnosis Date  . Anxiety   . Arthritis   . Cancer [facility])    breast Right no chemo or radiation  on Arimidex  . COPD (chronic obstructive pulmonary disease) ([place])    on Breo  . GERD (gastroesophageal reflux disease)   . Humerus fracture 2007  . Hyperlipidemia   . Hypertension   . Insomnia     Past Surgical History:  Procedure Laterality Date  . BREAST LUMPECTOMY Right 02/2019  . BREAST SURGERY Right 01/2019   breast biopsy  . JOINT REPLACEMENT     left knee  . Left shoulder surgery     2007  . TOTAL KNEE ARTHROPLASTY     2012 left  . TOTAL KNEE ARTHROPLASTY Right 10/08/2020   Procedure: TOTAL KNEE ARTHROPLASTY;  Surgeon: Gaynelle Arabian, MD;  [facility];  Service: Orthopedics;  Laterality: Right;  . TUBAL LIGATION      There were no vitals filed for this visit.   Subjective Assessment - 10/22/20 1118    Subjective COVID-19 screening performed upon arrival. Patient reported doing well today and after last visit.    Patient is accompained by: Family member    Pertinent History R TKA 10/08/2020; HTN, COPD, osteopenia, history of L TKA    Limitations  Sitting;Standing;Walking;House hold activities    Patient Stated Goals drive and run errands    Currently in Pain? Yes    Pain Score 4     Pain Location Knee    Pain Orientation Right    Pain Descriptors / Indicators Sore;Discomfort    Pain Type Surgical pain    Pain Onset 1 to 4 weeks ago    Pain Frequency Constant              [facility] PT Assessment - 10/22/20 0001      Assessment   Medical Diagnosis Presence of right artificial knee joint    Referring Provider (PT) Gaynelle Arabian, MD    Onset Date/Surgical Date 10/08/20    Next MD Visit 10/23/2020    Prior Therapy not for R knee      Precautions   Precautions Other (comment)    Precaution Comments no ultrasound      Restrictions   Weight Bearing Restrictions No      ROM / Strength   AROM / PROM / Strength AROM      AROM   Overall AROM  Within functional limits for tasks performed    AROM Assessment Site Knee    Right/Left Knee Right    Right Knee Extension 5  Right Knee Flexion 110                         [facility] Adult PT Treatment/Exercise - 10/22/20 0001      Knee/Hip Exercises: Aerobic   Nustep L3 ,seat 7-6 x15 min      Knee/Hip Exercises: Standing   Forward Lunges Right;2 sets;10 reps;3 seconds    Forward Lunges Limitations off 6" step    Hip Abduction Stengthening;Right;20 reps;Knee straight    Rocker Board 3 minutes      Knee/Hip Exercises: Seated   Long Arc Quad AROM;Right;10 reps    Long [employer] Limitations 5 sec holds      Knee/Hip Exercises: Supine   Heel Slides AAROM;Right;15 reps      Modalities   Modalities Electrical Stimulation;Cryotherapy      Cryotherapy   Number Minutes Cryotherapy 10 Minutes    Cryotherapy Location Knee    Type of Cryotherapy Ice pack      Electrical Stimulation   Electrical Stimulation Location R knee    Electrical Stimulation Action IFC    Electrical Stimulation Parameters 80-150 hz x10 min    Electrical Stimulation Goals Pain;Edema                         PT Long Term Goals - 10/22/20 1156      PT LONG TERM GOAL #1   Title Patient will be independent with HEP and its progression.    Time 6    Period Weeks    Status On-going      PT LONG TERM GOAL #2   Title Patient will demonstrate 5 degrees or less of right knee extension AROM to improve gait mechanics.    Time 6    Period Weeks    Status Achieved      PT LONG TERM GOAL #3   Title Patient will demonstrate 115+ degrees of right knee flexion AROM to improve functional tasks.    Time 6    Period Weeks    Status On-going      PT LONG TERM GOAL #4   Title Patient will demonstrate 4/5 right knee MMT to improve stability during functional tasks.    Time 6    Period Weeks    Status On-going      PT LONG TERM GOAL #5   Title Patient will report ability to perform ADLs and home activities with right knee pain less than or equal to 3/10.    Time 6    Period Weeks    Status On-going      PT LONG TERM GOAL #6   Title Patient will demonstrate reciprocating stair negotiation with one railing to safely enter and exit home.    Time 6    Period Weeks    Status On-going                 Plan - 10/22/20 1157    Clinical Impression Statement Patient presented in clinic worried about whether she is progressing as expected. Patient using FWW for ambulation at this time with normal gait cycle with FWW. Patient guided through low level R knee ROM exercises with patient able to tolerate well. Patient's R knee measured as 5-110 deg. Post surgical aquacell in place over incision. Increased edema present as well in R knee and patient educated to continue icing and elevating to assist in reducing edema. Normal modalities response noted following removal  of the modalities.    Personal Factors and Comorbidities Age;Comorbidity 3+    Comorbidities R TKA 10/08/2020; HTN, COPD, osteopenia, history of L TKA    Examination-Activity Limitations Bathing;Locomotion  Level;Transfers;Stand;Stairs;Dressing;Hygiene/Grooming;Sit;Bed Mobility    Examination-Participation Restrictions Driving;Meal Prep    Stability/Clinical Decision Making Stable/Uncomplicated    Rehab Potential Good    PT Frequency 3x / week    PT Duration 4 weeks    PT Treatment/Interventions ADLs/Self Care Home Management;Cryotherapy;Electrical Stimulation;Gait training;Stair training;Functional mobility training;Moist Heat;Therapeutic activities;Balance training;Therapeutic exercise;Neuromuscular re-education;Manual techniques;Passive range of motion;Patient/family education;Vasopneumatic Device;Taping    PT Next Visit Plan Continue with ROM and strengthening per protocol.    PT Home Exercise Plan continue HEP provided by surgeon    Consulted and Agree with Plan of Care Patient;Family member/caregiver    Family Member Consulted Husband, Ronnie           Patient will benefit from skilled therapeutic intervention in order to improve the following deficits and impairments:  Abnormal gait, Decreased activity tolerance, Decreased balance, Decreased strength, Decreased mobility, Decreased range of motion, Difficulty walking, Increased edema, Pain  Visit Diagnosis: Acute pain of right knee  Stiffness of right knee, not elsewhere classified  Localized edema  Muscle weakness (generalized)     Problem List Patient Active Problem List   Diagnosis Date Noted  . OA (osteoarthritis) of knee 10/08/2020  . Primary osteoarthritis of right knee 10/08/2020  . Morbid obesity ([place]) 03/08/2015  . Vitamin D deficiency 03/08/2015  . COPD (chronic obstructive pulmonary disease) ([place]) 08/11/2014  . Fall 12/19/2013  . Hyperlipidemia 06/29/2013  . Hypertension 06/29/2013  . GAD (generalized anxiety disorder) 06/29/2013  . Diverticulosis of colon without hemorrhage 06/29/2013  . GERD (gastroesophageal reflux disease) 06/29/2013  . Insomnia 06/29/2013  . Osteopenia 06/29/2013    Standley Brooking,  PTA 10/22/20 12:11 PM   [facility] [place], [place], [address] Phone: [phone]   Fax:  [phone]  Name: Chandelle Harkey MRN: [id number] Date of Birth: 01-15-1946

## 2020-10-24 ENCOUNTER — Encounter: Payer: Self-pay | Admitting: Physical Therapy

## 2020-10-24 ENCOUNTER — Other Ambulatory Visit: Payer: Self-pay

## 2020-10-24 ENCOUNTER — Ambulatory Visit: Payer: Medicare HMO | Admitting: Physical Therapy

## 2020-10-24 DIAGNOSIS — R600 Localized edema: Secondary | ICD-10-CM

## 2020-10-24 DIAGNOSIS — M6281 Muscle weakness (generalized): Secondary | ICD-10-CM | POA: Diagnosis not present

## 2020-10-24 DIAGNOSIS — M25561 Pain in right knee: Secondary | ICD-10-CM

## 2020-10-24 DIAGNOSIS — M25661 Stiffness of right knee, not elsewhere classified: Secondary | ICD-10-CM | POA: Diagnosis not present

## 2020-10-24 NOTE — Therapy (Signed)
[facility] [place], [place], [address] Phone: [phone]   Fax:  [phone]  Physical Therapy Treatment  Patient Details  Name: Renee Maldonado MRN: [id number] Date of Birth: 09-Apr-1946 Referring Provider (PT): Gaynelle Arabian, MD   Encounter Date: 10/24/2020   PT End of Session - 10/24/20 1123    Visit Number 5    Number of Visits 12    Date for PT Re-Evaluation 11/26/20    Authorization Type Aetna Medicare (CQ Modifier); FOTO, progress note every 10th visit    PT Start Time 1118    PT Stop Time 1205    PT Time Calculation (min) 47 min    Equipment Utilized During Treatment Other (comment)   FWW   Activity Tolerance Patient tolerated treatment well    Behavior During Therapy [facility] for tasks assessed/performed           Past Medical History:  Diagnosis Date  . Anxiety   . Arthritis   . Cancer [facility])    breast Right no chemo or radiation  on Arimidex  . COPD (chronic obstructive pulmonary disease) ([place])    on Breo  . GERD (gastroesophageal reflux disease)   . Humerus fracture 2007  . Hyperlipidemia   . Hypertension   . Insomnia     Past Surgical History:  Procedure Laterality Date  . BREAST LUMPECTOMY Right 02/2019  . BREAST SURGERY Right 01/2019   breast biopsy  . JOINT REPLACEMENT     left knee  . Left shoulder surgery     2007  . TOTAL KNEE ARTHROPLASTY     2012 left  . TOTAL KNEE ARTHROPLASTY Right 10/08/2020   Procedure: TOTAL KNEE ARTHROPLASTY;  Surgeon: Gaynelle Arabian, MD;  Location: [facility];  Service: Orthopedics;  Laterality: Right;  . TUBAL LIGATION      There were no vitals filed for this visit.   Subjective Assessment - 10/24/20 1115    Subjective COVID-19 screening performed upon arrival. Patient very excited by her good report from surgeon. Used ice at home for edema control.    Patient is accompained by: Family member    Pertinent History R TKA 10/08/2020; HTN, COPD, osteopenia, history  of L TKA    Limitations Sitting;Standing;Walking;House hold activities    Patient Stated Goals drive and run errands    Currently in Pain? Yes    Pain Score 3     Pain Location Knee    Pain Orientation Right    Pain Descriptors / Indicators Discomfort    Pain Type Surgical pain    Pain Onset 1 to 4 weeks ago    Pain Frequency Constant              [facility] PT Assessment - 10/24/20 0001      Assessment   Medical Diagnosis Presence of right artificial knee joint    Referring Provider (PT) Gaynelle Arabian, MD    Onset Date/Surgical Date 10/08/20    Next MD Visit 11/14/2020    Prior Therapy not for R knee      Precautions   Precautions Other (comment)    Precaution Comments no ultrasound      Restrictions   Weight Bearing Restrictions No                         [facility] Adult PT Treatment/Exercise - 10/24/20 0001      Knee/Hip Exercises: Aerobic   Nustep L3, seat 7-5 x15 min  Knee/Hip Exercises: Standing   Heel Raises Both;10 reps    Forward Lunges Right;15 reps;3 seconds    Forward Lunges Limitations off 8" step    Hip Abduction Stengthening;Right;15 reps;Knee straight    Forward Step Up Right;15 reps;Hand Hold: 2;Step Height: 6"    Rocker Board 3 minutes      Knee/Hip Exercises: Supine   Short Arc Quad Sets Strengthening;Right;15 reps;Limitations    Short Arc Quad Sets Limitations 2-3 sec holds 2#      Modalities   Modalities Vasopneumatic      Vasopneumatic   Number Minutes Vasopneumatic  10 minutes    Vasopnuematic Location  Knee    Vasopneumatic Pressure Low    Vasopneumatic Temperature  34 for pain and edema                       PT Long Term Goals - 10/22/20 1156      PT LONG TERM GOAL #1   Title Patient will be independent with HEP and its progression.    Time 6    Period Weeks    Status On-going      PT LONG TERM GOAL #2   Title Patient will demonstrate 5 degrees or less of right knee extension AROM to improve gait  mechanics.    Time 6    Period Weeks    Status Achieved      PT LONG TERM GOAL #3   Title Patient will demonstrate 115+ degrees of right knee flexion AROM to improve functional tasks.    Time 6    Period Weeks    Status On-going      PT LONG TERM GOAL #4   Title Patient will demonstrate 4/5 right knee MMT to improve stability during functional tasks.    Time 6    Period Weeks    Status On-going      PT LONG TERM GOAL #5   Title Patient will report ability to perform ADLs and home activities with right knee pain less than or equal to 3/10.    Time 6    Period Weeks    Status On-going      PT LONG TERM GOAL #6   Title Patient will demonstrate reciprocating stair negotiation with one railing to safely enter and exit home.    Time 6    Period Weeks    Status On-going                 Plan - 10/24/20 1218    Clinical Impression Statement Patient presented in clinic with reports of minimal discomfort but very relieved after getting a great report from the surgeon. Patient progressed to more functional exercises such as forward step ups with only minimal VCs to avoid hip circumduction. Patient's R knee mobility has improved since getting the postsurgical aquacell off at the last appointment as well. Patient demonstrating great R quad activation as well with resisted SAQ. Minimal scabbing noted in inferior R knee incision. Patient understanding of no soap and patting incision dry after bathing. Notable R knee edema but patient educated that was normal at this time. Normal vasopneumatic response noted following removal of the modality.    Personal Factors and Comorbidities Age;Comorbidity 3+    Comorbidities R TKA 10/08/2020; HTN, COPD, osteopenia, history of L TKA    Examination-Activity Limitations Bathing;Locomotion Level;Transfers;Stand;Stairs;Dressing;Hygiene/Grooming;Sit;Bed Mobility    Examination-Participation Restrictions Driving;Meal Prep    Stability/Clinical Decision  Making Stable/Uncomplicated    Rehab Potential  Good    PT Frequency 3x / week    PT Duration 4 weeks    PT Treatment/Interventions ADLs/Self Care Home Management;Cryotherapy;Electrical Stimulation;Gait training;Stair training;Functional mobility training;Moist Heat;Therapeutic activities;Balance training;Therapeutic exercise;Neuromuscular re-education;Manual techniques;Passive range of motion;Patient/family education;Vasopneumatic Device;Taping    PT Next Visit Plan Continue with ROM and strengthening per protocol.    PT Home Exercise Plan continue HEP provided by surgeon    Consulted and Agree with Plan of Care Patient;Family member/caregiver    Family Member Consulted Husband, Ronnie           Patient will benefit from skilled therapeutic intervention in order to improve the following deficits and impairments:  Abnormal gait, Decreased activity tolerance, Decreased balance, Decreased strength, Decreased mobility, Decreased range of motion, Difficulty walking, Increased edema, Pain  Visit Diagnosis: Acute pain of right knee  Stiffness of right knee, not elsewhere classified  Localized edema  Muscle weakness (generalized)     Problem List Patient Active Problem List   Diagnosis Date Noted  . OA (osteoarthritis) of knee 10/08/2020  . Primary osteoarthritis of right knee 10/08/2020  . Morbid obesity ([place]) 03/08/2015  . Vitamin D deficiency 03/08/2015  . COPD (chronic obstructive pulmonary disease) ([place]) 08/11/2014  . Fall 12/19/2013  . Hyperlipidemia 06/29/2013  . Hypertension 06/29/2013  . GAD (generalized anxiety disorder) 06/29/2013  . Diverticulosis of colon without hemorrhage 06/29/2013  . GERD (gastroesophageal reflux disease) 06/29/2013  . Insomnia 06/29/2013  . Osteopenia 06/29/2013    Standley Brooking, PTA 10/24/2020, 12:22 PM  [facility] [address] [place], [place], [address] Phone: [phone]   Fax:   [phone]  Name: Renee Maldonado MRN: [id number] Date of Birth: [birth year]

## 2020-10-26 ENCOUNTER — Encounter: Payer: Self-pay | Admitting: Physical Therapy

## 2020-10-26 ENCOUNTER — Other Ambulatory Visit: Payer: Self-pay

## 2020-10-26 ENCOUNTER — Ambulatory Visit: Payer: Medicare HMO | Admitting: Physical Therapy

## 2020-10-26 DIAGNOSIS — R600 Localized edema: Secondary | ICD-10-CM | POA: Diagnosis not present

## 2020-10-26 DIAGNOSIS — M25661 Stiffness of right knee, not elsewhere classified: Secondary | ICD-10-CM | POA: Diagnosis not present

## 2020-10-26 DIAGNOSIS — M25561 Pain in right knee: Secondary | ICD-10-CM

## 2020-10-26 DIAGNOSIS — M6281 Muscle weakness (generalized): Secondary | ICD-10-CM | POA: Diagnosis not present

## 2020-10-26 NOTE — Therapy (Signed)
[facility] [place], [place], [address] Phone: [phone]   Fax:  [phone]  Physical Therapy Treatment  Patient Details  Name: Renee Maldonado MRN: [id number] Date of Birth: 04-30-1946 Referring Provider (PT): Gaynelle Arabian, MD   Encounter Date: 10/26/2020   PT End of Session - 10/26/20 1120    Visit Number 6    Number of Visits 12    Date for PT Re-Evaluation 11/26/20    Authorization Type A[employer]na Medicare (CQ Modifier); FOTO, progress note every 10th visit    PT Start Time 1118    PT Stop Time 1208    PT Time Calculation (min) 50 min    Equipment Utilized During Treatment Other (comment)   FWW, SPC, SBQC   Activity Tolerance Patient tolerated treatment well    Behavior During Therapy [facility] for tasks assessed/performed           Past Medical History:  Diagnosis Date  . Anxiety   . Arthritis   . Cancer [facility])    breast Right no chemo or radiation  on Arimidex  . COPD (chronic obstructive pulmonary disease) ([place])    on Breo  . GERD (gastroesophageal reflux disease)   . Humerus fracture 2007  . Hyperlipidemia   . Hypertension   . Insomnia     Past Surgical History:  Procedure Laterality Date  . BREAST LUMPECTOMY Right 02/2019  . BREAST SURGERY Right 01/2019   breast biopsy  . JOINT REPLACEMENT     left knee  . Left shoulder surgery     2007  . TOTAL KNEE ARTHROPLASTY     2012 left  . TOTAL KNEE ARTHROPLASTY Right 10/08/2020   Procedure: TOTAL KNEE ARTHROPLASTY;  Surgeon: Gaynelle Arabian, MD;  Location: [facility];  Service: Orthopedics;  Laterality: Right;  . TUBAL LIGATION      There were no vitals filed for this visit.   Subjective Assessment - 10/26/20 1119    Subjective COVID-19 screening performed upon arrival. Patient reports that she woke up painfree today but just a little stiff.    Patient is accompained by: Family member   Husband, Ronnie   Pertinent History R TKA 10/08/2020; HTN, COPD,  osteopenia, history of L TKA    Limitations Sitting;Standing;Walking;House hold activities    Patient Stated Goals drive and run errands    Currently in Pain? No/denies              [facility] PT Assessment - 10/26/20 0001      Assessment   Medical Diagnosis Presence of right artificial knee joint    Referring Provider (PT) Gaynelle Arabian, MD    Onset Date/Surgical Date 10/08/20    Next MD Visit 11/14/2020    Prior Therapy not for R knee      Precautions   Precautions Other (comment)    Precaution Comments no ultrasound      Restrictions   Weight Bearing Restrictions No                         [facility] Adult PT Treatment/Exercise - 10/26/20 0001      Ambulation/Gait   Ambulation/Gait Yes    Ambulation/Gait Assistance 5: Supervision    Ambulation Distance (Feet) 200 Feet    Assistive device Straight cane;Small based quad cane    Gait Pattern Step-to pattern;Decreased arm swing - right;Decreased step length - right;Decreased stance time - right;Decreased stride length;Decreased hip/knee flexion - right;Decreased dorsiflexion - right;Antalgic;Narrow base of  support;Step-through pattern    Ambulation Surface Level;Indoor      Knee/Hip Exercises: Aerobic   Nustep L4, seat 8-7 x15 min      Knee/Hip Exercises: Standing   Forward Step Up Right;15 reps;Hand Hold: 2;Step Height: 6"    Step Down Right;10 reps;Hand Hold: 2;Step Height: 4"      Knee/Hip Exercises: Seated   Long Arc Quad Strengthening;Right;20 reps;Weights    Long Arc Quad Weight 3 lbs.      Modalities   Modalities Vasopneumatic      Vasopneumatic   Number Minutes Vasopneumatic  10 minutes    Vasopnuematic Location  Knee    Vasopneumatic Pressure Medium    Vasopneumatic Temperature  34 for pain and edema                       PT Long Term Goals - 10/22/20 1156      PT LONG TERM GOAL #1   Title Patient will be independent with HEP and its progression.    Time 6    Period Weeks     Status On-going      PT LONG TERM GOAL #2   Title Patient will demonstrate 5 degrees or less of right knee extension AROM to improve gait mechanics.    Time 6    Period Weeks    Status Achieved      PT LONG TERM GOAL #3   Title Patient will demonstrate 115+ degrees of right knee flexion AROM to improve functional tasks.    Time 6    Period Weeks    Status On-going      PT LONG TERM GOAL #4   Title Patient will demonstrate 4/5 right knee MMT to improve stability during functional tasks.    Time 6    Period Weeks    Status On-going      PT LONG TERM GOAL #5   Title Patient will report ability to perform ADLs and home activities with right knee pain less than or equal to 3/10.    Time 6    Period Weeks    Status On-going      PT LONG TERM GOAL #6   Title Patient will demonstrate reciprocating stair negotiation with one railing to safely enter and exit home.    Time 6    Period Weeks    Status On-going                 Plan - 10/26/20 1157    Clinical Impression Statement Patient presented in clinic with reports of painfree R knee upon waking. Patient did present with increased edema of R knee and into R foot as well. Patient reports sitting more this morning writing Christmas cards. Patient progressed today to ambulation with [facility] and SPC. Patient reported that only canes she had a home were SPC. Patient educated regarding gait pattern along with VCs and demo to increase R knee flexion along with heel strike/toe off. Patient able to tolerate progression well and able to stabilize fairly well. Patient reported some difficulty with step downs due to weakness and R knee flexion. Normal vasopneumatic response noted following removal of the modality. Patient instructed that if she felt confident in ambulation with [facility] then she could practice within her hallway at home. Patient also instructed to pull TED hose completely up and leave no wrinkles at TED hose was gathered at distal  tibia/fibula region.    Personal Factors and Comorbidities Age;Comorbidity 3+  Comorbidities R TKA 10/08/2020; HTN, COPD, osteopenia, history of L TKA    Examination-Activity Limitations Bathing;Locomotion Level;Transfers;Stand;Stairs;Dressing;Hygiene/Grooming;Sit;Bed Mobility    Examination-Participation Restrictions Driving;Meal Prep    Stability/Clinical Decision Making Stable/Uncomplicated    Rehab Potential Good    PT Frequency 3x / week    PT Duration 4 weeks    PT Treatment/Interventions ADLs/Self Care Home Management;Cryotherapy;Electrical Stimulation;Gait training;Stair training;Functional mobility training;Moist Heat;Therapeutic activities;Balance training;Therapeutic exercise;Neuromuscular re-education;Manual techniques;Passive range of motion;Patient/family education;Vasopneumatic Device;Taping    PT Next Visit Plan Continue with ROM and strengthening per protocol.    PT Home Exercise Plan continue HEP provided by surgeon    Consulted and Agree with Plan of Care Patient;Family member/caregiver    Family Member Consulted Husband, Ronnie           Patient will benefit from skilled therapeutic intervention in order to improve the following deficits and impairments:  Abnormal gait,Decreased activity tolerance,Decreased balance,Decreased strength,Decreased mobility,Decreased range of motion,Difficulty walking,Increased edema,Pain  Visit Diagnosis: Acute pain of right knee  Stiffness of right knee, not elsewhere classified  Localized edema  Muscle weakness (generalized)     Problem List Patient Active Problem List   Diagnosis Date Noted  . OA (osteoarthritis) of knee 10/08/2020  . Primary osteoarthritis of right knee 10/08/2020  . Morbid obesity ([place]) 03/08/2015  . Vitamin D deficiency 03/08/2015  . COPD (chronic obstructive pulmonary disease) ([place]) 08/11/2014  . Fall 12/19/2013  . Hyperlipidemia 06/29/2013  . Hypertension 06/29/2013  . GAD (generalized anxiety  disorder) 06/29/2013  . Diverticulosis of colon without hemorrhage 06/29/2013  . GERD (gastroesophageal reflux disease) 06/29/2013  . Insomnia 06/29/2013  . Osteopenia 06/29/2013    Standley Brooking, PTA 10/26/2020, 12:23 PM  [facility] [place], [place], [address] Phone: [phone]   Fax:  [phone]  Name: Renee Maldonado MRN: [id number] Date of Birth: Nov 19, 1945

## 2020-10-29 ENCOUNTER — Encounter: Payer: Self-pay | Admitting: Physical Therapy

## 2020-10-29 ENCOUNTER — Other Ambulatory Visit: Payer: Self-pay

## 2020-10-29 ENCOUNTER — Ambulatory Visit: Payer: Medicare HMO | Admitting: Physical Therapy

## 2020-10-29 DIAGNOSIS — M6281 Muscle weakness (generalized): Secondary | ICD-10-CM | POA: Diagnosis not present

## 2020-10-29 DIAGNOSIS — M25561 Pain in right knee: Secondary | ICD-10-CM

## 2020-10-29 DIAGNOSIS — M25661 Stiffness of right knee, not elsewhere classified: Secondary | ICD-10-CM | POA: Diagnosis not present

## 2020-10-29 DIAGNOSIS — R600 Localized edema: Secondary | ICD-10-CM

## 2020-10-29 NOTE — Therapy (Signed)
[facility] [place], [place], [address] Phone: [phone]   Fax:  [phone]  Physical Therapy Treatment  Patient Details  Name: Renee Maldonado MRN: [id number] Date of Birth: December 14, 1945 Referring Provider (PT): Gaynelle Arabian, MD   Encounter Date: 10/29/2020   PT End of Session - 10/29/20 1120    Visit Number 7    Number of Visits 12    Date for PT Re-Evaluation 11/26/20    Authorization Type Aetna Medicare (CQ Modifier); FOTO, progress note every 10th visit    PT Start Time 1118    PT Stop Time 1205    PT Time Calculation (min) 47 min    Equipment Utilized During Treatment Other (comment)   SPC   Activity Tolerance Patient tolerated treatment well    Behavior During Therapy [facility] for tasks assessed/performed           Past Medical History:  Diagnosis Date  . Anxiety   . Arthritis   . Cancer [facility])    breast Right no chemo or radiation  on Arimidex  . COPD (chronic obstructive pulmonary disease) ([place])    on Breo  . GERD (gastroesophageal reflux disease)   . Humerus fracture 2007  . Hyperlipidemia   . Hypertension   . Insomnia     Past Surgical History:  Procedure Laterality Date  . BREAST LUMPECTOMY Right 02/2019  . BREAST SURGERY Right 01/2019   breast biopsy  . JOINT REPLACEMENT     left knee  . Left shoulder surgery     2007  . TOTAL KNEE ARTHROPLASTY     2012 left  . TOTAL KNEE ARTHROPLASTY Right 10/08/2020   Procedure: TOTAL KNEE ARTHROPLASTY;  Surgeon: Gaynelle Arabian, MD;  Location[facility];  Service: Orthopedics;  Laterality: Right;  . TUBAL LIGATION      There were no vitals filed for this visit.   Subjective Assessment - 10/29/20 1119    Subjective COVID-19 screening performed upon arrival. Reports she feels weakness of RLE and has used the cane half the time at home due to weakness but other times FWW. Took pain pill for discomfort and soreness in anticipation of PT visit.    Patient is  accompained by: Family member   Husband, [name]   Pertinent History R TKA 10/08/2020; HTN, COPD, osteopenia, history of L TKA    Limitations Sitting;Standing;Walking;House hold activities    Patient Stated Goals drive and run errands    Currently in Pain? Yes    Pain Score 2     Pain Location Knee    Pain Orientation Right    Pain Descriptors / Indicators Tiring;Sore    Pain Type Surgical pain    Pain Onset 1 to 4 weeks ago    Pain Frequency Intermittent              [facility] PT Assessment - 10/29/20 0001      Assessment   Medical Diagnosis Presence of right artificial knee joint    Referring Provider (PT) Gaynelle Arabian, MD    Onset Date/Surgical Date 10/08/20    Next MD Visit 11/14/2020    Prior Therapy not for R knee      Precautions   Precautions Other (comment)    Precaution Comments no ultrasound      Restrictions   Weight Bearing Restrictions No                         [facility] Adult  PT Treatment/Exercise - 10/29/20 0001      Knee/Hip Exercises: Aerobic   Nustep L4, seat 7-5 x15 min for ROM      Knee/Hip Exercises: Standing   Forward Lunges Right;15 reps;3 seconds    Forward Lunges Limitations off 6" step    Forward Step Up Right;15 reps;Hand Hold: 2;Step Height: 6"    Rocker Board 3 minutes      Knee/Hip Exercises: Seated   Long Arc Quad Strengthening;Right;20 reps;Weights    Long Arc Quad Weight 2 lbs.    Sit to Sand 15 reps;without UE support      Modalities   Modalities Vasopneumatic      Vasopneumatic   Number Minutes Vasopneumatic  10 minutes    Vasopnuematic Location  Knee    Vasopneumatic Pressure Medium    Vasopneumatic Temperature  34 for pain and edema                       PT Long Term Goals - 10/22/20 1156      PT LONG TERM GOAL #1   Title Patient will be independent with HEP and its progression.    Time 6    Period Weeks    Status On-going      PT LONG TERM GOAL #2   Title Patient will demonstrate 5 degrees  or less of right knee extension AROM to improve gait mechanics.    Time 6    Period Weeks    Status Achieved      PT LONG TERM GOAL #3   Title Patient will demonstrate 115+ degrees of right knee flexion AROM to improve functional tasks.    Time 6    Period Weeks    Status On-going      PT LONG TERM GOAL #4   Title Patient will demonstrate 4/5 right knee MMT to improve stability during functional tasks.    Time 6    Period Weeks    Status On-going      PT LONG TERM GOAL #5   Title Patient will report ability to perform ADLs and home activities with right knee pain less than or equal to 3/10.    Time 6    Period Weeks    Status On-going      PT LONG TERM GOAL #6   Title Patient will demonstrate reciprocating stair negotiation with one railing to safely enter and exit home.    Time 6    Period Weeks    Status On-going                 Plan - 10/29/20 1158    Clinical Impression Statement Patient presented in clinic with reports of more LE weakness feeling and some minimal discomfort. Patient reports she has been splitting her ambulation time between [facility] and FWW due to fatigue of LEs. Patient continues to require VCs to avoid hip circumduction and increase WBing. Patient also presented with increased RLE edema into R foot. Patient encouraged to elevate and ice RLE as needed above for her heart throughout the day in order to reduce edema. Normal vasopneumatic response noted following removal of the modality.    Personal Factors and Comorbidities Age;Comorbidity 3+    Comorbidities R TKA 10/08/2020; HTN, COPD, osteopenia, history of L TKA    Examination-Activity Limitations Bathing;Locomotion Level;Transfers;Stand;Stairs;Dressing;Hygiene/Grooming;Sit;Bed Mobility    Examination-Participation Restrictions Driving;Meal Prep    Stability/Clinical Decision Making Stable/Uncomplicated    Rehab Potential Good    PT Frequency  3x / week    PT Duration 4 weeks    PT  Treatment/Interventions ADLs/Self Care Home Management;Cryotherapy;Electrical Stimulation;Gait training;Stair training;Functional mobility training;Moist Heat;Therapeutic activities;Balance training;Therapeutic exercise;Neuromuscular re-education;Manual techniques;Passive range of motion;Patient/family education;Vasopneumatic Device;Taping    PT Next Visit Plan Continue with ROM and strengthening per protocol.    PT Home Exercise Plan continue HEP provided by surgeon    Consulted and Agree with Plan of Care Patient;Family member/caregiver    Family Member Consulted Husband, Ronnie           Patient will benefit from skilled therapeutic intervention in order to improve the following deficits and impairments:  Abnormal gait,Decreased activity tolerance,Decreased balance,Decreased strength,Decreased mobility,Decreased range of motion,Difficulty walking,Increased edema,Pain  Visit Diagnosis: Acute pain of right knee  Stiffness of right knee, not elsewhere classified  Localized edema  Muscle weakness (generalized)     Problem List Patient Active Problem List   Diagnosis Date Noted  . OA (osteoarthritis) of knee 10/08/2020  . Primary osteoarthritis of right knee 10/08/2020  . Morbid obesity ([place]) 03/08/2015  . Vitamin D deficiency 03/08/2015  . COPD (chronic obstructive pulmonary disease) ([place]) 08/11/2014  . Fall 12/19/2013  . Hyperlipidemia 06/29/2013  . Hypertension 06/29/2013  . GAD (generalized anxiety disorder) 06/29/2013  . Diverticulosis of colon without hemorrhage 06/29/2013  . GERD (gastroesophageal reflux disease) 06/29/2013  . Insomnia 06/29/2013  . Osteopenia 06/29/2013    Standley Brooking, PTA 10/29/2020, 12:09 PM  [facility] [address] [place], [place], [address] Phone: [phone]   Fax:  [phone]  Name: Renee Maldonado MRN: [id number] Date of Birth: 1946-02-22

## 2020-10-31 ENCOUNTER — Other Ambulatory Visit: Payer: Self-pay

## 2020-10-31 ENCOUNTER — Encounter: Payer: Self-pay | Admitting: Physical Therapy

## 2020-10-31 ENCOUNTER — Ambulatory Visit: Payer: Medicare HMO | Admitting: Physical Therapy

## 2020-10-31 DIAGNOSIS — R600 Localized edema: Secondary | ICD-10-CM

## 2020-10-31 DIAGNOSIS — M6281 Muscle weakness (generalized): Secondary | ICD-10-CM | POA: Diagnosis not present

## 2020-10-31 DIAGNOSIS — M25561 Pain in right knee: Secondary | ICD-10-CM | POA: Diagnosis not present

## 2020-10-31 DIAGNOSIS — M25661 Stiffness of right knee, not elsewhere classified: Secondary | ICD-10-CM

## 2020-10-31 NOTE — Therapy (Signed)
[facility] [place], [place], [address] Phone: [phone]   Fax:  [phone]  Physical Therapy Treatment  Patient Details  Name: Renee Maldonado MRN: [id number] Date of Birth: [birth year] Referring Provider (PT): Gaynelle Arabian, MD   Encounter Date: 10/31/2020   PT End of Session - 10/31/20 1147    Visit Number 8    Number of Visits 12    Date for PT Re-Evaluation 11/26/20    Authorization Type Aetna Medicare (CQ Modifier); FOTO, progress note every 10th visit    PT Start Time 1116    PT Stop Time 1202    PT Time Calculation (min) 46 min    Equipment Utilized During Treatment Other (comment)   SPC   Activity Tolerance Patient tolerated treatment well    Behavior During Therapy [facility] for tasks assessed/performed           Past Medical History:  Diagnosis Date  . Anxiety   . Arthritis   . Cancer [facility])    breast Right no chemo or radiation  on Arimidex  . COPD (chronic obstructive pulmonary disease) ([place])    on Breo  . GERD (gastroesophageal reflux disease)   . Humerus fracture 2007  . Hyperlipidemia   . Hypertension   . Insomnia     Past Surgical History:  Procedure Laterality Date  . BREAST LUMPECTOMY Right 02/2019  . BREAST SURGERY Right 01/2019   breast biopsy  . JOINT REPLACEMENT     left knee  . Left shoulder surgery     2007  . TOTAL KNEE ARTHROPLASTY     2012 left  . TOTAL KNEE ARTHROPLASTY Right 10/08/2020   Procedure: TOTAL KNEE ARTHROPLASTY;  Surgeon: Gaynelle Arabian, MD;  [facility];  Service: Orthopedics;  Laterality: Right;  . TUBAL LIGATION      There were no vitals filed for this visit.   Subjective Assessment - 10/31/20 1128    Subjective COVID 19 screening performed on patient upon arrival. Has been doing HEP as instructed. Has been using Tramadol primiarily but trying to save it.    Patient is accompained by: Family member   Husband, [name]   Pertinent History R TKA 10/08/2020;  HTN, COPD, osteopenia, history of L TKA    Limitations Sitting;Standing;Walking;House hold activities    Patient Stated Goals drive and run errands    Currently in Pain? Yes    Pain Score --   No pain score provided   Pain Location Knee    Pain Orientation Right    Pain Descriptors / Indicators Discomfort    Pain Type Surgical pain    Pain Onset 1 to 4 weeks ago    Pain Frequency Intermittent              [facility] PT Assessment - 10/31/20 0001      Assessment   Medical Diagnosis Presence of right artificial knee joint    Referring Provider (PT) Gaynelle Arabian, MD    Onset Date/Surgical Date 10/08/20    Next MD Visit 11/14/2020    Prior Therapy not for R knee      Precautions   Precautions Other (comment)    Precaution Comments no ultrasound      Restrictions   Weight Bearing Restrictions No                         [facility] Adult PT Treatment/Exercise - 10/31/20 0001      Knee/Hip Exercises:  Aerobic   Nustep L2, seat 6-5 x15 min      Knee/Hip Exercises: Standing   Forward Lunges Right;15 reps;3 seconds    Forward Lunges Limitations off 8" step    Terminal Knee Extension Strengthening;Right;15 reps;Theraband    Theraband Level (Terminal Knee Extension) Level 2 (Red)    Forward Step Up Right;Hand Hold: 2;Step Height: 6";20 reps    Step Down Right;20 reps;Hand Hold: 2;Step Height: 4"    Rocker Board 2 minutes      Knee/Hip Exercises: Seated   Long Arc Quad Strengthening;Right;20 reps;Weights    Long Arc Quad Weight 4 lbs.      Modalities   Modalities Vasopneumatic      Vasopneumatic   Number Minutes Vasopneumatic  10 minutes    Vasopnuematic Location  Knee    Vasopneumatic Pressure Medium    Vasopneumatic Temperature  34 for pain and edema                       PT Long Term Goals - 10/31/20 1201      PT LONG TERM GOAL #1   Title Patient will be independent with HEP and its progression.    Time 6    Period Weeks    Status On-going       PT LONG TERM GOAL #2   Title Patient will demonstrate 5 degrees or less of right knee extension AROM to improve gait mechanics.    Time 6    Period Weeks    Status Achieved      PT LONG TERM GOAL #3   Title Patient will demonstrate 115+ degrees of right knee flexion AROM to improve functional tasks.    Time 6    Period Weeks    Status On-going      PT LONG TERM GOAL #4   Title Patient will demonstrate 4/5 right knee MMT to improve stability during functional tasks.    Time 6    Period Weeks    Status On-going      PT LONG TERM GOAL #5   Title Patient will report ability to perform ADLs and home activities with right knee pain less than or equal to 3/10.    Time 6    Period Weeks    Status Achieved      PT LONG TERM GOAL #6   Title Patient will demonstrate reciprocating stair negotiation with one railing to safely enter and exit home.    Time 6    Period Weeks    Status On-going                 Plan - 10/31/20 1155    Clinical Impression Statement Patient presented in clinic with reports of soreness of R knee. Patient progressed to more advanced strengthening with only reports of more soreness of R knee. Patient observed ambulating WNL with SPC within clinic although patient reports she is still weary of SPC at home. Patient has ordered LE elevation pillow for elevation use at home. Patient required multimodal cueing to correct TKE technique. Normal vasopnuematic response noted following removal of the modality.    Personal Factors and Comorbidities Age;Comorbidity 3+    Comorbidities R TKA 10/08/2020; HTN, COPD, osteopenia, history of L TKA    Examination-Activity Limitations Bathing;Locomotion Level;Transfers;Stand;Stairs;Dressing;Hygiene/Grooming;Sit;Bed Mobility    Examination-Participation Restrictions Driving;Meal Prep    Stability/Clinical Decision Making Stable/Uncomplicated    Rehab Potential Good    PT Frequency 3x / week  PT Duration 4 weeks    PT  Treatment/Interventions ADLs/Self Care Home Management;Cryotherapy;Electrical Stimulation;Gait training;Stair training;Functional mobility training;Moist Heat;Therapeutic activities;Balance training;Therapeutic exercise;Neuromuscular re-education;Manual techniques;Passive range of motion;Patient/family education;Vasopneumatic Device;Taping    PT Next Visit Plan Continue with ROM and strengthening per protocol. Assess R knee ROM.    PT Home Exercise Plan continue HEP provided by surgeon    Consulted and Agree with Plan of Care Patient;Family member/caregiver    Family Member Consulted Husband, Ronnie           Patient will benefit from skilled therapeutic intervention in order to improve the following deficits and impairments:  Abnormal gait,Decreased activity tolerance,Decreased balance,Decreased strength,Decreased mobility,Decreased range of motion,Difficulty walking,Increased edema,Pain  Visit Diagnosis: Acute pain of right knee  Stiffness of right knee, not elsewhere classified  Localized edema  Muscle weakness (generalized)     Problem List Patient Active Problem List   Diagnosis Date Noted  . OA (osteoarthritis) of knee 10/08/2020  . Primary osteoarthritis of right knee 10/08/2020  . Morbid obesity ([place]) 03/08/2015  . Vitamin D deficiency 03/08/2015  . COPD (chronic obstructive pulmonary disease) ([place]) 08/11/2014  . Fall 12/19/2013  . Hyperlipidemia 06/29/2013  . Hypertension 06/29/2013  . GAD (generalized anxiety disorder) 06/29/2013  . Diverticulosis of colon without hemorrhage 06/29/2013  . GERD (gastroesophageal reflux disease) 06/29/2013  . Insomnia 06/29/2013  . Osteopenia 06/29/2013    Standley Brooking, PTA 10/31/2020, 12:08 PM  [facility] [address] [place], [place], [address] Phone: [phone]   Fax:  [phone]  Name: Renee Maldonado MRN: [id number] Date of Birth: 11/15/1946

## 2020-11-02 ENCOUNTER — Encounter: Payer: Self-pay | Admitting: Physical Therapy

## 2020-11-02 ENCOUNTER — Ambulatory Visit: Payer: Medicare HMO | Admitting: Physical Therapy

## 2020-11-02 ENCOUNTER — Other Ambulatory Visit: Payer: Self-pay

## 2020-11-02 DIAGNOSIS — M25561 Pain in right knee: Secondary | ICD-10-CM | POA: Diagnosis not present

## 2020-11-02 DIAGNOSIS — R600 Localized edema: Secondary | ICD-10-CM

## 2020-11-02 DIAGNOSIS — M25661 Stiffness of right knee, not elsewhere classified: Secondary | ICD-10-CM

## 2020-11-02 DIAGNOSIS — M6281 Muscle weakness (generalized): Secondary | ICD-10-CM | POA: Diagnosis not present

## 2020-11-02 NOTE — Therapy (Addendum)
[facility] [place], [place], [address] Phone: [phone]   Fax:  [phone]  Physical Therapy Treatment  Patient Details  Name: Renee Maldonado MRN: [id number] Date of Birth: [birth year]  Referring Provider (PT): Gaynelle Arabian, MD   Encounter Date: 11/02/2020   PT End of Session - 11/02/20 1132    Visit Number 9    Number of Visits 12    Date for PT Re-Evaluation 11/26/20    Authorization Type Aetna Medicare ([place] Modifier); FOTO, progress note every 10th visit    PT Start Time 1115    PT Stop Time 1205    PT Time Calculation (min) 50 min    Equipment Utilized During Treatment Other (comment)   SPC   Activity Tolerance Patient tolerated treatment well    Behavior During Therapy [facility] for tasks assessed/performed           Past Medical History:  Diagnosis Date  . Anxiety   . Arthritis   . Cancer [facility])    breast Right no chemo or radiation  on Arimidex  . COPD (chronic obstructive pulmonary disease) ([place])    on Breo  . GERD (gastroesophageal reflux disease)   . Humerus fracture 2007  . Hyperlipidemia   . Hypertension   . Insomnia     Past Surgical History:  Procedure Laterality Date  . BREAST LUMPECTOMY Right 02/2019  . BREAST SURGERY Right 01/2019   breast biopsy  . JOINT REPLACEMENT     left knee  . Left shoulder surgery     2007  . TOTAL KNEE ARTHROPLASTY     2012 left  . TOTAL KNEE ARTHROPLASTY Right 10/08/2020   Procedure: TOTAL KNEE ARTHROPLASTY;  Surgeon: Gaynelle Arabian, MD;  Location: [facility];  Service: Orthopedics;  Laterality: Right;  . TUBAL LIGATION      There were no vitals filed for this visit.   Subjective Assessment - 11/02/20 1127    Subjective COVID 19 screening performed on patient upon arrival. Reported doing well but did not perform HEP this morning as her daughter graduated and wanted to watch the graduation.    Patient is accompained by: Family member    Pertinent History R TKA  10/08/2020; HTN, COPD, osteopenia, history of L TKA    Limitations Sitting;Standing;Walking;House hold activities    Patient Stated Goals drive and run errands    Currently in Pain? Yes    Pain Score 4     Pain Location Knee    Pain Orientation Right    Pain Descriptors / Indicators Discomfort;Aching    Pain Type Surgical pain    Pain Onset 1 to 4 weeks ago    Pain Frequency Intermittent              [facility] PT Assessment - 11/02/20 0001      Assessment   Medical Diagnosis Presence of right artificial knee joint    Referring Provider (PT) Gaynelle Arabian, MD    Onset Date/Surgical Date 10/08/20    Next MD Visit 11/14/2020    Prior Therapy not for R knee      Precautions   Precautions Other (comment)    Precaution Comments no ultrasound      AROM   Right Knee Extension 5    Right Knee Flexion 110      PROM   Right Knee Extension 3    Right Knee Flexion 115  [facility] Adult PT Treatment/Exercise - 11/02/20 0001      Knee/Hip Exercises: Aerobic   Nustep L3, seat 6-5 x15 min      Knee/Hip Exercises: Standing   Heel Raises Both;2 sets;10 reps    Heel Raises Limitations B toe raises x20    Lateral Step Up Right;2 sets;10 reps;Hand Hold: 2;Step Height: 6"    Forward Step Up Right;Hand Hold: 2;Step Height: 6";20 reps    Rocker Board 3 minutes      Modalities   Modalities Vasopneumatic      Vasopneumatic   Number Minutes Vasopneumatic  10 minutes    Vasopnuematic Location  Knee    Vasopneumatic Pressure Medium    Vasopneumatic Temperature  34 for pain and edema      Manual Therapy   Manual Therapy Passive ROM    Passive ROM PROM to right knee into knee flexion and extension to improve ROM                  PT Education - 11/02/20 1238    Education Details marching, heel raises, toe raises, step ups    Person(s) Educated Patient    Methods Explanation;Demonstration;Handout    Comprehension Verbalized  understanding;Returned demonstration               PT Long Term Goals - 11/02/20 1220      PT LONG TERM GOAL #1   Title Patient will be independent with HEP and its progression.    Time 6    Period Weeks    Status On-going      PT LONG TERM GOAL #2   Title Patient will demonstrate 5 degrees or less of right knee extension AROM to improve gait mechanics.    Time 6    Period Weeks    Status Achieved      PT LONG TERM GOAL #3   Title Patient will demonstrate 115+ degrees of right knee flexion AROM to improve functional tasks.    Time 6    Period Weeks    Status On-going      PT LONG TERM GOAL #4   Title Patient will demonstrate 4/5 right knee MMT to improve stability during functional tasks.    Time 6    Period Weeks    Status On-going      PT LONG TERM GOAL #5   Title Patient will report ability to perform ADLs and home activities with right knee pain less than or equal to 3/10.    Time 6    Period Weeks    Status Achieved      PT LONG TERM GOAL #6   Title Patient will demonstrate reciprocating stair negotiation with one railing to safely enter and exit home.    Time 6    Period Weeks    Status On-going                 Plan - 11/02/20 1136    Clinical Impression Statement Patient arrived to physical therapy doing fairly well. Patient guided through standing TEs with demonstration and explanation. Excellent technique of all TEs. Patient provided with updated HEP of standing marching, heel/toe raises, and step up. Patient instructed to perform all at the kitchen counter for safety rather than at a chair like shown on the handout. Patient reported understanding. R knee AROM measured at 5-110. R knee PROM measured at 3-115 degrees. Normal response to modalities upon removal.    Personal Factors and Comorbidities Age;Comorbidity 3+  Comorbidities R TKA 10/08/2020; HTN, COPD, osteopenia, history of L TKA    Examination-Activity Limitations Bathing;Locomotion  Level;Transfers;Stand;Stairs;Dressing;Hygiene/Grooming;Sit;Bed Mobility    Examination-Participation Restrictions Driving;Meal Prep    Stability/Clinical Decision Making Stable/Uncomplicated    Clinical Decision Making Low    Rehab Potential Good    PT Frequency 3x / week    PT Duration 4 weeks    PT Treatment/Interventions ADLs/Self Care Home Management;Cryotherapy;Electrical Stimulation;Gait training;Stair training;Functional mobility training;Moist Heat;Therapeutic activities;Balance training;Therapeutic exercise;Neuromuscular re-education;Manual techniques;Passive range of motion;Patient/family education;Vasopneumatic Device;Taping    PT Next Visit Plan FOTO; attempt bike if tolerable; Continue with ROM and strengthening; modalities PRN for pain relief.    PT Home Exercise Plan continue HEP provided by surgeon    Consulted and Agree with Plan of Care Patient;Family member/caregiver    Family Member Consulted Husband, Ronnie           Patient will benefit from skilled therapeutic intervention in order to improve the following deficits and impairments:  Abnormal gait,Decreased activity tolerance,Decreased balance,Decreased strength,Decreased mobility,Decreased range of motion,Difficulty walking,Increased edema,Pain  Visit Diagnosis: Acute pain of right knee  Stiffness of right knee, not elsewhere classified  Localized edema  Muscle weakness (generalized)     Problem List Patient Active Problem List   Diagnosis Date Noted  . OA (osteoarthritis) of knee 10/08/2020  . Primary osteoarthritis of right knee 10/08/2020  . Morbid obesity ([place]) 03/08/2015  . Vitamin D deficiency 03/08/2015  . COPD (chronic obstructive pulmonary disease) ([place]) 08/11/2014  . Fall 12/19/2013  . Hyperlipidemia 06/29/2013  . Hypertension 06/29/2013  . GAD (generalized anxiety disorder) 06/29/2013  . Diverticulosis of colon without hemorrhage 06/29/2013  . GERD (gastroesophageal reflux disease)  06/29/2013  . Insomnia 06/29/2013  . Osteopenia 06/29/2013    Gabriela Eves, PT, DPT 11/02/2020, 12:40 PM  [facility] [address] [place], [place], [address] Phone: [phone]   Fax:  [phone]  Name: Renee Maldonado MRN: [id number] Date of Birth: Dec 03, 1945

## 2020-11-05 ENCOUNTER — Ambulatory Visit: Payer: Medicare HMO | Admitting: Physical Therapy

## 2020-11-05 ENCOUNTER — Other Ambulatory Visit: Payer: Self-pay

## 2020-11-05 DIAGNOSIS — M25561 Pain in right knee: Secondary | ICD-10-CM | POA: Diagnosis not present

## 2020-11-05 DIAGNOSIS — M25661 Stiffness of right knee, not elsewhere classified: Secondary | ICD-10-CM | POA: Diagnosis not present

## 2020-11-05 DIAGNOSIS — M6281 Muscle weakness (generalized): Secondary | ICD-10-CM | POA: Diagnosis not present

## 2020-11-05 DIAGNOSIS — R600 Localized edema: Secondary | ICD-10-CM

## 2020-11-05 NOTE — Therapy (Signed)
[facility] [place], [place], [address] Phone: [phone]   Fax:  [phone]  Physical Therapy Treatment Progress Note Reporting Period 10/15/2020 to 11/05/2020  See note below for Objective Data and Assessment of Progress/Goals.  Patient responding well with improvements in ROM and function. Gabriela Eves, PT, DPT     Patient Details  Name: Renee Maldonado MRN: [id number] Date of Birth: Apr 16, 1946 Referring Provider (PT): Gaynelle Arabian, MD   Encounter Date: 11/05/2020   PT End of Session - 11/05/20 1131    Visit Number 10    Number of Visits 12    Date for PT Re-Evaluation 11/26/20    Authorization Type Aetna Medicare ([place] Modifier); FOTO 10th visit 51%, progress note every 10th visit    PT Start Time 1115    PT Stop Time 1204    PT Time Calculation (min) 49 min    Activity Tolerance Patient tolerated treatment well    Behavior During Therapy WFL for tasks assessed/performed           Past Medical History:  Diagnosis Date  . Anxiety   . Arthritis   . Cancer [facility])    breast Right no chemo or radiation  on Arimidex  . COPD (chronic obstructive pulmonary disease) ([place])    on Breo  . GERD (gastroesophageal reflux disease)   . Humerus fracture 2007  . Hyperlipidemia   . Hypertension   . Insomnia     Past Surgical History:  Procedure Laterality Date  . BREAST LUMPECTOMY Right 02/2019  . BREAST SURGERY Right 01/2019   breast biopsy  . JOINT REPLACEMENT     left knee  . Left shoulder surgery     2007  . TOTAL KNEE ARTHROPLASTY     2012 left  . TOTAL KNEE ARTHROPLASTY Right 10/08/2020   Procedure: TOTAL KNEE ARTHROPLASTY;  Surgeon: Gaynelle Arabian, MD;  [facility];  Service: Orthopedics;  Laterality: Right;  . TUBAL LIGATION      There were no vitals filed for this visit.   Subjective Assessment - 11/05/20 1129    Subjective COVID 19 screening performed on patient upon arrival. Patient  arrived with some ongoing pain in knee    Patient is accompained by: Family member    Pertinent History R TKA 10/08/2020; HTN, COPD, osteopenia, history of L TKA    Limitations Sitting;Standing;Walking;House hold activities    Patient Stated Goals drive and run errands    Currently in Pain? Yes    Pain Score 4     Pain Location Knee    Pain Orientation Right    Pain Descriptors / Indicators Discomfort    Pain Type Surgical pain    Pain Onset 1 to 4 weeks ago    Pain Frequency Intermittent    Aggravating Factors  prolong movement activity    Pain Relieving Factors rest/meds              [facility] PT Assessment - 11/05/20 0001      AROM   AROM Assessment Site Knee    Right/Left Knee Right    Right Knee Extension -5    Right Knee Flexion 111      PROM   PROM Assessment Site Knee    Right/Left Knee Right    Right Knee Extension -2    Right Knee Flexion 117  [place] Adult PT Treatment/Exercise - 11/05/20 0001      Knee/Hip Exercises: Aerobic   Nustep L3, seat 6-5 x15 min      Knee/Hip Exercises: Standing   Heel Raises Both;2 sets;10 reps    Lateral Step Up Right;2 sets;10 reps;Hand Hold: 2;Step Height: 6"    Forward Step Up Right;Hand Hold: 2;Step Height: 6";20 reps    Rocker Board 3 minutes      Vasopneumatic   Number Minutes Vasopneumatic  10 minutes    Vasopnuematic Location  Knee    Vasopneumatic Pressure Medium    Vasopneumatic Temperature  34 for pain and edema      Manual Therapy   Manual Therapy Passive ROM    Passive ROM PROM to right knee into knee flexion and extension to improve ROM                       PT Long Term Goals - 11/05/20 1148      PT LONG TERM GOAL #1   Title Patient will be independent with HEP and its progression.    Time 6    Period Weeks    Status On-going      PT LONG TERM GOAL #2   Title Patient will demonstrate 5 degrees or less of right knee extension AROM to improve gait  mechanics.    Time 6    Period Weeks    Status Achieved      PT LONG TERM GOAL #3   Title Patient will demonstrate 115+ degrees of right knee flexion AROM to improve functional tasks.    Baseline AROM 111 degrees 11/05/20    Time 6    Period Weeks    Status On-going      PT LONG TERM GOAL #4   Title Patient will demonstrate 4/5 right knee MMT to improve stability during functional tasks.    Time 6    Period Weeks    Status On-going      PT LONG TERM GOAL #5   Title Patient will report ability to perform ADLs and home activities with right knee pain less than or equal to 3/10.    Time 6    Period Weeks    Status Achieved      PT LONG TERM GOAL #6   Title Patient will demonstrate reciprocating stair negotiation with one railing to safely enter and exit home.    Time 6    Period Weeks    Status On-going                 Plan - 11/05/20 1156    Clinical Impression Statement Patient tolerated treatment well today. Patient has continued to improve with ROM in right knee and progressing with strengtheing. Patient is doing HEP as able and doing well with elevation and swelling. Patient is progressing toward goals this week.    Personal Factors and Comorbidities Age;Comorbidity 3+    Comorbidities R TKA 10/08/2020; HTN, COPD, osteopenia, history of L TKA    Examination-Activity Limitations Bathing;Locomotion Level;Transfers;Stand;Stairs;Dressing;Hygiene/Grooming;Sit;Bed Mobility    Examination-Participation Restrictions Driving;Meal Prep    Stability/Clinical Decision Making Stable/Uncomplicated    Rehab Potential Good    PT Frequency 3x / week    PT Duration 4 weeks    PT Treatment/Interventions ADLs/Self Care Home Management;Cryotherapy;Electrical Stimulation;Gait training;Stair training;Functional mobility training;Moist Heat;Therapeutic activities;Balance training;Therapeutic exercise;Neuromuscular re-education;Manual techniques;Passive range of motion;Patient/family  education;Vasopneumatic Device;Taping    PT Next Visit Plan cont with POC and  attempt bike if tolerable; Continue with ROM and strengthening; modalities PRN for pain relief.    Consulted and Agree with Plan of Care Patient;Family member/caregiver           Patient will benefit from skilled therapeutic intervention in order to improve the following deficits and impairments:  Abnormal gait,Decreased activity tolerance,Decreased balance,Decreased strength,Decreased mobility,Decreased range of motion,Difficulty walking,Increased edema,Pain  Visit Diagnosis: Acute pain of right knee  Stiffness of right knee, not elsewhere classified  Localized edema  Muscle weakness (generalized)     Problem List Patient Active Problem List   Diagnosis Date Noted  . OA (osteoarthritis) of knee 10/08/2020  . Primary osteoarthritis of right knee 10/08/2020  . Morbid obesity ([place]) 03/08/2015  . Vitamin D deficiency 03/08/2015  . COPD (chronic obstructive pulmonary disease) ([place]) 08/11/2014  . Fall 12/19/2013  . Hyperlipidemia 06/29/2013  . Hypertension 06/29/2013  . GAD (generalized anxiety disorder) 06/29/2013  . Diverticulosis of colon without hemorrhage 06/29/2013  . GERD (gastroesophageal reflux disease) 06/29/2013  . Insomnia 06/29/2013  . Osteopenia 06/29/2013    Ladean Raya, PTA 11/05/20 12:06 PM  [facility] [place], [place], [address] Phone: [phone]   Fax:  [phone]  Name: Shylee Durrett MRN: [id number] Date of Birth: 1946/03/19

## 2020-11-07 ENCOUNTER — Other Ambulatory Visit: Payer: Self-pay

## 2020-11-07 ENCOUNTER — Ambulatory Visit: Payer: Medicare HMO | Admitting: Physical Therapy

## 2020-11-07 DIAGNOSIS — M25661 Stiffness of right knee, not elsewhere classified: Secondary | ICD-10-CM

## 2020-11-07 DIAGNOSIS — M25561 Pain in right knee: Secondary | ICD-10-CM | POA: Diagnosis not present

## 2020-11-07 DIAGNOSIS — R600 Localized edema: Secondary | ICD-10-CM | POA: Diagnosis not present

## 2020-11-07 DIAGNOSIS — M6281 Muscle weakness (generalized): Secondary | ICD-10-CM | POA: Diagnosis not present

## 2020-11-07 NOTE — Therapy (Signed)
[facility] [place], [place], [address] Phone: [phone]   Fax:  [phone]  Physical Therapy Treatment  Patient Details  Name: Renee Maldonado MRN: [id number] Date of Birth: [birth year] Referring Provider (PT): Gaynelle Arabian, MD   Encounter Date: 11/07/2020   PT End of Session - 11/07/20 1230    Visit Number 11    Number of Visits 18    Date for PT Re-Evaluation 12/14/20    Authorization Type Aetna Medicare ([place] Modifier); FOTO 10th visit 51%, progress note every 10th visit    PT Start Time 1115    PT Stop Time 1205    PT Time Calculation (min) 50 min    Activity Tolerance Patient tolerated treatment well    Behavior During Therapy WFL for tasks assessed/performed           Past Medical History:  Diagnosis Date  . Anxiety   . Arthritis   . Cancer [facility])    breast Right no chemo or radiation  on Arimidex  . COPD (chronic obstructive pulmonary disease) ([place])    on Breo  . GERD (gastroesophageal reflux disease)   . Humerus fracture 2007  . Hyperlipidemia   . Hypertension   . Insomnia     Past Surgical History:  Procedure Laterality Date  . BREAST LUMPECTOMY Right 02/2019  . BREAST SURGERY Right 01/2019   breast biopsy  . JOINT REPLACEMENT     left knee  . Left shoulder surgery     2007  . TOTAL KNEE ARTHROPLASTY     2012 left  . TOTAL KNEE ARTHROPLASTY Right 10/08/2020   Procedure: TOTAL KNEE ARTHROPLASTY;  Surgeon: Gaynelle Arabian, MD;  [facility];  Service: Orthopedics;  Laterality: Right;  . TUBAL LIGATION      There were no vitals filed for this visit.   Subjective Assessment - 11/07/20 1225    Subjective COVID-19 screen performed prior to patient entering clinic.  No new complaints.    Patient is accompained by: Family member    Pertinent History R TKA 10/08/2020; HTN, COPD, osteopenia, history of L TKA    Limitations Sitting;Standing;Walking;House hold activities    Patient Stated Goals  drive and run errands    Currently in Pain? Yes    Pain Score 4     Pain Location Knee    Pain Orientation Right    Pain Descriptors / Indicators Discomfort    Pain Onset 1 to 4 weeks ago                             [facility] Adult PT Treatment/Exercise - 11/07/20 0001      Exercises   Exercises Knee/Hip      Knee/Hip Exercises: Aerobic   Stationary Bike Level 1 x 5 minutes.    Nustep Level 3 x 10 minutes.      Knee/Hip Exercises: Machines for Strengthening   Cybex Knee Extension 10# x 3 minutes.    Cybex Knee Flexion 30# x 3 minutes.    Cybex Leg Press 1.5 plates x 3 minutes.      Modalities   Modalities Vasopneumatic      Vasopneumatic   Number Minutes Vasopneumatic  15 minutes    Vasopnuematic Location  --   Right knee.   Vasopneumatic Pressure Medium                       PT Long  Term Goals - 11/05/20 1148      PT LONG TERM GOAL #1   Title Patient will be independent with HEP and its progression.    Time 6    Period Weeks    Status On-going      PT LONG TERM GOAL #2   Title Patient will demonstrate 5 degrees or less of right knee extension AROM to improve gait mechanics.    Time 6    Period Weeks    Status Achieved      PT LONG TERM GOAL #3   Title Patient will demonstrate 115+ degrees of right knee flexion AROM to improve functional tasks.    Baseline AROM 111 degrees 11/05/20    Time 6    Period Weeks    Status On-going      PT LONG TERM GOAL #4   Title Patient will demonstrate 4/5 right knee MMT to improve stability during functional tasks.    Time 6    Period Weeks    Status On-going      PT LONG TERM GOAL #5   Title Patient will report ability to perform ADLs and home activities with right knee pain less than or equal to 3/10.    Time 6    Period Weeks    Status Achieved      PT LONG TERM GOAL #6   Title Patient will demonstrate reciprocating stair negotiation with one railing to safely enter and exit home.     Time 6    Period Weeks    Status On-going                 Plan - 11/07/20 1229    Clinical Impression Statement The patient did great today with progression to bike and weight machines with excellent technique.    Personal Factors and Comorbidities Age;Comorbidity 3+    Comorbidities R TKA 10/08/2020; HTN, COPD, osteopenia, history of L TKA    Examination-Activity Limitations Bathing;Locomotion Level;Transfers;Stand;Stairs;Dressing;Hygiene/Grooming;Sit;Bed Mobility    Examination-Participation Restrictions Driving;Meal Prep    Stability/Clinical Decision Making Stable/Uncomplicated    Rehab Potential Good    PT Frequency 3x / week    PT Duration 4 weeks    PT Treatment/Interventions ADLs/Self Care Home Management;Cryotherapy;Electrical Stimulation;Gait training;Stair training;Functional mobility training;Moist Heat;Therapeutic activities;Balance training;Therapeutic exercise;Neuromuscular re-education;Manual techniques;Passive range of motion;Patient/family education;Vasopneumatic Device;Taping    PT Next Visit Plan cont with POC and  attempt bike if tolerable; Continue with ROM and strengthening; modalities PRN for pain relief.    PT Home Exercise Plan Progressive to stationary bike.           Patient will benefit from skilled therapeutic intervention in order to improve the following deficits and impairments:  Abnormal gait,Decreased activity tolerance,Decreased balance,Decreased strength,Decreased mobility,Decreased range of motion,Difficulty walking,Increased edema,Pain  Visit Diagnosis: Acute pain of right knee  Stiffness of right knee, not elsewhere classified  Localized edema  Muscle weakness (generalized)     Problem List Patient Active Problem List   Diagnosis Date Noted  . OA (osteoarthritis) of knee 10/08/2020  . Primary osteoarthritis of right knee 10/08/2020  . Morbid obesity ([place]) 03/08/2015  . Vitamin D deficiency 03/08/2015  . COPD (chronic  obstructive pulmonary disease) ([place]) 08/11/2014  . Fall 12/19/2013  . Hyperlipidemia 06/29/2013  . Hypertension 06/29/2013  . GAD (generalized anxiety disorder) 06/29/2013  . Diverticulosis of colon without hemorrhage 06/29/2013  . GERD (gastroesophageal reflux disease) 06/29/2013  . Insomnia 06/29/2013  . Osteopenia 06/29/2013    Renee Maldonado, [place] MPT  11/07/2020, 12:31 PM  [facility] [place], [place], [address] Phone: [phone]   Fax:  [phone]  Name: Renee Maldonado MRN: [id number] Date of Birth: 09-Jan-1946

## 2020-11-12 ENCOUNTER — Encounter: Payer: Self-pay | Admitting: Physical Therapy

## 2020-11-12 ENCOUNTER — Ambulatory Visit: Payer: Medicare HMO | Admitting: Physical Therapy

## 2020-11-12 ENCOUNTER — Other Ambulatory Visit: Payer: Self-pay

## 2020-11-12 DIAGNOSIS — R600 Localized edema: Secondary | ICD-10-CM | POA: Diagnosis not present

## 2020-11-12 DIAGNOSIS — M25561 Pain in right knee: Secondary | ICD-10-CM

## 2020-11-12 DIAGNOSIS — M6281 Muscle weakness (generalized): Secondary | ICD-10-CM

## 2020-11-12 DIAGNOSIS — M25661 Stiffness of right knee, not elsewhere classified: Secondary | ICD-10-CM

## 2020-11-12 NOTE — Therapy (Signed)
[facility] [place], [place], [address] Phone: [phone]   Fax:  [phone]  Physical Therapy Treatment  Patient Details  Name: Renee Maldonado MRN: [id number] Date of Birth: Mar 09, 1946 Referring Provider (PT): Gaynelle Arabian, MD   Encounter Date: 11/12/2020   PT End of Session - 11/12/20 1114    Visit Number 12    Number of Visits 18    Date for PT Re-Evaluation 12/14/20    Authorization Type Aetna Medicare ([place] Modifier); FOTO 10th visit 51%, progress note every 10th visit    PT Start Time 1118    PT Stop Time 1204    PT Time Calculation (min) 46 min    Equipment Utilized During Treatment Other (comment)   SPC   Activity Tolerance Patient tolerated treatment well    Behavior During Therapy WFL for tasks assessed/performed           Past Medical History:  Diagnosis Date  . Anxiety   . Arthritis   . Cancer [facility])    breast Right no chemo or radiation  on Arimidex  . COPD (chronic obstructive pulmonary disease) ([place])    on Breo  . GERD (gastroesophageal reflux disease)   . Humerus fracture 2007  . Hyperlipidemia   . Hypertension   . Insomnia     Past Surgical History:  Procedure Laterality Date  . BREAST LUMPECTOMY Right 02/2019  . BREAST SURGERY Right 01/2019   breast biopsy  . JOINT REPLACEMENT     left knee  . Left shoulder surgery     2007  . TOTAL KNEE ARTHROPLASTY     2012 left  . TOTAL KNEE ARTHROPLASTY Right 10/08/2020   Procedure: TOTAL KNEE ARTHROPLASTY;  Surgeon: Gaynelle Arabian, MD;  Location: [facility];  Service: Orthopedics;  Laterality: Right;  . TUBAL LIGATION      There were no vitals filed for this visit.   Subjective Assessment - 11/12/20 1114    Subjective COVID 19 screening performed on patient upon arrival. Patient reports that she has more pain after last treatment to the point of crying. Pain related to soreness of R tibialis anterior and peroneal. Patient reports she took  oxycodone prior to coming today.    Patient is accompained by: Family member   Husband, [name]   Pertinent History R TKA 10/08/2020; HTN, COPD, osteopenia, history of L TKA    Limitations Sitting;Standing;Walking;House hold activities    Patient Stated Goals drive and run errands    Currently in Pain? Yes    Pain Score 8     Pain Location Knee    Pain Orientation Right    Pain Descriptors / Indicators Discomfort;Sore    Pain Type Surgical pain    Pain Onset 1 to 4 weeks ago    Pain Frequency Constant              [facility] PT Assessment - 11/12/20 0001      Assessment   Medical Diagnosis Presence of right artificial knee joint    Referring Provider (PT) Gaynelle Arabian, MD    Onset Date/Surgical Date 10/08/20    Next MD Visit 11/14/2020    Prior Therapy not for R knee      Precautions   Precautions Other (comment)    Precaution Comments no ultrasound      Restrictions   Weight Bearing Restrictions No      ROM / Strength   AROM / PROM / Strength AROM  AROM   Overall AROM  Within functional limits for tasks performed    AROM Assessment Site Knee    Right/Left Knee Right    Right Knee Extension -4    Right Knee Flexion 121                         [facility] Adult PT Treatment/Exercise - 11/12/20 0001      Knee/Hip Exercises: Aerobic   Nustep L3, seat 7-5 x15 min      Knee/Hip Exercises: Standing   Forward Lunges Right;20 reps;5 seconds;Limitations    Forward Lunges Limitations 8" step    Forward Step Up Right;Hand Hold: 2;Step Height: 6";20 reps    Step Down Right;20 reps;Hand Hold: 2;Step Height: 6"    Rocker Board 2 minutes      Knee/Hip Exercises: Seated   Long Arc Quad Strengthening;Right;20 reps;Weights    Long Arc Quad Weight 4 lbs.      Modalities   Modalities Vasopneumatic      Vasopneumatic   Number Minutes Vasopneumatic  10 minutes    Vasopnuematic Location  Knee    Vasopneumatic Pressure Medium    Vasopneumatic Temperature  34 for pain  and edema                       PT Long Term Goals - 11/12/20 1206      PT LONG TERM GOAL #1   Title Patient will be independent with HEP and its progression.    Time 6    Period Weeks    Status Partially Met      PT LONG TERM GOAL #2   Title Patient will demonstrate 5 degrees or less of right knee extension AROM to improve gait mechanics.    Time 6    Period Weeks    Status Achieved      PT LONG TERM GOAL #3   Title Patient will demonstrate 115+ degrees of right knee flexion AROM to improve functional tasks.    Baseline AROM 111 degrees 11/05/20    Time 6    Period Weeks    Status Achieved      PT LONG TERM GOAL #4   Title Patient will demonstrate 4/5 right knee MMT to improve stability during functional tasks.    Time 6    Period Weeks    Status On-going      PT LONG TERM GOAL #5   Title Patient will report ability to perform ADLs and home activities with right knee pain less than or equal to 3/10.    Time 6    Period Weeks    Status Achieved      PT LONG TERM GOAL #6   Title Patient will demonstrate reciprocating stair negotiation with one railing to safely enter and exit home.    Time 6    Period Weeks    Status On-going                 Plan - 11/12/20 1120    Clinical Impression Statement Patient continues to progress well after TKR although she experienced increased knee/calf related soreness over the weekend. Patient progressed through more functional strengthening today with only reports of fatigue. Patient ambulating with SPC at this time but reports lossing her cane at home and walking independently. Patient's R knee ROM measured as 4-121 deg. Normal vasopnuematic response noted following removal of the modality.    Personal Factors and  Comorbidities Age;Comorbidity 3+    Comorbidities R TKA 10/08/2020; HTN, COPD, osteopenia, history of L TKA    Examination-Activity Limitations Bathing;Locomotion  Level;Transfers;Stand;Stairs;Dressing;Hygiene/Grooming;Sit;Bed Mobility    Examination-Participation Restrictions Driving;Meal Prep    Stability/Clinical Decision Making Stable/Uncomplicated    Rehab Potential Good    PT Frequency 3x / week    PT Duration 4 weeks    PT Treatment/Interventions ADLs/Self Care Home Management;Cryotherapy;Electrical Stimulation;Gait training;Stair training;Functional mobility training;Moist Heat;Therapeutic activities;Balance training;Therapeutic exercise;Neuromuscular re-education;Manual techniques;Passive range of motion;Patient/family education;Vasopneumatic Device;Taping    PT Next Visit Plan cont with POC and  attempt bike if tolerable; Continue with ROM and strengthening; modalities PRN for pain relief.    PT Home Exercise Plan Progressive to stationary bike.    Consulted and Agree with Plan of Care Patient;Family member/caregiver    Family Member Consulted Husband, Ronnie           Patient will benefit from skilled therapeutic intervention in order to improve the following deficits and impairments:  Abnormal gait,Decreased activity tolerance,Decreased balance,Decreased strength,Decreased mobility,Decreased range of motion,Difficulty walking,Increased edema,Pain  Visit Diagnosis: Acute pain of right knee  Stiffness of right knee, not elsewhere classified  Localized edema  Muscle weakness (generalized)     Problem List Patient Active Problem List   Diagnosis Date Noted  . OA (osteoarthritis) of knee 10/08/2020  . Primary osteoarthritis of right knee 10/08/2020  . Morbid obesity ([place]) 03/08/2015  . Vitamin D deficiency 03/08/2015  . COPD (chronic obstructive pulmonary disease) ([place]) 08/11/2014  . Fall 12/19/2013  . Hyperlipidemia 06/29/2013  . Hypertension 06/29/2013  . GAD (generalized anxiety disorder) 06/29/2013  . Diverticulosis of colon without hemorrhage 06/29/2013  . GERD (gastroesophageal reflux disease) 06/29/2013  . Insomnia  06/29/2013  . Osteopenia 06/29/2013    Standley Brooking, PTA 11/12/20 12:18 PM   [facility] [place], [place], [address] Phone: [phone]   Fax:  [phone]  Name: Renee Maldonado MRN: [id number] Date of Birth: 25-Jul-1946

## 2020-11-14 DIAGNOSIS — Z471 Aftercare following joint replacement surgery: Secondary | ICD-10-CM | POA: Diagnosis not present

## 2020-11-14 DIAGNOSIS — Z96651 Presence of right artificial knee joint: Secondary | ICD-10-CM | POA: Diagnosis not present

## 2020-11-15 ENCOUNTER — Other Ambulatory Visit: Payer: Self-pay

## 2020-11-15 ENCOUNTER — Encounter: Payer: Self-pay | Admitting: Physical Therapy

## 2020-11-15 ENCOUNTER — Ambulatory Visit: Payer: Medicare HMO | Admitting: Physical Therapy

## 2020-11-15 DIAGNOSIS — M25661 Stiffness of right knee, not elsewhere classified: Secondary | ICD-10-CM | POA: Diagnosis not present

## 2020-11-15 DIAGNOSIS — M25561 Pain in right knee: Secondary | ICD-10-CM | POA: Diagnosis not present

## 2020-11-15 DIAGNOSIS — R600 Localized edema: Secondary | ICD-10-CM

## 2020-11-15 DIAGNOSIS — M6281 Muscle weakness (generalized): Secondary | ICD-10-CM | POA: Diagnosis not present

## 2020-11-15 NOTE — Therapy (Signed)
[facility] [place], [place], [address] Phone: [phone]   Fax:  [phone]  Physical Therapy Treatment  Patient Details  Name: Renee Maldonado MRN: [id number] Date of Birth: May 09, 1946 Referring Provider (PT): Gaynelle Arabian, MD   Encounter Date: 11/15/2020   PT End of Session - 11/15/20 1121    Visit Number 13    Number of Visits 18    Date for PT Re-Evaluation 12/14/20    Authorization Type Aetna Medicare ([place] Modifier); FOTO 10th visit 51%, progress note every 10th visit    PT Start Time 1118    PT Stop Time 1203    PT Time Calculation (min) 45 min    Equipment Utilized During Treatment Other (comment)   SPC   Activity Tolerance Patient tolerated treatment well    Behavior During Therapy WFL for tasks assessed/performed           Past Medical History:  Diagnosis Date  . Anxiety   . Arthritis   . Cancer [facility])    breast Right no chemo or radiation  on Arimidex  . COPD (chronic obstructive pulmonary disease) ([place])    on Breo  . GERD (gastroesophageal reflux disease)   . Humerus fracture 2007  . Hyperlipidemia   . Hypertension   . Insomnia     Past Surgical History:  Procedure Laterality Date  . BREAST LUMPECTOMY Right 02/2019  . BREAST SURGERY Right 01/2019   breast biopsy  . JOINT REPLACEMENT     left knee  . Left shoulder surgery     2007  . TOTAL KNEE ARTHROPLASTY     2012 left  . TOTAL KNEE ARTHROPLASTY Right 10/08/2020   Procedure: TOTAL KNEE ARTHROPLASTY;  Surgeon: Gaynelle Arabian, MD;  [facility]: [facility];  Service: Orthopedics;  Laterality: Right;  . TUBAL LIGATION      There were no vitals filed for this visit.   Subjective Assessment - 11/15/20 1119    Subjective COVID 19 screening performed on patient upon arrival. Patient reports that PA was very pleased with her progress.    Patient is accompained by: Family member   Husband, Renee Maldonado   Pertinent History R TKA 10/08/2020; HTN, COPD,  osteopenia, history of L TKA    Limitations Sitting;Standing;Walking;House hold activities    Patient Stated Goals drive and run errands    Currently in Pain? Yes    Pain Score 3     Pain Location Knee    Pain Orientation Right    Pain Descriptors / Indicators Nagging;Aching    Pain Type Surgical pain    Pain Onset More than a month ago    Pain Frequency Constant              [facility] PT Assessment - 11/15/20 0001      Assessment   Medical Diagnosis Presence of right artificial knee joint    Referring Provider (PT) Gaynelle Arabian, MD    Onset Date/Surgical Date 10/08/20    Next MD Visit 12/2020    Prior Therapy not for R knee      Precautions   Precautions Other (comment)    Precaution Comments no ultrasound      Restrictions   Weight Bearing Restrictions No                         [facility] Adult PT Treatment/Exercise - 11/15/20 0001      Knee/Hip Exercises: Aerobic   Nustep L3, seat 6  x15 min      Knee/Hip Exercises: Machines for Strengthening   Cybex Knee Flexion 10# 2x10 reps    Cybex Leg Press 20# 2x10 reps      Modalities   Modalities Vasopneumatic      Vasopneumatic   Number Minutes Vasopneumatic  10 minutes    Vasopnuematic Location  Knee    Vasopneumatic Pressure Medium    Vasopneumatic Temperature  34 for pain and edema               Balance Exercises - 11/15/20 0001      Balance Exercises: Standing   Standing Eyes Opened Narrow base of support (BOS);Foam/compliant surface;Time    Standing Eyes Opened Time x2 min on airex    Tandem Stance Eyes open;Intermittent upper extremity support;Time    Tandem Stance Limitations x2 min    SLS Eyes open;Solid surface;1 rep;Time    SLS Time x1 min RLE                  PT Long Term Goals - 11/12/20 1206      PT LONG TERM GOAL #1   Title Patient will be independent with HEP and its progression.    Time 6    Period Weeks    Status Partially Met      PT LONG TERM GOAL #2   Title  Patient will demonstrate 5 degrees or less of right knee extension AROM to improve gait mechanics.    Time 6    Period Weeks    Status Achieved      PT LONG TERM GOAL #3   Title Patient will demonstrate 115+ degrees of right knee flexion AROM to improve functional tasks.    Baseline AROM 111 degrees 11/05/20    Time 6    Period Weeks    Status Achieved      PT LONG TERM GOAL #4   Title Patient will demonstrate 4/5 right knee MMT to improve stability during functional tasks.    Time 6    Period Weeks    Status On-going      PT LONG TERM GOAL #5   Title Patient will report ability to perform ADLs and home activities with right knee pain less than or equal to 3/10.    Time 6    Period Weeks    Status Achieved      PT LONG TERM GOAL #6   Title Patient will demonstrate reciprocating stair negotiation with one railing to safely enter and exit home.    Time 6    Period Weeks    Status On-going                 Plan - 11/15/20 1211    Clinical Impression Statement Patient presented in clinic with reports of a good visit from MD. Patient progressed to light machine strengthening with limited reps as well to avoid excessive soreness. Patient also reported balance deficits as well which was attempted in standing with close supervision. Patient still using SPC as she feels unbalance without UE support. Increased edema notable today prior to therex and after therex. Normal vasopneumatic response noted following removal of the modality.    Personal Factors and Comorbidities Age;Comorbidity 3+    Comorbidities R TKA 10/08/2020; HTN, COPD, osteopenia, history of L TKA    Examination-Activity Limitations Bathing;Locomotion Level;Transfers;Stand;Stairs;Dressing;Hygiene/Grooming;Sit;Bed Mobility    Examination-Participation Restrictions Driving;Meal Prep    Stability/Clinical Decision Making Stable/Uncomplicated    Rehab Potential Good  PT Frequency 3x / week    PT Duration 4 weeks     PT Treatment/Interventions ADLs/Self Care Home Management;Cryotherapy;Electrical Stimulation;Gait training;Stair training;Functional mobility training;Moist Heat;Therapeutic activities;Balance training;Therapeutic exercise;Neuromuscular re-education;Manual techniques;Passive range of motion;Patient/family education;Vasopneumatic Device;Taping    PT Next Visit Plan cont with POC and  attempt bike if tolerable; Continue with ROM and strengthening; modalities PRN for pain relief.    PT Home Exercise Plan Progressive to stationary bike.    Consulted and Agree with Plan of Care Patient;Family member/caregiver    Family Member Consulted Husband, Renee Maldonado           Patient will benefit from skilled therapeutic intervention in order to improve the following deficits and impairments:  Abnormal gait,Decreased activity tolerance,Decreased balance,Decreased strength,Decreased mobility,Decreased range of motion,Difficulty walking,Increased edema,Pain  Visit Diagnosis: Acute pain of right knee  Stiffness of right knee, not elsewhere classified  Localized edema  Muscle weakness (generalized)     Problem List Patient Active Problem List   Diagnosis Date Noted  . OA (osteoarthritis) of knee 10/08/2020  . Primary osteoarthritis of right knee 10/08/2020  . Morbid obesity ([place]) 03/08/2015  . Vitamin D deficiency 03/08/2015  . COPD (chronic obstructive pulmonary disease) ([place]) 08/11/2014  . Fall 12/19/2013  . Hyperlipidemia 06/29/2013  . Hypertension 06/29/2013  . GAD (generalized anxiety disorder) 06/29/2013  . Diverticulosis of colon without hemorrhage 06/29/2013  . GERD (gastroesophageal reflux disease) 06/29/2013  . Insomnia 06/29/2013  . Osteopenia 06/29/2013    Standley Brooking, PTA 11/15/2020, 12:17 PM  [facility] [address] [place], [place], [address] Phone: [phone]   Fax:  [phone]  Name: Renee Maldonado MRN: [id number] Date of  Birth: 21-Oct-1946

## 2020-11-17 HISTORY — PX: COLONOSCOPY: SHX174

## 2020-11-19 ENCOUNTER — Encounter: Payer: Medicare Other | Admitting: Physical Therapy

## 2020-11-21 ENCOUNTER — Encounter: Payer: Self-pay | Admitting: Physical Therapy

## 2020-11-21 ENCOUNTER — Other Ambulatory Visit: Payer: Self-pay

## 2020-11-21 ENCOUNTER — Ambulatory Visit: Payer: Medicare HMO | Attending: Orthopedic Surgery | Admitting: Physical Therapy

## 2020-11-21 DIAGNOSIS — R600 Localized edema: Secondary | ICD-10-CM | POA: Insufficient documentation

## 2020-11-21 DIAGNOSIS — M25561 Pain in right knee: Secondary | ICD-10-CM | POA: Insufficient documentation

## 2020-11-21 DIAGNOSIS — M25661 Stiffness of right knee, not elsewhere classified: Secondary | ICD-10-CM | POA: Insufficient documentation

## 2020-11-21 DIAGNOSIS — M6281 Muscle weakness (generalized): Secondary | ICD-10-CM | POA: Diagnosis not present

## 2020-11-21 NOTE — Therapy (Signed)
[facility] [place], [place], [address] Phone: [phone]   Fax:  [phone]  Physical Therapy Treatment  Patient Details  Name: Renee Maldonado MRN: [id number] Date of Birth: October 01, 1946 Referring Provider (PT): Gaynelle Arabian, MD   Encounter Date: 11/21/2020   PT End of Session - 11/21/20 1131    Visit Number 14    Number of Visits 18    Date for PT Re-Evaluation 12/14/20    Authorization Type Aetna Medicare ([place] Modifier); FOTO 10th visit 51%, progress note every 10th visit    PT Start Time 1123    PT Stop Time 1159    PT Time Calculation (min) 36 min    Equipment Utilized During Treatment Other (comment)   SPC   Activity Tolerance Patient tolerated treatment well    Behavior During Therapy WFL for tasks assessed/performed           Past Medical History:  Diagnosis Date  . Anxiety   . Arthritis   . Cancer [facility])    breast Right no chemo or radiation  on Arimidex  . COPD (chronic obstructive pulmonary disease) ([place])    on Breo  . GERD (gastroesophageal reflux disease)   . Humerus fracture 2007  . Hyperlipidemia   . Hypertension   . Insomnia     Past Surgical History:  Procedure Laterality Date  . BREAST LUMPECTOMY Right 02/2019  . BREAST SURGERY Right 01/2019   breast biopsy  . JOINT REPLACEMENT     left knee  . Left shoulder surgery     2007  . TOTAL KNEE ARTHROPLASTY     2012 left  . TOTAL KNEE ARTHROPLASTY Right 10/08/2020   Procedure: TOTAL KNEE ARTHROPLASTY;  Surgeon: Gaynelle Arabian, MD;  [facility];  Service: Orthopedics;  Laterality: Right;  . TUBAL LIGATION      There were no vitals filed for this visit.   Subjective Assessment - 11/21/20 1128    Subjective COVID 19 screening performed on patient upon arrival. Reports that she had to cancel some visits due to weather but hasn't been too bad. Patient reports trying to lie supine for a while in the afternoons. Was cleared by surgeon's  office to start driving after 6 weeks but her husband doesn't want her driving yet.    Patient is accompained by: Family member   Husband, [name]   Pertinent History R TKA 10/08/2020; HTN, COPD, osteopenia, history of L TKA    Limitations Sitting;Standing;Walking;House hold activities    Patient Stated Goals drive and run errands    Currently in Pain? Yes    Pain Score 5     Pain Location Knee    Pain Orientation Right    Pain Descriptors / Indicators Discomfort    Pain Type Surgical pain    Pain Onset More than a month ago    Pain Frequency Intermittent              [facility] PT Assessment - 11/21/20 0001      Assessment   Medical Diagnosis Presence of right artificial knee joint    Referring Provider (PT) Gaynelle Arabian, MD    Onset Date/Surgical Date 10/08/20    Next MD Visit 12/2020    Prior Therapy not for R knee      Precautions   Precautions Other (comment)    Precaution Comments no ultrasound      Restrictions   Weight Bearing Restrictions No  [place] Adult PT Treatment/Exercise - 11/21/20 0001      Knee/Hip Exercises: Aerobic   Nustep L6, sesat 6 x10 min      Knee/Hip Exercises: Machines for Strengthening   Cybex Knee Extension 10# 2x10 reps    Cybex Knee Flexion 20# 2x10 reps      Knee/Hip Exercises: Standing   Forward Lunges Right;20 reps;5 seconds;Limitations    Forward Lunges Limitations 8" step    Step Down Right;20 reps;Hand Hold: 2;Step Height: 4"    SLS RLE on airex x1 min with light UE support    Other Standing Knee Exercises DLS on horizontal beam x2 min      Modalities   Modalities Vasopneumatic      Vasopneumatic   Number Minutes Vasopneumatic  10 minutes    Vasopnuematic Location  Knee    Vasopneumatic Pressure Medium    Vasopneumatic Temperature  34 for pain and edema                       PT Long Term Goals - 11/12/20 1206      PT LONG TERM GOAL #1   Title Patient will be independent with  HEP and its progression.    Time 6    Period Weeks    Status Partially Met      PT LONG TERM GOAL #2   Title Patient will demonstrate 5 degrees or less of right knee extension AROM to improve gait mechanics.    Time 6    Period Weeks    Status Achieved      PT LONG TERM GOAL #3   Title Patient will demonstrate 115+ degrees of right knee flexion AROM to improve functional tasks.    Baseline AROM 111 degrees 11/05/20    Time 6    Period Weeks    Status Achieved      PT LONG TERM GOAL #4   Title Patient will demonstrate 4/5 right knee MMT to improve stability during functional tasks.    Time 6    Period Weeks    Status On-going      PT LONG TERM GOAL #5   Title Patient will report ability to perform ADLs and home activities with right knee pain less than or equal to 3/10.    Time 6    Period Weeks    Status Achieved      PT LONG TERM GOAL #6   Title Patient will demonstrate reciprocating stair negotiation with one railing to safely enter and exit home.    Time 6    Period Weeks    Status On-going                 Plan - 11/21/20 1203    Clinical Impression Statement Patient presented in clinic with reports of moderate discomfort of R knee. Patient progressed to more resistance with machine strengthening as well as Nustep. Patient did report increased discomfort intermittantly with therex and required encouragement with all therex. Patient slow with ambulation as well as transfers today. Normal vasopneumatic response noted following removal of the modality.    Personal Factors and Comorbidities Age;Comorbidity 3+    Comorbidities R TKA 10/08/2020; HTN, COPD, osteopenia, history of L TKA    Examination-Activity Limitations Bathing;Locomotion Level;Transfers;Stand;Stairs;Dressing;Hygiene/Grooming;Sit;Bed Mobility    Examination-Participation Restrictions Driving;Meal Prep    Stability/Clinical Decision Making Stable/Uncomplicated    Rehab Potential Good    PT Frequency  3x / week    PT Duration 4  weeks    PT Treatment/Interventions ADLs/Self Care Home Management;Cryotherapy;Electrical Stimulation;Gait training;Stair training;Functional mobility training;Moist Heat;Therapeutic activities;Balance training;Therapeutic exercise;Neuromuscular re-education;Manual techniques;Passive range of motion;Patient/family education;Vasopneumatic Device;Taping    PT Next Visit Plan cont with POC and  attempt bike if tolerable; Continue with ROM and strengthening; modalities PRN for pain relief.    PT Home Exercise Plan Progressive to stationary bike.    Consulted and Agree with Plan of Care Patient;Family member/caregiver    Family Member Consulted Husband, Ronnie           Patient will benefit from skilled therapeutic intervention in order to improve the following deficits and impairments:  Abnormal gait,Decreased activity tolerance,Decreased balance,Decreased strength,Decreased mobility,Decreased range of motion,Difficulty walking,Increased edema,Pain  Visit Diagnosis: Acute pain of right knee  Stiffness of right knee, not elsewhere classified  Localized edema  Muscle weakness (generalized)     Problem List Patient Active Problem List   Diagnosis Date Noted  . OA (osteoarthritis) of knee 10/08/2020  . Primary osteoarthritis of right knee 10/08/2020  . Morbid obesity ([place]) 03/08/2015  . Vitamin D deficiency 03/08/2015  . COPD (chronic obstructive pulmonary disease) ([place]) 08/11/2014  . Fall 12/19/2013  . Hyperlipidemia 06/29/2013  . Hypertension 06/29/2013  . GAD (generalized anxiety disorder) 06/29/2013  . Diverticulosis of colon without hemorrhage 06/29/2013  . GERD (gastroesophageal reflux disease) 06/29/2013  . Insomnia 06/29/2013  . Osteopenia 06/29/2013    Standley Brooking, PTA 11/21/2020, 12:08 PM  [facility] [address] [place], [place], [address] Phone: [phone]   Fax:  [phone]  Name:  Renee Maldonado MRN: [id number] Date of Birth: 10/02/1946

## 2020-11-23 ENCOUNTER — Encounter: Payer: Medicare Other | Admitting: Physical Therapy

## 2020-11-26 ENCOUNTER — Ambulatory Visit: Payer: Medicare HMO | Admitting: Physical Therapy

## 2020-11-26 ENCOUNTER — Other Ambulatory Visit: Payer: Self-pay

## 2020-11-26 ENCOUNTER — Encounter: Payer: Self-pay | Admitting: Physical Therapy

## 2020-11-26 ENCOUNTER — Other Ambulatory Visit: Payer: Self-pay | Admitting: Family Medicine

## 2020-11-26 DIAGNOSIS — M25561 Pain in right knee: Secondary | ICD-10-CM | POA: Diagnosis not present

## 2020-11-26 DIAGNOSIS — M6281 Muscle weakness (generalized): Secondary | ICD-10-CM

## 2020-11-26 DIAGNOSIS — M25661 Stiffness of right knee, not elsewhere classified: Secondary | ICD-10-CM

## 2020-11-26 DIAGNOSIS — R600 Localized edema: Secondary | ICD-10-CM

## 2020-11-26 NOTE — Therapy (Signed)
[facility] [place], [place], [address] Phone: [phone]   Fax:  [phone]  Physical Therapy Treatment  Patient Details  Name: Renee Maldonado MRN: [id number] Date of Birth: [birth year] Referring Provider (PT): Gaynelle Arabian, MD   Encounter Date: 11/26/2020   PT End of Session - 11/26/20 1206    Visit Number 15    Number of Visits 18    Date for PT Re-Evaluation 12/14/20    Authorization Type Aetna Medicare ([place] Modifier); FOTO 10th visit 51%, progress note every 10th visit    PT Start Time 1116    PT Stop Time 1203    PT Time Calculation (min) 47 min    Equipment Utilized During Treatment Other (comment)   SPC   Activity Tolerance Patient tolerated treatment well    Behavior During Therapy WFL for tasks assessed/performed           Past Medical History:  Diagnosis Date  . Anxiety   . Arthritis   . Cancer [facility])    breast Right no chemo or radiation  on Arimidex  . COPD (chronic obstructive pulmonary disease) ([place])    on Breo  . GERD (gastroesophageal reflux disease)   . Humerus fracture 2007  . Hyperlipidemia   . Hypertension   . Insomnia     Past Surgical History:  Procedure Laterality Date  . BREAST LUMPECTOMY Right 02/2019  . BREAST SURGERY Right 01/2019   breast biopsy  . JOINT REPLACEMENT     left knee  . Left shoulder surgery     2007  . TOTAL KNEE ARTHROPLASTY     2012 left  . TOTAL KNEE ARTHROPLASTY Right 10/08/2020   Procedure: TOTAL KNEE ARTHROPLASTY;  Surgeon: Gaynelle Arabian, MD;  Location: [facility];  Service: Orthopedics;  Laterality: Right;  . TUBAL LIGATION      There were no vitals filed for this visit.   Subjective Assessment - 11/26/20 1110    Subjective COVID 19 screening performed on patient upon arrival. Reports driving to PT clinic and church by herself. No pain upon arrival.    Pertinent History R TKA 10/08/2020; HTN, COPD, osteopenia, history of L TKA    Limitations  Sitting;Standing;Walking;House hold activities    Patient Stated Goals drive and run errands    Currently in Pain? No/denies              [facility] PT Assessment - 11/26/20 0001      Assessment   Medical Diagnosis Presence of right artificial knee joint    Referring Provider (PT) Gaynelle Arabian, MD    Onset Date/Surgical Date 10/08/20    Next MD Visit 12/2020    Prior Therapy not for R knee      Precautions   Precautions Other (comment)    Precaution Comments no ultrasound      Restrictions   Weight Bearing Restrictions No                         [facility] Adult PT Treatment/Exercise - 11/26/20 0001      Knee/Hip Exercises: Aerobic   Nustep L4, seat 6 x10 min      Knee/Hip Exercises: Machines for Strengthening   Cybex Knee Extension 10# 2x10 reps    Cybex Knee Flexion 30# 2x10 reps      Knee/Hip Exercises: Standing   Heel Raises Both;2 sets;10 reps    Heel Raises Limitations B toe raises x20  Knee/Hip Exercises: Seated   Clamshell with TheraBand Green   x20 reps   Sit to Sand 15 reps;without UE support   RLE SL sit <> stand     Knee/Hip Exercises: Supine   Bridges Strengthening;20 reps    Straight Leg Raises Strengthening;Right;20 reps      Modalities   Modalities Vasopneumatic      Vasopneumatic   Number Minutes Vasopneumatic  15 minutes    Vasopnuematic Location  Knee    Vasopneumatic Pressure Medium    Vasopneumatic Temperature  34/edema                       PT Long Term Goals - 11/12/20 1206      PT LONG TERM GOAL #1   Title Patient will be independent with HEP and its progression.    Time 6    Period Weeks    Status Partially Met      PT LONG TERM GOAL #2   Title Patient will demonstrate 5 degrees or less of right knee extension AROM to improve gait mechanics.    Time 6    Period Weeks    Status Achieved      PT LONG TERM GOAL #3   Title Patient will demonstrate 115+ degrees of right knee flexion AROM to improve  functional tasks.    Baseline AROM 111 degrees 11/05/20    Time 6    Period Weeks    Status Achieved      PT LONG TERM GOAL #4   Title Patient will demonstrate 4/5 right knee MMT to improve stability during functional tasks.    Time 6    Period Weeks    Status On-going      PT LONG TERM GOAL #5   Title Patient will report ability to perform ADLs and home activities with right knee pain less than or equal to 3/10.    Time 6    Period Weeks    Status Achieved      PT LONG TERM GOAL #6   Title Patient will demonstrate reciprocating stair negotiation with one railing to safely enter and exit home.    Time 6    Period Weeks    Status On-going                 Plan - 11/26/20 1157    Clinical Impression Statement Patient able to tolerate treatment well today as she arrived with no R knee pain. Patient progressed to more strengthening and encouraging WBing through RLE for sit <> stands. Patient required mod multimodal cueing for technique assessment. Patient very pleased with the none to minimal pain she was experiencing during PT session. Patient still using SPC for ambulation support. Normal vasopneumatic response noted following removal of the modality.    Personal Factors and Comorbidities Age;Comorbidity 3+    Comorbidities R TKA 10/08/2020; HTN, COPD, osteopenia, history of L TKA    Examination-Activity Limitations Bathing;Locomotion Level;Transfers;Stand;Stairs;Dressing;Hygiene/Grooming;Sit;Bed Mobility    Examination-Participation Restrictions Driving;Meal Prep    Stability/Clinical Decision Making Stable/Uncomplicated    Rehab Potential Good    PT Frequency 3x / week    PT Duration 4 weeks    PT Treatment/Interventions ADLs/Self Care Home Management;Cryotherapy;Electrical Stimulation;Gait training;Stair training;Functional mobility training;Moist Heat;Therapeutic activities;Balance training;Therapeutic exercise;Neuromuscular re-education;Manual techniques;Passive range of  motion;Patient/family education;Vasopneumatic Device;Taping    PT Next Visit Plan cont with POC and  attempt bike if tolerable; Continue with ROM and strengthening; modalities PRN for pain relief.  PT Home Exercise Plan Progressive to stationary bike.    Consulted and Agree with Plan of Care Patient    Family Member Consulted [place], [place]           Patient will benefit from skilled therapeutic intervention in order to improve the following deficits and impairments:  Abnormal gait,Decreased activity tolerance,Decreased balance,Decreased strength,Decreased mobility,Decreased range of motion,Difficulty walking,Increased edema,Pain  Visit Diagnosis: Acute pain of right knee  Stiffness of right knee, not elsewhere classified  Localized edema  Muscle weakness (generalized)     Problem List Patient Active Problem List   Diagnosis Date Noted  . OA (osteoarthritis) of knee 10/08/2020  . Primary osteoarthritis of right knee 10/08/2020  . Morbid obesity ([place]) 03/08/2015  . Vitamin D deficiency 03/08/2015  . COPD (chronic obstructive pulmonary disease) ([place]) 08/11/2014  . Fall 12/19/2013  . Hyperlipidemia 06/29/2013  . Hypertension 06/29/2013  . GAD (generalized anxiety disorder) 06/29/2013  . Diverticulosis of colon without hemorrhage 06/29/2013  . GERD (gastroesophageal reflux disease) 06/29/2013  . Insomnia 06/29/2013  . Osteopenia 06/29/2013    Standley Brooking, PTA 11/26/2020, 12:07 PM  [facility] [address] [place], [place], [address] Phone: [phone]   Fax:  [phone]  Name: Renee Maldonado MRN: [id number] Date of Birth: [birth year]

## 2020-11-28 ENCOUNTER — Encounter: Payer: Self-pay | Admitting: Physical Therapy

## 2020-11-28 ENCOUNTER — Ambulatory Visit: Payer: Medicare HMO | Admitting: Physical Therapy

## 2020-11-28 ENCOUNTER — Other Ambulatory Visit: Payer: Self-pay

## 2020-11-28 DIAGNOSIS — M6281 Muscle weakness (generalized): Secondary | ICD-10-CM

## 2020-11-28 DIAGNOSIS — M25561 Pain in right knee: Secondary | ICD-10-CM | POA: Diagnosis not present

## 2020-11-28 DIAGNOSIS — M25661 Stiffness of right knee, not elsewhere classified: Secondary | ICD-10-CM | POA: Diagnosis not present

## 2020-11-28 DIAGNOSIS — R600 Localized edema: Secondary | ICD-10-CM

## 2020-11-28 DIAGNOSIS — C50311 Malignant neoplasm of lower-inner quadrant of right female breast: Secondary | ICD-10-CM | POA: Diagnosis not present

## 2020-11-28 NOTE — Therapy (Signed)
[facility] [place], [place], [address] Phone: [phone]   Fax:  [phone]  Physical Therapy Treatment  Patient Details  Name: Renee Maldonado MRN: [id number] Date of Birth: [birth year] Referring Provider (PT): Gaynelle Arabian, MD   Encounter Date: 11/28/2020   PT End of Session - 11/28/20 1135    Visit Number 16    Number of Visits 18    Date for PT Re-Evaluation 12/14/20    Authorization Type Aetna Medicare ([place] Modifier); FOTO 10th visit 51%, progress note every 10th visit    PT Start Time 1115    PT Stop Time 1200    PT Time Calculation (min) 45 min    Equipment Utilized During Treatment Other (comment)   SPC   Activity Tolerance Patient tolerated treatment well    Behavior During Therapy WFL for tasks assessed/performed           Past Medical History:  Diagnosis Date  . Anxiety   . Arthritis   . Cancer [facility])    breast Right no chemo or radiation  on Arimidex  . COPD (chronic obstructive pulmonary disease) ([place])    on Breo  . GERD (gastroesophageal reflux disease)   . Humerus fracture 2007  . Hyperlipidemia   . Hypertension   . Insomnia     Past Surgical History:  Procedure Laterality Date  . BREAST LUMPECTOMY Right 02/2019  . BREAST SURGERY Right 01/2019   breast biopsy  . JOINT REPLACEMENT     left knee  . Left shoulder surgery     2007  . TOTAL KNEE ARTHROPLASTY     2012 left  . TOTAL KNEE ARTHROPLASTY Right 10/08/2020   Procedure: TOTAL KNEE ARTHROPLASTY;  Surgeon: Gaynelle Arabian, MD;  [facility];  Service: Orthopedics;  Laterality: Right;  . TUBAL LIGATION      There were no vitals filed for this visit.   Subjective Assessment - 11/28/20 1131    Subjective COVID 19 screening performed on patient upon arrival. Reports she didn't sleep very well on Monday but able to sleep better last night.    Pertinent History R TKA 10/08/2020; HTN, COPD, osteopenia, history of L TKA    Limitations  Sitting;Standing;Walking;House hold activities    Patient Stated Goals drive and run errands    Currently in Pain? Yes    Pain Score 2     Pain Location Knee    Pain Orientation Right    Pain Descriptors / Indicators Discomfort    Pain Type Surgical pain    Pain Onset More than a month ago    Pain Frequency Intermittent              [facility] PT Assessment - 11/28/20 0001      Assessment   Medical Diagnosis Presence of right artificial knee joint    Referring Provider (PT) Gaynelle Arabian, MD    Onset Date/Surgical Date 10/08/20    Next MD Visit 12/2020    Prior Therapy not for R knee      Precautions   Precautions Other (comment)    Precaution Comments no ultrasound      Restrictions   Weight Bearing Restrictions No                         [facility] Adult PT Treatment/Exercise - 11/28/20 0001      Ambulation/Gait   Stairs Yes    Stairs Assistance 6: Modified independent (Device/Increase time)  Stair Management Technique One rail Left;Alternating pattern    Number of Stairs 4    Height of Stairs 6.5      Knee/Hip Exercises: Aerobic   Nustep L5, seat 6 x15 min      Knee/Hip Exercises: Machines for Strengthening   Cybex Knee Extension 10# 2x10 reps    Cybex Knee Flexion 30# 2x10 reps      Knee/Hip Exercises: Standing   Step Down Right;20 reps;Hand Hold: 2;Step Height: 6"      Knee/Hip Exercises: Seated   Sit to Sand 15 reps;without UE support   RLE SL, LLE toe touch     Modalities   Modalities Vasopneumatic      Vasopneumatic   Number Minutes Vasopneumatic  15 minutes    Vasopnuematic Location  Knee    Vasopneumatic Pressure Medium    Vasopneumatic Temperature  34/edema                       PT Long Term Goals - 11/28/20 1146      PT LONG TERM GOAL #1   Title Patient will be independent with HEP and its progression.    Time 6    Period Weeks    Status Partially Met      PT LONG TERM GOAL #2   Title Patient will demonstrate 5  degrees or less of right knee extension AROM to improve gait mechanics.    Time 6    Period Weeks    Status Achieved      PT LONG TERM GOAL #3   Title Patient will demonstrate 115+ degrees of right knee flexion AROM to improve functional tasks.    Baseline AROM 111 degrees 11/05/20    Time 6    Period Weeks    Status Achieved      PT LONG TERM GOAL #4   Title Patient will demonstrate 4/5 right knee MMT to improve stability during functional tasks.    Time 6    Period Weeks    Status On-going      PT LONG TERM GOAL #5   Title Patient will report ability to perform ADLs and home activities with right knee pain less than or equal to 3/10.    Time 6    Period Weeks    Status Achieved      PT LONG TERM GOAL #6   Title Patient will demonstrate reciprocating stair negotiation with one railing to safely enter and exit home.    Time 6    Period Weeks    Status Achieved   safe but 3-4/10 R knee pain descending 11/28/2020                Plan - 11/28/20 1208    Clinical Impression Statement Patient presented in clinic although feeling more tired from limited sleep over the past few days. Patient noted that machine strengthening is easier now and able to feel her LEs getting stronger. Patient interested in a senior strength and balance classes in the community to maintain strength. Patient more limited with descending stairs due to R knee ROM required with 3-4/10 pain. Normal vasopneumatic response noted following removal of the modality.    Personal Factors and Comorbidities Age;Comorbidity 3+    Comorbidities R TKA 10/08/2020; HTN, COPD, osteopenia, history of L TKA    Examination-Activity Limitations Bathing;Locomotion Level;Transfers;Stand;Stairs;Dressing;Hygiene/Grooming;Sit;Bed Mobility    Examination-Participation Restrictions Driving;Meal Prep    Stability/Clinical Decision Making Stable/Uncomplicated    Rehab Potential Good  PT Frequency 3x / week    PT Duration 4 weeks     PT Treatment/Interventions ADLs/Self Care Home Management;Cryotherapy;Electrical Stimulation;Gait training;Stair training;Functional mobility training;Moist Heat;Therapeutic activities;Balance training;Therapeutic exercise;Neuromuscular re-education;Manual techniques;Passive range of motion;Patient/family education;Vasopneumatic Device;Taping    PT Next Visit Plan cont with POC and  attempt bike if tolerable; Continue with ROM and strengthening; modalities PRN for pain relief.    PT Home Exercise Plan Progressive to stationary bike.    Consulted and Agree with Plan of Care Patient           Patient will benefit from skilled therapeutic intervention in order to improve the following deficits and impairments:  Abnormal gait,Decreased activity tolerance,Decreased balance,Decreased strength,Decreased mobility,Decreased range of motion,Difficulty walking,Increased edema,Pain  Visit Diagnosis: Acute pain of right knee  Stiffness of right knee, not elsewhere classified  Localized edema  Muscle weakness (generalized)     Problem List Patient Active Problem List   Diagnosis Date Noted  . OA (osteoarthritis) of knee 10/08/2020  . Primary osteoarthritis of right knee 10/08/2020  . Morbid obesity ([place]) 03/08/2015  . Vitamin D deficiency 03/08/2015  . COPD (chronic obstructive pulmonary disease) ([place]) 08/11/2014  . Fall 12/19/2013  . Hyperlipidemia 06/29/2013  . Hypertension 06/29/2013  . GAD (generalized anxiety disorder) 06/29/2013  . Diverticulosis of colon without hemorrhage 06/29/2013  . GERD (gastroesophageal reflux disease) 06/29/2013  . Insomnia 06/29/2013  . Osteopenia 06/29/2013    Standley Brooking, PTA 11/28/2020, 12:13 PM  [facility] [place], [place], [address] Phone: [phone]   Fax:  [phone]  Name: Renee Maldonado MRN: [id number] Date of Birth: 02/03/1946

## 2020-12-03 ENCOUNTER — Ambulatory Visit: Payer: Medicare HMO | Admitting: Physical Therapy

## 2020-12-05 ENCOUNTER — Encounter: Payer: Self-pay | Admitting: Physical Therapy

## 2020-12-05 ENCOUNTER — Ambulatory Visit: Payer: Medicare HMO | Admitting: Physical Therapy

## 2020-12-05 ENCOUNTER — Other Ambulatory Visit: Payer: Self-pay

## 2020-12-05 DIAGNOSIS — R600 Localized edema: Secondary | ICD-10-CM

## 2020-12-05 DIAGNOSIS — M25561 Pain in right knee: Secondary | ICD-10-CM

## 2020-12-05 DIAGNOSIS — M6281 Muscle weakness (generalized): Secondary | ICD-10-CM | POA: Diagnosis not present

## 2020-12-05 DIAGNOSIS — M25661 Stiffness of right knee, not elsewhere classified: Secondary | ICD-10-CM | POA: Diagnosis not present

## 2020-12-05 NOTE — Therapy (Signed)
[facility] [place], [place], [address] Phone: [phone]   Fax:  [phone]  Physical Therapy Treatment  Patient Details  Name: Renee Maldonado MRN: [id number] Date of Birth: 09-10-1946 Referring Provider (PT): Gaynelle Arabian, MD   Encounter Date: 12/05/2020   PT End of Session - 12/05/20 1133    Visit Number 17    Number of Visits 18    Date for PT Re-Evaluation 12/14/20    Authorization Type Aetna Medicare ([place] Modifier); FOTO 10th visit 51%, progress note every 10th visit    PT Start Time 1118    PT Stop Time 1212    PT Time Calculation (min) 54 min    Equipment Utilized During Treatment Other (comment)   SPC   Activity Tolerance Patient tolerated treatment well    Behavior During Therapy WFL for tasks assessed/performed           Past Medical History:  Diagnosis Date  . Anxiety   . Arthritis   . Cancer [facility])    breast Right no chemo or radiation  on Arimidex  . COPD (chronic obstructive pulmonary disease) ([place])    on Breo  . GERD (gastroesophageal reflux disease)   . Humerus fracture 2007  . Hyperlipidemia   . Hypertension   . Insomnia     Past Surgical History:  Procedure Laterality Date  . BREAST LUMPECTOMY Right 02/2019  . BREAST SURGERY Right 01/2019   breast biopsy  . JOINT REPLACEMENT     left knee  . Left shoulder surgery     2007  . TOTAL KNEE ARTHROPLASTY     2012 left  . TOTAL KNEE ARTHROPLASTY Right 10/08/2020   Procedure: TOTAL KNEE ARTHROPLASTY;  Surgeon: Gaynelle Arabian, MD;  Location: [facility];  Service: Orthopedics;  Laterality: Right;  . TUBAL LIGATION      There were no vitals filed for this visit.   Subjective Assessment - 12/05/20 1129    Subjective COVID 19 screening performed on patient upon arrival. Reports it continues to ache at night.    Pertinent History R TKA 10/08/2020; HTN, COPD, osteopenia, history of L TKA    Limitations Sitting;Standing;Walking;House hold  activities    Patient Stated Goals drive and run errands    Currently in Pain? Yes    Pain Score 4     Pain Location Knee    Pain Orientation Right    Pain Descriptors / Indicators Aching    Pain Type Surgical pain    Pain Onset More than a month ago    Pain Frequency Intermittent              [facility] PT Assessment - 12/05/20 0001      Assessment   Medical Diagnosis Presence of right artificial knee joint    Referring Provider (PT) Gaynelle Arabian, MD    Onset Date/Surgical Date 10/08/20    Next MD Visit 12/25/2020    Prior Therapy not for R knee      Precautions   Precautions Other (comment)    Precaution Comments no ultrasound      Restrictions   Weight Bearing Restrictions No      Circumferential Edema   Circumferential - Right 43.5 cm   infrapatella 39.3 cm   Circumferential - Left  43 cm   infrapatella 39 cm                        [facility] Adult PT Treatment/Exercise -  12/05/20 0001      Knee/Hip Exercises: Aerobic   Nustep L1-4, seat 6 x15 min      Knee/Hip Exercises: Machines for Strengthening   Cybex Knee Extension 10# 2x10 reps    Cybex Knee Flexion 30# 2x10 reps      Knee/Hip Exercises: Standing   Terminal Knee Extension Strengthening;Right;20 reps;Limitations    Terminal Knee Extension Limitations Blue XTS    Hip Abduction Stengthening;Right;20 reps;Knee straight;Limitations    Abduction Limitations Blue XTS      Modalities   Modalities Vasopneumatic;Electrical Stimulation      Electrical Stimulation   Electrical Stimulation Location R knee    Electrical Stimulation Action IFC    Electrical Stimulation Parameters 80-150 hz x10 min    Electrical Stimulation Goals Pain;Edema      Vasopneumatic   Number Minutes Vasopneumatic  10 minutes    Vasopnuematic Location  Knee    Vasopneumatic Pressure Low    Vasopneumatic Temperature  34/edema and pain                       PT Long Term Goals - 11/28/20 1146      PT LONG TERM  GOAL #1   Title Patient will be independent with HEP and its progression.    Time 6    Period Weeks    Status Partially Met      PT LONG TERM GOAL #2   Title Patient will demonstrate 5 degrees or less of right knee extension AROM to improve gait mechanics.    Time 6    Period Weeks    Status Achieved      PT LONG TERM GOAL #3   Title Patient will demonstrate 115+ degrees of right knee flexion AROM to improve functional tasks.    Baseline AROM 111 degrees 11/05/20    Time 6    Period Weeks    Status Achieved      PT LONG TERM GOAL #4   Title Patient will demonstrate 4/5 right knee MMT to improve stability during functional tasks.    Time 6    Period Weeks    Status On-going      PT LONG TERM GOAL #5   Title Patient will report ability to perform ADLs and home activities with right knee pain less than or equal to 3/10.    Time 6    Period Weeks    Status Achieved      PT LONG TERM GOAL #6   Title Patient will demonstrate reciprocating stair negotiation with one railing to safely enter and exit home.    Time 6    Period Weeks    Status Achieved   safe but 3-4/10 R knee pain descending 11/28/2020                Plan - 12/05/20 1210    Clinical Impression Statement Patient presented in clinic with reports of greater aching at night and pain in R knee. Patient able to tolerate light strengthening with continued reports of pain. PTA and Mali Applegate, MPT assessed R knee with no excessive redness, fever. Increased circular region of edema present in inferiomedial R knee with pin point pain with palpation. Patient encouraged to ice and massage as indicated. Normal modalities response noted following removal of the modalities.    Personal Factors and Comorbidities Age;Comorbidity 3+    Comorbidities R TKA 10/08/2020; HTN, COPD, osteopenia, history of L TKA    Examination-Activity Limitations Bathing;Locomotion  Level;Transfers;Stand;Stairs;Dressing;Hygiene/Grooming;Sit;Bed  Mobility    Examination-Participation Restrictions Driving;Meal Prep    Stability/Clinical Decision Making Stable/Uncomplicated    Rehab Potential Good    PT Frequency 3x / week    PT Duration 4 weeks    PT Treatment/Interventions ADLs/Self Care Home Management;Cryotherapy;Electrical Stimulation;Gait training;Stair training;Functional mobility training;Moist Heat;Therapeutic activities;Balance training;Therapeutic exercise;Neuromuscular re-education;Manual techniques;Passive range of motion;Patient/family education;Vasopneumatic Device;Taping    PT Next Visit Plan D/C summary and assess pain.    PT Home Exercise Plan Progressive to stationary bike.    Consulted and Agree with Plan of Care Patient           Patient will benefit from skilled therapeutic intervention in order to improve the following deficits and impairments:  Abnormal gait,Decreased activity tolerance,Decreased balance,Decreased strength,Decreased mobility,Decreased range of motion,Difficulty walking,Increased edema,Pain  Visit Diagnosis: Acute pain of right knee  Stiffness of right knee, not elsewhere classified  Localized edema  Muscle weakness (generalized)     Problem List Patient Active Problem List   Diagnosis Date Noted  . OA (osteoarthritis) of knee 10/08/2020  . Primary osteoarthritis of right knee 10/08/2020  . Morbid obesity ([place]) 03/08/2015  . Vitamin D deficiency 03/08/2015  . COPD (chronic obstructive pulmonary disease) ([place]) 08/11/2014  . Fall 12/19/2013  . Hyperlipidemia 06/29/2013  . Hypertension 06/29/2013  . GAD (generalized anxiety disorder) 06/29/2013  . Diverticulosis of colon without hemorrhage 06/29/2013  . GERD (gastroesophageal reflux disease) 06/29/2013  . Insomnia 06/29/2013  . Osteopenia 06/29/2013    Standley Brooking, PTA 12/05/2020, 12:20 PM  [facility] [address] [place], [place], [address] Phone: [phone]   Fax:   [phone]  Name: Renee Maldonado MRN: [id number] Date of Birth: 16-Jul-1946

## 2020-12-12 ENCOUNTER — Ambulatory Visit: Payer: Medicare HMO | Admitting: *Deleted

## 2020-12-12 ENCOUNTER — Other Ambulatory Visit: Payer: Self-pay

## 2020-12-12 DIAGNOSIS — M25561 Pain in right knee: Secondary | ICD-10-CM

## 2020-12-12 DIAGNOSIS — R600 Localized edema: Secondary | ICD-10-CM

## 2020-12-12 DIAGNOSIS — M6281 Muscle weakness (generalized): Secondary | ICD-10-CM | POA: Diagnosis not present

## 2020-12-12 DIAGNOSIS — M25661 Stiffness of right knee, not elsewhere classified: Secondary | ICD-10-CM | POA: Diagnosis not present

## 2020-12-12 NOTE — Therapy (Signed)
[facility] [place], [place], [address] Phone: [phone]   Fax:  [phone]  Physical Therapy Treatment PHYSICAL THERAPY DISCHARGE SUMMARY  Visits from Start of Care: 18  Current functional level related to goals / functional outcomes: See below   Remaining deficits: See goals   Education / Equipment: HEP Plan: Patient agrees to discharge.  Patient goals were met. Patient is being discharged due to meeting the stated rehab goals.  ?????  Gabriela Eves, PT, DPT 12/12/20   Patient Details  Name: Renee Maldonado MRN: [id number] Date of Birth: [birth year] Referring Provider (PT): Gaynelle Arabian, MD   Encounter Date: 12/12/2020   PT End of Session - 12/12/20 1156    Visit Number 18    Number of Visits 18    Date for PT Re-Evaluation 12/14/20    Authorization Type [employer] (Strum Modifier); FOTO 10th visit 51%, progress note every 10th visit    18th visit   35%limitation    PT Start Time 1115    PT Stop Time 1208    PT Time Calculation (min) 53 min    Activity Tolerance Patient tolerated treatment well    Behavior During Therapy WFL for tasks assessed/performed           Past Medical History:  Diagnosis Date  . Anxiety   . Arthritis   . Cancer [facility])    breast Right no chemo or radiation  on Arimidex  . COPD (chronic obstructive pulmonary disease) ([place])    on Breo  . GERD (gastroesophageal reflux disease)   . Humerus fracture 2007  . Hyperlipidemia   . Hypertension   . Insomnia     Past Surgical History:  Procedure Laterality Date  . BREAST LUMPECTOMY Right 02/2019  . BREAST SURGERY Right 01/2019   breast biopsy  . JOINT REPLACEMENT     left knee  . Left shoulder surgery     2007  . TOTAL KNEE ARTHROPLASTY     2012 left  . TOTAL KNEE ARTHROPLASTY Right 10/08/2020   Procedure: TOTAL KNEE ARTHROPLASTY;  Surgeon: Gaynelle Arabian, MD;  Location: [facility];  Service: Orthopedics;  Laterality: Right;   . TUBAL LIGATION      There were no vitals filed for this visit.                      [place] [facility] PT Treatment/Exercise - 12/12/20 0001      Knee/Hip Exercises: Aerobic   Nustep L1-4, seat 6 x15 min      Knee/Hip Exercises: Machines for Strengthening   Cybex Knee Extension 10# 3x10 reps    Cybex Knee Flexion 30# 3x10 reps      Knee/Hip Exercises: Standing   SLS RLE / LLE balance    Other Standing Knee Exercises tandem balance      Knee/Hip Exercises: Seated   Sit to Sand 15 reps      Modalities   Modalities Vasopneumatic;Electrical Stimulation      Electrical Stimulation   Electrical Stimulation Location R knee    Electrical Stimulation Action IFC    Electrical Stimulation Parameters 80-'150hz'  x 15 mins    Electrical Stimulation Goals Pain;Edema      Vasopneumatic   Number Minutes Vasopneumatic  15 minutes    Vasopnuematic Location  Knee    Vasopneumatic Pressure Low    Vasopneumatic Temperature  34/edema and pain  PT Long Term Goals - 12/12/20 1145      PT LONG TERM GOAL #1   Title Patient will be independent with HEP and its progression.    Time 6    Period Weeks    Status Achieved      PT LONG TERM GOAL #2   Title Patient will demonstrate 5 degrees or less of right knee extension AROM to improve gait mechanics.    Time 6    Period Weeks    Status Achieved      PT LONG TERM GOAL #3   Title Patient will demonstrate 115+ degrees of right knee flexion AROM to improve functional tasks.    Time 6    Period Weeks    Status Achieved      PT LONG TERM GOAL #4   Title Patient will demonstrate 4/5 right knee MMT to improve stability during functional tasks.    Time 6    Period Weeks    Status Achieved      PT LONG TERM GOAL #5   Title Patient will report ability to perform ADLs and home activities with right knee pain less than or equal to 3/10.    Time 6    Period Weeks    Status Achieved      PT LONG  TERM GOAL #6   Title Patient will demonstrate reciprocating stair negotiation with one railing to safely enter and exit home.    Time 6    Period Weeks    Status Achieved                 Plan - 12/12/20 1211    Clinical Impression Statement Pt arrived today doing fairly well and reports less pain with knee fat pad area today. HEP reviewed with added balance exs and tolerated well. Pt has met all LTGs and will be DC to HEP. FOTO 35% limited    Personal Factors and Comorbidities Age;Comorbidity 3+    Comorbidities R TKA 10/08/2020; HTN, COPD, osteopenia, history of L TKA    Examination-Activity Limitations Bathing;Locomotion Level;Transfers;Stand;Stairs;Dressing;Hygiene/Grooming;Sit;Bed Mobility    Examination-Participation Restrictions Driving;Meal Prep    Stability/Clinical Decision Making Stable/Uncomplicated    PT Treatment/Interventions ADLs/Self Care Home Management;Cryotherapy;Electrical Stimulation;Gait training;Stair training;Functional mobility training;Moist Heat;Therapeutic activities;Balance training;Therapeutic exercise;Neuromuscular re-education;Manual techniques;Passive range of motion;Patient/family education;Vasopneumatic Device;Taping    PT Next Visit Plan D/C to HEP           Patient will benefit from skilled therapeutic intervention in order to improve the following deficits and impairments:  Abnormal gait,Decreased activity tolerance,Decreased balance,Decreased strength,Decreased mobility,Decreased range of motion,Difficulty walking,Increased edema,Pain  Visit Diagnosis: Acute pain of right knee  Stiffness of right knee, not elsewhere classified  Localized edema  Muscle weakness (generalized)     Problem List Patient Active Problem List   Diagnosis Date Noted  . OA (osteoarthritis) of knee 10/08/2020  . Primary osteoarthritis of right knee 10/08/2020  . Morbid obesity ([place]) 03/08/2015  . Vitamin D deficiency 03/08/2015  . COPD (chronic obstructive  pulmonary disease) ([place]) 08/11/2014  . Fall 12/19/2013  . Hyperlipidemia 06/29/2013  . Hypertension 06/29/2013  . GAD (generalized anxiety disorder) 06/29/2013  . Diverticulosis of colon without hemorrhage 06/29/2013  . GERD (gastroesophageal reflux disease) 06/29/2013  . Insomnia 06/29/2013  . Osteopenia 06/29/2013    Marketta Valadez,CHRIS, PTA 12/12/2020, 12:15 PM  [facility] [place], [place], [address] Phone: [phone]   Fax:  [phone]  Name: Maheen Cwikla MRN: [id number] Date of  Birth: 1946-08-22

## 2020-12-13 ENCOUNTER — Encounter: Payer: Self-pay | Admitting: Family Medicine

## 2020-12-13 ENCOUNTER — Ambulatory Visit (INDEPENDENT_AMBULATORY_CARE_PROVIDER_SITE_OTHER): Payer: Medicare HMO | Admitting: Family Medicine

## 2020-12-13 VITALS — BP 127/73 | HR 75 | Temp 97.5°F | Ht 62.0 in | Wt 181.4 lb

## 2020-12-13 DIAGNOSIS — R69 Illness, unspecified: Secondary | ICD-10-CM | POA: Diagnosis not present

## 2020-12-13 DIAGNOSIS — I10 Essential (primary) hypertension: Secondary | ICD-10-CM | POA: Diagnosis not present

## 2020-12-13 DIAGNOSIS — E559 Vitamin D deficiency, unspecified: Secondary | ICD-10-CM

## 2020-12-13 DIAGNOSIS — J439 Emphysema, unspecified: Secondary | ICD-10-CM

## 2020-12-13 DIAGNOSIS — F411 Generalized anxiety disorder: Secondary | ICD-10-CM | POA: Diagnosis not present

## 2020-12-13 DIAGNOSIS — E785 Hyperlipidemia, unspecified: Secondary | ICD-10-CM | POA: Diagnosis not present

## 2020-12-13 DIAGNOSIS — G4709 Other insomnia: Secondary | ICD-10-CM

## 2020-12-13 DIAGNOSIS — M1711 Unilateral primary osteoarthritis, right knee: Secondary | ICD-10-CM

## 2020-12-13 MED ORDER — FAMOTIDINE 40 MG PO TABS: 40.0000 mg | ORAL_TABLET | Freq: Every day | ORAL | 1 refills | Status: DC

## 2020-12-13 MED ORDER — ROSUVASTATIN CALCIUM 20 MG PO TABS: 20.0000 mg | ORAL_TABLET | Freq: Every day | ORAL | 1 refills | Status: DC

## 2020-12-13 MED ORDER — ALBUTEROL SULFATE HFA 108 (90 BASE) MCG/ACT IN AERS
INHALATION_SPRAY | RESPIRATORY_TRACT | 5 refills | Status: DC
Start: 2020-12-13 — End: 2024-02-15

## 2020-12-13 MED ORDER — HYDROCHLOROTHIAZIDE 25 MG PO TABS: 25.0000 mg | ORAL_TABLET | Freq: Every day | ORAL | 1 refills | Status: DC

## 2020-12-13 MED ORDER — BREO ELLIPTA 100-25 MCG/INH IN AEPB: INHALATION_SPRAY | RESPIRATORY_TRACT | 3 refills | Status: DC

## 2020-12-13 MED ORDER — TRAZODONE HCL 50 MG PO TABS: 50.0000 mg | ORAL_TABLET | Freq: Every day | ORAL | 1 refills | Status: DC

## 2020-12-13 MED ORDER — AMLODIPINE BESYLATE 5 MG PO TABS: 5.0000 mg | ORAL_TABLET | Freq: Every day | ORAL | 1 refills | Status: DC

## 2020-12-13 MED ORDER — VALACYCLOVIR HCL 500 MG PO TABS
ORAL_TABLET | ORAL | 2 refills | Status: DC
Start: 2020-12-13 — End: 2022-11-24

## 2020-12-13 NOTE — Progress Notes (Signed)
Subjective:  Patient ID: Renee Maldonado, female    DOB: [birth year]  Age: 75 y.o. MRN: [id number]  CC: Medical Management of Chronic Issues   HPI Renee Maldonado presents for follow-up of hypertension. Patient has no history of headache chest pain or shortness of breath or recent cough. Patient also denies symptoms of TIA such as numbness weakness lateralizing. Patient checks  blood pressure at home and has not had any elevated readings recently. Patient denies side effects from his medication. States taking it regularly. Patient in for follow-up of elevated cholesterol. Doing well without complaints on current medication. Denies side effects of statin including myalgia and arthralgia and nausea. Also in today for liver function testing. Currently no chest pain, shortness of breath or other cardiovascular related symptoms noted.  Continuing to have right knee pain.  Not ready to have a steroid injection.  Under care of orthopedics.  Patient is doing well with her emphysema currently.  She continues to use Breo.  Has not needed the rescue inhaler recently.  Depression screen [facility] 2/9 12/13/2020 08/01/2020 08/01/2020  Decreased Interest 0 0 0  Down, Depressed, Hopeless 0 0 0  PHQ - 2 Score 0 0 0    History Renee Maldonado has a past medical history of Anxiety, Arthritis, Cancer ([place]), COPD (chronic obstructive pulmonary disease) ([place]), GERD (gastroesophageal reflux disease), Humerus fracture (2007), Hyperlipidemia, Hypertension, Insomnia, and OA (osteoarthritis) of knee (10/08/2020).   She has a past surgical history that includes Total knee arthroplasty; Left shoulder surgery; Joint replacement; Tubal ligation; Breast surgery (Right, 01/2019); Breast lumpectomy (Right, 02/2019); and Total knee arthroplasty (Right, 10/08/2020).   Her family history includes Alzheimer's disease in her mother; Cancer in her brother; Diabetes in her mother; Hypertension in her father; Stroke in her father.She reports that she has  been smoking cigarettes. She has a 10.00 pack-year smoking history. She has never used smokeless tobacco. She reports current alcohol use. She reports that she does not use drugs.    ROS Review of Systems  Constitutional: Negative.   HENT: Negative.   Eyes: Negative for visual disturbance.  Respiratory: Negative for shortness of breath.   Cardiovascular: Negative for chest pain.  Gastrointestinal: Negative for abdominal pain.  Musculoskeletal: Positive for arthralgias.    Objective:  BP 127/73   Pulse 75   Temp (!) 97.5 F (36.4 C) (Temporal)   Ht '5\' 2"'  (1.575 m)   Wt 181 lb 6.4 oz (82.3 kg)   BMI 33.18 kg/m   BP Readings from Last 3 Encounters:  12/13/20 127/73  10/09/20 127/64  10/02/20 (!) 160/65    Wt Readings from Last 3 Encounters:  12/13/20 181 lb 6.4 oz (82.3 kg)  10/08/20 190 lb (86.2 kg)  10/02/20 190 lb (86.2 kg)     Physical Exam Constitutional:      General: She is not in acute distress.    Appearance: She is well-developed.  HENT:     Head: Normocephalic and atraumatic.  Eyes:     Conjunctiva/sclera: Conjunctivae normal.     Pupils: Pupils are equal, round, and reactive to light.  Neck:     Thyroid: No thyromegaly.  Cardiovascular:     Rate and Rhythm: Normal rate and regular rhythm.     Heart sounds: Normal heart sounds. No murmur heard.   Pulmonary:     Effort: Pulmonary effort is normal. No respiratory distress.     Breath sounds: Normal breath sounds. No wheezing or rales.  Abdominal:     General: Bowel  sounds are normal. There is no distension.     Palpations: Abdomen is soft.     Tenderness: There is no abdominal tenderness.  Musculoskeletal:        General: Normal range of motion.     Cervical back: Normal range of motion and neck supple.  Lymphadenopathy:     Cervical: No cervical adenopathy.  Skin:    General: Skin is warm and dry.  Neurological:     Mental Status: She is alert and oriented to person, place, and time.   Psychiatric:        Behavior: Behavior normal.        Thought Content: Thought content normal.        Judgment: Judgment normal.       Assessment & Plan:   Renee Maldonado was seen today for medical management of chronic issues.  Diagnoses and all orders for this visit:  Pulmonary emphysema, unspecified emphysema type (HCC) -     fluticasone furoate-vilanterol (BREO ELLIPTA) 100-25 MCG/INH AEPB; INHALE 1 PUFF ONCE DAILY -     albuterol (VENTOLIN HFA) 108 (90 Base) MCG/ACT inhaler; 2 puffs in lungs every 6 hours as needed for wheezing/short of breath. -     CBC with Differential/Platelet -     CMP14+EGFR -     Lipid panel  Other insomnia -     traZODone (DESYREL) 50 MG tablet; Take 1 tablet (50 mg total) by mouth at bedtime. -     CBC with Differential/Platelet -     CMP14+EGFR -     Lipid panel  Hyperlipidemia, unspecified hyperlipidemia type -     rosuvastatin (CRESTOR) 20 MG tablet; Take 1 tablet (20 mg total) by mouth daily. -     CBC with Differential/Platelet -     CMP14+EGFR -     Lipid panel  GAD (generalized anxiety disorder) -     CBC with Differential/Platelet -     CMP14+EGFR -     Lipid panel  Primary hypertension -     hydrochlorothiazide (HYDRODIURIL) 25 MG tablet; Take 1 tablet (25 mg total) by mouth daily. -     amLODipine (NORVASC) 5 MG tablet; Take 1 tablet (5 mg total) by mouth daily. -     CBC with Differential/Platelet -     CMP14+EGFR -     Lipid panel  Primary osteoarthritis of right knee -     CBC with Differential/Platelet -     CMP14+EGFR -     Lipid panel  Vitamin D deficiency -     VITAMIN D 25 Hydroxy (Vit-D Deficiency, Fractures)  Other orders -     valACYclovir (VALTREX) 500 MG tablet; TAKE (1) TABLET DAILY AS NEEDED. -     famotidine (PEPCID) 40 MG tablet; Take 1 tablet (40 mg total) by mouth daily.       I have discontinued Renee Maldonado's fluticasone, oxyCODONE, traMADol, rivaroxaban, and gabapentin. I have also changed her  traZODone, hydrochlorothiazide, famotidine, and amLODipine. Additionally, I am having her maintain her Restasis, acetaminophen, anastrozole, clindamycin, Chlor-Trimeton, methocarbamol, valACYclovir, rosuvastatin, Breo Ellipta, and albuterol.  Allergies as of 12/13/2020      Reactions   Benadryl [diphenhydramine Hcl]    Penicillins    Had a rash around 20 years ago but unsure if it come from medicine.  Tolerated Cephalosporin Date: 10/09/20.      Medication List       Accurate as of December 13, 2020 11:59 PM. If you have any questions, ask  your nurse or doctor.        STOP taking these medications   fluticasone 50 MCG/ACT nasal spray Commonly known as: FLONASE Stopped by: Claretta Fraise, MD   gabapentin 300 MG capsule Commonly known as: NEURONTIN Stopped by: Claretta Fraise, MD   oxyCODONE 5 MG immediate release tablet Commonly known as: Oxy IR/ROXICODONE Stopped by: Claretta Fraise, MD   rivaroxaban 10 MG Tabs tablet Commonly known as: XARELTO Stopped by: Claretta Fraise, MD   traMADol 50 MG tablet Commonly known as: ULTRAM Stopped by: Claretta Fraise, MD     TAKE these medications   acetaminophen 500 MG tablet Commonly known as: TYLENOL Take 500 mg by mouth every 6 (six) hours as needed for moderate pain.   albuterol 108 (90 Base) MCG/ACT inhaler Commonly known as: VENTOLIN HFA 2 puffs in lungs every 6 hours as needed for wheezing/short of breath. What changed: See the new instructions.   amLODipine 5 MG tablet Commonly known as: NORVASC Take 1 tablet (5 mg total) by mouth daily.   anastrozole 1 MG tablet Commonly known as: ARIMIDEX Take 1 mg by mouth daily.   Breo Ellipta 100-25 MCG/INH Aepb Generic drug: fluticasone furoate-vilanterol INHALE 1 PUFF ONCE DAILY What changed:   how much to take  how to take this  when to take this  additional instructions   Chlor-Trimeton 4 MG tablet Generic drug: chlorpheniramine Take 4 mg by mouth in the morning and at  bedtime.   clindamycin 300 MG capsule Commonly known as: CLEOCIN Take 300 mg by mouth See admin instructions. Take 600 mg by mouth one hour prior to dental appointment.   famotidine 40 MG tablet Commonly known as: PEPCID Take 1 tablet (40 mg total) by mouth daily.   hydrochlorothiazide 25 MG tablet Commonly known as: HYDRODIURIL Take 1 tablet (25 mg total) by mouth daily.   methocarbamol 500 MG tablet Commonly known as: ROBAXIN Take 1 tablet (500 mg total) by mouth every 6 (six) hours as needed for muscle spasms.   Restasis 0.05 % ophthalmic emulsion Generic drug: cycloSPORINE Place 1 drop into both eyes 2 (two) times daily as needed (dry eyes).   rosuvastatin 20 MG tablet Commonly known as: CRESTOR Take 1 tablet (20 mg total) by mouth daily.   traZODone 50 MG tablet Commonly known as: DESYREL Take 1 tablet (50 mg total) by mouth at bedtime.   valACYclovir 500 MG tablet Commonly known as: VALTREX TAKE (1) TABLET DAILY AS NEEDED. What changed:   how much to take  how to take this  when to take this  reasons to take this  additional instructions        Follow-up: Return in about 3 months (around 03/13/2021).  Claretta Fraise, M.D.

## 2020-12-14 LAB — CBC WITH DIFFERENTIAL/PLATELET
Basophils Absolute: 0 10*3/uL (ref 0.0–0.2)
Basos: 1 %
EOS (ABSOLUTE): 0.2 10*3/uL (ref 0.0–0.4)
Eos: 4 %
Hematocrit: 37.4 % (ref 34.0–46.6)
Hemoglobin: 12 g/dL (ref 11.1–15.9)
Immature Grans (Abs): 0 10*3/uL (ref 0.0–0.1)
Immature Granulocytes: 0 %
Lymphocytes Absolute: 1.1 10*3/uL (ref 0.7–3.1)
Lymphs: 20 %
MCH: 26.2 pg — ABNORMAL LOW (ref 26.6–33.0)
MCHC: 32.1 g/dL (ref 31.5–35.7)
MCV: 82 fL (ref 79–97)
Monocytes Absolute: 0.5 10*3/uL (ref 0.1–0.9)
Monocytes: 9 %
Neutrophils Absolute: 3.8 10*3/uL (ref 1.4–7.0)
Neutrophils: 66 %
Platelets: 347 10*3/uL (ref 150–450)
RBC: 4.58 x10E6/uL (ref 3.77–5.28)
RDW: 13.7 % (ref 11.7–15.4)
WBC: 5.7 10*3/uL (ref 3.4–10.8)

## 2020-12-14 LAB — CMP14+EGFR
ALT: 15 IU/L (ref 0–32)
AST: 16 IU/L (ref 0–40)
Albumin/Globulin Ratio: 2.1 (ref 1.2–2.2)
Albumin: 4.6 g/dL (ref 3.7–4.7)
Alkaline Phosphatase: 96 IU/L (ref 44–121)
BUN/Creatinine Ratio: 15 (ref 12–28)
BUN: 19 mg/dL (ref 8–27)
Bilirubin Total: 0.4 mg/dL (ref 0.0–1.2)
CO2: 29 mmol/L (ref 20–29)
Calcium: 10.4 mg/dL — ABNORMAL HIGH (ref 8.7–10.3)
Chloride: 97 mmol/L (ref 96–106)
Creatinine, Ser: 1.23 mg/dL — ABNORMAL HIGH (ref 0.57–1.00)
GFR calc Af Amer: 50 mL/min/{1.73_m2} — ABNORMAL LOW (ref >59)
GFR calc non Af Amer: 43 mL/min/{1.73_m2} — ABNORMAL LOW (ref >59)
Globulin, Total: 2.2 g/dL (ref 1.5–4.5)
Glucose: 120 mg/dL — ABNORMAL HIGH (ref 65–99)
Potassium: 4.7 mmol/L (ref 3.5–5.2)
Sodium: 142 mmol/L (ref 134–144)
Total Protein: 6.8 g/dL (ref 6.0–8.5)

## 2020-12-14 LAB — LIPID PANEL
Chol/HDL Ratio: 3.4 ratio (ref 0.0–4.4)
Cholesterol, Total: 115 mg/dL (ref 100–199)
HDL: 34 mg/dL — ABNORMAL LOW (ref >39)
LDL Chol Calc (NIH): 50 mg/dL (ref 0–99)
Triglycerides: 184 mg/dL — ABNORMAL HIGH (ref 0–149)
VLDL Cholesterol Cal: 31 mg/dL (ref 5–40)

## 2020-12-14 LAB — VITAMIN D 25 HYDROXY (VIT D DEFICIENCY, FRACTURES): Vit D, 25-Hydroxy: 42.2 ng/mL (ref 30.0–100.0)

## 2020-12-14 NOTE — Progress Notes (Signed)
Hello Biridiana,  Your lab result is normal and/or stable.Some minor variations that are not significant are commonly marked abnormal, but do not represent any medical problem for you.  Best regards, Claretta Fraise, M.D.

## 2020-12-22 ENCOUNTER — Other Ambulatory Visit: Payer: Self-pay | Admitting: Family Medicine

## 2020-12-22 DIAGNOSIS — I10 Essential (primary) hypertension: Secondary | ICD-10-CM

## 2020-12-26 ENCOUNTER — Encounter: Payer: Self-pay | Admitting: Family Medicine

## 2020-12-26 ENCOUNTER — Ambulatory Visit (INDEPENDENT_AMBULATORY_CARE_PROVIDER_SITE_OTHER): Payer: Medicare HMO | Admitting: Family Medicine

## 2020-12-26 VITALS — BP 121/74 | HR 86 | Temp 100.5°F

## 2020-12-26 DIAGNOSIS — Z20822 Contact with and (suspected) exposure to covid-19: Secondary | ICD-10-CM

## 2020-12-26 NOTE — Progress Notes (Signed)
Established Patient Office Visit  Subjective:  Patient ID: Renee Maldonado, female    DOB: 1946/10/31  Age: 75 y.o. MRN: [id number]  CC:  Chief Complaint  Patient presents with  . Fever  . Chills  . Fatigue  . Nasal Congestion  . Headache    HPI Renee Maldonado presents for fever. She noticed a fever and chills last night with max temperature of 100.5. She also reports a headache and runny nose. She denies chest pain, dizziness, shortness of breath, or cough. She has had her Covid vaccines and her booster. She has a history of mild COPD and is using breo with good control. She does have an albuterol inhaler as well. She has been taking tylenol for her headache and fever with good relief.    Past Medical History:  Diagnosis Date  . Anxiety   . Arthritis   . Cancer [facility])    breast Right no chemo or radiation  on Arimidex  . COPD (chronic obstructive pulmonary disease) ([place])    on Breo  . GERD (gastroesophageal reflux disease)   . Humerus fracture 2007  . Hyperlipidemia   . Hypertension   . Insomnia   . OA (osteoarthritis) of knee 10/08/2020    Past Surgical History:  Procedure Laterality Date  . BREAST LUMPECTOMY Right 02/2019  . BREAST SURGERY Right 01/2019   breast biopsy  . JOINT REPLACEMENT     left knee  . Left shoulder surgery     2007  . TOTAL KNEE ARTHROPLASTY     2012 left  . TOTAL KNEE ARTHROPLASTY Right 10/08/2020   Procedure: TOTAL KNEE ARTHROPLASTY;  Surgeon: Gaynelle Arabian, MD;  Location: [facility];  Service: Orthopedics;  Laterality: Right;  . TUBAL LIGATION      Family History  Problem Relation Age of Onset  . Alzheimer's disease Mother   . Diabetes Mother   . Stroke Father   . Hypertension Father   . Cancer Brother     Social History   Socioeconomic History  . Marital status: Married    Spouse name: Jori Moll  . Number of children: 3  . Years of education: 16  . Highest education level: Bachelor's degree (e.g., BA, AB, BS)  Occupational History   . Occupation: retired  Tobacco Use  . Smoking status: Current Every Day Smoker    Packs/day: 0.25    Years: 40.00    Pack years: 10.00    Types: Cigarettes    Last attempt to quit: 09/17/2014    Years since quitting: 6.2  . Smokeless tobacco: Never Used  . Tobacco comment: smokes 3 a day  Vaping Use  . Vaping Use: Former  . Start date: 07/18/2018  . Quit date: 07/19/2019  Substance and Sexual Activity  . Alcohol use: Yes    Alcohol/week: 0.0 standard drinks    Comment: rarely-    . Drug use: No  . Sexual activity: Not Currently  Other Topics Concern  . Not on file  Social History Narrative  . Not on file   Social Determinants of Health   Financial Resource Strain: Low Risk   . Difficulty of Paying Living Expenses: Not hard at all  Food Insecurity: No Food Insecurity  . Worried About Charity fundraiser in the Last Year: Never true  . Ran Out of Food in the Last Year: Never true  Transportation Needs: No Transportation Needs  . Lack of Transportation (Medical): No  . Lack of Transportation (Non-Medical): No  Physical  Activity: Inactive  . Days of Exercise per Week: 0 days  . Minutes of Exercise per Session: 0 min  Stress: No Stress Concern Present  . Feeling of Stress : Not at all  Social Connections: Socially Integrated  . Frequency of Communication with Friends and Family: More than three times a week  . Frequency of Social Gatherings with Friends and Family: More than three times a week  . Attends Religious Services: More than 4 times per year  . Active Member of Clubs or Organizations: Yes  . Attends Archivist Meetings: More than 4 times per year  . Marital Status: Married  Human resources officer Violence: Not At Risk  . Fear of Current or Ex-Partner: No  . Emotionally Abused: No  . Physically Abused: No  . Sexually Abused: No    Outpatient Medications Prior to Visit  Medication Sig Dispense Refill  . acetaminophen (TYLENOL) 500 MG tablet Take 500 mg by  mouth every 6 (six) hours as needed for moderate pain.     Marland Kitchen albuterol (VENTOLIN HFA) 108 (90 Base) MCG/ACT inhaler 2 puffs in lungs every 6 hours as needed for wheezing/short of breath. 8.5 g 5  . amLODipine (NORVASC) 5 MG tablet Take 1 tablet (5 mg total) by mouth daily. 90 tablet 1  . anastrozole (ARIMIDEX) 1 MG tablet Take 1 mg by mouth daily.     . chlorpheniramine (CHLOR-TRIMETON) 4 MG tablet Take 4 mg by mouth in the morning and at bedtime.     . clindamycin (CLEOCIN) 300 MG capsule Take 300 mg by mouth See admin instructions. Take 600 mg by mouth one hour prior to dental appointment.    . famotidine (PEPCID) 40 MG tablet Take 1 tablet (40 mg total) by mouth daily. 90 tablet 1  . fluticasone furoate-vilanterol (BREO ELLIPTA) 100-25 MCG/INH AEPB INHALE 1 PUFF ONCE DAILY 90 each 3  . hydrochlorothiazide (HYDRODIURIL) 25 MG tablet TAKE 1 TABLET DAILY 90 tablet 0  . methocarbamol (ROBAXIN) 500 MG tablet Take 1 tablet (500 mg total) by mouth every 6 (six) hours as needed for muscle spasms. 40 tablet 0  . RESTASIS 0.05 % ophthalmic emulsion Place 1 drop into both eyes 2 (two) times daily as needed (dry eyes).     . rosuvastatin (CRESTOR) 20 MG tablet Take 1 tablet (20 mg total) by mouth daily. 90 tablet 1  . traZODone (DESYREL) 50 MG tablet Take 1 tablet (50 mg total) by mouth at bedtime. 90 tablet 1  . valACYclovir (VALTREX) 500 MG tablet TAKE (1) TABLET DAILY AS NEEDED. 30 tablet 2   No facility-administered medications prior to visit.    Allergies  Allergen Reactions  . Benadryl [Diphenhydramine Hcl]   . Penicillins     Had a rash around 20 years ago but unsure if it come from medicine.   Tolerated Cephalosporin Date: 10/09/20.      ROS Review of Systems As per HPI.    Objective:    Physical Exam Vitals and nursing note reviewed.  Constitutional:      General: She is not in acute distress.    Appearance: She is not ill-appearing, toxic-appearing or diaphoretic.  HENT:      Head: Normocephalic and atraumatic.     Right Ear: Tympanic membrane, ear canal and external ear normal.     Left Ear: Tympanic membrane and external ear normal.     Mouth/Throat:     Mouth: Mucous membranes are moist.     Pharynx: Oropharynx is  clear.  Eyes:     Conjunctiva/sclera: Conjunctivae normal.     Pupils: Pupils are equal, round, and reactive to light.  Cardiovascular:     Rate and Rhythm: Normal rate and regular rhythm.     Heart sounds: Normal heart sounds. No murmur heard.   Pulmonary:     Effort: Pulmonary effort is normal. No respiratory distress.     Breath sounds: Normal breath sounds. No stridor. No wheezing, rhonchi or rales.  Chest:     Chest wall: No tenderness.  Musculoskeletal:     Cervical back: Neck supple.  Lymphadenopathy:     Cervical: No cervical adenopathy.  Skin:    General: Skin is warm and dry.  Neurological:     Mental Status: She is alert and oriented to person, place, and time.     Cranial Nerves: No facial asymmetry.  Psychiatric:        Mood and Affect: Mood normal.        Behavior: Behavior normal.     BP 121/74   Pulse 86   Temp (!) 100.5 F (38.1 C)   SpO2 97%  Wt Readings from Last 3 Encounters:  12/13/20 181 lb 6.4 oz (82.3 kg)  10/08/20 190 lb (86.2 kg)  10/02/20 190 lb (86.2 kg)     Health Maintenance Due  Topic Date Due  . MAMMOGRAM  02/09/2020  . DEXA SCAN  02/09/2020  . COVID-19 Vaccine (3 - Inadvertent risk 4-dose series) 02/21/2020    There are no preventive care reminders to display for this patient.  Lab Results  Component Value Date   TSH 4.300 07/01/2019   Lab Results  Component Value Date   WBC 5.7 12/13/2020   HGB 12.0 12/13/2020   HCT 37.4 12/13/2020   MCV 82 12/13/2020   PLT 347 12/13/2020   Lab Results  Component Value Date   NA 142 12/13/2020   K 4.7 12/13/2020   CO2 29 12/13/2020   GLUCOSE 120 (H) 12/13/2020   BUN 19 12/13/2020   CREATININE 1.23 (H) 12/13/2020   BILITOT 0.4  12/13/2020   ALKPHOS 96 12/13/2020   AST 16 12/13/2020   ALT 15 12/13/2020   PROT 6.8 12/13/2020   ALBUMIN 4.6 12/13/2020   CALCIUM 10.4 (H) 12/13/2020   ANIONGAP 12 10/09/2020   Lab Results  Component Value Date   CHOL 115 12/13/2020   Lab Results  Component Value Date   HDL 34 (L) 12/13/2020   Lab Results  Component Value Date   LDLCALC 50 12/13/2020   Lab Results  Component Value Date   TRIG 184 (H) 12/13/2020   Lab Results  Component Value Date   CHOLHDL 3.4 12/13/2020   Lab Results  Component Value Date   HGBA1C 5.5 10/22/2016      Assessment & Plan:   Renee Maldonado was seen today for fever, chills, nasal congestion and headache.  Diagnoses and all orders for this visit:  Suspected COVID-19 virus infection Quarantine pending results. Mild symptoms currently. No SOB or chest pain. Mucinex and nasal saline for congestion. Stay well hydrated, rest. Tylenol for headache and fever. Albuterol inhaler if needed. Discussed referral to outpatient treatment if positive and within 5 days of symptom start.Return to office for new or worsening symptoms. -     Novel Coronavirus, NAA ([facility])   Follow-up: Return if symptoms worsen or fail to improve.   The patient indicates understanding of these issues and agrees with the plan.  Gwenlyn Perking, FNP

## 2020-12-27 ENCOUNTER — Telehealth: Payer: Self-pay

## 2020-12-27 DIAGNOSIS — N3001 Acute cystitis with hematuria: Secondary | ICD-10-CM | POA: Diagnosis not present

## 2020-12-27 DIAGNOSIS — R509 Fever, unspecified: Secondary | ICD-10-CM | POA: Diagnosis not present

## 2020-12-27 LAB — NOVEL CORONAVIRUS, NAA: SARS-CoV-2, NAA: NOT DETECTED

## 2020-12-27 LAB — SARS-COV-2, NAA 2 DAY TAT

## 2020-12-27 NOTE — Telephone Encounter (Signed)
Pt is calling back--she is aware of covid results. She is asking if she is neg for covid, why did she have a fever. Please call back

## 2020-12-28 NOTE — Telephone Encounter (Signed)
Patient states that she was seen in Urgent Care yesterday 12/27/20 and was diagnosed with a kidney infection and given medication.

## 2021-02-22 DIAGNOSIS — Z1211 Encounter for screening for malignant neoplasm of colon: Secondary | ICD-10-CM | POA: Diagnosis not present

## 2021-02-22 DIAGNOSIS — K648 Other hemorrhoids: Secondary | ICD-10-CM | POA: Diagnosis not present

## 2021-02-25 ENCOUNTER — Other Ambulatory Visit: Payer: Self-pay | Admitting: Family Medicine

## 2021-03-01 DIAGNOSIS — E785 Hyperlipidemia, unspecified: Secondary | ICD-10-CM | POA: Diagnosis not present

## 2021-03-01 DIAGNOSIS — R69 Illness, unspecified: Secondary | ICD-10-CM | POA: Diagnosis not present

## 2021-03-01 DIAGNOSIS — J449 Chronic obstructive pulmonary disease, unspecified: Secondary | ICD-10-CM | POA: Diagnosis not present

## 2021-03-01 DIAGNOSIS — J309 Allergic rhinitis, unspecified: Secondary | ICD-10-CM | POA: Diagnosis not present

## 2021-03-01 DIAGNOSIS — G4700 Insomnia, unspecified: Secondary | ICD-10-CM | POA: Diagnosis not present

## 2021-03-01 DIAGNOSIS — G8929 Other chronic pain: Secondary | ICD-10-CM | POA: Diagnosis not present

## 2021-03-01 DIAGNOSIS — H04129 Dry eye syndrome of unspecified lacrimal gland: Secondary | ICD-10-CM | POA: Diagnosis not present

## 2021-03-01 DIAGNOSIS — E669 Obesity, unspecified: Secondary | ICD-10-CM | POA: Diagnosis not present

## 2021-03-01 DIAGNOSIS — I129 Hypertensive chronic kidney disease with stage 1 through stage 4 chronic kidney disease, or unspecified chronic kidney disease: Secondary | ICD-10-CM | POA: Diagnosis not present

## 2021-03-01 DIAGNOSIS — C50919 Malignant neoplasm of unspecified site of unspecified female breast: Secondary | ICD-10-CM | POA: Diagnosis not present

## 2021-03-13 ENCOUNTER — Ambulatory Visit: Payer: Medicare HMO | Admitting: Family Medicine

## 2021-03-19 ENCOUNTER — Other Ambulatory Visit: Payer: Self-pay

## 2021-03-19 ENCOUNTER — Ambulatory Visit (INDEPENDENT_AMBULATORY_CARE_PROVIDER_SITE_OTHER): Payer: Medicare HMO | Admitting: Family Medicine

## 2021-03-19 ENCOUNTER — Encounter: Payer: Self-pay | Admitting: Family Medicine

## 2021-03-19 VITALS — BP 129/71 | HR 76 | Temp 97.7°F | Ht 62.0 in | Wt 189.2 lb

## 2021-03-19 DIAGNOSIS — E782 Mixed hyperlipidemia: Secondary | ICD-10-CM | POA: Diagnosis not present

## 2021-03-19 DIAGNOSIS — I10 Essential (primary) hypertension: Secondary | ICD-10-CM | POA: Diagnosis not present

## 2021-03-19 DIAGNOSIS — J439 Emphysema, unspecified: Secondary | ICD-10-CM

## 2021-03-19 LAB — CMP14+EGFR
ALT: 23 IU/L (ref 0–32)
AST: 15 IU/L (ref 0–40)
Albumin/Globulin Ratio: 2.3 — ABNORMAL HIGH (ref 1.2–2.2)
Albumin: 4.6 g/dL (ref 3.7–4.7)
Alkaline Phosphatase: 87 IU/L (ref 44–121)
BUN/Creatinine Ratio: 21 (ref 12–28)
BUN: 22 mg/dL (ref 8–27)
Bilirubin Total: 0.5 mg/dL (ref 0.0–1.2)
CO2: 26 mmol/L (ref 20–29)
Calcium: 10.1 mg/dL (ref 8.7–10.3)
Chloride: 101 mmol/L (ref 96–106)
Creatinine, Ser: 1.04 mg/dL — ABNORMAL HIGH (ref 0.57–1.00)
Globulin, Total: 2 g/dL (ref 1.5–4.5)
Glucose: 113 mg/dL — ABNORMAL HIGH (ref 65–99)
Potassium: 4.2 mmol/L (ref 3.5–5.2)
Sodium: 142 mmol/L (ref 134–144)
Total Protein: 6.6 g/dL (ref 6.0–8.5)
eGFR: 56 mL/min/{1.73_m2} — ABNORMAL LOW (ref >59)

## 2021-03-19 LAB — CBC WITH DIFFERENTIAL/PLATELET
Basophils Absolute: 0 10*3/uL (ref 0.0–0.2)
Basos: 0 %
EOS (ABSOLUTE): 0.1 10*3/uL (ref 0.0–0.4)
Eos: 2 %
Hematocrit: 35.5 % (ref 34.0–46.6)
Hemoglobin: 11.3 g/dL (ref 11.1–15.9)
Immature Grans (Abs): 0 10*3/uL (ref 0.0–0.1)
Immature Granulocytes: 0 %
Lymphocytes Absolute: 1.1 10*3/uL (ref 0.7–3.1)
Lymphs: 20 %
MCH: 26.2 pg — ABNORMAL LOW (ref 26.6–33.0)
MCHC: 31.8 g/dL (ref 31.5–35.7)
MCV: 82 fL (ref 79–97)
Monocytes Absolute: 0.5 10*3/uL (ref 0.1–0.9)
Monocytes: 8 %
Neutrophils Absolute: 3.9 10*3/uL (ref 1.4–7.0)
Neutrophils: 70 %
Platelets: 244 10*3/uL (ref 150–450)
RBC: 4.31 x10E6/uL (ref 3.77–5.28)
RDW: 14.5 % (ref 11.7–15.4)
WBC: 5.5 10*3/uL (ref 3.4–10.8)

## 2021-03-19 LAB — LIPID PANEL
Chol/HDL Ratio: 3.4 ratio (ref 0.0–4.4)
Cholesterol, Total: 127 mg/dL (ref 100–199)
HDL: 37 mg/dL — ABNORMAL LOW (ref >39)
LDL Chol Calc (NIH): 59 mg/dL (ref 0–99)
Triglycerides: 184 mg/dL — ABNORMAL HIGH (ref 0–149)
VLDL Cholesterol Cal: 31 mg/dL (ref 5–40)

## 2021-03-19 MED ORDER — RALOXIFENE HCL 60 MG PO TABS: 60.0000 mg | ORAL_TABLET | Freq: Every day | ORAL | 3 refills | Status: DC

## 2021-03-19 NOTE — Progress Notes (Signed)
Subjective:  Patient ID: Renee Maldonado, female    DOB: [birth year]  Age: 75 y.o. MRN: [id number]  CC: Medical Management of Chronic Issues   HPI Jewels Pistilli presents for COPD follow up Not dyspneic. mMRC group 2. No FEV 1 on file. Using Breo plus prn albuterol No recent exacerbation noted.   Recent colonoscopy by [facility] in [place].   Upset that reclast can cause jaw necrosis. Refuses to take it again. Also read that Prolia can cause. Refuses it as well.  Due in August. Was taken off of Evista. Not sure why, but it was when she was put on Arimidex   in for follow-up of elevated cholesterol. Doing well without complaints on current medication. Denies side effects of statin including myalgia and arthralgia and nausea. Currently no chest pain, shortness of breath or other cardiovascular related symptoms noted.   presents for  follow-up of hypertension. Patient has no history of headache chest pain or shortness of breath or recent cough. Patient also denies symptoms of TIA such as focal numbness or weakness. Patient denies side effects from medication. States taking it regularly.   Depression screen [facility] 2/9 03/19/2021 12/26/2020 12/13/2020  Decreased Interest 0 0 0  Down, Depressed, Hopeless 0 0 0  PHQ - 2 Score 0 0 0    History Danija has a past medical history of Anxiety, Arthritis, Cancer ([place]), COPD (chronic obstructive pulmonary disease) ([place]), GERD (gastroesophageal reflux disease), Humerus fracture (2007), Hyperlipidemia, Hypertension, Insomnia, and OA (osteoarthritis) of knee (10/08/2020).   She has a past surgical history that includes Total knee arthroplasty; Left shoulder surgery; Joint replacement; Tubal ligation; Breast surgery (Right, 01/2019); Breast lumpectomy (Right, 02/2019); and Total knee arthroplasty (Right, 10/08/2020).   Her family history includes Alzheimer's disease in her mother; Cancer in her brother; Diabetes in her mother; Hypertension in her father; Stroke in her  father.She reports that she has been smoking cigarettes. She has a 10.00 pack-year smoking history. She has never used smokeless tobacco. She reports current alcohol use. She reports that she does not use drugs.    ROS Review of Systems  Constitutional: Negative.   HENT: Negative.   Eyes: Negative for visual disturbance.  Respiratory: Negative for shortness of breath.   Cardiovascular: Negative for chest pain.  Gastrointestinal: Negative for abdominal pain.  Musculoskeletal: Negative for arthralgias.    Objective:  BP 129/71   Pulse 76   Temp 97.7 F (36.5 C)   Ht '5\' 2"'  (1.575 m)   Wt 189 lb 3.2 oz (85.8 kg)   SpO2 97%   BMI 34.61 kg/m   BP Readings from Last 3 Encounters:  03/19/21 129/71  12/26/20 121/74  12/13/20 127/73    Wt Readings from Last 3 Encounters:  03/19/21 189 lb 3.2 oz (85.8 kg)  12/13/20 181 lb 6.4 oz (82.3 kg)  10/08/20 190 lb (86.2 kg)     Physical Exam Constitutional:      General: She is not in acute distress.    Appearance: She is well-developed.  Cardiovascular:     Rate and Rhythm: Normal rate and regular rhythm.  Pulmonary:     Breath sounds: Normal breath sounds.  Skin:    General: Skin is warm and dry.  Neurological:     Mental Status: She is alert and oriented to person, place, and time.       Assessment & Plan:   Tezra was seen today for medical management of chronic issues.  Diagnoses and all orders for this  visit:  Pulmonary emphysema, unspecified emphysema type ([place]) -     CBC with Differential/Platelet -     CMP14+EGFR -     Lipid panel  Mixed hyperlipidemia -     CMP14+EGFR -     Lipid panel  Primary hypertension -     CBC with Differential/Platelet -     CMP14+EGFR -     Lipid panel  Other orders -     raloxifene (EVISTA) 60 MG tablet; Take 1 tablet (60 mg total) by mouth daily. For bone health       I am having Gemma Mccreery start on raloxifene. I am also having her maintain her Restasis,  acetaminophen, anastrozole, clindamycin, Chlor-Trimeton, methocarbamol, valACYclovir, traZODone, rosuvastatin, Breo Ellipta, famotidine, amLODipine, albuterol, and hydrochlorothiazide.  Allergies as of 03/19/2021      Reactions   Benadryl [diphenhydramine Hcl]    Penicillins    Had a rash around 20 years ago but unsure if it come from medicine.  Tolerated Cephalosporin Date: 10/09/20.      Medication List       Accurate as of Mar 19, 2021  9:16 PM. If you have any questions, ask your nurse or doctor.        acetaminophen 500 MG tablet Commonly known as: TYLENOL Take 500 mg by mouth every 6 (six) hours as needed for moderate pain.   albuterol 108 (90 Base) MCG/ACT inhaler Commonly known as: VENTOLIN HFA 2 puffs in lungs every 6 hours as needed for wheezing/short of breath.   amLODipine 5 MG tablet Commonly known as: NORVASC Take 1 tablet (5 mg total) by mouth daily.   anastrozole 1 MG tablet Commonly known as: ARIMIDEX Take 1 mg by mouth daily.   Breo Ellipta 100-25 MCG/INH Aepb Generic drug: fluticasone furoate-vilanterol INHALE 1 PUFF ONCE DAILY   Chlor-Trimeton 4 MG tablet Generic drug: chlorpheniramine Take 4 mg by mouth in the morning and at bedtime.   clindamycin 300 MG capsule Commonly known as: CLEOCIN Take 300 mg by mouth See admin instructions. Take 600 mg by mouth one hour prior to dental appointment.   famotidine 40 MG tablet Commonly known as: PEPCID Take 1 tablet (40 mg total) by mouth daily.   hydrochlorothiazide 25 MG tablet Commonly known as: HYDRODIURIL TAKE 1 TABLET DAILY   methocarbamol 500 MG tablet Commonly known as: ROBAXIN Take 1 tablet (500 mg total) by mouth every 6 (six) hours as needed for muscle spasms.   raloxifene 60 MG tablet Commonly known as: Evista Take 1 tablet (60 mg total) by mouth daily. For bone health Started by: Claretta Fraise, MD   Restasis 0.05 % ophthalmic emulsion Generic drug: cycloSPORINE Place 1 drop into both  eyes 2 (two) times daily as needed (dry eyes).   rosuvastatin 20 MG tablet Commonly known as: CRESTOR Take 1 tablet (20 mg total) by mouth daily.   traZODone 50 MG tablet Commonly known as: DESYREL Take 1 tablet (50 mg total) by mouth at bedtime.   valACYclovir 500 MG tablet Commonly known as: VALTREX TAKE (1) TABLET DAILY AS NEEDED.        Follow-up: Return in about 6 months (around 09/19/2021).  Claretta Fraise, M.D.

## 2021-03-22 DIAGNOSIS — M25561 Pain in right knee: Secondary | ICD-10-CM | POA: Diagnosis not present

## 2021-03-22 DIAGNOSIS — Z96651 Presence of right artificial knee joint: Secondary | ICD-10-CM | POA: Diagnosis not present

## 2021-03-24 NOTE — Progress Notes (Signed)
Hello Saranda,  Your lab result is normal and/or stable.Some minor variations that are not significant are commonly marked abnormal, but do not represent any medical problem for you.  Best regards, Claretta Fraise, M.D.

## 2021-03-25 ENCOUNTER — Other Ambulatory Visit: Payer: Self-pay | Admitting: Family Medicine

## 2021-03-25 DIAGNOSIS — I10 Essential (primary) hypertension: Secondary | ICD-10-CM

## 2021-04-12 DIAGNOSIS — Z9011 Acquired absence of right breast and nipple: Secondary | ICD-10-CM | POA: Diagnosis not present

## 2021-04-12 DIAGNOSIS — R922 Inconclusive mammogram: Secondary | ICD-10-CM | POA: Diagnosis not present

## 2021-04-12 DIAGNOSIS — Z170 Estrogen receptor positive status [ER+]: Secondary | ICD-10-CM | POA: Diagnosis not present

## 2021-04-12 DIAGNOSIS — C50311 Malignant neoplasm of lower-inner quadrant of right female breast: Secondary | ICD-10-CM | POA: Diagnosis not present

## 2021-04-24 DIAGNOSIS — C50912 Malignant neoplasm of unspecified site of left female breast: Secondary | ICD-10-CM | POA: Diagnosis not present

## 2021-04-24 DIAGNOSIS — Z79811 Long term (current) use of aromatase inhibitors: Secondary | ICD-10-CM | POA: Diagnosis not present

## 2021-04-24 DIAGNOSIS — Z5181 Encounter for therapeutic drug level monitoring: Secondary | ICD-10-CM | POA: Diagnosis not present

## 2021-04-24 DIAGNOSIS — T50905A Adverse effect of unspecified drugs, medicaments and biological substances, initial encounter: Secondary | ICD-10-CM | POA: Diagnosis not present

## 2021-04-24 DIAGNOSIS — C50311 Malignant neoplasm of lower-inner quadrant of right female breast: Secondary | ICD-10-CM | POA: Diagnosis not present

## 2021-04-24 DIAGNOSIS — Z170 Estrogen receptor positive status [ER+]: Secondary | ICD-10-CM | POA: Diagnosis not present

## 2021-04-24 DIAGNOSIS — M818 Other osteoporosis without current pathological fracture: Secondary | ICD-10-CM | POA: Diagnosis not present

## 2021-05-13 ENCOUNTER — Telehealth: Payer: Self-pay | Admitting: Family Medicine

## 2021-05-13 NOTE — Telephone Encounter (Signed)
Sounds perfect to me. Continue as is. Let me know if the condition takes a turn for the worse.

## 2021-05-13 NOTE — Telephone Encounter (Signed)
Patient had cough that began late last week but seems to have improved today, some slight wheezing and chest congestion.  She is using her Breo inhaler and has taken OTC cough medication, drinking lots of fluids.  She does not have any shortness of breath or fever.  She would like to know if you recommend she do anything else.

## 2021-05-13 NOTE — Telephone Encounter (Signed)
Patient aware.

## 2021-05-13 NOTE — Telephone Encounter (Signed)
Pt rc for nurse 

## 2021-05-13 NOTE — Telephone Encounter (Signed)
Left message to call back  

## 2021-05-16 ENCOUNTER — Ambulatory Visit (INDEPENDENT_AMBULATORY_CARE_PROVIDER_SITE_OTHER): Payer: Medicare HMO | Admitting: Family Medicine

## 2021-05-16 ENCOUNTER — Encounter: Payer: Self-pay | Admitting: Family Medicine

## 2021-05-16 DIAGNOSIS — J40 Bronchitis, not specified as acute or chronic: Secondary | ICD-10-CM | POA: Diagnosis not present

## 2021-05-16 DIAGNOSIS — J329 Chronic sinusitis, unspecified: Secondary | ICD-10-CM

## 2021-05-16 MED ORDER — SULFAMETHOXAZOLE-TRIMETHOPRIM 800-160 MG PO TABS
1.0000 | ORAL_TABLET | Freq: Two times a day (BID) | ORAL | 0 refills | Status: DC
Start: 2021-05-16 — End: 2021-09-23

## 2021-05-16 NOTE — Progress Notes (Signed)
Subjective:    Patient ID: Renee Maldonado, female    DOB: 08-Jul-1946, 75 y.o.   MRN: [id number]   HPI: Renee Maldonado is a 75 y.o. female presenting for "bronchitis." Onset seven days ago. She is having wheezing that responds to albuterol. Taking Corcidin as well. Sneezing for sneezing. Coughing. Nonproductive. No fever, chills or sweats. Chest congested. Denies dyspnea. Using [place] daily. Helping with sx. Home Covid test was negative.Slight otalgia, sore throat. Congested runny nose. Watery rhinorrhea.    Depression screen [facility] 2/9 03/19/2021 12/26/2020 12/13/2020 08/01/2020 08/01/2020  Decreased Interest 0 0 0 0 0  Down, Depressed, Hopeless 0 0 0 0 0  PHQ - 2 Score 0 0 0 0 0     Relevant past medical, surgical, family and social history reviewed and updated as indicated.  Interim medical history since our last visit reviewed. Allergies and medications reviewed and updated.  ROS:  Review of Systems  Constitutional:  Negative for activity change, appetite change, chills and fever.  HENT:  Positive for congestion, postnasal drip, rhinorrhea and sinus pressure. Negative for ear discharge, ear pain, hearing loss, nosebleeds, sneezing and trouble swallowing.   Respiratory:  Negative for chest tightness and shortness of breath.   Cardiovascular:  Negative for chest pain and palpitations.  Skin:  Negative for rash.    Social History   Tobacco Use  Smoking Status Every Day   Packs/day: 0.25   Years: 40.00   Pack years: 10.00   Types: Cigarettes   Last attempt to quit: 09/17/2014   Years since quitting: 6.6  Smokeless Tobacco Never  Tobacco Comments   smokes 3 a day       Objective:     Wt Readings from Last 3 Encounters:  03/19/21 189 lb 3.2 oz (85.8 kg)  12/13/20 181 lb 6.4 oz (82.3 kg)  10/08/20 190 lb (86.2 kg)     Exam deferred. Pt. Harboring due to COVID 19. Phone visit performed.   Assessment & Plan:   1. Sinobronchitis     Meds ordered this encounter  Medications    sulfamethoxazole-trimethoprim (BACTRIM DS) 800-160 MG tablet    Sig: Take 1 tablet by mouth 2 (two) times daily. Until gone, for infection    Dispense:  20 tablet    Refill:  0    No orders of the defined types were placed in this encounter.     Diagnoses and all orders for this visit:  Sinobronchitis  Other orders -     sulfamethoxazole-trimethoprim (BACTRIM DS) 800-160 MG tablet; Take 1 tablet by mouth 2 (two) times daily. Until gone, for infection   Virtual Visit via telephone Note  I discussed the limitations, risks, security and privacy concerns of performing an evaluation and management service by telephone and the availability of in person appointments. The patient was identified with two identifiers. Pt.expressed understanding and agreed to proceed. Pt. Is at home. Dr. Livia Snellen is in his office.  Follow Up Instructions:   I discussed the assessment and treatment plan with the patient. The patient was provided an opportunity to ask questions and all were answered. The patient agreed with the plan and demonstrated an understanding of the instructions.   The patient was advised to call back or seek an in-person evaluation if the symptoms worsen or if the condition fails to improve as anticipated.   Total minutes including chart review and phone contact time: 12   Follow up plan: No follow-ups on file.  Claretta Fraise, MD Tristan Schroeder  [place]

## 2021-06-17 DIAGNOSIS — Z0289 Encounter for other administrative examinations: Secondary | ICD-10-CM

## 2021-07-17 DIAGNOSIS — Z471 Aftercare following joint replacement surgery: Secondary | ICD-10-CM | POA: Diagnosis not present

## 2021-07-17 DIAGNOSIS — Z96652 Presence of left artificial knee joint: Secondary | ICD-10-CM | POA: Diagnosis not present

## 2021-07-29 ENCOUNTER — Other Ambulatory Visit: Payer: Self-pay | Admitting: Family Medicine

## 2021-07-29 DIAGNOSIS — G4709 Other insomnia: Secondary | ICD-10-CM

## 2021-07-29 DIAGNOSIS — I10 Essential (primary) hypertension: Secondary | ICD-10-CM

## 2021-08-02 ENCOUNTER — Ambulatory Visit (INDEPENDENT_AMBULATORY_CARE_PROVIDER_SITE_OTHER): Payer: Medicare HMO

## 2021-08-02 VITALS — Ht 62.0 in | Wt 189.0 lb

## 2021-08-02 DIAGNOSIS — Z0000 Encounter for general adult medical examination without abnormal findings: Secondary | ICD-10-CM

## 2021-08-02 NOTE — Progress Notes (Signed)
Subjective:   Renee Maldonado is a 75 y.o. female who presents for Medicare Annual (Subsequent) preventive examination.  Virtual Visit via Telephone Note  I connected with  Renee Maldonado on 08/02/21 at  9:45 AM EDT by telephone and verified that I am speaking with the correct person using two identifiers.  Location: Patient: Home Provider: [facility] Persons participating in the virtual visit: patient/Nurse Health Advisor   I discussed the limitations, risks, security and privacy concerns of performing an evaluation and management service by telephone and the availability of in person appointments. The patient expressed understanding and agreed to proceed.  Interactive audio and video telecommunications were attempted between this nurse and patient, however failed, due to patient having technical difficulties OR patient did not have access to video capability.  We continued and completed visit with audio only.  Some vital signs may be absent or patient reported.   Kamarah Bilotta E Rhea Thrun, LPN   Review of Systems     Cardiac Risk Factors include: advanced age (>75men, >37 women);dyslipidemia;sedentary lifestyle;obesity (BMI >30kg/m2);Other (see comment);hypertension;family history of premature cardiovascular disease, Risk factor comments: COPD     Objective:    Today's Vitals   08/02/21 0951 08/02/21 0952  Weight: 189 lb (85.7 kg)   Height: 5\' 2"  (1.575 m)   PainSc:  3    Body mass index is 34.57 kg/m.  Advanced Directives 08/02/2021 10/15/2020 10/08/2020 10/02/2020 08/01/2020 07/28/2019 03/20/2015  Does Patient Have a Medical Advance Directive? Yes Yes Yes Yes Yes No No  Type of Paramedic of [place];Living will - [place];Living will [place];Living will [place];Living will - -  Does patient want to make changes to medical advance directive? - - No - Patient declined No - Patient declined No - Patient declined -  -  Copy of [place] in Chart? No - copy requested - No - copy requested - No - copy requested - -  Would patient like information on creating a medical advance directive? - - - - - No - Patient declined No - patient declined information    Current Medications (verified) Outpatient Encounter Medications as of 08/02/2021  Medication Sig   acetaminophen (TYLENOL) 500 MG tablet Take 500 mg by mouth every 6 (six) hours as needed for moderate pain.    albuterol (VENTOLIN HFA) 108 (90 Base) MCG/ACT inhaler 2 puffs in lungs every 6 hours as needed for wheezing/short of breath.   amLODipine (NORVASC) 5 MG tablet TAKE 1 TABLET DAILY   anastrozole (ARIMIDEX) 1 MG tablet Take 1 mg by mouth daily.    chlorpheniramine (CHLOR-TRIMETON) 4 MG tablet Take 4 mg by mouth in the morning and at bedtime.    clindamycin (CLEOCIN) 300 MG capsule Take 300 mg by mouth See admin instructions. Take 600 mg by mouth one hour prior to dental appointment.   famotidine (PEPCID) 40 MG tablet Take 1 tablet (40 mg total) by mouth daily.   fluticasone furoate-vilanterol (BREO ELLIPTA) 100-25 MCG/INH AEPB INHALE 1 PUFF ONCE DAILY   hydrochlorothiazide (HYDRODIURIL) 25 MG tablet TAKE 1 TABLET DAILY   hydrOXYzine (ATARAX/VISTARIL) 25 MG tablet hydroxyzine HCl 25 mg tablet   methocarbamol (ROBAXIN) 500 MG tablet Take 1 tablet (500 mg total) by mouth every 6 (six) hours as needed for muscle spasms.   raloxifene (EVISTA) 60 MG tablet Take 1 tablet (60 mg total) by mouth daily. For bone health   RESTASIS 0.05 % ophthalmic emulsion Place 1  drop into both eyes 2 (two) times daily as needed (dry eyes).    rosuvastatin (CRESTOR) 20 MG tablet Take 1 tablet (20 mg total) by mouth daily.   sulfamethoxazole-trimethoprim (BACTRIM DS) 800-160 MG tablet Take 1 tablet by mouth 2 (two) times daily. Until gone, for infection   traZODone (DESYREL) 50 MG tablet TAKE ONE TABLET AT BEDTIME   valACYclovir (VALTREX) 500 MG tablet TAKE  (1) TABLET DAILY AS NEEDED.   [DISCONTINUED] raloxifene (EVISTA) 60 MG tablet Take by mouth.   No facility-administered encounter medications on file as of 08/02/2021.    Allergies (verified) Benadryl [diphenhydramine hcl] and Penicillins   History: Past Medical History:  Diagnosis Date   Anxiety    Arthritis    Cancer ([place])    breast Right no chemo or radiation  on Arimidex   COPD (chronic obstructive pulmonary disease) ([place])    on Breo   GERD (gastroesophageal reflux disease)    Humerus fracture 2007   Hyperlipidemia    Hypertension    Insomnia    OA (osteoarthritis) of knee 10/08/2020   Past Surgical History:  Procedure Laterality Date   BREAST LUMPECTOMY Right 02/2019   BREAST SURGERY Right 01/2019   breast biopsy   JOINT REPLACEMENT     left knee   Left shoulder surgery     2007   TOTAL KNEE ARTHROPLASTY     2012 left   TOTAL KNEE ARTHROPLASTY Right 10/08/2020   Procedure: TOTAL KNEE ARTHROPLASTY;  Surgeon: Gaynelle Arabian, MD;  Location: [facility];  Service: Orthopedics;  Laterality: Right;   TUBAL LIGATION     Family History  Problem Relation Age of Onset   Alzheimer's disease Mother    Diabetes Mother    Stroke Father    Hypertension Father    Cancer Brother    Social History   Socioeconomic History   Marital status: Married    Spouse name: Jori Moll   Number of children: 3   Years of education: 16   Highest education level: Bachelor's degree (e.g., BA, AB, BS)  Occupational History   Occupation: retired  Tobacco Use   Smoking status: Every Day    Packs/day: 0.25    Years: 40.00    Pack years: 10.00    Types: Cigarettes    Last attempt to quit: 09/17/2014    Years since quitting: 6.8   Smokeless tobacco: Never   Tobacco comments:    smokes 3 a day  Vaping Use   Vaping Use: Former   Start date: 07/18/2018   Quit date: 07/19/2019  Substance and Sexual Activity   Alcohol use: Yes    Alcohol/week: 0.0 standard drinks    Comment: rarely-     Drug  use: No   Sexual activity: Not Currently  Other Topics Concern   Not on file  Social History Narrative   Lives home with her husband. Enjoys travelling, Geographical information systems officer, exercising at Recreation center, eats out a lot   Social Determinants of Radio broadcast assistant Strain: Low Risk    Difficulty of Paying Living Expenses: Not hard at all  Food Insecurity: No Food Insecurity   Worried About Charity fundraiser in the Last Year: Never true   Arboriculturist in the Last Year: Never true  Transportation Needs: No Transportation Needs   Lack of Transportation (Medical): No   Lack of Transportation (Non-Medical): No  Physical Activity: Insufficiently Active   Days of Exercise per Week: 4 days  Minutes of Exercise per Session: 30 min  Stress: No Stress Concern Present   Feeling of Stress : Not at all  Social Connections: Socially Integrated   Frequency of Communication with Friends and Family: More than three times a week   Frequency of Social Gatherings with Friends and Family: More than three times a week   Attends Religious Services: More than 4 times per year   Active Member of [employer] or Organizations: Yes   Attends Music therapist: More than 4 times per year   Marital Status: Married    Tobacco Counseling Ready to quit: Not Answered Counseling given: Not Answered Tobacco comments: smokes 3 a day   Clinical Intake:  Pre-visit preparation completed: Yes  Pain : 0-10 Pain Score: 3  Pain Type: Chronic pain Pain Location: Generalized Pain Orientation: Right, Left Pain Radiating Towards: joints Pain Descriptors / Indicators: Discomfort, Aching, Sore Pain Onset: More than a month ago Pain Frequency: Intermittent     BMI - recorded: 34.57 Nutritional Status: BMI > 30  Obese Nutritional Risks: None Diabetes: No CBG done?: No Did pt. bring in CBG monitor from home?: No  How often do you need to have someone help you when you read instructions,  pamphlets, or other written materials from your doctor or pharmacy?: 1 - Never  Diabetic?No  Interpreter Needed?: No  Information entered by :: Enya Bureau, LPN   Activities of Daily Living In your present state of health, do you have any difficulty performing the following activities: 08/02/2021 10/08/2020  Hearing? Y -  Comment mild -  Vision? N -  Difficulty concentrating or making decisions? N -  Walking or climbing stairs? N -  Dressing or bathing? N -  Doing errands, shopping? N N  Preparing Food and eating ? N -  Using the Toilet? N -  In the past six months, have you accidently leaked urine? Y -  Comment wears pantyliners for protection -  Do you have problems with loss of bowel control? Y -  Comment if she eats too much fruit or drinks too much fruit juice -  Managing your Medications? N -  Managing your Finances? N -  Housekeeping or managing your Housekeeping? N -  Some recent data might be hidden    Patient Care Team: Claretta Fraise, MD as PCP - General (Family Medicine) Karlene Einstein, MD as Referring Physician (Orthopedic Surgery)  Indicate any recent Medical Services you may have received from other than [facility] providers in the past year (date may be approximate).     Assessment:   This is a routine wellness examination for Renee Maldonado.  Hearing/Vision screen Hearing Screening - Comments:: mild hearing difficulties - declines hearing aids Vision Screening - Comments:: Wears eyeglasses - up to date with annual eye exams with Dr Marin Comment - also sees [facility] [place], specialist for retina issues  Dietary issues and exercise activities discussed: Current Exercise Habits: Structured exercise class (chair exercises at [employer] in [place], [place], and [place] - 4 days per week), Type of exercise: stretching;Other - see comments (chair exercises), Time (Minutes): 30, Frequency (Times/Week): 4, Weekly Exercise (Minutes/Week): 120, Intensity: Moderate, Exercise limited by:  respiratory conditions(s)   Goals Addressed             This Visit's Progress    Exercise 3x per week (30 min per time)   On track    08/01/2020 AWV Goal: Exercise for General Health  Patient will verbalize understanding of the benefits  of increased physical activity: Exercising regularly is important. It will improve your overall fitness, flexibility, and endurance. Regular exercise also will improve your overall health. It can help you control your weight, reduce stress, and improve your bone density. Over the next year, patient will increase physical activity as tolerated with a goal of at least 150 minutes of moderate physical activity per week.  You can tell that you are exercising at a moderate intensity if your heart starts beating faster and you start breathing faster but can still hold a conversation. Moderate-intensity exercise ideas include: Walking 1 mile (1.6 km) in about 15 minutes Biking Hiking Golfing Dancing Water aerobics Patient will verbalize understanding of everyday activities that increase physical activity by providing examples like the following: Yard work, such as: Sales promotion account executive Gardening Washing windows or floors Patient will be able to explain general safety guidelines for exercising:  Before you start a new exercise program, talk with your health care provider. Do not exercise so much that you hurt yourself, feel dizzy, or get very short of breath. Wear comfortable clothes and wear shoes with good support. Drink plenty of water while you exercise to prevent dehydration or heat stroke. Work out until your breathing and your heartbeat get faster.        Depression Screen PHQ 2/9 Scores 08/02/2021 03/19/2021 12/26/2020 12/13/2020 08/01/2020 08/01/2020 06/12/2020  PHQ - 2 Score 0 0 0 0 0 0 0    Fall Risk Fall Risk  08/02/2021 03/19/2021 12/26/2020 12/13/2020 08/01/2020  Falls  in the past year? 0 0 0 0 0  Number falls in past yr: 0 - - 0 0  Injury with Fall? 0 - - 0 0  Risk for fall due to : No Fall Risks;Impaired balance/gait - - No Fall Risks No Fall Risks  Follow up Falls prevention discussed - - Falls evaluation completed Falls evaluation completed  Comment - - - - -    FALL RISK PREVENTION PERTAINING TO THE HOME:  Any stairs in or around the home? Yes  If so, are there any without handrails? No  Home free of loose throw rugs in walkways, pet beds, electrical cords, etc? Yes  Adequate lighting in your home to reduce risk of falls? Yes   ASSISTIVE DEVICES UTILIZED TO PREVENT FALLS:  Life alert? No  Use of a cane, walker or w/c? Yes  Grab bars in the bathroom? Yes  Shower chair or bench in shower? Yes  Elevated toilet seat or a handicapped toilet? Yes   TIMED UP AND GO:  Was the test performed? No . Telephonic visit  Cognitive Function: Normal cognitive status assessed by direct observation by this Nurse Health Advisor. No abnormalities found.    MMSE - Mini Mental State Exam 03/20/2015  Orientation to time 5  Orientation to Place 5  Registration 3  Attention/ Calculation 5  Recall 3  Language- name 2 objects 2  Language- repeat 1  Language- follow 3 step command 3  Language- read & follow direction 1  Write a sentence 1  Copy design 1  Total score 30     6CIT Screen 08/01/2020 07/28/2019  What Year? 0 points 0 points  What month? 0 points 0 points  What time? 0 points 0 points  Count back from 20 0 points 0 points  Months in reverse 0 points 0 points  Repeat phrase 0 points 0 points  Total Score  0 0    Immunizations Immunization History  Administered Date(s) Administered   Fluad Quad(high Dose 65+) 07/20/2019, 08/20/2020   H1N1 10/24/2008   Influenza Inj Mdck Quad Pf 07/20/2019, 08/20/2020   Influenza Nasal 07/19/2011   Influenza Split 07/28/2013, 08/23/2014, 08/07/2015   Influenza, Seasonal, Injecte, Preservative Fre  09/11/2009, 09/05/2010   Influenza,inj,Quad PF,6+ Mos 08/24/2017, 08/30/2018   Influenza,inj,quad, With Preservative 08/15/2016, 08/24/2017   Influenza-Unspecified 10/24/2008, 08/23/2014, 08/07/2015, 08/15/2016, 08/30/2018, 07/20/2019, 08/26/2020   Moderna Sars-Covid-2 Vaccination 12/27/2019, 01/24/2020, 11/13/2020   Pneumococcal Conjugate-13 08/23/2014   Pneumococcal Polysaccharide-23 08/30/2015   Tdap 06/29/2013   Zoster Recombinat (Shingrix) 06/24/2021    TDAP status: Up to date  Flu Vaccine status: Up to date  Pneumococcal vaccine status: Up to date  Covid-19 vaccine status: Completed vaccines  Qualifies for Shingles Vaccine? Yes   Zostavax completed Yes   Shingrix Completed?: No.    Education has been provided regarding the importance of this vaccine. Patient has been advised to call insurance company to determine out of pocket expense if they have not yet received this vaccine. Advised may also receive vaccine at local pharmacy or Health Dept. Verbalized acceptance and understanding.  Screening Tests Health Maintenance  Topic Date Due   COVID-19 Vaccine (4 - Booster for Moderna series) 02/05/2021   INFLUENZA VACCINE  06/17/2021   Zoster Vaccines- Shingrix (2 of 2) 08/19/2021   DEXA SCAN  02/04/2022   MAMMOGRAM  04/12/2022   TETANUS/TDAP  06/30/2023   Hepatitis C Screening  Completed   PNA vac Low Risk Adult  Completed   HPV VACCINES  Aged Out    Health Maintenance  Health Maintenance Due  Topic Date Due   COVID-19 Vaccine (4 - Booster for Moderna series) 02/05/2021   INFLUENZA VACCINE  06/17/2021    Colorectal cancer screening: No longer required.   Mammogram status: Completed 04/12/2021. Repeat every year  Bone Density status: Completed 02/04/2021. Results reflect: Bone density results: OSTEOPOROSIS. Repeat every 2 years.  Lung Cancer Screening: (Low Dose CT Chest recommended if Age 82-80 years, 30 pack-year currently smoking OR have quit w/in 15years.) does  not qualify.   Additional Screening:  Hepatitis C Screening: does qualify; Completed 11/30/2015  Vision Screening: Recommended annual ophthalmology exams for early detection of glaucoma and other disorders of the eye. Is the patient up to date with their annual eye exam?  Yes  Who is the provider or what is the name of the office in which the patient attends annual eye exams? [facility]  If pt is not established with a provider, would they like to be referred to a provider to establish care? No .   Dental Screening: Recommended annual dental exams for proper oral hygiene  Community Resource Referral / Chronic Care Management: CRR required this visit?  No   CCM required this visit?  No      Plan:     I have personally reviewed and noted the following in the patient's chart:   Medical and social history Use of alcohol, tobacco or illicit drugs  Current medications and supplements including opioid prescriptions.  Functional ability and status Nutritional status Physical activity Advanced directives List of other physicians Hospitalizations, surgeries, and ER visits in previous 12 months Vitals Screenings to include cognitive, depression, and falls Referrals and appointments  In addition, I have reviewed and discussed with patient certain preventive protocols, quality metrics, and best practice recommendations. A written personalized care plan for preventive services as well as general preventive health recommendations  were provided to patient.     Sandrea Hammond, LPN   2/29/7989   Nurse Notes: None

## 2021-08-02 NOTE — Patient Instructions (Signed)
Ms. Begin , Thank you for taking time to come for your Medicare Wellness Visit. I appreciate your ongoing commitment to your health goals. Please review the following plan we discussed and let me know if I can assist you in the future.   Screening recommendations/referrals: Colonoscopy: Done 02/2021 - no repeat required Mammogram: Done 04/12/2021 - Repeat annually  Bone Density: Done 02/05/2020 - Repeat every 2 years Recommended yearly ophthalmology/optometry visit for glaucoma screening and checkup Recommended yearly dental visit for hygiene and checkup  Vaccinations: Influenza vaccine: Done 08/26/2020 - Repeat annually Pneumococcal vaccine: Done 08/30/2015 & 08/23/2014 Tdap vaccine: Done 06/29/2013 - Repeat in 10 years Shingles vaccine: Done 06/24/2021 - get second dose in 2-6 months   Covid-19: Done 12/27/19, 01/24/2020, & 11/13/20 - declines further doses  Advanced directives: Please bring a copy of your health care power of attorney and living will to the office to be added to your chart at your convenience.   Conditions/risks identified: Continue exercising - Aim for 30 minutes of physical activity each day. Limit sweets! I have included helpful ways to count calories for weight loss.  Next appointment: Follow up in one year for your annual wellness visit    Preventive Care 65 Years and Older, Female Preventive care refers to lifestyle choices and visits with your health care provider that can promote health and wellness. What does preventive care include? A yearly physical exam. This is also called an annual well check. Dental exams once or twice a year. Routine eye exams. Ask your health care provider how often you should have your eyes checked. Personal lifestyle choices, including: Daily care of your teeth and gums. Regular physical activity. Eating a healthy diet. Avoiding tobacco and drug use. Limiting alcohol use. Practicing safe sex. Taking low-dose aspirin every day. Taking  vitamin and mineral supplements as recommended by your health care provider. What happens during an annual well check? The services and screenings done by your health care provider during your annual well check will depend on your age, overall health, lifestyle risk factors, and family history of disease. Counseling  Your health care provider may ask you questions about your: Alcohol use. Tobacco use. Drug use. Emotional well-being. Home and relationship well-being. Sexual activity. Eating habits. History of falls. Memory and ability to understand (cognition). Work and work Statistician. Reproductive health. Screening  You may have the following tests or measurements: Height, weight, and BMI. Blood pressure. Lipid and cholesterol levels. These may be checked every 5 years, or more frequently if you are over 44 years old. Skin check. Lung cancer screening. You may have this screening every year starting at age 71 if you have a 30-pack-year history of smoking and currently smoke or have quit within the past 15 years. Fecal occult blood test (FOBT) of the stool. You may have this test every year starting at age 60. Flexible sigmoidoscopy or colonoscopy. You may have a sigmoidoscopy every 5 years or a colonoscopy every 10 years starting at age 44. Hepatitis C blood test. Hepatitis B blood test. Sexually transmitted disease (STD) testing. Diabetes screening. This is done by checking your blood sugar (glucose) after you have not eaten for a while (fasting). You may have this done every 1-3 years. Bone density scan. This is done to screen for osteoporosis. You may have this done starting at age 35. Mammogram. This may be done every 1-2 years. Talk to your health care provider about how often you should have regular mammograms. Talk with your health  care provider about your test results, treatment options, and if necessary, the need for more tests. Vaccines  Your health care provider may  recommend certain vaccines, such as: Influenza vaccine. This is recommended every year. Tetanus, diphtheria, and acellular pertussis (Tdap, Td) vaccine. You may need a Td booster every 10 years. Zoster vaccine. You may need this after age 37. Pneumococcal 13-valent conjugate (PCV13) vaccine. One dose is recommended after age 81. Pneumococcal polysaccharide (PPSV23) vaccine. One dose is recommended after age 104. Talk to your health care provider about which screenings and vaccines you need and how often you need them. This information is not intended to replace advice given to you by your health care provider. Make sure you discuss any questions you have with your health care provider. Document Released: 11/30/2015 Document Revised: 07/23/2016 Document Reviewed: 09/04/2015 Elsevier Interactive Patient Education  2017 [place] Prevention in the Home Falls can cause injuries. They can happen to people of all ages. There are many things you can do to make your home safe and to help prevent falls. What can I do on the outside of my home? Regularly fix the edges of walkways and driveways and fix any cracks. Remove anything that might make you trip as you walk through a door, such as a raised step or threshold. Trim any bushes or trees on the path to your home. Use bright outdoor lighting. Clear any walking paths of anything that might make someone trip, such as rocks or tools. Regularly check to see if handrails are loose or broken. Make sure that both sides of any steps have handrails. Any raised decks and porches should have guardrails on the edges. Have any leaves, snow, or ice cleared regularly. Use sand or salt on walking paths during winter. Clean up any spills in your garage right away. This includes oil or grease spills. What can I do in the bathroom? Use night lights. Install grab bars by the toilet and in the tub and shower. Do not use towel bars as grab bars. Use non-skid  mats or decals in the tub or shower. If you need to sit down in the shower, use a plastic, non-slip stool. Keep the floor dry. Clean up any water that spills on the floor as soon as it happens. Remove soap buildup in the tub or shower regularly. Attach bath mats securely with double-sided non-slip rug tape. Do not have throw rugs and other things on the floor that can make you trip. What can I do in the bedroom? Use night lights. Make sure that you have a light by your bed that is easy to reach. Do not use any sheets or blankets that are too big for your bed. They should not hang down onto the floor. Have a firm chair that has side arms. You can use this for support while you get dressed. Do not have throw rugs and other things on the floor that can make you trip. What can I do in the kitchen? Clean up any spills right away. Avoid walking on wet floors. Keep items that you use a lot in easy-to-reach places. If you need to reach something above you, use a strong step stool that has a grab bar. Keep electrical cords out of the way. Do not use floor polish or wax that makes floors slippery. If you must use wax, use non-skid floor wax. Do not have throw rugs and other things on the floor that can make you trip. What can  I do with my stairs? Do not leave any items on the stairs. Make sure that there are handrails on both sides of the stairs and use them. Fix handrails that are broken or loose. Make sure that handrails are as long as the stairways. Check any carpeting to make sure that it is firmly attached to the stairs. Fix any carpet that is loose or worn. Avoid having throw rugs at the top or bottom of the stairs. If you do have throw rugs, attach them to the floor with carpet tape. Make sure that you have a light switch at the top of the stairs and the bottom of the stairs. If you do not have them, ask someone to add them for you. What else can I do to help prevent falls? Wear shoes  that: Do not have high heels. Have rubber bottoms. Are comfortable and fit you well. Are closed at the toe. Do not wear sandals. If you use a stepladder: Make sure that it is fully opened. Do not climb a closed stepladder. Make sure that both sides of the stepladder are locked into place. Ask someone to hold it for you, if possible. Clearly mark and make sure that you can see: Any grab bars or handrails. First and last steps. Where the edge of each step is. Use tools that help you move around (mobility aids) if they are needed. These include: Canes. Walkers. Scooters. Crutches. Turn on the lights when you go into a dark area. Replace any light bulbs as soon as they burn out. Set up your furniture so you have a clear path. Avoid moving your furniture around. If any of your floors are uneven, fix them. If there are any pets around you, be aware of where they are. Review your medicines with your doctor. Some medicines can make you feel dizzy. This can increase your chance of falling. Ask your doctor what other things that you can do to help prevent falls. This information is not intended to replace advice given to you by your health care provider. Make sure you discuss any questions you have with your health care provider. Document Released: 08/30/2009 Document Revised: 04/10/2016 Document Reviewed: 12/08/2014 Elsevier Interactive Patient Education  2017 [place] [employer] Calories are units of energy. Your body needs a certain number of calories from food to keep going throughout the day. When you eat or drink more calories than your body needs, your body stores the extra calories mostly as fat. When you eat or drink fewer calories than your body needs, your body burns fat to get the energy it needs. Calorie counting means keeping track of how many calories you eat and drink each day. Calorie counting can be helpful if you need to lose weight. If you eat  fewer calories than your body needs, you should lose weight. Ask your health care provider what a healthy weight is for you. For calorie counting to work, you will need to eat the right number of calories each day to lose a healthy amount of weight per week. A dietitian can help you figure out how many calories you need in a day and will suggest ways to reach your calorie goal. A healthy amount of weight to lose each week is usually 1-2 lb (0.5-0.9 kg). This usually means that your daily calorie intake should be reduced by 500-750 calories. Eating 1,200-1,500 calories a day can help most women lose weight. Eating 1,500-1,800 calories a day can help  most men lose weight. What do I need to know about calorie counting? Work with your health care provider or dietitian to determine how many calories you should get each day. To meet your daily calorie goal, you will need to: Find out how many calories are in each food that you would like to eat. Try to do this before you eat. Decide how much of the food you plan to eat. Keep a food log. Do this by writing down what you ate and how many calories it had. To successfully lose weight, it is important to balance calorie counting with a healthy lifestyle that includes regular activity. Where do I find calorie information? The number of calories in a food can be found on a Nutrition Facts label. If a food does not have a Nutrition Facts label, try to look up the calories online or ask your dietitian for help. Remember that calories are listed per serving. If you choose to have more than one serving of a food, you will have to multiply the calories per serving by the number of servings you plan to eat. For example, the label on a package of bread might say that a serving size is 1 slice and that there are 90 calories in a serving. If you eat 1 slice, you will have eaten 90 calories. If you eat 2 slices, you will have eaten 180 calories. How do I keep a food  log? After each time that you eat, record the following in your food log as soon as possible: What you ate. Be sure to include toppings, sauces, and other extras on the food. How much you ate. This can be measured in cups, ounces, or number of items. How many calories were in each food and drink. The total number of calories in the food you ate. Keep your food log near you, such as in a pocket-sized notebook or on an app or website on your mobile phone. Some programs will calculate calories for you and show you how many calories you have left to meet your daily goal. What are some portion-control tips? Know how many calories are in a serving. This will help you know how many servings you can have of a certain food. Use a measuring cup to measure serving sizes. You could also try weighing out portions on a kitchen scale. With time, you will be able to estimate serving sizes for some foods. Take time to put servings of different foods on your favorite plates or in your favorite bowls and cups so you know what a serving looks like. Try not to eat straight from a food's packaging, such as from a bag or box. Eating straight from the package makes it hard to see how much you are eating and can lead to overeating. Put the amount you would like to eat in a cup or on a plate to make sure you are eating the right portion. Use smaller plates, glasses, and bowls for smaller portions and to prevent overeating. Try not to multitask. For example, avoid watching TV or using your computer while eating. If it is time to eat, sit down at a table and enjoy your food. This will help you recognize when you are full. It will also help you be more mindful of what and how much you are eating. What are tips for following this plan? Reading food labels Check the calorie count compared with the serving size. The serving size may be smaller than what you  are used to eating. Check the source of the calories. Try to choose foods  that are high in protein, fiber, and vitamins, and low in saturated fat, trans fat, and sodium. Shopping Read nutrition labels while you shop. This will help you make healthy decisions about which foods to buy. Pay attention to nutrition labels for low-fat or fat-free foods. These foods sometimes have the same number of calories or more calories than the full-fat versions. They also often have added sugar, starch, or salt to make up for flavor that was removed with the fat. Make a grocery list of lower-calorie foods and stick to it. Cooking Try to cook your favorite foods in a healthier way. For example, try baking instead of frying. Use low-fat dairy products. Meal planning Use more fruits and vegetables. One-half of your plate should be fruits and vegetables. Include lean proteins, such as chicken, [place], and fish. Lifestyle Each week, aim to do one of the following: 150 minutes of moderate exercise, such as walking. 75 minutes of vigorous exercise, such as running. General information Know how many calories are in the foods you eat most often. This will help you calculate calorie counts faster. Find a way of tracking calories that works for you. Get creative. Try different apps or programs if writing down calories does not work for you. What foods should I eat?  Eat nutritious foods. It is better to have a nutritious, high-calorie food, such as an avocado, than a food with few nutrients, such as a bag of potato chips. Use your calories on foods and drinks that will fill you up and will not leave you hungry soon after eating. Examples of foods that fill you up are nuts and nut butters, vegetables, lean proteins, and high-fiber foods such as whole grains. High-fiber foods are foods with more than 5 g of fiber per serving. Pay attention to calories in drinks. Low-calorie drinks include water and unsweetened drinks. The items listed above may not be a complete list of foods and beverages you  can eat. Contact a dietitian for more information. What foods should I limit? Limit foods or drinks that are not good sources of vitamins, minerals, or protein or that are high in unhealthy fats. These include: Candy. Other sweets. Sodas, specialty coffee drinks, alcohol, and juice. The items listed above may not be a complete list of foods and beverages you should avoid. Contact a dietitian for more information. How do I count calories when eating out? Pay attention to portions. Often, portions are much larger when eating out. Try these tips to keep portions smaller: Consider sharing a meal instead of getting your own. If you get your own meal, eat only half of it. Before you start eating, ask for a container and put half of your meal into it. When available, consider ordering smaller portions from the menu instead of full portions. Pay attention to your food and drink choices. Knowing the way food is cooked and what is included with the meal can help you eat fewer calories. If calories are listed on the menu, choose the lower-calorie options. Choose dishes that include vegetables, fruits, whole grains, low-fat dairy products, and lean proteins. Choose items that are boiled, broiled, grilled, or steamed. Avoid items that are buttered, battered, fried, or served with cream sauce. Items labeled as crispy are usually fried, unless stated otherwise. Choose water, low-fat milk, unsweetened iced tea, or other drinks without added sugar. If you want an alcoholic beverage, choose a lower-calorie option,  such as a glass of wine or light beer. Ask for dressings, sauces, and syrups on the side. These are usually high in calories, so you should limit the amount you eat. If you want a salad, choose a garden salad and ask for grilled meats. Avoid extra toppings such as bacon, cheese, or fried items. Ask for the dressing on the side, or ask for olive oil and vinegar or lemon to use as dressing. Estimate how  many servings of a food you are given. Knowing serving sizes will help you be aware of how much food you are eating at restaurants. Where to find more information Centers for Disease Control and Prevention: http://www.wolf.info/ U.[place]. Department of Agriculture: http://www.wilson-mendoza.org/ Summary Calorie counting means keeping track of how many calories you eat and drink each day. If you eat fewer calories than your body needs, you should lose weight. A healthy amount of weight to lose per week is usually 1-2 lb (0.5-0.9 kg). This usually means reducing your daily calorie intake by 500-750 calories. The number of calories in a food can be found on a Nutrition Facts label. If a food does not have a Nutrition Facts label, try to look up the calories online or ask your dietitian for help. Use smaller plates, glasses, and bowls for smaller portions and to prevent overeating. Use your calories on foods and drinks that will fill you up and not leave you hungry shortly after a meal. This information is not intended to replace advice given to you by your health care provider. Make sure you discuss any questions you have with your health care provider. Document Revised: 12/15/2019 Document Reviewed: 12/15/2019 Elsevier Patient Education  2022 [employer].

## 2021-08-15 ENCOUNTER — Other Ambulatory Visit: Payer: Self-pay | Admitting: Family Medicine

## 2021-08-15 DIAGNOSIS — E785 Hyperlipidemia, unspecified: Secondary | ICD-10-CM

## 2021-09-23 ENCOUNTER — Encounter: Payer: Self-pay | Admitting: Family Medicine

## 2021-09-23 ENCOUNTER — Other Ambulatory Visit: Payer: Self-pay

## 2021-09-23 ENCOUNTER — Ambulatory Visit (INDEPENDENT_AMBULATORY_CARE_PROVIDER_SITE_OTHER): Payer: Medicare HMO | Admitting: Family Medicine

## 2021-09-23 ENCOUNTER — Other Ambulatory Visit: Payer: Self-pay | Admitting: Family Medicine

## 2021-09-23 VITALS — BP 140/78 | HR 65 | Temp 97.5°F | Ht 62.0 in | Wt 195.8 lb

## 2021-09-23 DIAGNOSIS — R0683 Snoring: Secondary | ICD-10-CM

## 2021-09-23 DIAGNOSIS — I10 Essential (primary) hypertension: Secondary | ICD-10-CM | POA: Diagnosis not present

## 2021-09-23 DIAGNOSIS — G4709 Other insomnia: Secondary | ICD-10-CM | POA: Diagnosis not present

## 2021-09-23 DIAGNOSIS — R5383 Other fatigue: Secondary | ICD-10-CM

## 2021-09-23 DIAGNOSIS — E785 Hyperlipidemia, unspecified: Secondary | ICD-10-CM

## 2021-09-23 DIAGNOSIS — R6889 Other general symptoms and signs: Secondary | ICD-10-CM | POA: Diagnosis not present

## 2021-09-23 DIAGNOSIS — E782 Mixed hyperlipidemia: Secondary | ICD-10-CM | POA: Diagnosis not present

## 2021-09-23 MED ORDER — ROSUVASTATIN CALCIUM 20 MG PO TABS: 20.0000 mg | ORAL_TABLET | Freq: Every day | ORAL | 2 refills | Status: DC

## 2021-09-23 MED ORDER — HYDROCHLOROTHIAZIDE 25 MG PO TABS: 25.0000 mg | ORAL_TABLET | Freq: Every day | ORAL | 3 refills | Status: DC

## 2021-09-23 MED ORDER — TRAZODONE HCL 100 MG PO TABS: 100.0000 mg | ORAL_TABLET | Freq: Every day | ORAL | 3 refills | Status: DC

## 2021-09-23 MED ORDER — FAMOTIDINE 40 MG PO TABS: 40.0000 mg | ORAL_TABLET | Freq: Every day | ORAL | 2 refills | Status: DC

## 2021-09-23 NOTE — Progress Notes (Signed)
Subjective:  Patient ID: Renee Maldonado, female    DOB: Nov 04, 1946  Age: 75 y.o. MRN: [id number]  CC: Medical Management of Chronic Issues   HPI Renee Maldonado presents for Goes to sleep well but awakens multiple times during the night. Goes to bathroom since she is awake. Sometimes awake for a while, but gets back to sleep. Awakens in the morning variable as far as feeling refreshed. Snores   Using a cane to walk for steadiness. Gets tired standing at the stove.   Depression screen [facility] 2/9 09/23/2021 08/02/2021 03/19/2021  Decreased Interest 0 0 0  Down, Depressed, Hopeless 0 0 0  PHQ - 2 Score 0 0 0  Altered sleeping 3 - -  Tired, decreased energy 1 - -  Change in appetite 0 - -  Feeling bad or failure about yourself  0 - -  Trouble concentrating 0 - -  Moving slowly or fidgety/restless 0 - -  Suicidal thoughts 0 - -  PHQ-9 Score 4 - -  Difficult doing work/chores Somewhat difficult - -  Some recent data might be hidden    History Renee Maldonado has a past medical history of Anxiety, Arthritis, Cancer ([place]), COPD (chronic obstructive pulmonary disease) ([place]), GERD (gastroesophageal reflux disease), Humerus fracture (2007), Hyperlipidemia, Hypertension, Insomnia, and OA (osteoarthritis) of knee (10/08/2020).   She has a past surgical history that includes Total knee arthroplasty; Left shoulder surgery; Joint replacement; Tubal ligation; Breast surgery (Right, 01/2019); Breast lumpectomy (Right, 02/2019); and Total knee arthroplasty (Right, 10/08/2020).   Her family history includes Alzheimer's disease in her mother; Cancer in her brother; Diabetes in her mother; Hypertension in her father; Stroke in her father.She reports that she has been smoking cigarettes. She has a 10.00 pack-year smoking history. She has never used smokeless tobacco. She reports current alcohol use. She reports that she does not use drugs.    ROS Review of Systems  Constitutional: Negative.   HENT: Negative.    Eyes:   Negative for visual disturbance.  Respiratory:  Negative for shortness of breath.   Cardiovascular:  Negative for chest pain.  Gastrointestinal:  Negative for abdominal pain.  Musculoskeletal:  Negative for arthralgias.   Objective:  BP 140/78   Pulse 65   Temp (!) 97.5 F (36.4 C)   Ht '5\' 2"'  (1.575 m)   Wt 195 lb 12.8 oz (88.8 kg)   SpO2 97%   BMI 35.81 kg/m   BP Readings from Last 3 Encounters:  09/23/21 140/78  03/19/21 129/71  12/26/20 121/74    Wt Readings from Last 3 Encounters:  09/23/21 195 lb 12.8 oz (88.8 kg)  08/02/21 189 lb (85.7 kg)  03/19/21 189 lb 3.2 oz (85.8 kg)     Physical Exam Constitutional:      General: She is not in acute distress.    Appearance: She is well-developed.  Cardiovascular:     Rate and Rhythm: Normal rate and regular rhythm.  Pulmonary:     Breath sounds: Normal breath sounds.  Musculoskeletal:        General: Normal range of motion.  Skin:    General: Skin is warm and dry.  Neurological:     Mental Status: She is alert and oriented to person, place, and time.      Assessment & Plan:   Renee Maldonado was seen today for medical management of chronic issues.  Diagnoses and all orders for this visit:  Mixed hyperlipidemia -     Lipid panel  Primary hypertension -  CBC with Differential/Platelet -     CMP14+EGFR -     hydrochlorothiazide (HYDRODIURIL) 25 MG tablet; Take 1 tablet (25 mg total) by mouth daily.  Hyperlipidemia, unspecified hyperlipidemia type -     rosuvastatin (CRESTOR) 20 MG tablet; Take 1 tablet (20 mg total) by mouth daily.  Other insomnia -     Ambulatory referral to Sleep Studies  Snores -     Ambulatory referral to Sleep Studies  Fatigue, unspecified type -     Vitamin B12  Other orders -     famotidine (PEPCID) 40 MG tablet; Take 1 tablet (40 mg total) by mouth daily. -     traZODone (DESYREL) 100 MG tablet; Take 1 tablet (100 mg total) by mouth at bedtime.      I have discontinued  Renee Maldonado's methocarbamol, sulfamethoxazole-trimethoprim, and traZODone. I have also changed her famotidine, hydrochlorothiazide, and rosuvastatin. Additionally, I am having her start on traZODone. Lastly, I am having her maintain her Restasis, acetaminophen, anastrozole, clindamycin, Chlor-Trimeton, valACYclovir, Breo Ellipta, albuterol, raloxifene, amLODipine, and hydrOXYzine.  Allergies as of 09/23/2021       Reactions   Benadryl [diphenhydramine Hcl]    Penicillins    Had a rash around 20 years ago but unsure if it come from medicine.  Tolerated Cephalosporin Date: 10/09/20.        Medication List        Accurate as of September 23, 2021  9:16 AM. If you have any questions, ask your nurse or doctor.          STOP taking these medications    methocarbamol 500 MG tablet Commonly known as: ROBAXIN Stopped by: Claretta Fraise, MD   sulfamethoxazole-trimethoprim 800-160 MG tablet Commonly known as: BACTRIM DS Stopped by: Claretta Fraise, MD       TAKE these medications    acetaminophen 500 MG tablet Commonly known as: TYLENOL Take 500 mg by mouth every 6 (six) hours as needed for moderate pain.   albuterol 108 (90 Base) MCG/ACT inhaler Commonly known as: VENTOLIN HFA 2 puffs in lungs every 6 hours as needed for wheezing/short of breath.   amLODipine 5 MG tablet Commonly known as: NORVASC TAKE 1 TABLET DAILY   anastrozole 1 MG tablet Commonly known as: ARIMIDEX Take 1 mg by mouth daily.   Breo Ellipta 100-25 MCG/ACT Aepb Generic drug: fluticasone furoate-vilanterol INHALE 1 PUFF ONCE DAILY   Chlor-Trimeton 4 MG tablet Generic drug: chlorpheniramine Take 4 mg by mouth in the morning and at bedtime.   clindamycin 300 MG capsule Commonly known as: CLEOCIN Take 300 mg by mouth See admin instructions. Take 600 mg by mouth one hour prior to dental appointment.   famotidine 40 MG tablet Commonly known as: PEPCID Take 1 tablet (40 mg total) by mouth daily.    hydrochlorothiazide 25 MG tablet Commonly known as: HYDRODIURIL Take 1 tablet (25 mg total) by mouth daily.   hydrOXYzine 25 MG tablet Commonly known as: ATARAX/VISTARIL hydroxyzine HCl 25 mg tablet   raloxifene 60 MG tablet Commonly known as: Evista Take 1 tablet (60 mg total) by mouth daily. For bone health   Restasis 0.05 % ophthalmic emulsion Generic drug: cycloSPORINE Place 1 drop into both eyes 2 (two) times daily as needed (dry eyes).   rosuvastatin 20 MG tablet Commonly known as: CRESTOR Take 1 tablet (20 mg total) by mouth daily.   traZODone 100 MG tablet Commonly known as: DESYREL Take 1 tablet (100 mg total) by mouth at bedtime. What  changed:  medication strength how much to take Changed by: Claretta Fraise, MD   valACYclovir 500 MG tablet Commonly known as: VALTREX TAKE (1) TABLET DAILY AS NEEDED.         Follow-up: Return in about 2 months (around 11/23/2021) for sleep.  Claretta Fraise, M.D.

## 2021-09-24 ENCOUNTER — Other Ambulatory Visit: Payer: Self-pay | Admitting: Family Medicine

## 2021-09-24 LAB — CBC WITH DIFFERENTIAL/PLATELET
Basophils Absolute: 0 10*3/uL (ref 0.0–0.2)
Basos: 1 %
EOS (ABSOLUTE): 0.1 10*3/uL (ref 0.0–0.4)
Eos: 3 %
Hematocrit: 40 % (ref 34.0–46.6)
Hemoglobin: 12.9 g/dL (ref 11.1–15.9)
Immature Grans (Abs): 0 10*3/uL (ref 0.0–0.1)
Immature Granulocytes: 0 %
Lymphocytes Absolute: 1.4 10*3/uL (ref 0.7–3.1)
Lymphs: 26 %
MCH: 27.4 pg (ref 26.6–33.0)
MCHC: 32.3 g/dL (ref 31.5–35.7)
MCV: 85 fL (ref 79–97)
Monocytes Absolute: 0.4 10*3/uL (ref 0.1–0.9)
Monocytes: 9 %
Neutrophils Absolute: 3.2 10*3/uL (ref 1.4–7.0)
Neutrophils: 61 %
Platelets: 270 10*3/uL (ref 150–450)
RBC: 4.7 x10E6/uL (ref 3.77–5.28)
RDW: 13.1 % (ref 11.7–15.4)
WBC: 5.1 10*3/uL (ref 3.4–10.8)

## 2021-09-24 LAB — CMP14+EGFR
ALT: 15 IU/L (ref 0–32)
AST: 15 IU/L (ref 0–40)
Albumin/Globulin Ratio: 2.1 (ref 1.2–2.2)
Albumin: 4.4 g/dL (ref 3.7–4.7)
Alkaline Phosphatase: 84 IU/L (ref 44–121)
BUN/Creatinine Ratio: 15 (ref 12–28)
BUN: 17 mg/dL (ref 8–27)
Bilirubin Total: 0.4 mg/dL (ref 0.0–1.2)
CO2: 29 mmol/L (ref 20–29)
Calcium: 9.8 mg/dL (ref 8.7–10.3)
Chloride: 101 mmol/L (ref 96–106)
Creatinine, Ser: 1.16 mg/dL — ABNORMAL HIGH (ref 0.57–1.00)
Globulin, Total: 2.1 g/dL (ref 1.5–4.5)
Glucose: 113 mg/dL — ABNORMAL HIGH (ref 70–99)
Potassium: 4.4 mmol/L (ref 3.5–5.2)
Sodium: 142 mmol/L (ref 134–144)
Total Protein: 6.5 g/dL (ref 6.0–8.5)
eGFR: 49 mL/min/{1.73_m2} — ABNORMAL LOW (ref >59)

## 2021-09-24 LAB — LIPID PANEL
Chol/HDL Ratio: 3.5 ratio (ref 0.0–4.4)
Cholesterol, Total: 136 mg/dL (ref 100–199)
HDL: 39 mg/dL — ABNORMAL LOW (ref >39)
LDL Chol Calc (NIH): 63 mg/dL (ref 0–99)
Triglycerides: 205 mg/dL — ABNORMAL HIGH (ref 0–149)
VLDL Cholesterol Cal: 34 mg/dL (ref 5–40)

## 2021-09-24 LAB — VITAMIN B12: Vitamin B-12: 157 pg/mL — ABNORMAL LOW (ref 232–1245)

## 2021-09-24 MED ORDER — CYANOCOBALAMIN 1000 MCG/ML IJ SOLN: 1000.0000 ug | INTRAMUSCULAR | 11 refills | Status: DC

## 2021-09-25 ENCOUNTER — Other Ambulatory Visit: Payer: Self-pay | Admitting: *Deleted

## 2021-09-25 DIAGNOSIS — E538 Deficiency of other specified B group vitamins: Secondary | ICD-10-CM

## 2021-09-25 MED ORDER — CYANOCOBALAMIN 1000 MCG/ML IJ SOLN
1000.0000 ug | INTRAMUSCULAR | Status: AC
  Administered 2021-10-03 – 2022-09-08 (×10): 1000 ug via INTRAMUSCULAR

## 2021-09-25 NOTE — Progress Notes (Signed)
Pt calling about labs. Please call back at [phone]

## 2021-10-03 ENCOUNTER — Ambulatory Visit (INDEPENDENT_AMBULATORY_CARE_PROVIDER_SITE_OTHER): Payer: Medicare HMO | Admitting: *Deleted

## 2021-10-03 ENCOUNTER — Other Ambulatory Visit: Payer: Self-pay

## 2021-10-03 DIAGNOSIS — E538 Deficiency of other specified B group vitamins: Secondary | ICD-10-CM

## 2021-10-03 NOTE — Progress Notes (Signed)
Pt tolerated B12 injection to right deltoid with out difficulty

## 2021-10-21 DIAGNOSIS — E538 Deficiency of other specified B group vitamins: Secondary | ICD-10-CM | POA: Diagnosis not present

## 2021-10-21 DIAGNOSIS — R69 Illness, unspecified: Secondary | ICD-10-CM | POA: Diagnosis not present

## 2021-10-21 DIAGNOSIS — Z79811 Long term (current) use of aromatase inhibitors: Secondary | ICD-10-CM | POA: Diagnosis not present

## 2021-10-21 DIAGNOSIS — C50311 Malignant neoplasm of lower-inner quadrant of right female breast: Secondary | ICD-10-CM | POA: Diagnosis not present

## 2021-10-21 DIAGNOSIS — F17210 Nicotine dependence, cigarettes, uncomplicated: Secondary | ICD-10-CM | POA: Diagnosis not present

## 2021-10-21 DIAGNOSIS — Z9189 Other specified personal risk factors, not elsewhere classified: Secondary | ICD-10-CM | POA: Diagnosis not present

## 2021-10-21 DIAGNOSIS — Z5181 Encounter for therapeutic drug level monitoring: Secondary | ICD-10-CM | POA: Diagnosis not present

## 2021-10-21 DIAGNOSIS — Z170 Estrogen receptor positive status [ER+]: Secondary | ICD-10-CM | POA: Diagnosis not present

## 2021-10-28 ENCOUNTER — Ambulatory Visit (INDEPENDENT_AMBULATORY_CARE_PROVIDER_SITE_OTHER): Payer: Medicare HMO | Admitting: Nurse Practitioner

## 2021-10-28 ENCOUNTER — Encounter: Payer: Self-pay | Admitting: Nurse Practitioner

## 2021-10-28 DIAGNOSIS — U071 COVID-19: Secondary | ICD-10-CM | POA: Diagnosis not present

## 2021-10-28 MED ORDER — MOLNUPIRAVIR EUA 200MG CAPSULE: 4.0000 | ORAL_CAPSULE | Freq: Two times a day (BID) | ORAL | 0 refills | Status: AC

## 2021-10-28 NOTE — Assessment & Plan Note (Signed)
Take meds as prescribed - Use a cool mist humidifier  -Use saline nose sprays frequently -Force fluids -For fever or aches or pains- take Tylenol or ibuprofen. -RX Molnupurivir  sent to pharmacy, education provided to patient.  Patient knows to hold statin until completing antiviral. Follow up with worsening unresolved symptoms

## 2021-10-28 NOTE — Progress Notes (Signed)
   Virtual Visit  Note Due to COVID-19 pandemic this visit was conducted virtually. This visit type was conducted due to national recommendations for restrictions regarding the COVID-19 Pandemic (e.g. social distancing, sheltering in place) in an effort to limit this patient's exposure and mitigate transmission in our community. All issues noted in this document were discussed and addressed.  A physical exam was not performed with this format.  I connected with Renee Maldonado on 10/28/21 at 12:20 PM by telephone and verified that I am speaking with the correct person using two identifiers. Renee Maldonado is currently located at home with spouse present during visit. The provider, Ivy Lynn, NP is located in their office at time of visit.  I discussed the limitations, risks, security and privacy concerns of performing an evaluation and management service by telephone and the availability of in person appointments. I also discussed with the patient that there may be a patient responsible charge related to this service. The patient expressed understanding and agreed to proceed.   History and Present Illness:  URI  This is a new problem. The current episode started yesterday. The problem has been unchanged. There has been no fever. Associated symptoms include congestion and coughing. Associated symptoms comments: Positive for COVID-19. She has tried nothing for the symptoms.     Review of Systems  Constitutional:  Negative for chills and fever.  HENT:  Positive for congestion.   Eyes: Negative.   Respiratory:  Positive for cough.   Cardiovascular: Negative.   Skin: Negative.   All other systems reviewed and are negative.   Observations/Objective: Televisit patient not in distress  Assessment and Plan:   Take meds as prescribed - Use a cool mist humidifier  -Use saline nose sprays frequently -Force fluids -For fever or aches or pains- take Tylenol or ibuprofen. -RX Molnupurivir   sent to pharmacy, education provided to patient.  Patient knows to hold statin until completing antiviral. Follow up with worsening unresolved symptoms   Follow Up Instructions: Follow-up with worsening unresolved symptoms.    I discussed the assessment and treatment plan with the patient. The patient was provided an opportunity to ask questions and all were answered. The patient agreed with the plan and demonstrated an understanding of the instructions.   The patient was advised to call back or seek an in-person evaluation if the symptoms worsen or if the condition fails to improve as anticipated.  The above assessment and management plan was discussed with the patient. The patient verbalized understanding of and has agreed to the management plan. Patient is aware to call the clinic if symptoms persist or worsen. Patient is aware when to return to the clinic for a follow-up visit. Patient educated on when it is appropriate to go to the emergency department.   Time call ended: 12:28 PM  I provided 8 minutes of  non face-to-face time during this encounter.    Ivy Lynn, NP

## 2021-11-04 ENCOUNTER — Ambulatory Visit: Payer: Medicare HMO

## 2021-11-04 ENCOUNTER — Telehealth: Payer: Self-pay | Admitting: Family Medicine

## 2021-11-04 NOTE — Telephone Encounter (Signed)
Advised patient regarding isolation protocol

## 2021-11-12 ENCOUNTER — Ambulatory Visit (INDEPENDENT_AMBULATORY_CARE_PROVIDER_SITE_OTHER): Payer: Medicare HMO

## 2021-11-12 DIAGNOSIS — E538 Deficiency of other specified B group vitamins: Secondary | ICD-10-CM

## 2021-11-12 NOTE — Progress Notes (Signed)
Cyanocobalamin injection given to left deltoid.  Medication was patient supplied.  Patient tolerated well.

## 2021-11-17 DIAGNOSIS — C73 Malignant neoplasm of thyroid gland: Secondary | ICD-10-CM

## 2021-11-17 HISTORY — DX: Malignant neoplasm of thyroid gland: C73

## 2021-11-18 ENCOUNTER — Other Ambulatory Visit: Payer: Self-pay | Admitting: Family Medicine

## 2021-11-18 DIAGNOSIS — E785 Hyperlipidemia, unspecified: Secondary | ICD-10-CM

## 2021-11-25 ENCOUNTER — Encounter: Payer: Self-pay | Admitting: Family Medicine

## 2021-11-25 ENCOUNTER — Telehealth: Payer: Self-pay | Admitting: Family Medicine

## 2021-11-25 ENCOUNTER — Ambulatory Visit (INDEPENDENT_AMBULATORY_CARE_PROVIDER_SITE_OTHER): Payer: Medicare HMO | Admitting: Family Medicine

## 2021-11-25 VITALS — BP 138/77 | HR 78 | Temp 98.1°F | Ht 62.0 in | Wt 200.0 lb

## 2021-11-25 DIAGNOSIS — R69 Illness, unspecified: Secondary | ICD-10-CM | POA: Diagnosis not present

## 2021-11-25 DIAGNOSIS — E782 Mixed hyperlipidemia: Secondary | ICD-10-CM | POA: Diagnosis not present

## 2021-11-25 DIAGNOSIS — J439 Emphysema, unspecified: Secondary | ICD-10-CM

## 2021-11-25 DIAGNOSIS — E559 Vitamin D deficiency, unspecified: Secondary | ICD-10-CM

## 2021-11-25 DIAGNOSIS — E538 Deficiency of other specified B group vitamins: Secondary | ICD-10-CM

## 2021-11-25 DIAGNOSIS — I10 Essential (primary) hypertension: Secondary | ICD-10-CM

## 2021-11-25 DIAGNOSIS — F5101 Primary insomnia: Secondary | ICD-10-CM

## 2021-11-25 NOTE — Telephone Encounter (Signed)
Pt would like to be sent a copy of her lab work because she does not use my chart.

## 2021-11-25 NOTE — Progress Notes (Signed)
Subjective:  Patient ID: Renee Maldonado, female    DOB: 26-Apr-1946  Age: 76 y.o. MRN: [id number]  CC: Follow-up   HPI Renee Maldonado presents for   Patient in for follow-up of GERD. Currently asymptomatic taking  PPI daily. There is no chest pain or heartburn. No hematemesis and no melena. No dysphagia or choking. Onset is remote. Progression is stable. Complicating factors, none.   presents for  follow-up of hypertension. Patient has no history of headache chest pain or shortness of breath or recent cough. Patient also denies symptoms of TIA such as focal numbness or weakness. Patient denies side effects from medication. States taking it regularly.   in for follow-up of elevated cholesterol. Doing well without complaints on current medication. Denies side effects of statin including myalgia and arthralgia and nausea. Currently no chest pain, shortness of breath or other cardiovascular related symptoms noted.   Depression screen [facility] 2/9 11/25/2021 09/23/2021 08/02/2021  Decreased Interest 0 0 0  Down, Depressed, Hopeless 0 0 0  PHQ - 2 Score 0 0 0  Altered sleeping 0 3 -  Tired, decreased energy 1 1 -  Change in appetite 1 0 -  Feeling bad or failure about yourself  0 0 -  Trouble concentrating 0 0 -  Moving slowly or fidgety/restless 0 0 -  Suicidal thoughts 0 0 -  PHQ-9 Score 2 4 -  Difficult doing work/chores Not difficult at all Somewhat difficult -  Some recent data might be hidden    History Aidyn has a past medical history of Anxiety, Arthritis, Cancer ([place]), COPD (chronic obstructive pulmonary disease) ([place]), GERD (gastroesophageal reflux disease), Humerus fracture (2007), Hyperlipidemia, Hypertension, Insomnia, and OA (osteoarthritis) of knee (10/08/2020).   She has a past surgical history that includes Total knee arthroplasty; Left shoulder surgery; Joint replacement; Tubal ligation; Breast surgery (Right, 01/2019); Breast lumpectomy (Right, 02/2019); and Total knee  arthroplasty (Right, 10/08/2020).   Her family history includes Alzheimer's disease in her mother; Cancer in her brother; Diabetes in her mother; Hypertension in her father; Stroke in her father.She reports that she has been smoking cigarettes. She has a 10.00 pack-year smoking history. She has never used smokeless tobacco. She reports current alcohol use. She reports that she does not use drugs.    ROS Review of Systems  Constitutional:  Negative for fever.  HENT:  Negative for congestion, rhinorrhea and sore throat.   Respiratory:  Negative for cough and shortness of breath.   Cardiovascular:  Negative for chest pain and palpitations.  Gastrointestinal:  Negative for abdominal pain.  Musculoskeletal:  Negative for arthralgias and myalgias.   Objective:  BP 138/77    Pulse 78    Temp 98.1 F (36.7 C)    Ht '5\' 2"'  (1.575 m)    Wt 200 lb (90.7 kg)    SpO2 94%    BMI 36.58 kg/m   BP Readings from Last 3 Encounters:  11/25/21 138/77  09/23/21 140/78  03/19/21 129/71    Wt Readings from Last 3 Encounters:  11/25/21 200 lb (90.7 kg)  09/23/21 195 lb 12.8 oz (88.8 kg)  08/02/21 189 lb (85.7 kg)     Physical Exam Constitutional:      General: She is not in acute distress.    Appearance: She is well-developed.  HENT:     Head: Normocephalic and atraumatic.  Eyes:     Conjunctiva/sclera: Conjunctivae normal.     Pupils: Pupils are equal, round, and reactive to light.  Neck:  Thyroid: No thyromegaly.  Cardiovascular:     Rate and Rhythm: Normal rate and regular rhythm.     Heart sounds: Normal heart sounds. No murmur heard. Pulmonary:     Effort: Pulmonary effort is normal. No respiratory distress.     Breath sounds: Normal breath sounds. No wheezing or rales.  Abdominal:     General: Bowel sounds are normal. There is no distension.     Palpations: Abdomen is soft.     Tenderness: There is no abdominal tenderness.  Musculoskeletal:        General: Normal range of  motion.     Cervical back: Normal range of motion and neck supple.  Lymphadenopathy:     Cervical: No cervical adenopathy.  Skin:    General: Skin is warm and dry.  Neurological:     Mental Status: She is alert and oriented to person, place, and time.  Psychiatric:        Behavior: Behavior normal.        Thought Content: Thought content normal.        Judgment: Judgment normal.      Assessment & Plan:   Damesha was seen today for follow-up.  Diagnoses and all orders for this visit:  Pulmonary emphysema, unspecified emphysema type ([place]) -     CBC with Differential/Platelet -     CMP14+EGFR  Primary hypertension -     CBC with Differential/Platelet -     CMP14+EGFR  Mixed hyperlipidemia -     CBC with Differential/Platelet -     CMP14+EGFR -     Lipid panel  Primary insomnia -     CBC with Differential/Platelet -     CMP14+EGFR  Vitamin D deficiency -     CBC with Differential/Platelet -     CMP14+EGFR -     VITAMIN D 25 Hydroxy (Vit-D Deficiency, Fractures)  B12 deficiency -     Vitamin B12       I am having Sarinah Klosinski maintain her Restasis, acetaminophen, anastrozole, clindamycin, Chlor-Trimeton, valACYclovir, Breo Ellipta, albuterol, raloxifene, amLODipine, hydrOXYzine, famotidine, hydrochlorothiazide, traZODone, cyanocobalamin, and rosuvastatin. We will continue to administer cyanocobalamin.  Allergies as of 11/25/2021       Reactions   Benadryl [diphenhydramine Hcl]    Penicillins    Had a rash around 20 years ago but unsure if it come from medicine.  Tolerated Cephalosporin Date: 10/09/20.        Medication List        Accurate as of November 25, 2021  9:07 AM. If you have any questions, ask your nurse or doctor.          acetaminophen 500 MG tablet Commonly known as: TYLENOL Take 500 mg by mouth every 6 (six) hours as needed for moderate pain.   albuterol 108 (90 Base) MCG/ACT inhaler Commonly known as: VENTOLIN HFA 2 puffs in lungs  every 6 hours as needed for wheezing/short of breath.   amLODipine 5 MG tablet Commonly known as: NORVASC TAKE 1 TABLET DAILY   anastrozole 1 MG tablet Commonly known as: ARIMIDEX Take 1 mg by mouth daily.   Breo Ellipta 100-25 MCG/ACT Aepb Generic drug: fluticasone furoate-vilanterol INHALE 1 PUFF ONCE DAILY   Chlor-Trimeton 4 MG tablet Generic drug: chlorpheniramine Take 4 mg by mouth in the morning and at bedtime.   clindamycin 300 MG capsule Commonly known as: CLEOCIN Take 300 mg by mouth See admin instructions. Take 600 mg by mouth one hour prior to dental appointment.  cyanocobalamin 1000 MCG/ML injection Commonly known as: (VITAMIN B-12) Inject 1 mL (1,000 mcg total) into the skin every 30 (thirty) days.   famotidine 40 MG tablet Commonly known as: PEPCID Take 1 tablet (40 mg total) by mouth daily.   hydrochlorothiazide 25 MG tablet Commonly known as: HYDRODIURIL Take 1 tablet (25 mg total) by mouth daily.   hydrOXYzine 25 MG tablet Commonly known as: ATARAX hydroxyzine HCl 25 mg tablet   raloxifene 60 MG tablet Commonly known as: Evista Take 1 tablet (60 mg total) by mouth daily. For bone health   Restasis 0.05 % ophthalmic emulsion Generic drug: cycloSPORINE Place 1 drop into both eyes 2 (two) times daily as needed (dry eyes).   rosuvastatin 20 MG tablet Commonly known as: CRESTOR TAKE 1 TABLET DAILY   traZODone 100 MG tablet Commonly known as: DESYREL Take 1 tablet (100 mg total) by mouth at bedtime.   valACYclovir 500 MG tablet Commonly known as: VALTREX TAKE (1) TABLET DAILY AS NEEDED.         Follow-up: Return in about 6 months (around 05/25/2022).  Claretta Fraise, M.D.

## 2021-11-25 NOTE — Telephone Encounter (Signed)
Patient just had done today not back to be mailed

## 2021-11-26 LAB — CBC WITH DIFFERENTIAL/PLATELET
Basophils Absolute: 0 10*3/uL (ref 0.0–0.2)
Basos: 1 %
EOS (ABSOLUTE): 0.2 10*3/uL (ref 0.0–0.4)
Eos: 3 %
Hematocrit: 36.7 % (ref 34.0–46.6)
Hemoglobin: 12.3 g/dL (ref 11.1–15.9)
Immature Grans (Abs): 0 10*3/uL (ref 0.0–0.1)
Immature Granulocytes: 1 %
Lymphocytes Absolute: 1.1 10*3/uL (ref 0.7–3.1)
Lymphs: 24 %
MCH: 28 pg (ref 26.6–33.0)
MCHC: 33.5 g/dL (ref 31.5–35.7)
MCV: 83 fL (ref 79–97)
Monocytes Absolute: 0.4 10*3/uL (ref 0.1–0.9)
Monocytes: 10 %
Neutrophils Absolute: 2.7 10*3/uL (ref 1.4–7.0)
Neutrophils: 61 %
Platelets: 245 10*3/uL (ref 150–450)
RBC: 4.4 x10E6/uL (ref 3.77–5.28)
RDW: 13.4 % (ref 11.7–15.4)
WBC: 4.4 10*3/uL (ref 3.4–10.8)

## 2021-11-26 LAB — CMP14+EGFR
ALT: 16 IU/L (ref 0–32)
AST: 19 IU/L (ref 0–40)
Albumin/Globulin Ratio: 2.5 — ABNORMAL HIGH (ref 1.2–2.2)
Albumin: 4.5 g/dL (ref 3.7–4.7)
Alkaline Phosphatase: 87 IU/L (ref 44–121)
BUN/Creatinine Ratio: 16 (ref 12–28)
BUN: 20 mg/dL (ref 8–27)
Bilirubin Total: 0.3 mg/dL (ref 0.0–1.2)
CO2: 28 mmol/L (ref 20–29)
Calcium: 10 mg/dL (ref 8.7–10.3)
Chloride: 104 mmol/L (ref 96–106)
Creatinine, Ser: 1.23 mg/dL — ABNORMAL HIGH (ref 0.57–1.00)
Globulin, Total: 1.8 g/dL (ref 1.5–4.5)
Glucose: 126 mg/dL — ABNORMAL HIGH (ref 70–99)
Potassium: 4.8 mmol/L (ref 3.5–5.2)
Sodium: 145 mmol/L — ABNORMAL HIGH (ref 134–144)
Total Protein: 6.3 g/dL (ref 6.0–8.5)
eGFR: 46 mL/min/{1.73_m2} — ABNORMAL LOW (ref >59)

## 2021-11-26 LAB — VITAMIN D 25 HYDROXY (VIT D DEFICIENCY, FRACTURES): Vit D, 25-Hydroxy: 50.7 ng/mL (ref 30.0–100.0)

## 2021-11-26 LAB — LIPID PANEL
Chol/HDL Ratio: 2.8 ratio (ref 0.0–4.4)
Cholesterol, Total: 119 mg/dL (ref 100–199)
HDL: 42 mg/dL (ref >39)
LDL Chol Calc (NIH): 56 mg/dL (ref 0–99)
Triglycerides: 113 mg/dL (ref 0–149)
VLDL Cholesterol Cal: 21 mg/dL (ref 5–40)

## 2021-11-26 LAB — VITAMIN B12: Vitamin B-12: 302 pg/mL (ref 232–1245)

## 2021-11-29 NOTE — Progress Notes (Signed)
Hello Khrystyne,  Your lab result is normal and/or stable.Some minor variations that are not significant are commonly marked abnormal, but do not represent any medical problem for you.  Best regards, Claretta Fraise, M.D.

## 2021-12-10 ENCOUNTER — Institutional Professional Consult (permissible substitution): Payer: Medicare HMO | Admitting: Neurology

## 2021-12-13 ENCOUNTER — Ambulatory Visit: Payer: Medicare HMO

## 2021-12-17 ENCOUNTER — Ambulatory Visit (INDEPENDENT_AMBULATORY_CARE_PROVIDER_SITE_OTHER): Payer: Medicare HMO | Admitting: *Deleted

## 2021-12-17 DIAGNOSIS — E538 Deficiency of other specified B group vitamins: Secondary | ICD-10-CM

## 2021-12-17 NOTE — Progress Notes (Signed)
Pt given B12 injection IM right deltoid and tolerated well.

## 2021-12-28 ENCOUNTER — Other Ambulatory Visit: Payer: Self-pay | Admitting: Family Medicine

## 2021-12-28 DIAGNOSIS — J439 Emphysema, unspecified: Secondary | ICD-10-CM

## 2022-01-03 ENCOUNTER — Encounter (HOSPITAL_COMMUNITY): Payer: Self-pay | Admitting: Emergency Medicine

## 2022-01-03 ENCOUNTER — Emergency Department (HOSPITAL_COMMUNITY): Payer: Medicare HMO

## 2022-01-03 ENCOUNTER — Other Ambulatory Visit: Payer: Self-pay

## 2022-01-03 ENCOUNTER — Emergency Department (HOSPITAL_COMMUNITY)
Admission: EM | Admit: 2022-01-03 | Discharge: 2022-01-04 | Disposition: A | Discharge status: Home / Self Care | Payer: Medicare HMO | Attending: Emergency Medicine | Admitting: Emergency Medicine

## 2022-01-03 DIAGNOSIS — I10 Essential (primary) hypertension: Secondary | ICD-10-CM | POA: Diagnosis not present

## 2022-01-03 DIAGNOSIS — Z853 Personal history of malignant neoplasm of breast: Secondary | ICD-10-CM | POA: Insufficient documentation

## 2022-01-03 DIAGNOSIS — S0003XA Contusion of scalp, initial encounter: Secondary | ICD-10-CM | POA: Diagnosis not present

## 2022-01-03 DIAGNOSIS — E041 Nontoxic single thyroid nodule: Secondary | ICD-10-CM

## 2022-01-03 DIAGNOSIS — M47812 Spondylosis without myelopathy or radiculopathy, cervical region: Secondary | ICD-10-CM | POA: Diagnosis not present

## 2022-01-03 DIAGNOSIS — W19XXXA Unspecified fall, initial encounter: Secondary | ICD-10-CM

## 2022-01-03 DIAGNOSIS — S0990XA Unspecified injury of head, initial encounter: Secondary | ICD-10-CM | POA: Diagnosis not present

## 2022-01-03 DIAGNOSIS — W01198A Fall on same level from slipping, tripping and stumbling with subsequent striking against other object, initial encounter: Secondary | ICD-10-CM | POA: Diagnosis not present

## 2022-01-03 DIAGNOSIS — Z79899 Other long term (current) drug therapy: Secondary | ICD-10-CM | POA: Insufficient documentation

## 2022-01-03 DIAGNOSIS — R911 Solitary pulmonary nodule: Secondary | ICD-10-CM | POA: Diagnosis not present

## 2022-01-03 NOTE — ED Triage Notes (Signed)
Pt to the ED after [place].  Pt has a hematoma on the back of her head. Pt denies being on blood thinners or LOC.

## 2022-01-04 MED ORDER — ACETAMINOPHEN 325 MG PO TABS: 650.0000 mg | ORAL_TABLET | Freq: Once | ORAL | Status: DC

## 2022-01-04 NOTE — ED Notes (Signed)
Pt standing at the desk waiting to be discharged.

## 2022-01-04 NOTE — Discharge Instructions (Addendum)
Apply ice for 30 minutes at a time, 4 times a day.  You may take ibuprofen, naproxen, or acetaminophen as needed for pain.  Your CT scan of the neck showed a nodule in the lung, and also a nodule in your thyroid.  You will need to arrange to have some additional testing for these.  The radiologist is recommending a CT scan be obtained of your chest, and an ultrasound of your thyroid.  Your primary care provider can set those up.

## 2022-01-04 NOTE — ED Provider Notes (Signed)
[facility] EMERGENCY DEPARTMENT Provider Note   CSN: [id number] Arrival date & time: 01/03/22  2046     History  Chief Complaint  Patient presents with   Fall    Renee Maldonado is a 76 y.o. female.  The history is provided by the patient.  Fall She has history of hypertension, hyperlipidemia, breast cancer and comes in after falling.  She had bent down and lost her balance as she was getting up.  She hit the back of her head on the ground without loss of consciousness.  She was ambulatory following this.  She denies visual change, nausea, vomiting.   Home Medications Prior to Admission medications   Medication Sig Start Date End Date Taking? Authorizing Provider  acetaminophen (TYLENOL) 500 MG tablet Take 500 mg by mouth every 6 (six) hours as needed for moderate pain.     [provider]  albuterol (VENTOLIN HFA) 108 (90 Base) MCG/ACT inhaler 2 puffs in lungs every 6 hours as needed for wheezing/short of breath. 12/13/20   Claretta Fraise, MD  amLODipine (NORVASC) 5 MG tablet TAKE 1 TABLET DAILY 07/29/21   Claretta Fraise, MD  anastrozole (ARIMIDEX) 1 MG tablet Take 1 mg by mouth daily.  07/22/19   [provider]  chlorpheniramine (CHLOR-TRIMETON) 4 MG tablet Take 4 mg by mouth in the morning and at bedtime.     [provider]  clindamycin (CLEOCIN) 300 MG capsule Take 300 mg by mouth See admin instructions. Take 600 mg by mouth one hour prior to dental appointment. 12/13/19   [provider]  cyanocobalamin (,VITAMIN B-12,) 1000 MCG/ML injection Inject 1 mL (1,000 mcg total) into the skin every 30 (thirty) days. 09/24/21   Claretta Fraise, MD  famotidine (PEPCID) 40 MG tablet Take 1 tablet (40 mg total) by mouth daily. 09/23/21   Claretta Fraise, MD  fluticasone furoate-vilanterol (BREO ELLIPTA) 100-25 MCG/ACT AEPB INHALE 1 PUFF ONCE DAILY 12/30/21   Claretta Fraise, MD  hydrochlorothiazide (HYDRODIURIL) 25 MG tablet Take 1 tablet (25 mg total) by mouth  daily. 09/23/21   Claretta Fraise, MD  hydrOXYzine (ATARAX/VISTARIL) 25 MG tablet hydroxyzine HCl 25 mg tablet    [provider]  raloxifene (EVISTA) 60 MG tablet Take 1 tablet (60 mg total) by mouth daily. For bone health 03/19/21   Claretta Fraise, MD  RESTASIS 0.05 % ophthalmic emulsion Place 1 drop into both eyes 2 (two) times daily as needed (dry eyes).  01/18/14   [provider]  rosuvastatin (CRESTOR) 20 MG tablet TAKE 1 TABLET DAILY 11/19/21   Claretta Fraise, MD  traZODone (DESYREL) 100 MG tablet Take 1 tablet (100 mg total) by mouth at bedtime. 09/23/21   Claretta Fraise, MD  valACYclovir (VALTREX) 500 MG tablet TAKE (1) TABLET DAILY AS NEEDED. 12/13/20   Claretta Fraise, MD      Allergies    Benadryl [diphenhydramine hcl] and Penicillins    Review of Systems   Review of Systems  All other systems reviewed and are negative.  Physical Exam Updated Vital Signs BP (!) 148/72 (BP Location: Right Arm)    Pulse 82    Temp 98 F (36.7 C) (Oral)    Resp 18    Ht 5\' 2"  (1.575 m)    Wt 90.7 kg    SpO2 94%    BMI 36.57 kg/m  Physical Exam Vitals and nursing note reviewed.  76 year old female, resting comfortably and in no acute distress. Vital signs are significant for mildly elevated  blood pressure. Oxygen saturation is 94%, which is normal. Head is normocephalic.  Hematoma is present on the left side of the occiput and is mildly tender. PERRLA, EOMI. Oropharynx is clear. Neck is nontender without adenopathy or JVD. Back is nontender and there is no CVA tenderness. Lungs are clear without rales, wheezes, or rhonchi. Chest is nontender. Heart has regular rate and rhythm without murmur. Abdomen is soft, flat, nontender. Extremities have no cyanosis or edema, full range of motion is present. Skin is warm and dry without rash. Neurologic: Mental status is normal, cranial nerves are intact, moves all extremities equally.  ED Results / Procedures / Treatments    Radiology CT  Head Wo Contrast  Result Date: 01/03/2022 CLINICAL DATA:  [place], hit head, posterior hematoma EXAM: CT HEAD WITHOUT CONTRAST TECHNIQUE: Contiguous axial images were obtained from the base of the skull through the vertex without intravenous contrast. RADIATION DOSE REDUCTION: This exam was performed according to the departmental dose-optimization program which includes automated exposure control, adjustment of the mA and/or kV according to patient size and/or use of iterative reconstruction technique. COMPARISON:  12/27/2009 FINDINGS: Brain: No acute infarct or hemorrhage. Lateral ventricles and midline structures are unremarkable. No acute extra-axial fluid collections. No mass effect. Vascular: No hyperdense vessel or unexpected calcification. Skull: Left parietal scalp hematoma. No underlying fracture. Remainder of the calvarium is unremarkable. Sinuses/Orbits: No acute finding. Other: None. IMPRESSION: 1. Left parietal scalp hematoma. 2. No acute intracranial process. Electronically Signed   By: Randa Ngo M.D.   On: 01/03/2022 23:44   CT Cervical Spine Wo Contrast  Result Date: 01/03/2022 CLINICAL DATA:  [place], hit head, posterior scalp hematoma EXAM: CT CERVICAL SPINE WITHOUT CONTRAST TECHNIQUE: Multidetector CT imaging of the cervical spine was performed without intravenous contrast. Multiplanar CT image reconstructions were also generated. RADIATION DOSE REDUCTION: This exam was performed according to the departmental dose-optimization program which includes automated exposure control, adjustment of the mA and/or kV according to patient size and/or use of iterative reconstruction technique. COMPARISON:  None. FINDINGS: Alignment: Alignment is anatomic. Skull base and vertebrae: No acute fracture. No primary bone lesion or focal pathologic process. Soft tissues and spinal canal: No prevertebral fluid or swelling. No visible canal hematoma. Disc levels: Mild cervical spondylosis most pronounced at C5-6.  No significant bony encroachment upon the central canal or neural foramina. Upper chest: Airway is patent. Curvilinear somewhat spiculated density within the right apex measures 1.1 x 0.8 by 0.9 cm. This may extend to the pleural surface and could reflect scarring, though is incompletely evaluated on this study. Recommend follow-up unenhanced chest CT for complete characterization. 2.1 x 2.0 by 2.6 cm nodule in the right paratracheal region has similar attenuation to the thyroid and appears contiguous with the right lobe, consistent with exophytic thyroid nodule. Follow-up thyroid ultrasound may be useful for further evaluation. Other: Reconstructed images demonstrate no additional findings. IMPRESSION: 1. No acute cervical spine fracture. 2. 10 mm suspicious right solid pulmonary nodule within the upper lobe detected on incomplete chest CT. Recommend non-contrast Chest CT for further evaluation. These guidelines do not apply to immunocompromised patients and patients with cancer. Follow up in patients with significant comorbidities as clinically warranted. For lung cancer screening, adhere to Lung-RADS guidelines. Reference: Radiology. 2017; 284(1):228-43. 3. 2.6 cm exophytic right lobe thyroid nodule. Recommend nonemergent follow-up thyroid US if this has not been previously evaluated. (Ref: J Am Coll Radiol. 2015 Feb;12(2): 143-50). Electronically Signed   By: Diana Eves.D.  On: 01/03/2022 23:50    Procedures Procedures    Medications Ordered in ED Medications - No data to display  ED Course/ Medical Decision Making/ A&P                           Medical Decision Making Amount and/or Complexity of Data Reviewed Radiology: ordered.   Fall with injury to occiput.  No evidence of any neurologic injury.  She is sent for CT of head and cervical spine which show no evidence of acute injury, but incidental finding of right upper lobe lung nodule and thyroid nodule.  Patient is a cigarette smoker  and is at risk for lung cancer.  Radiologist has recommended dedicated CT of the chest and thyroid ultrasound.  I have offered the patient the option of getting CT of the chest here as part of this ED visit, but she prefers to follow-up with her PCP and arrange those test as an outpatient.  She is advised to apply ice to her scalp, take over-the-counter analgesics as needed for pain.  Importance of follow-up chest CT and thyroid ultrasound was stressed.        Final Clinical Impression(s) / ED Diagnoses Final diagnoses:  Fall, initial encounter  Contusion of occipital region of scalp, initial encounter  Nodule of upper lobe of right lung  Thyroid nodule    Rx / DC Orders ED Discharge Orders     None         Delora Fuel, MD 50/27/74 [phone]

## 2022-01-09 ENCOUNTER — Telehealth: Payer: Self-pay | Admitting: Family Medicine

## 2022-01-09 NOTE — Telephone Encounter (Signed)
Key: [id number] - PA Case ID: [id number] - Rx #: [id number] Need help? Call us at [phone] Status Sent to [employer]  Fluticasone Furoate-Vilanterol 100-25MCG/ACT aerosol powder

## 2022-01-10 NOTE — Telephone Encounter (Signed)
Deniedon February 23 Your request has been denied  Why did we deny your request? We denied this request under Medicare Part D because: The requested drug is not on your plan's formulary  (list of covered drugs). Your Medicare Part D drug plan was asked to cover a drug that is not on the formulary  (this is called a formulary exception). Your prescriber did not provide the detailed information that is required in  order to approve the request. To receive a formulary exception, per the Part D Coverage Determination  guidance Section 40.5.2 and 40.5.3, your prescriber must provide information that documents at least one of  the following has occurred:  - You have tried the formulary drugs for the treatment of your condition and they did not work for you. OR - The formulary drugs could cause adverse effects. OR - The formulary drugs would be less effective for your condition than the requested drug.  Talk to your prescriber to see if the following covered alternative(s) would be right for you:  Advair Diskus inhaler (quantity limit of 60 doses every 30 days)

## 2022-01-13 ENCOUNTER — Ambulatory Visit (INDEPENDENT_AMBULATORY_CARE_PROVIDER_SITE_OTHER): Payer: Medicare HMO | Admitting: Family Medicine

## 2022-01-13 ENCOUNTER — Other Ambulatory Visit: Payer: Self-pay | Admitting: Family Medicine

## 2022-01-13 VITALS — BP 139/72 | HR 81 | Temp 97.9°F | Ht 62.0 in | Wt 195.8 lb

## 2022-01-13 DIAGNOSIS — R918 Other nonspecific abnormal finding of lung field: Secondary | ICD-10-CM | POA: Diagnosis not present

## 2022-01-13 DIAGNOSIS — R911 Solitary pulmonary nodule: Secondary | ICD-10-CM | POA: Diagnosis not present

## 2022-01-13 DIAGNOSIS — E041 Nontoxic single thyroid nodule: Secondary | ICD-10-CM

## 2022-01-13 MED ORDER — BUDESONIDE-FORMOTEROL FUMARATE 160-4.5 MCG/ACT IN AERO: 2.0000 | INHALATION_SPRAY | Freq: Two times a day (BID) | RESPIRATORY_TRACT | 5 refills | Status: DC

## 2022-01-13 NOTE — Telephone Encounter (Signed)
Pt aware.

## 2022-01-13 NOTE — Telephone Encounter (Signed)
I sent in symbicort as a substitute. Remind her it is taken differently, 2 puffs BID

## 2022-01-13 NOTE — Progress Notes (Signed)
Subjective:  Patient ID: Renee Maldonado, female    DOB: 1946-11-11  Age: 76 y.o. MRN: [id number]  CC: Follow-up (Per pt e/r follow up on fall from last week)   HPI Renee Maldonado presents for E.D. follow up from 9 days ago. Bent over and lost balance when getting up. Fell backward. Having a nagging headache still. ON and off since the  fall.   Imaging noted to show nodule on thyroid. Pt. Feels a lump in throat. Tries to clear her throat and it won't clear.   Depression screen [facility] 2/9 01/13/2022 11/25/2021 09/23/2021  Decreased Interest 0 0 0  Down, Depressed, Hopeless 0 0 0  PHQ - 2 Score 0 0 0  Altered sleeping 0 0 3  Tired, decreased energy 2 1 1   Change in appetite 0 1 0  Feeling bad or failure about yourself  0 0 0  Trouble concentrating 0 0 0  Moving slowly or fidgety/restless 0 0 0  Suicidal thoughts 0 0 0  PHQ-9 Score 2 2 4   Difficult doing work/chores Somewhat difficult Not difficult at all Somewhat difficult  Some recent data might be hidden    History Renee Maldonado has a past medical history of Anxiety, Arthritis, Cancer ([place]), COPD (chronic obstructive pulmonary disease) ([place]), GERD (gastroesophageal reflux disease), Humerus fracture (2007), Hyperlipidemia, Hypertension, Insomnia, and OA (osteoarthritis) of knee (10/08/2020).   She has a past surgical history that includes Total knee arthroplasty; Left shoulder surgery; Joint replacement; Tubal ligation; Breast surgery (Right, 01/2019); Breast lumpectomy (Right, 02/2019); and Total knee arthroplasty (Right, 10/08/2020).   Her family history includes Alzheimer's disease in her mother; Cancer in her brother; Diabetes in her mother; Hypertension in her father; Stroke in her father.She reports that she has been smoking cigarettes. She has a 10.00 pack-year smoking history. She has never used smokeless tobacco. She reports current alcohol use. She reports that she does not use drugs.    ROS Review of Systems  Constitutional: Negative.    HENT:  Positive for trouble swallowing.   Eyes:  Negative for visual disturbance.  Respiratory:  Negative for shortness of breath.   Cardiovascular:  Negative for chest pain.  Gastrointestinal:  Negative for abdominal pain.  Musculoskeletal:  Negative for arthralgias.   Objective:  BP 139/72    Pulse 81    Temp 97.9 F (36.6 C) (Temporal)    Ht 5\' 2"  (1.575 m)    Wt 195 lb 12.8 oz (88.8 kg)    SpO2 95%    BMI 35.81 kg/m   BP Readings from Last 3 Encounters:  01/13/22 139/72  01/03/22 (!) 148/72  11/25/21 138/77    Wt Readings from Last 3 Encounters:  01/13/22 195 lb 12.8 oz (88.8 kg)  01/03/22 199 lb 15.3 oz (90.7 kg)  11/25/21 200 lb (90.7 kg)     Physical Exam Constitutional:      General: She is not in acute distress.    Appearance: She is well-developed.  Cardiovascular:     Rate and Rhythm: Normal rate and regular rhythm.  Pulmonary:     Breath sounds: Normal breath sounds.  Skin:    General: Skin is warm and dry.  Neurological:     Mental Status: She is alert and oriented to person, place, and time.      Assessment & Plan:   Renee Maldonado was seen today for follow-up.  Diagnoses and all orders for this visit:  Solitary pulmonary nodule  Abnormal findings on diagnostic imaging of lung -  US SOFT TISSUE HEAD & NECK (NON-THYROID); Future -     CT Chest High Resolution; Future  Thyroid nodule       I am having Renee Maldonado maintain her Restasis, acetaminophen, anastrozole, clindamycin, Chlor-Trimeton, valACYclovir, albuterol, raloxifene, amLODipine, hydrOXYzine, famotidine, hydrochlorothiazide, traZODone, cyanocobalamin, rosuvastatin, and budesonide-formoterol. We will continue to administer cyanocobalamin.  Allergies as of 01/13/2022       Reactions   Benadryl [diphenhydramine Hcl]    Penicillins    Had a rash around 20 years ago but unsure if it come from medicine.  Tolerated Cephalosporin Date: 10/09/20.        Medication List         Accurate as of January 13, 2022  7:58 PM. If you have any questions, ask your nurse or doctor.          STOP taking these medications    fluticasone furoate-vilanterol 100-25 MCG/ACT Aepb Commonly known as: BREO ELLIPTA Stopped by: Renee Fraise, MD       TAKE these medications    acetaminophen 500 MG tablet Commonly known as: TYLENOL Take 500 mg by mouth every 6 (six) hours as needed for moderate pain.   albuterol 108 (90 Base) MCG/ACT inhaler Commonly known as: VENTOLIN HFA 2 puffs in lungs every 6 hours as needed for wheezing/short of breath.   amLODipine 5 MG tablet Commonly known as: NORVASC TAKE 1 TABLET DAILY   anastrozole 1 MG tablet Commonly known as: ARIMIDEX Take 1 mg by mouth daily.   budesonide-formoterol 160-4.5 MCG/ACT inhaler Commonly known as: SYMBICORT Inhale 2 puffs into the lungs 2 (two) times daily. Started by: Renee Fraise, MD   Chlor-Trimeton 4 MG tablet Generic drug: chlorpheniramine Take 4 mg by mouth in the morning and at bedtime.   clindamycin 300 MG capsule Commonly known as: CLEOCIN Take 300 mg by mouth See admin instructions. Take 600 mg by mouth one hour prior to dental appointment.   cyanocobalamin 1000 MCG/ML injection Commonly known as: (VITAMIN B-12) Inject 1 mL (1,000 mcg total) into the skin every 30 (thirty) days.   famotidine 40 MG tablet Commonly known as: PEPCID Take 1 tablet (40 mg total) by mouth daily.   hydrochlorothiazide 25 MG tablet Commonly known as: HYDRODIURIL Take 1 tablet (25 mg total) by mouth daily.   hydrOXYzine 25 MG tablet Commonly known as: ATARAX hydroxyzine HCl 25 mg tablet   raloxifene 60 MG tablet Commonly known as: Evista Take 1 tablet (60 mg total) by mouth daily. For bone health   Restasis 0.05 % ophthalmic emulsion Generic drug: cycloSPORINE Place 1 drop into both eyes 2 (two) times daily as needed (dry eyes).   rosuvastatin 20 MG tablet Commonly known as: CRESTOR TAKE 1 TABLET  DAILY   traZODone 100 MG tablet Commonly known as: DESYREL Take 1 tablet (100 mg total) by mouth at bedtime.   valACYclovir 500 MG tablet Commonly known as: VALTREX TAKE (1) TABLET DAILY AS NEEDED.         Follow-up: No follow-ups on file.  Renee Maldonado, M.D.

## 2022-01-15 ENCOUNTER — Telehealth: Payer: Self-pay

## 2022-01-15 NOTE — Telephone Encounter (Signed)
Attempted to do a PA for Symbicort 160-4.71mcg,ACT aerosol but message came back stating that PA was not required. Pharmacy notified ? ? ? ?Information regarding your request ? ?The patient currently has access to the requested medication and a Prior Authorization is not needed for the patient/medication. ?

## 2022-01-17 ENCOUNTER — Telehealth: Payer: Self-pay

## 2022-01-17 NOTE — Telephone Encounter (Signed)
Sherry with [employer] called and left message to let you know that she had received a call from the patient about her Breo inhaler.  The patient told her that she was told either by [place] or the pharmacy that this medication was not covered.  Judeen Hammans is calling to let you know this is covered by [employer].  Looks like you discontinued this at her last office visit with you. ?

## 2022-01-19 ENCOUNTER — Other Ambulatory Visit: Payer: Self-pay | Admitting: Family Medicine

## 2022-01-19 DIAGNOSIS — J439 Emphysema, unspecified: Secondary | ICD-10-CM

## 2022-01-19 MED ORDER — FLUTICASONE FUROATE-VILANTEROL 100-25 MCG/ACT IN AEPB: INHALATION_SPRAY | RESPIRATORY_TRACT | 1 refills | Status: DC

## 2022-01-19 NOTE — Telephone Encounter (Signed)
Please let Ms. Hynson know that I renewed the Breo for her based on insurance rep statement that they will cover it. DC the symbicort. ?

## 2022-01-20 ENCOUNTER — Other Ambulatory Visit: Payer: Self-pay | Admitting: Family Medicine

## 2022-01-20 ENCOUNTER — Ambulatory Visit (INDEPENDENT_AMBULATORY_CARE_PROVIDER_SITE_OTHER): Payer: Medicare HMO | Admitting: *Deleted

## 2022-01-20 DIAGNOSIS — E538 Deficiency of other specified B group vitamins: Secondary | ICD-10-CM

## 2022-01-20 MED ORDER — BUDESONIDE-FORMOTEROL FUMARATE 160-4.5 MCG/ACT IN AERO: 2.0000 | INHALATION_SPRAY | Freq: Two times a day (BID) | RESPIRATORY_TRACT | 3 refills | Status: DC

## 2022-01-20 MED ORDER — CYANOCOBALAMIN 1000 MCG/ML IJ SOLN
1000.0000 ug | Freq: Once | INTRAMUSCULAR | Status: AC
  Administered 2022-01-20: 1000 ug via INTRAMUSCULAR

## 2022-01-20 NOTE — Telephone Encounter (Signed)
The CT scan to check out the nodule has been ordered.  It is just waiting to be scheduled at this point.  I canceled the [facility] prescription and sent in refills for the Symbicort. ?

## 2022-01-20 NOTE — Telephone Encounter (Signed)
Pt states that there has been a mix up. She received a call from [place] and does not know why. She believes that they thought she wanted to d/c her symbicort. Pt want a prescription for symbicort. It works well for her. The Breo works well too but copay is $47 monthly and she does not want to continue it. ? ?Pt would also like to make [facility] aware of an ER visit wear nodule were seen in lungs. Wants to make sure she has the appropriate follow up for that. ?

## 2022-01-20 NOTE — Telephone Encounter (Signed)
Returned call, busy signal only ?

## 2022-01-21 NOTE — Telephone Encounter (Signed)
PATIENT AWARE, PATIENT STATES [employer] CALLED HER YESTERDAY AND SAID CT IS APPROVED, IF SHE NOT AVAILABLE WHEN REFERRALS CALLS WITH APPOINTMENT OK TO LEAVE APPOINTMENT WITH HUSBAND OR VOICEMAIL ?

## 2022-01-29 ENCOUNTER — Other Ambulatory Visit: Payer: Self-pay | Admitting: Family Medicine

## 2022-01-29 DIAGNOSIS — I10 Essential (primary) hypertension: Secondary | ICD-10-CM

## 2022-02-03 ENCOUNTER — Telehealth: Payer: Self-pay | Admitting: Family Medicine

## 2022-02-03 NOTE — Telephone Encounter (Signed)
She is having a CT for the lung lesion. Moving up the ultrasound won't help with that.  ?

## 2022-02-03 NOTE — Telephone Encounter (Signed)
Patient is concerned because she is having to wait until April 17th for the ultrasund for her thyroid nodule AND the CT scan for her lung nodule.  She does not feel she should wait until then.  Please advise. ?

## 2022-02-03 NOTE — Telephone Encounter (Signed)
See if referral can arrange for sooner appt. ?

## 2022-02-24 ENCOUNTER — Ambulatory Visit (INDEPENDENT_AMBULATORY_CARE_PROVIDER_SITE_OTHER): Payer: Medicare HMO | Admitting: Family Medicine

## 2022-02-24 DIAGNOSIS — E538 Deficiency of other specified B group vitamins: Secondary | ICD-10-CM | POA: Diagnosis not present

## 2022-03-03 ENCOUNTER — Ambulatory Visit (HOSPITAL_COMMUNITY)
Admission: RE | Admit: 2022-03-03 | Discharge: 2022-03-03 | Disposition: A | Discharge status: Home / Self Care | Payer: Medicare HMO | Source: Ambulatory Visit | Attending: Family Medicine | Admitting: Family Medicine

## 2022-03-03 DIAGNOSIS — R918 Other nonspecific abnormal finding of lung field: Secondary | ICD-10-CM

## 2022-03-03 DIAGNOSIS — E041 Nontoxic single thyroid nodule: Secondary | ICD-10-CM | POA: Diagnosis not present

## 2022-03-04 ENCOUNTER — Telehealth: Payer: Self-pay | Admitting: Family Medicine

## 2022-03-04 ENCOUNTER — Other Ambulatory Visit: Payer: Self-pay | Admitting: Family Medicine

## 2022-03-04 DIAGNOSIS — E041 Nontoxic single thyroid nodule: Secondary | ICD-10-CM

## 2022-03-04 NOTE — Telephone Encounter (Signed)
The CT has not been read yet by radiology. The ultrasound is inconclusive. As a result a needle biopsy of the thyroid will be needed.  ?

## 2022-03-04 NOTE — Telephone Encounter (Signed)
Patient aware.

## 2022-03-04 NOTE — Progress Notes (Signed)
Pt. Was notified of results and plan of action.  Fine-needle aspiration biopsy ordered ?WS

## 2022-03-04 NOTE — Telephone Encounter (Signed)
She should get a call within a week. For biopsy. ?

## 2022-03-04 NOTE — Telephone Encounter (Signed)
Spoke to patient. I informed  that CT scan has not been officially read by radiology. I assured we would call as soon as we get the results. ? ?I informed that the U/S was inconclusive and that Dr. Livia Snellen recommends needle biopsy. ? ?Patient asks how does that get done. She asks if someone will be calling to schedule that?  ?

## 2022-03-05 ENCOUNTER — Other Ambulatory Visit: Payer: Self-pay | Admitting: Family Medicine

## 2022-03-05 ENCOUNTER — Telehealth: Payer: Self-pay | Admitting: Family Medicine

## 2022-03-05 DIAGNOSIS — R911 Solitary pulmonary nodule: Secondary | ICD-10-CM

## 2022-03-05 NOTE — Telephone Encounter (Signed)
Patient is upset because no one has called her about her lung scan. She stated that this was completed on 4/17 and it was urgent that she know the results. Please call back.  ?

## 2022-03-05 NOTE — Progress Notes (Signed)
Pt. Was notified of results and plan of action. REferral to pulmonology made ?WS

## 2022-03-05 NOTE — Telephone Encounter (Signed)
I spoke to her. She understands the report and is willing to see Pulmonary. I wrote a referral. ?

## 2022-03-13 ENCOUNTER — Encounter (HOSPITAL_COMMUNITY): Payer: Self-pay

## 2022-03-13 ENCOUNTER — Ambulatory Visit (HOSPITAL_COMMUNITY)
Admission: RE | Admit: 2022-03-13 | Discharge: 2022-03-13 | Disposition: A | Discharge status: Home / Self Care | Payer: Medicare HMO | Source: Ambulatory Visit | Attending: Family Medicine | Admitting: Family Medicine

## 2022-03-13 DIAGNOSIS — E041 Nontoxic single thyroid nodule: Secondary | ICD-10-CM | POA: Diagnosis not present

## 2022-03-13 MED ORDER — LIDOCAINE HCL (PF) 2 % IJ SOLN
10.0000 mL | Freq: Once | INTRAMUSCULAR | Status: AC
Start: 2022-03-13 — End: 2022-03-13
  Administered 2022-03-13: 10 mL

## 2022-03-13 NOTE — Progress Notes (Signed)
PT tolerated thyroid biopsy procedure well today. Labs and afirma obtained and sent for pathology. PT ambulatory at discharge with daughter with no acute distress noted and verbalized understanding of discharge instructions.  ?

## 2022-03-14 LAB — CYTOLOGY - NON PAP

## 2022-03-17 ENCOUNTER — Telehealth: Payer: Self-pay | Admitting: Family Medicine

## 2022-03-17 ENCOUNTER — Other Ambulatory Visit: Payer: Self-pay | Admitting: Family Medicine

## 2022-03-17 ENCOUNTER — Encounter: Payer: Self-pay | Admitting: Family Medicine

## 2022-03-17 DIAGNOSIS — C73 Malignant neoplasm of thyroid gland: Secondary | ICD-10-CM | POA: Insufficient documentation

## 2022-03-17 NOTE — Progress Notes (Signed)
Pt. Was notified of results and plan of action.  Referral to ENT placed. ?WS

## 2022-03-17 NOTE — Telephone Encounter (Signed)
R/C to Dr. Livia Snellen  ?

## 2022-03-26 ENCOUNTER — Encounter: Payer: Medicare Other | Admitting: *Deleted

## 2022-03-26 ENCOUNTER — Ambulatory Visit: Payer: Medicare HMO | Admitting: Pulmonary Disease

## 2022-03-26 ENCOUNTER — Encounter: Payer: Self-pay | Admitting: Pulmonary Disease

## 2022-03-26 VITALS — BP 112/74 | HR 85 | Temp 98.6°F | Ht 62.0 in | Wt 199.4 lb

## 2022-03-26 DIAGNOSIS — R918 Other nonspecific abnormal finding of lung field: Secondary | ICD-10-CM

## 2022-03-26 DIAGNOSIS — Z720 Tobacco use: Secondary | ICD-10-CM | POA: Diagnosis not present

## 2022-03-26 DIAGNOSIS — Z006 Encounter for examination for normal comparison and control in clinical research program: Secondary | ICD-10-CM

## 2022-03-26 DIAGNOSIS — R911 Solitary pulmonary nodule: Secondary | ICD-10-CM

## 2022-03-26 NOTE — H&P (View-Only) (Signed)
Synopsis: Referred in May 2023 for a lung nodule by Claretta Fraise, MD  Subjective:   PATIENT ID: Renee Maldonado GENDER: female DOB: 09/09/1946, MRN: [id number]  Chief Complaint  Patient presents with   Consult    Patient is here to talk about nodules in right lung.     This is a 76 year old female, past medical history of right breast cancer with radiation, COPD, on Breo, history of hypertension, hyperlipidemia obesity.  Patient longstanding history of tobacco use since teenager.  She still smokes just a few cigarettes per day.  Here with a repeat CT scan of the chest that shows right upper lobe nodule that has been persistent.  She recently fell at home had a CT scan of the head and neck which called the right upper lobe that showed a small nodule.  On this repeat CT also showed a 14 mm subsolid groundglass lesion in the right chest concerning for a malignancy.   Past Medical History:  Diagnosis Date   Anxiety    Arthritis    Cancer ([place])    breast Right no chemo or radiation  on Arimidex   COPD (chronic obstructive pulmonary disease) (HCC)    on Breo   GERD (gastroesophageal reflux disease)    Humerus fracture 2007   Hyperlipidemia    Hypertension    Insomnia    OA (osteoarthritis) of knee 10/08/2020     Family History  Problem Relation Age of Onset   Alzheimer's disease Mother    Diabetes Mother    Stroke Father    Hypertension Father    Cancer Brother      Past Surgical History:  Procedure Laterality Date   BREAST LUMPECTOMY Right 02/2019   BREAST SURGERY Right 01/2019   breast biopsy   JOINT REPLACEMENT     left knee   Left shoulder surgery     2007   TOTAL KNEE ARTHROPLASTY     2012 left   TOTAL KNEE ARTHROPLASTY Right 10/08/2020   Procedure: TOTAL KNEE ARTHROPLASTY;  Surgeon: Gaynelle Arabian, MD;  Location: [facility];  Service: Orthopedics;  Laterality: Right;   TUBAL LIGATION      Social History   Socioeconomic History   Marital status: Married     Spouse name: Jori Moll   Number of children: 3   Years of education: 16   Highest education level: Bachelor's degree (e.g., BA, AB, BS)  Occupational History   Occupation: retired  Tobacco Use   Smoking status: Former    Packs/day: 0.25    Years: 40.00    Pack years: 10.00    Types: Cigarettes    Quit date: 09/17/2014    Years since quitting: 7.5   Smokeless tobacco: Never   Tobacco comments:    smokes 3 a day  Vaping Use   Vaping Use: Former   Start date: 07/18/2018   Quit date: 07/19/2019  Substance and Sexual Activity   Alcohol use: Yes    Alcohol/week: 0.0 standard drinks    Comment: rarely-     Drug use: No   Sexual activity: Not Currently  Other Topics Concern   Not on file  Social History Narrative   Lives home with her husband. Enjoys travelling, playing bingo, exercising at [facility]creation center, eats out a lot   Social Determinants of Radio broadcast assistant Strain: Low Risk    Difficulty of Paying Living Expenses: Not hard at all  Food Insecurity: No Food Insecurity   Worried About Running Out  of Food in the Last Year: Never true   [place] in the Last Year: Never true  Transportation Needs: No Transportation Needs   Lack of Transportation (Medical): No   Lack of Transportation (Non-Medical): No  Physical Activity: Insufficiently Active   Days of Exercise per Week: 4 days   Minutes of Exercise per Session: 30 min  Stress: No Stress Concern Present   Feeling of Stress : Not at all  Social Connections: Socially Integrated   Frequency of Communication with Friends and Family: More than three times a week   Frequency of Social Gatherings with Friends and Family: More than three times a week   Attends Religious Services: More than 4 times per year   Active Member of [employer] or Organizations: Yes   Attends Music therapist: More than 4 times per year   Marital Status: Married  Human resources officer Violence: Not At Risk   Fear of Current or Ex-Partner:  No   Emotionally Abused: No   Physically Abused: No   Sexually Abused: No     Allergies  Allergen Reactions   Benadryl [Diphenhydramine Hcl]    Penicillins     Had a rash around 20 years ago but unsure if it come from medicine.   Tolerated Cephalosporin Date: 10/09/20.       Outpatient Medications Prior to Visit  Medication Sig Dispense Refill   acetaminophen (TYLENOL) 500 MG tablet Take 500 mg by mouth every 6 (six) hours as needed for moderate pain.      albuterol (VENTOLIN HFA) 108 (90 Base) MCG/ACT inhaler 2 puffs in lungs every 6 hours as needed for wheezing/short of breath. 8.5 g 5   amLODipine (NORVASC) 5 MG tablet TAKE 1 TABLET DAILY 90 tablet 1   anastrozole (ARIMIDEX) 1 MG tablet Take 1 mg by mouth daily.      budesonide-formoterol (SYMBICORT) 160-4.5 MCG/ACT inhaler Inhale 2 puffs into the lungs 2 (two) times daily. 3 each 3   chlorpheniramine (CHLOR-TRIMETON) 4 MG tablet Take 4 mg by mouth in the morning and at bedtime.      clindamycin (CLEOCIN) 300 MG capsule Take 300 mg by mouth See admin instructions. Take 600 mg by mouth one hour prior to dental appointment.     cyanocobalamin (,VITAMIN B-12,) 1000 MCG/ML injection Inject 1 mL (1,000 mcg total) into the skin every 30 (thirty) days. 1 mL 11   famotidine (PEPCID) 40 MG tablet Take 1 tablet (40 mg total) by mouth daily. 90 tablet 2   hydrochlorothiazide (HYDRODIURIL) 25 MG tablet Take 1 tablet (25 mg total) by mouth daily. 90 tablet 3   hydrOXYzine (ATARAX/VISTARIL) 25 MG tablet hydroxyzine HCl 25 mg tablet     raloxifene (EVISTA) 60 MG tablet Take 1 tablet (60 mg total) by mouth daily. For bone health 90 tablet 3   RESTASIS 0.05 % ophthalmic emulsion Place 1 drop into both eyes 2 (two) times daily as needed (dry eyes).      rosuvastatin (CRESTOR) 20 MG tablet TAKE 1 TABLET DAILY 90 tablet 0   traZODone (DESYREL) 100 MG tablet Take 1 tablet (100 mg total) by mouth at bedtime. 90 tablet 3   valACYclovir (VALTREX) 500 MG  tablet TAKE (1) TABLET DAILY AS NEEDED. 30 tablet 2   Facility-Administered Medications Prior to Visit  Medication Dose Route Frequency Provider Last Rate Last Admin   cyanocobalamin ((VITAMIN B-12)) injection 1,000 mcg  1,000 mcg Intramuscular Q30 days Claretta Fraise, MD   1,000 mcg  at 02/24/22 1003    Review of Systems  Constitutional:  Negative for chills, fever, malaise/fatigue and weight loss.  HENT:  Negative for hearing loss, sore throat and tinnitus.   Eyes:  Negative for blurred vision and double vision.  Respiratory:  Negative for cough, hemoptysis, sputum production, shortness of breath, wheezing and stridor.   Cardiovascular:  Negative for chest pain, palpitations, orthopnea, leg swelling and PND.  Gastrointestinal:  Negative for abdominal pain, constipation, diarrhea, heartburn, nausea and vomiting.  Genitourinary:  Negative for dysuria, hematuria and urgency.  Musculoskeletal:  Negative for joint pain and myalgias.  Skin:  Negative for itching and rash.  Neurological:  Negative for dizziness, tingling, weakness and headaches.  Endo/Heme/Allergies:  Negative for environmental allergies. Does not bruise/bleed easily.  Psychiatric/Behavioral:  Negative for depression. The patient is nervous/anxious. The patient does not have insomnia.   All other systems reviewed and are negative.   Objective:  Physical Exam Vitals reviewed.  Constitutional:      General: She is not in acute distress.    Appearance: She is well-developed. She is obese.  HENT:     Head: Normocephalic and atraumatic.  Eyes:     General: No scleral icterus.    Conjunctiva/sclera: Conjunctivae normal.     Pupils: Pupils are equal, round, and reactive to light.  Neck:     Vascular: No JVD.     Trachea: No tracheal deviation.  Cardiovascular:     Rate and Rhythm: Normal rate and regular rhythm.     Heart sounds: Normal heart sounds. No murmur heard. Pulmonary:     Effort: Pulmonary effort is normal. No  tachypnea, accessory muscle usage or respiratory distress.     Breath sounds: No stridor. No wheezing, rhonchi or rales.  Abdominal:     General: There is no distension.     Palpations: Abdomen is soft.     Tenderness: There is no abdominal tenderness.  Musculoskeletal:        General: No tenderness.     Cervical back: Neck supple.  Lymphadenopathy:     Cervical: No cervical adenopathy.  Skin:    General: Skin is warm and dry.     Capillary Refill: Capillary refill takes less than 2 seconds.     Findings: No rash.  Neurological:     Mental Status: She is alert and oriented to person, place, and time.  Psychiatric:        Behavior: Behavior normal.     Vitals:   03/26/22 1039  BP: 112/74  Pulse: 85  Temp: 98.6 F (37 C)  TempSrc: Oral  SpO2: 97%  Weight: 199 lb 6.4 oz (90.4 kg)  Height: 5\' 2"  (1.575 m)   97% on RA BMI Readings from Last 3 Encounters:  03/26/22 36.47 kg/m  01/13/22 35.81 kg/m  01/03/22 36.57 kg/m   Wt Readings from Last 3 Encounters:  03/26/22 199 lb 6.4 oz (90.4 kg)  01/13/22 195 lb 12.8 oz (88.8 kg)  01/03/22 199 lb 15.3 oz (90.7 kg)     CBC    Component Value Date/Time   WBC 4.4 11/25/2021 0912   WBC 12.4 (H) 10/09/2020 0338   RBC 4.40 11/25/2021 0912   RBC 3.56 (L) 10/09/2020 0338   HGB 12.3 11/25/2021 0912   HCT 36.7 11/25/2021 0912   PLT 245 11/25/2021 0912   MCV 83 11/25/2021 0912   MCH 28.0 11/25/2021 0912   MCH 27.8 10/09/2020 0338   MCHC 33.5 11/25/2021 0912   MCHC  32.9 10/09/2020 0338   RDW 13.4 11/25/2021 0912   LYMPHSABS 1.1 11/25/2021 0912   EOSABS 0.2 11/25/2021 0912   BASOSABS 0.0 11/25/2021 0912     Chest Imaging: CT scan of the chest April 2023: Groundglass subsolid lesion in the right side concerning for malignancy also small groundglass within the right lower lobe as well as a right upper lobe 10 mm subsolid round lesion. This could represent multifocal indolent neoplasm. The patient's images have been  independently reviewed by me.    Pulmonary Functions Testing Results:     View : No data to display.          FeNO:   Pathology:   Echocardiogram:   Heart Catheterization:     Assessment & Plan:     ICD-10-CM   1. Multiple lung nodules  R91.8 Procedural/ Surgical Case Request: ROBOTIC ASSISTED NAVIGATIONAL BRONCHOSCOPY    Ambulatory referral to Pulmonology    CANCELED: Ambulatory referral to Pulmonology    2. Ground glass opacity present on imaging of lung  R91.8 Procedural/ Surgical Case Request: ROBOTIC ASSISTED NAVIGATIONAL BRONCHOSCOPY    Ambulatory referral to Pulmonology    CANCELED: Ambulatory referral to Pulmonology    3. Tobacco use  Z72.0       Discussion: This is a 76 year old female, longstanding history of tobacco use now with a CT scan of the chest with multifocal lesions in the right side concerning for indolent neoplasm.  The most concerning 1 is in the right central chest with a subsolid component.  She also has a pure groundglass lesion within the right lower lobe and also a smaller nodule within the right upper.  Plan: I think with a constellation of findings and a longstanding history of smoking we are likely dealing with a indolent neoplasm at least in one location.  I explained to her the risk benefits and alternatives of proceeding with bronchoscopy and tissue sampling. I do not think a PET scan is very useful with the type of lesions that she has. If we do confirm malignancy she will need a PET to complete staging.  But it does not really change on development of probability for malignancy and subsolid and groundglass lesions like she has. Therefore I think we can move forward confidently with offering tissue sampling due to her hesitancy and concern about having malignancy in the chest. We talked about the risk of bleeding and pneumothorax. Patient is agreeable to proceed.  Tentative bronchoscopy date will be on 04/15/2022. Patient to follow-up  with [place] in 1 week after.  We appreciate P[facility]'s help with scheduling.    Current Outpatient Medications:    acetaminophen (TYLENOL) 500 MG tablet, Take 500 mg by mouth every 6 (six) hours as needed for moderate pain. , Disp: , Rfl:    albuterol (VENTOLIN HFA) 108 (90 Base) MCG/ACT inhaler, 2 puffs in lungs every 6 hours as needed for wheezing/short of breath., Disp: 8.5 g, Rfl: 5   amLODipine (NORVASC) 5 MG tablet, TAKE 1 TABLET DAILY, Disp: 90 tablet, Rfl: 1   anastrozole (ARIMIDEX) 1 MG tablet, Take 1 mg by mouth daily. , Disp: , Rfl:    budesonide-formoterol (SYMBICORT) 160-4.5 MCG/ACT inhaler, Inhale 2 puffs into the lungs 2 (two) times daily., Disp: 3 each, Rfl: 3   chlorpheniramine (CHLOR-TRIMETON) 4 MG tablet, Take 4 mg by mouth in the morning and at bedtime. , Disp: , Rfl:    clindamycin (CLEOCIN) 300 MG capsule, Take 300 mg by mouth See admin instructions. Take  600 mg by mouth one hour prior to dental appointment., Disp: , Rfl:    cyanocobalamin (,VITAMIN B-12,) 1000 MCG/ML injection, Inject 1 mL (1,000 mcg total) into the skin every 30 (thirty) days., Disp: 1 mL, Rfl: 11   famotidine (PEPCID) 40 MG tablet, Take 1 tablet (40 mg total) by mouth daily., Disp: 90 tablet, Rfl: 2   hydrochlorothiazide (HYDRODIURIL) 25 MG tablet, Take 1 tablet (25 mg total) by mouth daily., Disp: 90 tablet, Rfl: 3   hydrOXYzine (ATARAX/VISTARIL) 25 MG tablet, hydroxyzine HCl 25 mg tablet, Disp: , Rfl:    raloxifene (EVISTA) 60 MG tablet, Take 1 tablet (60 mg total) by mouth daily. For bone health, Disp: 90 tablet, Rfl: 3   RESTASIS 0.05 % ophthalmic emulsion, Place 1 drop into both eyes 2 (two) times daily as needed (dry eyes). , Disp: , Rfl:    rosuvastatin (CRESTOR) 20 MG tablet, TAKE 1 TABLET DAILY, Disp: 90 tablet, Rfl: 0   traZODone (DESYREL) 100 MG tablet, Take 1 tablet (100 mg total) by mouth at bedtime., Disp: 90 tablet, Rfl: 3   valACYclovir (VALTREX) 500 MG tablet, TAKE (1) TABLET DAILY AS NEEDED.,  Disp: 30 tablet, Rfl: 2  Current Facility-Administered Medications:    cyanocobalamin ((VITAMIN B-12)) injection 1,000 mcg, 1,000 mcg, Intramuscular, Q30 days, Claretta Fraise, MD, 1,000 mcg at 02/24/22 1003  I spent 63 minutes dedicated to the care of this patient on the date of this encounter to include pre-visit review of records, face-to-face time with the patient discussing conditions above, post visit ordering of testing, clinical documentation with the electronic health record, making appropriate referrals as documented, and communicating necessary findings to members of the patients care team.   Garner Nash, DO [facility] 03/26/2022 11:56 AM

## 2022-03-26 NOTE — Patient Instructions (Signed)
Thank you for visiting Dr. Valeta Harms at [facility]. ?Today we recommend the following: ? ?Orders Placed This Encounter  ?Procedures  ? Procedural/ Surgical Case Request: ROBOTIC ASSISTED NAVIGATIONAL BRONCHOSCOPY  ? Ambulatory referral to Pulmonology  ? ?Bronchoscopy 04/15/2022 ? ?Return in about 27 days (around 04/22/2022) for with Eric Form, NP. ? ? ? ?Please do your part to reduce the spread of COVID-19.  ? ?

## 2022-03-26 NOTE — Research (Signed)
Title: NIGHTINGALE: CliNIcal Utility of ManaGement of Patients witH CT and LDCT Identified Pulmonary Nodules UsinG the Percepta NasAL Swab ClassifiEr -- with Familiarization ?  ?Protocol #: [id number] Sponsor: [employer] ?  ?Protocol Revision 1 dated 01Sep2022 and confirmed current on today's visit, IRB approved Revision 1 on 29Dec2022. ?  ?Objectives:  ?Primary: To evaluate if use of the Percepta Nasal Swab test in the diagnostic work up ?of newly identified pulmonary nodules reduces the number of invasive procedures in the ?group classified as low-risk by the test and that are benign as compared to a control ?group managed without a Percepta Nasal Swab test result. ?                  A newly identified nodule is defined as any nodule first identified on imaging ?                  <90 days prior to nasal sample collection that hasn?t undergone a diagnostic ?                   procedure for the management of their index nodule prior to enrollment. ?                  CT imaging includes conventional CT, LDCT, HRCT ?                  Benign diagnosis is defined as a specific diagnosis of a benign condition, ?                   radiographic resolution or stability at ? 24 months, or no cytological, ?                   radiological, or pathological evidence of cancer. ?                  Procedures will be categorized as either invasive or non-invasive in the Data ?                   Management Plan (DMP). ?Secondary: To evaluate if use of the Percepta Nasal Swab test in the diagnostic work ?up of newly identified pulmonary nodules increases the proportion of subjects classified ?as high-risk by the test and have primary lung cancer that go directly to appropriate ?therapy as compared to a control group managed without a Percepta Nasal Swab test ?result. ?                   Proportion of subjects that go directly to appropriate therapy is defined as those ?                    subjects that undergo surgery, ablative  or other appropriate therapy as the next ?                    step after the Percepta Nasal Swab test result without intervening non-surgical ?                    procedures ?                            a. Non-surgical procedures include diagnostic PET, but not PET for ?  staging purposes. ?                            b. Appropriate therapies will be defined in the CRF. ?                   A newly identified nodule is defined as any nodule first identified on imaging ?                   <90 days prior to nasal sample collection. ?                   Lung cancer diagnosis is defined as established by cytology or pathology, or in ?                    circumstances where a presumptive diagnosis of cancer led to definitive ?                    ablative or other appropriate therapy without pathology. ?Key Inclusion Criteria: ? Inclusion Criteria: ? Able to tolerate nasal epithelial specimen collection ? Signed written Informed Consent obtained ? Renee clinical history available for review by sponsor and regulatory agencies ? New nodule first identified on imaging < 90 days prior to nasal sample collection ?(index nodule) ? CT report available for index nodule ? 65 - 77 years of age ? Current or former smoker (>100 cigarettes in a lifetime) ? Pulmonary nodule ?30 mm detected by CT ? ?Key Exclusion Criteria: ?Exclusion Criteria ? Renee has undergone a diagnostic procedure for the management of their ?index nodule after the index CT and prior to enrollment ? Active cancer (other than non-melanoma skin cancer) ? Prior primary lung cancer (prior non-lung cancer acceptable) ? Prior participation in this study (i.e., subjects may not be enrolled more than ?once) ? Current active treatment with an investigational device or drug (patients in trial ?follow up period are okay if intervention phase is complete) ? Patient enrolled or planned to be enrolled in another clinical trial that may ?influence  management of the patient?s nodule ? Concurrent or planned use of tools or tests for assigning lung nodule risk of ?malignancy (e.g., genomic or proteomic blood tests) other than clinically ?validated risk calculators ? ?Clinical Research Coordinator / Research RN note : This visit is for enrollment / baseline  Renee Maldonado with DOB: [id number] on [id number] for the above protocol is an Enrollment Visit and is for purpose of research.  ?  ?Renee expressed interest and consent in continuing as a study Renee. Renee confirmed contact information (e.g. address, telephone, email). Renee thanked for participation in research and contribution to science.   ?  ?During this visit on [id number]  , the Renee reviewed and signed the consent form, provided demographics, and had a nasal swab collected per the above referenced protocol. Please refer to the Renee's paper source binder for further details.  ? ?PI, Dr. Valeta Harms, was present for consenting process. ?  ?Signed by ?Jaye Beagle RN, CRN II  ?

## 2022-03-26 NOTE — Progress Notes (Signed)
? ?Synopsis: Referred in May 2023 for a lung nodule by Claretta Fraise, MD ? ?Subjective:  ? ?PATIENT ID: Renee Maldonado GENDER: female DOB: Jun 03, 1946, MRN: [id number] ? ?Chief Complaint  ?Patient presents with  ? Consult  ?  Patient is here to talk about nodules in right lung.   ? ? ?This is a 76 year old female, past medical history of right breast cancer with radiation, COPD, on Breo, history of hypertension, hyperlipidemia obesity.  Patient longstanding history of tobacco use since teenager.  She still smokes just a few cigarettes per day.  Here with a repeat CT scan of the chest that shows right upper lobe nodule that has been persistent.  She recently fell at home had a CT scan of the head and neck which called the right upper lobe that showed a small nodule.  On this repeat CT also showed a 14 mm subsolid groundglass lesion in the right chest concerning for a malignancy. ? ? ?Past Medical History:  ?Diagnosis Date  ? Anxiety   ? Arthritis   ? Cancer [facility])   ? breast Right no chemo or radiation  on Arimidex  ? COPD (chronic obstructive pulmonary disease) ([place])   ? on Breo  ? GERD (gastroesophageal reflux disease)   ? Humerus fracture 2007  ? Hyperlipidemia   ? Hypertension   ? Insomnia   ? OA (osteoarthritis) of knee 10/08/2020  ?  ? ?Family History  ?Problem Relation Age of Onset  ? Alzheimer's disease Mother   ? Diabetes Mother   ? Stroke Father   ? Hypertension Father   ? Cancer Brother   ?  ? ?Past Surgical History:  ?Procedure Laterality Date  ? BREAST LUMPECTOMY Right 02/2019  ? BREAST SURGERY Right 01/2019  ? breast biopsy  ? JOINT REPLACEMENT    ? left knee  ? Left shoulder surgery    ? 2007  ? TOTAL KNEE ARTHROPLASTY    ? 2012 left  ? TOTAL KNEE ARTHROPLASTY Right 10/08/2020  ? Procedure: TOTAL KNEE ARTHROPLASTY;  Surgeon: Gaynelle Arabian, MD;  Location: [facility];  Service: Orthopedics;  Laterality: Right;  ? TUBAL LIGATION    ? ? ?Social History  ? ?Socioeconomic History  ? Marital status: Married  ?   Spouse name: Jori Moll  ? Number of children: 3  ? Years of education: 75  ? Highest education level: Bachelor's degree (e.g., BA, AB, BS)  ?Occupational History  ? Occupation: retired  ?Tobacco Use  ? Smoking status: Former  ?  Packs/day: 0.25  ?  Years: 40.00  ?  Pack years: 10.00  ?  Types: Cigarettes  ?  Quit date: 09/17/2014  ?  Years since quitting: 7.5  ? Smokeless tobacco: Never  ? Tobacco comments:  ?  smokes 3 a day  ?Vaping Use  ? Vaping Use: Former  ? Start date: 07/18/2018  ? Quit date: 07/19/2019  ?Substance and Sexual Activity  ? Alcohol use: Yes  ?  Alcohol/week: 0.0 standard drinks  ?  Comment: rarely-    ? Drug use: No  ? Sexual activity: Not Currently  ?Other Topics Concern  ? Not on file  ?Social History Narrative  ? Lives home with her husband. Enjoys travelling, playing bingo, exercising at [facility]creation center, eats out a lot  ? ?Social Determinants of Health  ? ?Financial Resource Strain: Low Risk   ? Difficulty of Paying Living Expenses: Not hard at all  ?Food Insecurity: No Food Insecurity  ? Worried About Estate manager/land agent  of Food in the Last Year: Never true  ? Ran Out of Food in the Last Year: Never true  ?Transportation Needs: No Transportation Needs  ? Lack of Transportation (Medical): No  ? Lack of Transportation (Non-Medical): No  ?Physical Activity: Insufficiently Active  ? Days of Exercise per Week: 4 days  ? Minutes of Exercise per Session: 30 min  ?Stress: No Stress Concern Present  ? Feeling of Stress : Not at all  ?Social Connections: Socially Integrated  ? Frequency of Communication with Friends and Family: More than three times a week  ? Frequency of Social Gatherings with Friends and Family: More than three times a week  ? Attends Religious Services: More than 4 times per year  ? Active Member of Clubs or Organizations: Yes  ? Attends Archivist Meetings: More than 4 times per year  ? Marital Status: Married  ?Intimate Partner Violence: Not At Risk  ? Fear of Current or Ex-Partner:  No  ? Emotionally Abused: No  ? Physically Abused: No  ? Sexually Abused: No  ?  ? ?Allergies  ?Allergen Reactions  ? Benadryl [Diphenhydramine Hcl]   ? Penicillins   ?  Had a rash around 20 years ago but unsure if it come from medicine.  ? ?Tolerated Cephalosporin Date: 10/09/20. ? ?  ?  ? ?Outpatient Medications Prior to Visit  ?Medication Sig Dispense Refill  ? acetaminophen (TYLENOL) 500 MG tablet Take 500 mg by mouth every 6 (six) hours as needed for moderate pain.     ? albuterol (VENTOLIN HFA) 108 (90 Base) MCG/ACT inhaler 2 puffs in lungs every 6 hours as needed for wheezing/short of breath. 8.5 g 5  ? amLODipine (NORVASC) 5 MG tablet TAKE 1 TABLET DAILY 90 tablet 1  ? anastrozole (ARIMIDEX) 1 MG tablet Take 1 mg by mouth daily.     ? budesonide-formoterol (SYMBICORT) 160-4.5 MCG/ACT inhaler Inhale 2 puffs into the lungs 2 (two) times daily. 3 each 3  ? chlorpheniramine (CHLOR-TRIMETON) 4 MG tablet Take 4 mg by mouth in the morning and at bedtime.     ? clindamycin (CLEOCIN) 300 MG capsule Take 300 mg by mouth See admin instructions. Take 600 mg by mouth one hour prior to dental appointment.    ? cyanocobalamin (,VITAMIN B-12,) 1000 MCG/ML injection Inject 1 mL (1,000 mcg total) into the skin every 30 (thirty) days. 1 mL 11  ? famotidine (PEPCID) 40 MG tablet Take 1 tablet (40 mg total) by mouth daily. 90 tablet 2  ? hydrochlorothiazide (HYDRODIURIL) 25 MG tablet Take 1 tablet (25 mg total) by mouth daily. 90 tablet 3  ? hydrOXYzine (ATARAX/VISTARIL) 25 MG tablet hydroxyzine HCl 25 mg tablet    ? raloxifene (EVISTA) 60 MG tablet Take 1 tablet (60 mg total) by mouth daily. For bone health 90 tablet 3  ? RESTASIS 0.05 % ophthalmic emulsion Place 1 drop into both eyes 2 (two) times daily as needed (dry eyes).     ? rosuvastatin (CRESTOR) 20 MG tablet TAKE 1 TABLET DAILY 90 tablet 0  ? traZODone (DESYREL) 100 MG tablet Take 1 tablet (100 mg total) by mouth at bedtime. 90 tablet 3  ? valACYclovir (VALTREX) 500 MG  tablet TAKE (1) TABLET DAILY AS NEEDED. 30 tablet 2  ? ?Facility-Administered Medications Prior to Visit  ?Medication Dose Route Frequency Provider Last Rate Last Admin  ? cyanocobalamin ((VITAMIN B-12)) injection 1,000 mcg  1,000 mcg Intramuscular Q30 days Claretta Fraise, MD   1,000 mcg  at 02/24/22 1003  ? ? ?Review of Systems  ?Constitutional:  Negative for chills, fever, malaise/fatigue and weight loss.  ?HENT:  Negative for hearing loss, sore throat and tinnitus.   ?Eyes:  Negative for blurred vision and double vision.  ?Respiratory:  Negative for cough, hemoptysis, sputum production, shortness of breath, wheezing and stridor.   ?Cardiovascular:  Negative for chest pain, palpitations, orthopnea, leg swelling and PND.  ?Gastrointestinal:  Negative for abdominal pain, constipation, diarrhea, heartburn, nausea and vomiting.  ?Genitourinary:  Negative for dysuria, hematuria and urgency.  ?Musculoskeletal:  Negative for joint pain and myalgias.  ?Skin:  Negative for itching and rash.  ?Neurological:  Negative for dizziness, tingling, weakness and headaches.  ?Endo/Heme/Allergies:  Negative for environmental allergies. Does not bruise/bleed easily.  ?Psychiatric/Behavioral:  Negative for depression. The patient is nervous/anxious. The patient does not have insomnia.   ?All other systems reviewed and are negative. ? ? ?Objective:  ?Physical Exam ?Vitals reviewed.  ?Constitutional:   ?   General: She is not in acute distress. ?   Appearance: She is well-developed. She is obese.  ?HENT:  ?   Head: Normocephalic and atraumatic.  ?Eyes:  ?   General: No scleral icterus. ?   Conjunctiva/sclera: Conjunctivae normal.  ?   Pupils: Pupils are equal, round, and reactive to light.  ?Neck:  ?   Vascular: No JVD.  ?   Trachea: No tracheal deviation.  ?Cardiovascular:  ?   Rate and Rhythm: Normal rate and regular rhythm.  ?   Heart sounds: Normal heart sounds. No murmur heard. ?Pulmonary:  ?   Effort: Pulmonary effort is normal. No  tachypnea, accessory muscle usage or respiratory distress.  ?   Breath sounds: No stridor. No wheezing, rhonchi or rales.  ?Abdominal:  ?   General: There is no distension.  ?   Palpations: Abdomen is s

## 2022-03-27 ENCOUNTER — Ambulatory Visit (INDEPENDENT_AMBULATORY_CARE_PROVIDER_SITE_OTHER): Payer: Medicare HMO

## 2022-03-27 DIAGNOSIS — E538 Deficiency of other specified B group vitamins: Secondary | ICD-10-CM

## 2022-04-01 DIAGNOSIS — C73 Malignant neoplasm of thyroid gland: Secondary | ICD-10-CM | POA: Diagnosis not present

## 2022-04-11 ENCOUNTER — Encounter (HOSPITAL_COMMUNITY): Payer: Self-pay | Admitting: Pulmonary Disease

## 2022-04-11 ENCOUNTER — Other Ambulatory Visit: Payer: Self-pay

## 2022-04-11 ENCOUNTER — Other Ambulatory Visit: Payer: Self-pay | Admitting: Family Medicine

## 2022-04-11 ENCOUNTER — Other Ambulatory Visit: Payer: Self-pay | Admitting: Pulmonary Disease

## 2022-04-11 LAB — SARS CORONAVIRUS 2 (TAT 6-24 HRS): SARS Coronavirus 2: NEGATIVE

## 2022-04-11 NOTE — Progress Notes (Signed)
PCP - Claretta Fraise, MD Cardiologist - denies  PPM/ICD - denies  EKG - 04/15/2022 Stress Test - denies ECHO - denies Cardiac Cath - denies  CPAP - n/a  Fasting Blood Sugar - n/a  Blood Thinner Instructions: n/a  Aspirin Instructions: Patient was instructed: As of today, STOP taking any Aspirin (unless otherwise instructed by your surgeon) Aleve, Naproxen, Ibuprofen, Motrin, Advil, Goody's, BC's, all herbal medications, fish oil, and all vitamins.  ERAS Protcol - n/a  COVID TEST- patient is scheduled for test today, 04/11/22 at 11:00 o'clock  Anesthesia review: no  Patient verbally denies any shortness of breath, fever, cough and chest pain during phone call   -------------  SDW INSTRUCTIONS given:  Your procedure is scheduled on Tuesday, May 30th, 2023.  Report to [facility] Main Entrance "A" at 08:30 A.M., and check in at the Admitting office.  Call this number if you have problems the morning of surgery:  [phone]   Remember:  Do not eat or drink after midnight the night before your surgery    Take these medicines the morning of surgery with A SIP OF WATER Amlodipine, Anastrozole, Symbicort, Chlorpheniramine, Pepcid, Raloxifene, Crestor; PRN: Tylenol, Albuterol, Hydroxyzine, Vartrex.      The day of surgery:                  Do not wear jewelry, make up, or nail polish            Do not wear lotions, powders, perfume, or deodorant.            Do not shave 48 hours prior to surgery.              Do not bring valuables to the hospital.            [facility] is not responsible for any belongings or valuables.  Do NOT Smoke (Tobacco/Vaping) 24 hours prior to your procedure If you use a CPAP at night, you may bring all equipment for your overnight stay.   Contacts, glasses, dentures or bridgework may not be worn into surgery.      For patients admitted to the hospital, discharge time will be determined by your treatment team.   Patients discharged the day of  surgery will not be allowed to drive home, and someone needs to stay with them for 24 hours.  Special instructions:   [place]- Preparing For Surgery  Before surgery, you can play an important role. Because skin is not sterile, your skin needs to be as free of germs as possible. You can reduce the number of germs on your skin by washing with CHG (chlorahexidine gluconate) Soap before surgery.  CHG is an antiseptic cleaner which kills germs and bonds with the skin to continue killing germs even after washing.    Oral Hygiene is also important to reduce your risk of infection.  Remember - BRUSH YOUR TEETH THE MORNING OF SURGERY WITH YOUR REGULAR TOOTHPASTE  Please do not use if you have an allergy to CHG or antibacterial soaps. If your skin becomes reddened/irritated stop using the CHG.  Do not shave (including legs and underarms) for at least 48 hours prior to first CHG shower. It is OK to shave your face.  Please follow these instructions carefully.   Shower the NIGHT BEFORE SURGERY and the MORNING OF SURGERY with DIAL Soap.   Pat yourself dry with a CLEAN TOWEL.  Wear CLEAN PAJAMAS to bed the night before surgery  Place  CLEAN SHEETS on your bed the night of your first shower and DO NOT SLEEP WITH PETS.   Day of Surgery: Please shower morning of surgery  Wear Clean/Comfortable clothing the morning of surgery Do not apply any deodorants/lotions.   Remember to brush your teeth WITH YOUR REGULAR TOOTHPASTE.   Questions were answered. Patient verbalized understanding of instructions.

## 2022-04-12 DIAGNOSIS — B009 Herpesviral infection, unspecified: Secondary | ICD-10-CM | POA: Diagnosis not present

## 2022-04-12 DIAGNOSIS — G4700 Insomnia, unspecified: Secondary | ICD-10-CM | POA: Diagnosis not present

## 2022-04-12 DIAGNOSIS — J439 Emphysema, unspecified: Secondary | ICD-10-CM | POA: Diagnosis not present

## 2022-04-12 DIAGNOSIS — K219 Gastro-esophageal reflux disease without esophagitis: Secondary | ICD-10-CM | POA: Diagnosis not present

## 2022-04-12 DIAGNOSIS — R32 Unspecified urinary incontinence: Secondary | ICD-10-CM | POA: Diagnosis not present

## 2022-04-12 DIAGNOSIS — E785 Hyperlipidemia, unspecified: Secondary | ICD-10-CM | POA: Diagnosis not present

## 2022-04-12 DIAGNOSIS — Z6836 Body mass index (BMI) 36.0-36.9, adult: Secondary | ICD-10-CM | POA: Diagnosis not present

## 2022-04-12 DIAGNOSIS — Z008 Encounter for other general examination: Secondary | ICD-10-CM | POA: Diagnosis not present

## 2022-04-12 DIAGNOSIS — C50911 Malignant neoplasm of unspecified site of right female breast: Secondary | ICD-10-CM | POA: Diagnosis not present

## 2022-04-12 DIAGNOSIS — R600 Localized edema: Secondary | ICD-10-CM | POA: Diagnosis not present

## 2022-04-12 DIAGNOSIS — I10 Essential (primary) hypertension: Secondary | ICD-10-CM | POA: Diagnosis not present

## 2022-04-12 DIAGNOSIS — M1990 Unspecified osteoarthritis, unspecified site: Secondary | ICD-10-CM | POA: Diagnosis not present

## 2022-04-15 ENCOUNTER — Encounter (HOSPITAL_COMMUNITY): Payer: Self-pay | Admitting: Pulmonary Disease

## 2022-04-15 ENCOUNTER — Ambulatory Visit (HOSPITAL_COMMUNITY): Payer: Medicare HMO

## 2022-04-15 ENCOUNTER — Other Ambulatory Visit: Payer: Self-pay

## 2022-04-15 ENCOUNTER — Ambulatory Visit (HOSPITAL_COMMUNITY): Payer: Medicare HMO | Admitting: Certified Registered Nurse Anesthetist

## 2022-04-15 ENCOUNTER — Ambulatory Visit (HOSPITAL_BASED_OUTPATIENT_CLINIC_OR_DEPARTMENT_OTHER): Payer: Medicare HMO | Admitting: Certified Registered Nurse Anesthetist

## 2022-04-15 ENCOUNTER — Encounter (HOSPITAL_COMMUNITY)
Admission: RE | Disposition: A | Discharge status: Home / Self Care | Payer: Self-pay | Source: Home / Self Care | Attending: Pulmonary Disease

## 2022-04-15 ENCOUNTER — Ambulatory Visit (HOSPITAL_COMMUNITY)
Admission: RE | Admit: 2022-04-15 | Discharge: 2022-04-15 | Disposition: A | Discharge status: Home / Self Care | Payer: Medicare HMO | Attending: Pulmonary Disease | Admitting: Pulmonary Disease

## 2022-04-15 DIAGNOSIS — I10 Essential (primary) hypertension: Secondary | ICD-10-CM

## 2022-04-15 DIAGNOSIS — R69 Illness, unspecified: Secondary | ICD-10-CM | POA: Diagnosis not present

## 2022-04-15 DIAGNOSIS — M1990 Unspecified osteoarthritis, unspecified site: Secondary | ICD-10-CM | POA: Insufficient documentation

## 2022-04-15 DIAGNOSIS — C3431 Malignant neoplasm of lower lobe, right bronchus or lung: Secondary | ICD-10-CM | POA: Diagnosis not present

## 2022-04-15 DIAGNOSIS — Z87891 Personal history of nicotine dependence: Secondary | ICD-10-CM | POA: Insufficient documentation

## 2022-04-15 DIAGNOSIS — J449 Chronic obstructive pulmonary disease, unspecified: Secondary | ICD-10-CM

## 2022-04-15 DIAGNOSIS — K219 Gastro-esophageal reflux disease without esophagitis: Secondary | ICD-10-CM | POA: Diagnosis not present

## 2022-04-15 DIAGNOSIS — F419 Anxiety disorder, unspecified: Secondary | ICD-10-CM | POA: Insufficient documentation

## 2022-04-15 DIAGNOSIS — J9811 Atelectasis: Secondary | ICD-10-CM | POA: Diagnosis not present

## 2022-04-15 DIAGNOSIS — Z79899 Other long term (current) drug therapy: Secondary | ICD-10-CM | POA: Diagnosis not present

## 2022-04-15 DIAGNOSIS — R918 Other nonspecific abnormal finding of lung field: Secondary | ICD-10-CM | POA: Diagnosis not present

## 2022-04-15 DIAGNOSIS — R846 Abnormal cytological findings in specimens from respiratory organs and thorax: Secondary | ICD-10-CM | POA: Diagnosis not present

## 2022-04-15 HISTORY — PX: BRONCHIAL BIOPSY: SHX5109

## 2022-04-15 HISTORY — PX: BRONCHIAL BRUSHINGS: SHX5108

## 2022-04-15 HISTORY — PX: BRONCHIAL NEEDLE ASPIRATION BIOPSY: SHX5106

## 2022-04-15 HISTORY — PX: FIDUCIAL MARKER PLACEMENT: SHX6858

## 2022-04-15 HISTORY — PX: VIDEO BRONCHOSCOPY WITH RADIAL ENDOBRONCHIAL ULTRASOUND: SHX6849

## 2022-04-15 LAB — BASIC METABOLIC PANEL
Anion gap: 9 (ref 5–15)
BUN: 21 mg/dL (ref 8–23)
CO2: 26 mmol/L (ref 22–32)
Calcium: 9.3 mg/dL (ref 8.9–10.3)
Chloride: 104 mmol/L (ref 98–111)
Creatinine, Ser: 1.25 mg/dL — ABNORMAL HIGH (ref 0.44–1.00)
GFR, Estimated: 45 mL/min — ABNORMAL LOW (ref >60)
Glucose, Bld: 122 mg/dL — ABNORMAL HIGH (ref 70–99)
Potassium: 3.2 mmol/L — ABNORMAL LOW (ref 3.5–5.1)
Sodium: 139 mmol/L (ref 135–145)

## 2022-04-15 LAB — CBC
HCT: 40 % (ref 36.0–46.0)
Hemoglobin: 12.9 g/dL (ref 12.0–15.0)
MCH: 26.8 pg (ref 26.0–34.0)
MCHC: 32.3 g/dL (ref 30.0–36.0)
MCV: 83 fL (ref 80.0–100.0)
Platelets: 249 10*3/uL (ref 150–400)
RBC: 4.82 MIL/uL (ref 3.87–5.11)
RDW: 13.1 % (ref 11.5–15.5)
WBC: 5.5 10*3/uL (ref 4.0–10.5)
nRBC: 0 % (ref 0.0–0.2)

## 2022-04-15 SURGERY — BRONCHOSCOPY, WITH BIOPSY USING ELECTROMAGNETIC NAVIGATION
Anesthesia: General | Laterality: Bilateral

## 2022-04-15 MED ORDER — AMISULPRIDE (ANTIEMETIC) 5 MG/2ML IV SOLN: 10.0000 mg | Freq: Once | INTRAVENOUS | Status: DC | PRN

## 2022-04-15 MED ORDER — FENTANYL CITRATE (PF) 100 MCG/2ML IJ SOLN: 25.0000 ug | INTRAMUSCULAR | Status: DC | PRN

## 2022-04-15 MED ORDER — LIDOCAINE 2% (20 MG/ML) 5 ML SYRINGE
INTRAMUSCULAR | Status: DC | PRN
  Administered 2022-04-15: 40 mg via INTRAVENOUS
  Administered 2022-04-15: 60 mg via INTRAVENOUS

## 2022-04-15 MED ORDER — ONDANSETRON HCL 4 MG/2ML IJ SOLN
INTRAMUSCULAR | Status: DC | PRN
  Administered 2022-04-15: 4 mg via INTRAVENOUS

## 2022-04-15 MED ORDER — ONDANSETRON HCL 4 MG/2ML IJ SOLN: 4.0000 mg | Freq: Once | INTRAMUSCULAR | Status: DC | PRN

## 2022-04-15 MED ORDER — PROPOFOL 500 MG/50ML IV EMUL
INTRAVENOUS | Status: DC | PRN
  Administered 2022-04-15: 125 ug/kg/min via INTRAVENOUS

## 2022-04-15 MED ORDER — ROCURONIUM BROMIDE 10 MG/ML (PF) SYRINGE
PREFILLED_SYRINGE | INTRAVENOUS | Status: DC | PRN
  Administered 2022-04-15: 40 mg via INTRAVENOUS

## 2022-04-15 MED ORDER — PROPOFOL 10 MG/ML IV BOLUS
INTRAVENOUS | Status: DC | PRN
  Administered 2022-04-15: 150 mg via INTRAVENOUS

## 2022-04-15 MED ORDER — CEFAZOLIN SODIUM-DEXTROSE 2-3 GM-%(50ML) IV SOLR
INTRAVENOUS | Status: DC | PRN
  Administered 2022-04-15: 2 g via INTRAVENOUS

## 2022-04-15 MED ORDER — ACETAMINOPHEN 10 MG/ML IV SOLN: 1000.0000 mg | Freq: Once | INTRAVENOUS | Status: DC | PRN

## 2022-04-15 MED ORDER — CHLORHEXIDINE GLUCONATE 0.12 % MT SOLN
OROMUCOSAL | Status: AC
  Filled 2022-04-15: qty 15

## 2022-04-15 MED ORDER — SUGAMMADEX SODIUM 200 MG/2ML IV SOLN
INTRAVENOUS | Status: DC | PRN
  Administered 2022-04-15: 200 mg via INTRAVENOUS

## 2022-04-15 MED ORDER — FENTANYL CITRATE (PF) 100 MCG/2ML IJ SOLN
INTRAMUSCULAR | Status: DC | PRN
  Administered 2022-04-15: 100 ug via INTRAVENOUS

## 2022-04-15 MED ORDER — DEXAMETHASONE SODIUM PHOSPHATE 10 MG/ML IJ SOLN
INTRAMUSCULAR | Status: DC | PRN
  Administered 2022-04-15: 10 mg via INTRAVENOUS

## 2022-04-15 MED ORDER — LACTATED RINGERS IV SOLN
INTRAVENOUS | Status: DC
Start: 2022-04-15 — End: 2022-04-15

## 2022-04-15 SURGICAL SUPPLY — 1 items: superlock fiducial marker ×1 IMPLANT

## 2022-04-15 NOTE — Anesthesia Postprocedure Evaluation (Signed)
Anesthesia Post Note  Patient: Callaway Degollado  Procedure(s) Performed: ROBOTIC ASSISTED NAVIGATIONAL BRONCHOSCOPY (Bilateral) BRONCHIAL BIOPSIES BRONCHIAL NEEDLE ASPIRATION BIOPSIES BRONCHIAL BRUSHINGS VIDEO BRONCHOSCOPY WITH RADIAL ENDOBRONCHIAL ULTRASOUND FIDUCIAL MARKER PLACEMENT     Patient location during evaluation: PACU Anesthesia Type: General Level of consciousness: awake Pain management: pain level controlled Vital Signs Assessment: post-procedure vital signs reviewed and stable Respiratory status: spontaneous breathing, nonlabored ventilation, respiratory function stable and patient connected to nasal cannula oxygen Cardiovascular status: blood pressure returned to baseline and stable Postop Assessment: no apparent nausea or vomiting Anesthetic complications: no   No notable events documented.  Last Vitals:  Vitals:   04/15/22 1200 04/15/22 1215  BP: (!) 153/75 (!) 146/73  Pulse: 93 84  Resp: 15 10  Temp:  37.1 C  SpO2: 96% 95%    Last Pain:  Vitals:   04/15/22 1200  TempSrc:   PainSc: 0-No pain                 Mccartney Chuba P Orvie Caradine

## 2022-04-15 NOTE — Interval H&P Note (Signed)
History and Physical Interval Note:  04/15/2022 8:51 AM  Renee Maldonado  has presented today for surgery, with the diagnosis of lung nodules.  The various methods of treatment have been discussed with the patient and family. After consideration of risks, benefits and other options for treatment, the patient has consented to  Procedure(s) with comments: ROBOTIC ASSISTED NAVIGATIONAL BRONCHOSCOPY (Bilateral) - ION w/ CIOS as a surgical intervention.  The patient's history has been reviewed, patient examined, no change in status, stable for surgery.  I have reviewed the patient's chart and labs.  Questions were answered to the patient's satisfaction.     [place]

## 2022-04-15 NOTE — Transfer of Care (Signed)
Immediate Anesthesia Transfer of Care Note  [name]  Procedure(s) Performed: ROBOTIC ASSISTED NAVIGATIONAL BRONCHOSCOPY (Bilateral) BRONCHIAL BIOPSIES BRONCHIAL NEEDLE ASPIRATION BIOPSIES BRONCHIAL BRUSHINGS VIDEO BRONCHOSCOPY WITH RADIAL ENDOBRONCHIAL ULTRASOUND FIDUCIAL MARKER PLACEMENT  Patient Location: PACU  Anesthesia Type:General  Level of Consciousness: awake and alert   Airway & Oxygen Therapy: Patient Spontanous Breathing and Patient connected to face mask oxygen  Post-op Assessment: Report given to RN and Post -op Vital signs reviewed and stable  Post vital signs: Reviewed and stable  Last Vitals:  Vitals Value Taken Time  BP 127/75 04/15/22 1131  Temp 37.2 C 04/15/22 1130  Pulse 87 04/15/22 1134  Resp 11 04/15/22 1134  SpO2 98 % 04/15/22 1134  Vitals shown include unvalidated device data.  Last Pain:  Vitals:   04/15/22 1130  TempSrc:   PainSc: 0-No pain         Complications: No notable events documented.

## 2022-04-15 NOTE — Op Note (Signed)
Video Bronchoscopy with Robotic Assisted Bronchoscopic Navigation   Date of Operation: 04/15/2022   Pre-op Diagnosis: Pulmonary nodules  Post-op Diagnosis: Pulmonary nodules  Surgeon: Garner Nash, DO  Assistants: none   Anesthesia: General endotracheal anesthesia  Operation: Flexible video fiberoptic bronchoscopy with robotic assistance and biopsies.  Estimated Blood Loss: Minimal  Complications: None  Indications and History: Renee Maldonado is a 76 y.o. female with history of right-sided pulmonary nodules. The risks, benefits, complications, treatment options and expected outcomes were discussed with the patient.  The possibilities of pneumothorax, pneumonia, reaction to medication, pulmonary aspiration, perforation of a viscus, bleeding, failure to diagnose a condition and creating a complication requiring transfusion or operation were discussed with the patient who freely signed the consent.    Description of Procedure: The patient was seen in the Preoperative Area, was examined and was deemed appropriate to proceed.  The patient was taken to [facility] endoscopy room 3, identified as Renee Maldonado and the procedure verified as Flexible Video Fiberoptic Bronchoscopy.  A Time Out was held and the above information confirmed.   Prior to the date of the procedure a high-resolution CT scan of the chest was performed. Utilizing ION software program a virtual tracheobronchial tree was generated to allow the creation of distinct navigation pathways to the patient's parenchymal abnormalities. After being taken to the operating room general anesthesia was initiated and the patient  was orally intubated. The video fiberoptic bronchoscope was introduced via the endotracheal tube and a general inspection was performed which showed normal right and left lung anatomy, aspiration of the bilateral mainstems was completed to remove any remaining secretions. Robotic catheter inserted into patient's endotracheal  tube.   Target #1 right lower lobe pulmonary nodule: The distinct navigation pathways prepared prior to this procedure were then utilized to navigate to patient's lesion identified on CT scan. The robotic catheter was secured into place and the vision probe was withdrawn.  Lesion location was approximated using fluoroscopy, three-dimensional cone beam CT imaging and radial endobronchial ultrasound for peripheral targeting. Under fluoroscopic guidance transbronchial needle brushings, transbronchial needle biopsies, and transbronchial forceps biopsies were performed to be sent for cytology and pathology.   Target #2 right upper lobe pulmonary nodule: The distinct navigation pathways prepared prior to this procedure were then utilized to navigate to patient's lesion identified on CT scan. The robotic catheter was secured into place and the vision probe was withdrawn.  Lesion location was approximated using fluoroscopy, three-dimensional, and CT imaging and radial endobronchial ultrasound for peripheral targeting. Under fluoroscopic guidance transbronchial needle brushings, transbronchial needle biopsies, and transbronchial forceps biopsies were performed to be sent for cytology and pathology.  Following tissue sampling a single fiducial was placed using fluoroscopic guidance with fiducial catheter wire and delivery kit.  At the end of the procedure a general airway inspection was performed and there was no evidence of active bleeding. The bronchoscope was removed.  The patient tolerated the procedure well. There was no significant blood loss and there were no obvious complications. A post-procedural chest x-ray is pending.  Samples Target #1: 1. Transbronchial needle brushings from right lower lobe 2. Transbronchial Wang needle biopsies from right lower lobe 3. Transbronchial forceps biopsies from right lower lobe  Samples Target #2: 1. Transbronchial needle brushings from right upper lobe 2.  Transbronchial Wang needle biopsies from right upper lobe 3. Transbronchial forceps biopsies from right upper lobe  Plans:  The patient will be discharged from the PACU to home when recovered from anesthesia and  after chest x-ray is reviewed. We will review the cytology, pathology and microbiology results with the patient when they become available. Outpatient followup will be with Garner Nash, DO.   Garner Nash, DO [facility] 04/15/2022 11:40 AM

## 2022-04-15 NOTE — Anesthesia Procedure Notes (Signed)
Procedure Name: Intubation Date/Time: 04/15/2022 10:21 AM Performed by: Betha Loa, CRNA Pre-anesthesia Checklist: Patient identified, Emergency Drugs available, Suction available and Patient being monitored Patient Re-evaluated:Patient Re-evaluated prior to induction Oxygen Delivery Method: Circle System Utilized Preoxygenation: Pre-oxygenation with 100% oxygen Induction Type: IV induction Ventilation: Mask ventilation without difficulty Laryngoscope Size: Mac and 3 Grade View: Grade I Tube type: Oral Tube size: 8.5 mm Number of attempts: 1 Airway Equipment and Method: Stylet and Oral airway Placement Confirmation: ETT inserted through vocal cords under direct vision, positive ETCO2 and breath sounds checked- equal and bilateral Secured at: 23 cm Tube secured with: Tape Dental Injury: Teeth and Oropharynx as per pre-operative assessment

## 2022-04-15 NOTE — Anesthesia Preprocedure Evaluation (Signed)
Anesthesia Evaluation  Patient identified by MRN, date of birth, ID band Patient awake    Reviewed: Allergy & Precautions, NPO status , Patient's Chart, lab work & pertinent test results  Airway Mallampati: II  TM Distance: >3 FB Neck ROM: Full    Dental no notable dental hx.    Pulmonary COPD,  COPD inhaler, former smoker,    Pulmonary exam normal        Cardiovascular hypertension, Pt. on medications Normal cardiovascular exam     Neuro/Psych PSYCHIATRIC DISORDERS Anxiety negative neurological ROS     GI/Hepatic Neg liver ROS, GERD  Medicated and Controlled,  Endo/Other  negative endocrine ROS  Renal/GU negative Renal ROS     Musculoskeletal  (+) Arthritis ,   Abdominal (+) + obese,   Peds  Hematology negative hematology ROS (+)   Anesthesia Other Findings lung nodules  Reproductive/Obstetrics                             Anesthesia Physical Anesthesia Plan  ASA: 3  Anesthesia Plan: General   Post-op Pain Management:    Induction: Intravenous  PONV Risk Score and Plan: 3 and Ondansetron, Dexamethasone and Treatment may vary due to age or medical condition  Airway Management Planned: Oral ETT  Additional Equipment:   Intra-op Plan:   Post-operative Plan: Extubation in OR  Informed Consent: I have reviewed the patients History and Physical, chart, labs and discussed the procedure including the risks, benefits and alternatives for the proposed anesthesia with the patient or authorized representative who has indicated his/her understanding and acceptance.     Dental advisory given  Plan Discussed with: CRNA  Anesthesia Plan Comments:         Anesthesia Quick Evaluation

## 2022-04-15 NOTE — Discharge Instructions (Signed)
Flexible Bronchoscopy, Care After This sheet gives you information about how to care for yourself after your test. Your doctor may also give you more specific instructions. If you have problems or questions, contact your doctor. Follow these instructions at home: Eating and drinking Do not eat or drink anything (not even water) for 2 hours after your test, or until your numbing medicine (local anesthetic) wears off. When your numbness is gone and your cough and gag reflexes have come back, you may: Eat only soft foods. Slowly drink liquids. The day after the test, go back to your normal diet. Driving Do not drive for 24 hours if you were given a medicine to help you relax (sedative). Do not drive or use heavy machinery while taking prescription pain medicine. General instructions  Take over-the-counter and prescription medicines only as told by your doctor. Return to your normal activities as told. Ask what activities are safe for you. Do not use any products that have nicotine or tobacco in them. This includes cigarettes and e-cigarettes. If you need help quitting, ask your doctor. Keep all follow-up visits as told by your doctor. This is important. It is very important if you had a tissue sample (biopsy) taken. Get help right away if: You have shortness of breath that gets worse. You get light-headed. You feel like you are going to pass out (faint). You have chest pain. You cough up: More than a little blood. More blood than before. Summary Do not eat or drink anything (not even water) for 2 hours after your test, or until your numbing medicine wears off. Do not use cigarettes. Do not use e-cigarettes. Get help right away if you have chest pain.  This information is not intended to replace advice given to you by your health care provider. Make sure you discuss any questions you have with your health care provider. Document Released: 08/31/2009 Document Revised: 10/16/2017 Document  Reviewed: 11/21/2016 Elsevier Patient Education  2020 [employer].

## 2022-04-16 ENCOUNTER — Encounter (HOSPITAL_COMMUNITY): Payer: Self-pay | Admitting: Pulmonary Disease

## 2022-04-16 ENCOUNTER — Telehealth: Payer: Self-pay | Admitting: Pulmonary Disease

## 2022-04-16 DIAGNOSIS — M8588 Other specified disorders of bone density and structure, other site: Secondary | ICD-10-CM | POA: Diagnosis not present

## 2022-04-16 DIAGNOSIS — M8589 Other specified disorders of bone density and structure, multiple sites: Secondary | ICD-10-CM | POA: Diagnosis not present

## 2022-04-16 DIAGNOSIS — Z1231 Encounter for screening mammogram for malignant neoplasm of breast: Secondary | ICD-10-CM | POA: Diagnosis not present

## 2022-04-16 DIAGNOSIS — Z170 Estrogen receptor positive status [ER+]: Secondary | ICD-10-CM | POA: Diagnosis not present

## 2022-04-16 DIAGNOSIS — M85851 Other specified disorders of bone density and structure, right thigh: Secondary | ICD-10-CM | POA: Diagnosis not present

## 2022-04-16 DIAGNOSIS — C50311 Malignant neoplasm of lower-inner quadrant of right female breast: Secondary | ICD-10-CM | POA: Diagnosis not present

## 2022-04-16 LAB — CYTOLOGY - NON PAP

## 2022-04-16 NOTE — Telephone Encounter (Signed)
ATC patient this morning 5/31 but no voicemail picked up and was unable to leave voicemail. Will try to call again

## 2022-04-16 NOTE — Telephone Encounter (Signed)
Patient had bronchoscopy yesterday so coughing up small amount of blood is not unexpected and likely from irritation. If she begins to cough up clots bigger than a quarter or she develops worsening respiratory symptoms, please seek emergency care. Thanks

## 2022-04-16 NOTE — Telephone Encounter (Signed)
Called and spoke to patient and she voiced understanding of response. Nothing further needed.

## 2022-04-16 NOTE — Telephone Encounter (Signed)
Patient states she has coughed up 3 dime sized blood clot-like after endoscopy. She wants to know if this is normal after an endoscopy.  She states she is breathing normal and has no other issues.   Dr. Valeta Harms is out of office, Roxan Diesel please advise. Thanks!   *patient will be unavailable after 12 but states we can leave a vm if we dont get her.*

## 2022-04-21 NOTE — Progress Notes (Signed)
Thoracic Location of Tumor / Histology: Multiple Lung Nodules  Biopsies  04/15/2022 DG Chest Port 1 View CLINICAL DATA:  Status post bronchoscopy  FINDINGS: The heart size and mediastinal contours are within normal limits.  New fiduciary clips within the medial aspect of the right upper lobe. Mild bibasilar atelectasis. No pleural effusion or pneumothorax. The visualized skeletal structures are unremarkable.   IMPRESSION: Mild bibasilar atelectasis. No pneumothorax.  03/05/2022 CT Chest without Contrast: FINDINGS: Cardiovascular: Atherosclerosis of the thoracic aorta. There are coronary artery calcifications. The heart is normal in size. No pericardial effusion.   Mediastinum/Nodes: No enlarged mediastinal lymph nodes. There is no bulky hilar adenopathy on this unenhanced exam. Soft tissue nodule posterior to the esophagus at the thoracic inlet, possibly contiguous with the posterior right thyroid lobe measures 2.1 x 1.6 cm. This is not significantly changed in size from prior CT. Thyroid ultrasound was performed same day. Decompressed esophagus.   Lungs/Pleura: There right apical pulmonary nodule on prior cervical spine CT is irregular in shape, however appears contracted from prior exam. The previous craniocaudal dimension of 10 mm currently measures 7 mm, series 5, image 112. This measures 10 mm in transverse diameter, however has diminished from prior. This currently appears to be a sub solid nodule.   There is a ground-glass nodule in the right lower lobe measuring 14 x 14 mm, series 4, image 74. There is a 4 x 6 mm solid component on series 4, image 75 and series 5, image 116.   Vague ground-glass nodule in the dependent right lower lobe series 4, image 106 and coronal series 5, image 106, this measures approximately 10 mm. No solid components.   Tiny subpleural nodules in the right lower lobe series 4, image 78 and left lower lobe series 4 image 96. Tiny perifissural right lower lobe  nodule series 4, image 89. Small linear perifissural opacity in the left upper lobe, series 4, image 56, likely scarring.   Linear/bandlike atelectasis or scarring within both lower lobes.  There is central bronchial thickening. Minimal biapical pleuroparenchymal scarring. There is no pleural fluid.   Upper Abdomen: No acute upper abdominal findings. There is mild fatty atrophy of the pancreas.   Musculoskeletal: Mild T8 superior endplate compression deformity with prominent Schmorl's node in T6 superior endplate. Bilateral glenohumeral osteoarthritis. No focal bone lesion. No chest wall soft tissue abnormalities.   IMPRESSION: 1. Right apical pulmonary nodule on prior cervical spine CT is irregular in shape, however appears contracted in decreased in size from prior exam. This currently appears to be a sub solid nodule.  Please see below for additional chest findings and follow-up recommendations. 2. There is a 14 x 14 mm ground-glass nodule in the right lower lobe. There is a 4 x 6 mm solid component, increasing suspicion for indolent bronchogenic malignancy. There is an additional vague ground-glass nodule in the dependent right lower lobe measuring 10 mm. Non-contrast chest CT at 3-6 months is recommended. If nodules persist, subsequent management will be based upon the most suspicious nodule(s). This recommendation follows the consensus statement: Guidelines for Management of Incidental Pulmonary Nodules Detected on CT Images: From the Fleischner Society 2017; Radiology 2017; 284:228-243. 3. Tiny subpleural nodules in the right lower lobe and left lower lobe, which can be assessed on follow-up. 4. Soft tissue nodule posterior to the esophagus at the thoracic inlet, possibly contiguous with the posterior right thyroid lobe measures 2.1 x 1.6 cm. This is not significantly changed in size from prior CT. Thyroid  ultrasound was performed same day. 5. Mild T8 superior endplate compression fracture,  age indeterminate. 6. Central bronchial thickening. Coronary artery calcifications. Aortic Atherosclerosis (ICD10-I70.0).  03/03/2022 US Soft Tissue Head & Neck ( Non-Thyroid)  FINDINGS: Parenchymal Echotexture: Moderately heterogenous   Isthmus: 0.1 cm   Right lobe: 3.8 x 1.7 x 1.8 cm   Left lobe: 3.7 x 1.3 x 1.3 cm   _________________________________________________________   Estimated total number of nodules >/= 1 cm: 1   Number of spongiform nodules >/=  2 cm not described below (TR1): 0   Number of mixed cystic and solid nodules >/= 1.5 cm not described below (TR2): 0   _________________________________________________________   Nodule labeled 1 appears to be a solid very hypoechoic (versus possibly anechoic) TR 4 nodule in the inferior aspect of the right thyroid lobe that measures 1.5 x 1.4 x 1.0 cm. **Given size (>/= 1.5 cm) and appearance, fine needle aspiration of this moderately suspicious nodule should be considered based on TI-RADS criteria.  Note that this nodule does NOT correspond to the finding on CT (see series 4, image 63 on comparison CT dated January 03, 2022).   IMPRESSION: 1. Nodule labeled 1 on this exam is difficult to discern between a cystic nodule versus very hypoechoic solid nodule. Recommend the patient be scheduled for biopsy where additional evaluation by the radiologist with real-time sonography can be performed. 2. Note that the exophytic thyroid nodule identified on comparison CT cervical spine is NOT identified on this exam. Its location within the deep cervical soft tissues and posterior to the trachea may make accurate characterization by ultrasound difficult or impossible. Further evaluation by ultrasound can be attempted at the time of scheduled biopsy of the above finding.    01/03/2022 CT Cervical Spine without Contrast CLINICAL DATA:  [place], hit head, posterior scalp hematoma  Alignment: Alignment is anatomic.   Skull base and  vertebrae: No acute fracture. No primary bone lesion or focal pathologic process.   Soft tissues and spinal canal: No prevertebral fluid or swelling. No visible canal hematoma.   Disc levels: Mild cervical spondylosis most pronounced at C5-6. No significant bony encroachment upon the central canal or neural foramina.   Upper chest: Airway is patent. Curvilinear somewhat spiculated density within the right apex measures 1.1 x 0.8 by 0.9 cm. This may extend to the pleural surface and could reflect scarring, though is incompletely evaluated on this study. Recommend follow-up unenhanced chest CT for complete characterization.   2.1 x 2.0 by 2.6 cm nodule in the right paratracheal region has similar attenuation to the thyroid and appears contiguous with the right lobe, consistent with exophytic thyroid nodule. Follow-up thyroid ultrasound may be useful for further evaluation.   Other: Reconstructed images demonstrate no additional findings.   IMPRESSION: 1. No acute cervical spine fracture. 2. 10 mm suspicious right solid pulmonary nodule within the upper lobe detected on incomplete chest CT. Recommend non-contrast Chest CT for further evaluation.  These guidelines do not apply to immunocompromised patients and patients with cancer. Follow up in patients with significant comorbidities as clinically warranted. For lung cancer screening, adhere to Lung-RADS guidelines.   Tobacco/Marijuana/Snuff/ETOH use: Former (09/2014), no drug or smokeless tobacco, yes to alcohol use.  Past/Anticipated interventions by cardiothoracic surgery, if any:   04/15/2022 Robotic Assisted Navigational Bronchoscopy  Past/Anticipated interventions by medical oncology, if any: NA  Signs/Symptoms Weight changes, if any: {:18581} Respiratory complaints, if any: {:18581} Hemoptysis, if any: {:18581} Pain issues, if any:  {:18581}  SAFETY ISSUES: Prior radiation? {:18581} Pacemaker/ICD? {:18581}  Possible  current pregnancy?  Postmenopausal Is the patient on methotrexate? No  Current Complaints / other details:

## 2022-04-23 ENCOUNTER — Ambulatory Visit
Admission: RE | Admit: 2022-04-23 | Discharge: 2022-04-23 | Disposition: A | Discharge status: Home / Self Care | Payer: Medicare HMO | Source: Ambulatory Visit | Attending: Radiation Oncology | Admitting: Radiation Oncology

## 2022-04-23 ENCOUNTER — Other Ambulatory Visit: Payer: Self-pay

## 2022-04-23 DIAGNOSIS — R918 Other nonspecific abnormal finding of lung field: Secondary | ICD-10-CM

## 2022-04-23 DIAGNOSIS — Z87891 Personal history of nicotine dependence: Secondary | ICD-10-CM | POA: Diagnosis not present

## 2022-04-23 DIAGNOSIS — C3491 Malignant neoplasm of unspecified part of right bronchus or lung: Secondary | ICD-10-CM

## 2022-04-23 DIAGNOSIS — C3431 Malignant neoplasm of lower lobe, right bronchus or lung: Secondary | ICD-10-CM | POA: Diagnosis not present

## 2022-04-23 NOTE — Progress Notes (Signed)
Radiation Oncology         [phone] ________________________________  Initial outpatient Consultation  Name: Renee Maldonado MRN: [id number]  Date of Service: 04/23/2022 DOB: 03-07-1946  DP:[id number], Cletus Gash, MD  Garner Nash, DO   REFERRING PHYSICIAN: Garner Nash, DO  DIAGNOSIS: 76 year old female with newly diagnosed NSCLC, adenocarcinoma of the right lower lobe lung, presumed to be Stage IA but PET scan pending to complete disease staging.    ICD-10-CM   1. Non-small cell lung cancer, right (HCC)  C34.91       HISTORY OF PRESENT ILLNESS: Renee Maldonado is a 76 y.o. female seen at the request of Dr. Valeta Harms.  She is a current smoker with a history of breast cancer, previously treated with lumpectomy only, did not require adjuvant radiation, and a newly diagnosed papillary thyroid cancer.  Her medical oncologist is Dr. Tor Netters at [facility].  She had a recent fall at home in February 2023 so her PCP ordered CT neck and head imaging for further evaluation.  On the CT neck imaging, she was incidentally noted to have a 1.1 x 0.8 x 0.9 cm nodule in the right upper lung apex as well as a 2.6 cm peritracheal nodule felt to be an exophytic thyroid nodule.  She had thyroid ultrasound and biopsy on 03/13/2022 with pathology confirming papillary carcinoma.  A follow-up chest CT on 03/03/2022, for further evaluation of the lung nodule confirmed a subsolid nodule in the right lung apex as well as a 1.4 cm groundglass nodule in the right lower lobe lung, suspicious for indolent bronchogenic malignancy.  The thyroid nodule was again noted and unchanged in size or appearance.  She met with Dr. Valeta Harms on 03/26/2022 and subsequently underwent robotic assisted bronchoscopy with biopsy sampling of the right upper lobe and right lower lobe lung lesions on 04/15/2022.  Final surgical pathology confirms a NSCLC, adenocarcinoma in the right lower lobe lung lesion.  Samples from the  right upper lobe lung lesion returned atypical cells only, no definite malignancy.  She has not yet had PET imaging to complete her disease staging but was kindly referred to [place] today to discuss the potential role of radiotherapy in the management of early stage non-small cell lung cancers.  PREVIOUS RADIATION THERAPY: No  PAST MEDICAL HISTORY:  Past Medical History:  Diagnosis Date   Anxiety    Arthritis    Cancer ([place])    breast Right no chemo or radiation  on Arimidex   COPD (chronic obstructive pulmonary disease) ([place])    on Breo   GERD (gastroesophageal reflux disease)    Humerus fracture 2007   Hyperlipidemia    Hypertension    Insomnia    OA (osteoarthritis) of knee 10/08/2020      PAST SURGICAL HISTORY: Past Surgical History:  Procedure Laterality Date   BREAST LUMPECTOMY Right 02/2019   BREAST SURGERY Right 01/2019   breast biopsy   BRONCHIAL BIOPSY  04/15/2022   Procedure: BRONCHIAL BIOPSIES;  Surgeon: Garner Nash, DO;  Location: [facility];  Service: Pulmonary;;   BRONCHIAL BRUSHINGS  04/15/2022   Procedure: BRONCHIAL BRUSHINGS;  Surgeon: Garner Nash, DO;  Location: [facility];  Service: Pulmonary;;   BRONCHIAL NEEDLE ASPIRATION BIOPSY  04/15/2022   Procedure: BRONCHIAL NEEDLE ASPIRATION BIOPSIES;  Surgeon: Garner Nash, DO;  Location: [facility];  Service: Pulmonary;;   FIDUCIAL MARKER PLACEMENT  04/15/2022   Procedure: FIDUCIAL MARKER PLACEMENT;  Surgeon: Garner Nash, DO;  Location: [facility];  Service: Pulmonary;;   JOINT REPLACEMENT     left knee   Left shoulder surgery     2007   TOTAL KNEE ARTHROPLASTY     2012 left   TOTAL KNEE ARTHROPLASTY Right 10/08/2020   Procedure: TOTAL KNEE ARTHROPLASTY;  Surgeon: Gaynelle Arabian, MD;  Location: [facility];  Service: Orthopedics;  Laterality: Right;   TUBAL LIGATION     VIDEO BRONCHOSCOPY WITH RADIAL ENDOBRONCHIAL ULTRASOUND  04/15/2022   Procedure: VIDEO BRONCHOSCOPY WITH RADIAL ENDOBRONCHIAL  ULTRASOUND;  Surgeon: Garner Nash, DO;  Location: [facility];  Service: Pulmonary;;    FAMILY HISTORY:  Family History  Problem Relation Age of Onset   Alzheimer's disease Mother    Diabetes Mother    Stroke Father    Hypertension Father    Cancer Brother     SOCIAL HISTORY:  Social History   Socioeconomic History   Marital status: Married    Spouse name: Jori Moll   Number of children: 3   Years of education: 16   Highest education level: Bachelor's degree (e.g., BA, AB, BS)  Occupational History   Occupation: retired  Tobacco Use   Smoking status: Former    Packs/day: 0.25    Years: 40.00    Pack years: 10.00    Types: Cigarettes    Quit date: 09/17/2014    Years since quitting: 7.6   Smokeless tobacco: Never   Tobacco comments:    smokes 3 a day  Vaping Use   Vaping Use: Former   Start date: 07/18/2018   Quit date: 07/19/2019  Substance and Sexual Activity   Alcohol use: Yes    Alcohol/week: 0.0 standard drinks    Comment: rarely-     Drug use: No   Sexual activity: Not Currently  Other Topics Concern   Not on file  Social History Narrative   Lives home with her husband. Enjoys travelling, Geographical information systems officer, exercising at Recreation center, eats out a lot   Social Determinants of Radio broadcast assistant Strain: Low Risk    Difficulty of Paying Living Expenses: Not hard at all  Food Insecurity: No Food Insecurity   Worried About Charity fundraiser in the Last Year: Never true   Arboriculturist in the Last Year: Never true  Transportation Needs: No Transportation Needs   Lack of Transportation (Medical): No   Lack of Transportation (Non-Medical): No  Physical Activity: Insufficiently Active   Days of Exercise per Week: 4 days   Minutes of Exercise per Session: 30 min  Stress: No Stress Concern Present   Feeling of Stress : Not at all  Social Connections: Socially Integrated   Frequency of Communication with Friends and Family: More than three times a  week   Frequency of Social Gatherings with Friends and Family: More than three times a week   Attends Religious Services: More than 4 times per year   Active Member of [employer] or Organizations: Yes   Attends Music therapist: More than 4 times per year   Marital Status: Married  Human resources officer Violence: Not At Risk   Fear of Current or Ex-Partner: No   Emotionally Abused: No   Physically Abused: No   Sexually Abused: No    ALLERGIES: Benadryl [diphenhydramine hcl] and Penicillins  MEDICATIONS:  Current Outpatient Medications  Medication Sig Dispense Refill   acetaminophen (TYLENOL) 500 MG tablet Take 500 mg by mouth every 6 (six) hours as needed for moderate  pain.      albuterol (VENTOLIN HFA) 108 (90 Base) MCG/ACT inhaler 2 puffs in lungs every 6 hours as needed for wheezing/short of breath. 8.5 g 5   amLODipine (NORVASC) 5 MG tablet TAKE 1 TABLET DAILY 90 tablet 1   anastrozole (ARIMIDEX) 1 MG tablet Take 1 mg by mouth daily.      aspirin EC 325 MG tablet Take 650 mg by mouth daily as needed for moderate pain.     budesonide-formoterol (SYMBICORT) 160-4.5 MCG/ACT inhaler Inhale 2 puffs into the lungs 2 (two) times daily. 3 each 3   Calcium Carb-Cholecalciferol 600-20 MG-MCG TABS Take 1 tablet by mouth daily.     chlorpheniramine (CHLOR-TRIMETON) 4 MG tablet Take 4 mg by mouth in the morning and at bedtime.      cholecalciferol (VITAMIN D3) 25 MCG (1000 UNIT) tablet Take 1,000 Units by mouth daily.     clindamycin (CLEOCIN) 300 MG capsule Take 300 mg by mouth See admin instructions. Take 600 mg by mouth one hour prior to dental appointment.     cyanocobalamin (,VITAMIN B-12,) 1000 MCG/ML injection Inject 1 mL (1,000 mcg total) into the skin every 30 (thirty) days. 1 mL 11   famotidine (PEPCID) 40 MG tablet Take 1 tablet (40 mg total) by mouth daily. 90 tablet 2   hydrochlorothiazide (HYDRODIURIL) 25 MG tablet Take 1 tablet (25 mg total) by mouth daily. 90 tablet 3    hydrOXYzine (ATARAX/VISTARIL) 25 MG tablet Take 25 mg by mouth every 8 (eight) hours as needed for itching.     raloxifene (EVISTA) 60 MG tablet TAKE ONE TABLET ONCE DAILY 90 tablet 0   RESTASIS 0.05 % ophthalmic emulsion Place 1 drop into both eyes 2 (two) times daily as needed (dry eyes).      rosuvastatin (CRESTOR) 20 MG tablet TAKE 1 TABLET DAILY 90 tablet 0   traZODone (DESYREL) 100 MG tablet Take 1 tablet (100 mg total) by mouth at bedtime. 90 tablet 3   valACYclovir (VALTREX) 500 MG tablet TAKE (1) TABLET DAILY AS NEEDED. 30 tablet 2   Current Facility-Administered Medications  Medication Dose Route Frequency Provider Last Rate Last Admin   cyanocobalamin ((VITAMIN B-12)) injection 1,000 mcg  1,000 mcg Intramuscular Q30 days Claretta Fraise, MD   1,000 mcg at 03/27/22 1006    REVIEW OF SYSTEMS:  On review of systems, the patient reports that she is doing well overall.  She denies any chest pain, shortness of breath, cough, fevers, chills, night sweats, unintended weight changes.  She denies any bowel or bladder disturbances, and denies abdominal pain, nausea or vomiting.  She denies any new musculoskeletal or joint aches or pains. A complete review of systems is obtained and is otherwise negative.    PHYSICAL EXAM:  Wt Readings from Last 3 Encounters:  04/15/22 195 lb (88.5 kg)  03/26/22 199 lb 6.4 oz (90.4 kg)  01/13/22 195 lb 12.8 oz (88.8 kg)   Temp Readings from Last 3 Encounters:  04/15/22 98.7 F (37.1 C)  03/26/22 98.6 F (37 C) (Oral)  03/13/22 97.8 F (36.6 C) (Oral)   BP Readings from Last 3 Encounters:  04/15/22 (!) 146/73  03/26/22 112/74  03/13/22 (!) 154/60   Pulse Readings from Last 3 Encounters:  04/15/22 84  03/26/22 85  03/13/22 73    /10  In general this is a well appearing Caucasian female in no acute distress.  She's alert and oriented x4 and appropriate throughout the examination. Cardiopulmonary assessment is negative  for acute distress and she  exhibits normal effort.   KPS = 90  100 - Normal; no complaints; no evidence of disease. 90   - Able to carry on normal activity; minor signs or symptoms of disease. 80   - Normal activity with effort; some signs or symptoms of disease. 18   - Cares for self; unable to carry on normal activity or to do active work. 60   - Requires occasional assistance, but is able to care for most of his personal needs. 50   - Requires considerable assistance and frequent medical care. 66   - Disabled; requires special care and assistance. 69   - Severely disabled; hospital admission is indicated although death not imminent. 76   - Very sick; hospital admission necessary; active supportive treatment necessary. 10   - Moribund; fatal processes progressing rapidly. 0     - Dead  Karnofsky DA, Abelmann [place], Craver LS and Burchenal JH [phone]) The use of the nitrogen mustards in the palliative treatment of carcinoma: with particular reference to bronchogenic carcinoma Cancer 1 634-56  LABORATORY DATA:  Lab Results  Component Value Date   WBC 5.5 04/15/2022   HGB 12.9 04/15/2022   HCT 40.0 04/15/2022   MCV 83.0 04/15/2022   PLT 249 04/15/2022   Lab Results  Component Value Date   NA 139 04/15/2022   K 3.2 (L) 04/15/2022   CL 104 04/15/2022   CO2 26 04/15/2022   Lab Results  Component Value Date   ALT 16 11/25/2021   AST 19 11/25/2021   ALKPHOS 87 11/25/2021   BILITOT 0.3 11/25/2021     RADIOGRAPHY: DG Chest Port 1 View  Result Date: 04/15/2022 CLINICAL DATA:  Status post bronchoscopy EXAM: PORTABLE CHEST 1 VIEW COMPARISON:  03/03/2022 FINDINGS: The heart size and mediastinal contours are within normal limits. New fiduciary clips within the medial aspect of the right upper lobe. Mild bibasilar atelectasis. No pleural effusion or pneumothorax. The visualized skeletal structures are unremarkable. IMPRESSION: Mild bibasilar atelectasis. No pneumothorax. Electronically Signed   By: Davina Poke  D.O.   On: 04/15/2022 12:18   DG C-ARM BRONCHOSCOPY  Result Date: 04/15/2022 C-ARM BRONCHOSCOPY: Fluoroscopy was utilized by the requesting physician.  No radiographic interpretation.      IMPRESSION/PLAN: 19. 76 y.o. female with newly diagnosed NSCLC, adenocarcinoma of the right lower lobe lung, presumed to be Stage IA but PET scan pending to complete disease staging. Today, we talked to the patient and her husband about the findings and workup thus far. We discussed the natural history of NSCLC and general treatment, highlighting the role of radiotherapy in the management.  We do recommend PET imaging to complete her disease staging.  We discussed the available radiation techniques, and focused on the details and logistics of delivery.  The recommendation is for a 3 fraction course of SBRT to the RLL lung nodule and possibly the RUL lung nodule, pending PET does not show any unexpected diffuse disease.  We reviewed the anticipated acute and late sequelae associated with radiation in this setting. The patient was encouraged to ask questions that were answered to her stated satisfaction.  At the conclusion of our conversation, the patient is interested in proceeding with the recommended 3 fraction course of SBRT, pending the upcoming PET scan confirms that she is indeed a good candidate.  We have placed the order for PET imaging and we will try to get insurance approval and scheduling expedited if at all possible.  I will plan to reach out to her by telephone to review the results of the scan and confirm treatment recommendations so that we can proceed with treatment planning at that time.  We will share our discussion with Dr. Valeta Harms and look forward to continuing to participate in her care.  We personally spent 70 minutes in this encounter including chart review, reviewing radiological studies, meeting face-to-face with the patient, entering orders and completing documentation.    Nicholos Johns,  PA-C    Tyler Pita, MD  [facility] Direct Dial: [phone]  Fax: [phone] [email or web address]  Skype  LinkedIn

## 2022-04-24 ENCOUNTER — Other Ambulatory Visit: Payer: Self-pay | Admitting: *Deleted

## 2022-04-24 ENCOUNTER — Telehealth: Payer: Self-pay | Admitting: *Deleted

## 2022-04-24 DIAGNOSIS — M818 Other osteoporosis without current pathological fracture: Secondary | ICD-10-CM | POA: Diagnosis not present

## 2022-04-24 DIAGNOSIS — R918 Other nonspecific abnormal finding of lung field: Secondary | ICD-10-CM | POA: Diagnosis not present

## 2022-04-24 DIAGNOSIS — Z170 Estrogen receptor positive status [ER+]: Secondary | ICD-10-CM | POA: Diagnosis not present

## 2022-04-24 DIAGNOSIS — C50311 Malignant neoplasm of lower-inner quadrant of right female breast: Secondary | ICD-10-CM | POA: Diagnosis not present

## 2022-04-24 DIAGNOSIS — Z79811 Long term (current) use of aromatase inhibitors: Secondary | ICD-10-CM | POA: Diagnosis not present

## 2022-04-24 DIAGNOSIS — T50905A Adverse effect of unspecified drugs, medicaments and biological substances, initial encounter: Secondary | ICD-10-CM | POA: Diagnosis not present

## 2022-04-24 DIAGNOSIS — R69 Illness, unspecified: Secondary | ICD-10-CM | POA: Diagnosis not present

## 2022-04-24 DIAGNOSIS — C3491 Malignant neoplasm of unspecified part of right bronchus or lung: Secondary | ICD-10-CM | POA: Diagnosis not present

## 2022-04-24 DIAGNOSIS — F17210 Nicotine dependence, cigarettes, uncomplicated: Secondary | ICD-10-CM | POA: Diagnosis not present

## 2022-04-24 DIAGNOSIS — Z5181 Encounter for therapeutic drug level monitoring: Secondary | ICD-10-CM | POA: Diagnosis not present

## 2022-04-24 NOTE — Progress Notes (Signed)
The proposed treatment discussed in cancer conference is for discussion purpose only and is not a binding recommendation. The patient was not physically examined nor present for their treatment options. Therefore, final treatment plans cannot be decided.  ?

## 2022-04-24 NOTE — Telephone Encounter (Signed)
CALLED PATIENT TO INFORM OF PET SCAN FOR 04-30-22- ARRIVAL TIME- 11 AM, PATIENT TO HAVE WATER ONLY- 6 HRS. PRIOR TO TEST, PATIENT TO RECEIVE RESULTS FROM [facility] BRUNING ON 05-01-22 @ 3 PM VIA TELEPHONE, SPOKE WITH PATIENT AND SHE IS AWARE OF THESE APPTS.

## 2022-04-25 ENCOUNTER — Ambulatory Visit (INDEPENDENT_AMBULATORY_CARE_PROVIDER_SITE_OTHER): Payer: Medicare HMO | Admitting: Acute Care

## 2022-04-25 ENCOUNTER — Encounter: Payer: Self-pay | Admitting: Acute Care

## 2022-04-25 ENCOUNTER — Encounter: Payer: Self-pay | Admitting: Radiation Oncology

## 2022-04-25 VITALS — BP 126/68 | HR 76 | Temp 98.5°F | Ht 61.0 in | Wt 193.0 lb

## 2022-04-25 DIAGNOSIS — C3431 Malignant neoplasm of lower lobe, right bronchus or lung: Secondary | ICD-10-CM

## 2022-04-25 DIAGNOSIS — Z720 Tobacco use: Secondary | ICD-10-CM | POA: Diagnosis not present

## 2022-04-25 NOTE — Progress Notes (Signed)
History of Present Illness Renee Maldonado is a 76 y.o. female with past medical history of right breast cancer with radiation, COPD, on Breo, history of hypertension, hyperlipidemia obesity.  Patient longstanding history of tobacco use since teenager. She has newly diagnosed NSCLC, adenocarcinoma of the right lower lobe lung, presumed to be Stage IA but PET scan pending to complete disease staging. .She underwent Flexible video fiberoptic bronchoscopy with robotic assistance and biopsies on 5/30/ 2023  per Dr. Valeta Harms . Biopsies were positive for malignant cells consistent with adenocarcinoma.    04/25/2022 Pt. Presents for follow up. She is here for follow up after her bronch and biopsy of a right lower lobe nodule on  5/30 2023. She underwent a Flexible video fiberoptic bronchoscopy with robotic assistance and biopsies by Dr. Valeta Harms.  She states she has done well after the procedure. .She did have 3 small dime sized clots the first few days post procedure that have cleared. She did have a sore throat and that has also self resolved. She does have some PND. She had this prior to the procedure.She is going to resume her Chlorpheniramine, and we will add Mucinex to thin out secretions and make them easier to cough it up  and Flonase .   She saw Dr. Tor Netters who has been managing her breast cancer. Her mammogram was normal. Her bone density was normal. She understands that she needs her PET scan to complete staging of her disease.  She has had her first appointment with  Dr.Manning regarding SBRT treatment to the RLL nodule, and possible to the RUL nodule.Marland Kitchen PET scan for final staging is scheduled for 04/30/2022.  She has follow up scheduled with radiation oncology 05/01/2022. They will review her PET scan and plan care based on results . No MRI brain now , but if PET scan shows anything but Stage 1 disease, it can be considered.  She has no complaints other than her PND. She has no respiratory symptoms. She is  a very positive person, and hopes her PET scan confirms stage 1 disease. She deies fever, chest pain, orthopnea of hemoptysis.   Test Results: CYTOLOGY - NON PAP  CASE: [id number]  [name]  Non-Gynecological Cytology Report  Clinical History: Lung nodules  FINAL MICROSCOPIC DIAGNOSIS:   A. LUNG, RLL, FINE NEEDLE ASPIRATION AND BIOPSIES:  - Malignant cells consistent with adenocarcinoma, see comment   B. LUNG, RUL, BRUSHING:  - Atypical cells present   C. LUNG, RUL, FINE NEEDLE ASPIRATION AND BIOPSIES:  - Atypical cells present        Latest Ref Rng & Units 04/15/2022    9:22 AM 11/25/2021    9:12 AM 09/23/2021    9:20 AM  CBC  WBC 4.0 - 10.5 K/uL 5.5  4.4  5.1   Hemoglobin 12.0 - 15.0 g/dL 12.9  12.3  12.9   Hematocrit 36.0 - 46.0 % 40.0  36.7  40.0   Platelets 150 - 400 K/uL 249  245  270        Latest Ref Rng & Units 04/15/2022    9:22 AM 11/25/2021    9:12 AM 09/23/2021    9:20 AM  BMP  Glucose 70 - 99 mg/dL 122  126  113   BUN 8 - 23 mg/dL 21  20  17    Creatinine 0.44 - 1.00 mg/dL 1.25  1.23  1.16   BUN/Creat Ratio 12 - 28  16  15    Sodium 135 - 145  mmol/L 139  145  142   Potassium 3.5 - 5.1 mmol/L 3.2  4.8  4.4   Chloride 98 - 111 mmol/L 104  104  101   CO2 22 - 32 mmol/L 26  28  29    Calcium 8.9 - 10.3 mg/dL 9.3  10.0  9.8     BNP    Component Value Date/Time   BNP 48.3 06/12/2020 0926    ProBNP No results found for: "PROBNP"  PFT No results found for: "FEV1PRE", "FEV1POST", "FVCPRE", "FVCPOST", "TLC", "DLCOUNC", "PREFEV1FVCRT", "PSTFEV1FVCRT"  DG Chest Port 1 View  Result Date: 04/15/2022 CLINICAL DATA:  Status post bronchoscopy EXAM: PORTABLE CHEST 1 VIEW COMPARISON:  03/03/2022 FINDINGS: The heart size and mediastinal contours are within normal limits. New fiduciary clips within the medial aspect of the right upper lobe. Mild bibasilar atelectasis. No pleural effusion or pneumothorax. The visualized skeletal structures are  unremarkable. IMPRESSION: Mild bibasilar atelectasis. No pneumothorax. Electronically Signed   By: Davina Poke D.O.   On: 04/15/2022 12:18   DG C-ARM BRONCHOSCOPY  Result Date: 04/15/2022 C-ARM BRONCHOSCOPY: Fluoroscopy was utilized by the requesting physician.  No radiographic interpretation.     Past medical hx Past Medical History:  Diagnosis Date   Anxiety    Arthritis    Cancer ([place])    breast Right no chemo or radiation  on Arimidex   COPD (chronic obstructive pulmonary disease) ([facility])    on Breo   GERD (gastroesophageal reflux disease)    Humerus fracture 2007   Hyperlipidemia    Hypertension    Insomnia    OA (osteoarthritis) of knee 10/08/2020     Social History   Tobacco Use   Smoking status: Former    Packs/day: 0.25    Years: 40.00    Total pack years: 10.00    Types: Cigarettes    Quit date: 09/17/2014    Years since quitting: 7.6   Smokeless tobacco: Never   Tobacco comments:    smokes 3 a day  Vaping Use   Vaping Use: Former   Start date: 07/18/2018   Quit date: 07/19/2019  Substance Use Topics   Alcohol use: Yes    Alcohol/week: 0.0 standard drinks of alcohol    Comment: rarely-     Drug use: No    Ms.Haran reports that she quit smoking about 7 years ago. Her smoking use included cigarettes. She has a 10.00 pack-year smoking history. She has never used smokeless tobacco. She reports current alcohol use. She reports that she does not use drugs.  Tobacco Cessation: Counseling given: Not Answered Tobacco comments: smokes 3 a day Smoking cessation counseling has been done   Past surgical hx, Family hx, Social hx all reviewed.  Current Outpatient Medications on File Prior to Visit  Medication Sig   acetaminophen (TYLENOL) 500 MG tablet Take 500 mg by mouth every 6 (six) hours as needed for moderate pain.    albuterol (VENTOLIN HFA) 108 (90 Base) MCG/ACT inhaler 2 puffs in lungs every 6 hours as needed for wheezing/short of breath.   amLODipine  (NORVASC) 5 MG tablet TAKE 1 TABLET DAILY   anastrozole (ARIMIDEX) 1 MG tablet Take 1 mg by mouth daily.    aspirin EC 325 MG tablet Take 650 mg by mouth daily as needed for moderate pain.   budesonide-formoterol (SYMBICORT) 160-4.5 MCG/ACT inhaler Inhale 2 puffs into the lungs 2 (two) times daily.   Calcium Carb-Cholecalciferol 600-20 MG-MCG TABS Take 1 tablet by mouth daily.  chlorpheniramine (CHLOR-TRIMETON) 4 MG tablet Take 4 mg by mouth in the morning and at bedtime.    cholecalciferol (VITAMIN D3) 25 MCG (1000 UNIT) tablet Take 1,000 Units by mouth daily.   clindamycin (CLEOCIN) 300 MG capsule Take 300 mg by mouth See admin instructions. Take 600 mg by mouth one hour prior to dental appointment.   cyanocobalamin (,VITAMIN B-12,) 1000 MCG/ML injection Inject 1 mL (1,000 mcg total) into the skin every 30 (thirty) days.   famotidine (PEPCID) 40 MG tablet Take 1 tablet (40 mg total) by mouth daily.   hydrochlorothiazide (HYDRODIURIL) 25 MG tablet Take 1 tablet (25 mg total) by mouth daily.   hydrOXYzine (ATARAX/VISTARIL) 25 MG tablet Take 25 mg by mouth every 8 (eight) hours as needed for itching.   raloxifene (EVISTA) 60 MG tablet TAKE ONE TABLET ONCE DAILY   RESTASIS 0.05 % ophthalmic emulsion Place 1 drop into both eyes 2 (two) times daily as needed (dry eyes).    rosuvastatin (CRESTOR) 20 MG tablet TAKE 1 TABLET DAILY   traZODone (DESYREL) 100 MG tablet Take 1 tablet (100 mg total) by mouth at bedtime.   valACYclovir (VALTREX) 500 MG tablet TAKE (1) TABLET DAILY AS NEEDED.   Current Facility-Administered Medications on File Prior to Visit  Medication   cyanocobalamin ((VITAMIN B-12)) injection 1,000 mcg     Allergies  Allergen Reactions   Benadryl [Diphenhydramine Hcl] Nausea Only    Dizziness   Penicillins     Had a rash around 20 years ago but unsure if it come from medicine.   Tolerated Cephalosporin Date: 10/09/20.      Review Of Systems:  Constitutional:   No  weight  loss, night sweats,  Fevers, chills, fatigue, or  lassitude.  HEENT:   No headaches,  Difficulty swallowing,  Tooth/dental problems, or  Sore throat,                No sneezing, itching, ear ache, ++ nasal congestion,++  post nasal drip,   CV:  No chest pain,  Orthopnea, PND, swelling in lower extremities, anasarca, dizziness, palpitations, syncope.   GI  No heartburn, indigestion, abdominal pain, nausea, vomiting, diarrhea, change in bowel habits, loss of appetite, bloody stools.   Resp: No shortness of breath with exertion or at rest.  No excess mucus, no productive cough,  No non-productive cough,  No coughing up of blood.  No change in color of mucus.  No wheezing.  No chest wall deformity  Skin: no rash or lesions.  GU: no dysuria, change in color of urine, no urgency or frequency.  No flank pain, no hematuria   MS:  No joint pain or swelling.  No decreased range of motion.  No back pain.  Psych:  No change in mood or affect. No depression or anxiety.  No memory loss.   Vital Signs BP 126/68 (BP Location: Left Arm, Patient Position: Sitting, Cuff Size: Normal)   Pulse 76   Temp 98.5 F (36.9 C) (Oral)   Ht 5\' 1"  (1.549 m)   Wt 193 lb (87.5 kg)   SpO2 97%   BMI 36.47 kg/m    Physical Exam:  General- No distress,  A&Ox3, very pleasant female , here with her husband ENT: No sinus tenderness, TM clear, pale nasal mucosa, no oral exudate,no post nasal drip, no LAN Cardiac: S1, S2, regular rate and rhythm, no murmur Chest: No wheeze/ rales/ dullness; no accessory muscle use, no nasal flaring, no sternal retractions, diminished per bases  Abd.: Soft Non-tender, ND, BS +, Body mass index is 36.47 kg/m.  Ext: No clubbing cyanosis, edema, no obvious deformities of joints Neuro:  normal strength, MAE x 4, A&O x 3 Skin: No rashes, warm and dry, no lesions Psych: normal mood and behavior   Assessment/Plan Newly diagnosed NSCLC, adenocarcinoma of the right lower lobe lung,  presumed to be Stage IA but PET scan pending to complete disease staging. Plan Your PET scan is 04/30/2022, this will help confirm Stage 1 disease.  I have messaged Dr.Manning to see if he would like me to order an MRI brain.  He does not feel you need this at this time.  Follow up at Radiation oncology 05/01/2022 , or re-schedule as we discussed if you have a conflict.  Emmit Alexanders with your treatment Follow up with [place] as needed.  Please contact office for sooner follow up if symptoms do not improve or worsen or seek emergency care    Post Nasal Drip/ Seasonal Allergies Plan Continue Chlorpheniramine 4 mg in the morning and in the evening.  Add Flonase ( Fluticasone) , one spray into each nostril once daily Add Mucinex 600  mg in the morning to help thin out your secretions. Follow up as needed. Please contact office for sooner follow up if symptoms do not improve or worsen or seek emergency care    Tobacco Use Working hard to quit >> smokes 3 cigarettes a day.  Plan Please work on quitting smoking entirely. Call 1-800 [place] for free nicotine therapy Let us know how we can help  I spent 40 minutes dedicated to the care of this patient on the date of this encounter to include pre-visit review of records, face-to-face time with the patient discussing conditions above, post visit ordering of testing, clinical documentation with the electronic health record, making appropriate referrals as documented, and communicating necessary information to the patient's healthcare team.    Magdalen Spatz, NP 04/25/2022  11:10 AM

## 2022-04-25 NOTE — Patient Instructions (Addendum)
It is good to see you today. Dr. Tammi Klippel will take great care of you.  Your PET scan is 04/30/2022, this will help confirm Stage 1 disease.  I have messaged Dr.Manning to see if he would like me to order an MRI brain.  He does not feel you need this at this time.  Continue Chlorpheniramine 4 mg in the morning and in the evening.  Add Flonase ( Fluticasone) , one spray into each nostril once daily Add Mucinex 600  mg in the morning to help thin out your secretions. Follow up as needed. Please contact office for sooner follow up if symptoms do not improve or worsen or seek emergency care

## 2022-04-26 DIAGNOSIS — C3431 Malignant neoplasm of lower lobe, right bronchus or lung: Secondary | ICD-10-CM | POA: Diagnosis not present

## 2022-04-26 DIAGNOSIS — Z87891 Personal history of nicotine dependence: Secondary | ICD-10-CM | POA: Diagnosis not present

## 2022-04-28 ENCOUNTER — Ambulatory Visit (INDEPENDENT_AMBULATORY_CARE_PROVIDER_SITE_OTHER): Payer: Medicare HMO | Admitting: *Deleted

## 2022-04-28 DIAGNOSIS — E538 Deficiency of other specified B group vitamins: Secondary | ICD-10-CM | POA: Diagnosis not present

## 2022-04-30 ENCOUNTER — Telehealth: Payer: Self-pay

## 2022-04-30 ENCOUNTER — Encounter (HOSPITAL_COMMUNITY)
Admission: RE | Admit: 2022-04-30 | Discharge: 2022-04-30 | Disposition: A | Discharge status: Home / Self Care | Payer: Medicare HMO | Source: Ambulatory Visit | Attending: Urology | Admitting: Urology

## 2022-04-30 DIAGNOSIS — I2510 Atherosclerotic heart disease of native coronary artery without angina pectoris: Secondary | ICD-10-CM | POA: Diagnosis not present

## 2022-04-30 DIAGNOSIS — D259 Leiomyoma of uterus, unspecified: Secondary | ICD-10-CM | POA: Diagnosis not present

## 2022-04-30 DIAGNOSIS — C3490 Malignant neoplasm of unspecified part of unspecified bronchus or lung: Secondary | ICD-10-CM | POA: Diagnosis not present

## 2022-04-30 DIAGNOSIS — N281 Cyst of kidney, acquired: Secondary | ICD-10-CM | POA: Diagnosis not present

## 2022-04-30 DIAGNOSIS — C3491 Malignant neoplasm of unspecified part of right bronchus or lung: Secondary | ICD-10-CM | POA: Diagnosis not present

## 2022-04-30 LAB — GLUCOSE, CAPILLARY: Glucose-Capillary: 128 mg/dL — ABNORMAL HIGH (ref 70–99)

## 2022-04-30 MED ORDER — FLUDEOXYGLUCOSE F - 18 (FDG) INJECTION
9.6300 | Freq: Once | INTRAVENOUS | Status: AC
  Administered 2022-04-30: 9.63 via INTRAVENOUS

## 2022-04-30 NOTE — Telephone Encounter (Signed)
Patient would like to cancel her 3:00pm-05/01/22 telephone appointment w/ Ashlyn Bruning PA-C, due to a personal scheduling conflict. I told patient that the scheduling department will call her to schedule a new time/date. Patient verbalized understanding.   Patient contact [phone]

## 2022-05-01 ENCOUNTER — Ambulatory Visit: Admission: RE | Admit: 2022-05-01 | Payer: Medicare HMO | Source: Ambulatory Visit | Admitting: Urology

## 2022-05-01 ENCOUNTER — Telehealth: Payer: Self-pay | Admitting: *Deleted

## 2022-05-01 NOTE — Telephone Encounter (Signed)
CALLED PATIENT TO ASK ABOUT RESCHEDULING TELEPHONE FU FOR 05-01-22 DUE TO THE PATIENT BEING UNAVAILABLE TODAY, LVM FOR A RETURN CALL

## 2022-05-02 ENCOUNTER — Encounter: Payer: Self-pay | Admitting: Urology

## 2022-05-02 ENCOUNTER — Inpatient Hospital Stay
Admission: RE | Admit: 2022-05-02 | Discharge: 2022-05-02 | Disposition: A | Discharge status: Home / Self Care | Payer: Medicare HMO | Source: Ambulatory Visit | Attending: Urology | Admitting: Urology

## 2022-05-02 DIAGNOSIS — C3431 Malignant neoplasm of lower lobe, right bronchus or lung: Secondary | ICD-10-CM | POA: Diagnosis not present

## 2022-05-02 DIAGNOSIS — Z87891 Personal history of nicotine dependence: Secondary | ICD-10-CM | POA: Diagnosis not present

## 2022-05-02 NOTE — Progress Notes (Signed)
Radiation Oncology         [phone] ________________________________  Outpatient follow up -Conducted via telephone   Name: Renee Maldonado MRN: [id number]  Date of Service: 05/02/2022 DOB: [birth year]  IW:[id number], Cletus Gash, MD  Garner Nash, DO   REFERRING PHYSICIAN: Garner Nash, DO  DIAGNOSIS: 76 year old female with newly diagnosed NSCLC, adenocarcinoma of the right lower lobe lung, Stage IA.    ICD-10-CM   1. 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)  C34.31       HISTORY OF PRESENT ILLNESS: Renee Maldonado is a 76 y.o. female seen at the request of Dr. Valeta Harms.  She is a current smoker with a history of breast cancer, previously treated with lumpectomy only, did not require adjuvant radiation, and a newly diagnosed papillary thyroid cancer.  Her medical oncologist for her breast cancer is Dr. Tor Netters at [facility].  She had a recent fall at home in February 2023 so her PCP ordered CT neck and head imaging for further evaluation.  On the CT neck imaging, she was incidentally noted to have a 1.1 x 0.8 x 0.9 cm nodule in the right upper lung apex as well as a 2.6 cm peritracheal nodule felt to be an exophytic thyroid nodule.  She had thyroid ultrasound and biopsy on 03/13/2022 with pathology confirming papillary carcinoma.  She has a consult visit scheduled with Dr. Kathlene Cote at [facility] on 10/02/2022 to discuss management of the thyroid carcinoma.  A follow-up chest CT on 03/03/2022, for further evaluation of the lung nodule confirmed a subsolid nodule in the right lung apex as well as a 1.4 cm groundglass nodule in the right lower lobe lung, suspicious for indolent bronchogenic malignancy.  The thyroid nodule was again noted and unchanged in size or appearance.  She met with Dr. Valeta Harms on 03/26/2022 and subsequently underwent robotic assisted bronchoscopy with biopsy sampling of the right upper lobe and right lower lobe lung lesions on  04/15/2022.  Final surgical pathology confirmed a NSCLC, adenocarcinoma in the right lower lobe lung lesion.  Samples from the right upper lobe lung lesion returned atypical cells only, no definite malignancy.  She had recent PET imaging to complete her disease staging on 04/30/2022 and this confirmed two hypermetabolic thyroid lesions but no activity in the RUL or RLL lung lesions or enlarged or hypermetabolic mediastinal or hilar lymph nodes and no findings for metastatic disease involving the abdomen/pelvis or bony structures.  We reviewed these findings today.  PREVIOUS RADIATION THERAPY: No  PAST MEDICAL HISTORY:  Past Medical History:  Diagnosis Date   Anxiety    Arthritis    Cancer ([place])    breast Right no chemo or radiation  on Arimidex   COPD (chronic obstructive pulmonary disease) ([place])    on Breo   GERD (gastroesophageal reflux disease)    Humerus fracture 2007   Hyperlipidemia    Hypertension    Insomnia    OA (osteoarthritis) of knee 10/08/2020      PAST SURGICAL HISTORY: Past Surgical History:  Procedure Laterality Date   BREAST LUMPECTOMY Right 02/2019   BREAST SURGERY Right 01/2019   breast biopsy   BRONCHIAL BIOPSY  04/15/2022   Procedure: BRONCHIAL BIOPSIES;  Surgeon: Garner Nash, DO;  Location: [facility];  Service: Pulmonary;;   BRONCHIAL BRUSHINGS  04/15/2022   Procedure: BRONCHIAL BRUSHINGS;  Surgeon: Garner Nash, DO;  Location: [facility];  Service: Pulmonary;;   BRONCHIAL NEEDLE ASPIRATION  BIOPSY  04/15/2022   Procedure: BRONCHIAL NEEDLE ASPIRATION BIOPSIES;  Surgeon: Garner Nash, DO;  Location: [facility];  Service: Pulmonary;;   FIDUCIAL MARKER PLACEMENT  04/15/2022   Procedure: FIDUCIAL MARKER PLACEMENT;  Surgeon: Garner Nash, DO;  Location: [facility];  Service: Pulmonary;;   JOINT REPLACEMENT     left knee   Left shoulder surgery     2007   TOTAL KNEE ARTHROPLASTY     2012 left   TOTAL KNEE ARTHROPLASTY Right 10/08/2020    Procedure: TOTAL KNEE ARTHROPLASTY;  Surgeon: Gaynelle Arabian, MD;  Location: [facility];  Service: Orthopedics;  Laterality: Right;   TUBAL LIGATION     VIDEO BRONCHOSCOPY WITH RADIAL ENDOBRONCHIAL ULTRASOUND  04/15/2022   Procedure: VIDEO BRONCHOSCOPY WITH RADIAL ENDOBRONCHIAL ULTRASOUND;  Surgeon: Garner Nash, DO;  Location: [facility];  Service: Pulmonary;;    FAMILY HISTORY:  Family History  Problem Relation Age of Onset   Alzheimer's disease Mother    Diabetes Mother    Stroke Father    Hypertension Father    Cancer Brother     SOCIAL HISTORY:  Social History   Socioeconomic History   Marital status: Married    Spouse name: Jori Moll   Number of children: 3   Years of education: 16   Highest education level: Bachelor's degree (e.g., BA, AB, BS)  Occupational History   Occupation: retired  Tobacco Use   Smoking status: Former    Packs/day: 0.25    Years: 40.00    Total pack years: 10.00    Types: Cigarettes    Quit date: 09/17/2014    Years since quitting: 7.6   Smokeless tobacco: Never   Tobacco comments:    smokes 3 a day  Vaping Use   Vaping Use: Former   Start date: 07/18/2018   Quit date: 07/19/2019  Substance and Sexual Activity   Alcohol use: Yes    Alcohol/week: 0.0 standard drinks of alcohol    Comment: rarely-     Drug use: No   Sexual activity: Not Currently  Other Topics Concern   Not on file  Social History Narrative   Lives home with her husband. Enjoys travelling, playing bingo, exercising at [employer], eats out a lot   Social Determinants of Health   Financial Resource Strain: Low Risk  (08/02/2021)   Overall Financial Resource Strain (CARDIA)    Difficulty of Paying Living Expenses: Not hard at all  Food Insecurity: No Food Insecurity (08/02/2021)   Hunger Vital Sign    Worried About Running Out of Food in the Last Year: Never true    Ran Out of Food in the Last Year: Never true  Transportation Needs: No Transportation Needs  (08/02/2021)   PRAPARE - Hydrologist (Medical): No    Lack of Transportation (Non-Medical): No  Physical Activity: Insufficiently Active (08/02/2021)   Exercise Vital Sign    Days of Exercise per Week: 4 days    Minutes of Exercise per Session: 30 min  Stress: No Stress Concern Present (08/02/2021)   [place]    Feeling of Stress : Not at all  Social Connections: [place] (08/02/2021)   Social Connection and Isolation Panel [NHANES]    Frequency of Communication with Friends and Family: More than three times a week    Frequency of Social Gatherings with Friends and Family: More than three times a week    Attends Religious  Services: More than 4 times per year    Active Member of Clubs or Organizations: Yes    Attends Archivist Meetings: More than 4 times per year    Marital Status: Married  Human resources officer Violence: Not At Risk (08/02/2021)   Humiliation, Afraid, Rape, and Kick questionnaire    Fear of Current or Ex-Partner: No    Emotionally Abused: No    Physically Abused: No    Sexually Abused: No    ALLERGIES: Benadryl [diphenhydramine hcl] and Penicillins  MEDICATIONS:  Current Outpatient Medications  Medication Sig Dispense Refill   acetaminophen (TYLENOL) 500 MG tablet Take 500 mg by mouth every 6 (six) hours as needed for moderate pain.      albuterol (VENTOLIN HFA) 108 (90 Base) MCG/ACT inhaler 2 puffs in lungs every 6 hours as needed for wheezing/short of breath. 8.5 g 5   amLODipine (NORVASC) 5 MG tablet TAKE 1 TABLET DAILY 90 tablet 1   anastrozole (ARIMIDEX) 1 MG tablet Take 1 mg by mouth daily.      aspirin EC 325 MG tablet Take 650 mg by mouth daily as needed for moderate pain.     budesonide-formoterol (SYMBICORT) 160-4.5 MCG/ACT inhaler Inhale 2 puffs into the lungs 2 (two) times daily. 3 each 3   Calcium Carb-Cholecalciferol 600-20 MG-MCG TABS Take  1 tablet by mouth daily.     chlorpheniramine (CHLOR-TRIMETON) 4 MG tablet Take 4 mg by mouth in the morning and at bedtime.      cholecalciferol (VITAMIN D3) 25 MCG (1000 UNIT) tablet Take 1,000 Units by mouth daily.     clindamycin (CLEOCIN) 300 MG capsule Take 300 mg by mouth See admin instructions. Take 600 mg by mouth one hour prior to dental appointment.     cyanocobalamin (,VITAMIN B-12,) 1000 MCG/ML injection Inject 1 mL (1,000 mcg total) into the skin every 30 (thirty) days. 1 mL 11   famotidine (PEPCID) 40 MG tablet Take 1 tablet (40 mg total) by mouth daily. 90 tablet 2   hydrochlorothiazide (HYDRODIURIL) 25 MG tablet Take 1 tablet (25 mg total) by mouth daily. 90 tablet 3   hydrOXYzine (ATARAX/VISTARIL) 25 MG tablet Take 25 mg by mouth every 8 (eight) hours as needed for itching.     raloxifene (EVISTA) 60 MG tablet TAKE ONE TABLET ONCE DAILY 90 tablet 0   RESTASIS 0.05 % ophthalmic emulsion Place 1 drop into both eyes 2 (two) times daily as needed (dry eyes).      rosuvastatin (CRESTOR) 20 MG tablet TAKE 1 TABLET DAILY 90 tablet 0   traZODone (DESYREL) 100 MG tablet Take 1 tablet (100 mg total) by mouth at bedtime. 90 tablet 3   valACYclovir (VALTREX) 500 MG tablet TAKE (1) TABLET DAILY AS NEEDED. 30 tablet 2   Current Facility-Administered Medications  Medication Dose Route Frequency Provider Last Rate Last Admin   cyanocobalamin ((VITAMIN B-12)) injection 1,000 mcg  1,000 mcg Intramuscular Q30 days Claretta Fraise, MD   1,000 mcg at 04/28/22 6808    REVIEW OF SYSTEMS:  On review of systems, the patient reports that she is doing well overall.  She denies any chest pain, shortness of breath, cough, fevers, chills, night sweats, unintended weight changes.  She denies any bowel or bladder disturbances, and denies abdominal pain, nausea or vomiting.  She denies any new musculoskeletal or joint aches or pains. A complete review of systems is obtained and is otherwise negative.     PHYSICAL EXAM:  Wt Readings from Last  3 Encounters:  04/25/22 193 lb (87.5 kg)  04/23/22 195 lb 3.2 oz (88.5 kg)  04/15/22 195 lb (88.5 kg)   Temp Readings from Last 3 Encounters:  04/25/22 98.5 F (36.9 C) (Oral)  04/23/22 (!) 97.5 F (36.4 C) (Oral)  04/15/22 98.7 F (37.1 C)   BP Readings from Last 3 Encounters:  04/25/22 126/68  04/23/22 (!) 148/67  04/15/22 (!) 146/73   Pulse Readings from Last 3 Encounters:  04/25/22 76  04/23/22 78  04/15/22 84   Pain Assessment Pain Score: 0-No pain/10  Unable to assess due to telephone follow-up visit format.  KPS = 90  100 - Normal; no complaints; no evidence of disease. 90   - Able to carry on normal activity; minor signs or symptoms of disease. 80   - Normal activity with effort; some signs or symptoms of disease. 50   - Cares for self; unable to carry on normal activity or to do active work. 60   - Requires occasional assistance, but is able to care for most of his personal needs. 50   - Requires considerable assistance and frequent medical care. 20   - Disabled; requires special care and assistance. 42   - Severely disabled; hospital admission is indicated although death not imminent. 12   - Very sick; hospital admission necessary; active supportive treatment necessary. 10   - Moribund; fatal processes progressing rapidly. 0     - Dead  Karnofsky DA, Abelmann [place], Craver LS and Burchenal JH [phone]) The use of the nitrogen mustards in the palliative treatment of carcinoma: with particular reference to bronchogenic carcinoma Cancer 1 634-56  LABORATORY DATA:  Lab Results  Component Value Date   WBC 5.5 04/15/2022   HGB 12.9 04/15/2022   HCT 40.0 04/15/2022   MCV 83.0 04/15/2022   PLT 249 04/15/2022   Lab Results  Component Value Date   NA 139 04/15/2022   K 3.2 (L) 04/15/2022   CL 104 04/15/2022   CO2 26 04/15/2022   Lab Results  Component Value Date   ALT 16 11/25/2021   AST 19 11/25/2021   ALKPHOS 87  11/25/2021   BILITOT 0.3 11/25/2021     RADIOGRAPHY: NM PET Image Initial (PI) Skull Base To Thigh  Result Date: 05/02/2022 CLINICAL DATA:  Initial treatment strategy for non-small cell lung cancer. EXAM: NUCLEAR MEDICINE PET SKULL BASE TO THIGH TECHNIQUE: 9.63 mCi F-18 FDG was injected intravenously. Full-ring PET imaging was performed from the skull base to thigh after the radiotracer. CT data was obtained and used for attenuation correction and anatomic localization. Fasting blood glucose: 128 mg/dl COMPARISON:  Chest CT 03/03/2022 FINDINGS: Mediastinal blood pool activity: SUV max 3.26 Liver activity: SUV max NA NECK: Markedly hypermetabolic right thyroid lesion has an SUV max of 27.78. There is also a second soft tissue mass likely thyroid tissue in the upper mediastinum adjacent to the upper esophagus. This measures 17 mm and the SUV max is 6.0. Recommend correlation with recent thyroid biopsy. No cervical lymphadenopathy or neck mass. Incidental CT findings: Mucous retention cyst or polyp noted in the right maxillary sinus. CHEST: No enlarged or hypermetabolic mediastinal hilar lymph nodes. The right upper lobe subsolid density that was recently biopsied does not show any hypermetabolism. SUV max is 1.40. The nearby fiducial is noted. The mixed solid and ground-glass nodule in the right lower lobe does not show any hypermetabolism. SUV max is 1.12. No new pulmonary lesions. No hypermetabolic breast masses, supraclavicular or axillary  adenopathy. Incidental CT findings: Stable aortic and scattered coronary artery calcifications. ABDOMEN/PELVIS: No abnormal hypermetabolic activity within the liver, pancreas, adrenal glands, or spleen. No hypermetabolic lymph nodes in the abdomen or pelvis. Incidental CT findings: Moderate atherosclerotic calcification involving the abdominal aorta but no aneurysm. Right renal cyst is noted. Small calcified uterine fibroids. SKELETON: No focal hypermetabolic activity to  suggest skeletal metastasis. Incidental CT findings: none IMPRESSION: 1. Right upper lobe and right lower lobe next sub solid nodules do not show any hypermetabolism. 2. No enlarged or hypermetabolic mediastinal or hilar lymph nodes. 3. No findings for metastatic disease involving the abdomen/pelvis or bony structures. 4. 2 hypermetabolic thyroid lesions as detailed above. Recommend correlation with recent biopsy. Electronically Signed   By: Marijo Sanes M.D.   On: 05/02/2022 08:24   DG Chest Port 1 View  Result Date: 04/15/2022 CLINICAL DATA:  Status post bronchoscopy EXAM: PORTABLE CHEST 1 VIEW COMPARISON:  03/03/2022 FINDINGS: The heart size and mediastinal contours are within normal limits. New fiduciary clips within the medial aspect of the right upper lobe. Mild bibasilar atelectasis. No pleural effusion or pneumothorax. The visualized skeletal structures are unremarkable. IMPRESSION: Mild bibasilar atelectasis. No pneumothorax. Electronically Signed   By: Davina Poke D.O.   On: 04/15/2022 12:18   DG C-ARM BRONCHOSCOPY  Result Date: 04/15/2022 C-ARM BRONCHOSCOPY: Fluoroscopy was utilized by the requesting physician.  No radiographic interpretation.      IMPRESSION/PLAN: 56. 76 y.o. female with newly diagnosed NSCLC, adenocarcinoma of the right lower lobe lung, Stage IA. Today, I talked to the patient about the findings and workup thus far. We reviewed her recent PET scan results which confirm Stage IA disease.   We reviewed the available radiation techniques, and focused on the details and logistics of delivery.  The recommendation is for a 3 fraction course of SBRT to the RLL lung nodule.  We reviewed the anticipated acute and late sequelae associated with radiation in this setting. The patient was encouraged to ask questions that were answered to her stated satisfaction.  At the conclusion of our conversation, the patient is interested in proceeding with the recommended 3 fraction course  of SBRT to the RLL lesion.  We will continue to closely monitor the lesion in the RUL since this was not hot on PET and pathology showed atypical cells only, no definite malignancy. She appears to have a good understanding of her disease and our recommendations and has provided verbal consent to proceed today. We will share our discussion with Dr. Valeta Harms and proceed with treatment planning, with CT simulation next week, in anticipation of beginning her treatments in the very near future.  We enjoyed meeting with her again today and look forward to continuing to participate in her care.  This encounter was conducted via telephone. The patient was notified in advance and has given verbal consent for this type of encounter. The attendants for this meeting include Brennen Camper PA-C, and patient, Legna Mausolf. During the encounter, Shariece Viveiros PA-C, was located at [facility] Radiation Oncology Department.  Patient, Jackelynn Hosie was located at home.   I personally spent 25 minutes in this encounter including chart review, reviewing radiological studies, telephone conversation with the patient, entering orders and completing documentation.   Nicholos Johns, MMS, PA-C [place] [facility] [place]: [phone]  Fax: [phone]

## 2022-05-02 NOTE — Progress Notes (Signed)
Telephone appointment. I verified patient's identity and began nursing interview. Patient reports being under a lot of stress at the moment 5/10. No other issues reported at this time.  Meaningful use complete.  Reminded patient of her 1:30pm-05/02/22 telephone appointment w/ Ashlyn Bruning PA-C. I left my extension [phone] in case patient needs anything. Patient verbalized understanding.  Patient contact [phone]

## 2022-05-05 ENCOUNTER — Ambulatory Visit: Payer: Self-pay | Admitting: Urology

## 2022-05-05 ENCOUNTER — Other Ambulatory Visit: Payer: Self-pay | Admitting: *Deleted

## 2022-05-05 ENCOUNTER — Telehealth: Payer: Self-pay | Admitting: Family Medicine

## 2022-05-05 DIAGNOSIS — I10 Essential (primary) hypertension: Secondary | ICD-10-CM

## 2022-05-05 DIAGNOSIS — E782 Mixed hyperlipidemia: Secondary | ICD-10-CM

## 2022-05-05 NOTE — Telephone Encounter (Signed)
Labs ordered.

## 2022-05-09 ENCOUNTER — Ambulatory Visit
Admission: RE | Admit: 2022-05-09 | Discharge: 2022-05-09 | Disposition: A | Discharge status: Home / Self Care | Payer: Medicare HMO | Source: Ambulatory Visit | Attending: Radiation Oncology | Admitting: Radiation Oncology

## 2022-05-09 ENCOUNTER — Other Ambulatory Visit: Payer: Self-pay

## 2022-05-09 DIAGNOSIS — Z87891 Personal history of nicotine dependence: Secondary | ICD-10-CM | POA: Insufficient documentation

## 2022-05-09 DIAGNOSIS — C3431 Malignant neoplasm of lower lobe, right bronchus or lung: Secondary | ICD-10-CM | POA: Insufficient documentation

## 2022-05-09 DIAGNOSIS — Z510 Encounter for antineoplastic radiation therapy: Secondary | ICD-10-CM | POA: Insufficient documentation

## 2022-05-10 ENCOUNTER — Other Ambulatory Visit: Payer: Self-pay | Admitting: Family Medicine

## 2022-05-26 ENCOUNTER — Ambulatory Visit: Payer: Medicare HMO | Admitting: Family Medicine

## 2022-05-28 ENCOUNTER — Ambulatory Visit: Payer: Medicare HMO

## 2022-05-29 DIAGNOSIS — Z510 Encounter for antineoplastic radiation therapy: Secondary | ICD-10-CM | POA: Insufficient documentation

## 2022-05-29 DIAGNOSIS — R918 Other nonspecific abnormal finding of lung field: Secondary | ICD-10-CM | POA: Insufficient documentation

## 2022-05-29 DIAGNOSIS — C3431 Malignant neoplasm of lower lobe, right bronchus or lung: Secondary | ICD-10-CM | POA: Diagnosis not present

## 2022-05-29 DIAGNOSIS — Z87891 Personal history of nicotine dependence: Secondary | ICD-10-CM | POA: Diagnosis not present

## 2022-05-30 ENCOUNTER — Ambulatory Visit (INDEPENDENT_AMBULATORY_CARE_PROVIDER_SITE_OTHER): Payer: Medicare HMO | Admitting: *Deleted

## 2022-05-30 DIAGNOSIS — E538 Deficiency of other specified B group vitamins: Secondary | ICD-10-CM

## 2022-05-30 NOTE — Progress Notes (Signed)
B12 injection given today left deltoid, pt tolerated well

## 2022-06-02 ENCOUNTER — Other Ambulatory Visit: Payer: Self-pay

## 2022-06-02 ENCOUNTER — Ambulatory Visit
Admission: RE | Admit: 2022-06-02 | Discharge: 2022-06-02 | Disposition: A | Discharge status: Home / Self Care | Payer: Medicare HMO | Source: Ambulatory Visit | Attending: Radiation Oncology | Admitting: Radiation Oncology

## 2022-06-02 DIAGNOSIS — R918 Other nonspecific abnormal finding of lung field: Secondary | ICD-10-CM | POA: Diagnosis not present

## 2022-06-02 DIAGNOSIS — Z510 Encounter for antineoplastic radiation therapy: Secondary | ICD-10-CM | POA: Diagnosis not present

## 2022-06-02 DIAGNOSIS — C3431 Malignant neoplasm of lower lobe, right bronchus or lung: Secondary | ICD-10-CM

## 2022-06-02 LAB — RAD ONC ARIA SESSION SUMMARY
Course Elapsed Days: 0
Plan Fractions Treated to Date: 1
Plan Prescribed Dose Per Fraction: 18 Gy
Plan Total Fractions Prescribed: 3
Plan Total Prescribed Dose: 54 Gy
Reference Point Dosage Given to Date: 18 Gy
Reference Point Session Dosage Given: 18 Gy
Session Number: 1

## 2022-06-04 ENCOUNTER — Other Ambulatory Visit: Payer: Self-pay

## 2022-06-04 ENCOUNTER — Ambulatory Visit
Admission: RE | Admit: 2022-06-04 | Discharge: 2022-06-04 | Disposition: A | Discharge status: Home / Self Care | Payer: Medicare HMO | Source: Ambulatory Visit | Attending: Radiation Oncology | Admitting: Radiation Oncology

## 2022-06-04 DIAGNOSIS — Z510 Encounter for antineoplastic radiation therapy: Secondary | ICD-10-CM | POA: Diagnosis not present

## 2022-06-04 DIAGNOSIS — R918 Other nonspecific abnormal finding of lung field: Secondary | ICD-10-CM | POA: Diagnosis not present

## 2022-06-04 LAB — RAD ONC ARIA SESSION SUMMARY
Course Elapsed Days: 2
Plan Fractions Treated to Date: 2
Plan Prescribed Dose Per Fraction: 18 Gy
Plan Total Fractions Prescribed: 3
Plan Total Prescribed Dose: 54 Gy
Reference Point Dosage Given to Date: 36 Gy
Reference Point Session Dosage Given: 18 Gy
Session Number: 2

## 2022-06-09 ENCOUNTER — Encounter: Payer: Self-pay | Admitting: Urology

## 2022-06-09 ENCOUNTER — Ambulatory Visit
Admission: RE | Admit: 2022-06-09 | Discharge: 2022-06-09 | Disposition: A | Discharge status: Home / Self Care | Payer: Medicare HMO | Source: Ambulatory Visit | Attending: Radiation Oncology | Admitting: Radiation Oncology

## 2022-06-09 ENCOUNTER — Other Ambulatory Visit: Payer: Self-pay

## 2022-06-09 ENCOUNTER — Ambulatory Visit: Payer: Medicare HMO

## 2022-06-09 DIAGNOSIS — Z510 Encounter for antineoplastic radiation therapy: Secondary | ICD-10-CM | POA: Diagnosis not present

## 2022-06-09 DIAGNOSIS — C3431 Malignant neoplasm of lower lobe, right bronchus or lung: Secondary | ICD-10-CM

## 2022-06-09 DIAGNOSIS — Z87891 Personal history of nicotine dependence: Secondary | ICD-10-CM | POA: Diagnosis not present

## 2022-06-09 DIAGNOSIS — R918 Other nonspecific abnormal finding of lung field: Secondary | ICD-10-CM | POA: Diagnosis not present

## 2022-06-09 LAB — RAD ONC ARIA SESSION SUMMARY
Course Elapsed Days: 7
Plan Fractions Treated to Date: 3
Plan Prescribed Dose Per Fraction: 18 Gy
Plan Total Fractions Prescribed: 3
Plan Total Prescribed Dose: 54 Gy
Reference Point Dosage Given to Date: 54 Gy
Reference Point Session Dosage Given: 18 Gy
Session Number: 3

## 2022-07-01 ENCOUNTER — Ambulatory Visit: Payer: Medicare HMO | Admitting: Family Medicine

## 2022-07-02 ENCOUNTER — Ambulatory Visit: Payer: Medicare HMO

## 2022-07-04 ENCOUNTER — Ambulatory Visit (INDEPENDENT_AMBULATORY_CARE_PROVIDER_SITE_OTHER): Payer: Medicare HMO

## 2022-07-04 DIAGNOSIS — E538 Deficiency of other specified B group vitamins: Secondary | ICD-10-CM | POA: Diagnosis not present

## 2022-07-04 NOTE — Progress Notes (Signed)
Cyanocobalamin injection given to right deltoid.  Patient tolerated well. 

## 2022-07-10 ENCOUNTER — Other Ambulatory Visit: Payer: Self-pay | Admitting: Family Medicine

## 2022-07-10 DIAGNOSIS — I10 Essential (primary) hypertension: Secondary | ICD-10-CM

## 2022-07-14 ENCOUNTER — Ambulatory Visit (INDEPENDENT_AMBULATORY_CARE_PROVIDER_SITE_OTHER): Payer: Medicare HMO | Admitting: Family Medicine

## 2022-07-14 ENCOUNTER — Encounter: Payer: Self-pay | Admitting: Family Medicine

## 2022-07-14 VITALS — BP 134/76 | HR 77 | Temp 97.0°F | Ht 61.0 in | Wt 193.8 lb

## 2022-07-14 DIAGNOSIS — E538 Deficiency of other specified B group vitamins: Secondary | ICD-10-CM

## 2022-07-14 DIAGNOSIS — I10 Essential (primary) hypertension: Secondary | ICD-10-CM | POA: Diagnosis not present

## 2022-07-14 DIAGNOSIS — C73 Malignant neoplasm of thyroid gland: Secondary | ICD-10-CM | POA: Diagnosis not present

## 2022-07-14 DIAGNOSIS — C3431 Malignant neoplasm of lower lobe, right bronchus or lung: Secondary | ICD-10-CM | POA: Diagnosis not present

## 2022-07-14 DIAGNOSIS — E782 Mixed hyperlipidemia: Secondary | ICD-10-CM

## 2022-07-14 DIAGNOSIS — E559 Vitamin D deficiency, unspecified: Secondary | ICD-10-CM

## 2022-07-14 DIAGNOSIS — R6889 Other general symptoms and signs: Secondary | ICD-10-CM | POA: Diagnosis not present

## 2022-07-14 MED ORDER — FLUTICASONE FUROATE-VILANTEROL 200-25 MCG/ACT IN AEPB: 1.0000 | INHALATION_SPRAY | Freq: Every day | RESPIRATORY_TRACT | 11 refills | Status: DC

## 2022-07-14 MED ORDER — FAMOTIDINE 40 MG PO TABS: 40.0000 mg | ORAL_TABLET | Freq: Every day | ORAL | 2 refills | Status: DC

## 2022-07-14 MED ORDER — AMLODIPINE BESYLATE 5 MG PO TABS: 5.0000 mg | ORAL_TABLET | Freq: Every day | ORAL | 3 refills | Status: DC

## 2022-07-14 MED ORDER — RALOXIFENE HCL 60 MG PO TABS: 60.0000 mg | ORAL_TABLET | Freq: Every day | ORAL | 2 refills | Status: DC

## 2022-07-14 MED ORDER — HYDROCHLOROTHIAZIDE 25 MG PO TABS: 25.0000 mg | ORAL_TABLET | Freq: Every day | ORAL | 3 refills | Status: DC

## 2022-07-14 MED ORDER — CYANOCOBALAMIN 1000 MCG/ML IJ SOLN: 1000.0000 ug | INTRAMUSCULAR | 11 refills | Status: AC

## 2022-07-14 NOTE — Progress Notes (Signed)
Subjective:  Patient ID: Renee Maldonado, female    DOB: 02-27-1946  Age: 76 y.o. MRN: [id number]  CC: Medical Management of Chronic Issues   HPI Renee Maldonado presents for concern for not having been contacted by Dr. Tammi Klippel for her one month follow up phone conference. He was on vacation and his covering MD told her that. Also told she would have MRI at three months. Concerned about waiting on thyroid cancer treatment being delayed. Has consult set up for 11/16. Wants to coordinate with husband' upcoming knee replacement. She is concerned that is too long to wait. Also concerned that she doesn't want to get treatment at [facility]. REquests Thyroid and lung cancer tx to be done at [facility].      07/14/2022   10:08 AM 07/14/2022    9:56 AM 01/13/2022    3:29 PM  Depression screen PHQ 2/9  Decreased Interest 0 0 0  Down, Depressed, Hopeless 0 0 0  PHQ - 2 Score 0 0 0  Altered sleeping 0  0  Tired, decreased energy 1  2  Change in appetite 0  0  Feeling bad or failure about yourself  0  0  Trouble concentrating 0  0  Moving slowly or fidgety/restless 0  0  Suicidal thoughts 0  0  PHQ-9 Score 1  2  Difficult doing work/chores Not difficult at all  Somewhat difficult    History Renee Maldonado has a past medical history of Anxiety, Arthritis, Cancer ([place]), COPD (chronic obstructive pulmonary disease) ([place]), GERD (gastroesophageal reflux disease), Humerus fracture (2007), Hyperlipidemia, Hypertension, Insomnia, and OA (osteoarthritis) of knee (10/08/2020).   She has a past surgical history that includes Total knee arthroplasty; Left shoulder surgery; Joint replacement; Tubal ligation; Breast surgery (Right, 01/2019); Breast lumpectomy (Right, 02/2019); Total knee arthroplasty (Right, 10/08/2020); Bronchial biopsy (04/15/2022); Bronchial needle aspiration biopsy (04/15/2022); Bronchial brushings (04/15/2022); Video bronchoscopy with radial endobronchial ultrasound (04/15/2022); and Fiducial marker placement  (04/15/2022).   Her family history includes Alzheimer's disease in her mother; Cancer in her brother; Diabetes in her mother; Hypertension in her father; Stroke in her father.She reports that she quit smoking about 7 years ago. Her smoking use included cigarettes. She has a 10.00 pack-year smoking history. She has never used smokeless tobacco. She reports current alcohol use. She reports that she does not use drugs.    ROS Review of Systems  Constitutional: Negative.   HENT: Negative.    Eyes:  Negative for visual disturbance.  Respiratory:  Negative for shortness of breath.   Cardiovascular:  Negative for chest pain.  Gastrointestinal:  Negative for abdominal pain.  Musculoskeletal:  Negative for arthralgias.    Objective:  BP 134/76   Pulse 77   Temp (!) 97 F (36.1 C)   Ht '5\' 1"'  (1.549 m)   Wt 193 lb 12.8 oz (87.9 kg)   SpO2 96%   BMI 36.62 kg/m   BP Readings from Last 3 Encounters:  07/14/22 134/76  04/25/22 126/68  04/23/22 (!) 148/67    Wt Readings from Last 3 Encounters:  07/14/22 193 lb 12.8 oz (87.9 kg)  04/25/22 193 lb (87.5 kg)  04/23/22 195 lb 3.2 oz (88.5 kg)     Physical Exam Constitutional:      General: She is not in acute distress.    Appearance: She is well-developed.  Cardiovascular:     Rate and Rhythm: Normal rate and regular rhythm.  Pulmonary:     Breath sounds: Normal breath sounds.  Musculoskeletal:  General: Normal range of motion.  Skin:    General: Skin is warm and dry.  Neurological:     Mental Status: She is alert and oriented to person, place, and time.       Assessment & Plan:   Renee Maldonado was seen today for medical management of chronic issues.  Diagnoses and all orders for this visit:  Papillary thyroid carcinoma ([place])  Primary hypertension -     CMP14+EGFR -     CBC with Differential/Platelet -     amLODipine (NORVASC) 5 MG tablet; Take 1 tablet (5 mg total) by mouth daily. -     hydrochlorothiazide  (HYDRODIURIL) 25 MG tablet; Take 1 tablet (25 mg total) by mouth daily. -     CBC with Differential/Platelet -     CMP14+EGFR  Mixed hyperlipidemia -     Lipid panel -     Lipid panel  Vitamin D deficiency -     VITAMIN D 25 Hydroxy (Vit-D Deficiency, Fractures)  B12 deficiency -     Vitamin B12  Cancer of thyroid ([place]) -     Ambulatory referral to Hematology / Oncology  Primary adnenocarcinoma of right lower lobe of lung ([place])  Other orders -     famotidine (PEPCID) 40 MG tablet; Take 1 tablet (40 mg total) by mouth daily. -     cyanocobalamin (VITAMIN B12) 1000 MCG/ML injection; Inject 1 mL (1,000 mcg total) into the skin every 30 (thirty) days. -     raloxifene (EVISTA) 60 MG tablet; Take 1 tablet (60 mg total) by mouth daily. -     fluticasone furoate-vilanterol (BREO ELLIPTA) 200-25 MCG/ACT AEPB; Inhale 1 puff into the lungs daily.       I have discontinued Renee Maldonado's clindamycin and budesonide-formoterol. I have also changed her amLODipine, cyanocobalamin, hydrochlorothiazide, and raloxifene. Additionally, I am having her start on fluticasone furoate-vilanterol. Lastly, I am having her maintain her Restasis, acetaminophen, anastrozole, Chlor-Trimeton, valACYclovir, albuterol, hydrOXYzine, rosuvastatin, aspirin EC, Calcium Carb-Cholecalciferol, cholecalciferol, traZODone, and famotidine. We will continue to administer cyanocobalamin.  Allergies as of 07/14/2022       Reactions   Benadryl [diphenhydramine Hcl] Nausea Only   Dizziness   Penicillins    Had a rash around 20 years ago but unsure if it come from medicine.  Tolerated Cephalosporin Date: 10/09/20.        Medication List        Accurate as of July 14, 2022 10:47 AM. If you have any questions, ask your nurse or doctor.          STOP taking these medications    budesonide-formoterol 160-4.5 MCG/ACT inhaler Commonly known as: SYMBICORT Stopped by: Claretta Fraise, MD   clindamycin 300 MG  capsule Commonly known as: CLEOCIN Stopped by: Claretta Fraise, MD       TAKE these medications    acetaminophen 500 MG tablet Commonly known as: TYLENOL Take 500 mg by mouth every 6 (six) hours as needed for moderate pain.   albuterol 108 (90 Base) MCG/ACT inhaler Commonly known as: VENTOLIN HFA 2 puffs in lungs every 6 hours as needed for wheezing/short of breath.   amLODipine 5 MG tablet Commonly known as: NORVASC Take 1 tablet (5 mg total) by mouth daily.   anastrozole 1 MG tablet Commonly known as: ARIMIDEX Take 1 mg by mouth daily.   aspirin EC 325 MG tablet Take 650 mg by mouth daily as needed for moderate pain.   Calcium Carb-Cholecalciferol 600-20 MG-MCG Tabs Take  1 tablet by mouth daily.   Chlor-Trimeton 4 MG tablet Generic drug: chlorpheniramine Take 4 mg by mouth in the morning and at bedtime.   cholecalciferol 25 MCG (1000 UNIT) tablet Commonly known as: VITAMIN D3 Take 1,000 Units by mouth daily.   cyanocobalamin 1000 MCG/ML injection Commonly known as: VITAMIN B12 Inject 1 mL (1,000 mcg total) into the skin every 30 (thirty) days.   famotidine 40 MG tablet Commonly known as: PEPCID Take 1 tablet (40 mg total) by mouth daily.   fluticasone furoate-vilanterol 200-25 MCG/ACT Aepb Commonly known as: Breo Ellipta Inhale 1 puff into the lungs daily. Started by: Claretta Fraise, MD   hydrochlorothiazide 25 MG tablet Commonly known as: HYDRODIURIL Take 1 tablet (25 mg total) by mouth daily.   hydrOXYzine 25 MG tablet Commonly known as: ATARAX Take 25 mg by mouth every 8 (eight) hours as needed for itching.   raloxifene 60 MG tablet Commonly known as: EVISTA Take 1 tablet (60 mg total) by mouth daily.   Restasis 0.05 % ophthalmic emulsion Generic drug: cycloSPORINE Place 1 drop into both eyes 2 (two) times daily as needed (dry eyes).   rosuvastatin 20 MG tablet Commonly known as: CRESTOR TAKE 1 TABLET DAILY   traZODone 100 MG tablet Commonly  known as: DESYREL TAKE 1 TABLET AT BEDTIME   valACYclovir 500 MG tablet Commonly known as: VALTREX TAKE (1) TABLET DAILY AS NEEDED.         Follow-up: Return in about 3 months (around 10/14/2022).  Claretta Fraise, M.D.

## 2022-07-15 LAB — CBC WITH DIFFERENTIAL/PLATELET
Basophils Absolute: 0 10*3/uL (ref 0.0–0.2)
Basos: 1 %
EOS (ABSOLUTE): 0.1 10*3/uL (ref 0.0–0.4)
Eos: 2 %
Hematocrit: 36.8 % (ref 34.0–46.6)
Hemoglobin: 12 g/dL (ref 11.1–15.9)
Immature Grans (Abs): 0 10*3/uL (ref 0.0–0.1)
Immature Granulocytes: 0 %
Lymphocytes Absolute: 0.9 10*3/uL (ref 0.7–3.1)
Lymphs: 18 %
MCH: 27 pg (ref 26.6–33.0)
MCHC: 32.6 g/dL (ref 31.5–35.7)
MCV: 83 fL (ref 79–97)
Monocytes Absolute: 0.5 10*3/uL (ref 0.1–0.9)
Monocytes: 9 %
Neutrophils Absolute: 3.7 10*3/uL (ref 1.4–7.0)
Neutrophils: 70 %
Platelets: 274 10*3/uL (ref 150–450)
RBC: 4.44 x10E6/uL (ref 3.77–5.28)
RDW: 13.4 % (ref 11.7–15.4)
WBC: 5.3 10*3/uL (ref 3.4–10.8)

## 2022-07-15 LAB — LIPID PANEL
Chol/HDL Ratio: 3 ratio (ref 0.0–4.4)
Cholesterol, Total: 122 mg/dL (ref 100–199)
HDL: 41 mg/dL (ref >39)
LDL Chol Calc (NIH): 47 mg/dL (ref 0–99)
Triglycerides: 210 mg/dL — ABNORMAL HIGH (ref 0–149)
VLDL Cholesterol Cal: 34 mg/dL (ref 5–40)

## 2022-07-15 LAB — VITAMIN B12: Vitamin B-12: 580 pg/mL (ref 232–1245)

## 2022-07-15 LAB — CMP14+EGFR
ALT: 12 IU/L (ref 0–32)
AST: 12 IU/L (ref 0–40)
Albumin/Globulin Ratio: 2.2 (ref 1.2–2.2)
Albumin: 4.4 g/dL (ref 3.8–4.8)
Alkaline Phosphatase: 62 IU/L (ref 44–121)
BUN/Creatinine Ratio: 14 (ref 12–28)
BUN: 18 mg/dL (ref 8–27)
Bilirubin Total: 0.4 mg/dL (ref 0.0–1.2)
CO2: 26 mmol/L (ref 20–29)
Calcium: 9.8 mg/dL (ref 8.7–10.3)
Chloride: 104 mmol/L (ref 96–106)
Creatinine, Ser: 1.32 mg/dL — ABNORMAL HIGH (ref 0.57–1.00)
Globulin, Total: 2 g/dL (ref 1.5–4.5)
Glucose: 110 mg/dL — ABNORMAL HIGH (ref 70–99)
Potassium: 4.4 mmol/L (ref 3.5–5.2)
Sodium: 143 mmol/L (ref 134–144)
Total Protein: 6.4 g/dL (ref 6.0–8.5)
eGFR: 42 mL/min/{1.73_m2} — ABNORMAL LOW (ref >59)

## 2022-07-15 LAB — VITAMIN D 25 HYDROXY (VIT D DEFICIENCY, FRACTURES): Vit D, 25-Hydroxy: 53.5 ng/mL (ref 30.0–100.0)

## 2022-07-15 NOTE — Progress Notes (Signed)
Hello Ariele,  Your lab result is normal and/or stable.Some minor variations that are not significant are commonly marked abnormal, but do not represent any medical problem for you.  Best regards, Claretta Fraise, M.D.

## 2022-07-22 ENCOUNTER — Telehealth: Payer: Self-pay | Admitting: *Deleted

## 2022-07-22 NOTE — Telephone Encounter (Signed)
RETURNED PATIENT'S PHONE CALL, SPOKE WITH PATIENT. ?

## 2022-07-23 ENCOUNTER — Other Ambulatory Visit: Payer: Self-pay | Admitting: Family Medicine

## 2022-07-23 DIAGNOSIS — C73 Malignant neoplasm of thyroid gland: Secondary | ICD-10-CM

## 2022-07-23 NOTE — Progress Notes (Signed)
Oncology Nurse Navigator Documentation   I called Renee Maldonado today and discussed her recent referral to Medical Oncology from Dr. Livia Snellen for her thyroid carcinoma. I explained that this type of cancer is not treated with chemotherapy and that we would be cancelling her referral. I explained that this cancer was treated by ENT and Endocrinology providers. She has seen Dr. Fredric Dine ENT for this and I advised her to call her office to be seen to discuss possible surgery. She is also scheduled to see Dr. Denton Lank of Endocrinology on 10/02/22 to discuss further treatment. I have provided her with my direct number if she should have any further questions regarding her thyroid cancer.   Harlow Asa RN, BSN, OCN Head & Neck Oncology Nurse [place] [facility] Phone # [phone]  Fax # [phone]

## 2022-07-29 ENCOUNTER — Encounter: Payer: Self-pay | Admitting: Urology

## 2022-07-29 NOTE — Progress Notes (Signed)
  Radiation Oncology         (336) [phone] ________________________________  Name: Renee Maldonado MRN: [id number]  Date: 06/09/2022  DOB: [birth year]  End of Treatment Note  Diagnosis:   76 y.o. female with newly diagnosed NSCLC, adenocarcinoma of the right lower lobe lung, Stage IA.     Indication for treatment:  Curative, Definitive SBRT       Radiation treatment dates:   06/02/22 - 06/09/22  Site/dose:   The target in the RLL lung was treated to 54 Gy in 3 fractions of 18 Gy  Beams/energy:   The patient was treated using stereotactic body radiotherapy according to a 3D conformal radiotherapy plan.  Volumetric arc fields were employed to deliver 6 MV X-rays.  Image guidance was performed with per fraction cone beam CT prior to treatment under personal MD supervision.  Immobilization was achieved using BodyFix Pillow.  Narrative: The patient tolerated radiation treatment relatively well with only mild fatigue.  Plan: The patient has completed radiation treatment. The patient will return to radiation oncology clinic for routine followup in one month. I advised them to call or return sooner if they have any questions or concerns related to their recovery or treatment. ________________________________  Sheral Apley. Tammi Klippel, M.D.

## 2022-07-29 NOTE — Progress Notes (Signed)
Telephone appointment. I spoke w/ patient's spouse Mr. Corrine Tillis, verified his identity and began nursing interview. No issues reported at this time.  Meaningful use complete.  Reminded spouse of patient's 10:30am-07/30/22 telephone appointment w/ Ashlyn Bruning PA-C. I left my extension [phone] in case patient needs anything. Spouse verbalized understanding.  Patient contact [phone]

## 2022-07-29 NOTE — Progress Notes (Signed)
Radiation Oncology         (336) [phone] ________________________________  Name: Renee Maldonado MRN: [id number]  Date: 07/30/2022  DOB: Feb 13, 1946  Post Treatment Note  CC: Claretta Fraise, MD  Garner Nash, DO  Diagnosis:   76 y.o. female with newly diagnosed NSCLC, adenocarcinoma of the right lower lobe lung, Stage IA.  Interval Since Last Radiation:  7.5 weeks  06/02/22 - 06/09/22:   The target in the RLL lung was treated to 54 Gy in 3 fractions of 18 Gy  Narrative:  I spoke with the patient to conduct her routine scheduled 1 month follow up visit via telephone to spare the patient unnecessary potential exposure in the healthcare setting during the current COVID-19 pandemic.  The patient was notified in advance and gave permission to proceed with this visit format.  She tolerated radiation treatment relatively well with only mild fatigue.                              On review of systems, the patient states that she is doing well in general and remains without complaints. She specifically denies dysphagia, shortness of breath, productive cough, chest pain or hemoptysis. She reports a healthy appetite and is maintaining her weight. No recent fevers, chills or night sweats.  She was also diagnosed with papillary thyroid carcinoma around the same time as her lung cancer diagnosis and has seen Dr. Fredric Dine on 04/01/22 to discuss treatment. The recommendation was for surgical resection, likely hemithyroidectomy, to be scheduled after completing her lung treatments. She is quite anxious about the delay in treating the thyroid cancer but has not yet scheduled her follow up visit to discuss this further now that she has completed radiotherapy and would like to see a different provider in that group. She is also planning to keep her scheduled consult visit with Dr. Denton Lank in Endocrinology at [facility] on 10/02/22 to help manage the thyroid cancer and continues in routine follow up with Dr. Loanne Drilling for her  history of breast cancer.   ALLERGIES:  is allergic to benadryl [diphenhydramine hcl] and penicillins.  Meds: Current Outpatient Medications  Medication Sig Dispense Refill   acetaminophen (TYLENOL) 500 MG tablet Take 500 mg by mouth every 6 (six) hours as needed for moderate pain.      albuterol (VENTOLIN HFA) 108 (90 Base) MCG/ACT inhaler 2 puffs in lungs every 6 hours as needed for wheezing/short of breath. 8.5 g 5   amLODipine (NORVASC) 5 MG tablet Take 1 tablet (5 mg total) by mouth daily. 90 tablet 3   anastrozole (ARIMIDEX) 1 MG tablet Take 1 mg by mouth daily.      aspirin EC 325 MG tablet Take 650 mg by mouth daily as needed for moderate pain.     Calcium Carb-Cholecalciferol 600-20 MG-MCG TABS Take 1 tablet by mouth daily.     chlorpheniramine (CHLOR-TRIMETON) 4 MG tablet Take 4 mg by mouth in the morning and at bedtime.      cholecalciferol (VITAMIN D3) 25 MCG (1000 UNIT) tablet Take 1,000 Units by mouth daily.     cyanocobalamin (VITAMIN B12) 1000 MCG/ML injection Inject 1 mL (1,000 mcg total) into the skin every 30 (thirty) days. 1 mL 11   famotidine (PEPCID) 40 MG tablet Take 1 tablet (40 mg total) by mouth daily. 90 tablet 2   fluticasone furoate-vilanterol (BREO ELLIPTA) 200-25 MCG/ACT AEPB Inhale 1 puff into the lungs daily. 1 each 11  hydrochlorothiazide (HYDRODIURIL) 25 MG tablet Take 1 tablet (25 mg total) by mouth daily. 90 tablet 3   hydrOXYzine (ATARAX/VISTARIL) 25 MG tablet Take 25 mg by mouth every 8 (eight) hours as needed for itching.     raloxifene (EVISTA) 60 MG tablet Take 1 tablet (60 mg total) by mouth daily. 90 tablet 2   RESTASIS 0.05 % ophthalmic emulsion Place 1 drop into both eyes 2 (two) times daily as needed (dry eyes).      rosuvastatin (CRESTOR) 20 MG tablet TAKE 1 TABLET DAILY 90 tablet 0   traZODone (DESYREL) 100 MG tablet TAKE 1 TABLET AT BEDTIME 90 tablet 3   valACYclovir (VALTREX) 500 MG tablet TAKE (1) TABLET DAILY AS NEEDED. 30 tablet 2    Current Facility-Administered Medications  Medication Dose Route Frequency Provider Last Rate Last Admin   cyanocobalamin ((VITAMIN B-12)) injection 1,000 mcg  1,000 mcg Intramuscular Q30 days Claretta Fraise, MD   1,000 mcg at 07/04/22 1018    Physical Findings:  vitals were not taken for this visit.  Pain Assessment Pain Score: 0-No pain/10 Unable to assess due to telephone follow up visit format.  Lab Findings: Lab Results  Component Value Date   WBC 5.3 07/14/2022   HGB 12.0 07/14/2022   HCT 36.8 07/14/2022   MCV 83 07/14/2022   PLT 274 07/14/2022     Radiographic Findings: No results found.  Impression/Plan: 32. 76 y.o. female with newly diagnosed NSCLC, adenocarcinoma of the right lower lobe lung, Stage IA. She appears to have recovered well from the effects of her recent SBRT lung treatment and is currently without complaints. We discussed the plan to obtain a post-treatment CT Chest scan at 3 months and if this remains stable, we will then proceed with serial CT Chest scans every 6 months to monitor for any evidence of disease recurrence or progression. I will call her to review the results of each scan but she knows that she is welcome to call at any time in the interim with questions or concerns. She will call to get back in with [facility]NT but prefers to see a different provider than Dr. Fredric Dine, to discuss treatment of the thyroid cancer now that she has completed chest radiation. She is also planning to keep her scheduled consult visit with Dr. Denton Lank in Endocrinology at [facility] on 10/02/22 to help manage the thyroid cancer and will continue in routine follow up with Dr. Loanne Drilling for her history of breast cancer.     Nicholos Johns, PA-C

## 2022-07-30 ENCOUNTER — Ambulatory Visit
Admission: RE | Admit: 2022-07-30 | Discharge: 2022-07-30 | Disposition: A | Discharge status: Home / Self Care | Payer: Medicare HMO | Source: Ambulatory Visit | Attending: Urology | Admitting: Urology

## 2022-07-30 DIAGNOSIS — C3431 Malignant neoplasm of lower lobe, right bronchus or lung: Secondary | ICD-10-CM | POA: Diagnosis not present

## 2022-07-30 DIAGNOSIS — Z87891 Personal history of nicotine dependence: Secondary | ICD-10-CM | POA: Diagnosis not present

## 2022-07-31 ENCOUNTER — Ambulatory Visit: Payer: Self-pay | Admitting: Urology

## 2022-08-01 ENCOUNTER — Telehealth: Payer: Self-pay

## 2022-08-01 NOTE — Telephone Encounter (Signed)
RN called Renee Maldonado after identity was confirmed inform her about getting a new ENT, she said that a scheduler from [facility] called her with new appointment for 08/11/2022 at 9:00.  She wasn't happy about that because she wanted to see someone in the [facility] system.  Renee Maldonado stated she just don't understand why there are no [place] [facility]'s.  She expressed that she wasn't happy about it but, will go to the scheduled appointment on 08/11/2022 at 9:00.

## 2022-08-04 ENCOUNTER — Ambulatory Visit: Payer: Medicare HMO

## 2022-08-05 ENCOUNTER — Ambulatory Visit (INDEPENDENT_AMBULATORY_CARE_PROVIDER_SITE_OTHER): Payer: Medicare HMO

## 2022-08-05 DIAGNOSIS — E538 Deficiency of other specified B group vitamins: Secondary | ICD-10-CM | POA: Diagnosis not present

## 2022-08-05 DIAGNOSIS — Z0000 Encounter for general adult medical examination without abnormal findings: Secondary | ICD-10-CM

## 2022-08-05 NOTE — Progress Notes (Signed)
MEDICARE ANNUAL WELLNESS VISIT  08/05/2022  Telephone Visit Disclaimer This Medicare AWV was conducted by telephone due to national recommendations for restrictions regarding the COVID-19 Pandemic (e.g. social distancing).  I verified, using two identifiers, that I am speaking with Renee Maldonado or their authorized healthcare agent. I discussed the limitations, risks, security, and privacy concerns of performing an evaluation and management service by telephone and the potential availability of an in-person appointment in the future. The patient expressed understanding and agreed to proceed.  Location of Patient: Home Location of Provider (nurse):  [facility]  Subjective:    [name] is a 76 y.o. female patient of Stacks, Cletus Gash, MD who had a Medicare Annual Wellness Visit today via telephone. Renee Maldonado is Retired and lives with their spouse. She has three children and had eight grandchildren but one grandson has passed away, and five great grandchildren.  She reports that she is socially active and does interact with friends/family regularly. She is minimally physically active and enjoys doing cross stitch,  working puzzles and reading.  Patient Care Team: Claretta Fraise, MD as PCP - General (Family Medicine) Karlene Einstein, MD as Referring Physician (Orthopedic Surgery)     08/05/2022    9:57 AM 07/29/2022    2:01 PM 05/02/2022    8:27 AM 04/23/2022    1:41 PM 04/15/2022    9:11 AM 01/03/2022    8:58 PM 08/02/2021   10:26 AM  Advanced Directives  Does Patient Have a Medical Advance Directive? Yes Yes Yes Yes Yes No Yes  Type of Paramedic of [place];Living will Living will;Healthcare Power of Attorney Living will;Healthcare Power of Attorney Living will;Healthcare Power of Attorney Living will  [place];Living will  Does patient want to make changes to medical advance directive? No - Patient declined        Copy of [place] in  Chart? No - copy requested      No - copy requested  Would patient like information on creating a medical advance directive?     No - Patient declined No - Patient declined     Hospital Utilization Over the Past 12 Months: # of hospitalizations or ER visits: 1 # of surgeries: 0  Review of Systems    Patient reports that her overall health is unchanged compared to last year.  History obtained from chart review and the patient  Patient Reported Readings (BP, Pulse, CBG, Weight, etc) none  Pain Assessment Pain : No/denies pain Pain Score: 0-No pain     Current Medications & Allergies (verified) Allergies as of 08/05/2022       Reactions   Benadryl [diphenhydramine Hcl] Nausea Only   Dizziness   Penicillins    Had a rash around 20 years ago but unsure if it come from medicine.  Tolerated Cephalosporin Date: 10/09/20.        Medication List        Accurate as of August 05, 2022 10:07 AM. If you have any questions, ask your nurse or doctor.          acetaminophen 500 MG tablet Commonly known as: TYLENOL Take 500 mg by mouth every 6 (six) hours as needed for moderate pain.   albuterol 108 (90 Base) MCG/ACT inhaler Commonly known as: VENTOLIN HFA 2 puffs in lungs every 6 hours as needed for wheezing/short of breath.   amLODipine 5 MG tablet Commonly known as: NORVASC Take 1 tablet (5 mg total) by  mouth daily.   anastrozole 1 MG tablet Commonly known as: ARIMIDEX Take 1 mg by mouth daily.   aspirin EC 325 MG tablet Take 650 mg by mouth daily as needed for moderate pain.   Calcium Carb-Cholecalciferol 600-20 MG-MCG Tabs Take 1 tablet by mouth daily.   Chlor-Trimeton 4 MG tablet Generic drug: chlorpheniramine Take 4 mg by mouth in the morning and at bedtime.   cholecalciferol 25 MCG (1000 UNIT) tablet Commonly known as: VITAMIN D3 Take 1,000 Units by mouth daily.   cyanocobalamin 1000 MCG/ML injection Commonly known as: VITAMIN B12 Inject 1 mL  (1,000 mcg total) into the skin every 30 (thirty) days.   famotidine 40 MG tablet Commonly known as: PEPCID Take 1 tablet (40 mg total) by mouth daily.   fluticasone furoate-vilanterol 200-25 MCG/ACT Aepb Commonly known as: Breo Ellipta Inhale 1 puff into the lungs daily.   hydrochlorothiazide 25 MG tablet Commonly known as: HYDRODIURIL Take 1 tablet (25 mg total) by mouth daily.   hydrOXYzine 25 MG tablet Commonly known as: ATARAX Take 25 mg by mouth every 8 (eight) hours as needed for itching.   Melatonin 5 MG Caps Take 5 mg by mouth at bedtime.   raloxifene 60 MG tablet Commonly known as: EVISTA Take 1 tablet (60 mg total) by mouth daily.   Restasis 0.05 % ophthalmic emulsion Generic drug: cycloSPORINE Place 1 drop into both eyes 2 (two) times daily as needed (dry eyes).   rosuvastatin 20 MG tablet Commonly known as: CRESTOR TAKE 1 TABLET DAILY   traZODone 100 MG tablet Commonly known as: DESYREL TAKE 1 TABLET AT BEDTIME   valACYclovir 500 MG tablet Commonly known as: VALTREX TAKE (1) TABLET DAILY AS NEEDED.        History (reviewed): Past Medical History:  Diagnosis Date   Anxiety    Arthritis    Cancer ([place])    breast Right no chemo or radiation  on Arimidex   COPD (chronic obstructive pulmonary disease) ([place])    on Breo   GERD (gastroesophageal reflux disease)    Humerus fracture 2007   Hyperlipidemia    Hypertension    Insomnia    OA (osteoarthritis) of knee 10/08/2020   Past Surgical History:  Procedure Laterality Date   BREAST LUMPECTOMY Right 02/2019   BREAST SURGERY Right 01/2019   breast biopsy   BRONCHIAL BIOPSY  04/15/2022   Procedure: BRONCHIAL BIOPSIES;  Surgeon: Garner Nash, DO;  Location: [facility];  Service: Pulmonary;;   BRONCHIAL BRUSHINGS  04/15/2022   Procedure: BRONCHIAL BRUSHINGS;  Surgeon: Garner Nash, DO;  Location: [facility];  Service: Pulmonary;;   BRONCHIAL NEEDLE ASPIRATION BIOPSY  04/15/2022    Procedure: BRONCHIAL NEEDLE ASPIRATION BIOPSIES;  Surgeon: Garner Nash, DO;  Location: [facility];  Service: Pulmonary;;   FIDUCIAL MARKER PLACEMENT  04/15/2022   Procedure: FIDUCIAL MARKER PLACEMENT;  Surgeon: Garner Nash, DO;  Location: [facility];  Service: Pulmonary;;   JOINT REPLACEMENT     left knee   Left shoulder surgery     2007   TOTAL KNEE ARTHROPLASTY     2012 left   TOTAL KNEE ARTHROPLASTY Right 10/08/2020   Procedure: TOTAL KNEE ARTHROPLASTY;  Surgeon: Gaynelle Arabian, MD;  Location: [facility];  Service: Orthopedics;  Laterality: Right;   TUBAL LIGATION     VIDEO BRONCHOSCOPY WITH RADIAL ENDOBRONCHIAL ULTRASOUND  04/15/2022   Procedure: VIDEO BRONCHOSCOPY WITH RADIAL ENDOBRONCHIAL ULTRASOUND;  Surgeon: Garner Nash, DO;  Location: [facility];  Service: Pulmonary;;   Family History  Problem Relation Age of Onset   Alzheimer's disease Mother    Diabetes Mother    Stroke Father    Hypertension Father    Cancer Brother    Social History   Socioeconomic History   Marital status: Married    Spouse name: Jori Moll   Number of children: 3   Years of education: 16   Highest education level: Bachelor's degree (e.g., BA, AB, BS)  Occupational History   Occupation: retired  Tobacco Use   Smoking status: Former    Packs/day: 0.25    Years: 40.00    Total pack years: 10.00    Types: Cigarettes    Quit date: 09/17/2014    Years since quitting: 7.8   Smokeless tobacco: Never   Tobacco comments:    smokes 3 a day  Vaping Use   Vaping Use: Former   Start date: 07/18/2018   Quit date: 07/19/2019  Substance and Sexual Activity   Alcohol use: Yes    Alcohol/week: 0.0 standard drinks of alcohol    Comment: rarely-     Drug use: No   Sexual activity: Not Currently  Other Topics Concern   Not on file  Social History Narrative   Lives home with her husband. Enjoys travelling, playing bingo, exercising at Recreation center, eats out a lot   Social Determinants of  Health   Financial Resource Strain: Low Risk  (08/02/2021)   Overall Financial Resource Strain (CARDIA)    Difficulty of Paying Living Expenses: Not hard at all  Food Insecurity: No Food Insecurity (08/02/2021)   Hunger Vital Sign    Worried About Running Out of Food in the Last Year: Never true    Ran Out of Food in the Last Year: Never true  Transportation Needs: No Transportation Needs (08/02/2021)   PRAPARE - Hydrologist (Medical): No    Lack of Transportation (Non-Medical): No  Physical Activity: Insufficiently Active (08/02/2021)   Exercise Vital Sign    Days of Exercise per Week: 4 days    Minutes of Exercise per Session: 30 min  Stress: No Stress Concern Present (08/02/2021)   [place]    Feeling of Stress : Not at all  Social Connections: [place] (08/02/2021)   Social Connection and Isolation Panel [NHANES]    Frequency of Communication with Friends and Family: More than three times a week    Frequency of Social Gatherings with Friends and Family: More than three times a week    Attends Religious Services: More than 4 times per year    Active Member of [employer] or Organizations: Yes    Attends Archivist Meetings: More than 4 times per year    Marital Status: Married    Activities of Daily Living    08/05/2022    9:58 AM  In your present state of health, do you have any difficulty performing the following activities:  Hearing? 1  Vision? 0  Difficulty concentrating or making decisions? 0  Walking or climbing stairs? 1  Dressing or bathing? 0  Doing errands, shopping? 0  Preparing Food and eating ? N  Using the Toilet? N  In the past six months, have you accidently leaked urine? N  Do you have problems with loss of bowel control? N  Managing your Medications? N  Managing your Finances? N  Housekeeping or managing your Housekeeping? Y   Patient  reports  that she has noticed some slight decline in her hearing but does not feel it is bad enough to be evaluated for hearing aids.  She experiences some back pain when walking long distances.  She has a daughter who is separated from her spouse who lives with her and her husband.  She does all of the cleaning around the house.  Patient Education/ Literacy How often do you need to have someone help you when you read instructions, pamphlets, or other written materials from your doctor or pharmacy?: 1 - Never What is the last grade level you completed in school?: Bachelor's Degree  Exercise Current Exercise Habits: The patient does not participate in regular exercise at present, Exercise limited by: None identified  Diet Patient reports consuming 2 meals a day and 1 snack(s) a day Patient reports that her primary diet is: Regular Patient reports that she does have regular access to food.   Depression Screen    07/14/2022   10:08 AM 07/14/2022    9:56 AM 01/13/2022    3:29 PM 11/25/2021    8:32 AM 09/23/2021    8:52 AM 08/02/2021   10:02 AM 03/19/2021    8:49 AM  PHQ 2/9 Scores  PHQ - 2 Score 0 0 0 0 0 0 0  PHQ- 9 Score 1  2 2 4        Fall Risk    08/05/2022   10:07 AM 07/14/2022   10:07 AM 07/14/2022    9:56 AM 01/13/2022    3:29 PM 08/02/2021   10:04 AM  Fall Risk   Falls in the past year? 1 1 0 0 0  Number falls in past yr: 0 0  0 0  Injury with Fall? 1 1  0 0  Risk for fall due to : History of fall(s) History of fall(s)   No Fall Risks;Impaired balance/gait  Follow up Falls evaluation completed Falls evaluation completed   Falls prevention discussed     Objective:  Roselia Rockhold seemed alert and oriented and she participated appropriately during our telephone visit.  Blood Pressure Weight BMI  BP Readings from Last 3 Encounters:  07/14/22 134/76  04/25/22 126/68  04/23/22 (!) 148/67   Wt Readings from Last 3 Encounters:  07/14/22 193 lb 12.8 oz (87.9 kg)  04/25/22 193 lb (87.5 kg)   04/23/22 195 lb 3.2 oz (88.5 kg)   BMI Readings from Last 1 Encounters:  07/14/22 36.62 kg/m    *Unable to obtain current vital signs, weight, and BMI due to telephone visit type  Hearing/Vision  Lashay did not seem to have difficulty with hearing/understanding during the telephone conversation Reports that she has not had a formal eye exam by an eye care professional within the past year Reports that she has not had a formal hearing evaluation within the past year *Unable to fully assess hearing and vision during telephone visit type  Cognitive Function:    08/05/2022   10:02 AM 08/01/2020    9:58 AM 07/28/2019    9:54 AM  6CIT Screen  What Year? 0 points 0 points 0 points  What month? 0 points 0 points 0 points  What time? 0 points 0 points 0 points  Count back from 20 0 points 0 points 0 points  Months in reverse 0 points 0 points 0 points  Repeat phrase 0 points 0 points 0 points  Total Score 0 points 0 points 0 points   (Normal:0-7, Significant for Dysfunction: >8)  Normal  Cognitive Function Screening: Yes   Immunization & Health Maintenance Record Immunization History  Administered Date(s) Administered   Fluad Quad(high Dose 65+) 07/20/2019, 08/20/2020, 08/16/2021   H1N1 10/24/2008   Influenza Inj Mdck Quad Pf 07/20/2019, 08/20/2020   Influenza Nasal 07/19/2011   Influenza Split 07/28/2013, 08/23/2014, 08/07/2015   Influenza, Seasonal, Injecte, Preservative Fre 09/11/2009, 09/05/2010   Influenza,inj,Quad PF,6+ Mos 08/24/2017, 08/30/2018   Influenza,inj,quad, With Preservative 08/15/2016, 08/24/2017   Influenza-Unspecified 10/24/2008, 08/23/2014, 08/07/2015, 08/15/2016, 08/30/2018, 07/20/2019, 08/26/2020   Moderna Sars-Covid-2 Vaccination 12/27/2019, 01/24/2020, 11/13/2020   Pneumococcal Conjugate-13 08/23/2014   Pneumococcal Polysaccharide-23 08/30/2015   Tdap 06/29/2013   Zoster Recombinat (Shingrix) 06/24/2021, 09/09/2021    Health Maintenance  Topic Date  Due   COVID-19 Vaccine (4 - Moderna risk series) 01/08/2021   DEXA SCAN  02/04/2022   MAMMOGRAM  04/12/2022   INFLUENZA VACCINE  02/15/2023 (Originally 06/17/2022)   TETANUS/TDAP  06/30/2023   Pneumonia Vaccine 78+ Years old  Completed   Hepatitis C Screening  Completed   Zoster Vaccines- Shingrix  Completed   HPV VACCINES  Aged Out   COLONOSCOPY (Pts 45-61yrs Insurance coverage will need to be confirmed)  Discontinued       Assessment  This is a routine wellness examination for [employer].  Health Maintenance: Due or Overdue Health Maintenance Due  Topic Date Due   COVID-19 Vaccine (4 - Moderna risk series) 01/08/2021   DEXA SCAN  02/04/2022   MAMMOGRAM  04/12/2022    Renee Maldonado does not need a referral for Community Assistance: Care Management:   no Social Work:    no Prescription Assistance:  no Nutrition/Diabetes Education:  no   Plan:  Personalized Goals  Goals Addressed             This Visit's Progress    Patient Stated       08/05/2022 AWV Goal: Exercise for General Health  Patient will verbalize understanding of the benefits of increased physical activity: Exercising regularly is important. It will improve your overall fitness, flexibility, and endurance. Regular exercise also will improve your overall health. It can help you control your weight, reduce stress, and improve your bone density. Over the next year, patient will increase physical activity as tolerated with a goal of at least 150 minutes of moderate physical activity per week.  You can tell that you are exercising at a moderate intensity if your heart starts beating faster and you start breathing faster but can still hold a conversation. Moderate-intensity exercise ideas include: Walking 1 mile (1.6 km) in about 15 minutes Biking Hiking Golfing Dancing Water aerobics Patient will verbalize understanding of everyday activities that increase physical activity by providing examples like the  following: Yard work, such as: Sales promotion account executive Gardening Washing windows or floors Patient will be able to explain general safety guidelines for exercising:  Before you start a new exercise program, talk with your health care provider. Do not exercise so much that you hurt yourself, feel dizzy, or get very short of breath. Wear comfortable clothes and wear shoes with good support. Drink plenty of water while you exercise to prevent dehydration or heat stroke. Work out until your breathing and your heartbeat get faster.        Personalized Health Maintenance & Screening Recommendations  Influenza vaccine Screening mammography Bone densitometry screening  Lung Cancer Screening Recommended: yes (Low Dose CT Chest recommended if Age 40-80 years, 20  pack-year currently smoking OR have quit w/in past 15 years) Hepatitis C Screening recommended: no HIV Screening recommended: no  Advanced Directives: Written information was not prepared per patient's request.  Referrals & Orders No orders of the defined types were placed in this encounter.   Follow-up Plan Follow-up with Claretta Fraise, MD as planned Schedule mammogram Dexa scan   I have personally reviewed and noted the following in the patient's chart:   Medical and social history Use of alcohol, tobacco or illicit drugs  Current medications and supplements Functional ability and status Nutritional status Physical activity Advanced directives List of other physicians Hospitalizations, surgeries, and ER visits in previous 12 months Vitals Screenings to include cognitive, depression, and falls Referrals and appointments  In addition, I have reviewed and discussed with Natalee Bowlds certain preventive protocols, quality metrics, and best practice recommendations. A written personalized care plan for preventive services as well as general  preventive health recommendations is available and can be mailed to the patient at her request.      Burnadette Pop  08/05/2022   Patient declined after visit summary

## 2022-08-05 NOTE — Progress Notes (Signed)
B12 given to patient and tolerated well.

## 2022-08-11 ENCOUNTER — Other Ambulatory Visit (HOSPITAL_COMMUNITY): Payer: Self-pay | Admitting: Otolaryngology

## 2022-08-11 DIAGNOSIS — C73 Malignant neoplasm of thyroid gland: Secondary | ICD-10-CM | POA: Diagnosis not present

## 2022-08-11 DIAGNOSIS — Z923 Personal history of irradiation: Secondary | ICD-10-CM | POA: Diagnosis not present

## 2022-08-11 DIAGNOSIS — E6609 Other obesity due to excess calories: Secondary | ICD-10-CM | POA: Diagnosis not present

## 2022-08-11 DIAGNOSIS — C3431 Malignant neoplasm of lower lobe, right bronchus or lung: Secondary | ICD-10-CM | POA: Insufficient documentation

## 2022-08-11 DIAGNOSIS — Z6836 Body mass index (BMI) 36.0-36.9, adult: Secondary | ICD-10-CM | POA: Diagnosis not present

## 2022-08-11 DIAGNOSIS — Z853 Personal history of malignant neoplasm of breast: Secondary | ICD-10-CM | POA: Diagnosis not present

## 2022-08-21 ENCOUNTER — Ambulatory Visit (HOSPITAL_COMMUNITY)
Admission: RE | Admit: 2022-08-21 | Discharge: 2022-08-21 | Disposition: A | Discharge status: Home / Self Care | Payer: Medicare HMO | Source: Ambulatory Visit | Attending: Otolaryngology | Admitting: Otolaryngology

## 2022-08-21 DIAGNOSIS — K229 Disease of esophagus, unspecified: Secondary | ICD-10-CM | POA: Diagnosis not present

## 2022-08-21 DIAGNOSIS — E041 Nontoxic single thyroid nodule: Secondary | ICD-10-CM | POA: Diagnosis not present

## 2022-08-21 DIAGNOSIS — C73 Malignant neoplasm of thyroid gland: Secondary | ICD-10-CM | POA: Diagnosis not present

## 2022-08-21 DIAGNOSIS — Z85850 Personal history of malignant neoplasm of thyroid: Secondary | ICD-10-CM | POA: Diagnosis not present

## 2022-08-21 MED ORDER — IOHEXOL 300 MG/ML  SOLN
75.0000 mL | Freq: Once | INTRAMUSCULAR | Status: AC | PRN
  Administered 2022-08-21: 75 mL via INTRAVENOUS

## 2022-08-26 ENCOUNTER — Telehealth: Payer: Self-pay | Admitting: Pulmonary Disease

## 2022-08-26 NOTE — Telephone Encounter (Signed)
Called patient but she was unavailable. Will call back later.

## 2022-08-29 NOTE — Telephone Encounter (Signed)
Called the pt and had to [facility] with her spouse

## 2022-09-01 DIAGNOSIS — R69 Illness, unspecified: Secondary | ICD-10-CM | POA: Diagnosis not present

## 2022-09-01 DIAGNOSIS — C73 Malignant neoplasm of thyroid gland: Secondary | ICD-10-CM | POA: Diagnosis not present

## 2022-09-08 ENCOUNTER — Ambulatory Visit (INDEPENDENT_AMBULATORY_CARE_PROVIDER_SITE_OTHER): Payer: Medicare HMO

## 2022-09-08 DIAGNOSIS — E538 Deficiency of other specified B group vitamins: Secondary | ICD-10-CM | POA: Diagnosis not present

## 2022-09-08 NOTE — Progress Notes (Signed)
Cyanocobalamin injection given to right deltoid.  Patient tolerated well. 

## 2022-09-09 ENCOUNTER — Ambulatory Visit (HOSPITAL_COMMUNITY)
Admission: RE | Admit: 2022-09-09 | Discharge: 2022-09-09 | Disposition: A | Discharge status: Home / Self Care | Payer: Medicare HMO | Source: Ambulatory Visit | Attending: Urology | Admitting: Urology

## 2022-09-09 DIAGNOSIS — C3431 Malignant neoplasm of lower lobe, right bronchus or lung: Secondary | ICD-10-CM | POA: Insufficient documentation

## 2022-09-09 DIAGNOSIS — R918 Other nonspecific abnormal finding of lung field: Secondary | ICD-10-CM | POA: Diagnosis not present

## 2022-09-09 DIAGNOSIS — C3490 Malignant neoplasm of unspecified part of unspecified bronchus or lung: Secondary | ICD-10-CM | POA: Diagnosis not present

## 2022-09-10 NOTE — Progress Notes (Signed)
Radiation Oncology         [phone] ________________________________  Name: Renee Maldonado MRN: [id number]  Date: 09/11/2022  DOB: May 14, 1946  Post Treatment Note  CC: Claretta Fraise, MD  Garner Nash, DO  Diagnosis:   76 y.o. female with NSCLC, adenocarcinoma of the right lower lobe lung, Stage IA.  Interval Since Last Radiation:  3 months  06/02/22 - 06/09/22:   The target in the RLL lung was treated to 54 Gy in 3 fractions of 18 Gy  Narrative:  I spoke with the patient to conduct her routine scheduled 3 month follow up visit to review results of her recent CT chest scan via telephone to spare the patient unnecessary potential exposure in the healthcare setting during the current COVID-19 pandemic.  The patient was notified in advance and gave permission to proceed with this visit format.  She tolerated radiation treatment relatively well with only mild fatigue.                              On review of systems, the patient states that she is doing well in general and remains without complaints. She specifically denies dysphagia, shortness of breath, productive cough, chest pain or hemoptysis. She reports a healthy appetite and is maintaining her weight. No recent fevers, chills or night sweats.  She was also diagnosed with papillary thyroid carcinoma around the same time as her lung cancer diagnosis and was seen by Dr. Fredric Dine on 04/01/22 to discuss treatment. The recommendation was for surgical resection, likely hemithyroidectomy, to be scheduled after completing her lung treatments. She has been quite anxious about the delay in treating the thyroid cancer and was seen in consult with Dr. Sabino Gasser on 08/11/22. Dr. Sabino Gasser recommended right thyroid lobectomy with right central neck dissection vs total thyroidectomy pending discussion with endocrinologist, Dr. Denton Lank. She met with Dr. Denton Lank in Endocrinology at [facility] on 09/01/22 and the consensus recommendation was to proceed with a right  thyroid lobectomy and should there be any worrisome signs intraoperatively for extracapsular extension or lymphadenopathy, surgery may be modified to include a right central neck dissection and or total thyroidectomy.  She is scheduled for this procedure on 10/10/22.  She also continues in routine follow up with Dr. Loanne Drilling for her history of breast cancer.   ALLERGIES:  is allergic to benadryl [diphenhydramine hcl] and penicillins.  Meds: Current Outpatient Medications  Medication Sig Dispense Refill   acetaminophen (TYLENOL) 500 MG tablet Take 500 mg by mouth every 6 (six) hours as needed for moderate pain.      albuterol (VENTOLIN HFA) 108 (90 Base) MCG/ACT inhaler 2 puffs in lungs every 6 hours as needed for wheezing/short of breath. 8.5 g 5   amLODipine (NORVASC) 5 MG tablet Take 1 tablet (5 mg total) by mouth daily. 90 tablet 3   anastrozole (ARIMIDEX) 1 MG tablet Take 1 mg by mouth daily.      aspirin EC 325 MG tablet Take 650 mg by mouth daily as needed for moderate pain.     Calcium Carb-Cholecalciferol 600-20 MG-MCG TABS Take 1 tablet by mouth daily.     chlorpheniramine (CHLOR-TRIMETON) 4 MG tablet Take 4 mg by mouth in the morning and at bedtime.      cholecalciferol (VITAMIN D3) 25 MCG (1000 UNIT) tablet Take 1,000 Units by mouth daily.     cyanocobalamin (VITAMIN B12) 1000 MCG/ML injection Inject 1 mL (1,000 mcg total) into the  skin every 30 (thirty) days. 1 mL 11   famotidine (PEPCID) 40 MG tablet Take 1 tablet (40 mg total) by mouth daily. 90 tablet 2   fluticasone furoate-vilanterol (BREO ELLIPTA) 200-25 MCG/ACT AEPB Inhale 1 puff into the lungs daily. 1 each 11   hydrochlorothiazide (HYDRODIURIL) 25 MG tablet Take 1 tablet (25 mg total) by mouth daily. 90 tablet 3   hydrOXYzine (ATARAX/VISTARIL) 25 MG tablet Take 25 mg by mouth every 8 (eight) hours as needed for itching.     Melatonin 5 MG CAPS Take 5 mg by mouth at bedtime.     raloxifene (EVISTA) 60 MG tablet Take 1 tablet (60  mg total) by mouth daily. 90 tablet 2   RESTASIS 0.05 % ophthalmic emulsion Place 1 drop into both eyes 2 (two) times daily as needed (dry eyes).      rosuvastatin (CRESTOR) 20 MG tablet TAKE 1 TABLET DAILY 90 tablet 0   traZODone (DESYREL) 100 MG tablet TAKE 1 TABLET AT BEDTIME 90 tablet 3   valACYclovir (VALTREX) 500 MG tablet TAKE (1) TABLET DAILY AS NEEDED. 30 tablet 2   Current Facility-Administered Medications  Medication Dose Route Frequency Provider Last Rate Last Admin   cyanocobalamin ((VITAMIN B-12)) injection 1,000 mcg  1,000 mcg Intramuscular Q30 days Claretta Fraise, MD   1,000 mcg at 09/08/22 1011    Physical Findings:  vitals were not taken for this visit.   /10 Unable to assess due to telephone follow up visit format.  Lab Findings: Lab Results  Component Value Date   WBC 5.3 07/14/2022   HGB 12.0 07/14/2022   HCT 36.8 07/14/2022   MCV 83 07/14/2022   PLT 274 07/14/2022     Radiographic Findings: CT SOFT TISSUE NECK W CONTRAST  Result Date: 08/21/2022 CLINICAL DATA:  Patient has a history of papillary thyroid cancer. There is a nodule seen on prior PET-CT between the esophagus in the trachea. EXAM: CT NECK WITH CONTRAST TECHNIQUE: Multidetector CT imaging of the neck was performed using the standard protocol following the bolus administration of intravenous contrast. RADIATION DOSE REDUCTION: This exam was performed according to the departmental dose-optimization program which includes automated exposure control, adjustment of the mA and/or kV according to patient size and/or use of iterative reconstruction technique. CONTRAST:  19m OMNIPAQUE IOHEXOL 300 MG/ML  SOLN COMPARISON:  PET CT 04/30/22 FINDINGS: Pharynx and larynx: Normal. No mass or swelling. Salivary glands: No inflammation, mass, or stone. Thyroid: Redemonstrated is a contrast-enhancing nodule along the posterior margin of the tracheoesophageal groove measuring 2.1 x 1.6 cm, unchanged compared to comparison  PET CT dated 04/30/22 and prior CT neck datd 01/03/22. This mass abuts and displaces the esophagus without definitively invading it. Of note this lesion was FDG avid on the comparison PET-CT. There is also a 1.1 cm hypodense nodule in the right thyroid lobe and an additional 1.1 cm nodule at the thyroid isthmus. Lymph nodes: None enlarged or abnormal density. Unchanged 6 mm left level 5 lymph node (series 3, image 90). Vascular: Negative. Limited intracranial: Negative. Visualized orbits: Negative. Mastoids and visualized paranasal sinuses: Polypoid mucosal thickening in the right maxillary sinus. Skeleton: No acute or aggressive process. Upper chest: Redemonstrated is a right upper lobe ground-glass pulmonary nodule (series 3, image 118) measuring up to 1.2 cm, unchanged compared to prior exam. There is an additional 4 mm pulmonary nodule in the left upper lobe which is also unchanged (series 3, image 135). No evidence of axillary lymphadenopathy. Other: None. IMPRESSION: 1.  Redemonstrated exophytic nodule arising from the posterior right thyroid lobe, which was likely previously biopsied on 03/13/22. This is unchanged compared to comparison PET-CT dated 04/30/22. 2. No evidence of metastatic disease in the neck. 3. Unchanged 1.2 cm ground-glass pulmonary nodule in the right upper lobe and 4 mm pulmonary nodule in the left upper lobe. Electronically Signed   By: Marin Roberts M.D.   On: 08/21/2022 10:03    Impression/Plan: 1. 76 y.o. female with newly diagnosed NSCLC, adenocarcinoma of the right lower lobe lung, Stage IA. She has recovered well from the effects of her recent SBRT lung treatment and remains without complaints. Her post-treatment CT Chest scan from 09/09/22 shows *** .  Therefore, we will proceed with serial CT Chest scans every 6 months to monitor for any evidence of disease recurrence or progression. I will call her to review the results of each scan but she knows that she is welcome to call at any  time in the interim with questions or concerns.  She will proceed with the right thyroid lobectomy, as scheduled, on 10/10/2022 under the care and direction of Dr. Sabino Gasser and will also continue in routine follow up with Dr. Loanne Drilling for her history of breast cancer.     Nicholos Johns, PA-C

## 2022-09-11 ENCOUNTER — Ambulatory Visit
Admission: RE | Admit: 2022-09-11 | Discharge: 2022-09-11 | Disposition: A | Discharge status: Home / Self Care | Payer: Medicare HMO | Source: Ambulatory Visit | Attending: Urology | Admitting: Urology

## 2022-09-11 ENCOUNTER — Encounter: Payer: Self-pay | Admitting: Urology

## 2022-09-11 DIAGNOSIS — Z87891 Personal history of nicotine dependence: Secondary | ICD-10-CM | POA: Diagnosis not present

## 2022-09-11 DIAGNOSIS — C3431 Malignant neoplasm of lower lobe, right bronchus or lung: Secondary | ICD-10-CM

## 2022-09-11 NOTE — Progress Notes (Signed)
Telephone appointment to review CT results for 76 y.o. female with NSCLC, adenocarcinoma of the right lower lobe lung, Stage IA.   I verified patient's identity and began nursing interview. Patient reports moderate SOB w/ exertion, dizziness upon standing and high stress. Otherwise patient states "She is doing very well."    Meaningful use complete.   Patient aware of their 2:30pm-09/11/22 telephone appointment w/ Ashlyn Bruning PA-C. I left my extension [phone] in case patient needs anything. Patient verbalized understanding. This concludes the nursing interview.   Patient contact [phone]     Leandra Kern, LPN

## 2022-09-26 DIAGNOSIS — K219 Gastro-esophageal reflux disease without esophagitis: Secondary | ICD-10-CM | POA: Diagnosis not present

## 2022-09-26 DIAGNOSIS — Z923 Personal history of irradiation: Secondary | ICD-10-CM | POA: Diagnosis not present

## 2022-09-26 DIAGNOSIS — C3431 Malignant neoplasm of lower lobe, right bronchus or lung: Secondary | ICD-10-CM | POA: Diagnosis not present

## 2022-09-26 DIAGNOSIS — C73 Malignant neoplasm of thyroid gland: Secondary | ICD-10-CM | POA: Diagnosis not present

## 2022-09-28 DIAGNOSIS — K219 Gastro-esophageal reflux disease without esophagitis: Secondary | ICD-10-CM | POA: Insufficient documentation

## 2022-10-01 NOTE — Pre-Procedure Instructions (Signed)
Surgical Instructions    Your procedure is scheduled on October 10, 2022.  Report to [facility] Main Entrance "A" at 6:30 A.M., then check in with the Admitting office.  Call this number if you have problems the morning of surgery:  [phone]   If you have any questions prior to your surgery date call [phone]: Open Monday-Friday 8am-4pm    Remember:  Do not eat after midnight the night before your surgery  You may drink clear liquids until 5:30 AM the morning of your surgery.   Clear liquids allowed are: Water, Non-Citrus Juices (without pulp), Carbonated Beverages, Clear Tea, Black Coffee Only (NO MILK, CREAM OR POWDERED CREAMER of any kind), and Gatorade.     Take these medicines the morning of surgery with A SIP OF WATER:  amLODipine (NORVASC)   anastrozole (ARIMIDEX)   chlorpheniramine   famotidine (PEPCID)   fluticasone furoate-vilanterol (BREO ELLIPTA)   raloxifene (EVISTA)     Take these medicines the morning of surgery with a sip of water AS NEEDED:  acetaminophen (TYLENOL)   albuterol (VENTOLIN HFA) inhaler   hydrOXYzine (ATARAX/VISTARIL)   Polyethyl Glycol-Propyl Glycol (LUBRICANT EYE DROPS)   RESTASIS   sodium chloride (OCEAN) nasal spray   valACYclovir (VALTREX)    As of today, STOP taking any Aspirin (unless otherwise instructed by your surgeon) Aleve, Naproxen, Ibuprofen, Motrin, Advil, Goody's, BC's, all herbal medications, fish oil, and all vitamins.                     Do NOT Smoke (Tobacco/Vaping) for 24 hours prior to your procedure.  If you use a CPAP at night, you may bring your mask/headgear for your overnight stay.   Contacts, glasses, piercing's, hearing aid's, dentures or partials may not be worn into surgery, please bring cases for these belongings.    For patients admitted to the hospital, discharge time will be determined by your treatment team.   Patients discharged the day of surgery will not be allowed to drive home, and someone  needs to stay with them for 24 hours.  SURGICAL WAITING ROOM VISITATION Patients having surgery or a procedure may have no more than 2 support people in the waiting area - these visitors may rotate.   Children under the age of 83 must have an adult with them who is not the patient. If the patient needs to stay at the hospital during part of their recovery, the visitor guidelines for inpatient rooms apply. Pre-op nurse will coordinate an appropriate time for 1 support person to accompany patient in pre-op.  This support person may not rotate.   Please refer to the [facility] website for the visitor guidelines for Inpatients (after your surgery is over and you are in a regular room).    Special instructions:   [facility]- Preparing For Surgery  Before surgery, you can play an important role. Because skin is not sterile, your skin needs to be as free of germs as possible. You can reduce the number of germs on your skin by washing with CHG (chlorahexidine gluconate) Soap before surgery.  CHG is an antiseptic cleaner which kills germs and bonds with the skin to continue killing germs even after washing.    Oral Hygiene is also important to reduce your risk of infection.  Remember - BRUSH YOUR TEETH THE MORNING OF SURGERY WITH YOUR REGULAR TOOTHPASTE  Please do not use if you have an allergy to CHG or antibacterial soaps. If your skin becomes reddened/irritated  stop using the CHG.  Do not shave (including legs and underarms) for at least 48 hours prior to first CHG shower. It is OK to shave your face.  Please follow these instructions carefully.   Shower the NIGHT BEFORE SURGERY and the MORNING OF SURGERY  If you chose to wash your hair, wash your hair first as usual with your normal shampoo.  After you shampoo, rinse your hair and body thoroughly to remove the shampoo.  Use CHG Soap as you would any other liquid soap. You can apply CHG directly to the skin and wash gently with a scrungie or  a clean washcloth.   Apply the CHG Soap to your body ONLY FROM THE NECK DOWN.  Do not use on open wounds or open sores. Avoid contact with your eyes, ears, mouth and genitals (private parts). Wash Face and genitals (private parts)  with your normal soap.   Wash thoroughly, paying special attention to the area where your surgery will be performed.  Thoroughly rinse your body with warm water from the neck down.  DO NOT shower/wash with your normal soap after using and rinsing off the CHG Soap.  Pat yourself dry with a CLEAN TOWEL.  Wear CLEAN PAJAMAS to bed the night before surgery  Place CLEAN SHEETS on your bed the night before your surgery  DO NOT SLEEP WITH PETS.   Day of Surgery: Take a shower with CHG soap. Do not wear jewelry or makeup Do not wear lotions, powders, perfumes/colognes, or deodorant. Do not shave 48 hours prior to surgery.  Men may shave face and neck. Do not bring valuables to the hospital.  [facility] is not responsible for any belongings or valuables. Do not wear nail polish, gel polish, artificial nails, or any other type of covering on natural nails (fingers and toes) If you have artificial nails or gel coating that need to be removed by a nail salon, please have this removed prior to surgery. Artificial nails or gel coating may interfere with anesthesia's ability to adequately monitor your vital signs.  Wear Clean/Comfortable clothing the morning of surgery Remember to brush your teeth WITH YOUR REGULAR TOOTHPASTE.   Please read over the following fact sheets that you were given.    If you received a COVID test during your pre-op visit  it is requested that you wear a mask when out in public, stay away from anyone that may not be feeling well and notify your surgeon if you develop symptoms. If you have been in contact with anyone that has tested positive in the last 10 days please notify you surgeon.

## 2022-10-02 ENCOUNTER — Encounter (HOSPITAL_COMMUNITY)
Admission: RE | Admit: 2022-10-02 | Discharge: 2022-10-02 | Disposition: A | Discharge status: Home / Self Care | Payer: Medicare HMO | Source: Ambulatory Visit | Attending: Otolaryngology | Admitting: Otolaryngology

## 2022-10-02 ENCOUNTER — Other Ambulatory Visit: Payer: Self-pay | Admitting: Otolaryngology

## 2022-10-02 ENCOUNTER — Encounter (HOSPITAL_COMMUNITY): Payer: Self-pay

## 2022-10-02 ENCOUNTER — Other Ambulatory Visit: Payer: Self-pay

## 2022-10-02 VITALS — BP 153/78 | HR 81 | Temp 97.4°F | Resp 18 | Ht 61.0 in | Wt 187.6 lb

## 2022-10-02 DIAGNOSIS — I2510 Atherosclerotic heart disease of native coronary artery without angina pectoris: Secondary | ICD-10-CM | POA: Diagnosis not present

## 2022-10-02 DIAGNOSIS — Z01812 Encounter for preprocedural laboratory examination: Secondary | ICD-10-CM | POA: Insufficient documentation

## 2022-10-02 DIAGNOSIS — Z01818 Encounter for other preprocedural examination: Secondary | ICD-10-CM

## 2022-10-02 LAB — CBC
HCT: 40.7 % (ref 36.0–46.0)
Hemoglobin: 13.7 g/dL (ref 12.0–15.0)
MCH: 27.3 pg (ref 26.0–34.0)
MCHC: 33.7 g/dL (ref 30.0–36.0)
MCV: 81.2 fL (ref 80.0–100.0)
Platelets: 297 10*3/uL (ref 150–400)
RBC: 5.01 MIL/uL (ref 3.87–5.11)
RDW: 13.2 % (ref 11.5–15.5)
WBC: 5.4 10*3/uL (ref 4.0–10.5)
nRBC: 0 % (ref 0.0–0.2)

## 2022-10-02 LAB — BASIC METABOLIC PANEL
Anion gap: 9 (ref 5–15)
BUN: 19 mg/dL (ref 8–23)
CO2: 28 mmol/L (ref 22–32)
Calcium: 10.2 mg/dL (ref 8.9–10.3)
Chloride: 101 mmol/L (ref 98–111)
Creatinine, Ser: 1.36 mg/dL — ABNORMAL HIGH (ref 0.44–1.00)
GFR, Estimated: 40 mL/min — ABNORMAL LOW (ref >60)
Glucose, Bld: 120 mg/dL — ABNORMAL HIGH (ref 70–99)
Potassium: 3.2 mmol/L — ABNORMAL LOW (ref 3.5–5.1)
Sodium: 138 mmol/L (ref 135–145)

## 2022-10-02 NOTE — Progress Notes (Signed)
PCP - Dr. Claretta Fraise Cardiologist - Denies  PPM/ICD - Denies Device Orders - n/a Rep Notified - n/a  Chest x-ray - 04/15/2022 EKG - 04/15/2022 Stress Test - Denies ECHO - Denies Cardiac Cath - Denies  Sleep Study - Denies CPAP - n/a  No DM  Last dose of GLP1 agonist- n/a GLP1 instructions: n/a  Blood Thinner Instructions: n/a Aspirin Instructions:n/a  ERAS Protcol - Yes. Clear liquids until 0530 morning of surgery PRE-SURGERY Ensure or G2- n/a  COVID TEST- n/a   Anesthesia review: No.   Patient denies shortness of breath, fever, cough and chest pain at PAT appointment   All instructions explained to the patient, with a verbal understanding of the material. Patient agrees to go over the instructions while at home for a better understanding. Patient also instructed to self quarantine after being tested for COVID-19. The opportunity to ask questions was provided.

## 2022-10-08 ENCOUNTER — Ambulatory Visit: Payer: Medicare HMO

## 2022-10-10 ENCOUNTER — Encounter (HOSPITAL_COMMUNITY)
Admission: RE | Disposition: A | Discharge status: Home / Self Care | Payer: Self-pay | Source: Home / Self Care | Attending: Otolaryngology

## 2022-10-10 ENCOUNTER — Ambulatory Visit (HOSPITAL_COMMUNITY): Payer: Medicare HMO | Admitting: Anesthesiology

## 2022-10-10 ENCOUNTER — Other Ambulatory Visit: Payer: Self-pay

## 2022-10-10 ENCOUNTER — Observation Stay (HOSPITAL_COMMUNITY)
Admission: RE | Admit: 2022-10-10 | Discharge: 2022-10-11 | Disposition: A | Discharge status: Home / Self Care | Payer: Medicare HMO | Attending: Otolaryngology | Admitting: Otolaryngology

## 2022-10-10 ENCOUNTER — Ambulatory Visit (HOSPITAL_BASED_OUTPATIENT_CLINIC_OR_DEPARTMENT_OTHER): Payer: Medicare HMO | Admitting: Anesthesiology

## 2022-10-10 ENCOUNTER — Encounter (HOSPITAL_COMMUNITY): Payer: Self-pay | Admitting: Otolaryngology

## 2022-10-10 DIAGNOSIS — Z87891 Personal history of nicotine dependence: Secondary | ICD-10-CM | POA: Diagnosis not present

## 2022-10-10 DIAGNOSIS — I10 Essential (primary) hypertension: Secondary | ICD-10-CM | POA: Insufficient documentation

## 2022-10-10 DIAGNOSIS — Z7982 Long term (current) use of aspirin: Secondary | ICD-10-CM | POA: Insufficient documentation

## 2022-10-10 DIAGNOSIS — Z79899 Other long term (current) drug therapy: Secondary | ICD-10-CM | POA: Diagnosis not present

## 2022-10-10 DIAGNOSIS — C73 Malignant neoplasm of thyroid gland: Principal | ICD-10-CM | POA: Insufficient documentation

## 2022-10-10 DIAGNOSIS — J449 Chronic obstructive pulmonary disease, unspecified: Secondary | ICD-10-CM | POA: Insufficient documentation

## 2022-10-10 DIAGNOSIS — Z96653 Presence of artificial knee joint, bilateral: Secondary | ICD-10-CM | POA: Diagnosis not present

## 2022-10-10 DIAGNOSIS — Z7951 Long term (current) use of inhaled steroids: Secondary | ICD-10-CM | POA: Diagnosis not present

## 2022-10-10 DIAGNOSIS — E041 Nontoxic single thyroid nodule: Secondary | ICD-10-CM | POA: Diagnosis not present

## 2022-10-10 HISTORY — PX: RADICAL NECK DISSECTION: SHX2284

## 2022-10-10 HISTORY — PX: THYROIDECTOMY: SHX17

## 2022-10-10 SURGERY — THYROIDECTOMY
Anesthesia: General | Site: Neck | Laterality: Right

## 2022-10-10 MED ORDER — PHENYLEPHRINE HCL (PRESSORS) 10 MG/ML IV SOLN
INTRAVENOUS | Status: DC | PRN
  Administered 2022-10-10 (×7): 80 ug via INTRAVENOUS

## 2022-10-10 MED ORDER — KETAMINE HCL 10 MG/ML IJ SOLN
INTRAMUSCULAR | Status: DC | PRN
  Administered 2022-10-10: 40 mg via INTRAVENOUS

## 2022-10-10 MED ORDER — IBUPROFEN 200 MG PO TABS: 400.0000 mg | ORAL_TABLET | Freq: Four times a day (QID) | ORAL | 0 refills | Status: AC | PRN

## 2022-10-10 MED ORDER — LIDOCAINE-EPINEPHRINE 1 %-1:100000 IJ SOLN
INTRAMUSCULAR | Status: DC | PRN
  Administered 2022-10-10: 3 mL

## 2022-10-10 MED ORDER — SUCCINYLCHOLINE CHLORIDE 200 MG/10ML IV SOSY
PREFILLED_SYRINGE | INTRAVENOUS | Status: DC | PRN
  Administered 2022-10-10: 140 mg via INTRAVENOUS

## 2022-10-10 MED ORDER — HYDROCHLOROTHIAZIDE 25 MG PO TABS
25.0000 mg | ORAL_TABLET | Freq: Every day | ORAL | Status: DC
  Administered 2022-10-10: 25 mg via ORAL
  Filled 2022-10-10: qty 1

## 2022-10-10 MED ORDER — LIDOCAINE-EPINEPHRINE 1 %-1:100000 IJ SOLN
INTRAMUSCULAR | Status: AC
  Filled 2022-10-10: qty 1

## 2022-10-10 MED ORDER — FENTANYL CITRATE (PF) 250 MCG/5ML IJ SOLN
INTRAMUSCULAR | Status: DC | PRN
  Administered 2022-10-10 (×3): 50 ug via INTRAVENOUS

## 2022-10-10 MED ORDER — DEXAMETHASONE SODIUM PHOSPHATE 10 MG/ML IJ SOLN
INTRAMUSCULAR | Status: DC | PRN
  Administered 2022-10-10: 10 mg via INTRAVENOUS

## 2022-10-10 MED ORDER — 0.9 % SODIUM CHLORIDE (POUR BTL) OPTIME
TOPICAL | Status: DC | PRN
  Administered 2022-10-10: 1000 mL

## 2022-10-10 MED ORDER — OXYCODONE HCL 5 MG/5ML PO SOLN: 5.0000 mg | Freq: Once | ORAL | Status: DC | PRN

## 2022-10-10 MED ORDER — LACTATED RINGERS IV SOLN: INTRAVENOUS | Status: DC

## 2022-10-10 MED ORDER — ONDANSETRON HCL 4 MG/2ML IJ SOLN
INTRAMUSCULAR | Status: AC
  Filled 2022-10-10: qty 2

## 2022-10-10 MED ORDER — ACETAMINOPHEN 325 MG PO TABS: 325.0000 mg | ORAL_TABLET | ORAL | Status: DC | PRN

## 2022-10-10 MED ORDER — MIDAZOLAM HCL 5 MG/5ML IJ SOLN
INTRAMUSCULAR | Status: DC | PRN
  Administered 2022-10-10: 1 mg via INTRAVENOUS

## 2022-10-10 MED ORDER — FENTANYL CITRATE (PF) 250 MCG/5ML IJ SOLN
INTRAMUSCULAR | Status: AC
  Filled 2022-10-10: qty 5

## 2022-10-10 MED ORDER — ACETAMINOPHEN 10 MG/ML IV SOLN
INTRAVENOUS | Status: DC | PRN
  Administered 2022-10-10: 1000 mg via INTRAVENOUS

## 2022-10-10 MED ORDER — ACETAMINOPHEN 500 MG PO TABS: 500.0000 mg | ORAL_TABLET | Freq: Four times a day (QID) | ORAL | 0 refills | Status: AC | PRN

## 2022-10-10 MED ORDER — PROPOFOL 10 MG/ML IV BOLUS
INTRAVENOUS | Status: AC
  Filled 2022-10-10: qty 20

## 2022-10-10 MED ORDER — LIDOCAINE 2% (20 MG/ML) 5 ML SYRINGE
INTRAMUSCULAR | Status: DC | PRN
  Administered 2022-10-10: 100 mg via INTRAVENOUS

## 2022-10-10 MED ORDER — ACETAMINOPHEN 160 MG/5ML PO SOLN: 325.0000 mg | ORAL | Status: DC | PRN

## 2022-10-10 MED ORDER — ACETAMINOPHEN 325 MG PO TABS: 650.0000 mg | ORAL_TABLET | ORAL | Status: DC | PRN

## 2022-10-10 MED ORDER — HEMOSTATIC AGENTS (NO CHARGE) OPTIME
TOPICAL | Status: DC | PRN
  Administered 2022-10-10: 1 via TOPICAL

## 2022-10-10 MED ORDER — ORAL CARE MOUTH RINSE: 15.0000 mL | Freq: Once | OROMUCOSAL | Status: AC

## 2022-10-10 MED ORDER — KETAMINE HCL 50 MG/5ML IJ SOSY
PREFILLED_SYRINGE | INTRAMUSCULAR | Status: AC
  Filled 2022-10-10: qty 5

## 2022-10-10 MED ORDER — IBUPROFEN 400 MG PO TABS
400.0000 mg | ORAL_TABLET | Freq: Four times a day (QID) | ORAL | Status: DC | PRN
  Administered 2022-10-10: 400 mg via ORAL
  Filled 2022-10-10: qty 1

## 2022-10-10 MED ORDER — DEXTROSE-NACL 5-0.45 % IV SOLN: INTRAVENOUS | Status: DC

## 2022-10-10 MED ORDER — AMLODIPINE BESYLATE 5 MG PO TABS
5.0000 mg | ORAL_TABLET | Freq: Every day | ORAL | Status: DC
  Administered 2022-10-11: 5 mg via ORAL
  Filled 2022-10-10: qty 1

## 2022-10-10 MED ORDER — ONDANSETRON HCL 4 MG/2ML IJ SOLN: 4.0000 mg | Freq: Once | INTRAMUSCULAR | Status: DC | PRN

## 2022-10-10 MED ORDER — BUPIVACAINE HCL (PF) 0.25 % IJ SOLN
INTRAMUSCULAR | Status: AC
  Filled 2022-10-10: qty 30

## 2022-10-10 MED ORDER — OXYCODONE HCL 5 MG PO TABS: 5.0000 mg | ORAL_TABLET | Freq: Four times a day (QID) | ORAL | 0 refills | Status: AC | PRN

## 2022-10-10 MED ORDER — ACETAMINOPHEN 10 MG/ML IV SOLN
INTRAVENOUS | Status: AC
  Filled 2022-10-10: qty 100

## 2022-10-10 MED ORDER — CHLORHEXIDINE GLUCONATE 0.12 % MT SOLN
15.0000 mL | Freq: Once | OROMUCOSAL | Status: AC
  Administered 2022-10-10: 15 mL via OROMUCOSAL
  Filled 2022-10-10: qty 15

## 2022-10-10 MED ORDER — DOCUSATE SODIUM 100 MG PO CAPS
100.0000 mg | ORAL_CAPSULE | Freq: Two times a day (BID) | ORAL | Status: DC
  Administered 2022-10-10 (×2): 100 mg via ORAL
  Filled 2022-10-10 (×2): qty 1

## 2022-10-10 MED ORDER — FENTANYL CITRATE (PF) 100 MCG/2ML IJ SOLN: 25.0000 ug | INTRAMUSCULAR | Status: DC | PRN

## 2022-10-10 MED ORDER — MEPERIDINE HCL 25 MG/ML IJ SOLN: 6.2500 mg | INTRAMUSCULAR | Status: DC | PRN

## 2022-10-10 MED ORDER — PROPOFOL 500 MG/50ML IV EMUL
INTRAVENOUS | Status: DC | PRN
  Administered 2022-10-10: 40 ug/kg/min via INTRAVENOUS

## 2022-10-10 MED ORDER — PHENYLEPHRINE 80 MCG/ML (10ML) SYRINGE FOR IV PUSH (FOR BLOOD PRESSURE SUPPORT)
PREFILLED_SYRINGE | INTRAVENOUS | Status: AC
  Filled 2022-10-10: qty 10

## 2022-10-10 MED ORDER — SUCCINYLCHOLINE CHLORIDE 200 MG/10ML IV SOSY
PREFILLED_SYRINGE | INTRAVENOUS | Status: AC
  Filled 2022-10-10: qty 10

## 2022-10-10 MED ORDER — PROPOFOL 10 MG/ML IV BOLUS
INTRAVENOUS | Status: DC | PRN
  Administered 2022-10-10: 50 mg via INTRAVENOUS
  Administered 2022-10-10: 150 mg via INTRAVENOUS

## 2022-10-10 MED ORDER — DEXAMETHASONE SODIUM PHOSPHATE 10 MG/ML IJ SOLN
INTRAMUSCULAR | Status: AC
  Filled 2022-10-10: qty 1

## 2022-10-10 MED ORDER — VANCOMYCIN HCL IN DEXTROSE 1-5 GM/200ML-% IV SOLN
1000.0000 mg | INTRAVENOUS | Status: AC
  Administered 2022-10-10: 1000 mg via INTRAVENOUS
  Filled 2022-10-10: qty 200

## 2022-10-10 MED ORDER — OXYCODONE HCL 5 MG PO TABS: 5.0000 mg | ORAL_TABLET | Freq: Once | ORAL | Status: DC | PRN

## 2022-10-10 MED ORDER — ONDANSETRON HCL 4 MG/2ML IJ SOLN
INTRAMUSCULAR | Status: DC | PRN
  Administered 2022-10-10: 4 mg via INTRAVENOUS

## 2022-10-10 MED ORDER — ORAL CARE MOUTH RINSE: 15.0000 mL | OROMUCOSAL | Status: DC | PRN

## 2022-10-10 MED ORDER — MIDAZOLAM HCL 2 MG/2ML IJ SOLN
INTRAMUSCULAR | Status: AC
  Filled 2022-10-10: qty 2

## 2022-10-10 MED ORDER — OXYCODONE HCL 5 MG PO TABS: 5.0000 mg | ORAL_TABLET | Freq: Four times a day (QID) | ORAL | Status: DC | PRN

## 2022-10-10 MED ORDER — LIDOCAINE 2% (20 MG/ML) 5 ML SYRINGE
INTRAMUSCULAR | Status: AC
  Filled 2022-10-10: qty 5

## 2022-10-10 MED ORDER — CHLORHEXIDINE GLUCONATE 0.12 % MT SOLN: 15.0000 mL | Freq: Once | OROMUCOSAL | Status: AC

## 2022-10-10 SURGICAL SUPPLY — 53 items
ATTRACTOMAT 16X20 MAGNETIC DRP (DRAPES) IMPLANT
BAG COUNTER SPONGE SURGICOUNT (BAG) ×1 IMPLANT
BLADE SURG 15 STRL LF DISP TIS (BLADE) ×1 IMPLANT
BLADE SURG 15 STRL SS (BLADE) ×1
CANISTER SUCT 3000ML PPV (MISCELLANEOUS) ×1 IMPLANT
CLEANER TIP ELECTROSURG 2X2 (MISCELLANEOUS) ×1 IMPLANT
CLIP TI MEDIUM 24 (CLIP) ×1 IMPLANT
CLIP TI WIDE RED SMALL 24 (CLIP) ×1 IMPLANT
CNTNR URN SCR LID CUP LEK RST (MISCELLANEOUS) IMPLANT
CONT SPEC 4OZ STRL OR WHT (MISCELLANEOUS) ×1
CORD BIPOLAR FORCEPS 12FT (ELECTRODE) ×1 IMPLANT
COVER SURGICAL LIGHT HANDLE (MISCELLANEOUS) ×1 IMPLANT
DRAIN JP 15F RND RADIO PRF (DRAIN) IMPLANT
DRAIN WOUND SNY 15 RND (WOUND CARE) IMPLANT
DRAPE HALF SHEET 40X57 (DRAPES) IMPLANT
DRSG TEGADERM 4X4.75 (GAUZE/BANDAGES/DRESSINGS) IMPLANT
DRSG TELFA 3X8 NADH STRL (GAUZE/BANDAGES/DRESSINGS) IMPLANT
ELECT COATED BLADE 2.86 ST (ELECTRODE) ×1 IMPLANT
ELECT REM PT RETURN 9FT ADLT (ELECTROSURGICAL) ×1
ELECTRODE REM PT RTRN 9FT ADLT (ELECTROSURGICAL) ×1 IMPLANT
EVACUATOR SILICONE 100CC (DRAIN) IMPLANT
GAUZE 4X4 16PLY ~~LOC~~+RFID DBL (SPONGE) IMPLANT
GLOVE BIO SURGEON STRL SZ7.5 (GLOVE) ×1 IMPLANT
GLOVE BIOGEL PI IND STRL 8 (GLOVE) ×1 IMPLANT
GOWN STRL REUS W/ TWL LRG LVL3 (GOWN DISPOSABLE) ×1 IMPLANT
GOWN STRL REUS W/ TWL XL LVL3 (GOWN DISPOSABLE) ×1 IMPLANT
GOWN STRL REUS W/TWL LRG LVL3 (GOWN DISPOSABLE) ×1
GOWN STRL REUS W/TWL XL LVL3 (GOWN DISPOSABLE) ×1
HEMOSTAT SURGICEL 2X14 (HEMOSTASIS) IMPLANT
KIT BASIN OR (CUSTOM PROCEDURE TRAY) ×1 IMPLANT
KIT TURNOVER KIT B (KITS) ×1 IMPLANT
LOCATOR NERVE 3 VOLT (DISPOSABLE) IMPLANT
NDL PRECISIONGLIDE 27X1.5 (NEEDLE) ×1 IMPLANT
NEEDLE PRECISIONGLIDE 27X1.5 (NEEDLE) ×1 IMPLANT
NS IRRIG 1000ML POUR BTL (IV SOLUTION) ×1 IMPLANT
PAD ARMBOARD 7.5X6 YLW CONV (MISCELLANEOUS) ×2 IMPLANT
PENCIL SMOKE EVACUATOR (MISCELLANEOUS) ×1 IMPLANT
POSITIONER HEAD DONUT 9IN (MISCELLANEOUS) IMPLANT
SET WALTER ACTIVATION W/DRAPE (SET/KITS/TRAYS/PACK) IMPLANT
SPECIMEN JAR MEDIUM (MISCELLANEOUS) ×1 IMPLANT
SPONGE INTESTINAL PEANUT (DISPOSABLE) IMPLANT
SPONGE T-LAP 18X18 ~~LOC~~+RFID (SPONGE) ×1 IMPLANT
STAPLER VISISTAT 35W (STAPLE) ×1 IMPLANT
SUT ETHILON 3 0 PS 1 (SUTURE) IMPLANT
SUT SILK 2 0 SH CR/8 (SUTURE) ×1 IMPLANT
SUT SILK 3 0 REEL (SUTURE) ×1 IMPLANT
SUT VIC AB 3-0 SH 8-18 (SUTURE) IMPLANT
SUT VIC AB 4-0 PS2 18 (SUTURE) IMPLANT
TOWEL GREEN STERILE FF (TOWEL DISPOSABLE) ×1 IMPLANT
TRAY ENT MC OR (CUSTOM PROCEDURE TRAY) ×1 IMPLANT
TRAY FOLEY MTR SLVR 14FR STAT (SET/KITS/TRAYS/PACK) IMPLANT
TUBE ENDOTRAC EMG 7X10.2 (MISCELLANEOUS) IMPLANT
WATER STERILE IRR 1000ML POUR (IV SOLUTION) ×1 IMPLANT

## 2022-10-10 NOTE — Discharge Instructions (Addendum)
[facility] THYROID / PARATHYROID SURGERY Post Operative Instructions Office Number [phone]  The Surgery Itself Thyroid or Parathyroid surgery involves general anesthesia. Patients may be quite sedated for several hours after surgery and may remain sleepy for much of the day. Nausea and vomiting is occasionally seen, and usually resolves by the evening of surgery - even without additional medications. Some patients stay one night in the hospital and are discharged the next day. Other patients can go home the evening of surgery. Many patients will have a drain in place after surgery-this is removed the following day before you go home from the hospital or in the office 1-3  days after surgery.   Your Incision Your incision is closed with absorbable sutures and is covered with a small strip of tape or skin glue. You can shower and wash your hair as usual starting 24-48 hours after your drain is removed, as directed by your surgeon. If you did not have a drain in place after surgery, you may shower 24 hours after surgery. You may wash in a bathtub prior to that time if you are careful not to get your neck wet. Do not soak or scrub the incision. You might notice bruising around your incision or upper chest and slight swelling above the scar when you are upright. In addition, the scar may become pink and hard. This hardening will peak at about 3 weeks and may result in some tightness or difficulty swallowing, which will disappear over the next 2 to 3 months. You should apply sunscreen on your incision once directed by your surgeon (usually starting one month  after surgery) EVERY day for the first year after surgery. This will prevent a red or pink scar and give you the best cosmetic result for your scar. A daily moisturizer with sunscreen (example Oil of Olay with SPF 15) is fine.   Limitations You can start resuming normal activities as tolerated 7 days after surgery. For some patients, lifting  can cause pain and stretching at the surgery site for up to 2-3 weeks after surgery. You should not drive or drink alcohol while taking pain medications. Most people can return to work/school in 1-2 weeks after surgery, but there may be physical limitations as far as what you may do while at work.   Medications ? Pain medication should be used for pain as prescribed. Pain is expected after surgery. Your neck will be sore and pain will be worse when the neck is stretched and when you swallow. As the surgical site heals, pain will resolve over the course of a week. It is not uncommon for pain to get worse when you first go home because your activity may increase, but from that point on the pain should improve every day. Pain medications can cause nausea, which can be prevented if you take them with food or milk. ? You may be given a stool softener (Colace) because pain medications may make you  constipated. You can also use an over the counter stool softener.  ? You may be given Bacitracin ointment. If you are, it should be applied to the drain exit site three times a day for 2 days after the drain is removed. It should also be applied to the drain site before and after your first shower after surgery. It is normal to have some red or pink drainage from your drain exit site for 1-2 days after it is removed. ? If you were taking thyroid hormone tablets before  your operation, you will continue this after surgery, but sometimes your surgeon will change the dose. If you were not taking thyroid hormone prior to your operation, your surgeon may prescribe these tablets following surgery if the entire thyroid is removed. During your post-operative visits, you may have a blood test to measure your levels of thyroid hormone and your dose of medication may be adjusted accordingly. ? If you had parathyroid surgery or a total thyroidectomy, you may be instructed to take extra calcium supplements until your blood calcium  levels stabilize. These usually have to be purchased "over the counter" at a drug store and your surgeon will give you specific instructions. Generic brands are fine. Calcium carbonate with Vitamin D or TUMS are usually recommended. If you take any medications for gastric reflux (heartburn), you may be instructed to take Calcium Citrate with vitamin D instead of the other types of calcium. Your surgeon will instruct you on which type of calcium supplement to purchase and how many tablets to take each day after surgery. ? Take all of your routine medications as prescribed, unless told otherwise by your surgeon. Any medications which thin the blood should be avoided until your surgeon tells you to resume them.  ? IT IS OK TO TAKE OVER THE COUNTER PAIN MEDICATION (IBUPROFEN, NAPROXEN, or ACETAMINOPHEN) IN ADDITION TO YOUR PRESCRIBED MEDICATIONS. DO NOT TAKE ASPIRIN UNLESS CLEARED WITH YOUR SURGEON.  ? Limit Acetaminophen/Tylenol to less than 4,000mg /day  ? Limit Ibuprofen/Motrin to less than 3,600mg /day  Pain The main complaint following thyroid surgery is pain with swallowing and neck movement. Some people experience a dull ache, while others feel a sharp pain. This should not keep you from eating anything you want or moving your neck and will improve daily after surgery. It is normal to have a sore throat as well.   Voice Your voice may go through some temporary changes with fluctuations in volume and clarity (hoarseness). Generally, it will be better in the mornings and "tire" toward the end of the day. This can last for variable periods of time, but should clear in 8-10 weeks at most.   Cough You may feel like you have phlegm in your throat or a sore throat. This is usually because there was a tube in your windpipe while you were asleep that caused irritation that you perceive as phlegm. You will notice that if you cough, very little phlegm will come up. This should clear up in 4 to 5  days.  Hypocalcemia (Low blood calcium) In some patients who have thyroid and parathyroid surgery, the parathyroid glands do not function properly immediately after surgery. This is usually temporary and causes the blood calcium level to drop below normal (hypocalcemia). Symptoms of hypocalcemia include numbness and tingling of your lips (like they fell asleep), in your hands and in your feet. Some patients experience a "crawling" sensation in the skin, muscle cramps or headaches. These symptoms can appear between 24 and 72 hours after surgery. It is rare for them to start more than 72 hours after surgery. If this happens, take 2 extra calcium tablets. If the symptoms do not resolve within one hour, you should call your doctor. If this happens during the business day, call the nurse triage line. If this happens in the evening or over the weekend, call the on call physician or go to the ER so your blood calcium levels can be checked. You can take extra calcium supplements (which will help to resolve symptoms) as you  are contacting the clinic or coming to the ER. Taking extra calcium supplements if you do not need them will not cause you any harm.  Reasons to call your surgeon's office ? Persistent fever over 101 F ? Increasing neck swelling ? Numbness or tingling around your mouth or fingertips  ? Pain that is not relieved by your medications ? Purulent drainage (pus) from the incision ? Redness surrounding the incision that is worsening or getting bigger ? Bleeding is possible after surgery and the most serious cases may cause trouble breathing. Symptoms include rapid swelling in the neck, trouble breathing, red and purple discoloration of the skin over the incision. If breathing difficulty occurs with this rapid neck swelling, call the doctor immediately, go to the closest emergency room or call 911.  [facility] [phone] [address] [place], [place] [address] Phone: [phone]

## 2022-10-10 NOTE — Anesthesia Preprocedure Evaluation (Signed)
Anesthesia Evaluation  Patient identified by MRN, date of birth, ID band Patient awake    Reviewed: Allergy & Precautions, NPO status , Patient's Chart, lab work & pertinent test results  Airway Mallampati: II  TM Distance: >3 FB Neck ROM: Full    Dental no notable dental hx.    Pulmonary COPD,  COPD inhaler, former smoker   Pulmonary exam normal        Cardiovascular hypertension, Pt. on medications Normal cardiovascular exam     Neuro/Psych  PSYCHIATRIC DISORDERS Anxiety     negative neurological ROS     GI/Hepatic Neg liver ROS,GERD  Medicated and Controlled,,  Endo/Other  negative endocrine ROS    Renal/GU negative Renal ROS     Musculoskeletal  (+) Arthritis ,    Abdominal  (+) + obese  Peds  Hematology negative hematology ROS (+)   Anesthesia Other Findings lung nodules  Reproductive/Obstetrics                             Anesthesia Physical Anesthesia Plan  ASA: 3  Anesthesia Plan: General   Post-op Pain Management: Toradol IV (intra-op)*   Induction: Intravenous  PONV Risk Score and Plan: 3 and Ondansetron, Dexamethasone and Treatment may vary due to age or medical condition  Airway Management Planned: Oral ETT  Additional Equipment: None  Intra-op Plan:   Post-operative Plan: Extubation in OR  Informed Consent: I have reviewed the patients History and Physical, chart, labs and discussed the procedure including the risks, benefits and alternatives for the proposed anesthesia with the patient or authorized representative who has indicated his/her understanding and acceptance.     Dental advisory given  Plan Discussed with: CRNA and Anesthesiologist  Anesthesia Plan Comments:         Anesthesia Quick Evaluation

## 2022-10-10 NOTE — Anesthesia Procedure Notes (Addendum)
Procedure Name: Intubation Date/Time: 10/10/2022 9:03 AM  Performed by: Georgia Duff, CRNAPre-anesthesia Checklist: Patient identified, Emergency Drugs available, Suction available and Patient being monitored Patient Re-evaluated:Patient Re-evaluated prior to induction Oxygen Delivery Method: Circle system utilized Preoxygenation: Pre-oxygenation with 100% oxygen Induction Type: IV induction Ventilation: Mask ventilation without difficulty Laryngoscope Size: Glidescope and 3 Grade View: Grade I Tube type: Reinforced (nims) Tube size: 7.0 mm Number of attempts: 1 Airway Equipment and Method: Stylet, Video-laryngoscopy and Bite block Placement Confirmation: ETT inserted through vocal cords under direct vision, positive ETCO2 and breath sounds checked- equal and bilateral Secured at: 10 cm Tube secured with: Tape Dental Injury: Teeth and Oropharynx as per pre-operative assessment  Comments: Secured [employer] Reinforced EMG ETT under observation by ENT

## 2022-10-10 NOTE — H&P (Signed)
Renee Maldonado is an 76 y.o. female.    Chief Complaint:  Papillary thyroid carcinoma   HPI: Patient presents today for planned elective procedure.  He/she denies any interval change in history since office visit on 09/26/22  Past Medical History:  Diagnosis Date   Anxiety    Arthritis    Cancer ([place])    breast Right no chemo or radiation  on Arimidex   COPD (chronic obstructive pulmonary disease) ([place])    on Breo   GERD (gastroesophageal reflux disease)    Humerus fracture 2007   Hyperlipidemia    Hypertension    Insomnia    OA (osteoarthritis) of knee 10/08/2020   Thyroid cancer ([place]) 2023    Past Surgical History:  Procedure Laterality Date   BREAST LUMPECTOMY Right 02/2019   BREAST SURGERY Right 01/2019   breast biopsy   BRONCHIAL BIOPSY  04/15/2022   Procedure: BRONCHIAL BIOPSIES;  Surgeon: Garner Nash, DO;  Location: [facility];  Service: Pulmonary;;   BRONCHIAL BRUSHINGS  04/15/2022   Procedure: BRONCHIAL BRUSHINGS;  Surgeon: Garner Nash, DO;  Location: [facility];  Service: Pulmonary;;   BRONCHIAL NEEDLE ASPIRATION BIOPSY  04/15/2022   Procedure: BRONCHIAL NEEDLE ASPIRATION BIOPSIES;  Surgeon: Garner Nash, DO;  Location: [facility];  Service: Pulmonary;;   COLONOSCOPY  2022   FIDUCIAL MARKER PLACEMENT  04/15/2022   Procedure: FIDUCIAL MARKER PLACEMENT;  Surgeon: Garner Nash, DO;  Location: [facility];  Service: Pulmonary;;   JOINT REPLACEMENT     left knee   Left shoulder surgery     2007   TOTAL KNEE ARTHROPLASTY     2012 left   TOTAL KNEE ARTHROPLASTY Right 10/08/2020   Procedure: TOTAL KNEE ARTHROPLASTY;  Surgeon: Gaynelle Arabian, MD;  Location: [facility];  Service: Orthopedics;  Laterality: Right;   TUBAL LIGATION  1978   VIDEO BRONCHOSCOPY WITH RADIAL ENDOBRONCHIAL ULTRASOUND  04/15/2022   Procedure: VIDEO BRONCHOSCOPY WITH RADIAL ENDOBRONCHIAL ULTRASOUND;  Surgeon: Garner Nash, DO;  Location: [facility];  Service: Pulmonary;;     Family History  Problem Relation Age of Onset   Alzheimer's disease Mother    Diabetes Mother    Stroke Father    Hypertension Father    Cancer Brother     Social History:  reports that she quit smoking about 8 years ago. Her smoking use included cigarettes. She has a 10.00 pack-year smoking history. She has never used smokeless tobacco. She reports current alcohol use of about 4.0 standard drinks of alcohol per week. She reports that she does not use drugs.  Allergies:  Allergies  Allergen Reactions   Benadryl [Diphenhydramine Hcl] Nausea Only    Dizziness   Penicillins     Had a rash around 20 years ago but unsure if it come from medicine.   Tolerated Cephalosporin Date: 10/09/20.      Medications Prior to Admission  Medication Sig Dispense Refill   acetaminophen (TYLENOL) 500 MG tablet Take 500 mg by mouth every 6 (six) hours as needed for moderate pain.      amLODipine (NORVASC) 5 MG tablet Take 1 tablet (5 mg total) by mouth daily. 90 tablet 3   anastrozole (ARIMIDEX) 1 MG tablet Take 1 mg by mouth in the morning.     aspirin 500 MG tablet Take 500 mg by mouth every 6 (six) hours as needed (pain.).     Calcium Carb-Cholecalciferol 600-20 MG-MCG TABS Take 1 tablet by mouth in the morning.  chlorpheniramine (CHLOR-TRIMETON) 4 MG tablet Take 4 mg by mouth See admin instructions. Take 1 tablet (4 mg) by mouth scheduled every night & may take an additional dose in the morning if needed for allergies.     cholecalciferol (VITAMIN D3) 25 MCG (1000 UNIT) tablet Take 1,000 Units by mouth in the morning.     famotidine (PEPCID) 40 MG tablet Take 1 tablet (40 mg total) by mouth daily. 90 tablet 2   fluticasone furoate-vilanterol (BREO ELLIPTA) 200-25 MCG/ACT AEPB Inhale 1 puff into the lungs daily. (Patient taking differently: Inhale 1 puff into the lungs 4 (four) times a week.) 1 each 11   hydrochlorothiazide (HYDRODIURIL) 25 MG tablet Take 1 tablet (25 mg total) by mouth  daily. 90 tablet 3   hydrOXYzine (ATARAX/VISTARIL) 25 MG tablet Take 25 mg by mouth every 4 (four) hours as needed for itching.     L-Lysine 500 MG TABS Take 500 mg by mouth every evening.     Melatonin 10 MG CAPS Take 10 mg by mouth at bedtime.     Polyethyl Glycol-Propyl Glycol (LUBRICANT EYE DROPS) 0.4-0.3 % SOLN Place 1-2 drops into both eyes 3 (three) times daily as needed (dry/irritated eyes.).     raloxifene (EVISTA) 60 MG tablet Take 1 tablet (60 mg total) by mouth daily. 90 tablet 2   rosuvastatin (CRESTOR) 20 MG tablet TAKE 1 TABLET DAILY (Patient taking differently: Take 20 mg by mouth at bedtime.) 90 tablet 0   sodium chloride (OCEAN) 0.65 % SOLN nasal spray Place 1 spray into both nostrils as needed for congestion.     traZODone (DESYREL) 100 MG tablet TAKE 1 TABLET AT BEDTIME 90 tablet 3   valACYclovir (VALTREX) 500 MG tablet TAKE (1) TABLET DAILY AS NEEDED. (Patient taking differently: Take 500 mg by mouth daily as needed (fever blisters.).) 30 tablet 2   albuterol (VENTOLIN HFA) 108 (90 Base) MCG/ACT inhaler 2 puffs in lungs every 6 hours as needed for wheezing/short of breath. 8.5 g 5   cyanocobalamin (VITAMIN B12) 1000 MCG/ML injection Inject 1 mL (1,000 mcg total) into the skin every 30 (thirty) days. 1 mL 11   hydrocortisone cream 1 % Apply 1 Application topically daily as needed (dry/itchy skin on back).     RESTASIS 0.05 % ophthalmic emulsion Place 1 drop into both eyes 2 (two) times daily as needed (dry eyes).       No results found for this or any previous visit (from the past 48 hour(s)). No results found.  ROS: negative other than stated in HPI  Blood pressure (!) 153/76, pulse 83, temperature 98.6 F (37 C), temperature source Oral, resp. rate 20, SpO2 96 %.  PHYSICAL EXAM: General: Resting comfortably in NAD  Lungs: Non-labored respiratinos  Studies Reviewed:   CT Neck with contrast 08/21/22  IMPRESSION: 1. Redemonstrated exophytic nodule arising from the  posterior right thyroid lobe, which was likely previously biopsied on 03/13/22. This is unchanged compared to comparison PET-CT dated 04/30/22. 2. No evidence of metastatic disease in the neck. 3. Unchanged 1.2 cm ground-glass pulmonary nodule in the right upper lobe and 4 mm pulmonary nodule in the left upper lobe.     Electronically Signed   By: Marin Roberts M.D.   On: 08/21/2022 10:03   NON GYN CYTOLOGY - NON PAP CASE: [id number] PATIENT: Ursula Giammarco Non-Gynecological Cytology Report     Clinical History: 1.5 cm RLL Specimen Submitted:  A. THYROID, RLL, FINE NEEDLE ASPIRATION:   FINAL MICROSCOPIC DIAGNOSIS: -  Findings consistent with papillary carcinoma (Bethesda category VI)    Assessment/Plan T1 Papillary thyroid carcinoma right thyroid lobe  Proceed with right thyroid lobectomy, possible total thyroidectomy, possible right central neck dissection with overnight observation.  Informed consent obtained. R/B/A discussed. Risks discussed in detail including pain, bleeding, infection, scarring, numbness, injury to the SLN/RLN with voice changes/hoarseness, dysphagia, need for tracheostomy, hypocalcemia, hypothyroidism, injury to the trachea or esophagus, hematoma, seroma, abscess, need for further surgery, risks of general anesthesia including (Heart attack, stroke, disability, death). Despite these risks the patient requested to proceed with surgery.    Electronically signed by:  Jenetta Downer, MD  Staff Physician [facility] Otolaryngology - Head and Neck Surgery [facility], [place]  10/10/2022, 7:14 AM

## 2022-10-10 NOTE — Transfer of Care (Signed)
Immediate Anesthesia Transfer of Care Note  [name]  Procedure(s) Performed: RIGHT THYROID LOBECTOMY (Right: Neck) CENTRAL NECK DISSECTION (Right: Neck)  Patient Location: PACU  Anesthesia Type:General  Level of Consciousness: awake, alert , and oriented  Airway & Oxygen Therapy: Patient Spontanous Breathing and Patient connected to nasal cannula oxygen  Post-op Assessment: Report given to RN, Post -op Vital signs reviewed and stable, Patient moving all extremities X 4, and Patient able to stick tongue midline  Post vital signs: Reviewed  Last Vitals:  Vitals Value Taken Time  BP 132/67   Temp 97.5   Pulse 84 10/10/22 1235  Resp 13 10/10/22 1235  SpO2 98 % 10/10/22 1235  Vitals shown include unvalidated device data.  Last Pain:  Vitals:   10/10/22 0644  TempSrc: Oral  PainSc: 6          Complications: No notable events documented.

## 2022-10-10 NOTE — Op Note (Signed)
OPERATIVE NOTE  Renee Maldonado Date/Time of Admission: 10/10/2022  6:16 AM  CSN: [id number]:[id number] Attending Provider: Jenetta Downer, MD Room/Bed: [id number]/3Y86V-78 DOB: 09-01-1946 Age: 76 y.o.   Pre-Op Diagnosis: Papillary thyroid carcinoma  Post-Op Diagnosis: Papillary thyroid carcinoma  Procedure:  Right Thyroid Lobectomy Right central (level VI) neck dissection  Anesthesia: General  Surgeon(s): Pamala Hurry, MD  Staff: Circulator: Margy Clarks, RN Scrub Person: Arnoldo Morale, RN; Zannie Kehr, RN RN First Assistant: Celene Squibb, RN  Implants: * No implants in log *  Specimens: ID Type Source Tests Collected by Time Destination  1 : Right thyroid lobectomy (Long stitch superior, short stitch medial) Tissue PATH ENT excision SURGICAL PATHOLOGY Jenetta Downer, MD 10/10/2022 0932   2 : Central neck fat Tissue PATH Other SURGICAL PATHOLOGY Jenetta Downer, MD 10/10/2022 [id number]   3 : Right thyroid strap tissue, RULE OUT CARCINOMA Tissue PATH Other SURGICAL PATHOLOGY Jenetta Downer, MD 10/10/2022 1018   4 : Right central neck dissection Tissue PATH Other SURGICAL PATHOLOGY Jenetta Downer, MD 29/52/8413 2440     Complications: none  EBL: 30 ML  IVF: Per anesthesia report ML  Condition: Stable  Indications for procedure: Renee Maldonado is a 66 yea rold woman with a history of right lung cancer s/p definitive RT, cT1N0 papillary thyroid carcinoma of the right thyroid lobe with CT imaging concerning for a retrotracheal/paraesophageal nodular extension of thyroid tissue. She presents today for definitive surgical management. Informed consent obtained. R/B/A discussed including risks of pain, bleeding, infection, scarring, numbness, injury to the SLN/RLN with voice changes/hoarseness, dysphagia, need for tracheostomy, hypocalcemia, hypothyroidism, injury to the trachea or esophagus, hematoma, seroma, abscess, need for further surgery, risks of general  anesthesia including (Heart attack, stroke, disability, death). Despite these risks the patient requested to proceed with surgery.  Operative Findings:  Right thyroid lobe with scar tissue adherent to right sternothyroid muscle - taken with cuff of tissue; frozen section negative for carcinoma.  Paraesophageal thyroid nodule almost completely distinct from right thyroid lobe - dissected without adhesion to esophagus, trachea, or prevertebral fascia.  Right RLN identified and preserved throughout the operation with normal stimulation at end of procedure No pathologic appearing level VI lymph nodes  Description of Operation: The patient was identified in the preoperative holding area.  Informed written consent including risks, benefits, alternatives, and possible complications including bleeding and nerve injury were obtained.  Patient was then taken, under the care of anesthesia, to the operating suite.  Patient was placed in the supine position on the operating table and then general anesthesia was administered using a NIMS endotracheal tube per anesthesia's protocol.  Incision site on anterior neck was marked and infiltrated with 3cc Lidocaine 1% with 1:100,000 epinephrine.  A transverse incision, approximately 5cm in length was made within a skin crease with a 15 blade scalpel. The incision was carried down through the platysma. Inferiorly and superiorly based subplatysmal flaps were raised to the level of the clavicle and thyroid cartilage, respectively.  Four 2-0 silk sutures were used to retract the skin flaps at each corner of the incision. The midline raphe of the strap muscles was then identified and separated with the Bovie electrocautery in anatomic midline. The strap muscles were then dissected from the right thyroid capsule. There was dense scar tissue present inferiorly unclear if this was due to extracapsular extension of the thyroid or from prior biopsy. A cuff of sternothyroid musculature  was taken with the specimen. A frozen section ruled out carcinoma.  The strap muscles were retracted laterally. The superior pole vasculature was then identified and surrounding tissue was dissected free using both sharp and blunt instrumentation.  The superior pole vasculature was then ligated with surgical clips.  The inferior pole was then identified and surrounding tissue were dissected free using both sharp and blunt instrumentation.  The inferior pole vessels were then clamped, cut, and ligated with surgical cliops. The thyroid was then retracted medially.  The middle thyroid vein was identified and ligated with 3-0 silk ties.  The recurrent laryngeal nerve was then identified in the tracheoesophageal groove.  The nerve integrity monitoring probe was then used to stimulate the recurrent laryngeal nerve to ensure its identification and integrity.  The nerve was carefully dissected superiorly until it entered the larynx.    Next the deep paraesophgeal nodule was dissected out and freely released off the esophagus and prevertebral facsia. Vacular attachments were diveded with surgical clips. This was delivered from the deep neck in preparation to remove the entire thryoid lobe. There was a pedicle of tissue ~2cm in length connecting it to the main thyroid lobe.  The Right  thyroid was removed from the anterior tracheal wall by releasing Berry's Ligament with bipolar cautery. The thyroid lobe was then passed off the field to be evaluated by pathology and marked with stitches (long marked superior, short marked medial).  A right central neck dissection was performed removing the fibrofatty tissue between the trachea and carotid sheath and inferiorly to the level of the sternal notch. The right RLN was kept in view during the dissection and preserved. At the end of surgery normal nerve stimulation was produced with NIM probe.   The wound bed was copiously irrigated and once again inspected for any bleeding.   There was none.  Surgicel was placed in the wound bed.  A 15 french JP drain was passed through a separate stab incision inferior to the surgical site and sutured in place using 2-0 nylon suture. The strap muscles were then reapproximated using a 3-0 vicryl suture in a buried interrupted fashion. The incision was then closed in a layered fashion. The platysma was closed with interrupted 3-0 Vicryl sutures.  The subcutaneous tissue was then closed with buried interrupted 4-0 vicryl sutures.  The skin was then closed with Dermabond.  A dressing consisting of telfa and tegaderm was placed on the incision. All counts were correct and the procedure was completed.  The patient recovered uneventfully in PACU and will be admitted overnight for drain care.    Pamala Hurry, MD [facility]  10/10/2022

## 2022-10-10 NOTE — Anesthesia Postprocedure Evaluation (Signed)
Anesthesia Post Note  Patient: Shaye Batterman  Procedure(s) Performed: RIGHT THYROID LOBECTOMY (Right: Neck) CENTRAL NECK DISSECTION (Right: Neck)     Patient location during evaluation: PACU Anesthesia Type: General Level of consciousness: awake and alert Pain management: pain level controlled Vital Signs Assessment: post-procedure vital signs reviewed and stable Respiratory status: spontaneous breathing, nonlabored ventilation, respiratory function stable and patient connected to nasal cannula oxygen Cardiovascular status: blood pressure returned to baseline and stable Postop Assessment: no apparent nausea or vomiting Anesthetic complications: no  No notable events documented.  Last Vitals:  Vitals:   10/10/22 0644 10/10/22 1235  BP: (!) 153/76 132/67  Pulse: 83 87  Resp: 20 13  Temp: 37 C (!) 36.4 C  SpO2: 96% 99%    Last Pain:  Vitals:   10/10/22 0644  TempSrc: Oral  PainSc: 6                  Bernard Slayden

## 2022-10-10 NOTE — Progress Notes (Signed)
Pt admitted to 6N28 from PACU s/p R thyroid lobectomy with 1 JP drain intact with bloody drainage. Alert and oriented, no c/o pain.

## 2022-10-11 ENCOUNTER — Encounter (HOSPITAL_COMMUNITY): Payer: Self-pay | Admitting: Otolaryngology

## 2022-10-11 DIAGNOSIS — I10 Essential (primary) hypertension: Secondary | ICD-10-CM | POA: Diagnosis not present

## 2022-10-11 DIAGNOSIS — C73 Malignant neoplasm of thyroid gland: Secondary | ICD-10-CM | POA: Diagnosis not present

## 2022-10-11 DIAGNOSIS — Z96653 Presence of artificial knee joint, bilateral: Secondary | ICD-10-CM | POA: Diagnosis not present

## 2022-10-11 DIAGNOSIS — Z7951 Long term (current) use of inhaled steroids: Secondary | ICD-10-CM | POA: Diagnosis not present

## 2022-10-11 DIAGNOSIS — Z7982 Long term (current) use of aspirin: Secondary | ICD-10-CM | POA: Diagnosis not present

## 2022-10-11 DIAGNOSIS — Z79899 Other long term (current) drug therapy: Secondary | ICD-10-CM | POA: Diagnosis not present

## 2022-10-11 DIAGNOSIS — Z87891 Personal history of nicotine dependence: Secondary | ICD-10-CM | POA: Diagnosis not present

## 2022-10-11 DIAGNOSIS — J449 Chronic obstructive pulmonary disease, unspecified: Secondary | ICD-10-CM | POA: Diagnosis not present

## 2022-10-11 NOTE — Progress Notes (Signed)
Renee Maldonado to be D/C'd  per MD order.  Discussed with the patient and all questions fully answered.  VSS, Skin clean, dry and intact without evidence of skin break down, no evidence of skin tears noted.  IV catheter discontinued intact. Site without signs and symptoms of complications. Dressing and pressure applied.  An After Visit Summary was printed and given to the patient. Patient received prescription.  D/c education completed with patient/family including follow up instructions, medication list, d/c activities limitations if indicated, with other d/c instructions as indicated by MD - patient able to verbalize understanding, all questions fully answered.   Patient instructed to return to ED, call 911, or call MD for any changes in condition.   Patient to be escorted via [place], and D/C home via private auto.

## 2022-10-11 NOTE — Plan of Care (Signed)

## 2022-10-11 NOTE — Plan of Care (Signed)

## 2022-10-11 NOTE — Discharge Summary (Signed)
Physician Discharge Summary  Patient ID: Renee Maldonado MRN: [id number] DOB/AGE: Mar 28, 1946 76 y.o.  Admit date: 10/10/2022 Discharge date: 10/11/2022  Admission Diagnoses: Thyroid mass  Discharge Diagnoses:  Principal Problem:   Papillary thyroid carcinoma ([place])   Discharged Condition: good  Hospital Course: No complications  Consults: none  Significant Diagnostic Studies: none  Treatments: surgery: Thyroid lobectomy, central node dissection  Discharge Exam: Blood pressure (!) 128/56, pulse 76, temperature 98.7 F (37.1 C), temperature source Oral, resp. rate 16, SpO2 95 %. PHYSICAL EXAM: Awake and alert.  Eating breakfast without difficulty.  Breathing and voice are normal.  Incision looks excellent.  Drain removed.  Voice is normal.  Disposition: Discharge disposition: 01-Home or Self Care       Discharge Instructions     Diet - low sodium heart healthy   Complete by: As directed    Discharge wound care:   Complete by: As directed    Keep neck clean and dry.  Keep dressing on drain site until there is no drainage.   Increase activity slowly   Complete by: As directed       Allergies as of 10/11/2022       Reactions   Benadryl [diphenhydramine Hcl] Nausea Only   Dizziness   Penicillins    Had a rash around 20 years ago but unsure if it come from medicine.  Tolerated Cephalosporin Date: 10/09/20.        Medication List     TAKE these medications    acetaminophen 500 MG tablet Commonly known as: TYLENOL Take 1 tablet (500 mg total) by mouth every 6 (six) hours as needed for up to 7 days for mild pain. What changed: reasons to take this   albuterol 108 (90 Base) MCG/ACT inhaler Commonly known as: VENTOLIN HFA 2 puffs in lungs every 6 hours as needed for wheezing/short of breath.   amLODipine 5 MG tablet Commonly known as: NORVASC Take 1 tablet (5 mg total) by mouth daily.   anastrozole 1 MG tablet Commonly known as: ARIMIDEX Take 1 mg by  mouth in the morning.   aspirin 500 MG tablet Take 500 mg by mouth every 6 (six) hours as needed (pain.).   Calcium Carb-Cholecalciferol 600-20 MG-MCG Tabs Take 1 tablet by mouth in the morning.   Chlor-Trimeton 4 MG tablet Generic drug: chlorpheniramine Take 4 mg by mouth See admin instructions. Take 1 tablet (4 mg) by mouth scheduled every night & may take an additional dose in the morning if needed for allergies.   cholecalciferol 25 MCG (1000 UNIT) tablet Commonly known as: VITAMIN D3 Take 1,000 Units by mouth in the morning.   cyanocobalamin 1000 MCG/ML injection Commonly known as: VITAMIN B12 Inject 1 mL (1,000 mcg total) into the skin every 30 (thirty) days.   famotidine 40 MG tablet Commonly known as: PEPCID Take 1 tablet (40 mg total) by mouth daily.   fluticasone furoate-vilanterol 200-25 MCG/ACT Aepb Commonly known as: Breo Ellipta Inhale 1 puff into the lungs daily. What changed: when to take this   hydrochlorothiazide 25 MG tablet Commonly known as: HYDRODIURIL Take 1 tablet (25 mg total) by mouth daily.   hydrocortisone cream 1 % Apply 1 Application topically daily as needed (dry/itchy skin on back).   hydrOXYzine 25 MG tablet Commonly known as: ATARAX Take 25 mg by mouth every 4 (four) hours as needed for itching.   ibuprofen 200 MG tablet Commonly known as: Advil Take 2 tablets (400 mg total) by mouth every 6 (  six) hours as needed for up to 7 days for moderate pain.   L-Lysine 500 MG Tabs Take 500 mg by mouth every evening.   Lubricant Eye Drops 0.4-0.3 % Soln Generic drug: Polyethyl Glycol-Propyl Glycol Place 1-2 drops into both eyes 3 (three) times daily as needed (dry/irritated eyes.).   Melatonin 10 MG Caps Take 10 mg by mouth at bedtime.   oxyCODONE 5 MG immediate release tablet Commonly known as: Roxicodone Take 1 tablet (5 mg total) by mouth every 6 (six) hours as needed for up to 5 days for breakthrough pain.   raloxifene 60 MG  tablet Commonly known as: EVISTA Take 1 tablet (60 mg total) by mouth daily.   Restasis 0.05 % ophthalmic emulsion Generic drug: cycloSPORINE Place 1 drop into both eyes 2 (two) times daily as needed (dry eyes).   rosuvastatin 20 MG tablet Commonly known as: CRESTOR TAKE 1 TABLET DAILY What changed: when to take this   sodium chloride 0.65 % Soln nasal spray Commonly known as: OCEAN Place 1 spray into both nostrils as needed for congestion.   traZODone 100 MG tablet Commonly known as: DESYREL TAKE 1 TABLET AT BEDTIME   valACYclovir 500 MG tablet Commonly known as: VALTREX TAKE (1) TABLET DAILY AS NEEDED. What changed:  how much to take how to take this when to take this reasons to take this additional instructions               Discharge Care Instructions  (From admission, onward)           Start     Ordered   10/11/22 0000  Discharge wound care:       Comments: Keep neck clean and dry.  Keep dressing on drain site until there is no drainage.   10/11/22 0759             Signed: Izora Gala 10/11/2022, 8:00 AM

## 2022-10-14 LAB — SURGICAL PATHOLOGY

## 2022-10-15 ENCOUNTER — Ambulatory Visit (INDEPENDENT_AMBULATORY_CARE_PROVIDER_SITE_OTHER): Payer: Medicare HMO | Admitting: *Deleted

## 2022-10-15 DIAGNOSIS — E538 Deficiency of other specified B group vitamins: Secondary | ICD-10-CM

## 2022-10-15 MED ORDER — CYANOCOBALAMIN 1000 MCG/ML IJ SOLN
1000.0000 ug | Freq: Once | INTRAMUSCULAR | Status: AC
  Administered 2022-10-15: 1000 ug via INTRAMUSCULAR

## 2022-10-28 ENCOUNTER — Other Ambulatory Visit: Payer: Self-pay | Admitting: Family Medicine

## 2022-10-28 DIAGNOSIS — E785 Hyperlipidemia, unspecified: Secondary | ICD-10-CM

## 2022-10-31 ENCOUNTER — Ambulatory Visit (INDEPENDENT_AMBULATORY_CARE_PROVIDER_SITE_OTHER): Payer: Medicare HMO | Admitting: Nurse Practitioner

## 2022-10-31 ENCOUNTER — Encounter: Payer: Self-pay | Admitting: Nurse Practitioner

## 2022-10-31 DIAGNOSIS — K591 Functional diarrhea: Secondary | ICD-10-CM

## 2022-10-31 DIAGNOSIS — R112 Nausea with vomiting, unspecified: Secondary | ICD-10-CM

## 2022-10-31 MED ORDER — ONDANSETRON HCL 4 MG PO TABS: 4.0000 mg | ORAL_TABLET | Freq: Three times a day (TID) | ORAL | 0 refills | Status: DC | PRN

## 2022-10-31 NOTE — Progress Notes (Signed)
Virtual Visit  Note Due to COVID-19 pandemic this visit was conducted virtually. This visit type was conducted due to national recommendations for restrictions regarding the COVID-19 Pandemic (e.g. social distancing, sheltering in place) in an effort to limit this patient's exposure and mitigate transmission in our community. All issues noted in this document were discussed and addressed.  A physical exam was not performed with this format.  I connected with Renee Maldonado on 10/31/22 at 8:10 by telephone and verified that I am speaking with the correct person using two identifiers. Renee Maldonado is currently located at home and no one is currently with her during visit. The provider, Mary-Margaret Hassell Done, FNP is located in their office at time of visit.  I discussed the limitations, risks, security and privacy concerns of performing an evaluation and management service by telephone and the availability of in person appointments. I also discussed with the patient that there may be a patient responsible charge related to this service. The patient expressed understanding and agreed to proceed.   History and Present Illness:  Emesis  This is a new problem. The current episode started in the past 7 days. The problem occurs 2 to 4 times per day. The problem has been waxing and waning. There has been no fever. Associated symptoms include diarrhea. Treatments tried: zofran last night.  Diarrhea  This is a new problem. The current episode started in the past 7 days. The problem has been waxing and waning. Associated symptoms include vomiting. Nothing aggravates the symptoms. She has tried nothing (imodium and pepto bismol) for the symptoms. The treatment provided mild relief.      Review of Systems  Gastrointestinal:  Positive for diarrhea and vomiting.     Observations/Objective: Alert and oriented- answers all questions appropriately No distress   Assessment and Plan: [name] in today  with chief complaint of Emesis and Diarrhea   1. Nausea and vomiting, unspecified vomiting type First 24 Hours-Clear liquids  popsicles  Jello  gatorade  Sprite Second 24 hours-Add Full liquids ( Liquids you cant see through) Third 24 hours- Bland diet ( foods that are baked or broiled)  *avoiding fried foods and highly spiced foods* During these 3 days  Avoid milk, cheese, ice cream or any other dairy products  Avoid caffeine- REMEMBER Mt. Dew and Mello Yellow contain lots of caffeine You should eat and drink in  Frequent small volumes If no improvement in symptoms or worsen in 2-3 days should RETRUN TO OFFICE or go to ER!    - ondansetron (ZOFRAN) 4 MG tablet; Take 1 tablet (4 mg total) by mouth every 8 (eight) hours as needed for nausea or vomiting.  Dispense: 20 tablet; Refill: 0  2. Functional diarrhea Imodium OTC Force fluids    Follow Up Instructions: prn    I discussed the assessment and treatment plan with the patient. The patient was provided an opportunity to ask questions and all were answered. The patient agreed with the plan and demonstrated an understanding of the instructions.   The patient was advised to call back or seek an in-person evaluation if the symptoms worsen or if the condition fails to improve as anticipated.  The above assessment and management plan was discussed with the patient. The patient verbalized understanding of and has agreed to the management plan. Patient is aware to call the clinic if symptoms persist or worsen. Patient is aware when to return to the clinic for a follow-up visit. Patient educated on when it  is appropriate to go to the emergency department.   Time call ended:  8:22  I provided 12 minutes of  non face-to-face time during this encounter.    Mary-Margaret Hassell Done, FNP

## 2022-10-31 NOTE — Patient Instructions (Signed)
Diarrhea, Adult Diarrhea is when you pass loose and sometimes watery poop (stool) often. Diarrhea can make you feel weak and cause you to lose water in your body (get dehydrated). Losing water in your body can cause you to: Feel tired and thirsty. Have a dry mouth. Go pee (urinate) less often. Diarrhea often lasts 2-3 days. It can last longer if it is a sign of something more serious. Be sure to treat your diarrhea as told by your doctor. Follow these instructions at home: Eating and drinking     Follow these instructions as told by your doctor: Take an ORS (oral rehydration solution). This is a drink that helps you replace fluids and minerals your body lost. It is sold at pharmacies and stores. Drink enough fluid to keep your pee (urine) pale yellow. Drink fluids such as: Water. You can also get fluids by sucking on ice chips. Diluted fruit juice. Low-calorie sports drinks. Milk. Avoid drinking fluids that have a lot of sugar or caffeine in them. These include soda, energy drinks, and regular sports drinks. Avoid alcohol. Eat bland, easy-to-digest foods in small amounts as you are able. These foods include: Bananas. Applesauce. Rice. Low-fat (lean) meats. Toast. Crackers. Avoid spicy or fatty foods.  Medicines Take over-the-counter and prescription medicines only as told by your doctor. If you were prescribed antibiotics, take them as told by your doctor. Do not stop taking them even if you start to feel better. General instructions  Wash your hands often using soap and water for 20 seconds. If soap and water are not available, use hand sanitizer. Others in your home should wash their hands as well. Wash your hands: After using the toilet or changing a diaper. Before preparing, cooking, or serving food. While caring for a sick person. While visiting someone in a hospital. Rest at home while you get better. Take a warm bath to help with any burning or pain from having  diarrhea. Watch your condition for any changes. Contact a doctor if: You have a fever. Your diarrhea gets worse. You have new symptoms. You vomit every time you eat or drink. You feel light-headed, dizzy, or you have a headache. You have muscle cramps. You have signs of losing too much water in your body, such as: Dark pee, very little pee, or no pee. Cracked lips. Dry mouth. Sunken eyes. Sleepiness. Weakness. You have bloody or black poop or poop that looks like tar. You have very bad pain, cramping, or bloating in your belly (abdomen). Your skin feels cold and clammy. You feel confused. Get help right away if: You have chest pain. Your heart is beating very quickly. You have trouble breathing or you are breathing very quickly. You feel very weak or you faint. These symptoms may be an emergency. Get help right away. Call 911. Do not wait to see if the symptoms will go away. Do not drive yourself to the hospital. This information is not intended to replace advice given to you by your health care provider. Make sure you discuss any questions you have with your health care provider. Document Revised: 04/22/2022 Document Reviewed: 04/22/2022 [employer] Patient Education  [place].

## 2022-11-01 ENCOUNTER — Emergency Department (HOSPITAL_COMMUNITY): Payer: Medicare HMO

## 2022-11-01 ENCOUNTER — Other Ambulatory Visit: Payer: Self-pay

## 2022-11-01 ENCOUNTER — Encounter (HOSPITAL_COMMUNITY): Payer: Self-pay | Admitting: Emergency Medicine

## 2022-11-01 ENCOUNTER — Inpatient Hospital Stay (HOSPITAL_COMMUNITY)
Admission: EM | Admit: 2022-11-01 | Discharge: 2022-11-15 | DRG: 640 | Disposition: A | Discharge status: Home Health | Payer: Medicare HMO | Attending: Internal Medicine | Admitting: Internal Medicine

## 2022-11-01 DIAGNOSIS — K7460 Unspecified cirrhosis of liver: Secondary | ICD-10-CM | POA: Diagnosis not present

## 2022-11-01 DIAGNOSIS — Z87891 Personal history of nicotine dependence: Secondary | ICD-10-CM

## 2022-11-01 DIAGNOSIS — I10 Essential (primary) hypertension: Secondary | ICD-10-CM | POA: Diagnosis not present

## 2022-11-01 DIAGNOSIS — I959 Hypotension, unspecified: Secondary | ICD-10-CM | POA: Diagnosis not present

## 2022-11-01 DIAGNOSIS — E876 Hypokalemia: Principal | ICD-10-CM | POA: Diagnosis present

## 2022-11-01 DIAGNOSIS — I129 Hypertensive chronic kidney disease with stage 1 through stage 4 chronic kidney disease, or unspecified chronic kidney disease: Secondary | ICD-10-CM | POA: Diagnosis present

## 2022-11-01 DIAGNOSIS — R197 Diarrhea, unspecified: Principal | ICD-10-CM

## 2022-11-01 DIAGNOSIS — N170 Acute kidney failure with tubular necrosis: Secondary | ICD-10-CM | POA: Diagnosis not present

## 2022-11-01 DIAGNOSIS — K219 Gastro-esophageal reflux disease without esophagitis: Secondary | ICD-10-CM | POA: Diagnosis not present

## 2022-11-01 DIAGNOSIS — K610 Anal abscess: Secondary | ICD-10-CM

## 2022-11-01 DIAGNOSIS — Z7951 Long term (current) use of inhaled steroids: Secondary | ICD-10-CM

## 2022-11-01 DIAGNOSIS — E669 Obesity, unspecified: Secondary | ICD-10-CM | POA: Diagnosis not present

## 2022-11-01 DIAGNOSIS — E871 Hypo-osmolality and hyponatremia: Secondary | ICD-10-CM | POA: Diagnosis not present

## 2022-11-01 DIAGNOSIS — K8689 Other specified diseases of pancreas: Secondary | ICD-10-CM | POA: Diagnosis not present

## 2022-11-01 DIAGNOSIS — Z79811 Long term (current) use of aromatase inhibitors: Secondary | ICD-10-CM

## 2022-11-01 DIAGNOSIS — K626 Ulcer of anus and rectum: Secondary | ICD-10-CM | POA: Diagnosis not present

## 2022-11-01 DIAGNOSIS — E860 Dehydration: Secondary | ICD-10-CM | POA: Diagnosis not present

## 2022-11-01 DIAGNOSIS — E785 Hyperlipidemia, unspecified: Secondary | ICD-10-CM | POA: Diagnosis present

## 2022-11-01 DIAGNOSIS — Z820 Family history of epilepsy and other diseases of the nervous system: Secondary | ICD-10-CM

## 2022-11-01 DIAGNOSIS — N1831 Chronic kidney disease, stage 3a: Secondary | ICD-10-CM | POA: Diagnosis present

## 2022-11-01 DIAGNOSIS — Z809 Family history of malignant neoplasm, unspecified: Secondary | ICD-10-CM

## 2022-11-01 DIAGNOSIS — N179 Acute kidney failure, unspecified: Secondary | ICD-10-CM

## 2022-11-01 DIAGNOSIS — J449 Chronic obstructive pulmonary disease, unspecified: Secondary | ICD-10-CM | POA: Diagnosis not present

## 2022-11-01 DIAGNOSIS — E890 Postprocedural hypothyroidism: Secondary | ICD-10-CM | POA: Diagnosis present

## 2022-11-01 DIAGNOSIS — K529 Noninfective gastroenteritis and colitis, unspecified: Secondary | ICD-10-CM

## 2022-11-01 DIAGNOSIS — Z79899 Other long term (current) drug therapy: Secondary | ICD-10-CM

## 2022-11-01 DIAGNOSIS — Z853 Personal history of malignant neoplasm of breast: Secondary | ICD-10-CM

## 2022-11-01 DIAGNOSIS — C73 Malignant neoplasm of thyroid gland: Secondary | ICD-10-CM | POA: Diagnosis present

## 2022-11-01 DIAGNOSIS — Z96651 Presence of right artificial knee joint: Secondary | ICD-10-CM | POA: Diagnosis present

## 2022-11-01 DIAGNOSIS — Z823 Family history of stroke: Secondary | ICD-10-CM

## 2022-11-01 DIAGNOSIS — F411 Generalized anxiety disorder: Secondary | ICD-10-CM | POA: Diagnosis present

## 2022-11-01 DIAGNOSIS — R109 Unspecified abdominal pain: Secondary | ICD-10-CM | POA: Diagnosis not present

## 2022-11-01 DIAGNOSIS — Z1152 Encounter for screening for COVID-19: Secondary | ICD-10-CM | POA: Diagnosis not present

## 2022-11-01 DIAGNOSIS — Z833 Family history of diabetes mellitus: Secondary | ICD-10-CM

## 2022-11-01 DIAGNOSIS — A082 Adenoviral enteritis: Secondary | ICD-10-CM | POA: Diagnosis not present

## 2022-11-01 DIAGNOSIS — C3491 Malignant neoplasm of unspecified part of right bronchus or lung: Secondary | ICD-10-CM | POA: Diagnosis not present

## 2022-11-01 DIAGNOSIS — R69 Illness, unspecified: Secondary | ICD-10-CM | POA: Diagnosis not present

## 2022-11-01 DIAGNOSIS — K5790 Diverticulosis of intestine, part unspecified, without perforation or abscess without bleeding: Secondary | ICD-10-CM | POA: Diagnosis not present

## 2022-11-01 DIAGNOSIS — Z880 Allergy status to penicillin: Secondary | ICD-10-CM

## 2022-11-01 DIAGNOSIS — K602 Anal fissure, unspecified: Secondary | ICD-10-CM | POA: Diagnosis not present

## 2022-11-01 DIAGNOSIS — E872 Acidosis, unspecified: Secondary | ICD-10-CM | POA: Diagnosis not present

## 2022-11-01 DIAGNOSIS — M1711 Unilateral primary osteoarthritis, right knee: Secondary | ICD-10-CM | POA: Diagnosis present

## 2022-11-01 DIAGNOSIS — Z85850 Personal history of malignant neoplasm of thyroid: Secondary | ICD-10-CM

## 2022-11-01 DIAGNOSIS — A09 Infectious gastroenteritis and colitis, unspecified: Secondary | ICD-10-CM | POA: Diagnosis not present

## 2022-11-01 DIAGNOSIS — Z8249 Family history of ischemic heart disease and other diseases of the circulatory system: Secondary | ICD-10-CM

## 2022-11-01 DIAGNOSIS — Z6835 Body mass index (BMI) 35.0-35.9, adult: Secondary | ICD-10-CM

## 2022-11-01 DIAGNOSIS — J439 Emphysema, unspecified: Secondary | ICD-10-CM | POA: Diagnosis not present

## 2022-11-01 LAB — URINALYSIS, ROUTINE W REFLEX MICROSCOPIC
Bilirubin Urine: NEGATIVE
Cellular Cast, UA: 9
Glucose, UA: NEGATIVE mg/dL
Ketones, ur: 5 mg/dL — AB
Leukocytes,Ua: NEGATIVE
Nitrite: NEGATIVE
Protein, ur: 100 mg/dL — AB
Specific Gravity, Urine: 1.015 (ref 1.005–1.030)
pH: 5 (ref 5.0–8.0)

## 2022-11-01 LAB — COMPREHENSIVE METABOLIC PANEL
ALT: 21 U/L (ref 0–44)
AST: 27 U/L (ref 15–41)
Albumin: 4.5 g/dL (ref 3.5–5.0)
Alkaline Phosphatase: 69 U/L (ref 38–126)
Anion gap: 11 (ref 5–15)
BUN: 32 mg/dL — ABNORMAL HIGH (ref 8–23)
CO2: 25 mmol/L (ref 22–32)
Calcium: 8.8 mg/dL — ABNORMAL LOW (ref 8.9–10.3)
Chloride: 96 mmol/L — ABNORMAL LOW (ref 98–111)
Creatinine, Ser: 2.14 mg/dL — ABNORMAL HIGH (ref 0.44–1.00)
GFR, Estimated: 23 mL/min — ABNORMAL LOW (ref >60)
Glucose, Bld: 139 mg/dL — ABNORMAL HIGH (ref 70–99)
Potassium: 2.1 mmol/L — CL (ref 3.5–5.1)
Sodium: 132 mmol/L — ABNORMAL LOW (ref 135–145)
Total Bilirubin: 1.1 mg/dL (ref 0.3–1.2)
Total Protein: 7.8 g/dL (ref 6.5–8.1)

## 2022-11-01 LAB — CBC WITH DIFFERENTIAL/PLATELET
Abs Immature Granulocytes: 0.01 10*3/uL (ref 0.00–0.07)
Basophils Absolute: 0 10*3/uL (ref 0.0–0.1)
Basophils Relative: 0 %
Eosinophils Absolute: 0.1 10*3/uL (ref 0.0–0.5)
Eosinophils Relative: 2 %
HCT: 40.9 % (ref 36.0–46.0)
Hemoglobin: 13.8 g/dL (ref 12.0–15.0)
Immature Granulocytes: 0 %
Lymphocytes Relative: 10 %
Lymphs Abs: 0.5 10*3/uL — ABNORMAL LOW (ref 0.7–4.0)
MCH: 26.8 pg (ref 26.0–34.0)
MCHC: 33.7 g/dL (ref 30.0–36.0)
MCV: 79.6 fL — ABNORMAL LOW (ref 80.0–100.0)
Monocytes Absolute: 0.6 10*3/uL (ref 0.1–1.0)
Monocytes Relative: 12 %
Neutro Abs: 3.3 10*3/uL (ref 1.7–7.7)
Neutrophils Relative %: 76 %
Platelets: 289 10*3/uL (ref 150–400)
RBC: 5.14 MIL/uL — ABNORMAL HIGH (ref 3.87–5.11)
RDW: 13.2 % (ref 11.5–15.5)
WBC: 4.4 10*3/uL (ref 4.0–10.5)
nRBC: 0 % (ref 0.0–0.2)

## 2022-11-01 LAB — CBG MONITORING, ED: Glucose-Capillary: 160 mg/dL — ABNORMAL HIGH (ref 70–99)

## 2022-11-01 LAB — RESP PANEL BY RT-PCR (RSV, FLU A&B, COVID)  RVPGX2
Influenza A by PCR: NEGATIVE
Influenza B by PCR: NEGATIVE
Resp Syncytial Virus by PCR: NEGATIVE
SARS Coronavirus 2 by RT PCR: NEGATIVE

## 2022-11-01 LAB — C DIFFICILE QUICK SCREEN W PCR REFLEX
C Diff antigen: NEGATIVE
C Diff interpretation: NOT DETECTED
C Diff toxin: NEGATIVE

## 2022-11-01 LAB — POTASSIUM: Potassium: 3.4 mmol/L — ABNORMAL LOW (ref 3.5–5.1)

## 2022-11-01 LAB — LIPASE, BLOOD: Lipase: 26 U/L (ref 11–51)

## 2022-11-01 MED ORDER — ANASTROZOLE 1 MG PO TABS
1.0000 mg | ORAL_TABLET | Freq: Every morning | ORAL | Status: DC
  Administered 2022-11-02 – 2022-11-15 (×13): 1 mg via ORAL
  Filled 2022-11-01 (×18): qty 1

## 2022-11-01 MED ORDER — ONDANSETRON HCL 4 MG PO TABS: 4.0000 mg | ORAL_TABLET | Freq: Four times a day (QID) | ORAL | Status: DC | PRN

## 2022-11-01 MED ORDER — POTASSIUM CHLORIDE 10 MEQ/100ML IV SOLN
INTRAVENOUS | Status: AC
  Administered 2022-11-01: 10 meq via INTRAVENOUS
  Filled 2022-11-01: qty 100

## 2022-11-01 MED ORDER — POLYVINYL ALCOHOL 1.4 % OP SOLN: 1.0000 [drp] | Freq: Three times a day (TID) | OPHTHALMIC | Status: DC | PRN

## 2022-11-01 MED ORDER — ONDANSETRON HCL 4 MG/2ML IJ SOLN
4.0000 mg | Freq: Four times a day (QID) | INTRAMUSCULAR | Status: DC | PRN
  Administered 2022-11-01 – 2022-11-02 (×3): 4 mg via INTRAVENOUS
  Filled 2022-11-01 (×4): qty 2

## 2022-11-01 MED ORDER — POTASSIUM CHLORIDE 10 MEQ/100ML IV SOLN
10.0000 meq | INTRAVENOUS | Status: AC
  Administered 2022-11-01 (×2): 10 meq via INTRAVENOUS
  Filled 2022-11-01 (×2): qty 100

## 2022-11-01 MED ORDER — MELATONIN 3 MG PO TABS: 10.0000 mg | ORAL_TABLET | Freq: Every day | ORAL | Status: DC

## 2022-11-01 MED ORDER — FLUTICASONE FUROATE-VILANTEROL 200-25 MCG/ACT IN AEPB
1.0000 | INHALATION_SPRAY | RESPIRATORY_TRACT | Status: DC
  Administered 2022-11-01 – 2022-11-13 (×8): 1 via RESPIRATORY_TRACT
  Filled 2022-11-01 (×2): qty 28

## 2022-11-01 MED ORDER — FAMOTIDINE 20 MG PO TABS: 20.0000 mg | ORAL_TABLET | Freq: Every day | ORAL | Status: DC

## 2022-11-01 MED ORDER — HEPARIN SODIUM (PORCINE) 5000 UNIT/ML IJ SOLN
5000.0000 [IU] | Freq: Three times a day (TID) | INTRAMUSCULAR | Status: DC
  Administered 2022-11-01 – 2022-11-15 (×41): 5000 [IU] via SUBCUTANEOUS
  Filled 2022-11-01 (×41): qty 1

## 2022-11-01 MED ORDER — LOPERAMIDE HCL 2 MG PO CAPS
2.0000 mg | ORAL_CAPSULE | ORAL | Status: DC | PRN
  Administered 2022-11-02 – 2022-11-05 (×2): 2 mg via ORAL
  Filled 2022-11-01 (×3): qty 1

## 2022-11-01 MED ORDER — ROSUVASTATIN CALCIUM 20 MG PO TABS
20.0000 mg | ORAL_TABLET | Freq: Every day | ORAL | Status: DC
  Administered 2022-11-01 – 2022-11-15 (×15): 20 mg via ORAL
  Filled 2022-11-01 (×15): qty 1

## 2022-11-01 MED ORDER — FAMOTIDINE 20 MG PO TABS
10.0000 mg | ORAL_TABLET | Freq: Every day | ORAL | Status: DC
  Administered 2022-11-01 – 2022-11-15 (×15): 10 mg via ORAL
  Filled 2022-11-01 (×15): qty 1

## 2022-11-01 MED ORDER — ZINC OXIDE 40 % EX OINT
TOPICAL_OINTMENT | Freq: Once | CUTANEOUS | Status: AC
  Filled 2022-11-01: qty 57

## 2022-11-01 MED ORDER — ALBUTEROL SULFATE (2.5 MG/3ML) 0.083% IN NEBU
2.5000 mg | INHALATION_SOLUTION | RESPIRATORY_TRACT | Status: DC | PRN
  Administered 2022-11-10: 2.5 mg via RESPIRATORY_TRACT
  Filled 2022-11-01: qty 3

## 2022-11-01 MED ORDER — FAMOTIDINE 20 MG PO TABS: 40.0000 mg | ORAL_TABLET | Freq: Every day | ORAL | Status: DC

## 2022-11-01 MED ORDER — SODIUM CHLORIDE 0.9 % IV BOLUS
500.0000 mL | Freq: Once | INTRAVENOUS | Status: AC
  Administered 2022-11-01: 500 mL via INTRAVENOUS

## 2022-11-01 MED ORDER — POTASSIUM CHLORIDE 10 MEQ/100ML IV SOLN
INTRAVENOUS | Status: AC
  Filled 2022-11-01: qty 100

## 2022-11-01 MED ORDER — ALBUTEROL SULFATE HFA 108 (90 BASE) MCG/ACT IN AERS: 1.0000 | INHALATION_SPRAY | RESPIRATORY_TRACT | Status: DC | PRN

## 2022-11-01 MED ORDER — ONDANSETRON HCL 4 MG/2ML IJ SOLN
4.0000 mg | Freq: Once | INTRAMUSCULAR | Status: AC
  Administered 2022-11-01: 4 mg via INTRAVENOUS
  Filled 2022-11-01: qty 2

## 2022-11-01 MED ORDER — CHLORHEXIDINE GLUCONATE CLOTH 2 % EX PADS
6.0000 | MEDICATED_PAD | Freq: Every day | CUTANEOUS | Status: DC
  Administered 2022-11-01 – 2022-11-07 (×6): 6 via TOPICAL

## 2022-11-01 MED ORDER — MELATONIN 3 MG PO TABS
9.0000 mg | ORAL_TABLET | Freq: Every day | ORAL | Status: DC
  Administered 2022-11-01 – 2022-11-14 (×13): 9 mg via ORAL
  Filled 2022-11-01 (×14): qty 3

## 2022-11-01 MED ORDER — SALINE SPRAY 0.65 % NA SOLN: 1.0000 | NASAL | Status: DC | PRN

## 2022-11-01 MED ORDER — ACETAMINOPHEN 325 MG PO TABS
650.0000 mg | ORAL_TABLET | Freq: Four times a day (QID) | ORAL | Status: DC | PRN
  Administered 2022-11-13 – 2022-11-14 (×3): 650 mg via ORAL
  Filled 2022-11-01 (×3): qty 2

## 2022-11-01 MED ORDER — POTASSIUM CHLORIDE IN NACL 40-0.9 MEQ/L-% IV SOLN
INTRAVENOUS | Status: DC
  Filled 2022-11-01 (×4): qty 1000

## 2022-11-01 MED ORDER — ONDANSETRON HCL 4 MG PO TABS: 4.0000 mg | ORAL_TABLET | Freq: Three times a day (TID) | ORAL | Status: DC | PRN

## 2022-11-01 MED ORDER — TRAZODONE HCL 50 MG PO TABS
100.0000 mg | ORAL_TABLET | Freq: Every day | ORAL | Status: DC
  Administered 2022-11-01 – 2022-11-14 (×14): 100 mg via ORAL
  Filled 2022-11-01 (×14): qty 2

## 2022-11-01 MED ORDER — AMLODIPINE BESYLATE 5 MG PO TABS
5.0000 mg | ORAL_TABLET | Freq: Every day | ORAL | Status: DC
  Administered 2022-11-01 – 2022-11-06 (×6): 5 mg via ORAL
  Filled 2022-11-01 (×6): qty 1

## 2022-11-01 MED ORDER — POTASSIUM CHLORIDE IN NACL 40-0.9 MEQ/L-% IV SOLN
INTRAVENOUS | Status: DC
  Filled 2022-11-01: qty 1000

## 2022-11-01 MED ORDER — CYCLOSPORINE 0.05 % OP EMUL: 1.0000 [drp] | Freq: Two times a day (BID) | OPHTHALMIC | Status: DC | PRN

## 2022-11-01 MED ORDER — HYDROXYZINE HCL 25 MG PO TABS: 25.0000 mg | ORAL_TABLET | ORAL | Status: DC | PRN

## 2022-11-01 MED ORDER — ACETAMINOPHEN 650 MG RE SUPP: 650.0000 mg | Freq: Four times a day (QID) | RECTAL | Status: DC | PRN

## 2022-11-01 MED ORDER — RALOXIFENE HCL 60 MG PO TABS
60.0000 mg | ORAL_TABLET | Freq: Every day | ORAL | Status: DC
  Administered 2022-11-01 – 2022-11-15 (×15): 60 mg via ORAL
  Filled 2022-11-01 (×17): qty 1

## 2022-11-01 NOTE — ED Provider Notes (Signed)
[facility] EMERGENCY DEPARTMENT Provider Note   CSN: [id number] Arrival date & time: 11/01/22  0210     History  Chief Complaint  Patient presents with   Diarrhea and Vomiting     Renee Maldonado is a 76 y.o. female.  Presents to the emergency department for evaluation of profuse watery diarrhea.  Symptoms ongoing for approximately a week.  She is not experiencing any specific abdominal pain.  She has not had associated vomiting.  Patient reports that she is having diarrhea "every 5 minutes".       Home Medications Prior to Admission medications   Medication Sig Start Date End Date Taking? Authorizing Provider  albuterol (VENTOLIN HFA) 108 (90 Base) MCG/ACT inhaler 2 puffs in lungs every 6 hours as needed for wheezing/short of breath. 12/13/20   Claretta Fraise, MD  amLODipine (NORVASC) 5 MG tablet Take 1 tablet (5 mg total) by mouth daily. 07/14/22   Claretta Fraise, MD  anastrozole (ARIMIDEX) 1 MG tablet Take 1 mg by mouth in the morning. 07/22/19   [provider]  aspirin 500 MG tablet Take 500 mg by mouth every 6 (six) hours as needed (pain.).    [provider]  Calcium Carb-Cholecalciferol 600-20 MG-MCG TABS Take 1 tablet by mouth in the morning.    [provider]  chlorpheniramine (CHLOR-TRIMETON) 4 MG tablet Take 4 mg by mouth See admin instructions. Take 1 tablet (4 mg) by mouth scheduled every night & may take an additional dose in the morning if needed for allergies.    [provider]  cholecalciferol (VITAMIN D3) 25 MCG (1000 UNIT) tablet Take 1,000 Units by mouth in the morning.    [provider]  cyanocobalamin (VITAMIN B12) 1000 MCG/ML injection Inject 1 mL (1,000 mcg total) into the skin every 30 (thirty) days. 07/14/22   Claretta Fraise, MD  famotidine (PEPCID) 40 MG tablet Take 1 tablet (40 mg total) by mouth daily. 07/14/22   Claretta Fraise, MD  fluticasone furoate-vilanterol (BREO ELLIPTA) 200-25 MCG/ACT AEPB Inhale 1 puff  into the lungs daily. Patient taking differently: Inhale 1 puff into the lungs 4 (four) times a week. 07/14/22   Claretta Fraise, MD  hydrochlorothiazide (HYDRODIURIL) 25 MG tablet Take 1 tablet (25 mg total) by mouth daily. 07/14/22   Claretta Fraise, MD  hydrocortisone cream 1 % Apply 1 Application topically daily as needed (dry/itchy skin on back).    [provider]  hydrOXYzine (ATARAX/VISTARIL) 25 MG tablet Take 25 mg by mouth every 4 (four) hours as needed for itching.    [provider]  L-Lysine 500 MG TABS Take 500 mg by mouth every evening.    [provider]  Melatonin 10 MG CAPS Take 10 mg by mouth at bedtime.    [provider]  ondansetron (ZOFRAN) 4 MG tablet Take 1 tablet (4 mg total) by mouth every 8 (eight) hours as needed for nausea or vomiting. 10/31/22   Hassell Done, Mary-Margaret, FNP  Polyethyl Glycol-Propyl Glycol (LUBRICANT EYE DROPS) 0.4-0.3 % SOLN Place 1-2 drops into both eyes 3 (three) times daily as needed (dry/irritated eyes.).    [provider]  raloxifene (EVISTA) 60 MG tablet Take 1 tablet (60 mg total) by mouth daily. 07/14/22   Claretta Fraise, MD  RESTASIS 0.05 % ophthalmic emulsion Place 1 drop into both eyes 2 (two) times daily as needed (dry eyes).  01/18/14   [provider]  rosuvastatin (CRESTOR) 20 MG tablet TAKE 1 TABLET DAILY 10/28/22  Claretta Fraise, MD  sodium chloride (OCEAN) 0.65 % SOLN nasal spray Place 1 spray into both nostrils as needed for congestion.    [provider]  traZODone (DESYREL) 100 MG tablet TAKE 1 TABLET AT BEDTIME 05/12/22   Claretta Fraise, MD  valACYclovir (VALTREX) 500 MG tablet TAKE (1) TABLET DAILY AS NEEDED. Patient taking differently: Take 500 mg by mouth daily as needed (fever blisters.). 12/13/20   Claretta Fraise, MD      Allergies    Benadryl [diphenhydramine hcl] and Penicillins    Review of Systems   Review of Systems  Physical Exam Updated Vital Signs BP  114/60   Pulse 87   Temp 97.8 F (36.6 C) (Oral)   Resp 13   Ht 5\' 1"  (1.549 m)   Wt 85.1 kg   SpO2 98%   BMI 35.45 kg/m  Physical Exam Vitals and nursing note reviewed.  Constitutional:      General: She is not in acute distress.    Appearance: She is well-developed.  HENT:     Head: Normocephalic and atraumatic.     Mouth/Throat:     Mouth: Mucous membranes are moist.  Eyes:     General: Vision grossly intact. Gaze aligned appropriately.     Extraocular Movements: Extraocular movements intact.     Conjunctiva/sclera: Conjunctivae normal.  Cardiovascular:     Rate and Rhythm: Normal rate and regular rhythm.     Pulses: Normal pulses.     Heart sounds: Normal heart sounds, S1 normal and S2 normal. No murmur heard.    No friction rub. No gallop.  Pulmonary:     Effort: Pulmonary effort is normal. No respiratory distress.     Breath sounds: Normal breath sounds.  Abdominal:     General: Bowel sounds are normal.     Palpations: Abdomen is soft.     Tenderness: There is no abdominal tenderness. There is no guarding or rebound.     Hernia: No hernia is present.  Musculoskeletal:        General: No swelling.     Cervical back: Full passive range of motion without pain, normal range of motion and neck supple. No spinous process tenderness or muscular tenderness. Normal range of motion.     Right lower leg: No edema.     Left lower leg: No edema.  Skin:    General: Skin is warm and dry.     Capillary Refill: Capillary refill takes less than 2 seconds.     Findings: No ecchymosis, erythema, rash or wound.  Neurological:     General: No focal deficit present.     Mental Status: She is alert and oriented to person, place, and time.     GCS: GCS eye subscore is 4. GCS verbal subscore is 5. GCS motor subscore is 6.     Cranial Nerves: Cranial nerves 2-12 are intact.     Sensory: Sensation is intact.     Motor: Motor function is intact.     Coordination: Coordination is intact.   Psychiatric:        Attention and Perception: Attention normal.        Mood and Affect: Mood normal.        Speech: Speech normal.        Behavior: Behavior normal.     ED Results / Procedures / Treatments   Labs (all labs ordered are listed, but only abnormal results are displayed) Labs Reviewed  CBC WITH DIFFERENTIAL/PLATELET - Abnormal;  Notable for the following components:      Result Value   RBC 5.14 (*)    MCV 79.6 (*)    Lymphs Abs 0.5 (*)    All other components within normal limits  COMPREHENSIVE METABOLIC PANEL - Abnormal; Notable for the following components:   Sodium 132 (*)    Potassium 2.1 (*)    Chloride 96 (*)    Glucose, Bld 139 (*)    BUN 32 (*)    Creatinine, Ser 2.14 (*)    Calcium 8.8 (*)    GFR, Estimated 23 (*)    All other components within normal limits  URINALYSIS, ROUTINE W REFLEX MICROSCOPIC - Abnormal; Notable for the following components:   APPearance HAZY (*)    Hgb urine dipstick MODERATE (*)    Ketones, ur 5 (*)    Protein, ur 100 (*)    Bacteria, UA RARE (*)    All other components within normal limits  CBG MONITORING, ED - Abnormal; Notable for the following components:   Glucose-Capillary 160 (*)    All other components within normal limits  RESP PANEL BY RT-PCR (RSV, FLU A&B, COVID)  RVPGX2  C DIFFICILE QUICK SCREEN W PCR REFLEX    GASTROINTESTINAL PANEL BY PCR, STOOL (REPLACES STOOL CULTURE)  LIPASE, BLOOD    EKG None  Radiology CT ABDOMEN PELVIS WO CONTRAST  Result Date: 11/01/2022 CLINICAL DATA:  Patient with a recently diagnosed non-small cell lung cancer right upper lobe, presents to the ED with nonlocalized acute abdomen pain and diarrhea since 10/25/2022. EXAM: CT ABDOMEN AND PELVIS WITHOUT CONTRAST TECHNIQUE: Multidetector CT imaging of the abdomen and pelvis was performed following the standard protocol without IV contrast. RADIATION DOSE REDUCTION: This exam was performed according to the departmental  dose-optimization program which includes automated exposure control, adjustment of the mA and/or kV according to patient size and/or use of iterative reconstruction technique. COMPARISON:  Chest CT without contrast 09/09/2022, and previous chest CTs dating back to 03/03/2022, as well as PET-CT 04/30/2022. FINDINGS: Lower chest: Stable 1 cm right lower lobe ground-glass nodule on 4:21. Scattered linear scarring or atelectasis both lower lobes. No acute process. The cardiac size is normal. Small stable pericardial effusion again noted and small hiatal hernia. Hepatobiliary: Capsular nodularity along the left lobe is noted consistent with cirrhosis. There is no dilatation of the hepatic portal main vein. The gallbladder is mildly distended without calcified stones, wall thickening or biliary dilatation. The unenhanced liver disease not show evidence of a mass. There is mild steatosis with hepatic length measurement 17 cm. Pancreas: There is moderate partial pancreatic atrophy. The unenhanced pancreas otherwise unremarkable without contrast. Spleen: Unremarkable without contrast.  No splenomegaly. Adrenals/Urinary Tract: There is no adrenal mass. Right kidney demonstrates a homogeneous thin walled 2 cm cyst in the outer mid to lower pole cortex, Hounsfield density of 12.4. There is an 8 mm moderately dense exophytic lesion in the superior pole of this kidney which is too small to characterize. Both unenhanced kidneys are otherwise unremarkable. No follow-up imaging is recommended. For reference see JACR 2018 Feb; 264-273, Management of the Incidental Renal Mass on CT, [employer] 2021; 814-848, Bosniak Classification of Cystic Renal Masses, Version 2019. There is no urinary stone or obstruction. The bladder is unremarkable allowing for the degree of distention. Stomach/Bowel: Small hiatal hernia. The gastric wall is contracted. There is no small bowel dilatation but there is fluid filling of normal caliber small bowel  in the mid to lower abdomen with  fluid levels compatible with ileus/enteritis There is no small bowel obstruction or inflammatory change. An appendix is not seen in this patient. There is fluid in the colon as well, but again no wall thickening. There are uncomplicated sigmoid diverticula. Vascular/Lymphatic: Aortic atherosclerosis. No enlarged abdominal or pelvic lymph nodes. Reproductive: The uterus is intact. There are small calcified uterine fibroids. No adnexal mass is seen. Other: There is no free air, free hemorrhage, free fluid or incarcerated hernia. There are small inguinal fat hernias. Musculoskeletal: There is lumbar facet hypertrophy greatest at L4-5 where there is grade 1 degenerative anterolisthesis. Osteopenia. No acute or other significant osseous findings. Asymmetrically advanced left hip DJD. IMPRESSION: 1. Fluid in the small bowel and colon consistent with ileus/enteritis. No bowel obstruction or inflammatory change. 2. Diverticulosis without evidence of diverticulitis. 3. Hepatic cirrhosis with mild steatosis. No ascites, portal vein dilatation or splenomegaly. 4. Aortic atherosclerosis. 5. Stable 1 cm right lower lobe ground-glass nodule. 6. Small hiatal hernia.  Small inguinal fat hernias. 7. Osteopenia and degenerative change. Aortic Atherosclerosis (ICD10-I70.0). Electronically Signed   By: Telford Nab M.D.   On: 11/01/2022 05:52    Procedures Procedures    Medications Ordered in ED Medications  potassium chloride 10 mEq in 100 mL IVPB (has no administration in time range)  0.9 % NaCl with KCl 40 mEq / L  infusion ( Intravenous New Bag/Given 11/01/22 0539)  sodium chloride 0.9 % bolus 500 mL (0 mLs Intravenous Stopped 11/01/22 0339)  ondansetron (ZOFRAN) injection 4 mg (4 mg Intravenous Given 11/01/22 6759)    ED Course/ Medical Decision Making/ A&P                           Medical Decision Making Amount and/or Complexity of Data Reviewed External Data Reviewed: labs,  radiology and notes. Labs: ordered. Decision-making details documented in ED Course. Radiology: ordered and independent interpretation performed. Decision-making details documented in ED Course. ECG/medicine tests: ordered and independent interpretation performed. Decision-making details documented in ED Course.  Risk Prescription drug management.   Resents to the emergency department for evaluation of diarrhea.  Patient having frequent, profuse watery diarrhea for 1 week.  Abdominal exam is benign.  Patient does not have a leukocytosis.  She is afebrile.  No history of melena or rectal bleeding.  Hemoglobin normal.  Patient's chemistries show acute kidney injury.  Additionally she is profoundly hypokalemic, secondary to the diarrhea.  Repletion initiated with IV potassium.  A CT scan was performed to further evaluate.  She has an enteritis picture, no other acute findings.  C. difficile is negative.  Gastrointestinal panel pending.  COVID, influenza, RSV negative.  Will admit for further potassium replacement and treatment for diarrhea.        Final Clinical Impression(s) / ED Diagnoses Final diagnoses:  Diarrhea, unspecified type  Hypokalemia  AKI (acute kidney injury) [facility])    Rx / DC Orders ED Discharge Orders     None         Torie Priebe, Gwenyth Allegra, MD 11/01/22 [phone]

## 2022-11-01 NOTE — ED Notes (Signed)
Breakfast tray given. °

## 2022-11-01 NOTE — ED Triage Notes (Signed)
Pt to ed via [facility] from home. C/o liquid diarrhea for 6xdays (since Saturday). Pt has been drinking powerade and taking immodium at home with no relief.

## 2022-11-01 NOTE — ED Notes (Signed)
Lunch tray given. 

## 2022-11-01 NOTE — H&P (Signed)
History and Physical    Renee Maldonado ULA:[id number] DOB: 1946-01-22 DOA: 11/01/2022  PCP: Claretta Fraise, MD   Patient coming from: Home  Chief Complaint: Diarrhea  HPI: Renee Maldonado is a 76 y.o. female with medical history significant for hypertension, dyslipidemia, anxiety disorder, COPD, GERD, and prior thyroid cancer with resection who presented to the ED with ongoing profuse, watery diarrhea that has been ongoing for the last 1 week.  She denies any abdominal pain, nausea, or vomiting.  She appears to have multiple episodes per day every few minutes and was recently given some Imodium by her PCP with no relief.  She denies any recent antibiotic use, fevers, or chills.  No recent sick contacts or travel or strange food consumption noted.   ED Course: Vital signs stable and patient is afebrile.  Sodium is mildly decreased and potassium is 2.1.  Creatinine is elevated compared to baseline suggesting AKI.  Enteritis noted on CT scan.  COVID, RSV, and flu testing negative.  C. difficile negative and GI pathogen panel pending.  Review of Systems: Reviewed as noted above, otherwise negative.  Past Medical History:  Diagnosis Date   Anxiety    Arthritis    Cancer ([place])    breast Right no chemo or radiation  on Arimidex   COPD (chronic obstructive pulmonary disease) ([place])    on Breo   GERD (gastroesophageal reflux disease)    Humerus fracture 2007   Hyperlipidemia    Hypertension    Insomnia    OA (osteoarthritis) of knee 10/08/2020   Thyroid cancer ([place]) 2023    Past Surgical History:  Procedure Laterality Date   BREAST LUMPECTOMY Right 02/2019   BREAST SURGERY Right 01/2019   breast biopsy   BRONCHIAL BIOPSY  04/15/2022   Procedure: BRONCHIAL BIOPSIES;  Surgeon: Garner Nash, DO;  Location: [facility];  Service: Pulmonary;;   BRONCHIAL BRUSHINGS  04/15/2022   Procedure: BRONCHIAL BRUSHINGS;  Surgeon: Garner Nash, DO;  Location: [facility];  Service: Pulmonary;;    BRONCHIAL NEEDLE ASPIRATION BIOPSY  04/15/2022   Procedure: BRONCHIAL NEEDLE ASPIRATION BIOPSIES;  Surgeon: Garner Nash, DO;  Location: [facility];  Service: Pulmonary;;   COLONOSCOPY  2022   FIDUCIAL MARKER PLACEMENT  04/15/2022   Procedure: FIDUCIAL MARKER PLACEMENT;  Surgeon: Garner Nash, DO;  Location: [facility];  Service: Pulmonary;;   JOINT REPLACEMENT     left knee   Left shoulder surgery     2007   RADICAL NECK DISSECTION Right 10/10/2022   Procedure: CENTRAL NECK DISSECTION;  Surgeon: Jenetta Downer, MD;  Location: [facility];  Service: ENT;  Laterality: Right;   THYROIDECTOMY Right 10/10/2022   Procedure: RIGHT THYROID LOBECTOMY;  Surgeon: Jenetta Downer, MD;  Location: [facility];  Service: ENT;  Laterality: Right;   TOTAL KNEE ARTHROPLASTY     2012 left   TOTAL KNEE ARTHROPLASTY Right 10/08/2020   Procedure: TOTAL KNEE ARTHROPLASTY;  Surgeon: Gaynelle Arabian, MD;  Location: [facility];  Service: Orthopedics;  Laterality: Right;   TUBAL LIGATION  1978   VIDEO BRONCHOSCOPY WITH RADIAL ENDOBRONCHIAL ULTRASOUND  04/15/2022   Procedure: VIDEO BRONCHOSCOPY WITH RADIAL ENDOBRONCHIAL ULTRASOUND;  Surgeon: Garner Nash, DO;  Location: [facility];  Service: Pulmonary;;     reports that she quit smoking about 8 years ago. Her smoking use included cigarettes. She has a 10.00 pack-year smoking history. She has never used smokeless tobacco. She reports current alcohol use of about 4.0 standard drinks of alcohol per week.  She reports that she does not use drugs.  Allergies  Allergen Reactions   Benadryl [Diphenhydramine Hcl] Nausea Only    Dizziness   Penicillins     Had a rash around 20 years ago but unsure if it come from medicine.   Tolerated Cephalosporin Date: 10/09/20.      Family History  Problem Relation Age of Onset   Alzheimer's disease Mother    Diabetes Mother    Stroke Father    Hypertension Father    Cancer Brother     Prior to Admission medications    Medication Sig Start Date End Date Taking? Authorizing Provider  albuterol (VENTOLIN HFA) 108 (90 Base) MCG/ACT inhaler 2 puffs in lungs every 6 hours as needed for wheezing/short of breath. 12/13/20   Claretta Fraise, MD  amLODipine (NORVASC) 5 MG tablet Take 1 tablet (5 mg total) by mouth daily. 07/14/22   Claretta Fraise, MD  anastrozole (ARIMIDEX) 1 MG tablet Take 1 mg by mouth in the morning. 07/22/19   [provider]  aspirin 500 MG tablet Take 500 mg by mouth every 6 (six) hours as needed (pain.).    [provider]  Calcium Carb-Cholecalciferol 600-20 MG-MCG TABS Take 1 tablet by mouth in the morning.    [provider]  chlorpheniramine (CHLOR-TRIMETON) 4 MG tablet Take 4 mg by mouth See admin instructions. Take 1 tablet (4 mg) by mouth scheduled every night & may take an additional dose in the morning if needed for allergies.    [provider]  cholecalciferol (VITAMIN D3) 25 MCG (1000 UNIT) tablet Take 1,000 Units by mouth in the morning.    [provider]  cyanocobalamin (VITAMIN B12) 1000 MCG/ML injection Inject 1 mL (1,000 mcg total) into the skin every 30 (thirty) days. 07/14/22   Claretta Fraise, MD  famotidine (PEPCID) 40 MG tablet Take 1 tablet (40 mg total) by mouth daily. 07/14/22   Claretta Fraise, MD  fluticasone furoate-vilanterol (BREO ELLIPTA) 200-25 MCG/ACT AEPB Inhale 1 puff into the lungs daily. Patient taking differently: Inhale 1 puff into the lungs 4 (four) times a week. 07/14/22   Claretta Fraise, MD  hydrochlorothiazide (HYDRODIURIL) 25 MG tablet Take 1 tablet (25 mg total) by mouth daily. 07/14/22   Claretta Fraise, MD  hydrocortisone cream 1 % Apply 1 Application topically daily as needed (dry/itchy skin on back).    [provider]  hydrOXYzine (ATARAX/VISTARIL) 25 MG tablet Take 25 mg by mouth every 4 (four) hours as needed for itching.    [provider]  L-Lysine 500 MG TABS Take 500 mg by mouth every evening.     [provider]  Melatonin 10 MG CAPS Take 10 mg by mouth at bedtime.    [provider]  ondansetron (ZOFRAN) 4 MG tablet Take 1 tablet (4 mg total) by mouth every 8 (eight) hours as needed for nausea or vomiting. 10/31/22   Hassell Done, Mary-Margaret, FNP  Polyethyl Glycol-Propyl Glycol (LUBRICANT EYE DROPS) 0.4-0.3 % SOLN Place 1-2 drops into both eyes 3 (three) times daily as needed (dry/irritated eyes.).    [provider]  raloxifene (EVISTA) 60 MG tablet Take 1 tablet (60 mg total) by mouth daily. 07/14/22   Claretta Fraise, MD  RESTASIS 0.05 % ophthalmic emulsion Place 1 drop into both eyes 2 (two) times daily as needed (dry eyes).  01/18/14   [provider]  rosuvastatin (CRESTOR) 20 MG tablet TAKE 1 TABLET DAILY 10/28/22   Claretta Fraise, MD  sodium  chloride (OCEAN) 0.65 % SOLN nasal spray Place 1 spray into both nostrils as needed for congestion.    [provider]  traZODone (DESYREL) 100 MG tablet TAKE 1 TABLET AT BEDTIME 05/12/22   Claretta Fraise, MD  valACYclovir (VALTREX) 500 MG tablet TAKE (1) TABLET DAILY AS NEEDED. Patient taking differently: Take 500 mg by mouth daily as needed (fever blisters.). 12/13/20   Claretta Fraise, MD    Physical Exam: Vitals:   11/01/22 0530 11/01/22 0600 11/01/22 0630 11/01/22 0732  BP: 114/60   130/70  Pulse: 85 87 88 88  Resp: 17 13 16 11   Temp:    98.2 F (36.8 C)  TempSrc:    Oral  SpO2: 93% 98% 95% 97%  Weight:      Height:        Constitutional: NAD, calm, comfortable Vitals:   11/01/22 0530 11/01/22 0600 11/01/22 0630 11/01/22 0732  BP: 114/60   130/70  Pulse: 85 87 88 88  Resp: 17 13 16 11   Temp:    98.2 F (36.8 C)  TempSrc:    Oral  SpO2: 93% 98% 95% 97%  Weight:      Height:       Eyes: lids and conjunctivae normal Neck: normal, supple Respiratory: clear to auscultation bilaterally. Normal respiratory effort. No accessory muscle use.  Cardiovascular: Regular rate and rhythm, no  murmurs. Abdomen: no tenderness, no distention. Bowel sounds positive.  Musculoskeletal:  No edema. Skin: no rashes, lesions, ulcers.  Psychiatric: Flat affect  Labs on Admission: I have personally reviewed following labs and imaging studies  CBC: Recent Labs  Lab 11/01/22 0252  WBC 4.4  NEUTROABS 3.3  HGB 13.8  HCT 40.9  MCV 79.6*  PLT 417   Basic Metabolic Panel: Recent Labs  Lab 11/01/22 0252  NA 132*  K 2.1*  CL 96*  CO2 25  GLUCOSE 139*  BUN 32*  CREATININE 2.14*  CALCIUM 8.8*   GFR: Estimated Creatinine Clearance: 21.8 mL/min (A) (by C-G formula based on SCr of 2.14 mg/dL (H)). Liver Function Tests: Recent Labs  Lab 11/01/22 0252  AST 27  ALT 21  ALKPHOS 69  BILITOT 1.1  PROT 7.8  ALBUMIN 4.5   Recent Labs  Lab 11/01/22 0252  LIPASE 26   No results for input(s): "AMMONIA" in the last 168 hours. Coagulation Profile: No results for input(s): "INR", "PROTIME" in the last 168 hours. Cardiac Enzymes: No results for input(s): "CKTOTAL", "CKMB", "CKMBINDEX", "TROPONINI" in the last 168 hours. BNP (last 3 results) No results for input(s): "PROBNP" in the last 8760 hours. HbA1C: No results for input(s): "HGBA1C" in the last 72 hours. CBG: Recent Labs  Lab 11/01/22 0214  GLUCAP 160*   Lipid Profile: No results for input(s): "CHOL", "HDL", "LDLCALC", "TRIG", "CHOLHDL", "LDLDIRECT" in the last 72 hours. Thyroid Function Tests: No results for input(s): "TSH", "T4TOTAL", "FREET4", "T3FREE", "THYROIDAB" in the last 72 hours. Anemia Panel: No results for input(s): "VITAMINB12", "FOLATE", "FERRITIN", "TIBC", "IRON", "RETICCTPCT" in the last 72 hours. Urine analysis:    Component Value Date/Time   COLORURINE YELLOW 11/01/2022 0332   APPEARANCEUR HAZY (A) 11/01/2022 0332   APPEARANCEUR Clear 02/08/2018 1042   LABSPEC 1.015 11/01/2022 0332   PHURINE 5.0 11/01/2022 0332   GLUCOSEU NEGATIVE 11/01/2022 0332   HGBUR MODERATE (A) 11/01/2022 0332    BILIRUBINUR NEGATIVE 11/01/2022 0332   BILIRUBINUR Negative 02/08/2018 1042   KETONESUR 5 (A) 11/01/2022 0332   PROTEINUR 100 (A) 11/01/2022 4081  UROBILINOGEN negative 04/03/2015 1039   NITRITE NEGATIVE 11/01/2022 0332   LEUKOCYTESUR NEGATIVE 11/01/2022 0332    Radiological Exams on Admission: CT ABDOMEN PELVIS WO CONTRAST  Result Date: 11/01/2022 CLINICAL DATA:  Patient with a recently diagnosed non-small cell lung cancer right upper lobe, presents to the ED with nonlocalized acute abdomen pain and diarrhea since 10/25/2022. EXAM: CT ABDOMEN AND PELVIS WITHOUT CONTRAST TECHNIQUE: Multidetector CT imaging of the abdomen and pelvis was performed following the standard protocol without IV contrast. RADIATION DOSE REDUCTION: This exam was performed according to the departmental dose-optimization program which includes automated exposure control, adjustment of the mA and/or kV according to patient size and/or use of iterative reconstruction technique. COMPARISON:  Chest CT without contrast 09/09/2022, and previous chest CTs dating back to 03/03/2022, as well as PET-CT 04/30/2022. FINDINGS: Lower chest: Stable 1 cm right lower lobe ground-glass nodule on 4:21. Scattered linear scarring or atelectasis both lower lobes. No acute process. The cardiac size is normal. Small stable pericardial effusion again noted and small hiatal hernia. Hepatobiliary: Capsular nodularity along the left lobe is noted consistent with cirrhosis. There is no dilatation of the hepatic portal main vein. The gallbladder is mildly distended without calcified stones, wall thickening or biliary dilatation. The unenhanced liver disease not show evidence of a mass. There is mild steatosis with hepatic length measurement 17 cm. Pancreas: There is moderate partial pancreatic atrophy. The unenhanced pancreas otherwise unremarkable without contrast. Spleen: Unremarkable without contrast.  No splenomegaly. Adrenals/Urinary Tract: There is no  adrenal mass. Right kidney demonstrates a homogeneous thin walled 2 cm cyst in the outer mid to lower pole cortex, Hounsfield density of 12.4. There is an 8 mm moderately dense exophytic lesion in the superior pole of this kidney which is too small to characterize. Both unenhanced kidneys are otherwise unremarkable. No follow-up imaging is recommended. For reference see JACR 2018 Feb; 264-273, Management of the Incidental Renal Mass on CT, [employer] 2021; 814-848, Bosniak Classification of Cystic Renal Masses, Version 2019. There is no urinary stone or obstruction. The bladder is unremarkable allowing for the degree of distention. Stomach/Bowel: Small hiatal hernia. The gastric wall is contracted. There is no small bowel dilatation but there is fluid filling of normal caliber small bowel in the mid to lower abdomen with fluid levels compatible with ileus/enteritis There is no small bowel obstruction or inflammatory change. An appendix is not seen in this patient. There is fluid in the colon as well, but again no wall thickening. There are uncomplicated sigmoid diverticula. Vascular/Lymphatic: Aortic atherosclerosis. No enlarged abdominal or pelvic lymph nodes. Reproductive: The uterus is intact. There are small calcified uterine fibroids. No adnexal mass is seen. Other: There is no free air, free hemorrhage, free fluid or incarcerated hernia. There are small inguinal fat hernias. Musculoskeletal: There is lumbar facet hypertrophy greatest at L4-5 where there is grade 1 degenerative anterolisthesis. Osteopenia. No acute or other significant osseous findings. Asymmetrically advanced left hip DJD. IMPRESSION: 1. Fluid in the small bowel and colon consistent with ileus/enteritis. No bowel obstruction or inflammatory change. 2. Diverticulosis without evidence of diverticulitis. 3. Hepatic cirrhosis with mild steatosis. No ascites, portal vein dilatation or splenomegaly. 4. Aortic atherosclerosis. 5. Stable 1 cm right  lower lobe ground-glass nodule. 6. Small hiatal hernia.  Small inguinal fat hernias. 7. Osteopenia and degenerative change. Aortic Atherosclerosis (ICD10-I70.0). Electronically Signed   By: Telford Nab M.D.   On: 11/01/2022 05:52     Assessment/Plan Principal Problem:   Hypokalemia Active Problems:  Hyperlipidemia   Hypertension   GAD (generalized anxiety disorder)   GERD (gastroesophageal reflux disease)   COPD (chronic obstructive pulmonary disease) ([facility])   Primary osteoarthritis of right knee   Papillary thyroid carcinoma ([facility])    Severe hypokalemia secondary to diarrhea -Replete aggressively and monitor on telemetry -Awaiting GI pathogen panel, C. difficile negative -Start clear liquid diet for now -Imodium as needed  AKI on CKD stage IIIa -Appears prerenal in the setting of ongoing diarrhea -Aggressive IV fluid -Avoid nephrotoxic agents -Monitor repeat labs  Mild hyponatremia -Likely related to dehydration, monitor on normal saline infusion -Also uses HCTZ  Dyslipidemia -Continue home medications  Hypertension -Hold home HCTZ and continue other medications  GERD -Continue on PPI  History of COPD with prior tobacco abuse -Continue Breo -Rescue inhalers as needed  Obesity -BMI 35.45   DVT prophylaxis: Heparin Code Status: Full Family Communication: Daughter at bedside Disposition Plan:Admit for hypokalemia tx and rehydration Consults called:None Admission status: Inpatient, SDU  Severity of Illness: The appropriate patient status for this patient is INPATIENT. Inpatient status is judged to be reasonable and necessary in order to provide the required intensity of service to ensure the patient's safety. The patient's presenting symptoms, physical exam findings, and initial radiographic and laboratory data in the context of their chronic comorbidities is felt to place them at high risk for further clinical deterioration. Furthermore, it is not anticipated  that the patient will be medically stable for discharge from the hospital within 2 midnights of admission.   * I certify that at the point of admission it is my clinical judgment that the patient will require inpatient hospital care spanning beyond 2 midnights from the point of admission due to high intensity of service, high risk for further deterioration and high frequency of surveillance required.*   Renee Maldonado D Renee Longhi DO [facility]  If 7PM-7AM, please contact night-coverage [email or web address]  11/01/2022, 7:58 AM

## 2022-11-01 NOTE — ED Notes (Addendum)
CBG:160

## 2022-11-01 NOTE — ED Notes (Signed)
Tat, MD requested repeat potassium labs after pt has finished 5th round of 10 mEq/100 ml IVPB.

## 2022-11-02 DIAGNOSIS — E876 Hypokalemia: Secondary | ICD-10-CM | POA: Diagnosis not present

## 2022-11-02 LAB — COMPREHENSIVE METABOLIC PANEL
ALT: 16 U/L (ref 0–44)
AST: 16 U/L (ref 15–41)
Albumin: 3.8 g/dL (ref 3.5–5.0)
Alkaline Phosphatase: 61 U/L (ref 38–126)
Anion gap: 9 (ref 5–15)
BUN: 25 mg/dL — ABNORMAL HIGH (ref 8–23)
CO2: 19 mmol/L — ABNORMAL LOW (ref 22–32)
Calcium: 8.3 mg/dL — ABNORMAL LOW (ref 8.9–10.3)
Chloride: 112 mmol/L — ABNORMAL HIGH (ref 98–111)
Creatinine, Ser: 1.75 mg/dL — ABNORMAL HIGH (ref 0.44–1.00)
GFR, Estimated: 30 mL/min — ABNORMAL LOW (ref >60)
Glucose, Bld: 138 mg/dL — ABNORMAL HIGH (ref 70–99)
Potassium: 3.2 mmol/L — ABNORMAL LOW (ref 3.5–5.1)
Sodium: 140 mmol/L (ref 135–145)
Total Bilirubin: 0.7 mg/dL (ref 0.3–1.2)
Total Protein: 6.7 g/dL (ref 6.5–8.1)

## 2022-11-02 LAB — CBC
HCT: 38.1 % (ref 36.0–46.0)
Hemoglobin: 12.9 g/dL (ref 12.0–15.0)
MCH: 27.3 pg (ref 26.0–34.0)
MCHC: 33.9 g/dL (ref 30.0–36.0)
MCV: 80.5 fL (ref 80.0–100.0)
Platelets: 259 10*3/uL (ref 150–400)
RBC: 4.73 MIL/uL (ref 3.87–5.11)
RDW: 13.3 % (ref 11.5–15.5)
WBC: 2.6 10*3/uL — ABNORMAL LOW (ref 4.0–10.5)
nRBC: 0 % (ref 0.0–0.2)

## 2022-11-02 LAB — MRSA NEXT GEN BY PCR, NASAL: MRSA by PCR Next Gen: NOT DETECTED

## 2022-11-02 LAB — MAGNESIUM: Magnesium: 1.7 mg/dL (ref 1.7–2.4)

## 2022-11-02 NOTE — Progress Notes (Signed)
PROGRESS NOTE    Renee Maldonado  OFB:[id number] DOB: [birth year] DOA: 11/01/2022 PCP: Claretta Fraise, MD   Brief Narrative:  Renee Maldonado is a 76 y.o. female with medical history significant for hypertension, dyslipidemia, anxiety disorder, COPD, GERD, and prior thyroid cancer with resection who presented to the ED with ongoing profuse, watery diarrhea that has been ongoing for the last 1 week.  She was admitted for evaluation of diarrhea with associated severe hypokalemia as well as some AKI related to this.  Assessment & Plan:   Principal Problem:   Hypokalemia Active Problems:   Hyperlipidemia   Hypertension   GAD (generalized anxiety disorder)   GERD (gastroesophageal reflux disease)   COPD (chronic obstructive pulmonary disease) (HCC)   Primary osteoarthritis of right knee   Papillary thyroid carcinoma (HCC)  Assessment and Plan:   Severe hypokalemia secondary to diarrhea-improving -Replete aggressively and monitor on telemetry -Awaiting GI pathogen panel, C. difficile negative -Start clear liquid diet for now and advance diet as diarrhea improves -Imodium as needed -Okay to transfer to telemetry since potassium improved   AKI on CKD stage IIIa-improving -Appears prerenal in the setting of ongoing diarrhea -Aggressive IV fluid ongoing -Avoid nephrotoxic agents -Monitor repeat labs   Mild hyponatremia-resolved -Likely related to dehydration, monitor on normal saline infusion -Also uses HCTZ   Dyslipidemia -Continue home medications   Hypertension -Hold home HCTZ and continue other medications   GERD -Continue on PPI   History of COPD with prior tobacco abuse -Continue Breo -Rescue inhalers as needed   Obesity -BMI 35.45      DVT prophylaxis: Heparin Code Status: Full Family Communication: Daughter at bedside 12/16 Disposition Plan:  Status is: Inpatient Remains inpatient appropriate because: Need for IV medications.  Consultants:   None  Procedures:  None  Antimicrobials:  None   Subjective: Patient seen and evaluated today with ongoing liquidy bowel movements.  She is stating that the rectal tube is giving her quite a bit of discomfort, but understands that she needs to have it.  Objective: Vitals:   11/02/22 0600 11/02/22 0700 11/02/22 0800 11/02/22 0804  BP: (!) 136/48 (!) 135/55 (!) 133/52   Pulse: 87 86 89   Resp: 19 (!) 21 15   Temp:   (!) 97.5 F (36.4 C)   TempSrc:   Oral   SpO2: 95% 97% 98% 98%  Weight:      Height:        Intake/Output Summary (Last 24 hours) at 11/02/2022 0913 Last data filed at 11/02/2022 0600 Gross per 24 hour  Intake 2195.81 ml  Output --  Net 2195.81 ml   Filed Weights   11/01/22 0215 11/02/22 0534  Weight: 85.1 kg 74.7 kg    Examination:  General exam: Appears calm and comfortable  Respiratory system: Clear to auscultation. Respiratory effort normal. Cardiovascular system: S1 & S2 heard, RRR.  Gastrointestinal system: Abdomen is soft Central nervous system: Alert and awake Extremities: No edema Skin: No significant lesions noted Psychiatry: Flat affect.    Data Reviewed: I have personally reviewed following labs and imaging studies  CBC: Recent Labs  Lab 11/01/22 0252 11/02/22 0526  WBC 4.4 2.6*  NEUTROABS 3.3  --   HGB 13.8 12.9  HCT 40.9 38.1  MCV 79.6* 80.5  PLT 289 782   Basic Metabolic Panel: Recent Labs  Lab 11/01/22 0252 11/01/22 1541 11/02/22 0526  NA 132*  --  140  K 2.1* 3.4* 3.2*  CL 96*  --  112*  CO2 25  --  19*  GLUCOSE 139*  --  138*  BUN 32*  --  25*  CREATININE 2.14*  --  1.75*  CALCIUM 8.8*  --  8.3*  MG  --   --  1.7   GFR: Estimated Creatinine Clearance: 24.9 mL/min (A) (by C-G formula based on SCr of 1.75 mg/dL (H)). Liver Function Tests: Recent Labs  Lab 11/01/22 0252 11/02/22 0526  AST 27 16  ALT 21 16  ALKPHOS 69 61  BILITOT 1.1 0.7  PROT 7.8 6.7  ALBUMIN 4.5 3.8   Recent Labs  Lab  11/01/22 0252  LIPASE 26   No results for input(s): "AMMONIA" in the last 168 hours. Coagulation Profile: No results for input(s): "INR", "PROTIME" in the last 168 hours. Cardiac Enzymes: No results for input(s): "CKTOTAL", "CKMB", "CKMBINDEX", "TROPONINI" in the last 168 hours. BNP (last 3 results) No results for input(s): "PROBNP" in the last 8760 hours. HbA1C: No results for input(s): "HGBA1C" in the last 72 hours. CBG: Recent Labs  Lab 11/01/22 0214  GLUCAP 160*   Lipid Profile: No results for input(s): "CHOL", "HDL", "LDLCALC", "TRIG", "CHOLHDL", "LDLDIRECT" in the last 72 hours. Thyroid Function Tests: No results for input(s): "TSH", "T4TOTAL", "FREET4", "T3FREE", "THYROIDAB" in the last 72 hours. Anemia Panel: No results for input(s): "VITAMINB12", "FOLATE", "FERRITIN", "TIBC", "IRON", "RETICCTPCT" in the last 72 hours. Sepsis Labs: No results for input(s): "PROCALCITON", "LATICACIDVEN" in the last 168 hours.  Recent Results (from the past 240 hour(s))  Resp panel by RT-PCR (RSV, Flu A&B, Covid) Anterior Nasal Swab     Status: None   Collection Time: 11/01/22  2:39 AM   Specimen: Anterior Nasal Swab  Result Value Ref Range Status   SARS Coronavirus 2 by RT PCR NEGATIVE NEGATIVE Final    Comment: (NOTE) SARS-CoV-2 target nucleic acids are NOT DETECTED.  The SARS-CoV-2 RNA is generally detectable in upper respiratory specimens during the acute phase of infection. The lowest concentration of SARS-CoV-2 viral copies this assay can detect is 138 copies/mL. A negative result does not preclude SARS-Cov-2 infection and should not be used as the sole basis for treatment or other patient management decisions. A negative result may occur with  improper specimen collection/handling, submission of specimen other than nasopharyngeal swab, presence of viral mutation(s) within the areas targeted by this assay, and inadequate number of viral copies(<138 copies/mL). A negative  result must be combined with clinical observations, patient history, and epidemiological information. The expected result is Negative.  Fact Sheet for Patients:  EntrepreneurPulse.com.au  Fact Sheet for Healthcare Providers:  IncredibleEmployment.be  This test is no t yet approved or cleared by the [place] FDA and  has been authorized for detection and/or diagnosis of SARS-CoV-2 by FDA under an Emergency Use Authorization (EUA). This EUA will remain  in effect (meaning this test can be used) for the duration of the COVID-19 declaration under Section 564(b)(1) of the Act, 21 U.S.C.section 360bbb-3(b)(1), unless the authorization is terminated  or revoked sooner.       Influenza A by PCR NEGATIVE NEGATIVE Final   Influenza B by PCR NEGATIVE NEGATIVE Final    Comment: (NOTE) The Xpert Xpress SARS-CoV-2/FLU/RSV plus assay is intended as an aid in the diagnosis of influenza from Nasopharyngeal swab specimens and should not be used as a sole basis for treatment. Nasal washings and aspirates are unacceptable for Xpert Xpress SARS-CoV-2/FLU/RSV testing.  Fact Sheet for Patients: EntrepreneurPulse.com.au  Fact Sheet for Healthcare Providers: IncredibleEmployment.be  This  test is not yet approved or cleared by the [place] and has been authorized for detection and/or diagnosis of SARS-CoV-2 by FDA under an Emergency Use Authorization (EUA). This EUA will remain in effect (meaning this test can be used) for the duration of the COVID-19 declaration under Section 564(b)(1) of the Act, 21 U.S.C. section 360bbb-3(b)(1), unless the authorization is terminated or revoked.     Resp Syncytial Virus by PCR NEGATIVE NEGATIVE Final    Comment: (NOTE) Fact Sheet for Patients: EntrepreneurPulse.com.au  Fact Sheet for Healthcare Providers: IncredibleEmployment.be  This  test is not yet approved or cleared by the [place] FDA and has been authorized for detection and/or diagnosis of SARS-CoV-2 by FDA under an Emergency Use Authorization (EUA). This EUA will remain in effect (meaning this test can be used) for the duration of the COVID-19 declaration under Section 564(b)(1) of the Act, 21 U.S.C. section 360bbb-3(b)(1), unless the authorization is terminated or revoked.  Performed at [facility], [address] [place], [place] [address]   C Difficile Quick Screen w PCR reflex     Status: None   Collection Time: 11/01/22  2:49 AM   Specimen: Urine, In & Out Cath; Stool  Result Value Ref Range Status   C Diff antigen NEGATIVE NEGATIVE Final   C Diff toxin NEGATIVE NEGATIVE Final   C Diff interpretation No C. difficile detected.  Final    Comment: Performed at [facility], [address] [place], [place] [address]  MRSA Next Gen by PCR, Nasal     Status: None   Collection Time: 11/01/22  3:34 PM   Specimen: Nasal Mucosa; Nasal Swab  Result Value Ref Range Status   MRSA by PCR Next Gen NOT DETECTED NOT DETECTED Final    Comment: (NOTE) The GeneXpert MRSA Assay (FDA approved for NASAL specimens only), is one component of a comprehensive MRSA colonization surveillance program. It is not intended to diagnose MRSA infection nor to guide or monitor treatment for MRSA infections. Test performance is not FDA approved in patients less than 49 years old. Performed at [facility], [address], [place], [place] [address]          Radiology Studies: CT ABDOMEN PELVIS WO CONTRAST  Result Date: 11/01/2022 CLINICAL DATA:  Patient with a recently diagnosed non-small cell lung cancer right upper lobe, presents to the ED with nonlocalized acute abdomen pain and diarrhea since 10/25/2022. EXAM: CT ABDOMEN AND PELVIS WITHOUT CONTRAST TECHNIQUE: Multidetector CT imaging of the abdomen and pelvis was performed following the standard protocol without IV  contrast. RADIATION DOSE REDUCTION: This exam was performed according to the departmental dose-optimization program which includes automated exposure control, adjustment of the mA and/or kV according to patient size and/or use of iterative reconstruction technique. COMPARISON:  Chest CT without contrast 09/09/2022, and previous chest CTs dating back to 03/03/2022, as well as PET-CT 04/30/2022. FINDINGS: Lower chest: Stable 1 cm right lower lobe ground-glass nodule on 4:21. Scattered linear scarring or atelectasis both lower lobes. No acute process. The cardiac size is normal. Small stable pericardial effusion again noted and small hiatal hernia. Hepatobiliary: Capsular nodularity along the left lobe is noted consistent with cirrhosis. There is no dilatation of the hepatic portal main vein. The gallbladder is mildly distended without calcified stones, wall thickening or biliary dilatation. The unenhanced liver disease not show evidence of a mass. There is mild steatosis with hepatic length measurement 17 cm. Pancreas: There is moderate partial pancreatic atrophy. The unenhanced pancreas  otherwise unremarkable without contrast. Spleen: Unremarkable without contrast.  No splenomegaly. Adrenals/Urinary Tract: There is no adrenal mass. Right kidney demonstrates a homogeneous thin walled 2 cm cyst in the outer mid to lower pole cortex, Hounsfield density of 12.4. There is an 8 mm moderately dense exophytic lesion in the superior pole of this kidney which is too small to characterize. Both unenhanced kidneys are otherwise unremarkable. No follow-up imaging is recommended. For reference see JACR 2018 Feb; 264-273, Management of the Incidental Renal Mass on CT, Radio[employer] 2021; 814-848, Bosniak Classification of Cystic Renal Masses, Version 2019. There is no urinary stone or obstruction. The bladder is unremarkable allowing for the degree of distention. Stomach/Bowel: Small hiatal hernia. The gastric wall is contracted.  There is no small bowel dilatation but there is fluid filling of normal caliber small bowel in the mid to lower abdomen with fluid levels compatible with ileus/enteritis There is no small bowel obstruction or inflammatory change. An appendix is not seen in this patient. There is fluid in the colon as well, but again no wall thickening. There are uncomplicated sigmoid diverticula. Vascular/Lymphatic: Aortic atherosclerosis. No enlarged abdominal or pelvic lymph nodes. Reproductive: The uterus is intact. There are small calcified uterine fibroids. No adnexal mass is seen. Other: There is no free air, free hemorrhage, free fluid or incarcerated hernia. There are small inguinal fat hernias. Musculoskeletal: There is lumbar facet hypertrophy greatest at L4-5 where there is grade 1 degenerative anterolisthesis. Osteopenia. No acute or other significant osseous findings. Asymmetrically advanced left hip DJD. IMPRESSION: 1. Fluid in the small bowel and colon consistent with ileus/enteritis. No bowel obstruction or inflammatory change. 2. Diverticulosis without evidence of diverticulitis. 3. Hepatic cirrhosis with mild steatosis. No ascites, portal vein dilatation or splenomegaly. 4. Aortic atherosclerosis. 5. Stable 1 cm right lower lobe ground-glass nodule. 6. Small hiatal hernia.  Small inguinal fat hernias. 7. Osteopenia and degenerative change. Aortic Atherosclerosis (ICD10-I70.0). Electronically Signed   By: Telford Nab M.D.   On: 11/01/2022 05:52        Scheduled Meds:  amLODipine  5 mg Oral Daily   anastrozole  1 mg Oral q AM   Chlorhexidine Gluconate Cloth  6 each Topical Daily   famotidine  10 mg Oral Daily   fluticasone furoate-vilanterol  1 puff Inhalation Once per day on Sun Tue Thu Sat   heparin  5,000 Units Subcutaneous Q8H   melatonin  9 mg Oral QHS   raloxifene  60 mg Oral Daily   rosuvastatin  20 mg Oral Daily   traZODone  100 mg Oral QHS   Continuous Infusions:  0.9 % NaCl with KCl 40  mEq / L 125 mL/hr at 11/02/22 0126     LOS: 1 day    Time spent: 35 minutes    Aracelie Addis Darleen Crocker, DO [facility]  If 7PM-7AM, please contact night-coverage [email or web address] 11/02/2022, 9:13 AM

## 2022-11-02 NOTE — TOC Progression Note (Signed)
Transition of Care [facility]) - Progression Note    Patient Details  Name: Michela Herst MRN: [id number] Date of Birth: 07-13-1946  Transition of Care [facility]) CM/SW Contact  Salome Arnt, [place] Phone Number: 11/02/2022, 11:13 AM  Clinical Narrative:   Transition of Care (TOC) Screening Note   Patient Details  Name: Aalivia Mcgraw Date of Birth: 05-21-1946   Transition of Care [facility]) CM/SW Contact:    Salome Arnt, [place] Phone Number: 11/02/2022, 11:13 AM    Transition of Care Department [facility]) has reviewed patient and no TOC needs have been identified at this time. We will continue to monitor patient advancement through interdisciplinary progression rounds. If new patient transition needs arise, please place a TOC consult.            Expected Discharge Plan and Services                                                 Social Determinants of Health (SDOH) Interventions    Readmission Risk Interventions     No data to display

## 2022-11-03 DIAGNOSIS — E876 Hypokalemia: Secondary | ICD-10-CM | POA: Diagnosis not present

## 2022-11-03 LAB — GASTROINTESTINAL PANEL BY PCR, STOOL (REPLACES STOOL CULTURE)

## 2022-11-03 LAB — BASIC METABOLIC PANEL
Anion gap: 8 (ref 5–15)
BUN: 30 mg/dL — ABNORMAL HIGH (ref 8–23)
CO2: 18 mmol/L — ABNORMAL LOW (ref 22–32)
Calcium: 8.5 mg/dL — ABNORMAL LOW (ref 8.9–10.3)
Chloride: 112 mmol/L — ABNORMAL HIGH (ref 98–111)
Creatinine, Ser: 1.97 mg/dL — ABNORMAL HIGH (ref 0.44–1.00)
GFR, Estimated: 26 mL/min — ABNORMAL LOW (ref >60)
Glucose, Bld: 143 mg/dL — ABNORMAL HIGH (ref 70–99)
Potassium: 4.1 mmol/L (ref 3.5–5.1)
Sodium: 138 mmol/L (ref 135–145)

## 2022-11-03 LAB — MAGNESIUM: Magnesium: 1.6 mg/dL — ABNORMAL LOW (ref 1.7–2.4)

## 2022-11-03 LAB — CBC
HCT: 40.5 % (ref 36.0–46.0)
Hemoglobin: 13.2 g/dL (ref 12.0–15.0)
MCH: 26.9 pg (ref 26.0–34.0)
MCHC: 32.6 g/dL (ref 30.0–36.0)
MCV: 82.7 fL (ref 80.0–100.0)
Platelets: 226 10*3/uL (ref 150–400)
RBC: 4.9 MIL/uL (ref 3.87–5.11)
RDW: 13.7 % (ref 11.5–15.5)
WBC: 5.5 10*3/uL (ref 4.0–10.5)
nRBC: 0 % (ref 0.0–0.2)

## 2022-11-03 MED ORDER — DIPHENOXYLATE-ATROPINE 2.5-0.025 MG PO TABS
1.0000 | ORAL_TABLET | Freq: Four times a day (QID) | ORAL | Status: DC
  Administered 2022-11-03 – 2022-11-09 (×26): 1 via ORAL
  Filled 2022-11-03 (×27): qty 1

## 2022-11-03 MED ORDER — ONDANSETRON HCL 4 MG/2ML IJ SOLN
4.0000 mg | Freq: Four times a day (QID) | INTRAMUSCULAR | Status: DC
  Administered 2022-11-03 – 2022-11-15 (×44): 4 mg via INTRAVENOUS
  Filled 2022-11-03 (×46): qty 2

## 2022-11-03 MED ORDER — SODIUM CHLORIDE 0.9 % IV SOLN: INTRAVENOUS | Status: DC

## 2022-11-03 MED ORDER — MAGNESIUM SULFATE 2 GM/50ML IV SOLN
2.0000 g | Freq: Once | INTRAVENOUS | Status: AC
  Administered 2022-11-03: 2 g via INTRAVENOUS
  Filled 2022-11-03: qty 50

## 2022-11-03 NOTE — Progress Notes (Signed)
Report called and given to [place], [place] on Dept. 962. Pt to be transferred to room 311.

## 2022-11-03 NOTE — Progress Notes (Signed)
PROGRESS NOTE    Renee Maldonado  OVZ:[id number] DOB: December 28, 1945 DOA: 11/01/2022 PCP: Claretta Fraise, MD   Brief Narrative:    Renee Maldonado is a 76 y.o. female with medical history significant for hypertension, dyslipidemia, anxiety disorder, COPD, GERD, and prior thyroid cancer with resection who presented to the ED with ongoing profuse, watery diarrhea that has been ongoing for the last 1 week.  She was admitted for evaluation of diarrhea with associated severe hypokalemia as well as some AKI related to this.   Assessment & Plan:   Principal Problem:   Hypokalemia Active Problems:   Hyperlipidemia   Hypertension   GAD (generalized anxiety disorder)   GERD (gastroesophageal reflux disease)   COPD (chronic obstructive pulmonary disease) (HCC)   Primary osteoarthritis of right knee   Papillary thyroid carcinoma (HCC)  Assessment and Plan:  Severe hypokalemia secondary to adenovirus diarrhea-improving -Replete aggressively and monitor on telemetry -Awaiting GI pathogen panel, C. difficile negative -Start clear liquid diet for now and advance diet as diarrhea improves -Imodium as needed -Schedule Lomotil and Zofran -Okay to transfer to telemetry since potassium improved   AKI on CKD stage IIIa -Appears prerenal in the setting of ongoing diarrhea -Aggressive IV fluid ongoing -Avoid nephrotoxic agents -Monitor repeat labs   Mild hyponatremia-resolved -Likely related to dehydration, monitor on normal saline infusion -Also uses HCTZ  Hypomagnesemia -Replete and reevaluate in a.m.   Dyslipidemia -Continue home medications   Hypertension -Hold home HCTZ and continue other medications   GERD -Continue on PPI   History of COPD with prior tobacco abuse -Continue Breo -Rescue inhalers as needed   Obesity -BMI 35.45         DVT prophylaxis: Heparin Code Status: Full Family Communication: Daughter at bedside 12/16 Disposition Plan:  Status is: Inpatient Remains  inpatient appropriate because: Need for IV medications.   Consultants:  None   Procedures:  None   Antimicrobials:  None    Subjective: Patient seen and evaluated today with ongoing nausea and vomiting as well as watery diarrhea with need for rectal tube.  She continues to have crampy abdominal pain.  Objective: Vitals:   11/03/22 0700 11/03/22 0730 11/03/22 0800 11/03/22 0854  BP: (!) 143/55  (!) 137/56   Pulse: 89 95 96 94  Resp: (!) 21 (!) 21 18 (!) 24  Temp:      TempSrc:      SpO2: (!) 88% 91% (!) 88% 93%  Weight:      Height:        Intake/Output Summary (Last 24 hours) at 11/03/2022 0925 Last data filed at 11/03/2022 0829 Gross per 24 hour  Intake 2999.74 ml  Output 2500 ml  Net 499.74 ml   Filed Weights   11/01/22 0215 11/02/22 0534  Weight: 85.1 kg 74.7 kg    Examination:  General exam: Appears calm and comfortable  Respiratory system: Clear to auscultation. Respiratory effort normal. Cardiovascular system: S1 & S2 heard, RRR.  Gastrointestinal system: Abdomen is soft Central nervous system: Alert and awake Extremities: No edema Skin: No significant lesions noted Psychiatry: Flat affect.    Data Reviewed: I have personally reviewed following labs and imaging studies  CBC: Recent Labs  Lab 11/01/22 0252 11/02/22 0526 11/03/22 0421  WBC 4.4 2.6* 5.5  NEUTROABS 3.3  --   --   HGB 13.8 12.9 13.2  HCT 40.9 38.1 40.5  MCV 79.6* 80.5 82.7  PLT 289 259 412   Basic Metabolic Panel: Recent Labs  Lab 11/01/22  1610 11/01/22 1541 11/02/22 0526 11/03/22 0421  NA 132*  --  140 138  K 2.1* 3.4* 3.2* 4.1  CL 96*  --  112* 112*  CO2 25  --  19* 18*  GLUCOSE 139*  --  138* 143*  BUN 32*  --  25* 30*  CREATININE 2.14*  --  1.75* 1.97*  CALCIUM 8.8*  --  8.3* 8.5*  MG  --   --  1.7 1.6*   GFR: Estimated Creatinine Clearance: 22.1 mL/min (A) (by C-G formula based on SCr of 1.97 mg/dL (H)). Liver Function Tests: Recent Labs  Lab  11/01/22 0252 11/02/22 0526  AST 27 16  ALT 21 16  ALKPHOS 69 61  BILITOT 1.1 0.7  PROT 7.8 6.7  ALBUMIN 4.5 3.8   Recent Labs  Lab 11/01/22 0252  LIPASE 26   No results for input(s): "AMMONIA" in the last 168 hours. Coagulation Profile: No results for input(s): "INR", "PROTIME" in the last 168 hours. Cardiac Enzymes: No results for input(s): "CKTOTAL", "CKMB", "CKMBINDEX", "TROPONINI" in the last 168 hours. BNP (last 3 results) No results for input(s): "PROBNP" in the last 8760 hours. HbA1C: No results for input(s): "HGBA1C" in the last 72 hours. CBG: Recent Labs  Lab 11/01/22 0214  GLUCAP 160*   Lipid Profile: No results for input(s): "CHOL", "HDL", "LDLCALC", "TRIG", "CHOLHDL", "LDLDIRECT" in the last 72 hours. Thyroid Function Tests: No results for input(s): "TSH", "T4TOTAL", "FREET4", "T3FREE", "THYROIDAB" in the last 72 hours. Anemia Panel: No results for input(s): "VITAMINB12", "FOLATE", "FERRITIN", "TIBC", "IRON", "RETICCTPCT" in the last 72 hours. Sepsis Labs: No results for input(s): "PROCALCITON", "LATICACIDVEN" in the last 168 hours.  Recent Results (from the past 240 hour(s))  Resp panel by RT-PCR (RSV, Flu A&B, Covid) Anterior Nasal Swab     Status: None   Collection Time: 11/01/22  2:39 AM   Specimen: Anterior Nasal Swab  Result Value Ref Range Status   SARS Coronavirus 2 by RT PCR NEGATIVE NEGATIVE Final    Comment: (NOTE) SARS-CoV-2 target nucleic acids are NOT DETECTED.  The SARS-CoV-2 RNA is generally detectable in upper respiratory specimens during the acute phase of infection. The lowest concentration of SARS-CoV-2 viral copies this assay can detect is 138 copies/mL. A negative result does not preclude SARS-Cov-2 infection and should not be used as the sole basis for treatment or other patient management decisions. A negative result may occur with  improper specimen collection/handling, submission of specimen other than nasopharyngeal swab,  presence of viral mutation(s) within the areas targeted by this assay, and inadequate number of viral copies(<138 copies/mL). A negative result must be combined with clinical observations, patient history, and epidemiological information. The expected result is Negative.  Fact Sheet for Patients:  EntrepreneurPulse.com.au  Fact Sheet for Healthcare Providers:  IncredibleEmployment.be  This test is no t yet approved or cleared by the [place] FDA and  has been authorized for detection and/or diagnosis of SARS-CoV-2 by FDA under an Emergency Use Authorization (EUA). This EUA will remain  in effect (meaning this test can be used) for the duration of the COVID-19 declaration under Section 564(b)(1) of the Act, 21 U.S.C.section 360bbb-3(b)(1), unless the authorization is terminated  or revoked sooner.       Influenza A by PCR NEGATIVE NEGATIVE Final   Influenza B by PCR NEGATIVE NEGATIVE Final    Comment: (NOTE) The Xpert Xpress SARS-CoV-2/FLU/RSV plus assay is intended as an aid in the diagnosis of influenza from Nasopharyngeal swab specimens and  should not be used as a sole basis for treatment. Nasal washings and aspirates are unacceptable for Xpert Xpress SARS-CoV-2/FLU/RSV testing.  Fact Sheet for Patients: EntrepreneurPulse.com.au  Fact Sheet for Healthcare Providers: IncredibleEmployment.be  This test is not yet approved or cleared by the [place] FDA and has been authorized for detection and/or diagnosis of SARS-CoV-2 by FDA under an Emergency Use Authorization (EUA). This EUA will remain in effect (meaning this test can be used) for the duration of the COVID-19 declaration under Section 564(b)(1) of the Act, 21 U.S.C. section 360bbb-3(b)(1), unless the authorization is terminated or revoked.     Resp Syncytial Virus by PCR NEGATIVE NEGATIVE Final    Comment: (NOTE) Fact Sheet for  Patients: EntrepreneurPulse.com.au  Fact Sheet for Healthcare Providers: IncredibleEmployment.be  This test is not yet approved or cleared by the [place] FDA and has been authorized for detection and/or diagnosis of SARS-CoV-2 by FDA under an Emergency Use Authorization (EUA). This EUA will remain in effect (meaning this test can be used) for the duration of the COVID-19 declaration under Section 564(b)(1) of the Act, 21 U.S.C. section 360bbb-3(b)(1), unless the authorization is terminated or revoked.  Performed at [facility], [address], [place], [place] [address]   Gastrointestinal Panel by PCR , Stool     Status: Abnormal   Collection Time: 11/01/22  2:49 AM   Specimen: Urine, In & Out Cath; Stool  Result Value Ref Range Status   Campylobacter species NOT DETECTED NOT DETECTED Final   Plesimonas shigelloides NOT DETECTED NOT DETECTED Final   Salmonella species NOT DETECTED NOT DETECTED Final   Yersinia enterocolitica NOT DETECTED NOT DETECTED Final   Vibrio species NOT DETECTED NOT DETECTED Final   Vibrio cholerae NOT DETECTED NOT DETECTED Final   Enteroaggregative E coli (EAEC) NOT DETECTED NOT DETECTED Final   Enteropathogenic E coli (EPEC) NOT DETECTED NOT DETECTED Final   Enterotoxigenic E coli (ETEC) NOT DETECTED NOT DETECTED Final   Shiga like toxin producing E coli (STEC) NOT DETECTED NOT DETECTED Final   Shigella/Enteroinvasive E coli (EIEC) NOT DETECTED NOT DETECTED Final   Cryptosporidium NOT DETECTED NOT DETECTED Final   Cyclospora cayetanensis NOT DETECTED NOT DETECTED Final   Entamoeba histolytica NOT DETECTED NOT DETECTED Final   Giardia lamblia NOT DETECTED NOT DETECTED Final   Adenovirus F40/41 DETECTED (A) NOT DETECTED Final   Astrovirus NOT DETECTED NOT DETECTED Final   Norovirus GI/GII NOT DETECTED NOT DETECTED Final   Rotavirus A NOT DETECTED NOT DETECTED Final   Sapovirus (I, II, IV, and V) NOT DETECTED NOT  DETECTED Final    Comment: Performed at [facility], [address] [place], [place] [address]  C Difficile Quick Screen w PCR reflex     Status: None   Collection Time: 11/01/22  2:49 AM   Specimen: Urine, In & Out Cath; Stool  Result Value Ref Range Status   C Diff antigen NEGATIVE NEGATIVE Final   C Diff toxin NEGATIVE NEGATIVE Final   C Diff interpretation No C. difficile detected.  Final    Comment: Performed at [facility], [address] [place], [place] [address]  MRSA Next Gen by PCR, Nasal     Status: None   Collection Time: 11/01/22  3:34 PM   Specimen: Nasal Mucosa; Nasal Swab  Result Value Ref Range Status   MRSA by PCR Next Gen NOT DETECTED NOT DETECTED Final    Comment: (NOTE) The GeneXpert MRSA Assay (FDA approved for NASAL specimens  only), is one component of a comprehensive MRSA colonization surveillance program. It is not intended to diagnose MRSA infection nor to guide or monitor treatment for MRSA infections. Test performance is not FDA approved in patients less than 31 years old. Performed at [facility], [address], [place], [place] [address]          Radiology Studies: No results found.      Scheduled Meds:  amLODipine  5 mg Oral Daily   anastrozole  1 mg Oral q AM   Chlorhexidine Gluconate Cloth  6 each Topical Daily   diphenoxylate-atropine  1 tablet Oral QID   famotidine  10 mg Oral Daily   fluticasone furoate-vilanterol  1 puff Inhalation Once per day on Sun Tue Thu Sat   heparin  5,000 Units Subcutaneous Q8H   melatonin  9 mg Oral QHS   ondansetron (ZOFRAN) IV  4 mg Intravenous Q6H   raloxifene  60 mg Oral Daily   rosuvastatin  20 mg Oral Daily   traZODone  100 mg Oral QHS   Continuous Infusions:  sodium chloride 100 mL/hr at 11/03/22 0827   magnesium sulfate bolus IVPB       LOS: 2 days    Time spent: 35 minutes    Namira Rosekrans Darleen Crocker, DO [facility]  If 7PM-7AM, please contact  night-coverage [email or web address] 11/03/2022, 9:25 AM

## 2022-11-03 NOTE — Care Management Important Message (Signed)
Important Message  Patient Details  Name: Renee Maldonado MRN: [id number] Date of Birth: 07-03-1946   Medicare Important Message Given:  N/A - LOS <3 / Initial given by admissions     Tommy Medal 11/03/2022, 2:38 PM

## 2022-11-04 DIAGNOSIS — E876 Hypokalemia: Secondary | ICD-10-CM | POA: Diagnosis not present

## 2022-11-04 LAB — BASIC METABOLIC PANEL
Anion gap: 8 (ref 5–15)
BUN: 31 mg/dL — ABNORMAL HIGH (ref 8–23)
CO2: 13 mmol/L — ABNORMAL LOW (ref 22–32)
Calcium: 8.2 mg/dL — ABNORMAL LOW (ref 8.9–10.3)
Chloride: 115 mmol/L — ABNORMAL HIGH (ref 98–111)
Creatinine, Ser: 2.26 mg/dL — ABNORMAL HIGH (ref 0.44–1.00)
GFR, Estimated: 22 mL/min — ABNORMAL LOW (ref >60)
Glucose, Bld: 122 mg/dL — ABNORMAL HIGH (ref 70–99)
Potassium: 3.5 mmol/L (ref 3.5–5.1)
Sodium: 136 mmol/L (ref 135–145)

## 2022-11-04 LAB — CBC
HCT: 35.1 % — ABNORMAL LOW (ref 36.0–46.0)
Hemoglobin: 11.7 g/dL — ABNORMAL LOW (ref 12.0–15.0)
MCH: 27.1 pg (ref 26.0–34.0)
MCHC: 33.3 g/dL (ref 30.0–36.0)
MCV: 81.4 fL (ref 80.0–100.0)
Platelets: 250 10*3/uL (ref 150–400)
RBC: 4.31 MIL/uL (ref 3.87–5.11)
RDW: 14.2 % (ref 11.5–15.5)
WBC: 8.1 10*3/uL (ref 4.0–10.5)
nRBC: 0 % (ref 0.0–0.2)

## 2022-11-04 LAB — MAGNESIUM: Magnesium: 2.3 mg/dL (ref 1.7–2.4)

## 2022-11-04 MED ORDER — POTASSIUM CHLORIDE 10 MEQ/100ML IV SOLN
10.0000 meq | INTRAVENOUS | Status: AC
  Administered 2022-11-04 (×4): 10 meq via INTRAVENOUS
  Filled 2022-11-04 (×3): qty 100

## 2022-11-04 MED ORDER — SACCHAROMYCES BOULARDII 250 MG PO CAPS
250.0000 mg | ORAL_CAPSULE | Freq: Two times a day (BID) | ORAL | Status: DC
  Administered 2022-11-04 – 2022-11-15 (×23): 250 mg via ORAL
  Filled 2022-11-04 (×23): qty 1

## 2022-11-04 NOTE — Progress Notes (Addendum)
PROGRESS NOTE    Renee Maldonado  TIW:[id number] DOB: 08-07-1946 DOA: 11/01/2022 PCP: Claretta Fraise, MD   Brief Narrative:    Renee Maldonado is a 76 y.o. female with medical history significant for hypertension, dyslipidemia, anxiety disorder, COPD, GERD, and prior thyroid cancer with resection who presented to the ED with ongoing profuse, watery diarrhea that has been ongoing for the last 1 week.  She was admitted for evaluation of diarrhea with associated severe hypokalemia as well as some AKI related to this.  She is noted to have adenovirus gastroenteritis.  She continues to have ongoing significant diarrhea with nausea and vomiting that requires aggressive IV fluid repletion.  Zofran and Lomotil scheduled.  Assessment & Plan:   Principal Problem:   Hypokalemia Active Problems:   Hyperlipidemia   Hypertension   GAD (generalized anxiety disorder)   GERD (gastroesophageal reflux disease)   COPD (chronic obstructive pulmonary disease) (HCC)   Primary osteoarthritis of right knee   Papillary thyroid carcinoma (HCC)  Assessment and Plan:   Severe hypokalemia secondary to adenovirus diarrhea-hypokalemia resolved, diarrhea ongoing -Continue aggressive IV fluid repletion -C. difficile negative -Start clear liquid diet for now and advance diet as diarrhea improves -Imodium as needed -Scheduled Lomotil and Zofran 12/19, discussed case with GI with recommendations to continue the same and add probiotics -Okay to transfer to telemetry since potassium improved   AKI on CKD stage IIIa -Appears prerenal in the setting of ongoing diarrhea -Aggressive IV fluid ongoing, increase fluid rate to 150 cc/h -Avoid nephrotoxic agents -Monitor repeat labs   Mild hyponatremia-resolved -Likely related to dehydration, monitor on normal saline infusion -Also uses HCTZ which has been held at this time   Dyslipidemia -Continue home medications   Hypertension -Hold home HCTZ and continue other  medications   GERD -Continue on PPI   History of COPD with prior tobacco abuse -Continue Breo -Rescue inhalers as needed   Obesity -BMI 35.45      DVT prophylaxis: Heparin Code Status: Full Family Communication: Daughter at bedside 12/16 Disposition Plan:  Status is: Inpatient Remains inpatient appropriate because: Need for IV medications.   Consultants:  None   Procedures:  None   Antimicrobials:  None    Subjective: Patient seen and evaluated today with ongoing nausea and vomiting that is slowly improving.  She continues to have profuse watery diarrhea.  Objective: Vitals:   11/03/22 1551 11/03/22 2111 11/04/22 0454 11/04/22 0726  BP: 110/63 120/65 (!) 118/58   Pulse: 92 96 90   Resp:  18 16   Temp:  98.7 F (37.1 C) 98.9 F (37.2 C)   TempSrc:   Oral   SpO2: 96% 95% 96% 97%  Weight:      Height:        Intake/Output Summary (Last 24 hours) at 11/04/2022 1034 Last data filed at 11/04/2022 0900 Gross per 24 hour  Intake 1080 ml  Output 3200 ml  Net -2120 ml   Filed Weights   11/01/22 0215 11/02/22 0534  Weight: 85.1 kg 74.7 kg    Examination:  General exam: Appears calm and comfortable  Respiratory system: Clear to auscultation. Respiratory effort normal. Cardiovascular system: S1 & S2 heard, RRR.  Gastrointestinal system: Abdomen is soft Central nervous system: Alert and awake Extremities: No edema Skin: No significant lesions noted Psychiatry: Flat affect.    Data Reviewed: I have personally reviewed following labs and imaging studies  CBC: Recent Labs  Lab 11/01/22 0252 11/02/22 0526 11/03/22 0421 11/04/22 0333  WBC  4.4 2.6* 5.5 8.1  NEUTROABS 3.3  --   --   --   HGB 13.8 12.9 13.2 11.7*  HCT 40.9 38.1 40.5 35.1*  MCV 79.6* 80.5 82.7 81.4  PLT 289 259 226 761   Basic Metabolic Panel: Recent Labs  Lab 11/01/22 0252 11/01/22 1541 11/02/22 0526 11/03/22 0421 11/04/22 0333  NA 132*  --  140 138 136  K 2.1* 3.4* 3.2* 4.1  3.5  CL 96*  --  112* 112* 115*  CO2 25  --  19* 18* 13*  GLUCOSE 139*  --  138* 143* 122*  BUN 32*  --  25* 30* 31*  CREATININE 2.14*  --  1.75* 1.97* 2.26*  CALCIUM 8.8*  --  8.3* 8.5* 8.2*  MG  --   --  1.7 1.6* 2.3   GFR: Estimated Creatinine Clearance: 19.3 mL/min (A) (by C-G formula based on SCr of 2.26 mg/dL (H)). Liver Function Tests: Recent Labs  Lab 11/01/22 0252 11/02/22 0526  AST 27 16  ALT 21 16  ALKPHOS 69 61  BILITOT 1.1 0.7  PROT 7.8 6.7  ALBUMIN 4.5 3.8   Recent Labs  Lab 11/01/22 0252  LIPASE 26   No results for input(s): "AMMONIA" in the last 168 hours. Coagulation Profile: No results for input(s): "INR", "PROTIME" in the last 168 hours. Cardiac Enzymes: No results for input(s): "CKTOTAL", "CKMB", "CKMBINDEX", "TROPONINI" in the last 168 hours. BNP (last 3 results) No results for input(s): "PROBNP" in the last 8760 hours. HbA1C: No results for input(s): "HGBA1C" in the last 72 hours. CBG: Recent Labs  Lab 11/01/22 0214  GLUCAP 160*   Lipid Profile: No results for input(s): "CHOL", "HDL", "LDLCALC", "TRIG", "CHOLHDL", "LDLDIRECT" in the last 72 hours. Thyroid Function Tests: No results for input(s): "TSH", "T4TOTAL", "FREET4", "T3FREE", "THYROIDAB" in the last 72 hours. Anemia Panel: No results for input(s): "VITAMINB12", "FOLATE", "FERRITIN", "TIBC", "IRON", "RETICCTPCT" in the last 72 hours. Sepsis Labs: No results for input(s): "PROCALCITON", "LATICACIDVEN" in the last 168 hours.  Recent Results (from the past 240 hour(s))  Resp panel by RT-PCR (RSV, Flu A&B, Covid) Anterior Nasal Swab     Status: None   Collection Time: 11/01/22  2:39 AM   Specimen: Anterior Nasal Swab  Result Value Ref Range Status   SARS Coronavirus 2 by RT PCR NEGATIVE NEGATIVE Final    Comment: (NOTE) SARS-CoV-2 target nucleic acids are NOT DETECTED.  The SARS-CoV-2 RNA is generally detectable in upper respiratory specimens during the acute phase of infection.  The lowest concentration of SARS-CoV-2 viral copies this assay can detect is 138 copies/mL. A negative result does not preclude SARS-Cov-2 infection and should not be used as the sole basis for treatment or other patient management decisions. A negative result may occur with  improper specimen collection/handling, submission of specimen other than nasopharyngeal swab, presence of viral mutation(s) within the areas targeted by this assay, and inadequate number of viral copies(<138 copies/mL). A negative result must be combined with clinical observations, patient history, and epidemiological information. The expected result is Negative.  Fact Sheet for Patients:  EntrepreneurPulse.com.au  Fact Sheet for Healthcare Providers:  IncredibleEmployment.be  This test is no t yet approved or cleared by the [place] FDA and  has been authorized for detection and/or diagnosis of SARS-CoV-2 by FDA under an Emergency Use Authorization (EUA). This EUA will remain  in effect (meaning this test can be used) for the duration of the COVID-19 declaration under Section 564(b)(1) of the  Act, 21 U.S.C.section 360bbb-3(b)(1), unless the authorization is terminated  or revoked sooner.       Influenza A by PCR NEGATIVE NEGATIVE Final   Influenza B by PCR NEGATIVE NEGATIVE Final    Comment: (NOTE) The Xpert Xpress SARS-CoV-2/FLU/RSV plus assay is intended as an aid in the diagnosis of influenza from Nasopharyngeal swab specimens and should not be used as a sole basis for treatment. Nasal washings and aspirates are unacceptable for Xpert Xpress SARS-CoV-2/FLU/RSV testing.  Fact Sheet for Patients: EntrepreneurPulse.com.au  Fact Sheet for Healthcare Providers: IncredibleEmployment.be  This test is not yet approved or cleared by the [place] FDA and has been authorized for detection and/or diagnosis of SARS-CoV-2 by FDA  under an Emergency Use Authorization (EUA). This EUA will remain in effect (meaning this test can be used) for the duration of the COVID-19 declaration under Section 564(b)(1) of the Act, 21 U.S.C. section 360bbb-3(b)(1), unless the authorization is terminated or revoked.     Resp Syncytial Virus by PCR NEGATIVE NEGATIVE Final    Comment: (NOTE) Fact Sheet for Patients: EntrepreneurPulse.com.au  Fact Sheet for Healthcare Providers: IncredibleEmployment.be  This test is not yet approved or cleared by the [place] FDA and has been authorized for detection and/or diagnosis of SARS-CoV-2 by FDA under an Emergency Use Authorization (EUA). This EUA will remain in effect (meaning this test can be used) for the duration of the COVID-19 declaration under Section 564(b)(1) of the Act, 21 U.S.C. section 360bbb-3(b)(1), unless the authorization is terminated or revoked.  Performed at [facility], [address], [place], [place] [address]   Gastrointestinal Panel by PCR , Stool     Status: Abnormal   Collection Time: 11/01/22  2:49 AM   Specimen: Urine, In & Out Cath; Stool  Result Value Ref Range Status   Campylobacter species NOT DETECTED NOT DETECTED Final   Plesimonas shigelloides NOT DETECTED NOT DETECTED Final   Salmonella species NOT DETECTED NOT DETECTED Final   Yersinia enterocolitica NOT DETECTED NOT DETECTED Final   Vibrio species NOT DETECTED NOT DETECTED Final   Vibrio cholerae NOT DETECTED NOT DETECTED Final   Enteroaggregative E coli (EAEC) NOT DETECTED NOT DETECTED Final   Enteropathogenic E coli (EPEC) NOT DETECTED NOT DETECTED Final   Enterotoxigenic E coli (ETEC) NOT DETECTED NOT DETECTED Final   Shiga like toxin producing E coli (STEC) NOT DETECTED NOT DETECTED Final   Shigella/Enteroinvasive E coli (EIEC) NOT DETECTED NOT DETECTED Final   Cryptosporidium NOT DETECTED NOT DETECTED Final   Cyclospora cayetanensis NOT DETECTED NOT  DETECTED Final   Entamoeba histolytica NOT DETECTED NOT DETECTED Final   Giardia lamblia NOT DETECTED NOT DETECTED Final   Adenovirus F40/41 DETECTED (A) NOT DETECTED Final   Astrovirus NOT DETECTED NOT DETECTED Final   Norovirus GI/GII NOT DETECTED NOT DETECTED Final   Rotavirus A NOT DETECTED NOT DETECTED Final   Sapovirus (I, II, IV, and V) NOT DETECTED NOT DETECTED Final    Comment: Performed at [facility], [address] [place], [place] [address]  C Difficile Quick Screen w PCR reflex     Status: None   Collection Time: 11/01/22  2:49 AM   Specimen: Urine, In & Out Cath; Stool  Result Value Ref Range Status   C Diff antigen NEGATIVE NEGATIVE Final   C Diff toxin NEGATIVE NEGATIVE Final   C Diff interpretation No C. difficile detected.  Final    Comment: Performed at [facility], [address], [place], [place]  [address]  MRSA Next Gen by PCR, Nasal     Status: None   Collection Time: 11/01/22  3:34 PM   Specimen: Nasal Mucosa; Nasal Swab  Result Value Ref Range Status   MRSA by PCR Next Gen NOT DETECTED NOT DETECTED Final    Comment: (NOTE) The GeneXpert MRSA Assay (FDA approved for NASAL specimens only), is one component of a comprehensive MRSA colonization surveillance program. It is not intended to diagnose MRSA infection nor to guide or monitor treatment for MRSA infections. Test performance is not FDA approved in patients less than 34 years old. Performed at [facility], [address], [place], [place] [address]          Radiology Studies: No results found.      Scheduled Meds:  amLODipine  5 mg Oral Daily   anastrozole  1 mg Oral q AM   Chlorhexidine Gluconate Cloth  6 each Topical Daily   diphenoxylate-atropine  1 tablet Oral QID   famotidine  10 mg Oral Daily   fluticasone furoate-vilanterol  1 puff Inhalation Once per day on Sun Tue Thu Sat   heparin  5,000 Units Subcutaneous Q8H   melatonin  9 mg Oral QHS   ondansetron (ZOFRAN) IV  4  mg Intravenous Q6H   raloxifene  60 mg Oral Daily   rosuvastatin  20 mg Oral Daily   traZODone  100 mg Oral QHS   Continuous Infusions:  sodium chloride 150 mL/hr at 11/04/22 0851     LOS: 3 days    Time spent: 35 minutes    Renee Maldonado Darleen Crocker, DO [facility]  If 7PM-7AM, please contact night-coverage [email or web address] 11/04/2022, 10:34 AM

## 2022-11-04 NOTE — Progress Notes (Signed)
Pt is alert and oriented x4. Pt still has a rectal tube due to liquid stool. Pt has had several episodes of emesis this shift. Pt has not been able to tolerate lunch or dinner. Pt has been ambulated back and forth in room and was able to sit in the chair most of the day. Pt denies any pain at this time.

## 2022-11-05 DIAGNOSIS — E876 Hypokalemia: Secondary | ICD-10-CM | POA: Diagnosis not present

## 2022-11-05 DIAGNOSIS — K529 Noninfective gastroenteritis and colitis, unspecified: Secondary | ICD-10-CM

## 2022-11-05 DIAGNOSIS — R197 Diarrhea, unspecified: Secondary | ICD-10-CM

## 2022-11-05 DIAGNOSIS — N179 Acute kidney failure, unspecified: Secondary | ICD-10-CM

## 2022-11-05 DIAGNOSIS — F411 Generalized anxiety disorder: Secondary | ICD-10-CM

## 2022-11-05 LAB — BASIC METABOLIC PANEL
Anion gap: 8 (ref 5–15)
BUN: 33 mg/dL — ABNORMAL HIGH (ref 8–23)
CO2: 13 mmol/L — ABNORMAL LOW (ref 22–32)
Calcium: 8.3 mg/dL — ABNORMAL LOW (ref 8.9–10.3)
Chloride: 115 mmol/L — ABNORMAL HIGH (ref 98–111)
Creatinine, Ser: 2.71 mg/dL — ABNORMAL HIGH (ref 0.44–1.00)
GFR, Estimated: 18 mL/min — ABNORMAL LOW (ref >60)
Glucose, Bld: 130 mg/dL — ABNORMAL HIGH (ref 70–99)
Potassium: 3.6 mmol/L (ref 3.5–5.1)
Sodium: 136 mmol/L (ref 135–145)

## 2022-11-05 LAB — CBC
HCT: 36.3 % (ref 36.0–46.0)
Hemoglobin: 12.2 g/dL (ref 12.0–15.0)
MCH: 27.1 pg (ref 26.0–34.0)
MCHC: 33.6 g/dL (ref 30.0–36.0)
MCV: 80.7 fL (ref 80.0–100.0)
Platelets: 255 10*3/uL (ref 150–400)
RBC: 4.5 MIL/uL (ref 3.87–5.11)
RDW: 14.2 % (ref 11.5–15.5)
WBC: 13.3 10*3/uL — ABNORMAL HIGH (ref 4.0–10.5)
nRBC: 0 % (ref 0.0–0.2)

## 2022-11-05 LAB — MAGNESIUM: Magnesium: 2.1 mg/dL (ref 1.7–2.4)

## 2022-11-05 MED ORDER — CHOLESTYRAMINE 4 G PO PACK
4.0000 g | PACK | Freq: Two times a day (BID) | ORAL | Status: DC
  Administered 2022-11-05 – 2022-11-14 (×18): 4 g via ORAL
  Filled 2022-11-05 (×25): qty 1

## 2022-11-05 NOTE — Consult Note (Signed)
Gastroenterology Consult   Referring Provider: No ref. provider found Primary Care Physician:  Claretta Fraise, MD Primary Gastroenterologist:  not established   Patient ID: Renee Maldonado; [id number]; [birth year]   Admit date: 11/01/2022  LOS: 4 days   Date of Consultation: 11/05/2022  Reason for Consultation:  infectious diarrhea  History of Present Illness   Renee Maldonado is a 76 y.o. year old female with pmh of anxiety, arthritis, breast cancer, COPD, GERD, HLD, HTN, OA, thyroid cancer who presented to the ED on 12/16 for profuse watery diarrhea x1 week.   CT A/P wo contrast on 12/16  C diff testing negative, GI pathogen panel positive for adenovirus Fluid in the small bowel and colon consistent with ileus/enteritis. No bowel obstruction or inflammatory change. Diverticulosis without evidence of diverticulitis. Hepatic cirrhosis with mild steatosis. No ascites, portal vein dilatation or splenomegaly. Aortic atherosclerosis. Stable 1 cm right lower lobe ground-glass nodule. Small hiatal hernia.  Small inguinal fat hernias. Osteopenia and degenerative change.  WBC 13.3, potassium 2.1 on admission has been corrected   Consult:  Patient states that she has had diarrhea for almost 2 weeks now. Diarrhea began suddenly and she suspected it was a normal stomach virus that would resolve within a few days. She was trying to stay well hydrated at home but began feeling so bad over the weekend she decided to call 911 to bring her to the ED. She notes that diarrhea was constant and became more watery as time went on. Denies overt blood in stools or black stools. She has intermittent abdominal cramping. She states liquid diet makes her feel nauseous. Notes prior to acute illness, she had regular BMs, that were more solid. She was taking pepto bismol at home to try and help once she started feeling sick. Denies fevers or chills. No recent sick contacts that she is aware of.    Last Colonoscopy: April  2022, [facility], reports not a good evaluation per patient but recommended to have no repeat due to her age.   Past Medical History:  Diagnosis Date   Anxiety    Arthritis    Cancer ([place])    breast Right no chemo or radiation  on Arimidex   COPD (chronic obstructive pulmonary disease) ([place])    on Breo   GERD (gastroesophageal reflux disease)    Humerus fracture 2007   Hyperlipidemia    Hypertension    Insomnia    OA (osteoarthritis) of knee 10/08/2020   Thyroid cancer ([place]) 2023    Past Surgical History:  Procedure Laterality Date   BREAST LUMPECTOMY Right 02/2019   BREAST SURGERY Right 01/2019   breast biopsy   BRONCHIAL BIOPSY  04/15/2022   Procedure: BRONCHIAL BIOPSIES;  Surgeon: Garner Nash, DO;  Location: [facility];  Service: Pulmonary;;   BRONCHIAL BRUSHINGS  04/15/2022   Procedure: BRONCHIAL BRUSHINGS;  Surgeon: Garner Nash, DO;  Location: [place];  Service: Pulmonary;;   BRONCHIAL NEEDLE ASPIRATION BIOPSY  04/15/2022   Procedure: BRONCHIAL NEEDLE ASPIRATION BIOPSIES;  Surgeon: Garner Nash, DO;  Location: [facility];  Service: Pulmonary;;   COLONOSCOPY  2022   FIDUCIAL MARKER PLACEMENT  04/15/2022   Procedure: FIDUCIAL MARKER PLACEMENT;  Surgeon: Garner Nash, DO;  Location: [facility];  Service: Pulmonary;;   JOINT REPLACEMENT     left knee   Left shoulder surgery     2007   RADICAL NECK DISSECTION Right 10/10/2022   Procedure: CENTRAL NECK DISSECTION;  Surgeon: Jenetta Downer, MD;  Location: [facility];  Service: ENT;  Laterality: Right;   THYROIDECTOMY Right 10/10/2022   Procedure: RIGHT THYROID LOBECTOMY;  Surgeon: Jenetta Downer, MD;  Location: [facility];  Service: ENT;  Laterality: Right;   TOTAL KNEE ARTHROPLASTY     2012 left   TOTAL KNEE ARTHROPLASTY Right 10/08/2020   Procedure: TOTAL KNEE ARTHROPLASTY;  Surgeon: Gaynelle Arabian, MD;  Location: [facility];  Service: Orthopedics;  Laterality: Right;   TUBAL LIGATION  1978   VIDEO  BRONCHOSCOPY WITH RADIAL ENDOBRONCHIAL ULTRASOUND  04/15/2022   Procedure: VIDEO BRONCHOSCOPY WITH RADIAL ENDOBRONCHIAL ULTRASOUND;  Surgeon: Garner Nash, DO;  Location: [facility];  Service: Pulmonary;;    Prior to Admission medications   Medication Sig Start Date End Date Taking? Authorizing Provider  albuterol (VENTOLIN HFA) 108 (90 Base) MCG/ACT inhaler 2 puffs in lungs every 6 hours as needed for wheezing/short of breath. 12/13/20  Yes Stacks, Cletus Gash, MD  amLODipine (NORVASC) 5 MG tablet Take 1 tablet (5 mg total) by mouth daily. 07/14/22  Yes Stacks, Cletus Gash, MD  anastrozole (ARIMIDEX) 1 MG tablet Take 1 mg by mouth in the morning. 07/22/19  Yes [provider]  aspirin 500 MG tablet Take 500 mg by mouth every 6 (six) hours as needed (pain.).   Yes [provider]  Calcium Carb-Cholecalciferol 600-20 MG-MCG TABS Take 1 tablet by mouth in the morning.   Yes [provider]  chlorpheniramine (CHLOR-TRIMETON) 4 MG tablet Take 4 mg by mouth See admin instructions. Take 1 tablet (4 mg) by mouth scheduled every night & may take an additional dose in the morning if needed for allergies.   Yes [provider]  cholecalciferol (VITAMIN D3) 25 MCG (1000 UNIT) tablet Take 1,000 Units by mouth in the morning.   Yes [provider]  cyanocobalamin (VITAMIN B12) 1000 MCG/ML injection Inject 1 mL (1,000 mcg total) into the skin every 30 (thirty) days. 07/14/22  Yes Claretta Fraise, MD  famotidine (PEPCID) 40 MG tablet Take 1 tablet (40 mg total) by mouth daily. 07/14/22  Yes Stacks, Cletus Gash, MD  fluticasone furoate-vilanterol (BREO ELLIPTA) 200-25 MCG/ACT AEPB Inhale 1 puff into the lungs daily. Patient taking differently: Inhale 1 puff into the lungs 4 (four) times a week. 07/14/22  Yes Stacks, Cletus Gash, MD  hydrochlorothiazide (HYDRODIURIL) 25 MG tablet Take 1 tablet (25 mg total) by mouth daily. 07/14/22  Yes Claretta Fraise, MD  hydrOXYzine (ATARAX/VISTARIL) 25 MG  tablet Take 25 mg by mouth every 4 (four) hours as needed for itching.   Yes [provider]  L-Lysine 500 MG TABS Take 500 mg by mouth every evening.   Yes [provider]  Melatonin 10 MG CAPS Take 10 mg by mouth at bedtime.   Yes [provider]  Polyethyl Glycol-Propyl Glycol (LUBRICANT EYE DROPS) 0.4-0.3 % SOLN Place 1-2 drops into both eyes 3 (three) times daily as needed (dry/irritated eyes.).   Yes [provider]  raloxifene (EVISTA) 60 MG tablet Take 1 tablet (60 mg total) by mouth daily. 07/14/22  Yes Stacks, Cletus Gash, MD  RESTASIS 0.05 % ophthalmic emulsion Place 1 drop into both eyes 2 (two) times daily as needed (dry eyes).  01/18/14  Yes [provider]  rosuvastatin (CRESTOR) 20 MG tablet TAKE 1 TABLET DAILY Patient taking differently: Take 20 mg by mouth daily. 10/28/22  Yes Stacks, Cletus Gash, MD  sodium chloride (OCEAN) 0.65 % SOLN nasal spray Place 1 spray into both nostrils as needed for congestion.   Yes [provider]  traZODone (DESYREL) 100 MG tablet TAKE 1 TABLET AT BEDTIME Patient taking differently: Take 100 mg by mouth at bedtime. 05/12/22  Yes Stacks, Cletus Gash, MD  valACYclovir (VALTREX) 500 MG tablet TAKE (1) TABLET DAILY AS NEEDED. Patient taking differently: Take 500 mg by mouth daily as needed (fever blisters.). 12/13/20  Yes Stacks, Cletus Gash, MD  hydrocortisone cream 1 % Apply 1 Application topically daily as needed (dry/itchy skin on back). Patient not taking: Reported on 11/01/2022    [provider]  ondansetron (ZOFRAN) 4 MG tablet Take 1 tablet (4 mg total) by mouth every 8 (eight) hours as needed for nausea or vomiting. Patient not taking: Reported on 11/01/2022 10/31/22   Chevis Pretty, FNP    Current Facility-Administered Medications  Medication Dose Route Frequency Provider Last Rate Last Admin   0.9 %  sodium chloride infusion   Intravenous Continuous Heath Lark D, DO 150 mL/hr at 11/05/22  4315 New Bag at 11/05/22 0647   acetaminophen (TYLENOL) tablet 650 mg  650 mg Oral Q6H PRN Heath Lark D, DO       Or   acetaminophen (TYLENOL) suppository 650 mg  650 mg Rectal Q6H PRN [name], [name], DO       albuterol (PROVENTIL) (2.5 MG/3ML) 0.083% nebulizer solution 2.5 mg  2.5 mg Nebulization Q4H PRN Adefeso, [name], DO       amLODipine (NORVASC) tablet 5 mg  5 mg Oral Daily [name], [name], DO   5 mg at 11/05/22 4008   anastrozole (ARIMIDEX) tablet 1 mg  1 mg Oral q AM [name], [name], DO   1 mg at 11/03/22 0825   Chlorhexidine Gluconate Cloth 2 % PADS 6 each  6 each Topical Daily Heath Lark D, DO   6 each at 11/04/22 1110   cycloSPORINE (RESTASIS) 0.05 % ophthalmic emulsion 1 drop  1 drop Both Eyes BID PRN [name], [name], DO       diphenoxylate-atropine (LOMOTIL) 2.5-0.025 MG per tablet 1 tablet  1 tablet Oral QID [name], [name], DO   1 tablet at 11/05/22 0925   famotidine (PEPCID) tablet 10 mg  10 mg Oral Daily Adefeso, [name], DO   10 mg at 11/05/22 0925   fluticasone furoate-vilanterol (BREO ELLIPTA) 200-25 MCG/ACT 1 puff  1 puff Inhalation Once per day on Sun Tue Thu Sat Heath Lark D, DO   1 puff at 11/04/22 6761   heparin injection 5,000 Units  5,000 Units Subcutaneous Q8H Heath Lark D, DO   5,000 Units at 11/05/22 9509   hydrOXYzine (ATARAX) tablet 25 mg  25 mg Oral Q4H PRN [name], [name], DO       loperamide (IMODIUM) capsule 2 mg  2 mg Oral PRN [name], [name], DO   2 mg at 11/05/22 0141   melatonin tablet 9 mg  9 mg Oral QHS Adefeso, [name], DO   9 mg at 11/04/22 2156   ondansetron (ZOFRAN) tablet 4 mg  4 mg Oral Q6H PRN [name], [name], DO       Or   ondansetron (ZOFRAN) injection 4 mg  4 mg Intravenous Q6H PRN [name], [name], DO   4 mg at 11/02/22 2200   ondansetron (ZOFRAN) injection 4 mg  4 mg Intravenous Q6H [name], [name], DO   4 mg at 11/05/22 0548   polyvinyl alcohol (LIQUIFILM TEARS) 1.4 % ophthalmic solution 1-2 drop  1-2 drop Both Eyes TID PRN Heath Lark D, DO  raloxifene (EVISTA) tablet 60 mg  60 mg Oral Daily [name], [name], DO   60 mg at 11/05/22 4627   rosuvastatin (CRESTOR) tablet 20 mg  20 mg Oral Daily [name], [name], DO   20 mg at 11/05/22 0350   saccharomyces boulardii (FLORASTOR) capsule 250 mg  250 mg Oral BID Heath Lark D, DO   250 mg at 11/05/22 0938   sodium chloride (OCEAN) 0.65 % nasal spray 1 spray  1 spray Each Nare PRN [name], [name], DO       traZODone (DESYREL) tablet 100 mg  100 mg Oral QHS [name], [name], DO   100 mg at 11/04/22 2156    Allergies as of 11/01/2022 - Review Complete 11/01/2022  Allergen Reaction Noted   Benadryl [diphenhydramine hcl] Nausea Only 06/29/2013   Penicillins  06/29/2013    Family History  Problem Relation Age of Onset   Alzheimer's disease Mother    Diabetes Mother    Stroke Father    Hypertension Father    Cancer Brother     Social History   Socioeconomic History   Marital status: Married    Spouse name: Jori Moll   Number of children: 3   Years of education: 16   Highest education level: Bachelor's degree (e.g., BA, AB, BS)  Occupational History   Occupation: retired  Tobacco Use   Smoking status: Former    Packs/day: 0.25    Years: 40.00    Total pack years: 10.00    Types: Cigarettes    Quit date: 09/17/2014    Years since quitting: 8.1   Smokeless tobacco: Never   Tobacco comments:    smokes 3 a day  Vaping Use   Vaping Use: Former   Start date: 07/18/2018   Quit date: 07/19/2019  Substance and Sexual Activity   Alcohol use: Yes    Alcohol/week: 4.0 standard drinks of alcohol    Types: 4 Standard drinks or equivalent per week   Drug use: No   Sexual activity: Not Currently  Other Topics Concern   Not on file  Social History Narrative   Lives home with her husband. Enjoys travelling, playing bingo, exercising at [facility] center, eats out a lot   Social Determinants of Health   Financial Resource Strain: Low Risk  (08/02/2021)   Overall Financial Resource  Strain (CARDIA)    Difficulty of Paying Living Expenses: Not hard at all  Food Insecurity: No Food Insecurity (11/01/2022)   Hunger Vital Sign    Worried About Running Out of Food in the Last Year: Never true    Ran Out of Food in the Last Year: Never true  Transportation Needs: No Transportation Needs (11/01/2022)   PRAPARE - Hydrologist (Medical): No    Lack of Transportation (Non-Medical): No  Physical Activity: Insufficiently Active (08/02/2021)   Exercise Vital Sign    Days of Exercise per Week: 4 days    Minutes of Exercise per Session: 30 min  Stress: No Stress Concern Present (08/02/2021)   [place]    Feeling of Stress : Not at all  Social Connections: [place] (08/02/2021)   Social Connection and Isolation Panel [NHANES]    Frequency of Communication with Friends and Family: More than three times a week    Frequency of Social Gatherings with Friends and Family: More than three times a week    Attends Religious Services: More than 4 times  per year    Active Member of Clubs or Organizations: Yes    Attends Archivist Meetings: More than 4 times per year    Marital Status: Married  Human resources officer Violence: Not At Risk (11/01/2022)   Humiliation, Afraid, Rape, and Kick questionnaire    Fear of Current or Ex-Partner: No    Emotionally Abused: No    Physically Abused: No    Sexually Abused: No     Review of Systems   Gen: Denies any fever, chills, loss of appetite, change in weight or weight loss CV: Denies chest pain, heart palpitations, syncope, edema  Resp: Denies shortness of breath with rest, cough, wheezing, coughing up blood, and pleurisy. GI: denies melena, hematochezia, constipation, dysphagia, odyonophagia, early satiety or weight loss. +nausea +vomiting +diarrhea GU : Denies urinary burning, blood in urine, urinary frequency, and urinary  incontinence. MS: Denies joint pain, limitation of movement, swelling, cramps, and atrophy.  Derm: Denies rash, itching, dry skin, hives. Psych: Denies depression, anxiety, memory loss, hallucinations, and confusion. Heme: Denies bruising or bleeding Neuro:  Denies any headaches, dizziness, paresthesias, shaking  Physical Exam   Vital Signs in last 24 hours: Temp:  [97 F (36.1 C)-98 F (36.7 C)] 97 F (36.1 C) (12/20 0456) Pulse Rate:  [89-98] 98 (12/20 0456) Resp:  [18] 18 (12/20 0456) BP: (121-130)/(63-67) 121/63 (12/20 0456) SpO2:  [94 %-98 %] 97 % (12/20 0456) Last BM Date : (P) 11/04/22  General:   Alert,  Well-developed, well-nourished, pleasant and cooperative in NAD Head:  Normocephalic and atraumatic. Eyes:  Sclera clear, no icterus.   Conjunctiva pink. Ears:  Normal auditory acuity. Mouth:  No deformity or lesions, dentition normal. Lungs:  Clear throughout to auscultation.   No wheezes, crackles, or rhonchi. No acute distress. Heart:  Regular rate and rhythm; no murmurs, clicks, rubs,  or gallops. Abdomen:  Soft, and nondistended. TTP of diffuse lower abdomen. No masses, hepatosplenomegaly or hernias noted. Normal bowel sounds, without guarding, and without rebound.   Msk:  Symmetrical without gross deformities. Normal posture. Extremities:  Without clubbing or edema. Neurologic:  Alert and  oriented x4. Skin:  Intact without significant lesions or rashes. Psych:  Alert and cooperative. Normal mood and affect.  Intake/Output from previous day: 12/19 0701 - 12/20 0700 In: 720 [P.O.:720] Out: 300 [Urine:300] Intake/Output this shift: No intake/output data recorded.  @WEIGHTS @  Labs/Studies   Recent Labs Recent Labs    11/03/22 0421 11/04/22 0333 11/05/22 0349  WBC 5.5 8.1 13.3*  HGB 13.2 11.7* 12.2  HCT 40.5 35.1* 36.3  PLT 226 250 255   BMET Recent Labs    11/03/22 0421 11/04/22 0333 11/05/22 0349  NA 138 136 136  K 4.1 3.5 3.6  CL 112* 115*  115*  CO2 18* 13* 13*  GLUCOSE 143* 122* 130*  BUN 30* 31* 33*  CREATININE 1.97* 2.26* 2.71*  CALCIUM 8.5* 8.2* 8.3*     Assessment   Renee Maldonado is a 76 y.o. year old female with pmh of anxiety, arthritis, breast cancer, COPD, GERD, HLD, HTN, OA, thyroid cancer who presented to the ED on 12/16 for profuse watery diarrhea x1 week, found to have adenovirus on stool testing. GI consulted for further management.   Adenovirus: profuse watery diarrhea for almost 2 weeks with intermittent lower abdominal cramping. No obvious rectal bleeding or melena. Last TCS in April of 2022 at [facility], unable to review report for this. She is currently on probiotic, lomotil and imodium. She  continues to have profuse diarrhea. 1L of stool output so far today. Will start cholestyramine 4g BID in attempt to help slow stool frequency. Given adenovirus, will need lactose free diet once advanced past clears. Continue with good hydration, monitoring electrolyte balance.   New findings of Cirrhosis: will need further outpatient workup with serologies to rule out acute hepatitis, AIH, wilsons disease, iron overload as cause. LFTs WNL this admission. Plt count 255k.    Plan / Recommendations   Continue with supportive measures Continue daily probiotic Continue Imodium and lomotil Zofran PRN for nausea Outpatient cirrhosis workup after acute illness 6. Start cholestyramine 4g BID 7. Will need lactose free diet once advanced past clears    11/05/2022, 9:59 AM  Lenox Ladouceur L. Alver Sorrow, MSN, APRN, AGNP-C Adult-Gerontology Nurse Practitioner [facility] at [facility]

## 2022-11-05 NOTE — Progress Notes (Signed)
PROGRESS NOTE    Renee Maldonado  XBJ:[id number] DOB: 08-17-1946 DOA: 11/01/2022 PCP: Claretta Fraise, MD   Brief Narrative:    Renee Maldonado is a 76 y.o. female with medical history significant for hypertension, dyslipidemia, anxiety disorder, COPD, GERD, and prior thyroid cancer with resection who presented to the ED with ongoing profuse, watery diarrhea that has been ongoing for the last 1 week.  She was admitted for evaluation of diarrhea with associated severe hypokalemia as well as some AKI related to this.  She is noted to have adenovirus gastroenteritis.  She continues to have ongoing significant diarrhea with nausea and vomiting that requires aggressive IV fluid repletion.  Zofran and Lomotil scheduled.  Assessment & Plan:   Principal Problem:   Hypokalemia Active Problems:   Hyperlipidemia   Hypertension   GAD (generalized anxiety disorder)   GERD (gastroesophageal reflux disease)   COPD (chronic obstructive pulmonary disease) (HCC)   Primary osteoarthritis of right knee   Papillary thyroid carcinoma (HCC)   Diarrhea   Gastroenteritis   AKI (acute kidney injury) ([place])  Assessment and Plan:   Severe hypokalemia secondary to adenovirus diarrhea-hypokalemia resolved, diarrhea ongoing -Continue aggressive IV fluid repletion -C. difficile negative -Start clear liquid diet for now and advance diet as diarrhea improves -Imodium as needed -Scheduled Lomotil and Zofran 12/19, discussed case with GI with recommendations to continue the same and add probiotics -continue aggressive supportive measures -requested GI consult    AKI on CKD stage IIIa -Appears prerenal in the setting of ongoing diarrhea -Aggressive IV fluid ongoing, increase fluid rate to 150 cc/h -Avoid nephrotoxic agents -Monitor repeat labs   Mild hyponatremia-resolved -Likely related to dehydration, monitor on normal saline infusion -Also uses HCTZ which has been held at this time   Dyslipidemia -Continue  home medications   Hypertension -Hold home HCTZ and continue other medications   GERD -Continue on PPI   History of COPD with prior tobacco abuse -Continue Breo -Rescue inhalers as needed   Obesity -BMI 35.45      DVT prophylaxis: Heparin Code Status: Full Family Communication: Daughter at bedside 12/16 Disposition Plan:  Status is: Inpatient Remains inpatient appropriate because: Need for IV medications.   Consultants:  None   Procedures:  None   Antimicrobials:  None  Subjective: Continues to have profuse diarrhea and poor tolerance of clear liquids   Objective: Vitals:   11/04/22 1301 11/04/22 2044 11/05/22 0456 11/05/22 1420  BP: 130/66 122/67 121/63 (!) 112/52  Pulse: 89 97 98 89  Resp: 18 18 18 16   Temp: 98 F (36.7 C) (!) 97 F (36.1 C) (!) 97 F (36.1 C) 97.6 F (36.4 C)  TempSrc: Oral   Oral  SpO2: 98% 94% 97% 96%  Weight:      Height:        Intake/Output Summary (Last 24 hours) at 11/05/2022 1715 Last data filed at 11/05/2022 1110 Gross per 24 hour  Intake 360 ml  Output 1300 ml  Net -940 ml   Filed Weights   11/01/22 0215 11/02/22 0534  Weight: 85.1 kg 74.7 kg    Examination:  General exam: Appears calm and comfortable  Respiratory system: Clear to auscultation. Respiratory effort normal. Cardiovascular system: S1 & S2 heard, RRR.  Gastrointestinal system: Abdomen is soft, ND/NT, no HSM; BS normal.  Central nervous system: Alert and awake Extremities: No edema Skin: No significant lesions noted Psychiatry: normal pleasant affect.  Data Reviewed: I have personally reviewed following labs and imaging studies  CBC:  Recent Labs  Lab 11/01/22 0252 11/02/22 0526 11/03/22 0421 11/04/22 0333 11/05/22 0349  WBC 4.4 2.6* 5.5 8.1 13.3*  NEUTROABS 3.3  --   --   --   --   HGB 13.8 12.9 13.2 11.7* 12.2  HCT 40.9 38.1 40.5 35.1* 36.3  MCV 79.6* 80.5 82.7 81.4 80.7  PLT 289 259 226 250 485   Basic Metabolic Panel: Recent Labs   Lab 11/01/22 0252 11/01/22 1541 11/02/22 0526 11/03/22 0421 11/04/22 0333 11/05/22 0349  NA 132*  --  140 138 136 136  K 2.1* 3.4* 3.2* 4.1 3.5 3.6  CL 96*  --  112* 112* 115* 115*  CO2 25  --  19* 18* 13* 13*  GLUCOSE 139*  --  138* 143* 122* 130*  BUN 32*  --  25* 30* 31* 33*  CREATININE 2.14*  --  1.75* 1.97* 2.26* 2.71*  CALCIUM 8.8*  --  8.3* 8.5* 8.2* 8.3*  MG  --   --  1.7 1.6* 2.3 2.1   GFR: Estimated Creatinine Clearance: 16.1 mL/min (A) (by C-G formula based on SCr of 2.71 mg/dL (H)). Liver Function Tests: Recent Labs  Lab 11/01/22 0252 11/02/22 0526  AST 27 16  ALT 21 16  ALKPHOS 69 61  BILITOT 1.1 0.7  PROT 7.8 6.7  ALBUMIN 4.5 3.8   Recent Labs  Lab 11/01/22 0252  LIPASE 26   No results for input(s): "AMMONIA" in the last 168 hours. Coagulation Profile: No results for input(s): "INR", "PROTIME" in the last 168 hours. Cardiac Enzymes: No results for input(s): "CKTOTAL", "CKMB", "CKMBINDEX", "TROPONINI" in the last 168 hours. BNP (last 3 results) No results for input(s): "PROBNP" in the last 8760 hours. HbA1C: No results for input(s): "HGBA1C" in the last 72 hours. CBG: Recent Labs  Lab 11/01/22 0214  GLUCAP 160*   Lipid Profile: No results for input(s): "CHOL", "HDL", "LDLCALC", "TRIG", "CHOLHDL", "LDLDIRECT" in the last 72 hours. Thyroid Function Tests: No results for input(s): "TSH", "T4TOTAL", "FREET4", "T3FREE", "THYROIDAB" in the last 72 hours. Anemia Panel: No results for input(s): "VITAMINB12", "FOLATE", "FERRITIN", "TIBC", "IRON", "RETICCTPCT" in the last 72 hours. Sepsis Labs: No results for input(s): "PROCALCITON", "LATICACIDVEN" in the last 168 hours.  Recent Results (from the past 240 hour(s))  Resp panel by RT-PCR (RSV, Flu A&B, Covid) Anterior Nasal Swab     Status: None   Collection Time: 11/01/22  2:39 AM   Specimen: Anterior Nasal Swab  Result Value Ref Range Status   SARS Coronavirus 2 by RT PCR NEGATIVE NEGATIVE Final     Comment: (NOTE) SARS-CoV-2 target nucleic acids are NOT DETECTED.  The SARS-CoV-2 RNA is generally detectable in upper respiratory specimens during the acute phase of infection. The lowest concentration of SARS-CoV-2 viral copies this assay can detect is 138 copies/mL. A negative result does not preclude SARS-Cov-2 infection and should not be used as the sole basis for treatment or other patient management decisions. A negative result may occur with  improper specimen collection/handling, submission of specimen other than nasopharyngeal swab, presence of viral mutation(s) within the areas targeted by this assay, and inadequate number of viral copies(<138 copies/mL). A negative result must be combined with clinical observations, patient history, and epidemiological information. The expected result is Negative.  Fact Sheet for Patients:  EntrepreneurPulse.com.au  Fact Sheet for Healthcare Providers:  IncredibleEmployment.be  This test is no t yet approved or cleared by the [place] FDA and  has been authorized for detection and/or diagnosis  of SARS-CoV-2 by FDA under an Emergency Use Authorization (EUA). This EUA will remain  in effect (meaning this test can be used) for the duration of the COVID-19 declaration under Section 564(b)(1) of the Act, 21 U.S.C.section 360bbb-3(b)(1), unless the authorization is terminated  or revoked sooner.       Influenza A by PCR NEGATIVE NEGATIVE Final   Influenza B by PCR NEGATIVE NEGATIVE Final    Comment: (NOTE) The Xpert Xpress SARS-CoV-2/FLU/RSV plus assay is intended as an aid in the diagnosis of influenza from Nasopharyngeal swab specimens and should not be used as a sole basis for treatment. Nasal washings and aspirates are unacceptable for Xpert Xpress SARS-CoV-2/FLU/RSV testing.  Fact Sheet for Patients: EntrepreneurPulse.com.au  Fact Sheet for Healthcare  Providers: IncredibleEmployment.be  This test is not yet approved or cleared by the [place] FDA and has been authorized for detection and/or diagnosis of SARS-CoV-2 by FDA under an Emergency Use Authorization (EUA). This EUA will remain in effect (meaning this test can be used) for the duration of the COVID-19 declaration under Section 564(b)(1) of the Act, 21 U.S.C. section 360bbb-3(b)(1), unless the authorization is terminated or revoked.     Resp Syncytial Virus by PCR NEGATIVE NEGATIVE Final    Comment: (NOTE) Fact Sheet for Patients: EntrepreneurPulse.com.au  Fact Sheet for Healthcare Providers: IncredibleEmployment.be  This test is not yet approved or cleared by the [place] FDA and has been authorized for detection and/or diagnosis of SARS-CoV-2 by FDA under an Emergency Use Authorization (EUA). This EUA will remain in effect (meaning this test can be used) for the duration of the COVID-19 declaration under Section 564(b)(1) of the Act, 21 U.S.C. section 360bbb-3(b)(1), unless the authorization is terminated or revoked.  Performed at [facility], [address] [place], [place] [address]   Gastrointestinal Panel by PCR , Stool     Status: Abnormal   Collection Time: 11/01/22  2:49 AM   Specimen: Urine, In & Out Cath; Stool  Result Value Ref Range Status   Campylobacter species NOT DETECTED NOT DETECTED Final   Plesimonas shigelloides NOT DETECTED NOT DETECTED Final   Salmonella species NOT DETECTED NOT DETECTED Final   Yersinia enterocolitica NOT DETECTED NOT DETECTED Final   Vibrio species NOT DETECTED NOT DETECTED Final   Vibrio cholerae NOT DETECTED NOT DETECTED Final   Enteroaggregative E coli (EAEC) NOT DETECTED NOT DETECTED Final   Enteropathogenic E coli (EPEC) NOT DETECTED NOT DETECTED Final   Enterotoxigenic E coli (ETEC) NOT DETECTED NOT DETECTED Final   Shiga like toxin producing E coli  (STEC) NOT DETECTED NOT DETECTED Final   Shigella/Enteroinvasive E coli (EIEC) NOT DETECTED NOT DETECTED Final   Cryptosporidium NOT DETECTED NOT DETECTED Final   Cyclospora cayetanensis NOT DETECTED NOT DETECTED Final   Entamoeba histolytica NOT DETECTED NOT DETECTED Final   Giardia lamblia NOT DETECTED NOT DETECTED Final   Adenovirus F40/41 DETECTED (A) NOT DETECTED Final   Astrovirus NOT DETECTED NOT DETECTED Final   Norovirus GI/GII NOT DETECTED NOT DETECTED Final   Rotavirus A NOT DETECTED NOT DETECTED Final   Sapovirus (I, II, IV, and V) NOT DETECTED NOT DETECTED Final    Comment: Performed at [facility], [address] [place], [place] [address]  C Difficile Quick Screen w PCR reflex     Status: None   Collection Time: 11/01/22  2:49 AM   Specimen: Urine, In & Out Cath; Stool  Result Value Ref Range Status   C Diff antigen NEGATIVE  NEGATIVE Final   C Diff toxin NEGATIVE NEGATIVE Final   C Diff interpretation No C. difficile detected.  Final    Comment: Performed at [facility], [address], [place], [place] [address]  MRSA Next Gen by PCR, Nasal     Status: None   Collection Time: 11/01/22  3:34 PM   Specimen: Nasal Mucosa; Nasal Swab  Result Value Ref Range Status   MRSA by PCR Next Gen NOT DETECTED NOT DETECTED Final    Comment: (NOTE) The GeneXpert MRSA Assay (FDA approved for NASAL specimens only), is one component of a comprehensive MRSA colonization surveillance program. It is not intended to diagnose MRSA infection nor to guide or monitor treatment for MRSA infections. Test performance is not FDA approved in patients less than 16 years old. Performed at [facility], [address] [place], [place] [address]     Radiology Studies: No results found.  Scheduled Meds:  amLODipine  5 mg Oral Daily   anastrozole  1 mg Oral q AM   Chlorhexidine Gluconate Cloth  6 each Topical Daily   cholestyramine  4 g Oral BID   diphenoxylate-atropine  1 tablet Oral  QID   famotidine  10 mg Oral Daily   fluticasone furoate-vilanterol  1 puff Inhalation Once per day on Sun Tue Thu Sat   heparin  5,000 Units Subcutaneous Q8H   melatonin  9 mg Oral QHS   ondansetron (ZOFRAN) IV  4 mg Intravenous Q6H   raloxifene  60 mg Oral Daily   rosuvastatin  20 mg Oral Daily   saccharomyces boulardii  250 mg Oral BID   traZODone  100 mg Oral QHS   Continuous Infusions:  sodium chloride 165 mL/hr at 11/05/22 1617     LOS: 4 days   Time spent: 35 minutes  Natara Monfort [name], MD [facility]  If 7PM-7AM, please contact night-coverage [email or web address] 11/05/2022, 5:15 PM

## 2022-11-06 DIAGNOSIS — F411 Generalized anxiety disorder: Secondary | ICD-10-CM | POA: Diagnosis not present

## 2022-11-06 DIAGNOSIS — R197 Diarrhea, unspecified: Secondary | ICD-10-CM | POA: Diagnosis not present

## 2022-11-06 DIAGNOSIS — E876 Hypokalemia: Secondary | ICD-10-CM | POA: Diagnosis not present

## 2022-11-06 DIAGNOSIS — N179 Acute kidney failure, unspecified: Secondary | ICD-10-CM | POA: Diagnosis not present

## 2022-11-06 DIAGNOSIS — K529 Noninfective gastroenteritis and colitis, unspecified: Secondary | ICD-10-CM | POA: Diagnosis not present

## 2022-11-06 LAB — CBC WITH DIFFERENTIAL/PLATELET
Abs Immature Granulocytes: 1 10*3/uL — ABNORMAL HIGH (ref 0.00–0.07)
Band Neutrophils: 6 %
Basophils Absolute: 0.1 10*3/uL (ref 0.0–0.1)
Basophils Relative: 1 %
Eosinophils Absolute: 0 10*3/uL (ref 0.0–0.5)
Eosinophils Relative: 0 %
HCT: 37.6 % (ref 36.0–46.0)
Hemoglobin: 12.4 g/dL (ref 12.0–15.0)
Lymphocytes Relative: 17 %
Lymphs Abs: 1.9 10*3/uL (ref 0.7–4.0)
MCH: 26.6 pg (ref 26.0–34.0)
MCHC: 33 g/dL (ref 30.0–36.0)
MCV: 80.5 fL (ref 80.0–100.0)
Metamyelocytes Relative: 5 %
Monocytes Absolute: 1 10*3/uL (ref 0.1–1.0)
Monocytes Relative: 9 %
Myelocytes: 4 %
Neutro Abs: 7 10*3/uL (ref 1.7–7.7)
Neutrophils Relative %: 58 %
Platelets: 259 10*3/uL (ref 150–400)
RBC: 4.67 MIL/uL (ref 3.87–5.11)
RDW: 14.3 % (ref 11.5–15.5)
WBC: 11 10*3/uL — ABNORMAL HIGH (ref 4.0–10.5)
nRBC: 0 % (ref 0.0–0.2)

## 2022-11-06 LAB — BASIC METABOLIC PANEL
Anion gap: 9 (ref 5–15)
BUN: 37 mg/dL — ABNORMAL HIGH (ref 8–23)
CO2: 14 mmol/L — ABNORMAL LOW (ref 22–32)
Calcium: 8.1 mg/dL — ABNORMAL LOW (ref 8.9–10.3)
Chloride: 113 mmol/L — ABNORMAL HIGH (ref 98–111)
Creatinine, Ser: 3.22 mg/dL — ABNORMAL HIGH (ref 0.44–1.00)
GFR, Estimated: 14 mL/min — ABNORMAL LOW (ref >60)
Glucose, Bld: 116 mg/dL — ABNORMAL HIGH (ref 70–99)
Potassium: 3.2 mmol/L — ABNORMAL LOW (ref 3.5–5.1)
Sodium: 136 mmol/L (ref 135–145)

## 2022-11-06 LAB — MAGNESIUM: Magnesium: 2.1 mg/dL (ref 1.7–2.4)

## 2022-11-06 MED ORDER — LACTATED RINGERS IV BOLUS
1000.0000 mL | Freq: Two times a day (BID) | INTRAVENOUS | Status: DC
  Administered 2022-11-06 – 2022-11-07 (×4): 1000 mL via INTRAVENOUS

## 2022-11-06 NOTE — Progress Notes (Signed)
PROGRESS NOTE    Renee Maldonado  UYQ:[id number] DOB: 11/27/1945 DOA: 11/01/2022 PCP: Claretta Fraise, MD   Brief Narrative:    Renee Maldonado is a 76 y.o. female with medical history significant for hypertension, dyslipidemia, anxiety disorder, COPD, GERD, and prior thyroid cancer with resection who presented to the ED with ongoing profuse, watery diarrhea that has been ongoing for the last 1 week.  She was admitted for evaluation of diarrhea with associated severe hypokalemia as well as some AKI related to this.  She is noted to have adenovirus gastroenteritis.  She continues to have ongoing significant diarrhea with nausea and vomiting that requires aggressive IV fluid repletion.  Zofran and Lomotil scheduled.  Assessment & Plan:   Principal Problem:   Hypokalemia Active Problems:   Hyperlipidemia   Hypertension   GAD (generalized anxiety disorder)   GERD (gastroesophageal reflux disease)   COPD (chronic obstructive pulmonary disease) (HCC)   Primary osteoarthritis of right knee   Papillary thyroid carcinoma (HCC)   Diarrhea   Gastroenteritis   AKI (acute kidney injury) ([place])  Assessment and Plan:  Severe hypokalemia secondary to adenovirus diarrhea-hypokalemia resolved, diarrhea ongoing -Continue aggressive IV fluid repletion -C. difficile negative -Start clear liquid diet for now and advance diet as diarrhea improves -Imodium as needed -Scheduled Lomotil and Zofran 12/19, discussed case with GI with recommendations to continue the same and add probiotics -continue aggressive supportive measures -requested GI consult    AKI on CKD stage IIIa -Appears prerenal in the setting of ongoing diarrhea -Aggressive IV fluid ongoing, increase fluid rate to 150 cc/h -Avoid nephrotoxic agents -worsening creatinine noted -consulted to nephrology on 12/21    Mild hyponatremia-resolved -Likely related to dehydration, monitor on normal saline infusion -Also uses HCTZ which has been held  at this time   Dyslipidemia -Continue home medications   Hypertension -Hold home HCTZ and continue other medications   GERD -Continue on PPI   History of COPD with prior tobacco abuse -Continue Breo -Rescue inhalers as needed   Obesity -BMI 35.45      DVT prophylaxis: Heparin Code Status: Full Family Communication: none present 12/21 during rounds Disposition Plan:  Status is: Inpatient Remains inpatient appropriate because: Need for IV medications.   Consultants:  None   Procedures:  None   Antimicrobials:  None  Subjective: Continues to have profuse diarrhea but able to tolerate full liquids better   Objective: Vitals:   11/05/22 2159 11/06/22 0529 11/06/22 0805 11/06/22 0900  BP: (!) 119/53 (!) 126/58    Pulse: 89 89    Resp: 18 18    Temp: (!) 96.9 F (36.1 C) (!) 96.9 F (36.1 C)    TempSrc:      SpO2: 95% 98% 93% 100%  Weight:      Height:        Intake/Output Summary (Last 24 hours) at 11/06/2022 1012 Last data filed at 11/06/2022 0700 Gross per 24 hour  Intake 9732.2 ml  Output 4450 ml  Net 5282.2 ml   Filed Weights   11/01/22 0215 11/02/22 0534  Weight: 85.1 kg 74.7 kg    Examination:  General exam: Appears calm and comfortable  Respiratory system: Clear to auscultation. Respiratory effort normal. Cardiovascular system: S1 & S2 heard, RRR.  Gastrointestinal system: Abdomen is soft, ND/NT, no HSM; BS normal.  Central nervous system: Alert and awake Extremities: No edema Skin: No significant lesions noted Psychiatry: normal pleasant affect.  Data Reviewed: I have personally reviewed following labs and imaging studies  CBC: Recent Labs  Lab 11/01/22 0252 11/02/22 0526 11/03/22 0421 11/04/22 0333 11/05/22 0349 11/06/22 0330  WBC 4.4 2.6* 5.5 8.1 13.3* 11.0*  NEUTROABS 3.3  --   --   --   --  7.0  HGB 13.8 12.9 13.2 11.7* 12.2 12.4  HCT 40.9 38.1 40.5 35.1* 36.3 37.6  MCV 79.6* 80.5 82.7 81.4 80.7 80.5  PLT 289 259 226  250 255 101   Basic Metabolic Panel: Recent Labs  Lab 11/02/22 0526 11/03/22 0421 11/04/22 0333 11/05/22 0349 11/06/22 0330  NA 140 138 136 136 136  K 3.2* 4.1 3.5 3.6 3.2*  CL 112* 112* 115* 115* 113*  CO2 19* 18* 13* 13* 14*  GLUCOSE 138* 143* 122* 130* 116*  BUN 25* 30* 31* 33* 37*  CREATININE 1.75* 1.97* 2.26* 2.71* 3.22*  CALCIUM 8.3* 8.5* 8.2* 8.3* 8.1*  MG 1.7 1.6* 2.3 2.1 2.1   GFR: Estimated Creatinine Clearance: 13.5 mL/min (A) (by C-G formula based on SCr of 3.22 mg/dL (H)). Liver Function Tests: Recent Labs  Lab 11/01/22 0252 11/02/22 0526  AST 27 16  ALT 21 16  ALKPHOS 69 61  BILITOT 1.1 0.7  PROT 7.8 6.7  ALBUMIN 4.5 3.8   Recent Labs  Lab 11/01/22 0252  LIPASE 26   No results for input(s): "AMMONIA" in the last 168 hours. Coagulation Profile: No results for input(s): "INR", "PROTIME" in the last 168 hours. Cardiac Enzymes: No results for input(s): "CKTOTAL", "CKMB", "CKMBINDEX", "TROPONINI" in the last 168 hours. BNP (last 3 results) No results for input(s): "PROBNP" in the last 8760 hours. HbA1C: No results for input(s): "HGBA1C" in the last 72 hours. CBG: Recent Labs  Lab 11/01/22 0214  GLUCAP 160*   Lipid Profile: No results for input(s): "CHOL", "HDL", "LDLCALC", "TRIG", "CHOLHDL", "LDLDIRECT" in the last 72 hours. Thyroid Function Tests: No results for input(s): "TSH", "T4TOTAL", "FREET4", "T3FREE", "THYROIDAB" in the last 72 hours. Anemia Panel: No results for input(s): "VITAMINB12", "FOLATE", "FERRITIN", "TIBC", "IRON", "RETICCTPCT" in the last 72 hours. Sepsis Labs: No results for input(s): "PROCALCITON", "LATICACIDVEN" in the last 168 hours.  Recent Results (from the past 240 hour(s))  Resp panel by RT-PCR (RSV, Flu A&B, Covid) Anterior Nasal Swab     Status: None   Collection Time: 11/01/22  2:39 AM   Specimen: Anterior Nasal Swab  Result Value Ref Range Status   SARS Coronavirus 2 by RT PCR NEGATIVE NEGATIVE Final     Comment: (NOTE) SARS-CoV-2 target nucleic acids are NOT DETECTED.  The SARS-CoV-2 RNA is generally detectable in upper respiratory specimens during the acute phase of infection. The lowest concentration of SARS-CoV-2 viral copies this assay can detect is 138 copies/mL. A negative result does not preclude SARS-Cov-2 infection and should not be used as the sole basis for treatment or other patient management decisions. A negative result may occur with  improper specimen collection/handling, submission of specimen other than nasopharyngeal swab, presence of viral mutation(s) within the areas targeted by this assay, and inadequate number of viral copies(<138 copies/mL). A negative result must be combined with clinical observations, patient history, and epidemiological information. The expected result is Negative.  Fact Sheet for Patients:  EntrepreneurPulse.com.au  Fact Sheet for Healthcare Providers:  IncredibleEmployment.be  This test is no t yet approved or cleared by the [place] FDA and  has been authorized for detection and/or diagnosis of SARS-CoV-2 by FDA under an Emergency Use Authorization (EUA). This EUA will remain  in effect (meaning this test  can be used) for the duration of the COVID-19 declaration under Section 564(b)(1) of the Act, 21 U.S.C.section 360bbb-3(b)(1), unless the authorization is terminated  or revoked sooner.       Influenza A by PCR NEGATIVE NEGATIVE Final   Influenza B by PCR NEGATIVE NEGATIVE Final    Comment: (NOTE) The Xpert Xpress SARS-CoV-2/FLU/RSV plus assay is intended as an aid in the diagnosis of influenza from Nasopharyngeal swab specimens and should not be used as a sole basis for treatment. Nasal washings and aspirates are unacceptable for Xpert Xpress SARS-CoV-2/FLU/RSV testing.  Fact Sheet for Patients: EntrepreneurPulse.com.au  Fact Sheet for Healthcare  Providers: IncredibleEmployment.be  This test is not yet approved or cleared by the [place] FDA and has been authorized for detection and/or diagnosis of SARS-CoV-2 by FDA under an Emergency Use Authorization (EUA). This EUA will remain in effect (meaning this test can be used) for the duration of the COVID-19 declaration under Section 564(b)(1) of the Act, 21 U.S.C. section 360bbb-3(b)(1), unless the authorization is terminated or revoked.     Resp Syncytial Virus by PCR NEGATIVE NEGATIVE Final    Comment: (NOTE) Fact Sheet for Patients: EntrepreneurPulse.com.au  Fact Sheet for Healthcare Providers: IncredibleEmployment.be  This test is not yet approved or cleared by the [place] FDA and has been authorized for detection and/or diagnosis of SARS-CoV-2 by FDA under an Emergency Use Authorization (EUA). This EUA will remain in effect (meaning this test can be used) for the duration of the COVID-19 declaration under Section 564(b)(1) of the Act, 21 U.S.C. section 360bbb-3(b)(1), unless the authorization is terminated or revoked.  Performed at [facility], [address] [place], [place] [address]   Gastrointestinal Panel by PCR , Stool     Status: Abnormal   Collection Time: 11/01/22  2:49 AM   Specimen: Urine, In & Out Cath; Stool  Result Value Ref Range Status   Campylobacter species NOT DETECTED NOT DETECTED Final   Plesimonas shigelloides NOT DETECTED NOT DETECTED Final   Salmonella species NOT DETECTED NOT DETECTED Final   Yersinia enterocolitica NOT DETECTED NOT DETECTED Final   Vibrio species NOT DETECTED NOT DETECTED Final   Vibrio cholerae NOT DETECTED NOT DETECTED Final   Enteroaggregative E coli (EAEC) NOT DETECTED NOT DETECTED Final   Enteropathogenic E coli (EPEC) NOT DETECTED NOT DETECTED Final   Enterotoxigenic E coli (ETEC) NOT DETECTED NOT DETECTED Final   Shiga like toxin producing E coli  (STEC) NOT DETECTED NOT DETECTED Final   Shigella/Enteroinvasive E coli (EIEC) NOT DETECTED NOT DETECTED Final   Cryptosporidium NOT DETECTED NOT DETECTED Final   Cyclospora cayetanensis NOT DETECTED NOT DETECTED Final   Entamoeba histolytica NOT DETECTED NOT DETECTED Final   Giardia lamblia NOT DETECTED NOT DETECTED Final   Adenovirus F40/41 DETECTED (A) NOT DETECTED Final   Astrovirus NOT DETECTED NOT DETECTED Final   Norovirus GI/GII NOT DETECTED NOT DETECTED Final   Rotavirus A NOT DETECTED NOT DETECTED Final   Sapovirus (I, II, IV, and V) NOT DETECTED NOT DETECTED Final    Comment: Performed at [facility], [address] [place], [place] [address]  C Difficile Quick Screen w PCR reflex     Status: None   Collection Time: 11/01/22  2:49 AM   Specimen: Urine, In & Out Cath; Stool  Result Value Ref Range Status   C Diff antigen NEGATIVE NEGATIVE Final   C Diff toxin NEGATIVE NEGATIVE Final   C Diff interpretation No C. difficile detected.  Final    Comment: Performed at [facility], [address], [place], [place] [address]  MRSA Next Gen by PCR, Nasal     Status: None   Collection Time: 11/01/22  3:34 PM   Specimen: Nasal Mucosa; Nasal Swab  Result Value Ref Range Status   MRSA by PCR Next Gen NOT DETECTED NOT DETECTED Final    Comment: (NOTE) The GeneXpert MRSA Assay (FDA approved for NASAL specimens only), is one component of a comprehensive MRSA colonization surveillance program. It is not intended to diagnose MRSA infection nor to guide or monitor treatment for MRSA infections. Test performance is not FDA approved in patients less than 73 years old. Performed at [facility], [address], [place],  [address]     Radiology Studies: No results found.  Scheduled Meds:  anastrozole  1 mg Oral q AM   Chlorhexidine Gluconate Cloth  6 each Topical Daily   cholestyramine  4 g Oral BID   diphenoxylate-atropine  1 tablet Oral QID   famotidine  10 mg Oral  Daily   fluticasone furoate-vilanterol  1 puff Inhalation Once per day on Sun Tue Thu Sat   heparin  5,000 Units Subcutaneous Q8H   melatonin  9 mg Oral QHS   ondansetron (ZOFRAN) IV  4 mg Intravenous Q6H   raloxifene  60 mg Oral Daily   rosuvastatin  20 mg Oral Daily   saccharomyces boulardii  250 mg Oral BID   traZODone  100 mg Oral QHS   Continuous Infusions:  sodium chloride 165 mL/hr at 11/05/22 2155   lactated ringers      LOS: 5 days   Time spent: 35 minutes  Jissell Trafton [name], MD [facility]  If 7PM-7AM, please contact night-coverage [email or web address] 11/06/2022, 10:12 AM

## 2022-11-06 NOTE — Progress Notes (Signed)
Patient through this shift only waking when staff entered the room for medications, to empty flexisel, and vitals. No complaints of pain but did stated "I'm having Nausea", Zofran given as ordered. Continued to monitor patient.

## 2022-11-06 NOTE — Progress Notes (Signed)
Gastroenterology Progress Note   Referring Provider: No ref. provider found Primary Care Physician:  Renee Fraise, MD Primary Gastroenterologist:  Dr. Abbey Maldonado  Patient ID: Renee Maldonado; [id number]; January 02, 1946   Subjective:    Patient states she is hungry. Wants to advance diet some. She complains of abdominal pain and burning with urination. States she finds it hard to urinate. No nausea today.   Objective:   Vital signs in last 24 hours: Temp:  [96.9 F (36.1 C)-98.1 F (36.7 C)] 98.1 F (36.7 C) (12/21 1300) Pulse Rate:  [89-90] 90 (12/21 1300) Resp:  [16-18] 18 (12/21 1300) BP: (110-126)/(52-58) 110/58 (12/21 1300) SpO2:  [93 %-100 %] 99 % (12/21 1300) Last BM Date : (P) 11/04/22 General:   Alert,  Well-developed, well-nourished, pleasant and cooperative in NAD Head:  Normocephalic and atraumatic. Eyes:  Sclera clear, no icterus.  Abdomen:  Soft, nontender and nondistended.  Normal bowel sounds, without guarding, and without rebound.   Extremities:  Without clubbing, deformity or edema. Neurologic:  Alert and  oriented x4;  grossly normal neurologically. Skin:  Intact without significant lesions or rashes. Psych:  Alert and cooperative. Normal mood and affect.  Intake/Output from previous day: 12/20 0701 - 12/21 0700 In: 9732.2 [P.O.:960; I.V.:8772.2] Out: 4450 [Urine:650; Stool:3800] Intake/Output this shift: Total I/O In: 600 [P.O.:600] Out: 1000 [Stool:1000]  Lab Results: CBC Recent Labs    11/04/22 0333 11/05/22 0349 11/06/22 0330  WBC 8.1 13.3* 11.0*  HGB 11.7* 12.2 12.4  HCT 35.1* 36.3 37.6  MCV 81.4 80.7 80.5  PLT 250 255 259   BMET Recent Labs    11/04/22 0333 11/05/22 0349 11/06/22 0330  NA 136 136 136  K 3.5 3.6 3.2*  CL 115* 115* 113*  CO2 13* 13* 14*  GLUCOSE 122* 130* 116*  BUN 31* 33* 37*  CREATININE 2.26* 2.71* 3.22*  CALCIUM 8.2* 8.3* 8.1*   LFTs No results for input(s): "BILITOT", "BILIDIR", "IBILI", "ALKPHOS", "AST",  "ALT", "PROT", "ALBUMIN" in the last 72 hours. No results for input(s): "LIPASE" in the last 72 hours. PT/INR No results for input(s): "LABPROT", "INR" in the last 72 hours.       Imaging Studies: CT ABDOMEN PELVIS WO CONTRAST  Result Date: 11/01/2022 CLINICAL DATA:  Patient with a recently diagnosed non-small cell lung cancer right upper lobe, presents to the ED with nonlocalized acute abdomen pain and diarrhea since 10/25/2022. EXAM: CT ABDOMEN AND PELVIS WITHOUT CONTRAST TECHNIQUE: Multidetector CT imaging of the abdomen and pelvis was performed following the standard protocol without IV contrast. RADIATION DOSE REDUCTION: This exam was performed according to the departmental dose-optimization program which includes automated exposure control, adjustment of the mA and/or kV according to patient size and/or use of iterative reconstruction technique. COMPARISON:  Chest CT without contrast 09/09/2022, and previous chest CTs dating back to 03/03/2022, as well as PET-CT 04/30/2022. FINDINGS: Lower chest: Stable 1 cm right lower lobe ground-glass nodule on 4:21. Scattered linear scarring or atelectasis both lower lobes. No acute process. The cardiac size is normal. Small stable pericardial effusion again noted and small hiatal hernia. Hepatobiliary: Capsular nodularity along the left lobe is noted consistent with cirrhosis. There is no dilatation of the hepatic portal main vein. The gallbladder is mildly distended without calcified stones, wall thickening or biliary dilatation. The unenhanced liver disease not show evidence of a mass. There is mild steatosis with hepatic length measurement 17 cm. Pancreas: There is moderate partial pancreatic atrophy. The unenhanced pancreas otherwise unremarkable without contrast. Spleen:  Unremarkable without contrast.  No splenomegaly. Adrenals/Urinary Tract: There is no adrenal mass. Right kidney demonstrates a homogeneous thin walled 2 cm cyst in the outer mid to lower  pole cortex, Hounsfield density of 12.4. There is an 8 mm moderately dense exophytic lesion in the superior pole of this kidney which is too small to characterize. Both unenhanced kidneys are otherwise unremarkable. No follow-up imaging is recommended. For reference see JACR 2018 Feb; 264-273, Management of the Incidental Renal Mass on CT, [employer] 2021; [phone], Bosniak Classification of Cystic Renal Masses, Version 2019. There is no urinary stone or obstruction. The bladder is unremarkable allowing for the degree of distention. Stomach/Bowel: Small hiatal hernia. The gastric wall is contracted. There is no small bowel dilatation but there is fluid filling of normal caliber small bowel in the mid to lower abdomen with fluid levels compatible with ileus/enteritis There is no small bowel obstruction or inflammatory change. An appendix is not seen in this patient. There is fluid in the colon as well, but again no wall thickening. There are uncomplicated sigmoid diverticula. Vascular/Lymphatic: Aortic atherosclerosis. No enlarged abdominal or pelvic lymph nodes. Reproductive: The uterus is intact. There are small calcified uterine fibroids. No adnexal mass is seen. Other: There is no free air, free hemorrhage, free fluid or incarcerated hernia. There are small inguinal fat hernias. Musculoskeletal: There is lumbar facet hypertrophy greatest at L4-5 where there is grade 1 degenerative anterolisthesis. Osteopenia. No acute or other significant osseous findings. Asymmetrically advanced left hip DJD. IMPRESSION: 1. Fluid in the small bowel and colon consistent with ileus/enteritis. No bowel obstruction or inflammatory change. 2. Diverticulosis without evidence of diverticulitis. 3. Hepatic cirrhosis with mild steatosis. No ascites, portal vein dilatation or splenomegaly. 4. Aortic atherosclerosis. 5. Stable 1 cm right lower lobe ground-glass nodule. 6. Small hiatal hernia.  Small inguinal fat hernias. 7. Osteopenia  and degenerative change. Aortic Atherosclerosis (ICD10-I70.0). Electronically Signed   By: Telford Nab M.D.   On: 11/01/2022 05:52  [2 weeks]  Assessment:   Renee Maldonado is a 76 y.o. year old female with pmh of anxiety, arthritis, breast cancer, COPD, GERD, HLD, HTN, OA, thyroid cancer who presented to the ED on 12/16 for profuse watery diarrhea x1 week, found to have adenovirus on stool testing. GI consulted for further management.    Adenovirus: profuse watery diarrhea for almost 2 weeks with intermittent lower abdominal cramping. No obvious rectal bleeding or melena. Last TCS in April of 2022 at [facility], unable to review report for this. She is currently on probiotic, lomotil, and started [place] yesterday (has received two doses). She continues to have diarrhea. 1L of stool output so far today. From 12/20 7am to 12/21 7am she had 3825mL of stool recorded.  Dysuria/decreased urinary: patient reports that she is having difficulty urinating. She complains of burning with urination. She is concerned that she may UTI. Her creatinine has increased today. She has one urine occurrence today and has 200 cc noted in the canister.    New findings of Cirrhosis: will need further outpatient workup with serologies to rule out acute hepatitis, AIH, wilsons disease, iron overload as cause. LFTs WNL this admission. Plt count 255k.     Plan:   Advance diet to soft/low residue/lactose free. Continue questran, lomotil, florastor.  U/A ordered by attending.  IV fluids increased per attending.    LOS: 5 days   Laureen Ochs. Bernarda Caffey [facility] [phone] 12/21/20232:19 PM

## 2022-11-06 NOTE — Consult Note (Signed)
[facility] Renal Consultation Note  Requesting MD: johnson  Indication for Consultation: A on CRF  HPI:  Renee Maldonado is a 76 y.o. female with past medical history significant for HTN ( amlodipine and HCTZ) , COPD, prior thyroid cancer s/p resection.  She presented to the hospital on 12/16 with c/o profuse diarrhea.  CT scan without contrast showed ileitis/enteritis. The diarrhea has persisted in spite of medical management-  GI now has seen and added additional meds.  Pt has some baseline CKD- stage 3 with crt 1.1 to 1.4.  On 10/02/22 crt was 1.36.  When she presented on 12/16-  crt 2.14-  decreased initially but then started to climb on 12/18-  steadily rising to a level of 3.22 today. There are some soft BPs noted-  none below 768 systolic.  Imaging of kidneys was unremarkable-  a couple of cysts but not concerning-  U/A on 12/16 negative for cells, 100 of protein.  Appears to be more in than outs but I's and O's not fully recorded-  had nearly 4 liters of stool out on the first day    Creatinine, Ser  Date/Time Value Ref Range Status  11/06/2022 03:30 AM 3.22 (H) 0.44 - 1.00 mg/dL Final  11/05/2022 03:49 AM 2.71 (H) 0.44 - 1.00 mg/dL Final  11/04/2022 03:33 AM 2.26 (H) 0.44 - 1.00 mg/dL Final  11/03/2022 04:21 AM 1.97 (H) 0.44 - 1.00 mg/dL Final  11/02/2022 05:26 AM 1.75 (H) 0.44 - 1.00 mg/dL Final  11/01/2022 02:52 AM 2.14 (H) 0.44 - 1.00 mg/dL Final  10/02/2022 12:00 PM 1.36 (H) 0.44 - 1.00 mg/dL Final  07/14/2022 10:06 AM 1.32 (H) 0.57 - 1.00 mg/dL Final  04/15/2022 09:22 AM 1.25 (H) 0.44 - 1.00 mg/dL Final  11/25/2021 09:12 AM 1.23 (H) 0.57 - 1.00 mg/dL Final  09/23/2021 09:20 AM 1.16 (H) 0.57 - 1.00 mg/dL Final  03/19/2021 09:26 AM 1.04 (H) 0.57 - 1.00 mg/dL Final  12/13/2020 09:13 AM 1.23 (H) 0.57 - 1.00 mg/dL Final  10/09/2020 03:38 AM 1.46 (H) 0.44 - 1.00 mg/dL Final  10/02/2020 10:50 AM 1.41 (H) 0.44 - 1.00 mg/dL Final  06/12/2020 09:26 AM 1.20 (H) 0.57 - 1.00  mg/dL Final  12/14/2019 09:26 AM 1.05 (H) 0.57 - 1.00 mg/dL Final  07/01/2019 09:05 AM 1.21 (H) 0.57 - 1.00 mg/dL Final  09/27/2018 08:30 AM 1.11 (H) 0.57 - 1.00 mg/dL Final  02/08/2018 10:22 AM 1.03 (H) 0.57 - 1.00 mg/dL Final  07/23/2017 09:35 AM 1.07 (H) 0.57 - 1.00 mg/dL Final  01/20/2017 09:18 AM 1.00 0.57 - 1.00 mg/dL Final  10/22/2016 09:26 AM 1.06 (H) 0.57 - 1.00 mg/dL Final  07/22/2016 09:20 AM 1.17 (H) 0.57 - 1.00 mg/dL Final  04/18/2016 08:50 AM 1.10 (H) 0.57 - 1.00 mg/dL Final  11/30/2015 04:23 PM 1.11 (H) 0.57 - 1.00 mg/dL Final  08/30/2015 02:57 PM 1.00 0.57 - 1.00 mg/dL Final  03/08/2015 11:02 AM 0.95 0.57 - 1.00 mg/dL Final  12/04/2014 10:07 AM 0.99 0.57 - 1.00 mg/dL Final  08/28/2014 09:53 AM 1.10 (H) 0.57 - 1.00 mg/dL Final  05/17/2014 12:55 PM 1.32 (H) 0.57 - 1.00 mg/dL Final  04/26/2014 11:12 AM 1.29 (H) 0.57 - 1.00 mg/dL Final  01/17/2014 08:30 AM 1.33 (H) 0.57 - 1.00 mg/dL Final  10/06/2013 08:44 AM 1.14 (H) 0.57 - 1.00 mg/dL Final  06/29/2013 10:01 AM 1.07 (H) 0.57 - 1.00 mg/dL Final     PMHx:   Past Medical History:  Diagnosis Date   Anxiety  Arthritis    Cancer ([place])    breast Right no chemo or radiation  on Arimidex   COPD (chronic obstructive pulmonary disease) ([place])    on Breo   GERD (gastroesophageal reflux disease)    Humerus fracture 2007   Hyperlipidemia    Hypertension    Insomnia    OA (osteoarthritis) of knee 10/08/2020   Thyroid cancer ([place]) 2023    Past Surgical History:  Procedure Laterality Date   BREAST LUMPECTOMY Right 02/2019   BREAST SURGERY Right 01/2019   breast biopsy   BRONCHIAL BIOPSY  04/15/2022   Procedure: BRONCHIAL BIOPSIES;  Surgeon: Garner Nash, DO;  Location: [facility];  Service: Pulmonary;;   BRONCHIAL BRUSHINGS  04/15/2022   Procedure: BRONCHIAL BRUSHINGS;  Surgeon: Garner Nash, DO;  Location: [facility];  Service: Pulmonary;;   BRONCHIAL NEEDLE ASPIRATION BIOPSY  04/15/2022   Procedure:  BRONCHIAL NEEDLE ASPIRATION BIOPSIES;  Surgeon: Garner Nash, DO;  Location: [facility];  Service: Pulmonary;;   COLONOSCOPY  2022   FIDUCIAL MARKER PLACEMENT  04/15/2022   Procedure: FIDUCIAL MARKER PLACEMENT;  Surgeon: Garner Nash, DO;  Location: [facility];  Service: Pulmonary;;   JOINT REPLACEMENT     left knee   Left shoulder surgery     2007   RADICAL NECK DISSECTION Right 10/10/2022   Procedure: CENTRAL NECK DISSECTION;  Surgeon: Jenetta Downer, MD;  Location: [facility];  Service: ENT;  Laterality: Right;   THYROIDECTOMY Right 10/10/2022   Procedure: RIGHT THYROID LOBECTOMY;  Surgeon: Jenetta Downer, MD;  Location: [facility];  Service: ENT;  Laterality: Right;   TOTAL KNEE ARTHROPLASTY     2012 left   TOTAL KNEE ARTHROPLASTY Right 10/08/2020   Procedure: TOTAL KNEE ARTHROPLASTY;  Surgeon: Gaynelle Arabian, MD;  Location: [facility];  Service: Orthopedics;  Laterality: Right;   TUBAL LIGATION  1978   VIDEO BRONCHOSCOPY WITH RADIAL ENDOBRONCHIAL ULTRASOUND  04/15/2022   Procedure: VIDEO BRONCHOSCOPY WITH RADIAL ENDOBRONCHIAL ULTRASOUND;  Surgeon: Garner Nash, DO;  Location: [facility];  Service: Pulmonary;;    Family Hx:  Family History  Problem Relation Age of Onset   Alzheimer's disease Mother    Diabetes Mother    Stroke Father    Hypertension Father    Cancer Brother     Social History:  reports that she quit smoking about 8 years ago. Her smoking use included cigarettes. She has a 10.00 pack-year smoking history. She has never used smokeless tobacco. She reports current alcohol use of about 4.0 standard drinks of alcohol per week. She reports that she does not use drugs.  Allergies:  Allergies  Allergen Reactions   Benadryl [Diphenhydramine Hcl] Nausea Only    Dizziness   Penicillins     Had a rash around 20 years ago but unsure if it come from medicine.   Tolerated Cephalosporin Date: 10/09/20.      Medications: Prior to Admission medications    Medication Sig Start Date End Date Taking? Authorizing Provider  albuterol (VENTOLIN HFA) 108 (90 Base) MCG/ACT inhaler 2 puffs in lungs every 6 hours as needed for wheezing/short of breath. 12/13/20  Yes Stacks, Cletus Gash, MD  amLODipine (NORVASC) 5 MG tablet Take 1 tablet (5 mg total) by mouth daily. 07/14/22  Yes Stacks, Cletus Gash, MD  anastrozole (ARIMIDEX) 1 MG tablet Take 1 mg by mouth in the morning. 07/22/19  Yes [provider]  aspirin 500 MG tablet Take 500 mg by mouth every 6 (six) hours as needed (  pain.).   Yes [provider]  Calcium Carb-Cholecalciferol 600-20 MG-MCG TABS Take 1 tablet by mouth in the morning.   Yes [provider]  chlorpheniramine (CHLOR-TRIMETON) 4 MG tablet Take 4 mg by mouth See admin instructions. Take 1 tablet (4 mg) by mouth scheduled every night & may take an additional dose in the morning if needed for allergies.   Yes [provider]  cholecalciferol (VITAMIN D3) 25 MCG (1000 UNIT) tablet Take 1,000 Units by mouth in the morning.   Yes [provider]  cyanocobalamin (VITAMIN B12) 1000 MCG/ML injection Inject 1 mL (1,000 mcg total) into the skin every 30 (thirty) days. 07/14/22  Yes Claretta Fraise, MD  famotidine (PEPCID) 40 MG tablet Take 1 tablet (40 mg total) by mouth daily. 07/14/22  Yes Stacks, Cletus Gash, MD  fluticasone furoate-vilanterol (BREO ELLIPTA) 200-25 MCG/ACT AEPB Inhale 1 puff into the lungs daily. Patient taking differently: Inhale 1 puff into the lungs 4 (four) times a week. 07/14/22  Yes Stacks, Cletus Gash, MD  hydrochlorothiazide (HYDRODIURIL) 25 MG tablet Take 1 tablet (25 mg total) by mouth daily. 07/14/22  Yes Claretta Fraise, MD  hydrOXYzine (ATARAX/VISTARIL) 25 MG tablet Take 25 mg by mouth every 4 (four) hours as needed for itching.   Yes [provider]  L-Lysine 500 MG TABS Take 500 mg by mouth every evening.   Yes [provider]  Melatonin 10 MG CAPS Take 10 mg by mouth at bedtime.    Yes [provider]  Polyethyl Glycol-Propyl Glycol (LUBRICANT EYE DROPS) 0.4-0.3 % SOLN Place 1-2 drops into both eyes 3 (three) times daily as needed (dry/irritated eyes.).   Yes [provider]  raloxifene (EVISTA) 60 MG tablet Take 1 tablet (60 mg total) by mouth daily. 07/14/22  Yes Stacks, Cletus Gash, MD  RESTASIS 0.05 % ophthalmic emulsion Place 1 drop into both eyes 2 (two) times daily as needed (dry eyes).  01/18/14  Yes [provider]  rosuvastatin (CRESTOR) 20 MG tablet TAKE 1 TABLET DAILY Patient taking differently: Take 20 mg by mouth daily. 10/28/22  Yes Stacks, Cletus Gash, MD  sodium chloride (OCEAN) 0.65 % SOLN nasal spray Place 1 spray into both nostrils as needed for congestion.   Yes [provider]  traZODone (DESYREL) 100 MG tablet TAKE 1 TABLET AT BEDTIME Patient taking differently: Take 100 mg by mouth at bedtime. 05/12/22  Yes Stacks, Cletus Gash, MD  valACYclovir (VALTREX) 500 MG tablet TAKE (1) TABLET DAILY AS NEEDED. Patient taking differently: Take 500 mg by mouth daily as needed (fever blisters.). 12/13/20  Yes Stacks, Cletus Gash, MD  hydrocortisone cream 1 % Apply 1 Application topically daily as needed (dry/itchy skin on back). Patient not taking: Reported on 11/01/2022    [provider]  ondansetron (ZOFRAN) 4 MG tablet Take 1 tablet (4 mg total) by mouth every 8 (eight) hours as needed for nausea or vomiting. Patient not taking: Reported on 11/01/2022 10/31/22   Chevis Pretty, FNP    I have reviewed the patient's current medications.  Labs:  Results for orders placed or performed during the hospital encounter of 11/01/22 (from the past 48 hour(s))  Basic metabolic panel     Status: Abnormal   Collection Time: 11/05/22  3:49 AM  Result Value Ref Range   Sodium 136 135 - 145 mmol/L   Potassium 3.6 3.5 - 5.1 mmol/L   Chloride 115 (H) 98 - 111 mmol/L   CO2 13 (L) 22 - 32 mmol/L   Glucose, Bld 130 (  H) 70 - 99 mg/dL    Comment:  Glucose reference range applies only to samples taken after fasting for at least 8 hours.   BUN 33 (H) 8 - 23 mg/dL   Creatinine, Ser 2.71 (H) 0.44 - 1.00 mg/dL   Calcium 8.3 (L) 8.9 - 10.3 mg/dL   GFR, Estimated 18 (L) >60 mL/min    Comment: (NOTE) Calculated using the CKD-EPI Creatinine Equation (2021)    Anion gap 8 5 - 15    Comment: Performed at [facility], [address] [place], [place] [address]  Magnesium     Status: None   Collection Time: 11/05/22  3:49 AM  Result Value Ref Range   Magnesium 2.1 1.7 - 2.4 mg/dL    Comment: Performed at [facility], [address], [place], [place] [address]  CBC     Status: Abnormal   Collection Time: 11/05/22  3:49 AM  Result Value Ref Range   WBC 13.3 (H) 4.0 - 10.5 K/uL   RBC 4.50 3.87 - 5.11 MIL/uL   Hemoglobin 12.2 12.0 - 15.0 g/dL   HCT 36.3 36.0 - 46.0 %   MCV 80.7 80.0 - 100.0 fL   MCH 27.1 26.0 - 34.0 pg   MCHC 33.6 30.0 - 36.0 g/dL   RDW 14.2 11.5 - 15.5 %   Platelets 255 150 - 400 K/uL   nRBC 0.0 0.0 - 0.2 %    Comment: Performed at [facility], [address], [place], [place] [address]  CBC with Differential/Platelet     Status: Abnormal   Collection Time: 11/06/22  3:30 AM  Result Value Ref Range   WBC 11.0 (H) 4.0 - 10.5 K/uL   RBC 4.67 3.87 - 5.11 MIL/uL   Hemoglobin 12.4 12.0 - 15.0 g/dL   HCT 37.6 36.0 - 46.0 %   MCV 80.5 80.0 - 100.0 fL   MCH 26.6 26.0 - 34.0 pg   MCHC 33.0 30.0 - 36.0 g/dL   RDW 14.3 11.5 - 15.5 %   Platelets 259 150 - 400 K/uL   nRBC 0.0 0.0 - 0.2 %   Neutrophils Relative % 58 %   Neutro Abs 7.0 1.7 - 7.7 K/uL   Band Neutrophils 6 %   Lymphocytes Relative 17 %   Lymphs Abs 1.9 0.7 - 4.0 K/uL   Monocytes Relative 9 %   Monocytes Absolute 1.0 0.1 - 1.0 K/uL   Eosinophils Relative 0 %   Eosinophils Absolute 0.0 0.0 - 0.5 K/uL   Basophils Relative 1 %   Basophils Absolute 0.1 0.0 - 0.1 K/uL   WBC Morphology      MODERATE LEFT SHIFT (>5% METAS AND MYELOS,OCC PRO NOTED)   RBC  Morphology MORPHOLOGY UNREMARKABLE    Smear Review MORPHOLOGY UNREMARKABLE    Metamyelocytes Relative 5 %   Myelocytes 4 %   Abs Immature Granulocytes 1.00 (H) 0.00 - 0.07 K/uL    Comment: Performed at [facility], [address] [place], [place] [address]  Basic metabolic panel     Status: Abnormal   Collection Time: 11/06/22  3:30 AM  Result Value Ref Range   Sodium 136 135 - 145 mmol/L   Potassium 3.2 (L) 3.5 - 5.1 mmol/L   Chloride 113 (H) 98 - 111 mmol/L   CO2 14 (L) 22 - 32 mmol/L   Glucose, Bld 116 (H) 70 - 99 mg/dL    Comment: Glucose reference range applies only to samples taken after fasting for at least 8  hours.   BUN 37 (H) 8 - 23 mg/dL   Creatinine, Ser 3.22 (H) 0.44 - 1.00 mg/dL   Calcium 8.1 (L) 8.9 - 10.3 mg/dL   GFR, Estimated 14 (L) >60 mL/min    Comment: (NOTE) Calculated using the CKD-EPI Creatinine Equation (2021)    Anion gap 9 5 - 15    Comment: Performed at [facility], [address], [place], [place] [address]  Magnesium     Status: None   Collection Time: 11/06/22  3:30 AM  Result Value Ref Range   Magnesium 2.1 1.7 - 2.4 mg/dL    Comment: Performed at [facility], [address] [place], [place] [address]     ROS:  A comprehensive review of systems was negative except for: Cardiovascular: positive for dizziness Gastrointestinal: positive for diarrhea  Physical Exam: Vitals:   11/06/22 0529 11/06/22 0805  BP: (!) 126/58   Pulse: 89   Resp: 18   Temp: (!) 96.9 F (36.1 C)   SpO2: 98% 93%     General:  well conditioned WF-  NAD , sitting up in bedside chair HEENT: PERRLA, EOMI, mucous membranes moist  Neck: no JVD Heart: RRR Lungs: mostly clear Abdomen: soft, non tender Extremities: no edema Skin: dry Neuro: alert, non focal Bag filled with stool-  50-100 ccs in purewick  Assessment/Plan: 76 year old WF with profuse diarrhea-  ileitis/enteritis 1.Renal- A on CRF- baseline crt 1.2 to 1.4-  was over 2 upon admission in the setting of  volume depletion and cont diarrhea-  improved after fluid given the first day.  Since then -  the ins and outs have not been well recorded- has made up to 4 liters of diarrhea and not sure we have kept up with those losses.  I am going to recheck a urine.  Could have established ATN at this point so could be slower to resolve. 2. Hypertension/volume  - BP soft,  is having huge amounts of diarrhea still.  Unclear what I's and O's are.  Am going to stop the norvasc-  step up the IVF today -  cont 165 per hour but give another 2 liters per day of LR.  Check orthostatics to see if that gives more information  3. Hypokalemia-  LR boluses -  trying to eat today  4. Anemia  - not an issue    Louis Meckel 11/06/2022, 8:29 AM

## 2022-11-07 DIAGNOSIS — N179 Acute kidney failure, unspecified: Secondary | ICD-10-CM | POA: Diagnosis not present

## 2022-11-07 DIAGNOSIS — A09 Infectious gastroenteritis and colitis, unspecified: Secondary | ICD-10-CM | POA: Diagnosis not present

## 2022-11-07 DIAGNOSIS — E876 Hypokalemia: Secondary | ICD-10-CM | POA: Diagnosis not present

## 2022-11-07 DIAGNOSIS — K219 Gastro-esophageal reflux disease without esophagitis: Secondary | ICD-10-CM | POA: Diagnosis not present

## 2022-11-07 LAB — RENAL FUNCTION PANEL
Albumin: 2.6 g/dL — ABNORMAL LOW (ref 3.5–5.0)
Anion gap: 6 (ref 5–15)
BUN: 31 mg/dL — ABNORMAL HIGH (ref 8–23)
CO2: 12 mmol/L — ABNORMAL LOW (ref 22–32)
Calcium: 7.7 mg/dL — ABNORMAL LOW (ref 8.9–10.3)
Chloride: 118 mmol/L — ABNORMAL HIGH (ref 98–111)
Creatinine, Ser: 3.28 mg/dL — ABNORMAL HIGH (ref 0.44–1.00)
GFR, Estimated: 14 mL/min — ABNORMAL LOW (ref >60)
Glucose, Bld: 123 mg/dL — ABNORMAL HIGH (ref 70–99)
Phosphorus: 1.8 mg/dL — ABNORMAL LOW (ref 2.5–4.6)
Potassium: 3.6 mmol/L (ref 3.5–5.1)
Sodium: 136 mmol/L (ref 135–145)

## 2022-11-07 LAB — CBC WITH DIFFERENTIAL/PLATELET
Abs Immature Granulocytes: 0.2 10*3/uL — ABNORMAL HIGH (ref 0.00–0.07)
Basophils Absolute: 0 10*3/uL (ref 0.0–0.1)
Basophils Relative: 0 %
Eosinophils Absolute: 0 10*3/uL (ref 0.0–0.5)
Eosinophils Relative: 0 %
HCT: 32.3 % — ABNORMAL LOW (ref 36.0–46.0)
Hemoglobin: 10.9 g/dL — ABNORMAL LOW (ref 12.0–15.0)
Lymphocytes Relative: 18 %
Lymphs Abs: 1.7 10*3/uL (ref 0.7–4.0)
MCH: 26.7 pg (ref 26.0–34.0)
MCHC: 33.7 g/dL (ref 30.0–36.0)
MCV: 79 fL — ABNORMAL LOW (ref 80.0–100.0)
Monocytes Absolute: 1 10*3/uL (ref 0.1–1.0)
Monocytes Relative: 11 %
Neutro Abs: 6.4 10*3/uL (ref 1.7–7.7)
Neutrophils Relative %: 69 %
Platelets: 216 10*3/uL (ref 150–400)
Promyelocytes Relative: 2 %
RBC: 4.09 MIL/uL (ref 3.87–5.11)
RDW: 14.1 % (ref 11.5–15.5)
WBC: 9.3 10*3/uL (ref 4.0–10.5)
nRBC: 0 % (ref 0.0–0.2)

## 2022-11-07 LAB — BASIC METABOLIC PANEL
Anion gap: 6 (ref 5–15)
BUN: 34 mg/dL — ABNORMAL HIGH (ref 8–23)
CO2: 12 mmol/L — ABNORMAL LOW (ref 22–32)
Calcium: 7.3 mg/dL — ABNORMAL LOW (ref 8.9–10.3)
Chloride: 117 mmol/L — ABNORMAL HIGH (ref 98–111)
Creatinine, Ser: 3.25 mg/dL — ABNORMAL HIGH (ref 0.44–1.00)
GFR, Estimated: 14 mL/min — ABNORMAL LOW (ref >60)
Glucose, Bld: 102 mg/dL — ABNORMAL HIGH (ref 70–99)
Potassium: 2.3 mmol/L — CL (ref 3.5–5.1)
Sodium: 135 mmol/L (ref 135–145)

## 2022-11-07 MED ORDER — POTASSIUM CHLORIDE 20 MEQ PO PACK: 40.0000 meq | PACK | Freq: Once | ORAL | Status: DC

## 2022-11-07 MED ORDER — POTASSIUM CHLORIDE 20 MEQ PO PACK
40.0000 meq | PACK | Freq: Three times a day (TID) | ORAL | Status: AC
  Administered 2022-11-07 (×3): 40 meq via ORAL
  Filled 2022-11-07 (×3): qty 2

## 2022-11-07 NOTE — Progress Notes (Signed)
   11/07/22 [id number]  Provider Notification  Provider Name/Title Dr. Clearence Ped  Date Provider Notified 11/07/22  Time Provider Notified [phone]  Method of Notification  (Secure Chat)  Notification Reason Critical Result  Test performed and critical result K+ 2.3  Date Critical Result Received 11/07/22  Time Critical Result Received 0601  Provider response See new orders  Date of Provider Response 11/07/22  Time of Provider Response [phone]   Critical Lab: Potassium 2.3, Dr. Clearence Ped notified. See new orders in the chart.

## 2022-11-07 NOTE — Progress Notes (Signed)
Subjective:  Continues to have major stool output-  nearly 4 liters-  was 3 liters positive yesterday however-  BUN and crt same today -  lower K this AM-  urinalysis has not been sent -  UOP at least 700-  feels like she has to void but can't-  purewick in place  Objective Vital signs in last 24 hours: Vitals:   11/06/22 0900 11/06/22 1300 11/06/22 2123 11/07/22 0545  BP:  (!) 110/58 134/64 (!) 125/43  Pulse:  90 87 84  Resp:  18 16 18   Temp:  98.1 F (36.7 C) 97.7 F (36.5 C) (!) 96.8 F (36 C)  TempSrc:  Axillary    SpO2: 100% 99% 100% 97%  Weight:      Height:       Weight change:   Intake/Output Summary (Last 24 hours) at 11/07/2022 0800 Last data filed at 11/07/2022 3810 Gross per 24 hour  Intake 7139.27 ml  Output 3800 ml  Net 3339.27 ml    Assessment/Plan: 76 year old WF with profuse diarrhea-  ileitis/enteritis 1.Renal- A on CRF- baseline crt 1.2 to 1.4-  was over 2 upon admission in the setting of volume depletion and cont diarrhea-  improved after fluid given the first day.  Since then -  the ins and outs have not been well recorded- has made up to 4 liters of diarrhea daily and not sure we have kept up with those losses.  I am going to recheck a urine-  still pending.  Could have established ATN at this point so could be slower to resolve. No acute needs for RRT.  Numbers essentially stable from yesterday to today -  cont higher dose IVF-  is non oliguric  2. Hypertension/volume  - BP soft,  is having huge amounts of diarrhea still.  Unclear what I's and O's are.   stopped the norvasc-  step up the IVF today  as well-  cont 165 per hour but give another 2 liters per day of LR again today.  orthostatics seem to be slightly positive-   3. Hypokalemia-  LR boluses -  trying to eat today-  give 120 PO-  check k later today   4. Anemia  - not an issue -  is decreasing with ivf-  not surprising but not to a critical point  We will follow at a distance over the weekend - call  with questions       Louis Meckel    Labs: Basic Metabolic Panel: Recent Labs  Lab 11/05/22 0349 11/06/22 0330 11/07/22 0332  NA 136 136 135  K 3.6 3.2* 2.3*  CL 115* 113* 117*  CO2 13* 14* 12*  GLUCOSE 130* 116* 102*  BUN 33* 37* 34*  CREATININE 2.71* 3.22* 3.25*  CALCIUM 8.3* 8.1* 7.3*   Liver Function Tests: Recent Labs  Lab 11/01/22 0252 11/02/22 0526  AST 27 16  ALT 21 16  ALKPHOS 69 61  BILITOT 1.1 0.7  PROT 7.8 6.7  ALBUMIN 4.5 3.8   Recent Labs  Lab 11/01/22 0252  LIPASE 26   No results for input(s): "AMMONIA" in the last 168 hours. CBC: Recent Labs  Lab 11/01/22 0252 11/02/22 0526 11/03/22 0421 11/04/22 0333 11/05/22 0349 11/06/22 0330 11/07/22 0332  WBC 4.4   < > 5.5 8.1 13.3* 11.0* 9.3  NEUTROABS 3.3  --   --   --   --  7.0 6.4  HGB 13.8   < > 13.2 11.7*  12.2 12.4 10.9*  HCT 40.9   < > 40.5 35.1* 36.3 37.6 32.3*  MCV 79.6*   < > 82.7 81.4 80.7 80.5 79.0*  PLT 289   < > 226 250 255 259 216   < > = values in this interval not displayed.   Cardiac Enzymes: No results for input(s): "CKTOTAL", "CKMB", "CKMBINDEX", "TROPONINI" in the last 168 hours. CBG: Recent Labs  Lab 11/01/22 0214  GLUCAP 160*    Iron Studies: No results for input(s): "IRON", "TIBC", "TRANSFERRIN", "FERRITIN" in the last 72 hours. Studies/Results: No results found. Medications: Infusions:  sodium chloride 165 mL/hr at 11/07/22 1173   lactated ringers 1,000 mL (11/06/22 2125)    Scheduled Medications:  anastrozole  1 mg Oral q AM   Chlorhexidine Gluconate Cloth  6 each Topical Daily   cholestyramine  4 g Oral BID   diphenoxylate-atropine  1 tablet Oral QID   famotidine  10 mg Oral Daily   fluticasone furoate-vilanterol  1 puff Inhalation Once per day on Sun Tue Thu Sat   heparin  5,000 Units Subcutaneous Q8H   melatonin  9 mg Oral QHS   ondansetron (ZOFRAN) IV  4 mg Intravenous Q6H   potassium chloride  40 mEq Oral TID   raloxifene  60 mg Oral  Daily   rosuvastatin  20 mg Oral Daily   saccharomyces boulardii  250 mg Oral BID   traZODone  100 mg Oral QHS    have reviewed scheduled and prn medications.  Physical Exam: General: NAD Heart: RRR Lungs: mostly clear Abdomen: soft, non tender-  I cannot palpate bladder Extremities: no edema     11/07/2022,8:00 AM  LOS: 6 days

## 2022-11-07 NOTE — Care Management Important Message (Signed)
Important Message  Patient Details  Name: Renee Maldonado MRN: [id number] Date of Birth: 1946-02-15   Medicare Important Message Given:  Other (see comment) (unabe to contact by phone at [phone], [phone], copy will be mailed to address on file due to contact precautions)     Tommy Medal 11/07/2022, 9:22 AM

## 2022-11-07 NOTE — Progress Notes (Signed)
PROGRESS NOTE    Renee Maldonado  SVX:[id number] DOB: 05/21/1946 DOA: 11/01/2022 PCP: Claretta Fraise, MD   Brief Narrative:    Renee Maldonado is a 76 y.o. female with medical history significant for hypertension, dyslipidemia, anxiety disorder, COPD, GERD, and prior thyroid cancer with resection who presented to the ED with ongoing profuse, watery diarrhea that has been ongoing for the last 1 week.  She was admitted for evaluation of diarrhea with associated severe hypokalemia as well as some AKI related to this.  She is noted to have adenovirus gastroenteritis.  She continues to have ongoing significant diarrhea with nausea and vomiting that requires aggressive IV fluid repletion.  Zofran and Lomotil scheduled.  Assessment & Plan:   Principal Problem:   Hypokalemia Active Problems:   Hyperlipidemia   Hypertension   GAD (generalized anxiety disorder)   GERD (gastroesophageal reflux disease)   COPD (chronic obstructive pulmonary disease) (HCC)   Primary osteoarthritis of right knee   Papillary thyroid carcinoma (HCC)   Diarrhea   Gastroenteritis   AKI (acute kidney injury) ([place])  Assessment and Plan:  Severe hypokalemia secondary to adenovirus diarrhea-hypokalemia resolved, diarrhea ongoing -Continue aggressive IV fluid repletion -C. difficile negative -Start clear liquid diet for now and advance diet as diarrhea improves -Imodium as needed -Scheduled Lomotil and Zofran 12/19, discussed case with GI with recommendations to continue the same and add probiotics -continue aggressive supportive measures -requested GI consult and appreciate assistance   AKI on CKD stage IIIa -Appears prerenal in the setting of ongoing diarrhea -Aggressive IV fluid ongoing, increase fluid rate to 150 cc/h -Avoid nephrotoxic agents -worsening creatinine noted -consulted to nephrology on 12/21  -increased hydration and suspect ATN    Mild hyponatremia-resolved -Likely related to dehydration, monitor  on normal saline infusion -Also uses HCTZ which has been held at this time   Dyslipidemia -Continue home medications   Hypertension -Hold home HCTZ and continue other medications   GERD -Continue on PPI   History of COPD with prior tobacco abuse -Continue Breo -Rescue inhalers as needed   Obesity -BMI 35.45      DVT prophylaxis: Heparin Code Status: Full Family Communication: none present 12/22 during rounds Disposition Plan:  Status is: Inpatient Remains inpatient appropriate because: Need for IV medications.   Consultants:  None   Procedures:  None   Antimicrobials:  None  Subjective: Diarrhea persists.    Objective: Vitals:   11/06/22 1300 11/06/22 2123 11/07/22 0545 11/07/22 1336  BP: (!) 110/58 134/64 (!) 125/43 (!) 163/68  Pulse: 90 87 84 89  Resp: 18 16 18 15   Temp: 98.1 F (36.7 C) 97.7 F (36.5 C) (!) 96.8 F (36 C) 97.8 F (36.6 C)  TempSrc: Axillary   Oral  SpO2: 99% 100% 97% 100%  Weight:      Height:        Intake/Output Summary (Last 24 hours) at 11/07/2022 1731 Last data filed at 11/07/2022 1300 Gross per 24 hour  Intake 6539.27 ml  Output 2300 ml  Net 4239.27 ml   Filed Weights   11/01/22 0215 11/02/22 0534  Weight: 85.1 kg 74.7 kg    Examination:  General exam: Appears calm and comfortable  Respiratory system: Clear to auscultation. Respiratory effort normal. Cardiovascular system: S1 & S2 heard, RRR.  Gastrointestinal system: Abdomen is soft, ND/NT, no HSM; BS normal.  Central nervous system: Alert and awake Extremities: No edema Skin: No significant lesions noted Psychiatry: normal pleasant affect.  Data Reviewed: I have personally reviewed  following labs and imaging studies  CBC: Recent Labs  Lab 11/01/22 0252 11/02/22 0526 11/03/22 0421 11/04/22 0333 11/05/22 0349 11/06/22 0330 11/07/22 0332  WBC 4.4   < > 5.5 8.1 13.3* 11.0* 9.3  NEUTROABS 3.3  --   --   --   --  7.0 6.4  HGB 13.8   < > 13.2 11.7* 12.2  12.4 10.9*  HCT 40.9   < > 40.5 35.1* 36.3 37.6 32.3*  MCV 79.6*   < > 82.7 81.4 80.7 80.5 79.0*  PLT 289   < > 226 250 255 259 216   < > = values in this interval not displayed.   Basic Metabolic Panel: Recent Labs  Lab 11/02/22 0526 11/03/22 0421 11/04/22 0333 11/05/22 0349 11/06/22 0330 11/07/22 0332 11/07/22 1515  NA 140 138 136 136 136 135 136  K 3.2* 4.1 3.5 3.6 3.2* 2.3* 3.6  CL 112* 112* 115* 115* 113* 117* 118*  CO2 19* 18* 13* 13* 14* 12* 12*  GLUCOSE 138* 143* 122* 130* 116* 102* 123*  BUN 25* 30* 31* 33* 37* 34* 31*  CREATININE 1.75* 1.97* 2.26* 2.71* 3.22* 3.25* 3.28*  CALCIUM 8.3* 8.5* 8.2* 8.3* 8.1* 7.3* 7.7*  MG 1.7 1.6* 2.3 2.1 2.1  --   --   PHOS  --   --   --   --   --   --  1.8*   GFR: Estimated Creatinine Clearance: 13.3 mL/min (A) (by C-G formula based on SCr of 3.28 mg/dL (H)). Liver Function Tests: Recent Labs  Lab 11/01/22 0252 11/02/22 0526 11/07/22 1515  AST 27 16  --   ALT 21 16  --   ALKPHOS 69 61  --   BILITOT 1.1 0.7  --   PROT 7.8 6.7  --   ALBUMIN 4.5 3.8 2.6*   Recent Labs  Lab 11/01/22 0252  LIPASE 26   No results for input(s): "AMMONIA" in the last 168 hours. Coagulation Profile: No results for input(s): "INR", "PROTIME" in the last 168 hours. Cardiac Enzymes: No results for input(s): "CKTOTAL", "CKMB", "CKMBINDEX", "TROPONINI" in the last 168 hours. BNP (last 3 results) No results for input(s): "PROBNP" in the last 8760 hours. HbA1C: No results for input(s): "HGBA1C" in the last 72 hours. CBG: Recent Labs  Lab 11/01/22 0214  GLUCAP 160*   Lipid Profile: No results for input(s): "CHOL", "HDL", "LDLCALC", "TRIG", "CHOLHDL", "LDLDIRECT" in the last 72 hours. Thyroid Function Tests: No results for input(s): "TSH", "T4TOTAL", "FREET4", "T3FREE", "THYROIDAB" in the last 72 hours. Anemia Panel: No results for input(s): "VITAMINB12", "FOLATE", "FERRITIN", "TIBC", "IRON", "RETICCTPCT" in the last 72 hours. Sepsis  Labs: No results for input(s): "PROCALCITON", "LATICACIDVEN" in the last 168 hours.  Recent Results (from the past 240 hour(s))  Resp panel by RT-PCR (RSV, Flu A&B, Covid) Anterior Nasal Swab     Status: None   Collection Time: 11/01/22  2:39 AM   Specimen: Anterior Nasal Swab  Result Value Ref Range Status   SARS Coronavirus 2 by RT PCR NEGATIVE NEGATIVE Final    Comment: (NOTE) SARS-CoV-2 target nucleic acids are NOT DETECTED.  The SARS-CoV-2 RNA is generally detectable in upper respiratory specimens during the acute phase of infection. The lowest concentration of SARS-CoV-2 viral copies this assay can detect is 138 copies/mL. A negative result does not preclude SARS-Cov-2 infection and should not be used as the sole basis for treatment or other patient management decisions. A negative result may occur with  improper  specimen collection/handling, submission of specimen other than nasopharyngeal swab, presence of viral mutation(s) within the areas targeted by this assay, and inadequate number of viral copies(<138 copies/mL). A negative result must be combined with clinical observations, patient history, and epidemiological information. The expected result is Negative.  Fact Sheet for Patients:  EntrepreneurPulse.com.au  Fact Sheet for Healthcare Providers:  IncredibleEmployment.be  This test is no t yet approved or cleared by the [place] FDA and  has been authorized for detection and/or diagnosis of SARS-CoV-2 by FDA under an Emergency Use Authorization (EUA). This EUA will remain  in effect (meaning this test can be used) for the duration of the COVID-19 declaration under Section 564(b)(1) of the Act, 21 U.S.C.section 360bbb-3(b)(1), unless the authorization is terminated  or revoked sooner.       Influenza A by PCR NEGATIVE NEGATIVE Final   Influenza B by PCR NEGATIVE NEGATIVE Final    Comment: (NOTE) The Xpert Xpress  SARS-CoV-2/FLU/RSV plus assay is intended as an aid in the diagnosis of influenza from Nasopharyngeal swab specimens and should not be used as a sole basis for treatment. Nasal washings and aspirates are unacceptable for Xpert Xpress SARS-CoV-2/FLU/RSV testing.  Fact Sheet for Patients: EntrepreneurPulse.com.au  Fact Sheet for Healthcare Providers: IncredibleEmployment.be  This test is not yet approved or cleared by the [place] FDA and has been authorized for detection and/or diagnosis of SARS-CoV-2 by FDA under an Emergency Use Authorization (EUA). This EUA will remain in effect (meaning this test can be used) for the duration of the COVID-19 declaration under Section 564(b)(1) of the Act, 21 U.S.C. section 360bbb-3(b)(1), unless the authorization is terminated or revoked.     Resp Syncytial Virus by PCR NEGATIVE NEGATIVE Final    Comment: (NOTE) Fact Sheet for Patients: EntrepreneurPulse.com.au  Fact Sheet for Healthcare Providers: IncredibleEmployment.be  This test is not yet approved or cleared by the [place] FDA and has been authorized for detection and/or diagnosis of SARS-CoV-2 by FDA under an Emergency Use Authorization (EUA). This EUA will remain in effect (meaning this test can be used) for the duration of the COVID-19 declaration under Section 564(b)(1) of the Act, 21 U.S.C. section 360bbb-3(b)(1), unless the authorization is terminated or revoked.  Performed at [facility], [address] [place], [place] [address]   Gastrointestinal Panel by PCR , Stool     Status: Abnormal   Collection Time: 11/01/22  2:49 AM   Specimen: Urine, In & Out Cath; Stool  Result Value Ref Range Status   Campylobacter species NOT DETECTED NOT DETECTED Final   Plesimonas shigelloides NOT DETECTED NOT DETECTED Final   Salmonella species NOT DETECTED NOT DETECTED Final   Yersinia enterocolitica NOT  DETECTED NOT DETECTED Final   Vibrio species NOT DETECTED NOT DETECTED Final   Vibrio cholerae NOT DETECTED NOT DETECTED Final   Enteroaggregative E coli (EAEC) NOT DETECTED NOT DETECTED Final   Enteropathogenic E coli (EPEC) NOT DETECTED NOT DETECTED Final   Enterotoxigenic E coli (ETEC) NOT DETECTED NOT DETECTED Final   Shiga like toxin producing E coli (STEC) NOT DETECTED NOT DETECTED Final   Shigella/Enteroinvasive E coli (EIEC) NOT DETECTED NOT DETECTED Final   Cryptosporidium NOT DETECTED NOT DETECTED Final   Cyclospora cayetanensis NOT DETECTED NOT DETECTED Final   Entamoeba histolytica NOT DETECTED NOT DETECTED Final   Giardia lamblia NOT DETECTED NOT DETECTED Final   Adenovirus F40/41 DETECTED (A) NOT DETECTED Final   Astrovirus NOT DETECTED NOT DETECTED Final   Norovirus GI/GII NOT  DETECTED NOT DETECTED Final   Rotavirus A NOT DETECTED NOT DETECTED Final   Sapovirus (I, II, IV, and V) NOT DETECTED NOT DETECTED Final    Comment: Performed at [facility], [address] [place], [place] [address]  C Difficile Quick Screen w PCR reflex     Status: None   Collection Time: 11/01/22  2:49 AM   Specimen: Urine, In & Out Cath; Stool  Result Value Ref Range Status   C Diff antigen NEGATIVE NEGATIVE Final   C Diff toxin NEGATIVE NEGATIVE Final   C Diff interpretation No C. difficile detected.  Final    Comment: Performed at [facility], [address], [place], [place] [address]  MRSA Next Gen by PCR, Nasal     Status: None   Collection Time: 11/01/22  3:34 PM   Specimen: Nasal Mucosa; Nasal Swab  Result Value Ref Range Status   MRSA by PCR Next Gen NOT DETECTED NOT DETECTED Final    Comment: (NOTE) The GeneXpert MRSA Assay (FDA approved for NASAL specimens only), is one component of a comprehensive MRSA colonization surveillance program. It is not intended to diagnose MRSA infection nor to guide or monitor treatment for MRSA infections. Test performance is not FDA  approved in patients less than 66 years old. Performed at [facility], [address] [place],  [address]     Radiology Studies: No results found.  Scheduled Meds:  anastrozole  1 mg Oral q AM   Chlorhexidine Gluconate Cloth  6 each Topical Daily   cholestyramine  4 g Oral BID   diphenoxylate-atropine  1 tablet Oral QID   famotidine  10 mg Oral Daily   fluticasone furoate-vilanterol  1 puff Inhalation Once per day on Sun Tue Thu Sat   heparin  5,000 Units Subcutaneous Q8H   melatonin  9 mg Oral QHS   ondansetron (ZOFRAN) IV  4 mg Intravenous Q6H   raloxifene  60 mg Oral Daily   rosuvastatin  20 mg Oral Daily   saccharomyces boulardii  250 mg Oral BID   traZODone  100 mg Oral QHS   Continuous Infusions:  sodium chloride 165 mL/hr at 11/07/22 1236   lactated ringers 1,000 mL (11/07/22 1013)    LOS: 6 days   Time spent: 35 minutes  Lateshia Schmoker, MD [facility]  If 7PM-7AM, please contact night-coverage [email or web address] 11/07/2022, 5:31 PM

## 2022-11-07 NOTE — Progress Notes (Signed)
Patient Slept through the night no complaints of pain. Flexiseal emptied once this shift. Continued to monitor patient.

## 2022-11-07 NOTE — Progress Notes (Signed)
Gastroenterology Progress Note   Referring Provider: No ref. provider found Primary Care Physician:  Claretta Fraise, MD Primary Gastroenterologist:  Elon Alas. Abbey Chatters, DO  Patient ID: Renee Maldonado; [id number]; 08-09-1946    Subjective   Tolerating diet well.  Denies any nausea, vomiting.  Still with complaint of abdominal pain and burning in regards to urination.  Urinalysis still not collected.  Was curious if she was able to eat meat provided with her trace.  Objective   Vital signs in last 24 hours Temp:  [96.8 F (36 C)-98.1 F (36.7 C)] 96.8 F (36 C) (12/22 0545) Pulse Rate:  [84-90] 84 (12/22 0545) Resp:  [16-18] 18 (12/22 0545) BP: (110-134)/(43-64) 125/43 (12/22 0545) SpO2:  [97 %-100 %] 97 % (12/22 0545) Last BM Date : (P) 11/04/22  Physical Exam General:   Alert and oriented, pleasant. NAD Head:  Normocephalic and atraumatic. Eyes:  No icterus, sclera clear. Conjuctiva pink.  Mouth:  Without lesions, mucosa pink and moist.  Abdomen:  Bowel sounds present, soft, non-tender, non-distended. No HSM or hernias noted. No rebound or guarding. No masses appreciated  Extremities:  Without clubbing or edema. Neurologic:  Alert and  oriented x4;  grossly normal neurologically. Psych:  Alert and cooperative. Normal mood and affect.  Intake/Output from previous day: 12/21 0701 - 12/22 0700 In: 7139.3 [P.O.:1560; I.V.:3576.1; IV Piggyback:2003.2] Out: 3800 [Urine:700; Stool:3100] Intake/Output this shift: Total I/O In: -  Out: 500 [Urine:500]  Lab Results  Recent Labs    11/05/22 0349 11/06/22 0330 11/07/22 0332  WBC 13.3* 11.0* 9.3  HGB 12.2 12.4 10.9*  HCT 36.3 37.6 32.3*  PLT 255 259 216   BMET Recent Labs    11/05/22 0349 11/06/22 0330 11/07/22 0332  NA 136 136 135  K 3.6 3.2* 2.3*  CL 115* 113* 117*  CO2 13* 14* 12*  GLUCOSE 130* 116* 102*  BUN 33* 37* 34*  CREATININE 2.71* 3.22* 3.25*  CALCIUM 8.3* 8.1* 7.3*   LFT No results for input(s):  "PROT", "ALBUMIN", "AST", "ALT", "ALKPHOS", "BILITOT", "BILIDIR", "IBILI" in the last 72 hours. PT/INR No results for input(s): "LABPROT", "INR" in the last 72 hours. Hepatitis Panel No results for input(s): "HEPBSAG", "HCVAB", "HEPAIGM", "HEPBIGM" in the last 72 hours. C-Diff PCR Negative  GI pathogen + Adenovirus  Studies/Results CT ABDOMEN PELVIS WO CONTRAST  Result Date: 11/01/2022 CLINICAL DATA:  Patient with a recently diagnosed non-small cell lung cancer right upper lobe, presents to the ED with nonlocalized acute abdomen pain and diarrhea since 10/25/2022. EXAM: CT ABDOMEN AND PELVIS WITHOUT CONTRAST TECHNIQUE: Multidetector CT imaging of the abdomen and pelvis was performed following the standard protocol without IV contrast. RADIATION DOSE REDUCTION: This exam was performed according to the departmental dose-optimization program which includes automated exposure control, adjustment of the mA and/or kV according to patient size and/or use of iterative reconstruction technique. COMPARISON:  Chest CT without contrast 09/09/2022, and previous chest CTs dating back to 03/03/2022, as well as PET-CT 04/30/2022. FINDINGS: Lower chest: Stable 1 cm right lower lobe ground-glass nodule on 4:21. Scattered linear scarring or atelectasis both lower lobes. No acute process. The cardiac size is normal. Small stable pericardial effusion again noted and small hiatal hernia. Hepatobiliary: Capsular nodularity along the left lobe is noted consistent with cirrhosis. There is no dilatation of the hepatic portal main vein. The gallbladder is mildly distended without calcified stones, wall thickening or biliary dilatation. The unenhanced liver disease not show evidence of a mass. There is mild steatosis  with hepatic length measurement 17 cm. Pancreas: There is moderate partial pancreatic atrophy. The unenhanced pancreas otherwise unremarkable without contrast. Spleen: Unremarkable without contrast.  No  splenomegaly. Adrenals/Urinary Tract: There is no adrenal mass. Right kidney demonstrates a homogeneous thin walled 2 cm cyst in the outer mid to lower pole cortex, Hounsfield density of 12.4. There is an 8 mm moderately dense exophytic lesion in the superior pole of this kidney which is too small to characterize. Both unenhanced kidneys are otherwise unremarkable. No follow-up imaging is recommended. For reference see JACR 2018 Feb; 264-273, Management of the Incidental Renal Mass on CT, [employer] 2021; 814-848, Bosniak Classification of Cystic Renal Masses, Version 2019. There is no urinary stone or obstruction. The bladder is unremarkable allowing for the degree of distention. Stomach/Bowel: Small hiatal hernia. The gastric wall is contracted. There is no small bowel dilatation but there is fluid filling of normal caliber small bowel in the mid to lower abdomen with fluid levels compatible with ileus/enteritis There is no small bowel obstruction or inflammatory change. An appendix is not seen in this patient. There is fluid in the colon as well, but again no wall thickening. There are uncomplicated sigmoid diverticula. Vascular/Lymphatic: Aortic atherosclerosis. No enlarged abdominal or pelvic lymph nodes. Reproductive: The uterus is intact. There are small calcified uterine fibroids. No adnexal mass is seen. Other: There is no free air, free hemorrhage, free fluid or incarcerated hernia. There are small inguinal fat hernias. Musculoskeletal: There is lumbar facet hypertrophy greatest at L4-5 where there is grade 1 degenerative anterolisthesis. Osteopenia. No acute or other significant osseous findings. Asymmetrically advanced left hip DJD. IMPRESSION: 1. Fluid in the small bowel and colon consistent with ileus/enteritis. No bowel obstruction or inflammatory change. 2. Diverticulosis without evidence of diverticulitis. 3. Hepatic cirrhosis with mild steatosis. No ascites, portal vein dilatation or  splenomegaly. 4. Aortic atherosclerosis. 5. Stable 1 cm right lower lobe ground-glass nodule. 6. Small hiatal hernia.  Small inguinal fat hernias. 7. Osteopenia and degenerative change. Aortic Atherosclerosis (ICD10-I70.0). Electronically Signed   By: Telford Nab M.D.   On: 11/01/2022 05:52    Assessment  76 y.o. female with a history of anxiety, arthritis, breast cancer, COPD, GERD, HLD, HTN, OA, thyroid cancer who presented to the ED 12/16 for profuse watery diarrhea for 1 week, found to have adenovirus on stool testing.  GI consulted for further management.  Adenovirus: Profuse watery diarrhea now extending almost 2 weeks with intermittent lower abdominal cramping.  No melena or BRBPR.  TCS in April 2022 at [place].  Currently on probiotic, Lomotil (1 tablet 4 times daily), Questran (4 g twice daily).  Has Flexi-Seal in place with documented 3.1 L of stool out for the prior 24 hours.  This is slightly decreased from 3.8 L the prior 24 hours.  There was currently 800 cc with watery bright green stool in the bag on exam this afternoon.  Able to tolerate diet.  Stools seem to be slowly decreasing.  Dysuria/decreased urine output: Patient continues to report difficulty with urinating.  Still with burning on urination.  Creatinine remains elevated same as yesterday.  UA has been ordered but has not been collected.  Cirrhosis: New finding noted on CT scan from this admission.  Will need further outpatient workup with serologies to assess for etiology.  No evidence of thrombocytopenia and she has had normal LFTs.   Plan / Recommendations  Continue soft/low residue/lactose-free diet Continue Questran 4 g twice daily Continue Lomotil 1 tablet 4 times  daily Continue IV fluids Strict I&O's UA per hospitalist    LOS: 6 days    11/07/2022, 12:29 PM   Venetia Night, MSN, FNP-BC, AGACNP-BC [facility]

## 2022-11-08 DIAGNOSIS — E876 Hypokalemia: Secondary | ICD-10-CM | POA: Diagnosis not present

## 2022-11-08 DIAGNOSIS — K529 Noninfective gastroenteritis and colitis, unspecified: Secondary | ICD-10-CM | POA: Diagnosis not present

## 2022-11-08 DIAGNOSIS — R197 Diarrhea, unspecified: Secondary | ICD-10-CM | POA: Diagnosis not present

## 2022-11-08 LAB — CBC WITH DIFFERENTIAL/PLATELET
Abs Immature Granulocytes: 0.1 10*3/uL — ABNORMAL HIGH (ref 0.00–0.07)
Band Neutrophils: 5 %
Basophils Absolute: 0 10*3/uL (ref 0.0–0.1)
Basophils Relative: 0 %
Eosinophils Absolute: 0 10*3/uL (ref 0.0–0.5)
Eosinophils Relative: 0 %
HCT: 33 % — ABNORMAL LOW (ref 36.0–46.0)
Hemoglobin: 11.3 g/dL — ABNORMAL LOW (ref 12.0–15.0)
Lymphocytes Relative: 10 %
Lymphs Abs: 1 10*3/uL (ref 0.7–4.0)
MCH: 27.4 pg (ref 26.0–34.0)
MCHC: 34.2 g/dL (ref 30.0–36.0)
MCV: 79.9 fL — ABNORMAL LOW (ref 80.0–100.0)
Metamyelocytes Relative: 1 %
Monocytes Absolute: 0.4 10*3/uL (ref 0.1–1.0)
Monocytes Relative: 4 %
Neutro Abs: 8.8 10*3/uL — ABNORMAL HIGH (ref 1.7–7.7)
Neutrophils Relative %: 80 %
Platelets: 170 10*3/uL (ref 150–400)
RBC: 4.13 MIL/uL (ref 3.87–5.11)
RDW: 14.6 % (ref 11.5–15.5)
WBC: 10.4 10*3/uL (ref 4.0–10.5)
nRBC: 0 % (ref 0.0–0.2)

## 2022-11-08 LAB — BASIC METABOLIC PANEL
Anion gap: 6 (ref 5–15)
BUN: 25 mg/dL — ABNORMAL HIGH (ref 8–23)
CO2: 11 mmol/L — ABNORMAL LOW (ref 22–32)
Calcium: 7.4 mg/dL — ABNORMAL LOW (ref 8.9–10.3)
Chloride: 121 mmol/L — ABNORMAL HIGH (ref 98–111)
Creatinine, Ser: 2.91 mg/dL — ABNORMAL HIGH (ref 0.44–1.00)
GFR, Estimated: 16 mL/min — ABNORMAL LOW (ref >60)
Glucose, Bld: 114 mg/dL — ABNORMAL HIGH (ref 70–99)
Potassium: 3.3 mmol/L — ABNORMAL LOW (ref 3.5–5.1)
Sodium: 138 mmol/L (ref 135–145)

## 2022-11-08 MED ORDER — POTASSIUM CHLORIDE 20 MEQ PO PACK
40.0000 meq | PACK | Freq: Two times a day (BID) | ORAL | Status: DC
  Administered 2022-11-08 – 2022-11-09 (×3): 40 meq via ORAL
  Filled 2022-11-08 (×3): qty 2

## 2022-11-08 NOTE — Progress Notes (Signed)
Subjective: Diarrhea is slowing down.  Patient notes mild abdominal comfort.  Tolerating diet.  Objective: Vital signs in last 24 hours: Temp:  [98.9 F (37.2 C)-99.2 F (37.3 C)] 99.2 F (37.3 C) (12/23 0439) Pulse Rate:  [95-98] 98 (12/23 0439) Resp:  [16-18] 18 (12/23 0439) BP: (142-157)/(58-61) 157/61 (12/23 0439) SpO2:  [96 %-98 %] 97 % (12/23 0745) Last BM Date : 11/07/22 General:   Alert and oriented, pleasant Head:  Normocephalic and atraumatic. Eyes:  No icterus, sclera clear. Conjuctiva pink.  Abdomen:  Bowel sounds present, soft, non-tender, non-distended. No HSM or hernias noted. No rebound or guarding. No masses appreciated  Msk:  Symmetrical without gross deformities. Normal posture. Extremities:  Without clubbing or edema. Neurologic:  Alert and  oriented x4;  grossly normal neurologically. Skin:  Warm and dry, intact without significant lesions.  Cervical Nodes:  No significant cervical adenopathy. Psych:  Alert and cooperative. Normal mood and affect.  Intake/Output from previous day: 12/22 0701 - 12/23 0700 In: 6465.6 [P.O.:480; I.V.:3982.8; IV Piggyback:2002.9] Out: 1750 [Urine:1050; Stool:700] Intake/Output this shift: Total I/O In: 240 [P.O.:240] Out: -   Lab Results: Recent Labs    11/06/22 0330 11/07/22 0332 11/08/22 0622  WBC 11.0* 9.3 10.4  HGB 12.4 10.9* 11.3*  HCT 37.6 32.3* 33.0*  PLT 259 216 170   BMET Recent Labs    11/07/22 0332 11/07/22 1515 11/08/22 0622  NA 135 136 138  K 2.3* 3.6 3.3*  CL 117* 118* 121*  CO2 12* 12* 11*  GLUCOSE 102* 123* 114*  BUN 34* 31* 25*  CREATININE 3.25* 3.28* 2.91*  CALCIUM 7.3* 7.7* 7.4*   LFT Recent Labs    11/07/22 1515  ALBUMIN 2.6*   PT/INR No results for input(s): "LABPROT", "INR" in the last 72 hours. Hepatitis Panel No results for input(s): "HEPBSAG", "HCVAB", "HEPAIGM", "HEPBIGM" in the last 72 hours.   Studies/Results: No results  found.  Assessment: *Gastroenteritis-adenovirus *Diarrhea due to above *Severe hypokalemia due to above *Dehydration due to above  Plan: Patient's diarrhea appears to be slowing.  Continue on scheduled Lomotil cholestyramine twice daily.  Imodium on top of this as needed.  Soft diet, lactose-free.  Otherwise supportive care.  Outpatient follow-up for cirrhosis management.  Elon Alas. Abbey Chatters, D.O. Gastroenterology and Hepatology [facility]   LOS: 7 days    11/08/2022, 1:41 PM

## 2022-11-08 NOTE — Progress Notes (Signed)
PROGRESS NOTE    Renee Maldonado  DGL:[id number] DOB: 12/18/1945 DOA: 11/01/2022 PCP: Claretta Fraise, MD   Brief Narrative:    Renee Maldonado is a 76 y.o. female with medical history significant for hypertension, dyslipidemia, anxiety disorder, COPD, GERD, and prior thyroid cancer with resection who presented to the ED with ongoing profuse, watery diarrhea that has been ongoing for the last 1 week.  She was admitted for evaluation of diarrhea with associated severe hypokalemia as well as some AKI related to this.  She is noted to have adenovirus gastroenteritis.  She continues to have ongoing significant diarrhea with nausea and vomiting that requires aggressive IV fluid repletion.  Zofran and Lomotil scheduled.  Assessment & Plan:   Principal Problem:   Hypokalemia Active Problems:   Hyperlipidemia   Hypertension   GAD (generalized anxiety disorder)   GERD (gastroesophageal reflux disease)   COPD (chronic obstructive pulmonary disease) (HCC)   Primary osteoarthritis of right knee   Papillary thyroid carcinoma (HCC)   Diarrhea   Gastroenteritis   AKI (acute kidney injury) ([place])  Assessment and Plan:  Severe hypokalemia secondary to adenovirus diarrhea-hypokalemia and diarrhea ongoing -Continue aggressive IV fluid repletion -C. difficile negative -advanced to soft foods diet  -Imodium as needed -Scheduled Lomotil and Zofran 12/19, discussed case with GI with recommendations to continue the same and add probiotics -continue aggressive supportive measures -requested GI consult and appreciate assistance -Pt started on cholestyramine 4 gm BID by GI service  -diarrhea seems to be slowing down -oral potassium replacement ordered  -recheck in AM    AKI on CKD stage IIIa -Appears prerenal in the setting of ongoing diarrhea -Aggressive IV fluid ongoing, increase fluid rate to 150 cc/h -Avoid nephrotoxic agents -worsening creatinine noted -consulted to nephrology on 12/21  -increased  hydration and suspect ATN    Mild hyponatremia-resolved -Likely related to dehydration, monitor on normal saline infusion -Also uses HCTZ which has been held at this time   Dyslipidemia -Continue home medications   Hypertension -Hold home HCTZ and continue other medications   GERD -Continue on PPI   History of COPD with prior tobacco abuse -Continue Breo -Rescue inhalers as needed   Obesity -BMI 35.45     DVT prophylaxis: Heparin Code Status: Full Family Communication: none present 12/22 during rounds Disposition Plan:  Status is: Inpatient Remains inpatient appropriate because: Need for IV medications.   Consultants:  None   Procedures:  None   Antimicrobials:  None  Subjective: Pt says she is happy that she is urinating more frequently now.  Diarrhea seems to be slowing down.    Objective: Vitals:   11/07/22 1336 11/07/22 2055 11/08/22 0439 11/08/22 0745  BP: (!) 163/68 (!) 142/58 (!) 157/61   Pulse: 89 95 98   Resp: 15 16 18    Temp: 97.8 F (36.6 C) 98.9 F (37.2 C) 99.2 F (37.3 C)   TempSrc: Oral     SpO2: 100% 98% 96% 97%  Weight:      Height:        Intake/Output Summary (Last 24 hours) at 11/08/2022 1059 Last data filed at 11/08/2022 0900 Gross per 24 hour  Intake 6705.62 ml  Output 550 ml  Net 6155.62 ml   Filed Weights   11/01/22 0215 11/02/22 0534  Weight: 85.1 kg 74.7 kg    Examination:  General exam: Appears calm and comfortable  Respiratory system: Clear to auscultation. Respiratory effort normal. Cardiovascular system: S1 & S2 heard, RRR.  Gastrointestinal system: Abdomen is  soft, ND/NT, no HSM; BS normal.  Central nervous system: Alert and awake Extremities: No edema Skin: No significant lesions noted Psychiatry: normal pleasant affect.  Data Reviewed: I have personally reviewed following labs and imaging studies  CBC: Recent Labs  Lab 11/04/22 0333 11/05/22 0349 11/06/22 0330 11/07/22 0332 11/08/22 0622  WBC  8.1 13.3* 11.0* 9.3 10.4  NEUTROABS  --   --  7.0 6.4 PENDING  HGB 11.7* 12.2 12.4 10.9* 11.3*  HCT 35.1* 36.3 37.6 32.3* 33.0*  MCV 81.4 80.7 80.5 79.0* 79.9*  PLT 250 255 259 216 811   Basic Metabolic Panel: Recent Labs  Lab 11/02/22 0526 11/03/22 0421 11/04/22 0333 11/05/22 0349 11/06/22 0330 11/07/22 0332 11/07/22 1515 11/08/22 0622  NA 140 138 136 136 136 135 136 138  K 3.2* 4.1 3.5 3.6 3.2* 2.3* 3.6 3.3*  CL 112* 112* 115* 115* 113* 117* 118* 121*  CO2 19* 18* 13* 13* 14* 12* 12* 11*  GLUCOSE 138* 143* 122* 130* 116* 102* 123* 114*  BUN 25* 30* 31* 33* 37* 34* 31* 25*  CREATININE 1.75* 1.97* 2.26* 2.71* 3.22* 3.25* 3.28* 2.91*  CALCIUM 8.3* 8.5* 8.2* 8.3* 8.1* 7.3* 7.7* 7.4*  MG 1.7 1.6* 2.3 2.1 2.1  --   --   --   PHOS  --   --   --   --   --   --  1.8*  --    GFR: Estimated Creatinine Clearance: 15 mL/min (A) (by C-G formula based on SCr of 2.91 mg/dL (H)). Liver Function Tests: Recent Labs  Lab 11/02/22 0526 11/07/22 1515  AST 16  --   ALT 16  --   ALKPHOS 61  --   BILITOT 0.7  --   PROT 6.7  --   ALBUMIN 3.8 2.6*   No results for input(s): "LIPASE", "AMYLASE" in the last 168 hours.  No results for input(s): "AMMONIA" in the last 168 hours. Coagulation Profile: No results for input(s): "INR", "PROTIME" in the last 168 hours. Cardiac Enzymes: No results for input(s): "CKTOTAL", "CKMB", "CKMBINDEX", "TROPONINI" in the last 168 hours. BNP (last 3 results) No results for input(s): "PROBNP" in the last 8760 hours. HbA1C: No results for input(s): "HGBA1C" in the last 72 hours. CBG: No results for input(s): "GLUCAP" in the last 168 hours.  Lipid Profile: No results for input(s): "CHOL", "HDL", "LDLCALC", "TRIG", "CHOLHDL", "LDLDIRECT" in the last 72 hours. Thyroid Function Tests: No results for input(s): "TSH", "T4TOTAL", "FREET4", "T3FREE", "THYROIDAB" in the last 72 hours. Anemia Panel: No results for input(s): "VITAMINB12", "FOLATE", "FERRITIN",  "TIBC", "IRON", "RETICCTPCT" in the last 72 hours. Sepsis Labs: No results for input(s): "PROCALCITON", "LATICACIDVEN" in the last 168 hours.  Recent Results (from the past 240 hour(s))  Resp panel by RT-PCR (RSV, Flu A&B, Covid) Anterior Nasal Swab     Status: None   Collection Time: 11/01/22  2:39 AM   Specimen: Anterior Nasal Swab  Result Value Ref Range Status   SARS Coronavirus 2 by RT PCR NEGATIVE NEGATIVE Final    Comment: (NOTE) SARS-CoV-2 target nucleic acids are NOT DETECTED.  The SARS-CoV-2 RNA is generally detectable in upper respiratory specimens during the acute phase of infection. The lowest concentration of SARS-CoV-2 viral copies this assay can detect is 138 copies/mL. A negative result does not preclude SARS-Cov-2 infection and should not be used as the sole basis for treatment or other patient management decisions. A negative result may occur with  improper specimen collection/handling, submission of specimen  other than nasopharyngeal swab, presence of viral mutation(s) within the areas targeted by this assay, and inadequate number of viral copies(<138 copies/mL). A negative result must be combined with clinical observations, patient history, and epidemiological information. The expected result is Negative.  Fact Sheet for Patients:  EntrepreneurPulse.com.au  Fact Sheet for Healthcare Providers:  IncredibleEmployment.be  This test is no t yet approved or cleared by the [place] FDA and  has been authorized for detection and/or diagnosis of SARS-CoV-2 by FDA under an Emergency Use Authorization (EUA). This EUA will remain  in effect (meaning this test can be used) for the duration of the COVID-19 declaration under Section 564(b)(1) of the Act, 21 U.S.C.section 360bbb-3(b)(1), unless the authorization is terminated  or revoked sooner.       Influenza A by PCR NEGATIVE NEGATIVE Final   Influenza B by PCR NEGATIVE  NEGATIVE Final    Comment: (NOTE) The Xpert Xpress SARS-CoV-2/FLU/RSV plus assay is intended as an aid in the diagnosis of influenza from Nasopharyngeal swab specimens and should not be used as a sole basis for treatment. Nasal washings and aspirates are unacceptable for Xpert Xpress SARS-CoV-2/FLU/RSV testing.  Fact Sheet for Patients: EntrepreneurPulse.com.au  Fact Sheet for Healthcare Providers: IncredibleEmployment.be  This test is not yet approved or cleared by the [place] FDA and has been authorized for detection and/or diagnosis of SARS-CoV-2 by FDA under an Emergency Use Authorization (EUA). This EUA will remain in effect (meaning this test can be used) for the duration of the COVID-19 declaration under Section 564(b)(1) of the Act, 21 U.S.C. section 360bbb-3(b)(1), unless the authorization is terminated or revoked.     Resp Syncytial Virus by PCR NEGATIVE NEGATIVE Final    Comment: (NOTE) Fact Sheet for Patients: EntrepreneurPulse.com.au  Fact Sheet for Healthcare Providers: IncredibleEmployment.be  This test is not yet approved or cleared by the [place] FDA and has been authorized for detection and/or diagnosis of SARS-CoV-2 by FDA under an Emergency Use Authorization (EUA). This EUA will remain in effect (meaning this test can be used) for the duration of the COVID-19 declaration under Section 564(b)(1) of the Act, 21 U.S.C. section 360bbb-3(b)(1), unless the authorization is terminated or revoked.  Performed at [facility], [address], [place], [place] [address]   Gastrointestinal Panel by PCR , Stool     Status: Abnormal   Collection Time: 11/01/22  2:49 AM   Specimen: Urine, In & Out Cath; Stool  Result Value Ref Range Status   Campylobacter species NOT DETECTED NOT DETECTED Final   Plesimonas shigelloides NOT DETECTED NOT DETECTED Final   Salmonella species NOT DETECTED  NOT DETECTED Final   Yersinia enterocolitica NOT DETECTED NOT DETECTED Final   Vibrio species NOT DETECTED NOT DETECTED Final   Vibrio cholerae NOT DETECTED NOT DETECTED Final   Enteroaggregative E coli (EAEC) NOT DETECTED NOT DETECTED Final   Enteropathogenic E coli (EPEC) NOT DETECTED NOT DETECTED Final   Enterotoxigenic E coli (ETEC) NOT DETECTED NOT DETECTED Final   Shiga like toxin producing E coli (STEC) NOT DETECTED NOT DETECTED Final   Shigella/Enteroinvasive E coli (EIEC) NOT DETECTED NOT DETECTED Final   Cryptosporidium NOT DETECTED NOT DETECTED Final   Cyclospora cayetanensis NOT DETECTED NOT DETECTED Final   Entamoeba histolytica NOT DETECTED NOT DETECTED Final   Giardia lamblia NOT DETECTED NOT DETECTED Final   Adenovirus F40/41 DETECTED (A) NOT DETECTED Final   Astrovirus NOT DETECTED NOT DETECTED Final   Norovirus GI/GII NOT DETECTED NOT DETECTED Final  Rotavirus A NOT DETECTED NOT DETECTED Final   Sapovirus (I, II, IV, and V) NOT DETECTED NOT DETECTED Final    Comment: Performed at [facility], [address] [place], [place] [address]  C Difficile Quick Screen w PCR reflex     Status: None   Collection Time: 11/01/22  2:49 AM   Specimen: Urine, In & Out Cath; Stool  Result Value Ref Range Status   C Diff antigen NEGATIVE NEGATIVE Final   C Diff toxin NEGATIVE NEGATIVE Final   C Diff interpretation No C. difficile detected.  Final    Comment: Performed at [facility], [address], [place], [place] [address]  MRSA Next Gen by PCR, Nasal     Status: None   Collection Time: 11/01/22  3:34 PM   Specimen: Nasal Mucosa; Nasal Swab  Result Value Ref Range Status   MRSA by PCR Next Gen NOT DETECTED NOT DETECTED Final    Comment: (NOTE) The GeneXpert MRSA Assay (FDA approved for NASAL specimens only), is one component of a comprehensive MRSA colonization surveillance program. It is not intended to diagnose MRSA infection nor to guide or monitor treatment for  MRSA infections. Test performance is not FDA approved in patients less than 28 years old. Performed at [facility], [address] [place], [place] [address]     Radiology Studies: No results found.  Scheduled Meds:  anastrozole  1 mg Oral q AM   Chlorhexidine Gluconate Cloth  6 each Topical Daily   cholestyramine  4 g Oral BID   diphenoxylate-atropine  1 tablet Oral QID   famotidine  10 mg Oral Daily   fluticasone furoate-vilanterol  1 puff Inhalation Once per day on Sun Tue Thu Sat   heparin  5,000 Units Subcutaneous Q8H   melatonin  9 mg Oral QHS   ondansetron (ZOFRAN) IV  4 mg Intravenous Q6H   potassium chloride  40 mEq Oral BID   raloxifene  60 mg Oral Daily   rosuvastatin  20 mg Oral Daily   saccharomyces boulardii  250 mg Oral BID   traZODone  100 mg Oral QHS   Continuous Infusions:  sodium chloride 165 mL/hr at 11/08/22 0615    LOS: 7 days   Time spent: 35 minutes  Michaeline Eckersley [name], MD [facility]  If 7PM-7AM, please contact night-coverage [email or web address] 11/08/2022, 10:59 AM

## 2022-11-08 NOTE — Plan of Care (Signed)
  Problem: Education: Goal: Knowledge of General Education information will improve Description Including pain rating scale, medication(s)/side effects and non-pharmacologic comfort measures Outcome: Progressing   Problem: Health Behavior/Discharge Planning: Goal: Ability to manage health-related needs will improve Outcome: Progressing   

## 2022-11-09 DIAGNOSIS — E876 Hypokalemia: Secondary | ICD-10-CM | POA: Diagnosis not present

## 2022-11-09 DIAGNOSIS — K529 Noninfective gastroenteritis and colitis, unspecified: Secondary | ICD-10-CM | POA: Diagnosis not present

## 2022-11-09 DIAGNOSIS — I10 Essential (primary) hypertension: Secondary | ICD-10-CM | POA: Diagnosis not present

## 2022-11-09 DIAGNOSIS — K219 Gastro-esophageal reflux disease without esophagitis: Secondary | ICD-10-CM | POA: Diagnosis not present

## 2022-11-09 DIAGNOSIS — N179 Acute kidney failure, unspecified: Secondary | ICD-10-CM | POA: Diagnosis not present

## 2022-11-09 DIAGNOSIS — R197 Diarrhea, unspecified: Secondary | ICD-10-CM | POA: Diagnosis not present

## 2022-11-09 LAB — BASIC METABOLIC PANEL
Anion gap: 6 (ref 5–15)
BUN: 23 mg/dL (ref 8–23)
CO2: 10 mmol/L — ABNORMAL LOW (ref 22–32)
Calcium: 7.2 mg/dL — ABNORMAL LOW (ref 8.9–10.3)
Chloride: 118 mmol/L — ABNORMAL HIGH (ref 98–111)
Creatinine, Ser: 2.89 mg/dL — ABNORMAL HIGH (ref 0.44–1.00)
GFR, Estimated: 16 mL/min — ABNORMAL LOW (ref >60)
Glucose, Bld: 127 mg/dL — ABNORMAL HIGH (ref 70–99)
Potassium: 3.7 mmol/L (ref 3.5–5.1)
Sodium: 134 mmol/L — ABNORMAL LOW (ref 135–145)

## 2022-11-09 LAB — MAGNESIUM: Magnesium: 1.3 mg/dL — ABNORMAL LOW (ref 1.7–2.4)

## 2022-11-09 MED ORDER — POTASSIUM CHLORIDE 20 MEQ PO PACK
40.0000 meq | PACK | Freq: Two times a day (BID) | ORAL | Status: DC
  Administered 2022-11-09 – 2022-11-12 (×6): 40 meq via ORAL
  Filled 2022-11-09 (×6): qty 2

## 2022-11-09 MED ORDER — MAGNESIUM SULFATE 4 GM/100ML IV SOLN
4.0000 g | Freq: Once | INTRAVENOUS | Status: AC
  Administered 2022-11-09: 4 g via INTRAVENOUS
  Filled 2022-11-09: qty 100

## 2022-11-09 MED ORDER — POTASSIUM CHLORIDE 20 MEQ PO PACK: 40.0000 meq | PACK | Freq: Three times a day (TID) | ORAL | Status: DC

## 2022-11-09 MED ORDER — STERILE WATER FOR INJECTION IV SOLN
INTRAVENOUS | Status: DC
  Filled 2022-11-09 (×12): qty 1000

## 2022-11-09 NOTE — Progress Notes (Signed)
Pt has had good day. No new stool noted in flexiseal today. Pt has been up in chair all day per her request. Tolerated oral food, fluids and meds without difficulty. No c/o pain.  When pt assisted back to bed from recliner this afternoon, flexiseal was expelled from rectum by pt, along with small amount of type 6 brown stool. Pt cleaned and flexiseal not reinserted at this time due to no further stool noted. Advised pt that if stool amount or frequency increased then we would reinsert as needed. Pt agreeable.

## 2022-11-09 NOTE — Progress Notes (Signed)
PROGRESS NOTE    Renee Maldonado  GHW:[id number] DOB: 1946/05/22 DOA: 11/01/2022 PCP: Claretta Fraise, MD   Brief Narrative:    Renee Maldonado is a 76 y.o. female with medical history significant for hypertension, dyslipidemia, anxiety disorder, COPD, GERD, and prior thyroid cancer with resection who presented to the ED with ongoing profuse, watery diarrhea that has been ongoing for the last 1 week.  She was admitted for evaluation of diarrhea with associated severe hypokalemia as well as some AKI related to this.  She is noted to have adenovirus gastroenteritis.  She continues to have ongoing significant diarrhea with nausea and vomiting that requires aggressive IV fluid repletion.  Zofran and Lomotil scheduled.  Assessment & Plan:   Principal Problem:   Hypokalemia Active Problems:   Hyperlipidemia   Hypertension   GAD (generalized anxiety disorder)   GERD (gastroesophageal reflux disease)   COPD (chronic obstructive pulmonary disease) (HCC)   Primary osteoarthritis of right knee   Papillary thyroid carcinoma (HCC)   Diarrhea   Gastroenteritis   AKI (acute kidney injury) ([place])  Assessment and Plan:  Severe hypokalemia secondary to adenovirus diarrhea-hypokalemia and diarrhea ongoing -Continue aggressive IV fluid repletion -C. difficile negative -advanced to soft foods diet  -Imodium as needed -Scheduled Lomotil and Zofran 12/19, discussed case with GI with recommendations to continue the same and add probiotics -continue aggressive supportive measures -requested GI consult and appreciate assistance -Pt started on cholestyramine 4 gm BID by GI service  -diarrhea seems to be slowing down -oral potassium replacement ordered  -recheck in AM    AKI on CKD stage IIIa -Appears prerenal in the setting of ongoing diarrhea -Aggressive IV fluid ongoing, increase fluid rate to 150 cc/h -Avoid nephrotoxic agents -worsening creatinine noted -consulted to nephrology on 12/21  -increased  hydration and suspect ATN  -slowly improving   Mild hyponatremia-resolved -Likely related to dehydration, monitor on normal saline infusion -Also uses HCTZ which has been held at this time   Hypomagnesemia -IV Mg ordered, recheck in AM   Dyslipidemia -Continue home medications   Hypertension -Hold home HCTZ and continue other medications   GERD -Continue on PPI   History of COPD with prior tobacco abuse -Continue Breo -Rescue inhalers as needed   Obesity -BMI 35.45     DVT prophylaxis: Heparin Code Status: Full Family Communication: none present 12/22 during rounds Disposition Plan:  Status is: Inpatient Remains inpatient appropriate because: Need for IV medications.   Consultants:  None   Procedures:  None   Antimicrobials:  None  Subjective: Pt reports poor appetite.     Objective: Vitals:   11/08/22 2110 11/08/22 2333 11/09/22 0431 11/09/22 0756  BP: (!) 171/141 (!) 156/73 (!) 161/77   Pulse: (!) 107 (!) 108 100   Resp: 18 18 18    Temp: (!) 100.6 F (38.1 C) 99.7 F (37.6 C) 99.2 F (37.3 C)   TempSrc:  Oral    SpO2: 99% 97% 96% 98%  Weight:      Height:        Intake/Output Summary (Last 24 hours) at 11/09/2022 1537 Last data filed at 11/09/2022 1300 Gross per 24 hour  Intake 2843.94 ml  Output 2200 ml  Net 643.94 ml   Filed Weights   11/01/22 0215 11/02/22 0534  Weight: 85.1 kg 74.7 kg    Examination:  General exam: Appears calm and comfortable  Respiratory system: Clear to auscultation. Respiratory effort normal. Cardiovascular system: S1 & S2 heard, RRR.  Gastrointestinal system: Abdomen is  soft, ND/NT, no HSM; BS normal.  Central nervous system: Alert and awake Extremities: No edema Skin: No significant lesions noted Psychiatry: normal pleasant affect.  Data Reviewed: I have personally reviewed following labs and imaging studies  CBC: Recent Labs  Lab 11/04/22 0333 11/05/22 0349 11/06/22 0330 11/07/22 0332  11/08/22 0622  WBC 8.1 13.3* 11.0* 9.3 10.4  NEUTROABS  --   --  7.0 6.4 8.8*  HGB 11.7* 12.2 12.4 10.9* 11.3*  HCT 35.1* 36.3 37.6 32.3* 33.0*  MCV 81.4 80.7 80.5 79.0* 79.9*  PLT 250 255 259 216 595   Basic Metabolic Panel: Recent Labs  Lab 11/03/22 0421 11/04/22 0333 11/05/22 0349 11/06/22 0330 11/07/22 0332 11/07/22 1515 11/08/22 0622 11/09/22 0414  NA 138 136 136 136 135 136 138 134*  K 4.1 3.5 3.6 3.2* 2.3* 3.6 3.3* 3.7  CL 112* 115* 115* 113* 117* 118* 121* 118*  CO2 18* 13* 13* 14* 12* 12* 11* 10*  GLUCOSE 143* 122* 130* 116* 102* 123* 114* 127*  BUN 30* 31* 33* 37* 34* 31* 25* 23  CREATININE 1.97* 2.26* 2.71* 3.22* 3.25* 3.28* 2.91* 2.89*  CALCIUM 8.5* 8.2* 8.3* 8.1* 7.3* 7.7* 7.4* 7.2*  MG 1.6* 2.3 2.1 2.1  --   --   --  1.3*  PHOS  --   --   --   --   --  1.8*  --   --    GFR: Estimated Creatinine Clearance: 15.1 mL/min (A) (by C-G formula based on SCr of 2.89 mg/dL (H)). Liver Function Tests: Recent Labs  Lab 11/07/22 1515  ALBUMIN 2.6*   No results for input(s): "LIPASE", "AMYLASE" in the last 168 hours.  No results for input(s): "AMMONIA" in the last 168 hours. Coagulation Profile: No results for input(s): "INR", "PROTIME" in the last 168 hours. Cardiac Enzymes: No results for input(s): "CKTOTAL", "CKMB", "CKMBINDEX", "TROPONINI" in the last 168 hours. BNP (last 3 results) No results for input(s): "PROBNP" in the last 8760 hours. HbA1C: No results for input(s): "HGBA1C" in the last 72 hours. CBG: No results for input(s): "GLUCAP" in the last 168 hours.  Lipid Profile: No results for input(s): "CHOL", "HDL", "LDLCALC", "TRIG", "CHOLHDL", "LDLDIRECT" in the last 72 hours. Thyroid Function Tests: No results for input(s): "TSH", "T4TOTAL", "FREET4", "T3FREE", "THYROIDAB" in the last 72 hours. Anemia Panel: No results for input(s): "VITAMINB12", "FOLATE", "FERRITIN", "TIBC", "IRON", "RETICCTPCT" in the last 72 hours. Sepsis Labs: No results for  input(s): "PROCALCITON", "LATICACIDVEN" in the last 168 hours.  Recent Results (from the past 240 hour(s))  Resp panel by RT-PCR (RSV, Flu A&B, Covid) Anterior Nasal Swab     Status: None   Collection Time: 11/01/22  2:39 AM   Specimen: Anterior Nasal Swab  Result Value Ref Range Status   SARS Coronavirus 2 by RT PCR NEGATIVE NEGATIVE Final    Comment: (NOTE) SARS-CoV-2 target nucleic acids are NOT DETECTED.  The SARS-CoV-2 RNA is generally detectable in upper respiratory specimens during the acute phase of infection. The lowest concentration of SARS-CoV-2 viral copies this assay can detect is 138 copies/mL. A negative result does not preclude SARS-Cov-2 infection and should not be used as the sole basis for treatment or other patient management decisions. A negative result may occur with  improper specimen collection/handling, submission of specimen other than nasopharyngeal swab, presence of viral mutation(s) within the areas targeted by this assay, and inadequate number of viral copies(<138 copies/mL). A negative result must be combined with clinical observations, patient history,  and epidemiological information. The expected result is Negative.  Fact Sheet for Patients:  EntrepreneurPulse.com.au  Fact Sheet for Healthcare Providers:  IncredibleEmployment.be  This test is no t yet approved or cleared by the [place] FDA and  has been authorized for detection and/or diagnosis of SARS-CoV-2 by FDA under an Emergency Use Authorization (EUA). This EUA will remain  in effect (meaning this test can be used) for the duration of the COVID-19 declaration under Section 564(b)(1) of the Act, 21 U.S.C.section 360bbb-3(b)(1), unless the authorization is terminated  or revoked sooner.       Influenza A by PCR NEGATIVE NEGATIVE Final   Influenza B by PCR NEGATIVE NEGATIVE Final    Comment: (NOTE) The Xpert Xpress SARS-CoV-2/FLU/RSV plus assay is  intended as an aid in the diagnosis of influenza from Nasopharyngeal swab specimens and should not be used as a sole basis for treatment. Nasal washings and aspirates are unacceptable for Xpert Xpress SARS-CoV-2/FLU/RSV testing.  Fact Sheet for Patients: EntrepreneurPulse.com.au  Fact Sheet for Healthcare Providers: IncredibleEmployment.be  This test is not yet approved or cleared by the [place] FDA and has been authorized for detection and/or diagnosis of SARS-CoV-2 by FDA under an Emergency Use Authorization (EUA). This EUA will remain in effect (meaning this test can be used) for the duration of the COVID-19 declaration under Section 564(b)(1) of the Act, 21 U.S.C. section 360bbb-3(b)(1), unless the authorization is terminated or revoked.     Resp Syncytial Virus by PCR NEGATIVE NEGATIVE Final    Comment: (NOTE) Fact Sheet for Patients: EntrepreneurPulse.com.au  Fact Sheet for Healthcare Providers: IncredibleEmployment.be  This test is not yet approved or cleared by the [place] FDA and has been authorized for detection and/or diagnosis of SARS-CoV-2 by FDA under an Emergency Use Authorization (EUA). This EUA will remain in effect (meaning this test can be used) for the duration of the COVID-19 declaration under Section 564(b)(1) of the Act, 21 U.S.C. section 360bbb-3(b)(1), unless the authorization is terminated or revoked.  Performed at [facility], [address], [place], [place] [address]   Gastrointestinal Panel by PCR , Stool     Status: Abnormal   Collection Time: 11/01/22  2:49 AM   Specimen: Urine, In & Out Cath; Stool  Result Value Ref Range Status   Campylobacter species NOT DETECTED NOT DETECTED Final   Plesimonas shigelloides NOT DETECTED NOT DETECTED Final   Salmonella species NOT DETECTED NOT DETECTED Final   Yersinia enterocolitica NOT DETECTED NOT DETECTED Final    Vibrio species NOT DETECTED NOT DETECTED Final   Vibrio cholerae NOT DETECTED NOT DETECTED Final   Enteroaggregative E coli (EAEC) NOT DETECTED NOT DETECTED Final   Enteropathogenic E coli (EPEC) NOT DETECTED NOT DETECTED Final   Enterotoxigenic E coli (ETEC) NOT DETECTED NOT DETECTED Final   Shiga like toxin producing E coli (STEC) NOT DETECTED NOT DETECTED Final   Shigella/Enteroinvasive E coli (EIEC) NOT DETECTED NOT DETECTED Final   Cryptosporidium NOT DETECTED NOT DETECTED Final   Cyclospora cayetanensis NOT DETECTED NOT DETECTED Final   Entamoeba histolytica NOT DETECTED NOT DETECTED Final   Giardia lamblia NOT DETECTED NOT DETECTED Final   Adenovirus F40/41 DETECTED (A) NOT DETECTED Final   Astrovirus NOT DETECTED NOT DETECTED Final   Norovirus GI/GII NOT DETECTED NOT DETECTED Final   Rotavirus A NOT DETECTED NOT DETECTED Final   Sapovirus (I, II, IV, and V) NOT DETECTED NOT DETECTED Final    Comment: Performed at [facility], [address] [place], [place] [address]  C Difficile Quick Screen w PCR reflex     Status: None   Collection Time: 11/01/22  2:49 AM   Specimen: Urine, In & Out Cath; Stool  Result Value Ref Range Status   C Diff antigen NEGATIVE NEGATIVE Final   C Diff toxin NEGATIVE NEGATIVE Final   C Diff interpretation No C. difficile detected.  Final    Comment: Performed at [facility], [address] [place], [place] [address]  MRSA Next Gen by PCR, Nasal     Status: None   Collection Time: 11/01/22  3:34 PM   Specimen: Nasal Mucosa; Nasal Swab  Result Value Ref Range Status   MRSA by PCR Next Gen NOT DETECTED NOT DETECTED Final    Comment: (NOTE) The GeneXpert MRSA Assay (FDA approved for NASAL specimens only), is one component of a comprehensive MRSA colonization surveillance program. It is not intended to diagnose MRSA infection nor to guide or monitor treatment for MRSA infections. Test performance is not FDA approved in patients less than 21  years old. Performed at [facility], [address], [place], [place] [address]     Radiology Studies: No results found.  Scheduled Meds:  anastrozole  1 mg Oral q AM   cholestyramine  4 g Oral BID   diphenoxylate-atropine  1 tablet Oral QID   famotidine  10 mg Oral Daily   fluticasone furoate-vilanterol  1 puff Inhalation Once per day on Sun Tue Thu Sat   heparin  5,000 Units Subcutaneous Q8H   melatonin  9 mg Oral QHS   ondansetron (ZOFRAN) IV  4 mg Intravenous Q6H   potassium chloride  40 mEq Oral BID   raloxifene  60 mg Oral Daily   rosuvastatin  20 mg Oral Daily   saccharomyces boulardii  250 mg Oral BID   traZODone  100 mg Oral QHS   Continuous Infusions:  sodium bicarbonate 150 mEq in sterile water 1,150 mL infusion      LOS: 8 days   Time spent: 35 minutes  Daquavion Catala [name], MD [facility]  If 7PM-7AM, please contact night-coverage [email or web address] 11/09/2022, 3:37 PM

## 2022-11-09 NOTE — Progress Notes (Signed)
Subjective: Diarrhea is slowing down.  Patient notes mild abdominal comfort.  Tolerating diet but does tell me her appetite is poor.  Objective: Vital signs in last 24 hours: Temp:  [99.2 F (37.3 C)-100.6 F (38.1 C)] 99.2 F (37.3 C) (12/24 0431) Pulse Rate:  [100-108] 100 (12/24 0431) Resp:  [18] 18 (12/24 0431) BP: (156-171)/(73-141) 161/77 (12/24 0431) SpO2:  [96 %-99 %] 98 % (12/24 0756) Last BM Date : 11/08/22 (flexiseal) General:   Alert and oriented, pleasant Head:  Normocephalic and atraumatic. Eyes:  No icterus, sclera clear. Conjuctiva pink.  Abdomen:  Bowel sounds present, soft, non-tender, non-distended. No HSM or hernias noted. No rebound or guarding. No masses appreciated  Msk:  Symmetrical without gross deformities. Normal posture. Extremities:  Without clubbing or edema. Neurologic:  Alert and  oriented x4;  grossly normal neurologically. Skin:  Warm and dry, intact without significant lesions.  Cervical Nodes:  No significant cervical adenopathy. Psych:  Alert and cooperative. Normal mood and affect.  Intake/Output from previous day: 12/23 0701 - 12/24 0700 In: 2843.9 [P.O.:960; I.V.:1883.9] Out: 2200 [Urine:1500; Stool:700] Intake/Output this shift: No intake/output data recorded.  Lab Results: Recent Labs    11/07/22 0332 11/08/22 0622  WBC 9.3 10.4  HGB 10.9* 11.3*  HCT 32.3* 33.0*  PLT 216 170    BMET Recent Labs    11/07/22 1515 11/08/22 0622 11/09/22 0414  NA 136 138 134*  K 3.6 3.3* 3.7  CL 118* 121* 118*  CO2 12* 11* 10*  GLUCOSE 123* 114* 127*  BUN 31* 25* 23  CREATININE 3.28* 2.91* 2.89*  CALCIUM 7.7* 7.4* 7.2*    LFT Recent Labs    11/07/22 1515  ALBUMIN 2.6*    PT/INR No results for input(s): "LABPROT", "INR" in the last 72 hours. Hepatitis Panel No results for input(s): "HEPBSAG", "HCVAB", "HEPAIGM", "HEPBIGM" in the last 72 hours.   Studies/Results: No results  found.  Assessment: *Gastroenteritis-adenovirus *Diarrhea due to above *Severe hypokalemia due to above *Dehydration due to above  Plan: Patient's diarrhea appears to be slowing.  Continue on scheduled Lomotil cholestyramine twice daily.  Imodium on top of this as needed.  Soft diet, lactose-free.  Otherwise supportive care.  Outpatient follow-up for cirrhosis management.  Elon Alas. Abbey Chatters, D.O. Gastroenterology and Hepatology [facility]   LOS: 8 days    11/09/2022, 12:00 PM

## 2022-11-09 NOTE — Progress Notes (Signed)
Patient ID: Renee Maldonado, female   DOB: 1945-11-20, 76 y.o.   MRN: [id number] S: Labs and notes reviewed remotely.  See assessment and plan below for changes.  O:BP (!) 161/77 (BP Location: Right Arm)   Pulse 100   Temp 99.2 F (37.3 C)   Resp 18   Ht 5\' 1"  (1.549 m)   Wt 74.7 kg   SpO2 98%   BMI 31.12 kg/m   Intake/Output Summary (Last 24 hours) at 11/09/2022 1208 Last data filed at 11/09/2022 0500 Gross per 24 hour  Intake 2603.94 ml  Output 2200 ml  Net 403.94 ml   Intake/Output: I/O last 3 completed shifts: In: 8829.6 [P.O.:960; I.V.:5866.7; IV Piggyback:2002.9] Out: 2750 [Urine:2050; Stool:700]  Intake/Output this shift:  No intake/output data recorded. Weight change:    Recent Labs  Lab 11/04/22 0333 11/05/22 0349 11/06/22 0330 11/07/22 0332 11/07/22 1515 11/08/22 0622 11/09/22 0414  NA 136 136 136 135 136 138 134*  K 3.5 3.6 3.2* 2.3* 3.6 3.3* 3.7  CL 115* 115* 113* 117* 118* 121* 118*  CO2 13* 13* 14* 12* 12* 11* 10*  GLUCOSE 122* 130* 116* 102* 123* 114* 127*  BUN 31* 33* 37* 34* 31* 25* 23  CREATININE 2.26* 2.71* 3.22* 3.25* 3.28* 2.91* 2.89*  ALBUMIN  --   --   --   --  2.6*  --   --   CALCIUM 8.2* 8.3* 8.1* 7.3* 7.7* 7.4* 7.2*  PHOS  --   --   --   --  1.8*  --   --    Liver Function Tests: Recent Labs  Lab 11/07/22 1515  ALBUMIN 2.6*   No results for input(s): "LIPASE", "AMYLASE" in the last 168 hours. No results for input(s): "AMMONIA" in the last 168 hours. CBC: Recent Labs  Lab 11/04/22 0333 11/05/22 0349 11/06/22 0330 11/07/22 0332 11/08/22 0622  WBC 8.1 13.3* 11.0* 9.3 10.4  NEUTROABS  --   --  7.0 6.4 8.8*  HGB 11.7* 12.2 12.4 10.9* 11.3*  HCT 35.1* 36.3 37.6 32.3* 33.0*  MCV 81.4 80.7 80.5 79.0* 79.9*  PLT 250 255 259 216 170   Cardiac Enzymes: No results for input(s): "CKTOTAL", "CKMB", "CKMBINDEX", "TROPONINI" in the last 168 hours. CBG: No results for input(s): "GLUCAP" in the last 168 hours.  Iron Studies: No results  for input(s): "IRON", "TIBC", "TRANSFERRIN", "FERRITIN" in the last 72 hours. Studies/Results: No results found.  anastrozole  1 mg Oral q AM   cholestyramine  4 g Oral BID   diphenoxylate-atropine  1 tablet Oral QID   famotidine  10 mg Oral Daily   fluticasone furoate-vilanterol  1 puff Inhalation Once per day on Sun Tue Thu Sat   heparin  5,000 Units Subcutaneous Q8H   melatonin  9 mg Oral QHS   ondansetron (ZOFRAN) IV  4 mg Intravenous Q6H   potassium chloride  40 mEq Oral BID   raloxifene  60 mg Oral Daily   rosuvastatin  20 mg Oral Daily   saccharomyces boulardii  250 mg Oral BID   traZODone  100 mg Oral QHS    BMET    Component Value Date/Time   NA 134 (L) 11/09/2022 0414   NA 143 07/14/2022 1006   K 3.7 11/09/2022 0414   CL 118 (H) 11/09/2022 0414   CO2 10 (L) 11/09/2022 0414   GLUCOSE 127 (H) 11/09/2022 0414   BUN 23 11/09/2022 0414   BUN 18 07/14/2022 1006   CREATININE 2.89 (H) 11/09/2022  0414   CALCIUM 7.2 (L) 11/09/2022 0414   GFRNONAA 16 (L) 11/09/2022 0414   GFRAA 50 (L) 12/13/2020 0913   CBC    Component Value Date/Time   WBC 10.4 11/08/2022 0622   RBC 4.13 11/08/2022 0622   HGB 11.3 (L) 11/08/2022 0622   HGB 12.0 07/14/2022 1006   HCT 33.0 (L) 11/08/2022 0622   HCT 36.8 07/14/2022 1006   PLT 170 11/08/2022 0622   PLT 274 07/14/2022 1006   MCV 79.9 (L) 11/08/2022 0622   MCV 83 07/14/2022 1006   MCH 27.4 11/08/2022 0622   MCHC 34.2 11/08/2022 0622   RDW 14.6 11/08/2022 0622   RDW 13.4 07/14/2022 1006   LYMPHSABS 1.0 11/08/2022 0622   LYMPHSABS 0.9 07/14/2022 1006   MONOABS 0.4 11/08/2022 0622   EOSABS 0.0 11/08/2022 0622   EOSABS 0.1 07/14/2022 1006   BASOSABS 0.0 11/08/2022 0622   BASOSABS 0.0 07/14/2022 1006    Assessment/Plan: 76 year old WF with profuse diarrhea-  ileitis/enteritis 1.Renal- A on CRF- baseline crt 1.2 to 1.4-  was over 2 upon admission in the setting of volume depletion and cont diarrhea-  improved after fluid given the  first day.  Since then -  the ins and outs have not been well recorded- has made up to 4 liters of diarrhea daily and not sure we have kept up with those losses.  I am going to recheck a urine-  still pending.  Could have established ATN at this point so could be slower to resolve. No acute needs for RRT.  Numbers essentially stable from yesterday to today -  cont higher dose IVF-  is non oliguric.  UOP has been improving daily and Scr as well but slowed over the last 24 hours.  Continue with IVF's and changes below.  2. Hypertension/volume  - was having huge amounts of diarrhea still.  Unclear what I's and O's are.   stopped the norvasc-  BP elevated so will decrease IVF"s to 150 mL/hr   3. Hypokalemia-  LR boluses -  trying to eat today-  currently on 80 mEq daily and K improved. 4. Anemia  - not an issue -  is decreasing with ivf-  not surprising but not to a critical point 5. Metabolic acidosis  - due to AKI and diarrhea, will change IVF's to isotonic bicarb and follow 6. Hypomagnesemia - s/p IV Mg and will continue to follow.  Mg needs to be given over long period of time to prevent renal wasting and may need 2 grams over 6-8 hours to prevent losses.     We will follow at a distance over the [place] - call with questions, she will seen in person on 11/11/22 if still an inpatient.  Donetta Potts, MD [facility]

## 2022-11-10 DIAGNOSIS — K529 Noninfective gastroenteritis and colitis, unspecified: Secondary | ICD-10-CM | POA: Diagnosis not present

## 2022-11-10 DIAGNOSIS — E876 Hypokalemia: Secondary | ICD-10-CM | POA: Diagnosis not present

## 2022-11-10 DIAGNOSIS — R197 Diarrhea, unspecified: Secondary | ICD-10-CM | POA: Diagnosis not present

## 2022-11-10 DIAGNOSIS — K219 Gastro-esophageal reflux disease without esophagitis: Secondary | ICD-10-CM | POA: Diagnosis not present

## 2022-11-10 LAB — BASIC METABOLIC PANEL
Anion gap: 5 (ref 5–15)
BUN: 25 mg/dL — ABNORMAL HIGH (ref 8–23)
CO2: 14 mmol/L — ABNORMAL LOW (ref 22–32)
Calcium: 7.3 mg/dL — ABNORMAL LOW (ref 8.9–10.3)
Chloride: 115 mmol/L — ABNORMAL HIGH (ref 98–111)
Creatinine, Ser: 2.75 mg/dL — ABNORMAL HIGH (ref 0.44–1.00)
GFR, Estimated: 17 mL/min — ABNORMAL LOW (ref >60)
Glucose, Bld: 123 mg/dL — ABNORMAL HIGH (ref 70–99)
Potassium: 3.9 mmol/L (ref 3.5–5.1)
Sodium: 134 mmol/L — ABNORMAL LOW (ref 135–145)

## 2022-11-10 LAB — MAGNESIUM: Magnesium: 2 mg/dL (ref 1.7–2.4)

## 2022-11-10 MED ORDER — DIPHENOXYLATE-ATROPINE 2.5-0.025 MG PO TABS: 1.0000 | ORAL_TABLET | Freq: Four times a day (QID) | ORAL | Status: DC | PRN

## 2022-11-10 NOTE — Progress Notes (Signed)
Pt slept through the night, vitals stable. Pt does not have flexiseal and has been transferring to [facility]. Pt's IV infiltrated, warm compress applied and arm elevated.

## 2022-11-10 NOTE — Progress Notes (Signed)
PROGRESS NOTE    Renee Maldonado  ZOX:[id number] DOB: June 30, 1946 DOA: 11/01/2022 PCP: Claretta Fraise, MD   Brief Narrative:    Renee Maldonado is a 75 y.o. female with medical history significant for hypertension, dyslipidemia, anxiety disorder, COPD, GERD, and prior thyroid cancer with resection who presented to the ED with ongoing profuse, watery diarrhea that has been ongoing for the last 1 week.  She was admitted for evaluation of diarrhea with associated severe hypokalemia as well as some AKI related to this.  She is noted to have adenovirus gastroenteritis.  She continues to have ongoing significant diarrhea with nausea and vomiting that requires aggressive IV fluid repletion.  Zofran and Lomotil scheduled.  Assessment & Plan:   Principal Problem:   Hypokalemia Active Problems:   Hyperlipidemia   Hypertension   GAD (generalized anxiety disorder)   GERD (gastroesophageal reflux disease)   COPD (chronic obstructive pulmonary disease) (HCC)   Primary osteoarthritis of right knee   Papillary thyroid carcinoma (HCC)   Diarrhea   Gastroenteritis   AKI (acute kidney injury) ([place])  Assessment and Plan:  Severe hypokalemia secondary to adenovirus diarrhea-hypokalemia and diarrhea ongoing -Continue aggressive IV fluid repletion -C. difficile negative -advanced to soft foods diet  -Imodium as needed -Scheduled Lomotil and Zofran 12/19, discussed case with GI with recommendations to continue the same and add probiotics -continue aggressive supportive measures -requested GI consult and appreciate assistance -Pt started on cholestyramine 4 gm BID by GI service  -diarrhea seems to be resolved now; discontinued scheduled lomotil; DC imodium;  -potassium has been repleted;     AKI on CKD stage IIIa -Appears prerenal in the setting of ongoing diarrhea -Aggressive IV fluid ongoing, increase fluid rate to 150 cc/h -Avoid nephrotoxic agents -worsening creatinine noted -consulted to  nephrology on 12/21  -increased hydration and suspect ATN  -slowly improving with treatments   Mild hyponatremia-resolved -Likely related to dehydration, monitor on normal saline infusion -Also uses HCTZ which has been held at this time   Hypomagnesemia -IV Mg ordered and repleted   Dyslipidemia -Continue home medications   Hypertension -Hold home HCTZ and continue other medications   GERD -Continue on PPI   History of COPD with prior tobacco abuse -Continue Breo -Rescue inhalers as needed   Obesity -BMI 81.19   Metabolic acidosis - nephrology started on sodium bicarb infusion starting 12/24 with improvement in CO2 from 10 --> 14    DVT prophylaxis: Heparin Code Status: Full Family Communication: none present during rounds Disposition Plan:  Status is: Inpatient Remains inpatient appropriate because: Need for IV medications.   Consultants:  None   Procedures:  None   Antimicrobials:  None  Subjective: Pt reports the flexiseal was removed and having much less diarrhea now. She kinda feels constipated now.      Objective: Vitals:   11/09/22 0431 11/09/22 0756 11/09/22 1415 11/09/22 2112  BP: (!) 161/77  (!) 158/75 138/72  Pulse: 100  98 91  Resp: 18  18 18   Temp: 99.2 F (37.3 C)  98.9 F (37.2 C) 98.1 F (36.7 C)  TempSrc:   Oral   SpO2: 96% 98% 99% 98%  Weight:      Height:        Intake/Output Summary (Last 24 hours) at 11/10/2022 1159 Last data filed at 11/10/2022 1117 Gross per 24 hour  Intake 3500 ml  Output 1100 ml  Net 2400 ml   Filed Weights   11/01/22 0215 11/02/22 0534  Weight: 85.1 kg 74.7  kg    Examination:  General exam: Appears calm and comfortable. Awake, alert, NAD.  Respiratory system: Clear to auscultation. Respiratory effort normal. Cardiovascular system: S1 & S2 heard, RRR.  Gastrointestinal system: Abdomen is soft, ND/NT, no HSM; BS normal.  Central nervous system: Alert and awake Extremities: IV infiltration  right hand puffiness;  Skin: No significant lesions noted Psychiatry: normal pleasant affect.  Data Reviewed: I have personally reviewed following labs and imaging studies  CBC: Recent Labs  Lab 11/04/22 0333 11/05/22 0349 11/06/22 0330 11/07/22 0332 11/08/22 0622  WBC 8.1 13.3* 11.0* 9.3 10.4  NEUTROABS  --   --  7.0 6.4 8.8*  HGB 11.7* 12.2 12.4 10.9* 11.3*  HCT 35.1* 36.3 37.6 32.3* 33.0*  MCV 81.4 80.7 80.5 79.0* 79.9*  PLT 250 255 259 216 160   Basic Metabolic Panel: Recent Labs  Lab 11/04/22 0333 11/05/22 0349 11/06/22 0330 11/07/22 0332 11/07/22 1515 11/08/22 0622 11/09/22 0414 11/10/22 0503  NA 136 136 136 135 136 138 134* 134*  K 3.5 3.6 3.2* 2.3* 3.6 3.3* 3.7 3.9  CL 115* 115* 113* 117* 118* 121* 118* 115*  CO2 13* 13* 14* 12* 12* 11* 10* 14*  GLUCOSE 122* 130* 116* 102* 123* 114* 127* 123*  BUN 31* 33* 37* 34* 31* 25* 23 25*  CREATININE 2.26* 2.71* 3.22* 3.25* 3.28* 2.91* 2.89* 2.75*  CALCIUM 8.2* 8.3* 8.1* 7.3* 7.7* 7.4* 7.2* 7.3*  MG 2.3 2.1 2.1  --   --   --  1.3* 2.0  PHOS  --   --   --   --  1.8*  --   --   --    GFR: Estimated Creatinine Clearance: 15.8 mL/min (A) (by C-G formula based on SCr of 2.75 mg/dL (H)). Liver Function Tests: Recent Labs  Lab 11/07/22 1515  ALBUMIN 2.6*   No results for input(s): "LIPASE", "AMYLASE" in the last 168 hours.  No results for input(s): "AMMONIA" in the last 168 hours. Coagulation Profile: No results for input(s): "INR", "PROTIME" in the last 168 hours. Cardiac Enzymes: No results for input(s): "CKTOTAL", "CKMB", "CKMBINDEX", "TROPONINI" in the last 168 hours. BNP (last 3 results) No results for input(s): "PROBNP" in the last 8760 hours. HbA1C: No results for input(s): "HGBA1C" in the last 72 hours. CBG: No results for input(s): "GLUCAP" in the last 168 hours.  Lipid Profile: No results for input(s): "CHOL", "HDL", "LDLCALC", "TRIG", "CHOLHDL", "LDLDIRECT" in the last 72 hours. Thyroid Function  Tests: No results for input(s): "TSH", "T4TOTAL", "FREET4", "T3FREE", "THYROIDAB" in the last 72 hours. Anemia Panel: No results for input(s): "VITAMINB12", "FOLATE", "FERRITIN", "TIBC", "IRON", "RETICCTPCT" in the last 72 hours. Sepsis Labs: No results for input(s): "PROCALCITON", "LATICACIDVEN" in the last 168 hours.  Recent Results (from the past 240 hour(s))  Resp panel by RT-PCR (RSV, Flu A&B, Covid) Anterior Nasal Swab     Status: None   Collection Time: 11/01/22  2:39 AM   Specimen: Anterior Nasal Swab  Result Value Ref Range Status   SARS Coronavirus 2 by RT PCR NEGATIVE NEGATIVE Final    Comment: (NOTE) SARS-CoV-2 target nucleic acids are NOT DETECTED.  The SARS-CoV-2 RNA is generally detectable in upper respiratory specimens during the acute phase of infection. The lowest concentration of SARS-CoV-2 viral copies this assay can detect is 138 copies/mL. A negative result does not preclude SARS-Cov-2 infection and should not be used as the sole basis for treatment or other patient management decisions. A negative result may occur with  improper specimen collection/handling, submission of specimen other than nasopharyngeal swab, presence of viral mutation(s) within the areas targeted by this assay, and inadequate number of viral copies(<138 copies/mL). A negative result must be combined with clinical observations, patient history, and epidemiological information. The expected result is Negative.  Fact Sheet for Patients:  EntrepreneurPulse.com.au  Fact Sheet for Healthcare Providers:  IncredibleEmployment.be  This test is no t yet approved or cleared by the [place] FDA and  has been authorized for detection and/or diagnosis of SARS-CoV-2 by FDA under an Emergency Use Authorization (EUA). This EUA will remain  in effect (meaning this test can be used) for the duration of the COVID-19 declaration under Section 564(b)(1) of the Act,  21 U.S.C.section 360bbb-3(b)(1), unless the authorization is terminated  or revoked sooner.       Influenza A by PCR NEGATIVE NEGATIVE Final   Influenza B by PCR NEGATIVE NEGATIVE Final    Comment: (NOTE) The Xpert Xpress SARS-CoV-2/FLU/RSV plus assay is intended as an aid in the diagnosis of influenza from Nasopharyngeal swab specimens and should not be used as a sole basis for treatment. Nasal washings and aspirates are unacceptable for Xpert Xpress SARS-CoV-2/FLU/RSV testing.  Fact Sheet for Patients: EntrepreneurPulse.com.au  Fact Sheet for Healthcare Providers: IncredibleEmployment.be  This test is not yet approved or cleared by the [place] FDA and has been authorized for detection and/or diagnosis of SARS-CoV-2 by FDA under an Emergency Use Authorization (EUA). This EUA will remain in effect (meaning this test can be used) for the duration of the COVID-19 declaration under Section 564(b)(1) of the Act, 21 U.S.C. section 360bbb-3(b)(1), unless the authorization is terminated or revoked.     Resp Syncytial Virus by PCR NEGATIVE NEGATIVE Final    Comment: (NOTE) Fact Sheet for Patients: EntrepreneurPulse.com.au  Fact Sheet for Healthcare Providers: IncredibleEmployment.be  This test is not yet approved or cleared by the [place] FDA and has been authorized for detection and/or diagnosis of SARS-CoV-2 by FDA under an Emergency Use Authorization (EUA). This EUA will remain in effect (meaning this test can be used) for the duration of the COVID-19 declaration under Section 564(b)(1) of the Act, 21 U.S.C. section 360bbb-3(b)(1), unless the authorization is terminated or revoked.  Performed at [facility], [address], [place], [place] [address]   Gastrointestinal Panel by PCR , Stool     Status: Abnormal   Collection Time: 11/01/22  2:49 AM   Specimen: Urine, In & Out Cath; Stool   Result Value Ref Range Status   Campylobacter species NOT DETECTED NOT DETECTED Final   Plesimonas shigelloides NOT DETECTED NOT DETECTED Final   Salmonella species NOT DETECTED NOT DETECTED Final   Yersinia enterocolitica NOT DETECTED NOT DETECTED Final   Vibrio species NOT DETECTED NOT DETECTED Final   Vibrio cholerae NOT DETECTED NOT DETECTED Final   Enteroaggregative E coli (EAEC) NOT DETECTED NOT DETECTED Final   Enteropathogenic E coli (EPEC) NOT DETECTED NOT DETECTED Final   Enterotoxigenic E coli (ETEC) NOT DETECTED NOT DETECTED Final   Shiga like toxin producing E coli (STEC) NOT DETECTED NOT DETECTED Final   Shigella/Enteroinvasive E coli (EIEC) NOT DETECTED NOT DETECTED Final   Cryptosporidium NOT DETECTED NOT DETECTED Final   Cyclospora cayetanensis NOT DETECTED NOT DETECTED Final   Entamoeba histolytica NOT DETECTED NOT DETECTED Final   Giardia lamblia NOT DETECTED NOT DETECTED Final   Adenovirus F40/41 DETECTED (A) NOT DETECTED Final   Astrovirus NOT DETECTED NOT DETECTED Final   Norovirus GI/GII  NOT DETECTED NOT DETECTED Final   Rotavirus A NOT DETECTED NOT DETECTED Final   Sapovirus (I, II, IV, and V) NOT DETECTED NOT DETECTED Final    Comment: Performed at [facility], [address] [place], [place] [address]  C Difficile Quick Screen w PCR reflex     Status: None   Collection Time: 11/01/22  2:49 AM   Specimen: Urine, In & Out Cath; Stool  Result Value Ref Range Status   C Diff antigen NEGATIVE NEGATIVE Final   C Diff toxin NEGATIVE NEGATIVE Final   C Diff interpretation No C. difficile detected.  Final    Comment: Performed at [facility], [address] [place], [place] [address]  MRSA Next Gen by PCR, Nasal     Status: None   Collection Time: 11/01/22  3:34 PM   Specimen: Nasal Mucosa; Nasal Swab  Result Value Ref Range Status   MRSA by PCR Next Gen NOT DETECTED NOT DETECTED Final    Comment: (NOTE) The GeneXpert MRSA Assay (FDA approved for  NASAL specimens only), is one component of a comprehensive MRSA colonization surveillance program. It is not intended to diagnose MRSA infection nor to guide or monitor treatment for MRSA infections. Test performance is not FDA approved in patients less than 74 years old. Performed at [facility], [address], [place], [place] [address]     Radiology Studies: No results found.  Scheduled Meds:  anastrozole  1 mg Oral q AM   cholestyramine  4 g Oral BID   famotidine  10 mg Oral Daily   fluticasone furoate-vilanterol  1 puff Inhalation Once per day on Sun Tue Thu Sat   heparin  5,000 Units Subcutaneous Q8H   melatonin  9 mg Oral QHS   ondansetron (ZOFRAN) IV  4 mg Intravenous Q6H   potassium chloride  40 mEq Oral BID   raloxifene  60 mg Oral Daily   rosuvastatin  20 mg Oral Daily   saccharomyces boulardii  250 mg Oral BID   traZODone  100 mg Oral QHS   Continuous Infusions:  sodium bicarbonate 150 mEq in sterile water 1,150 mL infusion 150 mL/hr at 11/10/22 1118    LOS: 9 days   Time spent: 35 minutes  Jeremey Bascom [name], MD [facility]  If 7PM-7AM, please contact night-coverage [email or web address] 11/10/2022, 11:59 AM

## 2022-11-11 ENCOUNTER — Inpatient Hospital Stay (HOSPITAL_COMMUNITY): Payer: Medicare HMO

## 2022-11-11 DIAGNOSIS — A09 Infectious gastroenteritis and colitis, unspecified: Secondary | ICD-10-CM | POA: Diagnosis not present

## 2022-11-11 DIAGNOSIS — A082 Adenoviral enteritis: Secondary | ICD-10-CM

## 2022-11-11 DIAGNOSIS — J439 Emphysema, unspecified: Secondary | ICD-10-CM

## 2022-11-11 LAB — URINALYSIS, ROUTINE W REFLEX MICROSCOPIC
Bilirubin Urine: NEGATIVE
Glucose, UA: 50 mg/dL — AB
Ketones, ur: NEGATIVE mg/dL
Leukocytes,Ua: NEGATIVE
Nitrite: NEGATIVE
Protein, ur: NEGATIVE mg/dL
Specific Gravity, Urine: 1.005 (ref 1.005–1.030)
pH: 5 (ref 5.0–8.0)

## 2022-11-11 LAB — BASIC METABOLIC PANEL
Anion gap: 5 (ref 5–15)
BUN: 26 mg/dL — ABNORMAL HIGH (ref 8–23)
CO2: 23 mmol/L (ref 22–32)
Calcium: 7.2 mg/dL — ABNORMAL LOW (ref 8.9–10.3)
Chloride: 106 mmol/L (ref 98–111)
Creatinine, Ser: 2.83 mg/dL — ABNORMAL HIGH (ref 0.44–1.00)
GFR, Estimated: 17 mL/min — ABNORMAL LOW (ref >60)
Glucose, Bld: 150 mg/dL — ABNORMAL HIGH (ref 70–99)
Potassium: 3.7 mmol/L (ref 3.5–5.1)
Sodium: 134 mmol/L — ABNORMAL LOW (ref 135–145)

## 2022-11-11 LAB — PHOSPHORUS: Phosphorus: <1 mg/dL — CL (ref 2.5–4.6)

## 2022-11-11 LAB — MAGNESIUM: Magnesium: 1.7 mg/dL (ref 1.7–2.4)

## 2022-11-11 MED ORDER — POTASSIUM PHOSPHATES 15 MMOLE/5ML IV SOLN
25.0000 mmol | Freq: Once | INTRAVENOUS | Status: AC
  Administered 2022-11-11: 25 mmol via INTRAVENOUS
  Filled 2022-11-11: qty 8.33

## 2022-11-11 MED ORDER — K PHOS MONO-SOD PHOS DI & MONO 155-852-130 MG PO TABS: 250.0000 mg | ORAL_TABLET | Freq: Once | ORAL | Status: DC

## 2022-11-11 MED ORDER — MAGNESIUM SULFATE 2 GM/50ML IV SOLN
2.0000 g | Freq: Once | INTRAVENOUS | Status: AC
  Administered 2022-11-11: 2 g via INTRAVENOUS
  Filled 2022-11-11: qty 50

## 2022-11-11 MED ORDER — GUAIFENESIN-DM 100-10 MG/5ML PO SYRP
5.0000 mL | ORAL_SOLUTION | ORAL | Status: DC | PRN
  Administered 2022-11-11 – 2022-11-15 (×5): 5 mL via ORAL
  Filled 2022-11-11 (×6): qty 5

## 2022-11-11 NOTE — Progress Notes (Addendum)
PROGRESS NOTE    Renee Maldonado  LOV:[id number] DOB: 01-06-1946 DOA: 11/01/2022 PCP: Claretta Fraise, MD   Brief Narrative:    Renee Maldonado is a 76 y.o. female with medical history significant for hypertension, dyslipidemia, anxiety disorder, COPD, GERD, and prior thyroid cancer with resection who presented to the ED with ongoing profuse, watery diarrhea that has been ongoing for the last 1 week.  She was admitted for evaluation of diarrhea with associated severe hypokalemia as well as some AKI related to this.  She is noted to have adenovirus gastroenteritis.  She continues to have ongoing significant diarrhea with nausea and vomiting that requires aggressive IV fluid repletion.  Zofran and Lomotil scheduled.  Assessment & Plan:   Principal Problem:   Hypokalemia Active Problems:   Hyperlipidemia   Hypertension   GAD (generalized anxiety disorder)   GERD (gastroesophageal reflux disease)   COPD (chronic obstructive pulmonary disease) (HCC)   Primary osteoarthritis of right knee   Papillary thyroid carcinoma (HCC)   Diarrhea   Gastroenteritis   AKI (acute kidney injury) ([place])  Assessment and Plan:  Severe hypokalemia secondary to adenovirus diarrhea-hypokalemia and diarrhea ongoing -Continue aggressive IV fluid repletion -C. difficile negative -advanced to soft foods diet  -Imodium as needed -Scheduled Lomotil and Zofran 12/19, discussed case with GI with recommendations to continue the same and add probiotics -continue aggressive supportive measures -requested GI consult and appreciate assistance -Pt started on cholestyramine 4 gm BID by GI service  -diarrhea seems to be resolved now; discontinued scheduled lomotil; DC imodium;  -potassium has been repleted;    Severe hypophosphatemia -kphos replacement ordered by pharm D on 12/26 -recheck phos in AM    AKI on CKD stage IIIa -Appears prerenal in the setting of ongoing diarrhea -Aggressive IV fluid ongoing, increase fluid  rate to 150 cc/h -Avoid nephrotoxic agents -worsening creatinine noted -consulted to nephrology on 12/21  -increased hydration and suspect ATN  -slowly improving with treatments   Mild hyponatremia-resolved -Likely related to dehydration, monitor on normal saline infusion -Also uses HCTZ which has been held at this time   Hypomagnesemia -IV Mg ordered and repleted   Anal Fissure -sitz bath daily x 20 mins -add fiber when ok with GI team  -shower after every bowel movement  -hydrate  -if no improvement will ask surgery to see  Dyslipidemia -Continue home medications   Hypertension -Hold home HCTZ and continue other medications   GERD -Continue on PPI   History of COPD with prior tobacco abuse -Continue Breo -Rescue inhalers as needed   Obesity -BMI 88.41   Metabolic acidosis - nephrology started on sodium bicarb infusion starting 12/24 with improvement in CO2 from 10 --> 14 -->23   DVT prophylaxis: Heparin Code Status: Full Family Communication: none present during rounds Disposition Plan:  Status is: Inpatient Remains inpatient appropriate because: Need for IV medications.   Consultants:  None   Procedures:  None   Antimicrobials:  None  Subjective: Pt reports anal fissure.       Objective: Vitals:   11/11/22 0550 11/11/22 0809 11/11/22 0836 11/11/22 1530  BP: (!) 125/58  (!) 143/71 138/67  Pulse: 91  94 84  Resp: 14  18 18   Temp: 99.7 F (37.6 C)  99.1 F (37.3 C) 98.8 F (37.1 C)  TempSrc: Oral  Oral Oral  SpO2: 96% 95% 95% 97%  Weight:      Height:        Intake/Output Summary (Last 24 hours) at 11/11/2022 1607  Last data filed at 11/11/2022 1601 Gross per 24 hour  Intake 3154.8 ml  Output 1050 ml  Net 2104.8 ml   Filed Weights   11/01/22 0215 11/02/22 0534  Weight: 85.1 kg 74.7 kg    Examination:  General exam: Appears calm and comfortable. Awake, alert, NAD.  Respiratory system: Clear to auscultation. Respiratory effort  normal. Cardiovascular system: S1 & S2 heard, RRR.  Gastrointestinal system: Abdomen is soft, ND/NT, no HSM; BS normal.  Central nervous system: Alert and awake Extremities: IV infiltration right hand puffiness improving;  Skin: No significant lesions noted Psychiatry: normal pleasant affect.  Data Reviewed: I have personally reviewed following labs and imaging studies  CBC: Recent Labs  Lab 11/05/22 0349 11/06/22 0330 11/07/22 0332 11/08/22 0622  WBC 13.3* 11.0* 9.3 10.4  NEUTROABS  --  7.0 6.4 8.8*  HGB 12.2 12.4 10.9* 11.3*  HCT 36.3 37.6 32.3* 33.0*  MCV 80.7 80.5 79.0* 79.9*  PLT 255 259 216 161   Basic Metabolic Panel: Recent Labs  Lab 11/05/22 0349 11/06/22 0330 11/07/22 0332 11/07/22 1515 11/08/22 0622 11/09/22 0414 11/10/22 0503 11/11/22 0401  NA 136 136   < > 136 138 134* 134* 134*  K 3.6 3.2*   < > 3.6 3.3* 3.7 3.9 3.7  CL 115* 113*   < > 118* 121* 118* 115* 106  CO2 13* 14*   < > 12* 11* 10* 14* 23  GLUCOSE 130* 116*   < > 123* 114* 127* 123* 150*  BUN 33* 37*   < > 31* 25* 23 25* 26*  CREATININE 2.71* 3.22*   < > 3.28* 2.91* 2.89* 2.75* 2.83*  CALCIUM 8.3* 8.1*   < > 7.7* 7.4* 7.2* 7.3* 7.2*  MG 2.1 2.1  --   --   --  1.3* 2.0 1.7  PHOS  --   --   --  1.8*  --   --   --  <1.0*   < > = values in this interval not displayed.   GFR: Estimated Creatinine Clearance: 15.4 mL/min (A) (by C-G formula based on SCr of 2.83 mg/dL (H)). Liver Function Tests: Recent Labs  Lab 11/07/22 1515  ALBUMIN 2.6*   No results for input(s): "LIPASE", "AMYLASE" in the last 168 hours.  No results for input(s): "AMMONIA" in the last 168 hours. Coagulation Profile: No results for input(s): "INR", "PROTIME" in the last 168 hours. Cardiac Enzymes: No results for input(s): "CKTOTAL", "CKMB", "CKMBINDEX", "TROPONINI" in the last 168 hours. BNP (last 3 results) No results for input(s): "PROBNP" in the last 8760 hours. HbA1C: No results for input(s): "HGBA1C" in the last  72 hours. CBG: No results for input(s): "GLUCAP" in the last 168 hours.  Lipid Profile: No results for input(s): "CHOL", "HDL", "LDLCALC", "TRIG", "CHOLHDL", "LDLDIRECT" in the last 72 hours. Thyroid Function Tests: No results for input(s): "TSH", "T4TOTAL", "FREET4", "T3FREE", "THYROIDAB" in the last 72 hours. Anemia Panel: No results for input(s): "VITAMINB12", "FOLATE", "FERRITIN", "TIBC", "IRON", "RETICCTPCT" in the last 72 hours. Sepsis Labs: No results for input(s): "PROCALCITON", "LATICACIDVEN" in the last 168 hours.  No results found for this or any previous visit (from the past 240 hour(s)).   Radiology Studies: DG Abd 1 View  Result Date: 11/11/2022 CLINICAL DATA:  Abdominal pain. EXAM: ABDOMEN - 1 VIEW COMPARISON:  CT abdomen and pelvis 11/01/2022 FINDINGS: The upper abdomen was incompletely imaged. Gas is present in loops of small bowel throughout the abdomen without frank bowel dilatation to indicate mechanical  obstruction. No abnormal abdominal or pelvic calcification is identified. There is asymmetrically severe left hip osteoarthrosis. IMPRESSION: No evidence of bowel obstruction. Electronically Signed   By: Logan Bores M.D.   On: 11/11/2022 08:38    Scheduled Meds:  anastrozole  1 mg Oral q AM   cholestyramine  4 g Oral BID   famotidine  10 mg Oral Daily   fluticasone furoate-vilanterol  1 puff Inhalation Once per day on Sun Tue Thu Sat   heparin  5,000 Units Subcutaneous Q8H   melatonin  9 mg Oral QHS   ondansetron (ZOFRAN) IV  4 mg Intravenous Q6H   potassium chloride  40 mEq Oral BID   raloxifene  60 mg Oral Daily   rosuvastatin  20 mg Oral Daily   saccharomyces boulardii  250 mg Oral BID   traZODone  100 mg Oral QHS   Continuous Infusions:  potassium PHOSPHATE IVPB (in mmol) 25 mmol (11/11/22 1205)   sodium bicarbonate 150 mEq in sterile water 1,150 mL infusion 50 mL/hr at 11/11/22 0838    LOS: 10 days   Time spent: 35 minutes  Keandrea Tapley [name],  MD [facility]  If 7PM-7AM, please contact night-coverage [email or web address] 11/11/2022, 4:07 PM

## 2022-11-11 NOTE — Progress Notes (Signed)
Patient ID: Renee Maldonado, female   DOB: [birth year], 76 y.o.   MRN: [id number] S: Complaining of dysuria and lower abdominal pain.  No diarrhea, now feels constipated O:BP (!) 125/58 (BP Location: Right Arm)   Pulse 91   Temp 99.7 F (37.6 C) (Oral)   Resp 14   Ht 5\' 1"  (1.549 m)   Wt 74.7 kg   SpO2 95%   BMI 31.12 kg/m   Intake/Output Summary (Last 24 hours) at 11/11/2022 0824 Last data filed at 11/11/2022 0700 Gross per 24 hour  Intake 1202.5 ml  Output 1700 ml  Net -497.5 ml   Intake/Output: I/O last 3 completed shifts: In: 5462 [P.O.:1080; I.V.:2300] Out: 2100 [Urine:2100]  Intake/Output this shift:  No intake/output data recorded. Weight change:  Gen: NAD CVS: RRR Resp: CTA Abd: +BS, soft, NT/ND Ext: trace pretibial edema  Recent Labs  Lab 11/06/22 0330 11/07/22 0332 11/07/22 1515 11/08/22 0622 11/09/22 0414 11/10/22 0503 11/11/22 0401  NA 136 135 136 138 134* 134* 134*  K 3.2* 2.3* 3.6 3.3* 3.7 3.9 3.7  CL 113* 117* 118* 121* 118* 115* 106  CO2 14* 12* 12* 11* 10* 14* 23  GLUCOSE 116* 102* 123* 114* 127* 123* 150*  BUN 37* 34* 31* 25* 23 25* 26*  CREATININE 3.22* 3.25* 3.28* 2.91* 2.89* 2.75* 2.83*  ALBUMIN  --   --  2.6*  --   --   --   --   CALCIUM 8.1* 7.3* 7.7* 7.4* 7.2* 7.3* 7.2*  PHOS  --   --  1.8*  --   --   --  <1.0*   Liver Function Tests: Recent Labs  Lab 11/07/22 1515  ALBUMIN 2.6*   No results for input(s): "LIPASE", "AMYLASE" in the last 168 hours. No results for input(s): "AMMONIA" in the last 168 hours. CBC: Recent Labs  Lab 11/05/22 0349 11/06/22 0330 11/07/22 0332 11/08/22 0622  WBC 13.3* 11.0* 9.3 10.4  NEUTROABS  --  7.0 6.4 8.8*  HGB 12.2 12.4 10.9* 11.3*  HCT 36.3 37.6 32.3* 33.0*  MCV 80.7 80.5 79.0* 79.9*  PLT 255 259 216 170   Cardiac Enzymes: No results for input(s): "CKTOTAL", "CKMB", "CKMBINDEX", "TROPONINI" in the last 168 hours. CBG: No results for input(s): "GLUCAP" in the last 168 hours.  Iron Studies:  No results for input(s): "IRON", "TIBC", "TRANSFERRIN", "FERRITIN" in the last 72 hours. Studies/Results: No results found.  anastrozole  1 mg Oral q AM   cholestyramine  4 g Oral BID   famotidine  10 mg Oral Daily   fluticasone furoate-vilanterol  1 puff Inhalation Once per day on Sun Tue Thu Sat   heparin  5,000 Units Subcutaneous Q8H   melatonin  9 mg Oral QHS   ondansetron (ZOFRAN) IV  4 mg Intravenous Q6H   potassium chloride  40 mEq Oral BID   raloxifene  60 mg Oral Daily   rosuvastatin  20 mg Oral Daily   saccharomyces boulardii  250 mg Oral BID   traZODone  100 mg Oral QHS    BMET    Component Value Date/Time   NA 134 (L) 11/11/2022 0401   NA 143 07/14/2022 1006   K 3.7 11/11/2022 0401   CL 106 11/11/2022 0401   CO2 23 11/11/2022 0401   GLUCOSE 150 (H) 11/11/2022 0401   BUN 26 (H) 11/11/2022 0401   BUN 18 07/14/2022 1006   CREATININE 2.83 (H) 11/11/2022 0401   CALCIUM 7.2 (L) 11/11/2022 0401   GFRNONAA  17 (L) 11/11/2022 0401   GFRAA 50 (L) 12/13/2020 0913   CBC    Component Value Date/Time   WBC 10.4 11/08/2022 0622   RBC 4.13 11/08/2022 0622   HGB 11.3 (L) 11/08/2022 0622   HGB 12.0 07/14/2022 1006   HCT 33.0 (L) 11/08/2022 0622   HCT 36.8 07/14/2022 1006   PLT 170 11/08/2022 0622   PLT 274 07/14/2022 1006   MCV 79.9 (L) 11/08/2022 0622   MCV 83 07/14/2022 1006   MCH 27.4 11/08/2022 0622   MCHC 34.2 11/08/2022 0622   RDW 14.6 11/08/2022 0622   RDW 13.4 07/14/2022 1006   LYMPHSABS 1.0 11/08/2022 0622   LYMPHSABS 0.9 07/14/2022 1006   MONOABS 0.4 11/08/2022 0622   EOSABS 0.0 11/08/2022 0622   EOSABS 0.1 07/14/2022 1006   BASOSABS 0.0 11/08/2022 0622   BASOSABS 0.0 07/14/2022 1006    Assessment/Plan: 76 year old WF with profuse diarrhea-  ileitis/enteritis 1.Renal- A on CRF- baseline crt 1.2 to 1.4-  was over 2 upon admission in the setting of volume depletion and cont diarrhea-  improved after fluid given the first day.  Since then -  the ins and  outs have not been well recorded- has made up to 4 liters of diarrhea daily and not sure we have kept up with those losses.  I am going to recheck a urine-  still pending.  Could have established ATN at this point so could be slower to resolve. No acute needs for RRT.  Numbers essentially stable from yesterday to today -  UOP has been improving daily and Scr as well but slowed over the last 24 hours.  Will decrease IVF's since diarrhea has improved.  2. Hypertension/volume  - was having huge amounts of diarrhea still.  Unclear what I's and O's are.   stopped the norvasc-  BP improved and diarrhea stopped for now.  Will decrease IVF's to 50 mL/hr and follow.  3. Hypokalemia-  improved-  trying to eat today-  currently on 80 mEq daily and K improved. 4. Anemia  - not an issue -  is decreasing with ivf-  not surprising but not to a critical point 5. Metabolic acidosis  - due to AKI and diarrhea, resolved with isotonic bicarb.  Will decrease rate to 50 mL/hr and follow 6. Hypomagnesemia - s/p IV Mg and will continue to follow.  Mg needs to be given over long period of time to prevent renal wasting and may need 2 grams over 6-8 hours to prevent losses. 7. Hypophosphatemia - will replete with IV K-phos.  Donetta Potts, MD [facility]

## 2022-11-11 NOTE — Progress Notes (Signed)
Patient c/o wheezing. Requesting PRN Neb treatment. Respiratory called.

## 2022-11-11 NOTE — Progress Notes (Signed)
Renee Maldonado, M.[place]. Gastroenterology & [facility]atology   Interval History:  Patient reports feeling well but she states that she had 4 loose bowel movements since today in the morning.  Denies any abdominal pain, nausea, vomiting, fever, chills.  Inpatient Medications:  Current Facility-Administered Medications:    acetaminophen (TYLENOL) tablet 650 mg, 650 mg, Oral, Q6H PRN **OR** acetaminophen (TYLENOL) suppository 650 mg, 650 mg, Rectal, Q6H PRN, Renee Maldonado, Renee D, DO   albuterol (PROVENTIL) (2.5 MG/3ML) 0.083% nebulizer solution 2.5 mg, 2.5 mg, Nebulization, Q4H PRN, Maldonado, [name], DO, 2.5 mg at 11/10/22 2354   anastrozole (ARIMIDEX) tablet 1 mg, 1 mg, Oral, q AM, Renee Maldonado, Renee D, DO, 1 mg at 11/11/22 1201   cholestyramine (QUESTRAN) packet 4 g, 4 g, Oral, BID, Maldonado, Renee L, NP, 4 g at 11/11/22 1159   cycloSPORINE (RESTASIS) 0.05 % ophthalmic emulsion 1 drop, 1 drop, Both Eyes, BID PRN, Renee Maldonado, Renee D, DO   diphenoxylate-atropine (LOMOTIL) 2.5-0.025 MG per tablet 1 tablet, 1 tablet, Oral, QID PRN, Renee Maldonado, Clanford L, MD   famotidine (PEPCID) tablet 10 mg, 10 mg, Oral, Daily, Maldonado, [name], DO, 10 mg at 11/11/22 0839   fluticasone furoate-vilanterol (BREO ELLIPTA) 200-25 MCG/ACT 1 puff, 1 puff, Inhalation, Once per day on Sun Tue Thu Sat, Renee, Renee D, DO, 1 puff at 11/11/22 3267   heparin injection 5,000 Units, 5,000 Units, Subcutaneous, Q8H, Renee, Renee D, DO, 5,000 Units at 11/11/22 1245   hydrOXYzine (ATARAX) tablet 25 mg, 25 mg, Oral, Q4H PRN, Renee Maldonado, Renee D, DO   melatonin tablet 9 mg, 9 mg, Oral, QHS, Maldonado, [name], DO, 9 mg at 11/10/22 2229   ondansetron (ZOFRAN) tablet 4 mg, 4 mg, Oral, Q6H PRN **OR** ondansetron (ZOFRAN) injection 4 mg, 4 mg, Intravenous, Q6H PRN, Renee Maldonado, Renee D, DO, 4 mg at 11/02/22 2200   ondansetron (ZOFRAN) injection 4 mg, 4 mg, Intravenous, Q6H, Renee, Renee D, DO, 4 mg at 11/11/22 1200   polyvinyl alcohol (LIQUIFILM TEARS) 1.4 % ophthalmic  solution 1-2 drop, 1-2 drop, Both Eyes, TID PRN, Renee Maldonado, Renee D, DO   potassium chloride (KLOR-CON) packet 40 mEq, 40 mEq, Oral, BID, Renee Heinz, MD, 40 mEq at 11/11/22 0839   potassium PHOSPHATE 25 mmol in dextrose 5 % 500 mL infusion, 25 mmol, Intravenous, Once, Renee, Clanford L, MD, Last Rate: 85 mL/hr at 11/11/22 1205, 25 mmol at 11/11/22 1205   raloxifene (EVISTA) tablet 60 mg, 60 mg, Oral, Daily, Renee Maldonado, Renee D, DO, 60 mg at 11/11/22 8099   rosuvastatin (CRESTOR) tablet 20 mg, 20 mg, Oral, Daily, Renee Maldonado, Renee D, DO, 20 mg at 11/11/22 1159   saccharomyces boulardii (FLORASTOR) capsule 250 mg, 250 mg, Oral, BID, Renee Maldonado, Renee D, DO, 250 mg at 11/11/22 8338   sodium bicarbonate 150 mEq in sterile water 1,150 mL infusion, , Intravenous, Continuous, Renee Heinz, MD, Last Rate: 50 mL/hr at 11/11/22 0838, Rate Change at 11/11/22 0838   sodium chloride (OCEAN) 0.65 % nasal spray 1 spray, 1 spray, Each Nare, PRN, Renee Maldonado, Renee D, DO   traZODone (DESYREL) tablet 100 mg, 100 mg, Oral, QHS, Renee, Renee D, DO, 100 mg at 11/10/22 2229   I/O    Intake/Output Summary (Last 24 hours) at 11/11/2022 1205 Last data filed at 11/11/2022 0700 Gross per 24 hour  Intake 360 ml  Output 1050 ml  Net -690 ml     Physical Exam: Temp:  [98 F (36.7 C)-99.8 F (37.7 C)] 99.1 F (37.3 C) (12/26 0836) Pulse Rate:  [91-94]  94 (12/26 0836) Resp:  [14-18] 18 (12/26 0836) BP: (125-147)/(58-76) 143/71 (12/26 0836) SpO2:  [94 %-97 %] 95 % (12/26 0836)  Temp (24hrs), Avg:99.2 F (37.3 C), Min:98 F (36.7 C), Max:99.8 F (37.7 C) GENERAL: The patient is AO x3, in no acute distress. HEENT: Head is normocephalic and atraumatic. EOMI are intact. Mouth is well hydrated and without lesions. NECK: Supple. No masses LUNGS: Clear to auscultation. No presence of rhonchi/wheezing/rales. Adequate chest expansion HEART: RRR, normal s1 and s2. ABDOMEN: Soft, nontender, no guarding, no peritoneal signs, and  nondistended. BS +. No masses. EXTREMITIES: Without any cyanosis, clubbing, rash, lesions or edema. NEUROLOGIC: AOx3, no focal motor deficit. SKIN: no jaundice, no rashes  Laboratory Data: CBC:     Component Value Date/Time   WBC 10.4 11/08/2022 0622   RBC 4.13 11/08/2022 0622   HGB 11.3 ([place]) 11/08/2022 0622   HGB 12.0 07/14/2022 1006   HCT 33.0 ([place]) 11/08/2022 0622   HCT 36.8 07/14/2022 1006   PLT 170 11/08/2022 0622   PLT 274 07/14/2022 1006   MCV 79.9 ([place]) 11/08/2022 0622   MCV 83 07/14/2022 1006   MCH 27.4 11/08/2022 0622   MCHC 34.2 11/08/2022 0622   RDW 14.6 11/08/2022 0622   RDW 13.4 07/14/2022 1006   LYMPHSABS 1.0 11/08/2022 0622   LYMPHSABS 0.9 07/14/2022 1006   MONOABS 0.4 11/08/2022 0622   EOSABS 0.0 11/08/2022 0622   EOSABS 0.1 07/14/2022 1006   BASOSABS 0.0 11/08/2022 0622   BASOSABS 0.0 07/14/2022 1006   COAG:  Lab Results  Component Value Date   INR 0.9 10/02/2020    BMP:     Latest Ref Rng & Units 11/11/2022    4:01 AM 11/10/2022    5:03 AM 11/09/2022    4:14 AM  BMP  Glucose 70 - 99 mg/dL 150  123  127   BUN 8 - 23 mg/dL _0 Creatinine 0.44 - 1.00 mg/dL 2.83  2.75  2.89   Sodium 135 - 145 mmol/[place] 134  134  134   Potassium 3.5 - 5.1 mmol/[place] 3.7  3.9  3.7   Chloride 98 - 111 mmol/[place] 106  115  118   CO2 22 - 32 mmol/[place] _1 Calcium 8.9 - 10.3 mg/dL 7.2  7.3  7.2     HEPATIC:     Latest Ref Rng & Units 11/07/2022    3:15 PM 11/02/2022    5:26 AM 11/01/2022    2:52 AM  Hepatic Function  Total Protein 6.5 - 8.1 g/dL  6.7  7.8   Albumin 3.5 - 5.0 g/dL 2.6  3.8  4.5   AST 15 - 41 U/[place]  16  27   ALT 0 - 44 U/[place]  16  21   Alk Phosphatase 38 - 126 U/[place]  61  69   Total Bilirubin 0.3 - 1.2 mg/dL  0.7  1.1     CARDIAC: No results found for: "CKTOTAL", "CKMB", "CKMBINDEX", "TROPONINI"    Imaging: I personally reviewed and interpreted the available labs, imaging and endoscopic files.   Assessment/Plan: Briefly, this is a 76 year old  female with past medical history of anxiety, arthritis, breast cancer, COPD, GERD, HLD, HTN, OA, thyroid cancer, who came to the hospital after presenting persistent diarrhea for 1 week.  She was found to have positive adenovirus in stool testing and imaging changes consistent with enteritis secondary to adenovirus.  Notably, she developed significant diarrhea  leading to AKI that has been slowly improving. Her course was complicated by persistent diarrhea for which she was initially started on Imodium and Lomotil but eventually she was started on cholestyramine 4 g twice daily.   This has led to significant improvement of her diarrhea, however her stool was slightly loose today.  She should follow a soft diet and lactose-free diet for the next couple weeks.  Notably, was found to have possible cirrhosis on imaging which will need to be followed as outpatient.  -Continue supportive care -Continue with cholestyramine 4 g twice daily -Can use Imodium as needed for breakthrough episodes of diarrhea -GI soft diet, lactose-free.   Castaneda, MD Gastroenterology and Hepatology [facility]  

## 2022-11-11 NOTE — Progress Notes (Signed)
Patient c/o abdominal pain. Burning sensation when urinating and difficult to start stream. MD Zierle-Ghosh notified. Received order for UA and to do a bladder scan. Bladder scan noted 149. Patient abdomen distended and patient c/o pain when having bladder scan done.     Date and time results received: 11/11/22   Test: Phosphorus  Critical Value: <1.0  Name of Provider Notified: MD Zierle-Ghosh notified.   Orders Received? Or Actions Taken?: No orders at this time.

## 2022-11-12 DIAGNOSIS — R197 Diarrhea, unspecified: Secondary | ICD-10-CM | POA: Diagnosis not present

## 2022-11-12 DIAGNOSIS — N179 Acute kidney failure, unspecified: Secondary | ICD-10-CM | POA: Diagnosis not present

## 2022-11-12 DIAGNOSIS — K610 Anal abscess: Secondary | ICD-10-CM

## 2022-11-12 DIAGNOSIS — K529 Noninfective gastroenteritis and colitis, unspecified: Secondary | ICD-10-CM | POA: Diagnosis not present

## 2022-11-12 DIAGNOSIS — E876 Hypokalemia: Secondary | ICD-10-CM | POA: Diagnosis not present

## 2022-11-12 LAB — BASIC METABOLIC PANEL
Anion gap: 7 (ref 5–15)
BUN: 22 mg/dL (ref 8–23)
CO2: 24 mmol/L (ref 22–32)
Calcium: 7.3 mg/dL — ABNORMAL LOW (ref 8.9–10.3)
Chloride: 105 mmol/L (ref 98–111)
Creatinine, Ser: 2.67 mg/dL — ABNORMAL HIGH (ref 0.44–1.00)
GFR, Estimated: 18 mL/min — ABNORMAL LOW (ref >60)
Glucose, Bld: 108 mg/dL — ABNORMAL HIGH (ref 70–99)
Potassium: 4.1 mmol/L (ref 3.5–5.1)
Sodium: 136 mmol/L (ref 135–145)

## 2022-11-12 LAB — CBC
HCT: 28.7 % — ABNORMAL LOW (ref 36.0–46.0)
Hemoglobin: 9.9 g/dL — ABNORMAL LOW (ref 12.0–15.0)
MCH: 26.7 pg (ref 26.0–34.0)
MCHC: 34.5 g/dL (ref 30.0–36.0)
MCV: 77.4 fL — ABNORMAL LOW (ref 80.0–100.0)
Platelets: 152 10*3/uL (ref 150–400)
RBC: 3.71 MIL/uL — ABNORMAL LOW (ref 3.87–5.11)
RDW: 15.1 % (ref 11.5–15.5)
WBC: 5.2 10*3/uL (ref 4.0–10.5)
nRBC: 0 % (ref 0.0–0.2)

## 2022-11-12 LAB — PHOSPHORUS: Phosphorus: 2.6 mg/dL (ref 2.5–4.6)

## 2022-11-12 MED ORDER — POTASSIUM CHLORIDE 20 MEQ PO PACK
40.0000 meq | PACK | Freq: Every day | ORAL | Status: DC
  Administered 2022-11-13 – 2022-11-15 (×3): 40 meq via ORAL
  Filled 2022-11-12 (×3): qty 2

## 2022-11-12 NOTE — Progress Notes (Signed)
Subjective: Patient states that she has no abdominal pain but has had 2 looser to watery stools this morning, one larger one early this morning. Denies rectal bleeding or melena. Patient states she is having some rectal pain when she has a BM and when she is sitting.   Objective: Vital signs in last 24 hours: Temp:  [98.4 F (36.9 C)-98.8 F (37.1 C)] 98.4 F (36.9 C) (12/27 0854) Pulse Rate:  [83-88] 85 (12/27 0854) Resp:  [16-18] 17 (12/27 0854) BP: (137-140)/(61-67) 140/63 (12/27 0854) SpO2:  [95 %-98 %] 95 % (12/27 0854) Last BM Date : 11/11/22 General:   Alert and oriented, pleasant Head:  Normocephalic and atraumatic. Eyes:  No icterus, sclera clear. Conjuctiva pink.  Mouth:  Without lesions, mucosa pink and moist.  Heart:  S1, S2 present, no murmurs noted.  Lungs: Clear to auscultation bilaterally, without wheezing, rales, or rhonchi.  Abdomen:  Bowel sounds present, soft, non-tender, non-distended. No HSM or hernias noted. No rebound or guarding. No masses appreciated  Rectal: Dildy, LPN present as witness, erythema present to peri anal area with abscess noted to 6'o clock of the anus, purulent drainage present with pain to palpation, no internal exam done due to patient discomfort.  Msk:  Symmetrical without gross deformities. Normal posture. Pulses:  Normal pulses noted. Extremities:  Without clubbing or edema. Neurologic:  Alert and  oriented x4;  grossly normal neurologically. Skin:  Warm and dry, intact without significant lesions.  Psych:  Alert and cooperative. Normal mood and affect.  Intake/Output from previous day: 12/26 0701 - 12/27 0700 In: 3677.9 [P.O.:240; I.V.:3343.4; IV Piggyback:94.5] Out: 900 [Urine:900] Intake/Output this shift: Total I/O In: 240 [P.O.:240] Out: -   Lab Results: Recent Labs    11/12/22 0356  WBC 5.2  HGB 9.9*  HCT 28.7*  PLT 152   BMET Recent Labs    11/10/22 0503 11/11/22 0401 11/12/22 0356  NA 134* 134* 136  K 3.9  3.7 4.1  CL 115* 106 105  CO2 14* 23 24  GLUCOSE 123* 150* 108*  BUN 25* 26* 22  CREATININE 2.75* 2.83* 2.67*  CALCIUM 7.3* 7.2* 7.3*    Studies/Results: DG Abd 1 View  Result Date: 11/11/2022 CLINICAL DATA:  Abdominal pain. EXAM: ABDOMEN - 1 VIEW COMPARISON:  CT abdomen and pelvis 11/01/2022 FINDINGS: The upper abdomen was incompletely imaged. Gas is present in loops of small bowel throughout the abdomen without frank bowel dilatation to indicate mechanical obstruction. No abnormal abdominal or pelvic calcification is identified. There is asymmetrically severe left hip osteoarthrosis. IMPRESSION: No evidence of bowel obstruction. Electronically Signed   By: Logan Bores M.D.   On: 11/11/2022 08:38    Assessment: Renee Maldonado is a 76 year old female with past medical history of anxiety, arthritis, breast cancer, COPD, GERD, HLD, HTN, OA, thyroid cancer, who came to the hospital after presenting persistent diarrhea for 1 week.    Diarrhea secondary to adenovirus:  positive adenovirus in stool testing and imaging changes consistent with enteritis secondary to adenovirus.  She developed significant diarrhea leading to AKI that has been slowly improving. course was complicated by persistent diarrhea for which she was initially started on Imodium and Lomotil with cholestyramine 4 g twice daily then being added for further management.   This has led to significant improvement of her diarrhea, stool remains slightly loose today, x2. Though nursing staff reports a significant slowing in stooling today as compared to the past few days. Appears she has not had imodium or  lomotil in a few days. She should follow a soft diet and lactose-free diet for the next couple weeks. patient is concerned that stools remain loose, however, we discussed that stools may remain loose for a while longer, given her significant clinical improvement, will continue with current regimen unless she presents with worsening/more  frequent stooling.   Perianal abscess: patient complaining of pain to her rectal area that began yesterday. Nurse yesterday noted some purulence on exam, hospitalist was made aware. Rectal exam today reveals approximately 2.5-3cm abscess located 6'clock to the anus with some purulent drainage and tenderness. Recommend general surgery consult for further evaluation/management of this. No fevers noted today. WBC normal at 5.2  Cirrhosis: found to have possible cirrhosis on inpatient imaging which will need to be followed as outpatient.  Plan: Continue with supportive measures Continue cholestyramine 4g BID Imodium/Lomotil PRN GI soft diet/lactose free Outpatient cirrhosis care after acute illness resolved Gen surg consult for perianal abscess    LOS: 11 days    11/12/2022, 9:53 AM  Esco Joslyn L. Alver Sorrow, MSN, APRN, AGNP-C Adult-Gerontology Nurse Practitioner [facility] at [facility]

## 2022-11-12 NOTE — Progress Notes (Signed)
Patient ID: Renee Maldonado, female   DOB: 06/18/1946, 76 y.o.   MRN: [id number] S: Concerned about a painful cyst on her rectum.  Had one loose BM this morning. O:BP (!) 140/63 (BP Location: Right Arm)   Pulse 85   Temp 98.4 F (36.9 C) (Oral)   Resp 17   Ht 5\' 1"  (1.549 m)   Wt 74.7 kg   SpO2 95%   BMI 31.12 kg/m   Intake/Output Summary (Last 24 hours) at 11/12/2022 1000 Last data filed at 11/12/2022 0836 Gross per 24 hour  Intake 3917.92 ml  Output 900 ml  Net 3017.92 ml   Intake/Output: I/O last 3 completed shifts: In: 3677.9 [P.O.:240; I.V.:3343.4; IV Piggyback:94.5] Out: 1950 [Urine:1950]  Intake/Output this shift:  Total I/O In: 240 [P.O.:240] Out: -  Weight change:  Gen: NAD CVS: RRR Resp:CTA Abd: +BS, soft, NT/ND Ext: no edema  Recent Labs  Lab 11/07/22 0332 11/07/22 1515 11/08/22 0622 11/09/22 0414 11/10/22 0503 11/11/22 0401 11/12/22 0356  NA 135 136 138 134* 134* 134* 136  K 2.3* 3.6 3.3* 3.7 3.9 3.7 4.1  CL 117* 118* 121* 118* 115* 106 105  CO2 12* 12* 11* 10* 14* 23 24  GLUCOSE 102* 123* 114* 127* 123* 150* 108*  BUN 34* 31* 25* 23 25* 26* 22  CREATININE 3.25* 3.28* 2.91* 2.89* 2.75* 2.83* 2.67*  ALBUMIN  --  2.6*  --   --   --   --   --   CALCIUM 7.3* 7.7* 7.4* 7.2* 7.3* 7.2* 7.3*  PHOS  --  1.8*  --   --   --  <1.0* 2.6   Liver Function Tests: Recent Labs  Lab 11/07/22 1515  ALBUMIN 2.6*   No results for input(s): "LIPASE", "AMYLASE" in the last 168 hours. No results for input(s): "AMMONIA" in the last 168 hours. CBC: Recent Labs  Lab 11/06/22 0330 11/07/22 0332 11/08/22 0622 11/12/22 0356  WBC 11.0* 9.3 10.4 5.2  NEUTROABS 7.0 6.4 8.8*  --   HGB 12.4 10.9* 11.3* 9.9*  HCT 37.6 32.3* 33.0* 28.7*  MCV 80.5 79.0* 79.9* 77.4*  PLT 259 216 170 152   Cardiac Enzymes: No results for input(s): "CKTOTAL", "CKMB", "CKMBINDEX", "TROPONINI" in the last 168 hours. CBG: No results for input(s): "GLUCAP" in the last 168 hours.  Iron  Studies: No results for input(s): "IRON", "TIBC", "TRANSFERRIN", "FERRITIN" in the last 72 hours. Studies/Results: DG Abd 1 View  Result Date: 11/11/2022 CLINICAL DATA:  Abdominal pain. EXAM: ABDOMEN - 1 VIEW COMPARISON:  CT abdomen and pelvis 11/01/2022 FINDINGS: The upper abdomen was incompletely imaged. Gas is present in loops of small bowel throughout the abdomen without frank bowel dilatation to indicate mechanical obstruction. No abnormal abdominal or pelvic calcification is identified. There is asymmetrically severe left hip osteoarthrosis. IMPRESSION: No evidence of bowel obstruction. Electronically Signed   By: Logan Bores M.D.   On: 11/11/2022 08:38    anastrozole  1 mg Oral q AM   cholestyramine  4 g Oral BID   famotidine  10 mg Oral Daily   fluticasone furoate-vilanterol  1 puff Inhalation Once per day on Sun Tue Thu Sat   heparin  5,000 Units Subcutaneous Q8H   melatonin  9 mg Oral QHS   ondansetron (ZOFRAN) IV  4 mg Intravenous Q6H   potassium chloride  40 mEq Oral BID   raloxifene  60 mg Oral Daily   rosuvastatin  20 mg Oral Daily   saccharomyces boulardii  250  mg Oral BID   traZODone  100 mg Oral QHS    BMET    Component Value Date/Time   NA 136 11/12/2022 0356   NA 143 07/14/2022 1006   K 4.1 11/12/2022 0356   CL 105 11/12/2022 0356   CO2 24 11/12/2022 0356   GLUCOSE 108 (H) 11/12/2022 0356   BUN 22 11/12/2022 0356   BUN 18 07/14/2022 1006   CREATININE 2.67 (H) 11/12/2022 0356   CALCIUM 7.3 (L) 11/12/2022 0356   GFRNONAA 18 (L) 11/12/2022 0356   GFRAA 50 (L) 12/13/2020 0913   CBC    Component Value Date/Time   WBC 5.2 11/12/2022 0356   RBC 3.71 (L) 11/12/2022 0356   HGB 9.9 (L) 11/12/2022 0356   HGB 12.0 07/14/2022 1006   HCT 28.7 (L) 11/12/2022 0356   HCT 36.8 07/14/2022 1006   PLT 152 11/12/2022 0356   PLT 274 07/14/2022 1006   MCV 77.4 (L) 11/12/2022 0356   MCV 83 07/14/2022 1006   MCH 26.7 11/12/2022 0356   MCHC 34.5 11/12/2022 0356   RDW 15.1  11/12/2022 0356   RDW 13.4 07/14/2022 1006   LYMPHSABS 1.0 11/08/2022 0622   LYMPHSABS 0.9 07/14/2022 1006   MONOABS 0.4 11/08/2022 0622   EOSABS 0.0 11/08/2022 0622   EOSABS 0.1 07/14/2022 1006   BASOSABS 0.0 11/08/2022 0622   BASOSABS 0.0 07/14/2022 1006    Assessment/Plan: 76 year old WF with profuse diarrhea-  ileitis/enteritis 1.Renal- A on CRF- baseline crt 1.2 to 1.4-  was over 2 upon admission in the setting of volume depletion and cont diarrhea-  improved after fluid given the first day.  Since then -  the ins and outs have not been well recorded- has made up to 4 liters of diarrhea daily and not sure we have kept up with those losses.  I am going to recheck a urine-  still pending.  Could have established ATN at this point so could be slower to resolve. UOP has been improving daily and Scr as well but slowly.  Nothing further to add.  Will sign off.  Please call with questions or concerns.  Will need outpatient follow up with our office if her Scr doesn't reach her baseline in a month.    2. Hypertension/volume  - was having huge amounts of diarrhea still.  Unclear what I's and O's are.   stopped the norvasc-  BP improved and diarrhea stopped for now.  Will decrease IVF's to 50 mL/hr and follow.   May be able to stop IVF's if no further diarrhea.  3. Hypokalemia-  improved-  trying to eat today-  currently on 80 mEq daily and K improved. 4. Anemia  - not an issue -  is decreasing with ivf-  not surprising but not to a critical point 5. Metabolic acidosis  - due to AKI and diarrhea, resolved with isotonic bicarb.  Will decrease rate to 50 mL/hr and follow 6. Hypomagnesemia - s/p IV Mg with improvement 7. Hypophosphatemia - repleted with IV K-phos.  Donetta Potts, MD [facility]

## 2022-11-12 NOTE — Progress Notes (Signed)
PROGRESS NOTE  Rashena Dowling OMV:[id number] DOB: 07/12/1946 DOA: 11/01/2022 PCP: Claretta Fraise, MD  Brief History:  Eleanore Junio is a 76 y.o. female with medical history significant for hypertension, dyslipidemia, anxiety disorder, COPD, GERD, and prior thyroid cancer with resection who presented to the ED with ongoing profuse, watery diarrhea that has been ongoing for the last 1 week.  She was admitted for evaluation of diarrhea with associated severe hypokalemia as well as some AKI related to this.  She is noted to have adenovirus gastroenteritis.  She continues to have ongoing significant diarrhea with nausea and vomiting that requires aggressive IV fluid repletion.  Zofran and Lomotil scheduled.   Assessment/Plan:  Severe hypokalemia secondary to adenovirus Enteritis -hypokalemia improving with improving diarrhea -Continue aggressive IV fluid repletion -C. difficile negative -advanced to soft foods diet  -Imodium as needed -Scheduled Lomotil and Zofran 12/19, discussed case with GI with recommendations to continue the same and add probiotics -continue aggressive supportive measures -requested GI consult and appreciate assistance -Pt started on cholestyramine 4 gm BID by GI service  -diarrhea seems to be resolved now;  -discontinued scheduled lomotil; DC imodium;  -potassium has been repleted;     Severe hypophosphatemia -kphos replacement ordered by pharm D on 12/26 -recheck phos--2.6   AKI on CKD stage IIIa -Appears prerenal in the setting of ongoing diarrhea -Aggressive IV fluid ongoing, increase fluid rate to 150 cc/h initially -now IVF at 50cc/hr -Avoid nephrotoxic agents -worsening creatinine noted -consulted to nephrology on 12/21  -increased hydration and suspect ATN  -slowly improving with treatments   Mild hyponatremia-resolved -Likely related to dehydration, monitor on normal saline infusion -Also uses HCTZ which has been held at this time    Hypomagnesemia -IV Mg ordered and repleted    Anal Fissure -sitz bath daily x 20 mins -add fiber when ok with GI team  -shower after every bowel movement  -hydrate  -ask gen surgery to see   Dyslipidemia -Continue home medications   Hypertension -Hold home HCTZ    GERD -Continue on PPI   History of COPD with prior tobacco abuse -Continue Breo -Rescue inhalers as needed   Obesity -BMI 62.83   Metabolic acidosis - nephrology started on sodium bicarb infusion starting 12/24 with improvement in CO2 from 10 --> 14 -->23     Family Communication: no  Family at bedside  Consultants:  GI, renal, general surgery  Code Status:  FULL   DVT Prophylaxis:  [place] Heparin   Procedures: As Listed in Progress Note Above  Antibiotics: None      Subjective: Diarrhea improving.  2 BMs today.  Denies n/v/ cp, sob, f/c  Objective: Vitals:   11/11/22 2010 11/12/22 0341 11/12/22 0854 11/12/22 1559  BP: 138/66 137/61 (!) 140/63 (!) 157/81  Pulse: 88 83 85 93  Resp: 18 16 17 20   Temp: 98.4 F (36.9 C) 98.5 F (36.9 C) 98.4 F (36.9 C) 98.9 F (37.2 C)  TempSrc: Oral Oral Oral Oral  SpO2: 98% 96% 95% 97%  Weight:      Height:        Intake/Output Summary (Last 24 hours) at 11/12/2022 1754 Last data filed at 11/12/2022 1626 Gross per 24 hour  Intake 1423.69 ml  Output 1300 ml  Net 123.69 ml   Weight change:  Exam:  General:  Pt is alert, follows commands appropriately, not in acute distress HEENT: No icterus, No thrush, No neck mass, [place]/AT Cardiovascular: RRR,  S1/S2, no rubs, no gallops Respiratory: CTA bilaterally, no wheezing, no crackles, no rhonchi Abdomen: Soft/+BS, non tender, non distended, no guarding Extremities: trace LE edema, No lymphangitis, No petechiae, No rashes, no synovitis   Data Reviewed: I have personally reviewed following labs and imaging studies Basic Metabolic Panel: Recent Labs  Lab 11/06/22 0330 11/07/22 0332 11/07/22 1515  11/08/22 0622 11/09/22 0414 11/10/22 0503 11/11/22 0401 11/12/22 0356  NA 136   < > 136 138 134* 134* 134* 136  K 3.2*   < > 3.6 3.3* 3.7 3.9 3.7 4.1  CL 113*   < > 118* 121* 118* 115* 106 105  CO2 14*   < > 12* 11* 10* 14* 23 24  GLUCOSE 116*   < > 123* 114* 127* 123* 150* 108*  BUN 37*   < > 31* 25* 23 25* 26* 22  CREATININE 3.22*   < > 3.28* 2.91* 2.89* 2.75* 2.83* 2.67*  CALCIUM 8.1*   < > 7.7* 7.4* 7.2* 7.3* 7.2* 7.3*  MG 2.1  --   --   --  1.3* 2.0 1.7  --   PHOS  --   --  1.8*  --   --   --  <1.0* 2.6   < > = values in this interval not displayed.   Liver Function Tests: Recent Labs  Lab 11/07/22 1515  ALBUMIN 2.6*   No results for input(s): "LIPASE", "AMYLASE" in the last 168 hours. No results for input(s): "AMMONIA" in the last 168 hours. Coagulation Profile: No results for input(s): "INR", "PROTIME" in the last 168 hours. CBC: Recent Labs  Lab 11/06/22 0330 11/07/22 0332 11/08/22 0622 11/12/22 0356  WBC 11.0* 9.3 10.4 5.2  NEUTROABS 7.0 6.4 8.8*  --   HGB 12.4 10.9* 11.3* 9.9*  HCT 37.6 32.3* 33.0* 28.7*  MCV 80.5 79.0* 79.9* 77.4*  PLT 259 216 170 152   Cardiac Enzymes: No results for input(s): "CKTOTAL", "CKMB", "CKMBINDEX", "TROPONINI" in the last 168 hours. BNP: Invalid input(s): "POCBNP" CBG: No results for input(s): "GLUCAP" in the last 168 hours. HbA1C: No results for input(s): "HGBA1C" in the last 72 hours. Urine analysis:    Component Value Date/Time   COLORURINE STRAW (A) 11/11/2022 1330   APPEARANCEUR CLEAR 11/11/2022 1330   APPEARANCEUR Clear 02/08/2018 1042   LABSPEC 1.005 11/11/2022 1330   PHURINE 5.0 11/11/2022 1330   GLUCOSEU 50 (A) 11/11/2022 1330   HGBUR MODERATE (A) 11/11/2022 1330   BILIRUBINUR NEGATIVE 11/11/2022 1330   BILIRUBINUR Negative 02/08/2018 1042   KETONESUR NEGATIVE 11/11/2022 1330   PROTEINUR NEGATIVE 11/11/2022 1330   UROBILINOGEN negative 04/03/2015 1039   NITRITE NEGATIVE 11/11/2022 1330   LEUKOCYTESUR  NEGATIVE 11/11/2022 1330   Sepsis Labs: @LABRCNTIP (procalcitonin:4,lacticidven:4) )No results found for this or any previous visit (from the past 240 hour(s)).   Scheduled Meds:  anastrozole  1 mg Oral q AM   cholestyramine  4 g Oral BID   famotidine  10 mg Oral Daily   fluticasone furoate-vilanterol  1 puff Inhalation Once per day on Sun Tue Thu Sat   heparin  5,000 Units Subcutaneous Q8H   melatonin  9 mg Oral QHS   ondansetron (ZOFRAN) IV  4 mg Intravenous Q6H   potassium chloride  40 mEq Oral BID   raloxifene  60 mg Oral Daily   rosuvastatin  20 mg Oral Daily   saccharomyces boulardii  250 mg Oral BID   traZODone  100 mg Oral QHS   Continuous Infusions:  sodium bicarbonate 150 mEq in sterile water 1,150 mL infusion 50 mL/hr at 11/12/22 0400    Procedures/Studies: DG Abd 1 View  Result Date: 11/11/2022 CLINICAL DATA:  Abdominal pain. EXAM: ABDOMEN - 1 VIEW COMPARISON:  CT abdomen and pelvis 11/01/2022 FINDINGS: The upper abdomen was incompletely imaged. Gas is present in loops of small bowel throughout the abdomen without frank bowel dilatation to indicate mechanical obstruction. No abnormal abdominal or pelvic calcification is identified. There is asymmetrically severe left hip osteoarthrosis. IMPRESSION: No evidence of bowel obstruction. Electronically Signed   By: Logan Bores M.D.   On: 11/11/2022 08:38   CT ABDOMEN PELVIS WO CONTRAST  Result Date: 11/01/2022 CLINICAL DATA:  Patient with a recently diagnosed non-small cell lung cancer right upper lobe, presents to the ED with nonlocalized acute abdomen pain and diarrhea since 10/25/2022. EXAM: CT ABDOMEN AND PELVIS WITHOUT CONTRAST TECHNIQUE: Multidetector CT imaging of the abdomen and pelvis was performed following the standard protocol without IV contrast. RADIATION DOSE REDUCTION: This exam was performed according to the departmental dose-optimization program which includes automated exposure control, adjustment of the mA  and/or kV according to patient size and/or use of iterative reconstruction technique. COMPARISON:  Chest CT without contrast 09/09/2022, and previous chest CTs dating back to 03/03/2022, as well as PET-CT 04/30/2022. FINDINGS: Lower chest: Stable 1 cm right lower lobe ground-glass nodule on 4:21. Scattered linear scarring or atelectasis both lower lobes. No acute process. The cardiac size is normal. Small stable pericardial effusion again noted and small hiatal hernia. Hepatobiliary: Capsular nodularity along the left lobe is noted consistent with cirrhosis. There is no dilatation of the hepatic portal main vein. The gallbladder is mildly distended without calcified stones, wall thickening or biliary dilatation. The unenhanced liver disease not show evidence of a mass. There is mild steatosis with hepatic length measurement 17 cm. Pancreas: There is moderate partial pancreatic atrophy. The unenhanced pancreas otherwise unremarkable without contrast. Spleen: Unremarkable without contrast.  No splenomegaly. Adrenals/Urinary Tract: There is no adrenal mass. Right kidney demonstrates a homogeneous thin walled 2 cm cyst in the outer mid to lower pole cortex, Hounsfield density of 12.4. There is an 8 mm moderately dense exophytic lesion in the superior pole of this kidney which is too small to characterize. Both unenhanced kidneys are otherwise unremarkable. No follow-up imaging is recommended. For reference see JACR 2018 Feb; 264-273, Management of the Incidental Renal Mass on CT, [employer] 2021; 814-848, Bosniak Classification of Cystic Renal Masses, Version 2019. There is no urinary stone or obstruction. The bladder is unremarkable allowing for the degree of distention. Stomach/Bowel: Small hiatal hernia. The gastric wall is contracted. There is no small bowel dilatation but there is fluid filling of normal caliber small bowel in the mid to lower abdomen with fluid levels compatible with ileus/enteritis There is no  small bowel obstruction or inflammatory change. An appendix is not seen in this patient. There is fluid in the colon as well, but again no wall thickening. There are uncomplicated sigmoid diverticula. Vascular/Lymphatic: Aortic atherosclerosis. No enlarged abdominal or pelvic lymph nodes. Reproductive: The uterus is intact. There are small calcified uterine fibroids. No adnexal mass is seen. Other: There is no free air, free hemorrhage, free fluid or incarcerated hernia. There are small inguinal fat hernias. Musculoskeletal: There is lumbar facet hypertrophy greatest at L4-5 where there is grade 1 degenerative anterolisthesis. Osteopenia. No acute or other significant osseous findings. Asymmetrically advanced left hip DJD. IMPRESSION: 1. Fluid in the small bowel and colon  consistent with ileus/enteritis. No bowel obstruction or inflammatory change. 2. Diverticulosis without evidence of diverticulitis. 3. Hepatic cirrhosis with mild steatosis. No ascites, portal vein dilatation or splenomegaly. 4. Aortic atherosclerosis. 5. Stable 1 cm right lower lobe ground-glass nodule. 6. Small hiatal hernia.  Small inguinal fat hernias. 7. Osteopenia and degenerative change. Aortic Atherosclerosis (ICD10-I70.0). Electronically Signed   By: Telford Nab M.D.   On: 11/01/2022 05:52    Orson Eva, DO  [facility]  If 7PM-7AM, please contact night-coverage [email or web address] Password [email or web address] 11/12/2022, 5:54 PM   LOS: 11 days

## 2022-11-13 ENCOUNTER — Telehealth: Payer: Self-pay | Admitting: Gastroenterology

## 2022-11-13 ENCOUNTER — Encounter: Payer: Self-pay | Admitting: Internal Medicine

## 2022-11-13 DIAGNOSIS — E876 Hypokalemia: Secondary | ICD-10-CM | POA: Diagnosis not present

## 2022-11-13 DIAGNOSIS — N179 Acute kidney failure, unspecified: Secondary | ICD-10-CM | POA: Diagnosis not present

## 2022-11-13 DIAGNOSIS — K529 Noninfective gastroenteritis and colitis, unspecified: Secondary | ICD-10-CM | POA: Diagnosis not present

## 2022-11-13 DIAGNOSIS — K602 Anal fissure, unspecified: Secondary | ICD-10-CM

## 2022-11-13 LAB — PHOSPHORUS: Phosphorus: 3.2 mg/dL (ref 2.5–4.6)

## 2022-11-13 LAB — BASIC METABOLIC PANEL
Anion gap: 6 (ref 5–15)
BUN: 23 mg/dL (ref 8–23)
CO2: 28 mmol/L (ref 22–32)
Calcium: 7.5 mg/dL — ABNORMAL LOW (ref 8.9–10.3)
Chloride: 102 mmol/L (ref 98–111)
Creatinine, Ser: 2.62 mg/dL — ABNORMAL HIGH (ref 0.44–1.00)
GFR, Estimated: 18 mL/min — ABNORMAL LOW (ref >60)
Glucose, Bld: 111 mg/dL — ABNORMAL HIGH (ref 70–99)
Potassium: 4 mmol/L (ref 3.5–5.1)
Sodium: 136 mmol/L (ref 135–145)

## 2022-11-13 LAB — CBC
HCT: 28.1 % — ABNORMAL LOW (ref 36.0–46.0)
Hemoglobin: 9.4 g/dL — ABNORMAL LOW (ref 12.0–15.0)
MCH: 26.8 pg (ref 26.0–34.0)
MCHC: 33.5 g/dL (ref 30.0–36.0)
MCV: 80.1 fL (ref 80.0–100.0)
Platelets: 144 10*3/uL — ABNORMAL LOW (ref 150–400)
RBC: 3.51 MIL/uL — ABNORMAL LOW (ref 3.87–5.11)
RDW: 15.4 % (ref 11.5–15.5)
WBC: 4.6 10*3/uL (ref 4.0–10.5)
nRBC: 0 % (ref 0.0–0.2)

## 2022-11-13 LAB — MAGNESIUM: Magnesium: 1.7 mg/dL (ref 1.7–2.4)

## 2022-11-13 MED ORDER — LACTATED RINGERS IV SOLN: INTRAVENOUS | Status: DC

## 2022-11-13 MED ORDER — HYDROCORTISONE 2.5 % EX CREA: TOPICAL_CREAM | Freq: Four times a day (QID) | CUTANEOUS | 0 refills | Status: DC

## 2022-11-13 MED ORDER — OXYCODONE HCL 5 MG PO TABS: 2.5000 mg | ORAL_TABLET | Freq: Once | ORAL | Status: DC

## 2022-11-13 MED ORDER — HYDROCORTISONE ACETATE 25 MG RE SUPP
25.0000 mg | Freq: Two times a day (BID) | RECTAL | Status: DC
  Administered 2022-11-13 – 2022-11-15 (×5): 25 mg via RECTAL
  Filled 2022-11-13 (×5): qty 1

## 2022-11-13 NOTE — Telephone Encounter (Signed)
Patient needs hospital follow up for new cirrhosis, diarrhea in 4-6 weeks. Please arrange for Dr. Abbey Chatters or APP.

## 2022-11-13 NOTE — Consult Note (Signed)
[facility] Consult  Reason for Consult: Perianal abscess versus anal fissure Referring Physician: Dr. Jenetta Downer  Chief Complaint   Diarrhea and Vomiting      HPI: Renee Maldonado is a 76 y.o. female who was admitted with hypokalemia secondary to adenovirus enteritis and significant diarrhea.  She has been in the hospital for almost 2 weeks with issues related to her electrolytes after significant diarrhea.  Yesterday, she began complaining of increased pain at her rectum after bowel movements.  She was noted to have concern for ulceration/anal fissure versus perianal abscess at the 6 o'clock position.  She has been tolerating a diet without nausea and vomiting, and her bowel movements have slowly started to decrease in frequency.  Her past medical history is significant for COPD, GERD, hypertension, hyperlipidemia, and thyroid cancer.  Her abdominal surgical history is significant for tubal ligation.  Today, she feels that she is doing pretty well.  She denies significant pain in her anus, mostly complains of itching and feeling like she has to have a bowel movement.  Past Medical History:  Diagnosis Date   Anxiety    Arthritis    Cancer ([place])    breast Right no chemo or radiation  on Arimidex   COPD (chronic obstructive pulmonary disease) ([place])    on Breo   GERD (gastroesophageal reflux disease)    Humerus fracture 2007   Hyperlipidemia    Hypertension    Insomnia    OA (osteoarthritis) of knee 10/08/2020   Thyroid cancer ([place]) 2023    Past Surgical History:  Procedure Laterality Date   BREAST LUMPECTOMY Right 02/2019   BREAST SURGERY Right 01/2019   breast biopsy   BRONCHIAL BIOPSY  04/15/2022   Procedure: BRONCHIAL BIOPSIES;  Surgeon: Garner Nash, DO;  Location: [place];  Service: Pulmonary;;   BRONCHIAL BRUSHINGS  04/15/2022   Procedure: BRONCHIAL BRUSHINGS;  Surgeon: Garner Nash, DO;  Location: [place];  Service: Pulmonary;;    BRONCHIAL NEEDLE ASPIRATION BIOPSY  04/15/2022   Procedure: BRONCHIAL NEEDLE ASPIRATION BIOPSIES;  Surgeon: Garner Nash, DO;  Location: [place] [facility];  Service: Pulmonary;;   COLONOSCOPY  2022   FIDUCIAL MARKER PLACEMENT  04/15/2022   Procedure: FIDUCIAL MARKER PLACEMENT;  Surgeon: Garner Nash, DO;  Location: [place] [facility];  Service: Pulmonary;;   JOINT REPLACEMENT     left knee   Left shoulder surgery     2007   RADICAL NECK DISSECTION Right 10/10/2022   Procedure: CENTRAL NECK DISSECTION;  Surgeon: Jenetta Downer, MD;  Location: [facility];  Service: ENT;  Laterality: Right;   THYROIDECTOMY Right 10/10/2022   Procedure: RIGHT THYROID LOBECTOMY;  Surgeon: Jenetta Downer, MD;  Location: [place];  Service: ENT;  Laterality: Right;   TOTAL KNEE ARTHROPLASTY     2012 left   TOTAL KNEE ARTHROPLASTY Right 10/08/2020   Procedure: TOTAL KNEE ARTHROPLASTY;  Surgeon: Gaynelle Arabian, MD;  Location: [facility];  Service: Orthopedics;  Laterality: Right;   TUBAL LIGATION  1978   VIDEO BRONCHOSCOPY WITH RADIAL ENDOBRONCHIAL ULTRASOUND  04/15/2022   Procedure: VIDEO BRONCHOSCOPY WITH RADIAL ENDOBRONCHIAL ULTRASOUND;  Surgeon: Garner Nash, DO;  Location: [facility];  Service: Pulmonary;;    Family History  Problem Relation Age of Onset   Alzheimer's disease Mother    Diabetes Mother    Stroke Father    Hypertension Father    Cancer Brother     Social History   Tobacco Use   Smoking status: Former  Packs/day: 0.25    Years: 40.00    Total pack years: 10.00    Types: Cigarettes    Quit date: 09/17/2014    Years since quitting: 8.1   Smokeless tobacco: Never   Tobacco comments:    smokes 3 a day  Vaping Use   Vaping Use: Former   Start date: 07/18/2018   Quit date: 07/19/2019  Substance Use Topics   Alcohol use: Yes    Alcohol/week: 4.0 standard drinks of alcohol    Types: 4 Standard drinks or equivalent per week   Drug use: No    Medications: I have reviewed the patient's  current medications.  Allergies  Allergen Reactions   Benadryl [Diphenhydramine Hcl] Nausea Only    Dizziness   Penicillins     Had a rash around 20 years ago but unsure if it come from medicine.   Tolerated Cephalosporin Date: 10/09/20.       ROS:  Pertinent items are noted in HPI.  Blood pressure (!) 137/58, pulse 86, temperature 99 F (37.2 C), temperature source Oral, resp. rate 18, height 5\' 1"  (1.549 m), weight 74.7 kg, SpO2 96 %. Physical Exam Vitals reviewed.  Constitutional:      Appearance: Normal appearance.  HENT:     Head: Normocephalic and atraumatic.  Eyes:     Extraocular Movements: Extraocular movements intact.     Pupils: Pupils are equal, round, and reactive to light.  Cardiovascular:     Rate and Rhythm: Normal rate.  Pulmonary:     Effort: Pulmonary effort is normal.  Abdominal:     General: There is no distension.     Palpations: Abdomen is soft.     Tenderness: There is no abdominal tenderness.  Genitourinary:    Comments: Anus with less than half centimeter ulceration at the 6 o'clock position, with overlying fibrinous exudate, no surrounding induration, erythema, or tenderness, no purulent drainage noted Musculoskeletal:        General: Normal range of motion.     Cervical back: Normal range of motion.  Skin:    General: Skin is warm and dry.  Neurological:     General: No focal deficit present.     Mental Status: She is alert and oriented to person, place, and time.  Psychiatric:        Mood and Affect: Mood normal.        Behavior: Behavior normal.     Results: Results for orders placed or performed during the hospital encounter of 11/01/22 (from the past 48 hour(s))  Urinalysis, Routine w reflex microscopic Urine, Clean Catch     Status: Abnormal   Collection Time: 11/11/22  1:30 PM  Result Value Ref Range   Color, Urine STRAW (A) YELLOW   APPearance CLEAR CLEAR   Specific Gravity, Urine 1.005 1.005 - 1.030   pH 5.0 5.0 - 8.0    Glucose, UA 50 (A) NEGATIVE mg/dL   Hgb urine dipstick MODERATE (A) NEGATIVE   Bilirubin Urine NEGATIVE NEGATIVE   Ketones, ur NEGATIVE NEGATIVE mg/dL   Protein, ur NEGATIVE NEGATIVE mg/dL   Nitrite NEGATIVE NEGATIVE   Leukocytes,Ua NEGATIVE NEGATIVE   RBC / HPF 0-5 0 - 5 RBC/hpf   WBC, UA 0-5 0 - 5 WBC/hpf   Bacteria, UA RARE (A) NONE SEEN   Squamous Epithelial / LPF 0-5 0 - 5 /LPF   Mucus PRESENT     Comment: Performed at [facility], [address], [place], [place] [address]  Basic metabolic  panel     Status: Abnormal   Collection Time: 11/12/22  3:56 AM  Result Value Ref Range   Sodium 136 135 - 145 mmol/L   Potassium 4.1 3.5 - 5.1 mmol/L   Chloride 105 98 - 111 mmol/L   CO2 24 22 - 32 mmol/L   Glucose, Bld 108 (H) 70 - 99 mg/dL    Comment: Glucose reference range applies only to samples taken after fasting for at least 8 hours.   BUN 22 8 - 23 mg/dL   Creatinine, Ser 2.67 (H) 0.44 - 1.00 mg/dL   Calcium 7.3 (L) 8.9 - 10.3 mg/dL   GFR, Estimated 18 (L) >60 mL/min    Comment: (NOTE) Calculated using the CKD-EPI Creatinine Equation (2021)    Anion gap 7 5 - 15    Comment: Performed at [facility], [address] [place], [place] [address]  Phosphorus     Status: None   Collection Time: 11/12/22  3:56 AM  Result Value Ref Range   Phosphorus 2.6 2.5 - 4.6 mg/dL    Comment: Performed at [facility], [address], [place], [place] [address]  CBC     Status: Abnormal   Collection Time: 11/12/22  3:56 AM  Result Value Ref Range   WBC 5.2 4.0 - 10.5 K/uL   RBC 3.71 (L) 3.87 - 5.11 MIL/uL   Hemoglobin 9.9 (L) 12.0 - 15.0 g/dL   HCT 28.7 (L) 36.0 - 46.0 %   MCV 77.4 (L) 80.0 - 100.0 fL   MCH 26.7 26.0 - 34.0 pg   MCHC 34.5 30.0 - 36.0 g/dL   RDW 15.1 11.5 - 15.5 %   Platelets 152 150 - 400 K/uL   nRBC 0.0 0.0 - 0.2 %    Comment: Performed at [facility], [address] [place], [place] [address]  Basic metabolic panel     Status: Abnormal   Collection Time: 11/13/22   3:40 AM  Result Value Ref Range   Sodium 136 135 - 145 mmol/L   Potassium 4.0 3.5 - 5.1 mmol/L   Chloride 102 98 - 111 mmol/L   CO2 28 22 - 32 mmol/L   Glucose, Bld 111 (H) 70 - 99 mg/dL    Comment: Glucose reference range applies only to samples taken after fasting for at least 8 hours.   BUN 23 8 - 23 mg/dL   Creatinine, Ser 2.62 (H) 0.44 - 1.00 mg/dL   Calcium 7.5 (L) 8.9 - 10.3 mg/dL   GFR, Estimated 18 (L) >60 mL/min    Comment: (NOTE) Calculated using the CKD-EPI Creatinine Equation (2021)    Anion gap 6 5 - 15    Comment: Performed at [facility], [address], [place], [place] [address]  Magnesium     Status: None   Collection Time: 11/13/22  3:40 AM  Result Value Ref Range   Magnesium 1.7 1.7 - 2.4 mg/dL    Comment: Performed at [facility], [address], [place],  [address]  Phosphorus     Status: None   Collection Time: 11/13/22  3:40 AM  Result Value Ref Range   Phosphorus 3.2 2.5 - 4.6 mg/dL    Comment: Performed at [facility], [address] [place],  [address]  CBC     Status: Abnormal   Collection Time: 11/13/22  3:40 AM  Result Value Ref Range   WBC 4.6 4.0 - 10.5 K/uL   RBC 3.51 (L) 3.87 - 5.11 MIL/uL  Hemoglobin 9.4 (L) 12.0 - 15.0 g/dL   HCT 28.1 (L) 36.0 - 46.0 %   MCV 80.1 80.0 - 100.0 fL   MCH 26.8 26.0 - 34.0 pg   MCHC 33.5 30.0 - 36.0 g/dL   RDW 15.4 11.5 - 15.5 %   Platelets 144 (L) 150 - 400 K/uL   nRBC 0.0 0.0 - 0.2 %    Comment: Performed at [facility], [address] [place], [place] [address]    No results found.   Assessment & Plan:  Renee Maldonado is a 76 y.o. female who was admitted with hypokalemia secondary to adenovirus enteritis.  General surgery consulted to evaluate for anal fissure/ulceration versus perianal abscess. Blood work evaluate by myself.  -Based on physical exam, it appears the patient has a small ulceration at the 6 o'clock position.  This probably occurred secondary to her numerous bowel  movements over the last 2 weeks and irritation to the area -I do not feel anything consistent with an abscess, and she has no leukocytosis -Recommend nitroglycerin compounded cream to apply to the area of concern 4 times a day.  I have contacted pharmacy to see if we are able to obtain this cream -If we are unable to obtain this compounded cream inpatient, will recommend Anusol suppositories twice daily, and then we will recommend she obtain a hemorrhoid cream compounded with nitroglycerin as an outpatient -Recommend using wet wipes after bowel movements and sitz baths -No acute surgical intervention at this time -Patient will need to follow up with me in 3-4 weeks after discharge to evaluate how this area is healing -Appreciate hospitalist recommendations  All questions were answered to the satisfaction of the patient.   -- Graciella Freer, DO [facility] [address] [place], [place] [address] [phone] (office)

## 2022-11-13 NOTE — Progress Notes (Signed)
Brief chart review: admitted with diarrhea secondary to adenovirus. She has had significant improvement in her diarrhea. Biggest concern is rectal pain and development of perirectal abscess. General surgery consult pending.   Recommend decreasing dose of questran when stool frequency further decreases and consistency improves to avoid constipation. She is no longer requiring imodium or lomotil.   We will make arrangements for outpatient follow up.   GI to sign off, call with questions.   Laureen Ochs. Bernarda Caffey [facility] [phone] 12/28/202310:52 AM

## 2022-11-13 NOTE — Progress Notes (Signed)
PROGRESS NOTE  Renee Maldonado OZH:[id number] DOB: 1946-08-26 DOA: 11/01/2022 PCP: Claretta Fraise, MD   Brief History:  Renee Maldonado is a 76 y.o. female with medical history significant for hypertension, dyslipidemia, anxiety disorder, COPD, GERD, and prior thyroid cancer with resection who presented to the ED with ongoing profuse, watery diarrhea that has been ongoing for the last 1 week.  She was admitted for evaluation of diarrhea with associated severe hypokalemia as well as some AKI related to this.  She is noted to have adenovirus gastroenteritis.  She continues to have ongoing significant diarrhea with nausea and vomiting that requires aggressive IV fluid repletion.  Zofran and Lomotil scheduled.  Her hospitalization has been prolonged due to persistent diarrhea for which GI was consulted.  She also developed acute on chronic renal failure for which renal was consulted.  This was felt to be due to ATN/hemodynamic changes due to the patient's diarrhea.  Questran was added and Lomotil was discontinued. The patient's diarrhea ultimately slowed down and improved, and she began having formed stool.     Assessment/Plan:  Severe hypokalemia secondary to adenovirus Enteritis -hypokalemia improving with improving diarrhea -Continue IV fluid repletion -C. difficile negative -advanced to soft foods diet which pt is tolerating -Imodium as needed -Scheduled Lomotil and Zofran 12/19, discussed case with GI with recommendations to continue the same and add probiotics -continue aggressive supportive measures -requested GI consult and appreciate assistance -Pt started on cholestyramine 4 gm BID by GI service  -diarrhea seems to be resolved now;  -discontinued scheduled lomotil; DC imodium;  -potassium has been repleted;     Severe hypophosphatemia -kphos replacement ordered by pharm D on 12/26 -recheck phos--2.6   AKI on CKD stage IIIa -due to volume depletion/ATN in the setting of  ongoing diarrhea -Aggressive IV fluid ongoing, increase fluid rate to 150 cc/h initially -now IVF at 50cc/hr -Avoid nephrotoxic agents -consulted to nephrology on 12/21  -increased hydration and suspect ATN  -slowly improving with treatments   Mild hyponatremia-resolved -Likely related to dehydration, monitor on normal saline infusion -Also uses HCTZ which has been held at this time   Hypomagnesemia -IV Mg ordered and repleted    Anal Fissure/ulcer -sitz bath daily x 20 mins -appreciate Dr. Okey Dupre input>>anusol for now -good hygiene   Dyslipidemia -Continue home medications   Hypertension -Hold home HCTZ    GERD -Continue on PPI   History of COPD with prior tobacco abuse -Continue Breo -Rescue inhalers as needed   Obesity -BMI 62.95   Metabolic acidosis - nephrology started on sodium bicarb infusion starting 12/24 with improvement in CO2 from 10 --> 14 -->23 -d/c bicarb drip         Family Communication: no  Family at bedside   Consultants:  GI, renal, general surgery   Code Status:  FULL    DVT Prophylaxis:  [place] Heparin    Procedures: As Listed in Progress Note Above   Antibiotics: None      Subjective: Had 2 firm stools in last 24 hours.  Denies cp, sob, n/v/d, abd pain  Objective: Vitals:   11/13/22 0421 11/13/22 0755 11/13/22 0901 11/13/22 1429  BP: 131/64  (!) 137/58 136/66  Pulse: 79  86 87  Resp: 18  18 18   Temp: 98.1 F (36.7 C)  99 F (37.2 C) 98.2 F (36.8 C)  TempSrc:   Oral Oral  SpO2: 94% 95% 96% 96%  Weight:  Height:        Intake/Output Summary (Last 24 hours) at 11/13/2022 1527 Last data filed at 11/13/2022 1130 Gross per 24 hour  Intake --  Output 1875 ml  Net -1875 ml   Weight change:  Exam:  General:  Pt is alert, follows commands appropriately, not in acute distress HEENT: No icterus, No thrush, No neck mass, [place]/AT Cardiovascular: RRR, S1/S2, no rubs, no gallops Respiratory: CTA bilaterally, no  wheezing, no crackles, no rhonchi Abdomen: Soft/+BS, non tender, non distended, no guarding Extremities: 1 + LE edema, No lymphangitis, No petechiae, No rashes, no synovitis   Data Reviewed: I have personally reviewed following labs and imaging studies Basic Metabolic Panel: Recent Labs  Lab 11/07/22 1515 11/08/22 0622 11/09/22 0414 11/10/22 0503 11/11/22 0401 11/12/22 0356 11/13/22 0340  NA 136   < > 134* 134* 134* 136 136  K 3.6   < > 3.7 3.9 3.7 4.1 4.0  CL 118*   < > 118* 115* 106 105 102  CO2 12*   < > 10* 14* 23 24 28   GLUCOSE 123*   < > 127* 123* 150* 108* 111*  BUN 31*   < > 23 25* 26* 22 23  CREATININE 3.28*   < > 2.89* 2.75* 2.83* 2.67* 2.62*  CALCIUM 7.7*   < > 7.2* 7.3* 7.2* 7.3* 7.5*  MG  --   --  1.3* 2.0 1.7  --  1.7  PHOS 1.8*  --   --   --  <1.0* 2.6 3.2   < > = values in this interval not displayed.   Liver Function Tests: Recent Labs  Lab 11/07/22 1515  ALBUMIN 2.6*   No results for input(s): "LIPASE", "AMYLASE" in the last 168 hours. No results for input(s): "AMMONIA" in the last 168 hours. Coagulation Profile: No results for input(s): "INR", "PROTIME" in the last 168 hours. CBC: Recent Labs  Lab 11/07/22 0332 11/08/22 0622 11/12/22 0356 11/13/22 0340  WBC 9.3 10.4 5.2 4.6  NEUTROABS 6.4 8.8*  --   --   HGB 10.9* 11.3* 9.9* 9.4*  HCT 32.3* 33.0* 28.7* 28.1*  MCV 79.0* 79.9* 77.4* 80.1  PLT 216 170 152 144*   Cardiac Enzymes: No results for input(s): "CKTOTAL", "CKMB", "CKMBINDEX", "TROPONINI" in the last 168 hours. BNP: Invalid input(s): "POCBNP" CBG: No results for input(s): "GLUCAP" in the last 168 hours. HbA1C: No results for input(s): "HGBA1C" in the last 72 hours. Urine analysis:    Component Value Date/Time   COLORURINE STRAW (A) 11/11/2022 1330   APPEARANCEUR CLEAR 11/11/2022 1330   APPEARANCEUR Clear 02/08/2018 1042   LABSPEC 1.005 11/11/2022 1330   PHURINE 5.0 11/11/2022 1330   GLUCOSEU 50 (A) 11/11/2022 1330   HGBUR  MODERATE (A) 11/11/2022 1330   BILIRUBINUR NEGATIVE 11/11/2022 1330   BILIRUBINUR Negative 02/08/2018 1042   KETONESUR NEGATIVE 11/11/2022 1330   PROTEINUR NEGATIVE 11/11/2022 1330   UROBILINOGEN negative 04/03/2015 1039   NITRITE NEGATIVE 11/11/2022 1330   LEUKOCYTESUR NEGATIVE 11/11/2022 1330   Sepsis Labs: @LABRCNTIP (procalcitonin:4,lacticidven:4) )No results found for this or any previous visit (from the past 240 hour(s)).   Scheduled Meds:  anastrozole  1 mg Oral q AM   cholestyramine  4 g Oral BID   famotidine  10 mg Oral Daily   fluticasone furoate-vilanterol  1 puff Inhalation Once per day on Sun Tue Thu Sat   heparin  5,000 Units Subcutaneous Q8H   hydrocortisone  25 mg Rectal BID   melatonin  9  mg Oral QHS   ondansetron (ZOFRAN) IV  4 mg Intravenous Q6H   oxyCODONE  2.5 mg Oral Once   potassium chloride  40 mEq Oral Daily   raloxifene  60 mg Oral Daily   rosuvastatin  20 mg Oral Daily   saccharomyces boulardii  250 mg Oral BID   traZODone  100 mg Oral QHS   Continuous Infusions:  lactated ringers 50 mL/hr at 11/13/22 1308    Procedures/Studies: DG Abd 1 View  Result Date: 11/11/2022 CLINICAL DATA:  Abdominal pain. EXAM: ABDOMEN - 1 VIEW COMPARISON:  CT abdomen and pelvis 11/01/2022 FINDINGS: The upper abdomen was incompletely imaged. Gas is present in loops of small bowel throughout the abdomen without frank bowel dilatation to indicate mechanical obstruction. No abnormal abdominal or pelvic calcification is identified. There is asymmetrically severe left hip osteoarthrosis. IMPRESSION: No evidence of bowel obstruction. Electronically Signed   By: Logan Bores M.D.   On: 11/11/2022 08:38   CT ABDOMEN PELVIS WO CONTRAST  Result Date: 11/01/2022 CLINICAL DATA:  Patient with a recently diagnosed non-small cell lung cancer right upper lobe, presents to the ED with nonlocalized acute abdomen pain and diarrhea since 10/25/2022. EXAM: CT ABDOMEN AND PELVIS WITHOUT  CONTRAST TECHNIQUE: Multidetector CT imaging of the abdomen and pelvis was performed following the standard protocol without IV contrast. RADIATION DOSE REDUCTION: This exam was performed according to the departmental dose-optimization program which includes automated exposure control, adjustment of the mA and/or kV according to patient size and/or use of iterative reconstruction technique. COMPARISON:  Chest CT without contrast 09/09/2022, and previous chest CTs dating back to 03/03/2022, as well as PET-CT 04/30/2022. FINDINGS: Lower chest: Stable 1 cm right lower lobe ground-glass nodule on 4:21. Scattered linear scarring or atelectasis both lower lobes. No acute process. The cardiac size is normal. Small stable pericardial effusion again noted and small hiatal hernia. Hepatobiliary: Capsular nodularity along the left lobe is noted consistent with cirrhosis. There is no dilatation of the hepatic portal main vein. The gallbladder is mildly distended without calcified stones, wall thickening or biliary dilatation. The unenhanced liver disease not show evidence of a mass. There is mild steatosis with hepatic length measurement 17 cm. Pancreas: There is moderate partial pancreatic atrophy. The unenhanced pancreas otherwise unremarkable without contrast. Spleen: Unremarkable without contrast.  No splenomegaly. Adrenals/Urinary Tract: There is no adrenal mass. Right kidney demonstrates a homogeneous thin walled 2 cm cyst in the outer mid to lower pole cortex, Hounsfield density of 12.4. There is an 8 mm moderately dense exophytic lesion in the superior pole of this kidney which is too small to characterize. Both unenhanced kidneys are otherwise unremarkable. No follow-up imaging is recommended. For reference see JACR 2018 Feb; 264-273, Management of the Incidental Renal Mass on CT, [employer] 2021; 814-848, Bosniak Classification of Cystic Renal Masses, Version 2019. There is no urinary stone or obstruction. The  bladder is unremarkable allowing for the degree of distention. Stomach/Bowel: Small hiatal hernia. The gastric wall is contracted. There is no small bowel dilatation but there is fluid filling of normal caliber small bowel in the mid to lower abdomen with fluid levels compatible with ileus/enteritis There is no small bowel obstruction or inflammatory change. An appendix is not seen in this patient. There is fluid in the colon as well, but again no wall thickening. There are uncomplicated sigmoid diverticula. Vascular/Lymphatic: Aortic atherosclerosis. No enlarged abdominal or pelvic lymph nodes. Reproductive: The uterus is intact. There are small calcified uterine fibroids. No adnexal  mass is seen. Other: There is no free air, free hemorrhage, free fluid or incarcerated hernia. There are small inguinal fat hernias. Musculoskeletal: There is lumbar facet hypertrophy greatest at L4-5 where there is grade 1 degenerative anterolisthesis. Osteopenia. No acute or other significant osseous findings. Asymmetrically advanced left hip DJD. IMPRESSION: 1. Fluid in the small bowel and colon consistent with ileus/enteritis. No bowel obstruction or inflammatory change. 2. Diverticulosis without evidence of diverticulitis. 3. Hepatic cirrhosis with mild steatosis. No ascites, portal vein dilatation or splenomegaly. 4. Aortic atherosclerosis. 5. Stable 1 cm right lower lobe ground-glass nodule. 6. Small hiatal hernia.  Small inguinal fat hernias. 7. Osteopenia and degenerative change. Aortic Atherosclerosis (ICD10-I70.0). Electronically Signed   By: Telford Nab M.D.   On: 11/01/2022 05:52    Orson Eva, DO  [facility]  If 7PM-7AM, please contact night-coverage [email or web address] Password [email or web address] 11/13/2022, 3:27 PM   LOS: 12 days

## 2022-11-14 ENCOUNTER — Ambulatory Visit: Payer: Medicare HMO

## 2022-11-14 DIAGNOSIS — K529 Noninfective gastroenteritis and colitis, unspecified: Secondary | ICD-10-CM | POA: Diagnosis not present

## 2022-11-14 DIAGNOSIS — E876 Hypokalemia: Secondary | ICD-10-CM | POA: Diagnosis not present

## 2022-11-14 DIAGNOSIS — N179 Acute kidney failure, unspecified: Secondary | ICD-10-CM | POA: Diagnosis not present

## 2022-11-14 DIAGNOSIS — K602 Anal fissure, unspecified: Secondary | ICD-10-CM | POA: Diagnosis not present

## 2022-11-14 LAB — BASIC METABOLIC PANEL
Anion gap: 8 (ref 5–15)
BUN: 22 mg/dL (ref 8–23)
CO2: 27 mmol/L (ref 22–32)
Calcium: 7.6 mg/dL — ABNORMAL LOW (ref 8.9–10.3)
Chloride: 103 mmol/L (ref 98–111)
Creatinine, Ser: 2.65 mg/dL — ABNORMAL HIGH (ref 0.44–1.00)
GFR, Estimated: 18 mL/min — ABNORMAL LOW (ref >60)
Glucose, Bld: 108 mg/dL — ABNORMAL HIGH (ref 70–99)
Potassium: 4 mmol/L (ref 3.5–5.1)
Sodium: 138 mmol/L (ref 135–145)

## 2022-11-14 LAB — MAGNESIUM: Magnesium: 1.6 mg/dL — ABNORMAL LOW (ref 1.7–2.4)

## 2022-11-14 MED ORDER — CHOLESTYRAMINE 4 G PO PACK
4.0000 g | PACK | Freq: Every day | ORAL | Status: DC
  Administered 2022-11-15: 4 g via ORAL
  Filled 2022-11-14 (×3): qty 1

## 2022-11-14 MED ORDER — MAGNESIUM OXIDE -MG SUPPLEMENT 400 (240 MG) MG PO TABS
400.0000 mg | ORAL_TABLET | Freq: Every day | ORAL | Status: DC
  Administered 2022-11-14 – 2022-11-15 (×2): 400 mg via ORAL
  Filled 2022-11-14 (×2): qty 1

## 2022-11-14 NOTE — Care Management Important Message (Signed)
Important Message  Patient Details  Name: Renee Maldonado MRN: [id number] Date of Birth: 1946-10-29   Medicare Important Message Given:  Yes (spoke with patient by phone at [phone], copy will be provided)     Tommy Medal 11/14/2022, 1:51 PM

## 2022-11-14 NOTE — Progress Notes (Addendum)
[facility] Progress Note     Subjective: Patient seen and examined.  She is resting comfortably in the chair.  She has no significant complaints at this time.  She is just anxious about going home soon and is worried about caring for herself and for her husband.  She is tolerating a diet without nausea and vomiting.  She denies any abdominal pain.  She had 2 bowel movements yesterday, which were more formed.  She denies any anal pain at this time.  Objective: Vital signs in last 24 hours: Temp:  [98.2 F (36.8 C)-98.7 F (37.1 C)] 98.7 F (37.1 C) (12/29 0421) Pulse Rate:  [86-87] 86 (12/29 0421) Resp:  [16-18] 16 (12/29 0421) BP: (136-146)/(66-75) 146/75 (12/29 0421) SpO2:  [95 %-96 %] 95 % (12/29 0421) Last BM Date : 11/13/22  Intake/Output from previous day: 12/28 0701 - 12/29 0700 In: 578.6 [I.V.:578.6] Out: 1475 [Urine:1475] Intake/Output this shift: Total I/O In: 480 [P.O.:480] Out: 400 [Urine:400]  General appearance: alert, cooperative, and no distress GI: Abdomen soft, nondistended, no percussion tenderness, nontender to palpation Genitourinary: Anus with small ulceration at the 6 o'clock position with overlying fibrinous exudate, nontender, no induration or erythema  Lab Results:  Recent Labs    11/12/22 0356 11/13/22 0340  WBC 5.2 4.6  HGB 9.9* 9.4*  HCT 28.7* 28.1*  PLT 152 144*   BMET Recent Labs    11/13/22 0340 11/14/22 0332  NA 136 138  K 4.0 4.0  CL 102 103  CO2 28 27  GLUCOSE 111* 108*  BUN 23 22  CREATININE 2.62* 2.65*  CALCIUM 7.5* 7.6*   PT/INR No results for input(s): "LABPROT", "INR" in the last 72 hours.  Studies/Results: No results found.  Anti-infectives: Anti-infectives (From admission, onward)    None       Assessment/Plan:  Patient is a 76 year old female who was admitted with hypokalemia secondary to adenovirus enteritis.  General surgery consulted for evaluation of anal fissure/ulceration.  -I  explained to the patient that we will attempt conservative measures for this area and see how she is doing in about a month -While inpatient, we will use Anusol suppositories -I have sent a prescription to [facility] for hemorrhoid cream compounded with nitroglycerin which will be applied to her anus 4 times a day. -She will also need prescription for sitz bath's to obtain container that she can use.  I want her to perform these 3-4 times daily and after bowel movements -She needs to increase fiber and water intake to try and keep herself regular -While outpatient, I recommend she take stool softeners twice daily.  She should also take a laxative as needed to try and have daily bowel movements -Use wet wipes after having a bowel movement to clean herself -No acute surgical intervention at this time -She will follow-up with me in a month to check this area -All information included in her discharge paperwork -Recommend social worker meet with patient, to see if she needs any home health care to assist -Care per hospitalist -Please call with any questions or concerns   LOS: 13 days    Cledith Kamiya A Kamora Vossler 11/14/2022

## 2022-11-14 NOTE — TOC Progression Note (Signed)
Transition of Care [facility]) - Progression Note    Patient Details  Name: Renee Maldonado MRN: [id number] Date of Birth: [birth year]  [facility] CM/SW Contact  Boneta Lucks, RN Phone Number: 11/14/2022, 1:35 PM  Clinical Narrative:   Patient and family requesting TOC set up home health. She has [employer] and another policy they think will cover home health.  Georgina Snell with [facility] accepted the referral. TOC to follow for discharge plan and orders.    Expected Discharge Plan: [place] Barriers to Discharge: Continued Medical Work up  Expected Discharge Plan and Services      Living arrangements for the past 2 months: [place]: [place] Date [place]: 11/14/22 Time [place]: [id number] Representative spoke with at [place]: [place] Determinants of Health ([place]) Interventions SDOH Screenings   Food Insecurity: No Food Insecurity (11/01/2022)  Housing: Low Risk  (11/01/2022)  Transportation Needs: No Transportation Needs (11/01/2022)  Utilities: Not At Risk (11/01/2022)  Alcohol Screen: Low Risk  (08/02/2021)  Depression (PHQ2-9): Low Risk  (07/14/2022)  Financial Resource Strain: Low Risk  (08/02/2021)  Physical Activity: Insufficiently Active (08/02/2021)  Social Connections: Socially Integrated (08/02/2021)  Stress: No Stress Concern Present (08/02/2021)  Tobacco Use: Medium Risk (11/01/2022)    Readmission Risk Interventions    11/14/2022    1:35 PM  Readmission Risk Prevention Plan  Transportation Screening Complete  PCP or Specialist Appt within 5-7 Days Not Complete  Home Care Screening Complete  Medication Review (RN CM) Complete

## 2022-11-14 NOTE — Progress Notes (Signed)
Patient is resting in her bed at this time. One prn medication given during this shift. See MAR. Patient assisted to the bedside commode x2. Patient tolerated well.

## 2022-11-14 NOTE — Discharge Instructions (Addendum)
-  You will need to apply the compounded hemorrhoid cream 4 times a day to your anus and anal ulceration.  I recommend applying it after bowel movements -This prescription has been sent to [facility].  The address is [address] [place] [address]  -The first time you use this cream, make sure that you have someone with you, as one of the medications in the cream can cause headaches. -Call Dr. Stephania Fragmin office at [phone] if you need a refill of this cream  -Take Sitz baths (shallow warm water baths) 3-4 times a day and after bowel movements.  Sit in the water until it is cold -Use wet wipes after bowel movements -Please keep your stools soft and take fiber daily (metamucil) and colace (over the counter) to help prevent constipation.   -Make sure to drink lots of water -Try to have daily bowel movements to decrease risk of constipation.  Take over-the-counter MiraLAX or milk of magnesia as needed to have very bowel

## 2022-11-14 NOTE — Progress Notes (Signed)
PROGRESS NOTE  Renee Maldonado QIH:[id number] DOB: [birth year] DOA: 11/01/2022 PCP: Claretta Fraise, MD  Brief History:  Renee Maldonado is a 76 y.o. female with medical history significant for hypertension, dyslipidemia, anxiety disorder, COPD, GERD, and prior thyroid cancer with resection who presented to the ED with ongoing profuse, watery diarrhea that has been ongoing for the last 1 week.  She was admitted for evaluation of diarrhea with associated severe hypokalemia as well as some AKI related to this.  She is noted to have adenovirus gastroenteritis.  She continues to have ongoing significant diarrhea with nausea and vomiting that requires aggressive IV fluid repletion.  Zofran and Lomotil scheduled.  Her hospitalization has been prolonged due to persistent diarrhea for which GI was consulted.  She also developed acute on chronic renal failure for which renal was consulted.  This was felt to be due to ATN/hemodynamic changes due to the patient's diarrhea.  Questran was added and Lomotil was discontinued. The patient's diarrhea ultimately slowed down and improved, and she began having formed stool.     Assessment/Plan:  Severe hypokalemia secondary to adenovirus Enteritis -hypokalemia improving with improving diarrhea -Continue IV fluid repletion -C. difficile negative -advanced to soft foods diet which pt is tolerating -Imodium as needed -Scheduled Lomotil and Zofran 12/19, discussed case with GI with recommendations to continue the same and add probiotics -continue aggressive supportive measures -requested GI consult and appreciate assistance -Pt started on cholestyramine 4 gm BID by GI service  -diarrhea seems to be resolved now;  -she is having formed stool -discontinued scheduled lomotil; DC imodium;  -potassium has been repleted;     Severe hypophosphatemia -kphos replacement ordered -recheck phos--2.6   AKI on CKD stage IIIa -due to volume depletion/ATN in the setting  of ongoing diarrhea -Aggressive IV fluid ongoing, increase fluid rate to 150 cc/h initially -now IVF at 50cc/hr -Avoid nephrotoxic agents -consulted to nephrology on 12/21  -increased hydration and suspect ATN  -slowly improving with treatments   Mild hyponatremia-resolved -Likely related to dehydration, monitor on normal saline infusion -Also uses HCTZ which has been held at this time   Hypomagnesemia -IV Mg ordered and repleted    Anal Fissure/ulcer -sitz bath daily x 20 mins -appreciate Dr. Okey Dupre input>>anusol for now -good hygiene   Dyslipidemia -Continue home medications   Hypertension -Hold home HCTZ    GERD -Continue on PPI   History of COPD with prior tobacco abuse -Continue Breo -Rescue inhalers as needed   Obesity -BMI 87.56   Metabolic acidosis - nephrology started on sodium bicarb infusion starting 12/24 with improvement in CO2 from 10 --> 14 -->23 -d/c bicarb drip         Family Communication: no  Family at bedside   Consultants:  GI, renal, general surgery   Code Status:  FULL    DVT Prophylaxis:  [place] Heparin    Procedures: As Listed in Progress Note Above   Antibiotics: None      Subjective: Patient denies fevers, chills, headache, chest pain, dyspnea, nausea, vomiting, diarrhea, abdominal pain, dysuria, hematuria, hematochezia, and melena. She had 2 formed stools today  Objective: Vitals:   11/13/22 0755 11/13/22 0901 11/13/22 1429 11/14/22 0421  BP:  (!) 137/58 136/66 (!) 146/75  Pulse:  86 87 86  Resp:  18 18 16   Temp:  99 F (37.2 C) 98.2 F (36.8 C) 98.7 F (37.1 C)  TempSrc:  Oral Oral   SpO2: 95% 96%  96% 95%  Weight:      Height:        Intake/Output Summary (Last 24 hours) at 11/14/2022 1711 Last data filed at 11/14/2022 1510 Gross per 24 hour  Intake 2229.92 ml  Output 1500 ml  Net 729.92 ml   Weight change:  Exam:  General:  Pt is alert, follows commands appropriately, not in acute distress HEENT:  No icterus, No thrush, No neck mass, [place]/AT Cardiovascular: RRR, S1/S2, no rubs, no gallops Respiratory: bibasilar rales. No wheeze Abdomen: Soft/+BS, non tender, non distended, no guarding Extremities: 1 + LE edema, No lymphangitis, No petechiae, No rashes, no synovitis   Data Reviewed: I have personally reviewed following labs and imaging studies Basic Metabolic Panel: Recent Labs  Lab 11/09/22 0414 11/10/22 0503 11/11/22 0401 11/12/22 0356 11/13/22 0340 11/14/22 0332  NA 134* 134* 134* 136 136 138  K 3.7 3.9 3.7 4.1 4.0 4.0  CL 118* 115* 106 105 102 103  CO2 10* 14* 23 24 28 27   GLUCOSE 127* 123* 150* 108* 111* 108*  BUN 23 25* 26* 22 23 22   CREATININE 2.89* 2.75* 2.83* 2.67* 2.62* 2.65*  CALCIUM 7.2* 7.3* 7.2* 7.3* 7.5* 7.6*  MG 1.3* 2.0 1.7  --  1.7 1.6*  PHOS  --   --  <1.0* 2.6 3.2  --    Liver Function Tests: No results for input(s): "AST", "ALT", "ALKPHOS", "BILITOT", "PROT", "ALBUMIN" in the last 168 hours. No results for input(s): "LIPASE", "AMYLASE" in the last 168 hours. No results for input(s): "AMMONIA" in the last 168 hours. Coagulation Profile: No results for input(s): "INR", "PROTIME" in the last 168 hours. CBC: Recent Labs  Lab 11/08/22 0622 11/12/22 0356 11/13/22 0340  WBC 10.4 5.2 4.6  NEUTROABS 8.8*  --   --   HGB 11.3* 9.9* 9.4*  HCT 33.0* 28.7* 28.1*  MCV 79.9* 77.4* 80.1  PLT 170 152 144*   Cardiac Enzymes: No results for input(s): "CKTOTAL", "CKMB", "CKMBINDEX", "TROPONINI" in the last 168 hours. BNP: Invalid input(s): "POCBNP" CBG: No results for input(s): "GLUCAP" in the last 168 hours. HbA1C: No results for input(s): "HGBA1C" in the last 72 hours. Urine analysis:    Component Value Date/Time   COLORURINE STRAW (A) 11/11/2022 1330   APPEARANCEUR CLEAR 11/11/2022 1330   APPEARANCEUR Clear 02/08/2018 1042   LABSPEC 1.005 11/11/2022 1330   PHURINE 5.0 11/11/2022 1330   GLUCOSEU 50 (A) 11/11/2022 1330   HGBUR MODERATE (A)  11/11/2022 1330   BILIRUBINUR NEGATIVE 11/11/2022 1330   BILIRUBINUR Negative 02/08/2018 1042   KETONESUR NEGATIVE 11/11/2022 1330   PROTEINUR NEGATIVE 11/11/2022 1330   UROBILINOGEN negative 04/03/2015 1039   NITRITE NEGATIVE 11/11/2022 1330   LEUKOCYTESUR NEGATIVE 11/11/2022 1330   Sepsis Labs: @LABRCNTIP (procalcitonin:4,lacticidven:4) )No results found for this or any previous visit (from the past 240 hour(s)).   Scheduled Meds:  anastrozole  1 mg Oral q AM   cholestyramine  4 g Oral BID   famotidine  10 mg Oral Daily   fluticasone furoate-vilanterol  1 puff Inhalation Once per day on Sun Tue Thu Sat   heparin  5,000 Units Subcutaneous Q8H   hydrocortisone  25 mg Rectal BID   melatonin  9 mg Oral QHS   ondansetron (ZOFRAN) IV  4 mg Intravenous Q6H   oxyCODONE  2.5 mg Oral Once   potassium chloride  40 mEq Oral Daily   raloxifene  60 mg Oral Daily   rosuvastatin  20 mg Oral Daily   saccharomyces  boulardii  250 mg Oral BID   traZODone  100 mg Oral QHS   Continuous Infusions:  lactated ringers 50 mL/hr at 11/14/22 1510    Procedures/Studies: DG Abd 1 View  Result Date: 11/11/2022 CLINICAL DATA:  Abdominal pain. EXAM: ABDOMEN - 1 VIEW COMPARISON:  CT abdomen and pelvis 11/01/2022 FINDINGS: The upper abdomen was incompletely imaged. Gas is present in loops of small bowel throughout the abdomen without frank bowel dilatation to indicate mechanical obstruction. No abnormal abdominal or pelvic calcification is identified. There is asymmetrically severe left hip osteoarthrosis. IMPRESSION: No evidence of bowel obstruction. Electronically Signed   By: Logan Bores M.D.   On: 11/11/2022 08:38   CT ABDOMEN PELVIS WO CONTRAST  Result Date: 11/01/2022 CLINICAL DATA:  Patient with a recently diagnosed non-small cell lung cancer right upper lobe, presents to the ED with nonlocalized acute abdomen pain and diarrhea since 10/25/2022. EXAM: CT ABDOMEN AND PELVIS WITHOUT CONTRAST TECHNIQUE:  Multidetector CT imaging of the abdomen and pelvis was performed following the standard protocol without IV contrast. RADIATION DOSE REDUCTION: This exam was performed according to the departmental dose-optimization program which includes automated exposure control, adjustment of the mA and/or kV according to patient size and/or use of iterative reconstruction technique. COMPARISON:  Chest CT without contrast 09/09/2022, and previous chest CTs dating back to 03/03/2022, as well as PET-CT 04/30/2022. FINDINGS: Lower chest: Stable 1 cm right lower lobe ground-glass nodule on 4:21. Scattered linear scarring or atelectasis both lower lobes. No acute process. The cardiac size is normal. Small stable pericardial effusion again noted and small hiatal hernia. Hepatobiliary: Capsular nodularity along the left lobe is noted consistent with cirrhosis. There is no dilatation of the hepatic portal main vein. The gallbladder is mildly distended without calcified stones, wall thickening or biliary dilatation. The unenhanced liver disease not show evidence of a mass. There is mild steatosis with hepatic length measurement 17 cm. Pancreas: There is moderate partial pancreatic atrophy. The unenhanced pancreas otherwise unremarkable without contrast. Spleen: Unremarkable without contrast.  No splenomegaly. Adrenals/Urinary Tract: There is no adrenal mass. Right kidney demonstrates a homogeneous thin walled 2 cm cyst in the outer mid to lower pole cortex, Hounsfield density of 12.4. There is an 8 mm moderately dense exophytic lesion in the superior pole of this kidney which is too small to characterize. Both unenhanced kidneys are otherwise unremarkable. No follow-up imaging is recommended. For reference see JACR 2018 Feb; 264-273, Management of the Incidental Renal Mass on CT, [employer] 2021; 814-848, Bosniak Classification of Cystic Renal Masses, Version 2019. There is no urinary stone or obstruction. The bladder is unremarkable  allowing for the degree of distention. Stomach/Bowel: Small hiatal hernia. The gastric wall is contracted. There is no small bowel dilatation but there is fluid filling of normal caliber small bowel in the mid to lower abdomen with fluid levels compatible with ileus/enteritis There is no small bowel obstruction or inflammatory change. An appendix is not seen in this patient. There is fluid in the colon as well, but again no wall thickening. There are uncomplicated sigmoid diverticula. Vascular/Lymphatic: Aortic atherosclerosis. No enlarged abdominal or pelvic lymph nodes. Reproductive: The uterus is intact. There are small calcified uterine fibroids. No adnexal mass is seen. Other: There is no free air, free hemorrhage, free fluid or incarcerated hernia. There are small inguinal fat hernias. Musculoskeletal: There is lumbar facet hypertrophy greatest at L4-5 where there is grade 1 degenerative anterolisthesis. Osteopenia. No acute or other significant osseous findings. Asymmetrically advanced left  hip DJD. IMPRESSION: 1. Fluid in the small bowel and colon consistent with ileus/enteritis. No bowel obstruction or inflammatory change. 2. Diverticulosis without evidence of diverticulitis. 3. Hepatic cirrhosis with mild steatosis. No ascites, portal vein dilatation or splenomegaly. 4. Aortic atherosclerosis. 5. Stable 1 cm right lower lobe ground-glass nodule. 6. Small hiatal hernia.  Small inguinal fat hernias. 7. Osteopenia and degenerative change. Aortic Atherosclerosis (ICD10-I70.0). Electronically Signed   By: Telford Nab M.D.   On: 11/01/2022 05:52    Orson Eva, DO  [facility]  If 7PM-7AM, please contact night-coverage [email or web address] Password [email or web address] 11/14/2022, 5:11 PM   LOS: 13 days

## 2022-11-15 DIAGNOSIS — N179 Acute kidney failure, unspecified: Secondary | ICD-10-CM | POA: Diagnosis not present

## 2022-11-15 DIAGNOSIS — E876 Hypokalemia: Secondary | ICD-10-CM | POA: Diagnosis not present

## 2022-11-15 DIAGNOSIS — K602 Anal fissure, unspecified: Secondary | ICD-10-CM | POA: Diagnosis not present

## 2022-11-15 LAB — BASIC METABOLIC PANEL
Anion gap: 6 (ref 5–15)
BUN: 23 mg/dL (ref 8–23)
CO2: 26 mmol/L (ref 22–32)
Calcium: 7.4 mg/dL — ABNORMAL LOW (ref 8.9–10.3)
Chloride: 104 mmol/L (ref 98–111)
Creatinine, Ser: 2.6 mg/dL — ABNORMAL HIGH (ref 0.44–1.00)
GFR, Estimated: 18 mL/min — ABNORMAL LOW (ref >60)
Glucose, Bld: 109 mg/dL — ABNORMAL HIGH (ref 70–99)
Potassium: 4 mmol/L (ref 3.5–5.1)
Sodium: 136 mmol/L (ref 135–145)

## 2022-11-15 LAB — PHOSPHORUS: Phosphorus: 4.1 mg/dL (ref 2.5–4.6)

## 2022-11-15 LAB — MAGNESIUM: Magnesium: 1.7 mg/dL (ref 1.7–2.4)

## 2022-11-15 MED ORDER — POTASSIUM CHLORIDE 20 MEQ PO PACK: 20.0000 meq | PACK | Freq: Every day | ORAL | 0 refills | Status: DC

## 2022-11-15 MED ORDER — CHOLESTYRAMINE 4 G PO PACK: 4.0000 g | PACK | Freq: Every day | ORAL | 0 refills | Status: DC

## 2022-11-15 MED ORDER — MAGNESIUM OXIDE -MG SUPPLEMENT 400 (240 MG) MG PO TABS: 400.0000 mg | ORAL_TABLET | Freq: Every day | ORAL | 0 refills | Status: DC

## 2022-11-15 NOTE — TOC Transition Note (Signed)
Transition of Care [facility]) - CM/SW Discharge Note   Patient Details  Name: Renee Maldonado MRN: [id number] Date of Birth: 08-11-1946  [facility] of [facility]) CM/SW Contact:  Boneta Lucks, RN Phone Number: 11/15/2022, 11:31 AM   Clinical Narrative:   CM spoke with daughter, Renee Maldonado. MD ordered RN/PT/SW. Georgina Snell with [facility] updated with Discharge. MD also order Sitz bath, CM explained to daughter, family will need to pick up at [place], [place], [employer].    Renee Maldonado has many concerns,ask CM to document the reason they need SW in the home. Since she has been assisting with her parents, She has noticed the decline. Her father has short term memory issues, They had a appointment for a cognitive test, that morning they could not get him out of bed. Also he has been having BM accidents. Per Renee Maldonado her parents have been giving her brother, Jerrye Beavers a lot of money, driving his children to and from school, Per Renee Maldonado, her father should not be driving. The mother realized this needs to stop and agreed with having a 50B taken out on Marty(brother) after he came in the home fusing with them over money and mother refusing to continue to provide transportation for children.  Now that patient has been in the hospital, the Mother is turning on Renee Maldonado and saying she is afraid of her. Will not allow Renee Maldonado to pick her up or come back to the house.   CM updated Renee Maldonado with home health orders and discharge plan for today.      Final next level of care: Home w Home Health Services Barriers to Discharge: Barriers Resolved   Patient Goals and CMS Choice CMS Medicare.gov Compare Post Acute Care list provided to:: Patient Choice offered to / list presented to : Patient  Discharge Placement    Name of family member notified: Renee Maldonado Patient and family notified of of transfer: 11/15/22  Discharge Plan and Services Additional resources added to the After Visit Summary for        [facility] Arranged: RN, PT, Social  Work [facility] Agency: [place] Date [facility] Agency Contacted: 11/14/22 Time [place]: [id number] Representative spoke with at [place]: [place] Determinants of Health ([place]) Interventions SDOH Screenings   Food Insecurity: No Food Insecurity (11/01/2022)  Housing: Low Risk  (11/01/2022)  Transportation Needs: No Transportation Needs (11/01/2022)  Utilities: Not At Risk (11/01/2022)  Alcohol Screen: Low Risk  (08/02/2021)  Depression (PHQ2-9): Low Risk  (07/14/2022)  Financial Resource Strain: Low Risk  (08/02/2021)  Physical Activity: Insufficiently Active (08/02/2021)  Social Connections: Socially Integrated (08/02/2021)  Stress: No Stress Concern Present (08/02/2021)  Tobacco Use: Medium Risk (11/01/2022)    Readmission Risk Interventions    11/14/2022    1:35 PM  Readmission Risk Prevention Plan  Transportation Screening Complete  PCP or Specialist Appt within 5-7 Days Not Complete  Home Care Screening Complete  Medication Review (RN CM) Complete

## 2022-11-15 NOTE — Discharge Summary (Signed)
Physician Discharge Summary   [name] MRN: [id number] DOB: Apr 16, 1946  Admit date:     11/01/2022  Discharge date: 11/15/22  Discharge Physician: Shanon Brow Vuk Skillern   PCP: Claretta Fraise, MD   Recommendations at discharge:   Please follow up with primary care provider within 1-2 weeks  Please repeat BMP and CBC in one week      Hospital Course: 76 y.o. female with medical history significant for hypertension, dyslipidemia, anxiety disorder, COPD, GERD, and prior thyroid cancer with resection who presented to the ED with ongoing profuse, watery diarrhea that has been ongoing for the last 1 week.  She was admitted for evaluation of diarrhea with associated severe hypokalemia as well as some AKI related to this.  She is noted to have adenovirus gastroenteritis.  She continues to have ongoing significant diarrhea with nausea and vomiting that requires aggressive IV fluid repletion.  Zofran and Lomotil scheduled.  Her hospitalization has been prolonged due to persistent diarrhea for which GI was consulted.  She also developed acute on chronic renal failure for which renal was consulted.  This was felt to be due to ATN/hemodynamic changes due to the patient's diarrhea.  Questran was added and Lomotil was discontinued. The patient's diarrhea ultimately slowed down and improved, and she began having formed stool.  Her diet was advanced which she tolerated.  General surgery saw patient regarding a rectal fissure/ulcer>>Rx compound cream with NTG and hydrocortisone was sent to [facility]  She was instructed to perform Sitz Baths after each BM at home and avoid constipation.  Assessment and Plan: Severe hypokalemia secondary to adenovirus Enteritis -hypokalemia improving with improving diarrhea -Continue IV fluid repletion -C. difficile negative -advanced to soft foods diet which pt is tolerating -Imodium as needed>>stopped -Scheduled Lomotil and Zofran 12/19, discussed case with GI  with recommendations to continue the same and add probiotics -continue aggressive supportive measures -requested GI consult and appreciate assistance -Pt started on cholestyramine 4 gm BID by GI service>>decreased to once daily -diarrhea seems to be resolved now;  -she is having formed stool -discontinued scheduled lomotil; DC imodium;  -potassium has been repleted>>d/c home with KCl 20 mEq daily  -repeat BMP in one week after d/c   Severe hypophosphatemia -kphos replacement ordered   AKI on CKD stage IIIa -due to volume depletion/ATN in the setting of ongoing diarrhea -Aggressive IV fluid ongoing, increase fluid rate to 150 cc/h initially -now IVF at 50cc/hr -baseline creatinine 1.2-1.4 -consulted to nephrology on 12/21  -increased hydration and suspect ATN  -slowly improving with treatments -overall serum creatinine did not return to baseline, but remained stable between 2.60-2.65 for 96 hours prior to d/c -she will need outpt nephrology followup   Mild hyponatremia-resolved -Likely related to dehydration, monitor on normal saline infusion -Also uses HCTZ which has been held at this time>>will not restart   Hypomagnesemia -IV Mg ordered and repleted  -d/c home with mag ox 400 mg daily   Anal Fissure/ulcer -sitz bath daily  -appreciate Dr. Okey Dupre input>>anusol for now -good hygiene -d/c home with hydrocortisone+nitroglycerin cream four times daily   Dyslipidemia -Continue home medications   Hypertension -Hold home HCTZ    GERD -Continue on PPI   History of COPD with prior tobacco abuse -Continue Breo -Rescue inhalers as needed   Obesity -BMI 82.95   Metabolic acidosis - nephrology started on sodium bicarb infusion starting 12/24 with improvement in CO2 from 10 --> 14 -->23 -d/c bicarb drip       Consultants:  GI, general surgery, nephrology Procedures performed: none  Disposition: Home health Diet recommendation:  Cardiac diet DISCHARGE  MEDICATION: Allergies as of 11/15/2022       Reactions   Benadryl [diphenhydramine Hcl] Nausea Only   Dizziness   Penicillins    Had a rash around 20 years ago but unsure if it come from medicine.  Tolerated Cephalosporin Date: 10/09/20.        Medication List     STOP taking these medications    aspirin 500 MG tablet   hydrochlorothiazide 25 MG tablet Commonly known as: HYDRODIURIL   hydrocortisone cream 1 % Replaced by: hydrocortisone 2.5 % cream   ondansetron 4 MG tablet Commonly known as: Zofran       TAKE these medications    albuterol 108 (90 Base) MCG/ACT inhaler Commonly known as: VENTOLIN HFA 2 puffs in lungs every 6 hours as needed for wheezing/short of breath.   amLODipine 5 MG tablet Commonly known as: NORVASC Take 1 tablet (5 mg total) by mouth daily.   anastrozole 1 MG tablet Commonly known as: ARIMIDEX Take 1 mg by mouth in the morning.   Calcium Carb-Cholecalciferol 600-20 MG-MCG Tabs Take 1 tablet by mouth in the morning.   Chlor-Trimeton 4 MG tablet Generic drug: chlorpheniramine Take 4 mg by mouth See admin instructions. Take 1 tablet (4 mg) by mouth scheduled every night & may take an additional dose in the morning if needed for allergies.   cholecalciferol 25 MCG (1000 UNIT) tablet Commonly known as: VITAMIN D3 Take 1,000 Units by mouth in the morning.   cholestyramine 4 g packet Commonly known as: QUESTRAN Take 1 packet (4 g total) by mouth daily. Start taking on: November 16, 2022   cyanocobalamin 1000 MCG/ML injection Commonly known as: VITAMIN B12 Inject 1 mL (1,000 mcg total) into the skin every 30 (thirty) days.   famotidine 40 MG tablet Commonly known as: PEPCID Take 1 tablet (40 mg total) by mouth daily.   fluticasone furoate-vilanterol 200-25 MCG/ACT Aepb Commonly known as: Breo Ellipta Inhale 1 puff into the lungs daily. What changed: when to take this   hydrocortisone 2.5 % cream Apply topically in the  morning, at noon, in the evening, and at bedtime. Apply to your anus 4 times a day.  Apothecary hemorrhoid cream with nitroglycerin added Replaces: hydrocortisone cream 1 %   hydrOXYzine 25 MG tablet Commonly known as: ATARAX Take 25 mg by mouth every 4 (four) hours as needed for itching.   L-Lysine 500 MG Tabs Take 500 mg by mouth every evening.   Lubricant Eye Drops 0.4-0.3 % Soln Generic drug: Polyethyl Glycol-Propyl Glycol Place 1-2 drops into both eyes 3 (three) times daily as needed (dry/irritated eyes.).   magnesium oxide 400 (240 Mg) MG tablet Commonly known as: MAG-OX Take 1 tablet (400 mg total) by mouth daily. Start taking on: November 16, 2022   Melatonin 10 MG Caps Take 10 mg by mouth at bedtime.   potassium chloride 20 MEQ packet Commonly known as: KLOR-CON Take 20 mEq by mouth daily. Start taking on: November 16, 2022   raloxifene 60 MG tablet Commonly known as: EVISTA Take 1 tablet (60 mg total) by mouth daily.   Restasis 0.05 % ophthalmic emulsion Generic drug: cycloSPORINE Place 1 drop into both eyes 2 (two) times daily as needed (dry eyes).   rosuvastatin 20 MG tablet Commonly known as: CRESTOR TAKE 1 TABLET DAILY   sodium chloride 0.65 % Soln nasal spray Commonly known as: OCEAN  Place 1 spray into both nostrils as needed for congestion.   traZODone 100 MG tablet Commonly known as: DESYREL TAKE 1 TABLET AT BEDTIME   valACYclovir 500 MG tablet Commonly known as: VALTREX TAKE (1) TABLET DAILY AS NEEDED. What changed:  how much to take how to take this when to take this reasons to take this additional instructions               Durable Medical Equipment  (From admission, onward)           Start     Ordered   11/15/22 1041  For home use only DME Other see comment  Once       Comments: Patient needs Sitz Bath Tub  Question:  Length of Need  Answer:  6 Months   11/15/22 1040            Follow-up Information      Pappayliou, Barnetta Chapel A, DO. Call.   Specialty: General Surgery Why: Call to schedule an appointment in 4 weeks Contact information: [address] Linna Hoff [facility] [address] [phone]         Care, [facility] Follow up.   Specialty: Home Health Services Why: RN/PT will call to schedule your first home visit. Contact information: [place] [address] [phone]                Discharge Exam: Danley Danker Weights   11/01/22 0215 11/02/22 0534  Weight: 85.1 kg 74.7 kg   HEENT:  [place]/AT, No thrush, no icterus CV:  RRR, no rub, no S3, no S4 Lung:  CTA, no wheeze, no rhonchi Abd:  soft/+BS, NT Ext:  No edema, no lymphangitis, no synovitis, no rash   Condition at discharge: stable  The results of significant diagnostics from this hospitalization (including imaging, microbiology, ancillary and laboratory) are listed below for reference.   Imaging Studies: DG Abd 1 View  Result Date: 11/11/2022 CLINICAL DATA:  Abdominal pain. EXAM: ABDOMEN - 1 VIEW COMPARISON:  CT abdomen and pelvis 11/01/2022 FINDINGS: The upper abdomen was incompletely imaged. Gas is present in loops of small bowel throughout the abdomen without frank bowel dilatation to indicate mechanical obstruction. No abnormal abdominal or pelvic calcification is identified. There is asymmetrically severe left hip osteoarthrosis. IMPRESSION: No evidence of bowel obstruction. Electronically Signed   By: Logan Bores M.D.   On: 11/11/2022 08:38   CT ABDOMEN PELVIS WO CONTRAST  Result Date: 11/01/2022 CLINICAL DATA:  Patient with a recently diagnosed non-small cell lung cancer right upper lobe, presents to the ED with nonlocalized acute abdomen pain and diarrhea since 10/25/2022. EXAM: CT ABDOMEN AND PELVIS WITHOUT CONTRAST TECHNIQUE: Multidetector CT imaging of the abdomen and pelvis was performed following the standard protocol without IV contrast. RADIATION DOSE REDUCTION: This exam  was performed according to the departmental dose-optimization program which includes automated exposure control, adjustment of the mA and/or kV according to patient size and/or use of iterative reconstruction technique. COMPARISON:  Chest CT without contrast 09/09/2022, and previous chest CTs dating back to 03/03/2022, as well as PET-CT 04/30/2022. FINDINGS: Lower chest: Stable 1 cm right lower lobe ground-glass nodule on 4:21. Scattered linear scarring or atelectasis both lower lobes. No acute process. The cardiac size is normal. Small stable pericardial effusion again noted and small hiatal hernia. Hepatobiliary: Capsular nodularity along the left lobe is noted consistent with cirrhosis. There is no dilatation of the hepatic portal main vein. The gallbladder is mildly distended without  calcified stones, wall thickening or biliary dilatation. The unenhanced liver disease not show evidence of a mass. There is mild steatosis with hepatic length measurement 17 cm. Pancreas: There is moderate partial pancreatic atrophy. The unenhanced pancreas otherwise unremarkable without contrast. Spleen: Unremarkable without contrast.  No splenomegaly. Adrenals/Urinary Tract: There is no adrenal mass. Right kidney demonstrates a homogeneous thin walled 2 cm cyst in the outer mid to lower pole cortex, Hounsfield density of 12.4. There is an 8 mm moderately dense exophytic lesion in the superior pole of this kidney which is too small to characterize. Both unenhanced kidneys are otherwise unremarkable. No follow-up imaging is recommended. For reference see JACR 2018 Feb; 264-273, Management of the Incidental Renal Mass on CT, RadioGraphics 2021; 814-848, Bosniak Classification of Cystic Renal Masses, Version 2019. There is no urinary stone or obstruction. The bladder is unremarkable allowing for the degree of distention. Stomach/Bowel: Small hiatal hernia. The gastric wall is contracted. There is no small bowel dilatation but there is  fluid filling of normal caliber small bowel in the mid to lower abdomen with fluid levels compatible with ileus/enteritis There is no small bowel obstruction or inflammatory change. An appendix is not seen in this patient. There is fluid in the colon as well, but again no wall thickening. There are uncomplicated sigmoid diverticula. Vascular/Lymphatic: Aortic atherosclerosis. No enlarged abdominal or pelvic lymph nodes. Reproductive: The uterus is intact. There are small calcified uterine fibroids. No adnexal mass is seen. Other: There is no free air, free hemorrhage, free fluid or incarcerated hernia. There are small inguinal fat hernias. Musculoskeletal: There is lumbar facet hypertrophy greatest at L4-5 where there is grade 1 degenerative anterolisthesis. Osteopenia. No acute or other significant osseous findings. Asymmetrically advanced left hip DJD. IMPRESSION: 1. Fluid in the small bowel and colon consistent with ileus/enteritis. No bowel obstruction or inflammatory change. 2. Diverticulosis without evidence of diverticulitis. 3. Hepatic cirrhosis with mild steatosis. No ascites, portal vein dilatation or splenomegaly. 4. Aortic atherosclerosis. 5. Stable 1 cm right lower lobe ground-glass nodule. 6. Small hiatal hernia.  Small inguinal fat hernias. 7. Osteopenia and degenerative change. Aortic Atherosclerosis (ICD10-I70.0). Electronically Signed   By: Telford Nab M.D.   On: 11/01/2022 05:52    Microbiology: Results for orders placed or performed during the hospital encounter of 11/01/22  Resp panel by RT-PCR (RSV, Flu A&B, Covid) Anterior Nasal Swab     Status: None   Collection Time: 11/01/22  2:39 AM   Specimen: Anterior Nasal Swab  Result Value Ref Range Status   SARS Coronavirus 2 by RT PCR NEGATIVE NEGATIVE Final    Comment: (NOTE) SARS-CoV-2 target nucleic acids are NOT DETECTED.  The SARS-CoV-2 RNA is generally detectable in upper respiratory specimens during the acute phase of  infection. The lowest concentration of SARS-CoV-2 viral copies this assay can detect is 138 copies/mL. A negative result does not preclude SARS-Cov-2 infection and should not be used as the sole basis for treatment or other patient management decisions. A negative result may occur with  improper specimen collection/handling, submission of specimen other than nasopharyngeal swab, presence of viral mutation(s) within the areas targeted by this assay, and inadequate number of viral copies(<138 copies/mL). A negative result must be combined with clinical observations, patient history, and epidemiological information. The expected result is Negative.  Fact Sheet for Patients:  EntrepreneurPulse.com.au  Fact Sheet for Healthcare Providers:  IncredibleEmployment.be  This test is no t yet approved or cleared by the [place] FDA and  has  been authorized for detection and/or diagnosis of SARS-CoV-2 by FDA under an Emergency Use Authorization (EUA). This EUA will remain  in effect (meaning this test can be used) for the duration of the COVID-19 declaration under Section 564(b)(1) of the Act, 21 U.S.C.section 360bbb-3(b)(1), unless the authorization is terminated  or revoked sooner.       Influenza A by PCR NEGATIVE NEGATIVE Final   Influenza B by PCR NEGATIVE NEGATIVE Final    Comment: (NOTE) The Xpert Xpress SARS-CoV-2/FLU/RSV plus assay is intended as an aid in the diagnosis of influenza from Nasopharyngeal swab specimens and should not be used as a sole basis for treatment. Nasal washings and aspirates are unacceptable for Xpert Xpress SARS-CoV-2/FLU/RSV testing.  Fact Sheet for Patients: EntrepreneurPulse.com.au  Fact Sheet for Healthcare Providers: IncredibleEmployment.be  This test is not yet approved or cleared by the [place] FDA and has been authorized for detection and/or diagnosis of SARS-CoV-2  by FDA under an Emergency Use Authorization (EUA). This EUA will remain in effect (meaning this test can be used) for the duration of the COVID-19 declaration under Section 564(b)(1) of the Act, 21 U.S.C. section 360bbb-3(b)(1), unless the authorization is terminated or revoked.     Resp Syncytial Virus by PCR NEGATIVE NEGATIVE Final    Comment: (NOTE) Fact Sheet for Patients: EntrepreneurPulse.com.au  Fact Sheet for Healthcare Providers: IncredibleEmployment.be  This test is not yet approved or cleared by the [place] FDA and has been authorized for detection and/or diagnosis of SARS-CoV-2 by FDA under an Emergency Use Authorization (EUA). This EUA will remain in effect (meaning this test can be used) for the duration of the COVID-19 declaration under Section 564(b)(1) of the Act, 21 U.S.C. section 360bbb-3(b)(1), unless the authorization is terminated or revoked.  Performed at [facility], [address] [place], [place] [address]   Gastrointestinal Panel by PCR , Stool     Status: Abnormal   Collection Time: 11/01/22  2:49 AM   Specimen: Urine, In & Out Cath; Stool  Result Value Ref Range Status   Campylobacter species NOT DETECTED NOT DETECTED Final   Plesimonas shigelloides NOT DETECTED NOT DETECTED Final   Salmonella species NOT DETECTED NOT DETECTED Final   Yersinia enterocolitica NOT DETECTED NOT DETECTED Final   Vibrio species NOT DETECTED NOT DETECTED Final   Vibrio cholerae NOT DETECTED NOT DETECTED Final   Enteroaggregative E coli (EAEC) NOT DETECTED NOT DETECTED Final   Enteropathogenic E coli (EPEC) NOT DETECTED NOT DETECTED Final   Enterotoxigenic E coli (ETEC) NOT DETECTED NOT DETECTED Final   Shiga like toxin producing E coli (STEC) NOT DETECTED NOT DETECTED Final   Shigella/Enteroinvasive E coli (EIEC) NOT DETECTED NOT DETECTED Final   Cryptosporidium NOT DETECTED NOT DETECTED Final   Cyclospora cayetanensis NOT  DETECTED NOT DETECTED Final   Entamoeba histolytica NOT DETECTED NOT DETECTED Final   Giardia lamblia NOT DETECTED NOT DETECTED Final   Adenovirus F40/41 DETECTED (A) NOT DETECTED Final   Astrovirus NOT DETECTED NOT DETECTED Final   Norovirus GI/GII NOT DETECTED NOT DETECTED Final   Rotavirus A NOT DETECTED NOT DETECTED Final   Sapovirus (I, II, IV, and V) NOT DETECTED NOT DETECTED Final    Comment: Performed at [facility], [address] [place], [place] [address]  C Difficile Quick Screen w PCR reflex     Status: None   Collection Time: 11/01/22  2:49 AM   Specimen: Urine, In & Out Cath; Stool  Result Value Ref Range Status  C Diff antigen NEGATIVE NEGATIVE Final   C Diff toxin NEGATIVE NEGATIVE Final   C Diff interpretation No C. difficile detected.  Final    Comment: Performed at [facility], [address], [place], [place] [address]  MRSA Next Gen by PCR, Nasal     Status: None   Collection Time: 11/01/22  3:34 PM   Specimen: Nasal Mucosa; Nasal Swab  Result Value Ref Range Status   MRSA by PCR Next Gen NOT DETECTED NOT DETECTED Final    Comment: (NOTE) The GeneXpert MRSA Assay (FDA approved for NASAL specimens only), is one component of a comprehensive MRSA colonization surveillance program. It is not intended to diagnose MRSA infection nor to guide or monitor treatment for MRSA infections. Test performance is not FDA approved in patients less than 13 years old. Performed at [facility], [address] [place], [place] [address]     Labs: CBC: Recent Labs  Lab 11/12/22 0356 11/13/22 0340  WBC 5.2 4.6  HGB 9.9* 9.4*  HCT 28.7* 28.1*  MCV 77.4* 80.1  PLT 152 449*   Basic Metabolic Panel: Recent Labs  Lab 11/10/22 0503 11/11/22 0401 11/12/22 0356 11/13/22 0340 11/14/22 0332 11/15/22 0349  NA 134* 134* 136 136 138 136  K 3.9 3.7 4.1 4.0 4.0 4.0  CL 115* 106 105 102 103 104  CO2 14* 23 24 28 27 26   GLUCOSE 123* 150* 108* 111* 108* 109*  BUN  25* 26* 22 23 22 23   CREATININE 2.75* 2.83* 2.67* 2.62* 2.65* 2.60*  CALCIUM 7.3* 7.2* 7.3* 7.5* 7.6* 7.4*  MG 2.0 1.7  --  1.7 1.6* 1.7  PHOS  --  <1.0* 2.6 3.2  --  4.1   Liver Function Tests: No results for input(s): "AST", "ALT", "ALKPHOS", "BILITOT", "PROT", "ALBUMIN" in the last 168 hours. CBG: No results for input(s): "GLUCAP" in the last 168 hours.  Discharge time spent: greater than 30 minutes.  Signed: Orson Eva, MD [facility] 11/15/2022

## 2022-11-16 DIAGNOSIS — R531 Weakness: Secondary | ICD-10-CM | POA: Diagnosis not present

## 2022-11-16 DIAGNOSIS — R5381 Other malaise: Secondary | ICD-10-CM | POA: Diagnosis not present

## 2022-11-18 ENCOUNTER — Encounter: Payer: Self-pay | Admitting: *Deleted

## 2022-11-18 ENCOUNTER — Other Ambulatory Visit: Payer: Self-pay | Admitting: Family Medicine

## 2022-11-18 ENCOUNTER — Telehealth: Payer: Self-pay | Admitting: *Deleted

## 2022-11-18 ENCOUNTER — Telehealth: Payer: Self-pay | Admitting: Family Medicine

## 2022-11-18 MED ORDER — POTASSIUM CHLORIDE ER 10 MEQ PO TBCR: 10.0000 meq | EXTENDED_RELEASE_TABLET | Freq: Two times a day (BID) | ORAL | 5 refills | Status: DC

## 2022-11-18 NOTE — Telephone Encounter (Signed)
Appointment given for 11/24/22 @ 9:25am.

## 2022-11-18 NOTE — Telephone Encounter (Signed)
TC from [facility] at [facility] Potassium Chloride 20 meq packet was called in this weekend from the hospital, Dr Tat pharmacy had to order it and it has not come in yet Requesting script from PCP for Potassium 10 meq tablets 2 QD, which will be easier for pt to swallow Please advise and if appropriate send in new script

## 2022-11-18 NOTE — Patient Outreach (Signed)
  Care Coordination [facility] Note Transition Care Management Follow-up Telephone Call Date of discharge and from where: Dr Livia Snellen 11/24/22 at 9:25 How have you been since you were released from the hospital? "I'm feeling a a little bit better" Any questions or concerns? No  Items Reviewed: Did the pt receive and understand the discharge instructions provided? Yes  Medications obtained and verified? Yes  Other? No  Any new allergies since your discharge? No  Dietary orders reviewed? Yes Do you have support at home? Yes . Husband is there and sister is coming in on Thursday to help  Home Care and Equipment/Supplies: Were home health services ordered? yes If so, what is the name of the agency? [facility]  Has the agency set up a time to come to the patient's home? yes Were any new equipment or medical supplies ordered?  No What is the name of the medical supply agency?  Were you able to get the supplies/equipment? not applicable Do you have any questions related to the use of the equipment or supplies? No  Functional Questionnaire: (I = Independent and D = Dependent) ADLs: D  Bathing/Dressing- D  Meal Prep- D  Eating- I  Maintaining continence- I  Transferring/Ambulation- I  Managing Meds- I  Follow up appointments reviewed:  PCP Hospital f/u appt confirmed? Yes  Scheduled to see Dr Livia Snellen 11/24/22 at [facility] f/u appt confirmed?  Not indicated   Are transportation arrangements needed? No  If their condition worsens, is the pt aware to call PCP or go to the Emergency Dept.? Yes Was the patient provided with contact information for the PCP's office or ED? Yes Was to pt encouraged to call back with questions or concerns? Yes  SDOH assessments and interventions completed:   Yes SDOH Interventions Today    Flowsheet Row Most Recent Value  SDOH Interventions   Transportation Interventions Intervention Not Indicated  Financial Strain Interventions Intervention Not  Indicated       Care Coordination Interventions:  No Care Coordination interventions needed at this time.   Encounter Outcome:  Pt. Visit Completed    Chong Sicilian, BSN, RN-BC RN Care Coordinator [place] Direct Dial: [phone] Main #: [phone]

## 2022-11-18 NOTE — Telephone Encounter (Signed)
Please let the patient know that I sent their prescription to their pharmacy. Thanks, WS 

## 2022-11-19 DIAGNOSIS — N1831 Chronic kidney disease, stage 3a: Secondary | ICD-10-CM | POA: Diagnosis not present

## 2022-11-19 DIAGNOSIS — Z6836 Body mass index (BMI) 36.0-36.9, adult: Secondary | ICD-10-CM | POA: Diagnosis not present

## 2022-11-19 DIAGNOSIS — M1711 Unilateral primary osteoarthritis, right knee: Secondary | ICD-10-CM | POA: Diagnosis not present

## 2022-11-19 DIAGNOSIS — Z85118 Personal history of other malignant neoplasm of bronchus and lung: Secondary | ICD-10-CM | POA: Diagnosis not present

## 2022-11-19 DIAGNOSIS — N170 Acute kidney failure with tubular necrosis: Secondary | ICD-10-CM | POA: Diagnosis not present

## 2022-11-19 DIAGNOSIS — J4489 Other specified chronic obstructive pulmonary disease: Secondary | ICD-10-CM | POA: Diagnosis not present

## 2022-11-19 DIAGNOSIS — E872 Acidosis, unspecified: Secondary | ICD-10-CM | POA: Diagnosis not present

## 2022-11-19 DIAGNOSIS — A0839 Other viral enteritis: Secondary | ICD-10-CM | POA: Diagnosis not present

## 2022-11-19 DIAGNOSIS — K449 Diaphragmatic hernia without obstruction or gangrene: Secondary | ICD-10-CM | POA: Diagnosis not present

## 2022-11-19 DIAGNOSIS — K219 Gastro-esophageal reflux disease without esophagitis: Secondary | ICD-10-CM | POA: Diagnosis not present

## 2022-11-19 DIAGNOSIS — K7460 Unspecified cirrhosis of liver: Secondary | ICD-10-CM | POA: Diagnosis not present

## 2022-11-19 DIAGNOSIS — I700 Atherosclerosis of aorta: Secondary | ICD-10-CM | POA: Diagnosis not present

## 2022-11-19 DIAGNOSIS — I129 Hypertensive chronic kidney disease with stage 1 through stage 4 chronic kidney disease, or unspecified chronic kidney disease: Secondary | ICD-10-CM | POA: Diagnosis not present

## 2022-11-19 DIAGNOSIS — M8588 Other specified disorders of bone density and structure, other site: Secondary | ICD-10-CM | POA: Diagnosis not present

## 2022-11-19 DIAGNOSIS — E876 Hypokalemia: Secondary | ICD-10-CM | POA: Diagnosis not present

## 2022-11-19 DIAGNOSIS — K5790 Diverticulosis of intestine, part unspecified, without perforation or abscess without bleeding: Secondary | ICD-10-CM | POA: Diagnosis not present

## 2022-11-19 DIAGNOSIS — K602 Anal fissure, unspecified: Secondary | ICD-10-CM | POA: Diagnosis not present

## 2022-11-19 DIAGNOSIS — K4020 Bilateral inguinal hernia, without obstruction or gangrene, not specified as recurrent: Secondary | ICD-10-CM | POA: Diagnosis not present

## 2022-11-19 DIAGNOSIS — E669 Obesity, unspecified: Secondary | ICD-10-CM | POA: Diagnosis not present

## 2022-11-19 DIAGNOSIS — E785 Hyperlipidemia, unspecified: Secondary | ICD-10-CM | POA: Diagnosis not present

## 2022-11-19 DIAGNOSIS — K760 Fatty (change of) liver, not elsewhere classified: Secondary | ICD-10-CM | POA: Diagnosis not present

## 2022-11-19 DIAGNOSIS — Z87891 Personal history of nicotine dependence: Secondary | ICD-10-CM | POA: Diagnosis not present

## 2022-11-19 DIAGNOSIS — F411 Generalized anxiety disorder: Secondary | ICD-10-CM | POA: Diagnosis not present

## 2022-11-22 ENCOUNTER — Other Ambulatory Visit: Payer: Self-pay | Admitting: Family Medicine

## 2022-11-24 ENCOUNTER — Encounter: Payer: Self-pay | Admitting: Family Medicine

## 2022-11-24 ENCOUNTER — Ambulatory Visit (INDEPENDENT_AMBULATORY_CARE_PROVIDER_SITE_OTHER): Payer: Medicare HMO | Admitting: Family Medicine

## 2022-11-24 VITALS — BP 137/75 | HR 85 | Temp 97.9°F | Ht 61.0 in | Wt 184.4 lb

## 2022-11-24 DIAGNOSIS — A09 Infectious gastroenteritis and colitis, unspecified: Secondary | ICD-10-CM | POA: Diagnosis not present

## 2022-11-24 DIAGNOSIS — N170 Acute kidney failure with tubular necrosis: Secondary | ICD-10-CM | POA: Diagnosis not present

## 2022-11-24 DIAGNOSIS — C3431 Malignant neoplasm of lower lobe, right bronchus or lung: Secondary | ICD-10-CM | POA: Diagnosis not present

## 2022-11-24 LAB — CMP14+EGFR
ALT: 16 IU/L (ref 0–32)
AST: 13 IU/L (ref 0–40)
Albumin/Globulin Ratio: 1.7 (ref 1.2–2.2)
Albumin: 3.8 g/dL (ref 3.8–4.8)
Alkaline Phosphatase: 71 IU/L (ref 44–121)
BUN/Creatinine Ratio: 6 — ABNORMAL LOW (ref 12–28)
BUN: 13 mg/dL (ref 8–27)
Bilirubin Total: 0.3 mg/dL (ref 0.0–1.2)
CO2: 23 mmol/L (ref 20–29)
Calcium: 9 mg/dL (ref 8.7–10.3)
Chloride: 105 mmol/L (ref 96–106)
Creatinine, Ser: 2.02 mg/dL — ABNORMAL HIGH (ref 0.57–1.00)
Globulin, Total: 2.2 g/dL (ref 1.5–4.5)
Glucose: 121 mg/dL — ABNORMAL HIGH (ref 70–99)
Potassium: 4.7 mmol/L (ref 3.5–5.2)
Sodium: 142 mmol/L (ref 134–144)
Total Protein: 6 g/dL (ref 6.0–8.5)
eGFR: 25 mL/min/{1.73_m2} — ABNORMAL LOW (ref >59)

## 2022-11-24 LAB — CBC WITH DIFFERENTIAL/PLATELET
Basophils Absolute: 0 10*3/uL (ref 0.0–0.2)
Basos: 1 %
EOS (ABSOLUTE): 0.1 10*3/uL (ref 0.0–0.4)
Eos: 4 %
Hematocrit: 31.9 % — ABNORMAL LOW (ref 34.0–46.6)
Hemoglobin: 10.2 g/dL — ABNORMAL LOW (ref 11.1–15.9)
Immature Grans (Abs): 0 10*3/uL (ref 0.0–0.1)
Immature Granulocytes: 1 %
Lymphocytes Absolute: 0.7 10*3/uL (ref 0.7–3.1)
Lymphs: 23 %
MCH: 26.3 pg — ABNORMAL LOW (ref 26.6–33.0)
MCHC: 32 g/dL (ref 31.5–35.7)
MCV: 82 fL (ref 79–97)
Monocytes Absolute: 0.3 10*3/uL (ref 0.1–0.9)
Monocytes: 10 %
Neutrophils Absolute: 1.9 10*3/uL (ref 1.4–7.0)
Neutrophils: 61 %
Platelets: 292 10*3/uL (ref 150–450)
RBC: 3.88 x10E6/uL (ref 3.77–5.28)
RDW: 14 % (ref 11.7–15.4)
WBC: 3 10*3/uL — ABNORMAL LOW (ref 3.4–10.8)

## 2022-11-24 LAB — MAGNESIUM: Magnesium: 1.9 mg/dL (ref 1.6–2.3)

## 2022-11-24 LAB — PHOSPHORUS: Phosphorus: 3.1 mg/dL (ref 3.0–4.3)

## 2022-11-24 MED ORDER — CLINDAMYCIN HCL 300 MG PO CAPS: 600.0000 mg | ORAL_CAPSULE | Freq: Once | ORAL | 2 refills | Status: AC

## 2022-11-24 NOTE — Progress Notes (Signed)
Subjective:  Patient ID: Renee Maldonado, female    DOB: 06/17/1946  Age: 77 y.o. MRN: [id number]  CC: Hospitalization Follow-up   HPI Renee Maldonado presents for follow up of hospitalization from 12/16 to 11/15/22. Had ATN from profuse viral diarrhea. Has chronic  frequent stools that have always been that way. She is now at her baseline. Drinking water and on soft diet. Needs to see dentist on 19th of this month. Has to use clinda for prophylaxis. Wonders if that is okay. Still ver swollen. Legs are red.Off fluid pill due to ATN. Had multiple electrolyte abnormalities including potassium, magnesium and CO2      11/24/2022   10:14 AM 07/14/2022   10:08 AM 07/14/2022    9:56 AM  Depression screen PHQ 2/9  Decreased Interest 2 0 0  Down, Depressed, Hopeless 3 0 0  PHQ - 2 Score 5 0 0  Altered sleeping 3 0   Tired, decreased energy 2 1   Change in appetite 0 0   Feeling bad or failure about yourself  1 0   Trouble concentrating 2 0   Moving slowly or fidgety/restless 0 0   Suicidal thoughts 1 0   PHQ-9 Score 14 1   Difficult doing work/chores Not difficult at all Not difficult at all     History Renee Maldonado has a past medical history of Anxiety, Arthritis, Cancer ([facility]), COPD (chronic obstructive pulmonary disease) ([facility]), GERD (gastroesophageal reflux disease), Humerus fracture (2007), Hyperlipidemia, Hypertension, Insomnia, OA (osteoarthritis) of knee (10/08/2020), and Thyroid cancer ([facility]) (2023).   She has a past surgical history that includes Total knee arthroplasty; Left shoulder surgery; Joint replacement; Tubal ligation (1978); Breast surgery (Right, 01/2019); Breast lumpectomy (Right, 02/2019); Total knee arthroplasty (Right, 10/08/2020); Bronchial biopsy (04/15/2022); Bronchial needle aspiration biopsy (04/15/2022); Bronchial brushings (04/15/2022); Video bronchoscopy with radial endobronchial ultrasound (04/15/2022); Fiducial marker placement (04/15/2022); Colonoscopy (2022); Thyroidectomy  (Right, 10/10/2022); and Radical neck dissection (Right, 10/10/2022).   Her family history includes Alzheimer's disease in her mother; Cancer in her brother; Diabetes in her mother; Hypertension in her father; Stroke in her father.She reports that she quit smoking about 8 years ago. Her smoking use included cigarettes. She has a 10.00 pack-year smoking history. She has never used smokeless tobacco. She reports current alcohol use of about 4.0 standard drinks of alcohol per week. She reports that she does not use drugs.    ROS Review of Systems  Constitutional: Negative.   HENT: Negative.    Eyes:  Negative for visual disturbance.  Respiratory:  Negative for shortness of breath.   Cardiovascular:  Positive for leg swelling. Negative for chest pain.  Gastrointestinal:  Negative for abdominal pain.  Musculoskeletal:  Negative for arthralgias.    Objective:  BP 137/75   Pulse 85   Temp 97.9 F (36.6 C)   Ht 5\' 1"  (1.549 m)   Wt 184 lb 6.4 oz (83.6 kg)   SpO2 96%   BMI 34.84 kg/m   BP Readings from Last 3 Encounters:  11/24/22 137/75  11/15/22 130/62  10/11/22 (!) 128/56    Wt Readings from Last 3 Encounters:  11/24/22 184 lb 6.4 oz (83.6 kg)  11/02/22 164 lb 10.9 oz (74.7 kg)  10/02/22 187 lb 9.6 oz (85.1 kg)     Physical Exam Constitutional:      General: She is not in acute distress.    Appearance: She is well-developed.  Cardiovascular:     Rate and Rhythm: Normal rate and regular rhythm.  Pulmonary:     Breath sounds: Normal breath sounds.  Musculoskeletal:        General: Normal range of motion.     Right lower leg: Edema (3+) present.     Left lower leg: Edema (2+) present.  Skin:    General: Skin is warm and dry.     Findings: Erythema (stasis dermatitis Both legs) present.  Neurological:     Mental Status: She is alert and oriented to person, place, and time.       Assessment & Plan:   Renee Maldonado was seen today for hospitalization  follow-up.  Diagnoses and all orders for this visit:  ATN (acute tubular necrosis) ([facility]) -     Magnesium -     Phosphorus -     CMP14+EGFR -     CBC with Differential/Platelet  Primary adnenocarcinoma of right lower lobe of lung ([facility]) -     CT Chest W Contrast  Diarrhea of infectious origin  Other orders -     clindamycin (CLEOCIN) 300 MG capsule; Take 2 capsules (600 mg total) by mouth once for 1 dose.       I am having Renee Maldonado start on clindamycin. I am also having her maintain her Restasis, anastrozole, Chlor-Trimeton, albuterol, hydrOXYzine, Calcium Carb-Cholecalciferol, cholecalciferol, traZODone, amLODipine, famotidine, cyanocobalamin, raloxifene, fluticasone furoate-vilanterol, Melatonin, L-Lysine, sodium chloride, Lubricant Eye Drops, rosuvastatin, hydrocortisone, magnesium oxide, cholestyramine, potassium chloride, and valACYclovir.  Allergies as of 11/24/2022       Reactions   Benadryl [diphenhydramine Hcl] Nausea Only   Dizziness   Penicillins    Had a rash around 20 years ago but unsure if it come from medicine.  Tolerated Cephalosporin Date: 10/09/20.        Medication List        Accurate as of November 24, 2022 10:26 AM. If you have any questions, ask your nurse or doctor.          albuterol 108 (90 Base) MCG/ACT inhaler Commonly known as: VENTOLIN HFA 2 puffs in lungs every 6 hours as needed for wheezing/short of breath.   amLODipine 5 MG tablet Commonly known as: NORVASC Take 1 tablet (5 mg total) by mouth daily.   anastrozole 1 MG tablet Commonly known as: ARIMIDEX Take 1 mg by mouth in the morning.   Calcium Carb-Cholecalciferol 600-20 MG-MCG Tabs Take 1 tablet by mouth in the morning.   Chlor-Trimeton 4 MG tablet Generic drug: chlorpheniramine Take 4 mg by mouth See admin instructions. Take 1 tablet (4 mg) by mouth scheduled every night & may take an additional dose in the morning if needed for allergies.   cholecalciferol 25 MCG  (1000 UNIT) tablet Commonly known as: VITAMIN D3 Take 1,000 Units by mouth in the morning.   cholestyramine 4 g packet Commonly known as: QUESTRAN Take 1 packet (4 g total) by mouth daily.   clindamycin 300 MG capsule Commonly known as: CLEOCIN Take 2 capsules (600 mg total) by mouth once for 1 dose. Started by: Claretta Fraise, MD   cyanocobalamin 1000 MCG/ML injection Commonly known as: VITAMIN B12 Inject 1 mL (1,000 mcg total) into the skin every 30 (thirty) days.   famotidine 40 MG tablet Commonly known as: PEPCID Take 1 tablet (40 mg total) by mouth daily.   fluticasone furoate-vilanterol 200-25 MCG/ACT Aepb Commonly known as: Breo Ellipta Inhale 1 puff into the lungs daily. What changed: when to take this   hydrocortisone 2.5 % cream Apply topically in the morning, at noon, in  the evening, and at bedtime. Apply to your anus 4 times a day.  Apothecary hemorrhoid cream with nitroglycerin added   hydrOXYzine 25 MG tablet Commonly known as: ATARAX Take 25 mg by mouth every 4 (four) hours as needed for itching.   L-Lysine 500 MG Tabs Take 500 mg by mouth every evening.   Lubricant Eye Drops 0.4-0.3 % Soln Generic drug: Polyethyl Glycol-Propyl Glycol Place 1-2 drops into both eyes 3 (three) times daily as needed (dry/irritated eyes.).   magnesium oxide 400 (240 Mg) MG tablet Commonly known as: MAG-OX Take 1 tablet (400 mg total) by mouth daily.   Melatonin 10 MG Caps Take 10 mg by mouth at bedtime.   potassium chloride 10 MEQ tablet Commonly known as: Klor-Con 10 Take 1 tablet (10 mEq total) by mouth 2 (two) times daily.   raloxifene 60 MG tablet Commonly known as: EVISTA Take 1 tablet (60 mg total) by mouth daily.   Restasis 0.05 % ophthalmic emulsion Generic drug: cycloSPORINE Place 1 drop into both eyes 2 (two) times daily as needed (dry eyes).   rosuvastatin 20 MG tablet Commonly known as: CRESTOR TAKE 1 TABLET DAILY   sodium chloride 0.65 % Soln nasal  spray Commonly known as: OCEAN Place 1 spray into both nostrils as needed for congestion.   traZODone 100 MG tablet Commonly known as: DESYREL TAKE 1 TABLET AT BEDTIME   valACYclovir 500 MG tablet Commonly known as: VALTREX TAKE (1) TABLET DAILY AS NEEDED.         Follow-up: Return in about 2 weeks (around 12/08/2022) for edema.  Claretta Fraise, M.D.

## 2022-11-27 ENCOUNTER — Ambulatory Visit (INDEPENDENT_AMBULATORY_CARE_PROVIDER_SITE_OTHER): Payer: Medicare HMO

## 2022-11-27 DIAGNOSIS — K760 Fatty (change of) liver, not elsewhere classified: Secondary | ICD-10-CM

## 2022-11-27 DIAGNOSIS — I129 Hypertensive chronic kidney disease with stage 1 through stage 4 chronic kidney disease, or unspecified chronic kidney disease: Secondary | ICD-10-CM | POA: Diagnosis not present

## 2022-11-27 DIAGNOSIS — K219 Gastro-esophageal reflux disease without esophagitis: Secondary | ICD-10-CM | POA: Diagnosis not present

## 2022-11-27 DIAGNOSIS — J4489 Other specified chronic obstructive pulmonary disease: Secondary | ICD-10-CM

## 2022-11-27 DIAGNOSIS — E785 Hyperlipidemia, unspecified: Secondary | ICD-10-CM | POA: Diagnosis not present

## 2022-11-27 DIAGNOSIS — N170 Acute kidney failure with tubular necrosis: Secondary | ICD-10-CM | POA: Diagnosis not present

## 2022-11-27 DIAGNOSIS — M8588 Other specified disorders of bone density and structure, other site: Secondary | ICD-10-CM

## 2022-11-27 DIAGNOSIS — N1831 Chronic kidney disease, stage 3a: Secondary | ICD-10-CM | POA: Diagnosis not present

## 2022-11-27 DIAGNOSIS — I700 Atherosclerosis of aorta: Secondary | ICD-10-CM

## 2022-11-27 DIAGNOSIS — F411 Generalized anxiety disorder: Secondary | ICD-10-CM

## 2022-11-27 DIAGNOSIS — M1711 Unilateral primary osteoarthritis, right knee: Secondary | ICD-10-CM

## 2022-11-27 DIAGNOSIS — K5790 Diverticulosis of intestine, part unspecified, without perforation or abscess without bleeding: Secondary | ICD-10-CM

## 2022-11-27 DIAGNOSIS — A0839 Other viral enteritis: Secondary | ICD-10-CM | POA: Diagnosis not present

## 2022-11-27 DIAGNOSIS — K7460 Unspecified cirrhosis of liver: Secondary | ICD-10-CM

## 2022-11-27 DIAGNOSIS — K449 Diaphragmatic hernia without obstruction or gangrene: Secondary | ICD-10-CM

## 2022-11-27 DIAGNOSIS — K602 Anal fissure, unspecified: Secondary | ICD-10-CM

## 2022-11-27 DIAGNOSIS — E876 Hypokalemia: Secondary | ICD-10-CM | POA: Diagnosis not present

## 2022-11-27 DIAGNOSIS — K4020 Bilateral inguinal hernia, without obstruction or gangrene, not specified as recurrent: Secondary | ICD-10-CM

## 2022-11-27 DIAGNOSIS — E872 Acidosis, unspecified: Secondary | ICD-10-CM

## 2022-11-27 DIAGNOSIS — R69 Illness, unspecified: Secondary | ICD-10-CM | POA: Diagnosis not present

## 2022-12-01 ENCOUNTER — Telehealth: Payer: Self-pay | Admitting: Family Medicine

## 2022-12-01 NOTE — Telephone Encounter (Signed)
Okay, but keep it to minimal, essential trips

## 2022-12-02 ENCOUNTER — Telehealth: Payer: Self-pay | Admitting: Family Medicine

## 2022-12-02 NOTE — Telephone Encounter (Signed)
Yes

## 2022-12-02 NOTE — Telephone Encounter (Signed)
Pt has been notified.

## 2022-12-02 NOTE — Telephone Encounter (Signed)
Patient aware. She states she will only drive for essential needs.

## 2022-12-03 ENCOUNTER — Ambulatory Visit (INDEPENDENT_AMBULATORY_CARE_PROVIDER_SITE_OTHER): Payer: Medicare HMO | Admitting: *Deleted

## 2022-12-03 DIAGNOSIS — E538 Deficiency of other specified B group vitamins: Secondary | ICD-10-CM | POA: Diagnosis not present

## 2022-12-03 MED ORDER — CYANOCOBALAMIN 1000 MCG/ML IJ SOLN
1000.0000 ug | Freq: Once | INTRAMUSCULAR | Status: AC
  Administered 2022-12-03: 1000 ug via INTRAMUSCULAR

## 2022-12-03 NOTE — Progress Notes (Signed)
B12 injection given right deltoid, intramuscular, patient supplied.  Patient tolerated well

## 2022-12-09 ENCOUNTER — Ambulatory Visit (INDEPENDENT_AMBULATORY_CARE_PROVIDER_SITE_OTHER): Payer: Medicare HMO | Admitting: Family Medicine

## 2022-12-09 ENCOUNTER — Encounter: Payer: Self-pay | Admitting: Family Medicine

## 2022-12-09 VITALS — BP 131/71 | HR 84 | Temp 97.3°F | Ht 61.0 in | Wt 172.4 lb

## 2022-12-09 DIAGNOSIS — A09 Infectious gastroenteritis and colitis, unspecified: Secondary | ICD-10-CM | POA: Diagnosis not present

## 2022-12-09 DIAGNOSIS — I10 Essential (primary) hypertension: Secondary | ICD-10-CM

## 2022-12-09 LAB — BMP8+EGFR
BUN/Creatinine Ratio: 11 — ABNORMAL LOW (ref 12–28)
BUN: 20 mg/dL (ref 8–27)
CO2: 18 mmol/L — ABNORMAL LOW (ref 20–29)
Calcium: 9.6 mg/dL (ref 8.7–10.3)
Chloride: 101 mmol/L (ref 96–106)
Creatinine, Ser: 1.77 mg/dL — ABNORMAL HIGH (ref 0.57–1.00)
Glucose: 109 mg/dL — ABNORMAL HIGH (ref 70–99)
Potassium: 5.2 mmol/L (ref 3.5–5.2)
Sodium: 137 mmol/L (ref 134–144)
eGFR: 29 mL/min/{1.73_m2} — ABNORMAL LOW (ref >59)

## 2022-12-09 MED ORDER — VALACYCLOVIR HCL 1 G PO TABS: 2000.0000 mg | ORAL_TABLET | Freq: Two times a day (BID) | ORAL | 5 refills | Status: DC

## 2022-12-09 NOTE — Progress Notes (Signed)
Subjective:  Patient ID: Renee Maldonado, female    DOB: 1946-06-07  Age: 77 y.o. MRN: [id number]  CC: Follow-up and Edema   HPI Renee Maldonado presents for recheck of edema and diarrhea. Stools formed. Occurring now 3-4 a day.   Diet - eating frozen foods like chicken pot pie. Anything chicken works well. [employer] sent out 14 frozen meals.      12/09/2022    9:30 AM 12/09/2022    9:24 AM 11/24/2022   10:14 AM  Depression screen PHQ 2/9  Decreased Interest 1 0 2  Down, Depressed, Hopeless 0 0 3  PHQ - 2 Score 1 0 5  Altered sleeping 1  3  Tired, decreased energy 1  2  Change in appetite 1  0  Feeling bad or failure about yourself  0  1  Trouble concentrating 0  2  Moving slowly or fidgety/restless 0  0  Suicidal thoughts 0  1  PHQ-9 Score 4  14  Difficult doing work/chores Not difficult at all  Not difficult at all    History Renee Maldonado has a past medical history of Anxiety, Arthritis, Cancer ([facility]), COPD (chronic obstructive pulmonary disease) ([facility]), GERD (gastroesophageal reflux disease), Humerus fracture (2007), Hyperlipidemia, Hypertension, Insomnia, OA (osteoarthritis) of knee (10/08/2020), and Thyroid cancer ([facility]) (2023).   She has a past surgical history that includes Total knee arthroplasty; Left shoulder surgery; Joint replacement; Tubal ligation (1978); Breast surgery (Right, 01/2019); Breast lumpectomy (Right, 02/2019); Total knee arthroplasty (Right, 10/08/2020); Bronchial biopsy (04/15/2022); Bronchial needle aspiration biopsy (04/15/2022); Bronchial brushings (04/15/2022); Video bronchoscopy with radial endobronchial ultrasound (04/15/2022); Fiducial marker placement (04/15/2022); Colonoscopy (2022); Thyroidectomy (Right, 10/10/2022); and Radical neck dissection (Right, 10/10/2022).   Her family history includes Alzheimer's disease in her mother; Cancer in her brother; Diabetes in her mother; Hypertension in her father; Stroke in her father.She reports that she quit smoking about  8 years ago. Her smoking use included cigarettes. She has a 10.00 pack-year smoking history. She has never used smokeless tobacco. She reports current alcohol use of about 4.0 standard drinks of alcohol per week. She reports that she does not use drugs.    ROS Review of Systems  Objective:  BP 131/71   Pulse 84   Temp (!) 97.3 F (36.3 C)   Ht 5\' 1"  (1.549 m)   Wt 172 lb 6.4 oz (78.2 kg)   SpO2 97%   BMI 32.57 kg/m   BP Readings from Last 3 Encounters:  12/09/22 131/71  11/24/22 137/75  11/15/22 130/62    Wt Readings from Last 3 Encounters:  12/09/22 172 lb 6.4 oz (78.2 kg)  11/24/22 184 lb 6.4 oz (83.6 kg)  11/02/22 164 lb 10.9 oz (74.7 kg)     Physical Exam Constitutional:      General: She is not in acute distress.    Appearance: She is well-developed.  Cardiovascular:     Rate and Rhythm: Normal rate and regular rhythm.  Pulmonary:     Breath sounds: Normal breath sounds.  Musculoskeletal:        General: Normal range of motion.  Skin:    General: Skin is warm and dry.  Neurological:     Mental Status: She is alert and oriented to person, place, and time.       Assessment & Plan:   Renee Maldonado was seen today for follow-up and edema.  Diagnoses and all orders for this visit:  Primary hypertension -     BMP8+EGFR  Diarrhea of infectious  origin  Other orders -     valACYclovir (VALTREX) 1000 MG tablet; Take 2 tablets (2,000 mg total) by mouth 2 (two) times daily for 1 day. For cold sores       I have discontinued Verlinda Chieffo's valACYclovir. I am also having her start on valACYclovir. Additionally, I am having her maintain her Restasis, anastrozole, Chlor-Trimeton, albuterol, hydrOXYzine, Calcium Carb-Cholecalciferol, cholecalciferol, traZODone, amLODipine, famotidine, cyanocobalamin, raloxifene, fluticasone furoate-vilanterol, Melatonin, L-Lysine, sodium chloride, Lubricant Eye Drops, rosuvastatin, hydrocortisone, magnesium oxide, cholestyramine, and  potassium chloride.  Allergies as of 12/09/2022       Reactions   Benadryl [diphenhydramine Hcl] Nausea Only   Dizziness   Penicillins    Had a rash around 20 years ago but unsure if it come from medicine.  Tolerated Cephalosporin Date: 10/09/20.        Medication List        Accurate as of December 09, 2022  8:37 PM. If you have any questions, ask your nurse or doctor.          albuterol 108 (90 Base) MCG/ACT inhaler Commonly known as: VENTOLIN HFA 2 puffs in lungs every 6 hours as needed for wheezing/short of breath.   amLODipine 5 MG tablet Commonly known as: NORVASC Take 1 tablet (5 mg total) by mouth daily.   anastrozole 1 MG tablet Commonly known as: ARIMIDEX Take 1 mg by mouth in the morning.   Calcium Carb-Cholecalciferol 600-20 MG-MCG Tabs Take 1 tablet by mouth in the morning.   Chlor-Trimeton 4 MG tablet Generic drug: chlorpheniramine Take 4 mg by mouth See admin instructions. Take 1 tablet (4 mg) by mouth scheduled every night & may take an additional dose in the morning if needed for allergies.   cholecalciferol 25 MCG (1000 UNIT) tablet Commonly known as: VITAMIN D3 Take 1,000 Units by mouth in the morning.   cholestyramine 4 g packet Commonly known as: QUESTRAN Take 1 packet (4 g total) by mouth daily.   cyanocobalamin 1000 MCG/ML injection Commonly known as: VITAMIN B12 Inject 1 mL (1,000 mcg total) into the skin every 30 (thirty) days.   famotidine 40 MG tablet Commonly known as: PEPCID Take 1 tablet (40 mg total) by mouth daily.   fluticasone furoate-vilanterol 200-25 MCG/ACT Aepb Commonly known as: Breo Ellipta Inhale 1 puff into the lungs daily. What changed: when to take this   hydrocortisone 2.5 % cream Apply topically in the morning, at noon, in the evening, and at bedtime. Apply to your anus 4 times a day.  Apothecary hemorrhoid cream with nitroglycerin added   hydrOXYzine 25 MG tablet Commonly known as: ATARAX Take 25 mg by  mouth every 4 (four) hours as needed for itching.   L-Lysine 500 MG Tabs Take 500 mg by mouth every evening.   Lubricant Eye Drops 0.4-0.3 % Soln Generic drug: Polyethyl Glycol-Propyl Glycol Place 1-2 drops into both eyes 3 (three) times daily as needed (dry/irritated eyes.).   magnesium oxide 400 (240 Mg) MG tablet Commonly known as: MAG-OX Take 1 tablet (400 mg total) by mouth daily.   Melatonin 10 MG Caps Take 10 mg by mouth at bedtime.   potassium chloride 10 MEQ tablet Commonly known as: Klor-Con 10 Take 1 tablet (10 mEq total) by mouth 2 (two) times daily.   raloxifene 60 MG tablet Commonly known as: EVISTA Take 1 tablet (60 mg total) by mouth daily.   Restasis 0.05 % ophthalmic emulsion Generic drug: cycloSPORINE Place 1 drop into both eyes 2 (two) times  daily as needed (dry eyes).   rosuvastatin 20 MG tablet Commonly known as: CRESTOR TAKE 1 TABLET DAILY   sodium chloride 0.65 % Soln nasal spray Commonly known as: OCEAN Place 1 spray into both nostrils as needed for congestion.   traZODone 100 MG tablet Commonly known as: DESYREL TAKE 1 TABLET AT BEDTIME   valACYclovir 1000 MG tablet Commonly known as: Valtrex Take 2 tablets (2,000 mg total) by mouth 2 (two) times daily for 1 day. For cold sores What changed:  medication strength See the new instructions. Changed by: [name], MD         Follow-up: Return in about 1 month (around 01/09/2023).  [name], M.D.

## 2022-12-15 ENCOUNTER — Other Ambulatory Visit: Payer: Self-pay | Admitting: *Deleted

## 2022-12-15 ENCOUNTER — Telehealth: Payer: Self-pay | Admitting: Family Medicine

## 2022-12-15 MED ORDER — MAGNESIUM OXIDE -MG SUPPLEMENT 400 (240 MG) MG PO TABS: 400.0000 mg | ORAL_TABLET | Freq: Every day | ORAL | 3 refills | Status: DC

## 2022-12-15 MED ORDER — POTASSIUM CHLORIDE ER 10 MEQ PO TBCR: 10.0000 meq | EXTENDED_RELEASE_TABLET | Freq: Every day | ORAL | 3 refills | Status: DC

## 2022-12-15 NOTE — Telephone Encounter (Signed)
Patient aware

## 2022-12-15 NOTE — Telephone Encounter (Signed)
Continue magnesium and phosphorus. Can decrease the potassium to one a day

## 2022-12-16 ENCOUNTER — Encounter: Payer: Self-pay | Admitting: Surgery

## 2022-12-16 ENCOUNTER — Ambulatory Visit: Payer: Medicare HMO | Admitting: Surgery

## 2022-12-16 VITALS — BP 142/78 | HR 85 | Temp 98.4°F | Resp 16 | Ht 61.0 in | Wt 177.0 lb

## 2022-12-16 DIAGNOSIS — K626 Ulcer of anus and rectum: Secondary | ICD-10-CM | POA: Diagnosis not present

## 2022-12-16 NOTE — Progress Notes (Signed)
[facility] Note   HPI:  77 y.o. Female presents to clinic for follow-up after inpatient evaluation of an anal ulceration.  The patient has been feeling significantly better since he left the hospital.  She currently denies any pain with bowel movements.  She is applying her nitroglycerin compounded hemorrhoid ointment without any issues.  She is also using wet wipes to clean herself after bowel movements.  She will occasionally notice a small dot of blood on toilet paper with wiping, though she states that this is usually from her hemorrhoids.  She denies any fevers or chills.  She is currently having 3-4 soft bowel movements per day.  She denies any diarrhea.  She is tolerating a diet without issue.  Review of Systems:  All other review of systems: otherwise negative   Vital Signs:  BP (!) 142/78   Pulse 85   Temp 98.4 F (36.9 C) (Oral)   Resp 16   Ht 5\' 1"  (1.549 m)   Wt 177 lb (80.3 kg)   SpO2 97%   BMI 33.44 kg/m    Physical Exam:  Physical Exam Vitals reviewed.  Constitutional:      Appearance: Normal appearance.  Abdominal:     General: There is no distension.     Palpations: Abdomen is soft.     Tenderness: There is no abdominal tenderness.  Genitourinary:    Comments: Anus with almost completely healed ulceration at the 6 o'clock position, nontender Neurological:     Mental Status: She is alert.     Laboratory studies: None  Imaging:  None  Assessment:  77 y.o. yo Female who presents for follow-up status post evaluation in hospital for anal ulceration.  Plan:  -I explained to the patient that the area at her anus is significantly improved from evaluation in the hospital, and it appears almost completely healed -I recommend that she use the remaining amount of her nitroglycerin compounded hemorrhoid ointment -Continue to use wet wipes with bowel movements -If she begins to have issues with constipation, advised her to begin taking a stool  softener.  If she still has issues with constipation, advised her to take an over-the-counter laxative -I advised her to call our office if she begins to have any pain with bowel movements or pain with wiping -We discussed some of her electrolyte replacement prescriptions that her primary care doctor sent in for her.  I emphasized that if her primary care doctor recommended these medications, then she should continue taking them -She was asking about whether she needs a refill of her cholestyramine, which was prescribed while she was inpatient.  Explained to her that this is sometimes used to treat diarrhea, which was one of the main issues while she remained inpatient.  She has follow-up scheduled with GI next week, and I advised her to ask them if they want her to continue this medication -Follow up as needed -I spent a 30 minutes in the patient's room discussing her questions, concerns, and treatment recommendations for her anal ulceration, and a total of greater than 40 minutes was spent on coordination of patient care, evaluation, and discussion with patient  All of the above recommendations were discussed with the patient, and all of patient's  questions were answered to her expressed satisfaction.  Graciella Freer, DO [facility] [address] [place], [place] [address] [phone] (office)

## 2022-12-16 NOTE — Patient Instructions (Signed)
-  Finish off the remainder of your anal cream -Continue with your current diet -If you begin to become constipated, take a stool softener daily -If the stool softener does not help with constipation, take an over the counter laxative (miralax, milk of magnesia, or magnesium citrate) -Call if you start having pain with bowel movements or with wiping after bowel movements

## 2022-12-17 NOTE — H&P (View-Only) (Signed)
Referring Provider: Claretta Fraise, MD Primary Care Physician:  Claretta Fraise, MD Primary GI Physician: Dr. Abbey Chatters  Chief Complaint  Patient presents with   Hospitalization Follow-up    Newly diagnosed with cirrhosis.    HPI:   Renee Maldonado is a 77 y.o. female with history of anxiety, arthritis, breast cancer, COPD, CKD, GERD, HLD, HTN, OA, thyroid cancer, lung cancer, who is presenting today for hospital follow-up of diarrhea and new cirrhosis.  Patient was admitted to the hospital in December 2023 after presenting with profuse watery diarrhea x 1 week.   CT A/P without contrast with findings consistent with ileus/enteritis, hepatic cirrhosis with mild steatosis, small 1 cm right lower lobe groundglass nodule, small hiatal hernia, small inguinal fat hernias.  Stool testing was positive for adenovirus.  She was treated supportively with Imodium, Lomotil, and cholestyramine twice daily.  She had significant improvement in diarrhea and was not requiring Imodium or Lomotil by discharge.  Unfortunately, she developed rectal pain during admission with concern for perianal abscess.  General surgery consulted and she was found to perianal ulceration at the 6 o'clock position with recommendations for supportive treatment with Anusol suppositories while inpatient and hemorrhoid cream compounded with nitroglycerin at discharge.  Regarding her cirrhosis, recommended outpatient management.  She had follow-up with general surgery 12/16/2022 and reported feeling better since discharge.  She was using nitroglycerin compounded hemorrhoid cream and denied any pain with bowel movements.  On exam, she was noted to have almost completely healed ulceration.  Today:  Diarrhea:  Resolved. Not taking anything to prevent diarrhea at this time. Stools are essentially back to normal. No rectal pain. No blood in the stool. Couple episodes of scant toilet tissue hematochezia a total of 2 times which she thinks is from  her hemorrhoids.  Cirrhosis:  Labs: 11/24/2022 with LFTs and platelets normal.  No recent INR on file.  Unable to calculate MELD.  Last imaging: CT a/p w/o contrast 11/01/22. No HCC. [place] due June 2024.  Hep A/B vaccination: Unknown.  EGD: No prior  Ascites/peripheral edema: No Diuretics: No  Encephalopathy: No Jaundice: No Pruritus: No Bruising/bleeding: No melena. 2 episodes of scant toilet tissue hematochezia.   Alcohol: May drink daily for a week then nothing for months. 4 oz of vodka or whisky tops per day when she does drink.  Illicit drug use: None Tobacco: Quit August/September 2023.  FHX autoimmune disease/liver disease: No   Denies abdominal pian, nausea, vomiting, dysphagia, heartburn, unintentional weight loss.   Last Colonoscopy: April 2022, [facility].  Unable to review report. States she will not have another.    Past Medical History:  Diagnosis Date   Anxiety    Arthritis    Cancer ([place])    breast Right no chemo or radiation  on Arimidex   COPD (chronic obstructive pulmonary disease) ([place])    on Breo   GERD (gastroesophageal reflux disease)    Humerus fracture 2007   Hyperlipidemia    Hypertension    Insomnia    Lung cancer ([place])    OA (osteoarthritis) of knee 10/08/2020   Thyroid cancer ([place]) 2023    Past Surgical History:  Procedure Laterality Date   BREAST LUMPECTOMY Right 02/2019   BREAST SURGERY Right 01/2019   breast biopsy   BRONCHIAL BIOPSY  04/15/2022   Procedure: BRONCHIAL BIOPSIES;  Surgeon: Garner Nash, DO;  Location: [facility] [facility];  Service: Pulmonary;;   BRONCHIAL BRUSHINGS  04/15/2022   Procedure: BRONCHIAL BRUSHINGS;  Surgeon: June Leap  L, DO;  Location: [place] [facility];  Service: Pulmonary;;   BRONCHIAL NEEDLE ASPIRATION BIOPSY  04/15/2022   Procedure: BRONCHIAL NEEDLE ASPIRATION BIOPSIES;  Surgeon: Garner Nash, DO;  Location: [place] [facility];  Service: Pulmonary;;   COLONOSCOPY  2022   FIDUCIAL MARKER PLACEMENT  04/15/2022    Procedure: FIDUCIAL MARKER PLACEMENT;  Surgeon: Garner Nash, DO;  Location: [facility];  Service: Pulmonary;;   JOINT REPLACEMENT     left knee   Left shoulder surgery     2007   RADICAL NECK DISSECTION Right 10/10/2022   Procedure: [place];  Surgeon: Jenetta Downer, MD;  Location: [facility];  Service: ENT;  Laterality: Right;   THYROIDECTOMY Right 10/10/2022   Procedure: RIGHT THYROID LOBECTOMY;  Surgeon: Jenetta Downer, MD;  Location: [facility];  Service: ENT;  Laterality: Right;   TOTAL KNEE ARTHROPLASTY     2012 left   TOTAL KNEE ARTHROPLASTY Right 10/08/2020   Procedure: TOTAL KNEE ARTHROPLASTY;  Surgeon: Gaynelle Arabian, MD;  Location: [facility];  Service: Orthopedics;  Laterality: Right;   TUBAL LIGATION  1978   VIDEO BRONCHOSCOPY WITH RADIAL ENDOBRONCHIAL ULTRASOUND  04/15/2022   Procedure: VIDEO BRONCHOSCOPY WITH RADIAL ENDOBRONCHIAL ULTRASOUND;  Surgeon: Garner Nash, DO;  Location: [facility];  Service: Pulmonary;;    Current Outpatient Medications  Medication Sig Dispense Refill   albuterol (VENTOLIN HFA) 108 (90 Base) MCG/ACT inhaler 2 puffs in lungs every 6 hours as needed for wheezing/short of breath. 8.5 g 5   amLODipine (NORVASC) 5 MG tablet Take 1 tablet (5 mg total) by mouth daily. 90 tablet 3   anastrozole (ARIMIDEX) 1 MG tablet Take 1 mg by mouth in the morning.     Calcium Carb-Cholecalciferol 600-20 MG-MCG TABS Take 1 tablet by mouth in the morning.     chlorpheniramine (CHLOR-TRIMETON) 4 MG tablet Take 4 mg by mouth See admin instructions. Take 1 tablet (4 mg) by mouth scheduled every night & may take an additional dose in the morning if needed for allergies.     cholecalciferol (VITAMIN D3) 25 MCG (1000 UNIT) tablet Take 1,000 Units by mouth in the morning.     cyanocobalamin (VITAMIN B12) 1000 MCG/ML injection Inject 1 mL (1,000 mcg total) into the skin every 30 (thirty) days. 1 mL 11   famotidine (PEPCID) 40 MG tablet Take 1 tablet (40 mg total)  by mouth daily. 90 tablet 2   fluticasone furoate-vilanterol (BREO ELLIPTA) 200-25 MCG/ACT AEPB Inhale 1 puff into the lungs daily. (Patient taking differently: Inhale 1 puff into the lungs 4 (four) times a week.) 1 each 11   hydrochlorothiazide (HYDRODIURIL) 25 MG tablet Take 1 tablet by mouth daily.     hydrocortisone 2.5 % cream Apply topically in the morning, at noon, in the evening, and at bedtime. Apply to your anus 4 times a day.  Apothecary hemorrhoid cream with nitroglycerin added 30 g 0   hydrOXYzine (ATARAX/VISTARIL) 25 MG tablet Take 25 mg by mouth every 4 (four) hours as needed for itching.     L-Lysine 500 MG TABS Take 500 mg by mouth every evening.     magnesium oxide (MAG-OX) 400 (240 Mg) MG tablet Take 1 tablet (400 mg total) by mouth daily. 90 tablet 3   Melatonin 10 MG CAPS Take 10 mg by mouth at bedtime.     ondansetron (ZOFRAN) 4 MG tablet TAKE 1 TABLET EVERY 8 HOURS AS NEEDED FOR NAUSEA OR VOMITING     Polyethyl Glycol-Propyl Glycol (LUBRICANT  EYE DROPS) 0.4-0.3 % SOLN Place 1-2 drops into both eyes 3 (three) times daily as needed (dry/irritated eyes.).     potassium chloride (KLOR-CON 10) 10 MEQ tablet Take 1 tablet (10 mEq total) by mouth daily. 180 tablet 3   raloxifene (EVISTA) 60 MG tablet Take 1 tablet (60 mg total) by mouth daily. 90 tablet 2   RESTASIS 0.05 % ophthalmic emulsion Place 1 drop into both eyes 2 (two) times daily as needed (dry eyes).      rosuvastatin (CRESTOR) 20 MG tablet TAKE 1 TABLET DAILY (Patient taking differently: Take 20 mg by mouth daily.) 90 tablet 0   sodium chloride (OCEAN) 0.65 % SOLN nasal spray Place 1 spray into both nostrils as needed for congestion.     traZODone (DESYREL) 100 MG tablet TAKE 1 TABLET AT BEDTIME (Patient taking differently: Take 100 mg by mouth at bedtime.) 90 tablet 3   No current facility-administered medications for this visit.    Allergies as of 12/18/2022 - Review Complete 12/18/2022  Allergen Reaction Noted    Benadryl [diphenhydramine hcl] Nausea Only 06/29/2013   Penicillins  06/29/2013    Family History  Problem Relation Age of Onset   Alzheimer's disease Mother    Diabetes Mother    Stroke Father    Hypertension Father    Cancer Brother    Liver disease Neg Hx     Social History   Socioeconomic History   Marital status: Married    Spouse name: Jori Moll   Number of children: 3   Years of education: 16   Highest education level: Bachelor's degree (e.g., BA, AB, BS)  Occupational History   Occupation: retired  Tobacco Use   Smoking status: Former    Packs/day: 0.25    Years: 40.00    Total pack years: 10.00    Types: Cigarettes    Quit date: 06/17/2022    Years since quitting: 0.5   Smokeless tobacco: Never   Tobacco comments:    smokes 3 a day  Vaping Use   Vaping Use: Former   Start date: 07/18/2018   Quit date: 07/19/2019  Substance and Sexual Activity   Alcohol use: Yes    Alcohol/week: 4.0 standard drinks of alcohol    Types: 4 Standard drinks or equivalent per week    Comment: occasional, up to 4oz of vodka or whiskey per day for a week, then nothing for months.   Drug use: Never   Sexual activity: Not Currently  Other Topics Concern   Not on file  Social History Narrative   Lives home with her husband. Enjoys travelling, playing bingo, exercising at [facility]creation center, eats out a lot   Social Determinants of Health   Financial Resource Strain: Low Risk  (11/18/2022)   Overall Financial Resource Strain (CARDIA)    Difficulty of Paying Living Expenses: Not hard at all  Food Insecurity: No Food Insecurity (11/01/2022)   Hunger Vital Sign    Worried About Running Out of Food in the Last Year: Never true    Ran Out of Food in the Last Year: Never true  Transportation Needs: No Transportation Needs (11/18/2022)   PRAPARE - Hydrologist (Medical): No    Lack of Transportation (Non-Medical): No  Physical Activity: Insufficiently Active  (08/02/2021)   Exercise Vital Sign    Days of Exercise per Week: 4 days    Minutes of Exercise per Session: 30 min  Stress: No Stress Concern Present (08/02/2021)  [place]    Feeling of Stress : Not at all  Social Connections: Socially Integrated (08/02/2021)   Social Connection and Isolation Panel [NHANES]    Frequency of Communication with Friends and Family: More than three times a week    Frequency of Social Gatherings with Friends and Family: More than three times a week    Attends Religious Services: More than 4 times per year    Active Member of [employer] or Organizations: Yes    Attends Music therapist: More than 4 times per year    Marital Status: Married    Review of Systems: Gen: Denies fever, chills, cold or flu like symptoms, presyncope, syncope. CV: Denies chest pain, palpitations. Resp: Denies dyspnea at rest.  Admits to chronic intermittent cough. GI: See HPI Derm: Denies rash, pruritus Psych: Denies depression, anxiety. Heme: see HPI  Physical Exam: BP 113/75 (BP Location: Right Arm, Patient Position: Sitting, Cuff Size: Large)   Pulse 85   Temp 98.1 F (36.7 C) (Oral)   Ht 5\' 1"  (1.549 m)   Wt 174 lb (78.9 kg)   SpO2 97%   BMI 32.88 kg/m  General:   Alert and oriented. No distress noted. Pleasant and cooperative.  Head:  Normocephalic and atraumatic. Eyes:  Conjuctiva clear without scleral icterus. Heart:  S1, S2 present without murmurs appreciated. Lungs:  Clear to auscultation bilaterally. No wheezes, rales, or rhonchi. No distress.  Abdomen:  +BS, soft, non-tender and non-distended. No rebound or guarding. No HSM or masses noted. Msk:  Symmetrical without gross deformities. Normal posture. Extremities:  With trace lower extremity edema. Neurologic:  Alert and  oriented x4 Psych:  Normal mood and affect.    Assessment:  77 y.o. female with history of anxiety, arthritis,  breast cancer, COPD, CKD, GERD, HLD, HTN, OA, thyroid cancer, lung cancer, who is presenting today for hospital follow-up of diarrhea and new cirrhosis.  Diarrhea: Secondary to adenovirus.  Now resolved with bowels essentially returned to normal.  No alarm symptoms.  She does report 2 episodes of scant toilet tissue hematochezia while having diarrhea which may have been secondary to perianal ulceration or hemorrhoids.  Last colonoscopy in 2022 at [facility], unable to review report.  Cirrhosis: Newly diagnosed.  This was an incidental finding on CT 11/01/2022 during hospitalization.  Suspect etiology to be NASH/ETOH.  She is still drinking alcohol intermittently.   Encouragingly, she is not presenting with any decompensation.  No INR on file to calculate MELD, but recent LFTs and platelets within normal limits. Kidney function slowly improving following recent AKI secondary to diarrhea.  No evidence of focal liver lesion on CT.  Will obtain labs to evaluate for viral hepatitis as well as immunity to hepatitis A and B, AIH, PBC, Wilson's disease, alpha-1 antitrypsin deficiency, hemochromatosis. She is also due for first ever EGD for variceal screening.    Plan:  INR, CBC, HFP, AFP, hepatitis A antibody total, hepatitis B surface antibody, hepatitis B surface antigen, hepatitis B core antibody, hepatitis C antibody, ANA, AMA, ASMA, immunoglobulins, iron panel, ceruloplasmin, alpha-1 antitrypsin Proceed with upper endoscopy with propofol by Dr. Abbey Chatters in near future. The risks, benefits, and alternatives have been discussed with the patient in detail. The patient states understanding and desires to proceed.  ASA 3 RUQ [place] in June 2024 Nutrition recommendations:  High-protein diet from a primarily plant-based diet. Avoid red meat.  No raw or undercooked meat, seafood, or shellfish. Low-fat/cholesterol/carbohydrate diet.  Limit sodium to no more than 2000 mg/day including everything that you eat and  drink. Counseled on strict alcohol avoidance.  No more than 2000 mg of Tylenol per 24 hours. Advised to monitor for abdominal distention, lower extremity edema, jaundice, scleral icterus, bruising/bleeding, mental status changes, and let us know if this occurs. Request colonoscopy records.  Follow-up in 6 months or sooner if needed.     Aliene Altes, PA-C [facility] 12/18/2022

## 2022-12-17 NOTE — Progress Notes (Unsigned)
Referring Provider: [name], MD Primary Care Physician:  [name], MD Primary GI Physician: Dr. Marletta Lor  No chief complaint on file.   HPI:   Renee Maldonado is a 77 y.o. female with history of anxiety, arthritis, breast cancer, COPD, CKD, GERD, HLD, HTN, OA, thyroid cancer who is presenting today for hospital follow-up of diarrhea and new cirrhosis.  Patient was admitted to the hospital in December 2023 after presenting with profuse watery diarrhea x 1 week.   CT A/P without contrast with findings consistent with ileus/enteritis, hepatic cirrhosis with mild steatosis, small 1 cm right lower lobe groundglass nodule, small hiatal hernia, small inguinal fat hernias.  Stool testing was positive for adenovirus.  She was treated supportively with Imodium, Lomotil, and cholestyramine twice daily.  She had significant improvement in diarrhea and was not requiring Imodium or Lomotil discharge.  Unfortunately, she developed rectal pain during admission with perianal abscess.  General surgery, cold.  She was found to perianal ulceration at the 6 o'clock position with recommendations for supportive treatment with Anusol suppositories while inpatient and hemorrhoid cream compounded with nitroglycerin at discharge.  Regarding her cirrhosis, recommended outpatient management.  She had follow-up with general surgery 12/16/2022 and reported feeling better will discharge.  She was using nitroglycerin compounded hemorrhoid cream and denied any pain with bowel movements.  On exam, she was noted to have almost completely healed ulceration.  Today:  Diarrhea:   Cirrhosis:  Labs: 11/24/2022 with LFTs and platelets normal.  No recent INR on file.  Unable to calculate MELD.  Last imaging: CT a/p w/o contrast 11/01/22. No HCC. [place] due June 2024.  Hep A/B vaccination:  EGD: No prior Ascites/peripheral edema:  Diuretics:  Encephalopathy:  Jaundice: Pruritus: Bruising/bleeding:     Last Colonoscopy:  April 2022, [facility].  Unable to review report.  Past Medical History:  Diagnosis Date   Anxiety    Arthritis    Cancer (HCC)    breast Right no chemo or radiation  on Arimidex   COPD (chronic obstructive pulmonary disease) (HCC)    on Breo   GERD (gastroesophageal reflux disease)    Humerus fracture 2007   Hyperlipidemia    Hypertension    Insomnia    OA (osteoarthritis) of knee 10/08/2020   Thyroid cancer (HCC) 2023    Past Surgical History:  Procedure Laterality Date   BREAST LUMPECTOMY Right 02/2019   BREAST SURGERY Right 01/2019   breast biopsy   BRONCHIAL BIOPSY  04/15/2022   Procedure: BRONCHIAL BIOPSIES;  Surgeon: [name], DO;  Location: [facility];  Service: Pulmonary;;   BRONCHIAL BRUSHINGS  04/15/2022   Procedure: BRONCHIAL BRUSHINGS;  Surgeon: [name], DO;  Location: [facility];  Service: Pulmonary;;   BRONCHIAL NEEDLE ASPIRATION BIOPSY  04/15/2022   Procedure: BRONCHIAL NEEDLE ASPIRATION BIOPSIES;  Surgeon: [name], DO;  Location: [facility];  Service: Pulmonary;;   COLONOSCOPY  2022   FIDUCIAL MARKER PLACEMENT  04/15/2022   Procedure: FIDUCIAL MARKER PLACEMENT;  Surgeon: [name], DO;  Location: [facility];  Service: Pulmonary;;   JOINT REPLACEMENT     left knee   Left shoulder surgery     2007   RADICAL NECK DISSECTION Right 10/10/2022   Procedure: CENTRAL NECK DISSECTION;  Surgeon: Scarlette Ar, MD;  Location: [facility] OR;  Service: ENT;  Laterality: Right;   THYROIDECTOMY Right 10/10/2022   Procedure: RIGHT THYROID LOBECTOMY;  Surgeon: Scarlette Ar, MD;  Location[facility];  Service: ENT;  Laterality: Right;   TOTAL KNEE ARTHROPLASTY     2012 left   TOTAL KNEE ARTHROPLASTY Right 10/08/2020   Procedure: TOTAL KNEE ARTHROPLASTY;  Surgeon: Ollen Gross, MD;  Location: [facility];  Service: Orthopedics;  Laterality: Right;   TUBAL LIGATION  1978   VIDEO BRONCHOSCOPY WITH RADIAL ENDOBRONCHIAL ULTRASOUND  04/15/2022    Procedure: VIDEO BRONCHOSCOPY WITH RADIAL ENDOBRONCHIAL ULTRASOUND;  Surgeon: [name], DO;  Location: [facility];  Service: Pulmonary;;    Current Outpatient Medications  Medication Sig Dispense Refill   albuterol (VENTOLIN HFA) 108 (90 Base) MCG/ACT inhaler 2 puffs in lungs every 6 hours as needed for wheezing/short of breath. 8.5 g 5   amLODipine (NORVASC) 5 MG tablet Take 1 tablet (5 mg total) by mouth daily. 90 tablet 3   anastrozole (ARIMIDEX) 1 MG tablet Take 1 mg by mouth in the morning.     Calcium Carb-Cholecalciferol 600-20 MG-MCG TABS Take 1 tablet by mouth in the morning.     chlorpheniramine (CHLOR-TRIMETON) 4 MG tablet Take 4 mg by mouth See admin instructions. Take 1 tablet (4 mg) by mouth scheduled every night & may take an additional dose in the morning if needed for allergies.     cholecalciferol (VITAMIN D3) 25 MCG (1000 UNIT) tablet Take 1,000 Units by mouth in the morning.     cholestyramine (QUESTRAN) 4 g packet Take 1 packet (4 g total) by mouth daily. 30 each 0   cyanocobalamin (VITAMIN B12) 1000 MCG/ML injection Inject 1 mL (1,000 mcg total) into the skin every 30 (thirty) days. 1 mL 11   famotidine (PEPCID) 40 MG tablet Take 1 tablet (40 mg total) by mouth daily. 90 tablet 2   fluticasone furoate-vilanterol (BREO ELLIPTA) 200-25 MCG/ACT AEPB Inhale 1 puff into the lungs daily. (Patient taking differently: Inhale 1 puff into the lungs 4 (four) times a week.) 1 each 11   hydrocortisone 2.5 % cream Apply topically in the morning, at noon, in the evening, and at bedtime. Apply to your anus 4 times a day.  Apothecary hemorrhoid cream with nitroglycerin added 30 g 0   hydrOXYzine (ATARAX/VISTARIL) 25 MG tablet Take 25 mg by mouth every 4 (four) hours as needed for itching.     L-Lysine 500 MG TABS Take 500 mg by mouth every evening.     magnesium oxide (MAG-OX) 400 (240 Mg) MG tablet Take 1 tablet (400 mg total) by mouth daily. 90 tablet 3   Melatonin 10 MG CAPS Take  10 mg by mouth at bedtime.     Polyethyl Glycol-Propyl Glycol (LUBRICANT EYE DROPS) 0.4-0.3 % SOLN Place 1-2 drops into both eyes 3 (three) times daily as needed (dry/irritated eyes.).     potassium chloride (KLOR-CON 10) 10 MEQ tablet Take 1 tablet (10 mEq total) by mouth daily. 180 tablet 3   raloxifene (EVISTA) 60 MG tablet Take 1 tablet (60 mg total) by mouth daily. 90 tablet 2   RESTASIS 0.05 % ophthalmic emulsion Place 1 drop into both eyes 2 (two) times daily as needed (dry eyes).      rosuvastatin (CRESTOR) 20 MG tablet TAKE 1 TABLET DAILY (Patient taking differently: Take 20 mg by mouth daily.) 90 tablet 0   sodium chloride (OCEAN) 0.65 % SOLN nasal spray Place 1 spray into both nostrils as needed for congestion.     traZODone (DESYREL) 100 MG tablet TAKE 1 TABLET AT BEDTIME (Patient taking differently: Take 100 mg by mouth at bedtime.) 90 tablet 3  No current facility-administered medications for this visit.    Allergies as of 12/18/2022 - Review Complete 12/16/2022  Allergen Reaction Noted   Benadryl [diphenhydramine hcl] Nausea Only 06/29/2013   Penicillins  06/29/2013    Family History  Problem Relation Age of Onset   Alzheimer's disease Mother    Diabetes Mother    Stroke Father    Hypertension Father    Cancer Brother     Social History   Socioeconomic History   Marital status: Married    Spouse name: Windy Fast   Number of children: 3   Years of education: 16   Highest education level: Bachelor's degree (e.g., BA, AB, BS)  Occupational History   Occupation: retired  Tobacco Use   Smoking status: Former    Packs/day: 0.25    Years: 40.00    Total pack years: 10.00    Types: Cigarettes    Quit date: 09/17/2014    Years since quitting: 8.2   Smokeless tobacco: Never   Tobacco comments:    smokes 3 a day  Vaping Use   Vaping Use: Former   Start date: 07/18/2018   Quit date: 07/19/2019  Substance and Sexual Activity   Alcohol use: Yes    Alcohol/week: 4.0  standard drinks of alcohol    Types: 4 Standard drinks or equivalent per week   Drug use: No   Sexual activity: Not Currently  Other Topics Concern   Not on file  Social History Narrative   Lives home with her husband. Enjoys travelling, playing bingo, exercising at Recreation center, eats out a lot   Social Determinants of Health   Financial Resource Strain: Low Risk  (11/18/2022)   Overall Financial Resource Strain (CARDIA)    Difficulty of Paying Living Expenses: Not hard at all  Food Insecurity: No Food Insecurity (11/01/2022)   Hunger Vital Sign    Worried About Running Out of Food in the Last Year: Never true    Ran Out of Food in the Last Year: Never true  Transportation Needs: No Transportation Needs (11/18/2022)   PRAPARE - Administrator, Civil Service (Medical): No    Lack of Transportation (Non-Medical): No  Physical Activity: Insufficiently Active (08/02/2021)   Exercise Vital Sign    Days of Exercise per Week: 4 days    Minutes of Exercise per Session: 30 min  Stress: No Stress Concern Present (08/02/2021)   [employer] of Occupational Health - Occupational Stress Questionnaire    Feeling of Stress : Not at all  Social Connections: Socially Integrated (08/02/2021)   Social Connection and Isolation Panel [NHANES]    Frequency of Communication with Friends and Family: More than three times a week    Frequency of Social Gatherings with Friends and Family: More than three times a week    Attends Religious Services: More than 4 times per year    Active Member of [employer] or Organizations: Yes    Attends Engineer, structural: More than 4 times per year    Marital Status: Married    Review of Systems: Gen: Denies fever, chills, anorexia. Denies fatigue, weakness, weight loss.  CV: Denies chest pain, palpitations, syncope, peripheral edema, and claudication. Resp: Denies dyspnea at rest, cough, wheezing, coughing up blood, and pleurisy. GI: Denies  vomiting blood, jaundice, and fecal incontinence.   Denies dysphagia or odynophagia. Derm: Denies rash, itching, dry skin Psych: Denies depression, anxiety, memory loss, confusion. No homicidal or suicidal ideation.  Heme: Denies bruising, bleeding,  and enlarged lymph nodes.  Physical Exam: There were no vitals taken for this visit. General:   Alert and oriented. No distress noted. Pleasant and cooperative.  Head:  Normocephalic and atraumatic. Eyes:  Conjuctiva clear without scleral icterus. Heart:  S1, S2 present without murmurs appreciated. Lungs:  Clear to auscultation bilaterally. No wheezes, rales, or rhonchi. No distress.  Abdomen:  +BS, soft, non-tender and non-distended. No rebound or guarding. No HSM or masses noted. Msk:  Symmetrical without gross deformities. Normal posture. Extremities:  Without edema. Neurologic:  Alert and  oriented x4 Psych:  Normal mood and affect.    Assessment:     Plan:  ***   Ermalinda Memos, PA-C [facility] 12/18/2022

## 2022-12-18 ENCOUNTER — Telehealth: Payer: Self-pay | Admitting: Gastroenterology

## 2022-12-18 ENCOUNTER — Encounter: Payer: Self-pay | Admitting: Gastroenterology

## 2022-12-18 ENCOUNTER — Ambulatory Visit: Payer: Medicare HMO | Admitting: Gastroenterology

## 2022-12-18 VITALS — BP 113/75 | HR 85 | Temp 98.1°F | Ht 61.0 in | Wt 174.0 lb

## 2022-12-18 DIAGNOSIS — K7460 Unspecified cirrhosis of liver: Secondary | ICD-10-CM

## 2022-12-18 NOTE — Telephone Encounter (Signed)
Courtney, I need to add an iron panel to the labs patient is going to have drawn at Dr. Livia Snellen office. I have placed the order. Can you fax to Dr. Livia Snellen office and ensure they are aware this needs to be added to the labs I already ordered?

## 2022-12-18 NOTE — Patient Instructions (Addendum)
Please have blood work completed at Dr. Livia Snellen office.   We will arrange you to have an upper endoscopy in the near future with Dr. Abbey Chatters at [place] will be due for an ultrasound of your liver in June.  We will arrange this for you.  Nutrition recommendations:  High-protein diet from a primarily plant-based diet. Avoid red meat.  No raw or undercooked meat, seafood, or shellfish. Low-fat/cholesterol/carbohydrate diet. Limit sodium to no more than 2000 mg/day including everything that you eat and drink.  Avoid all alcohol.   You can take Tylenol, but do not take more than 2000 mg of Tylenol per 24 hours.  Monitor for swelling in your abdomen or lower extremities, yellowing of the eyes or skin, bruising/bleeding, mental status changes and let me know if this occurs.  We will plan to follow-up with you in 6 months or sooner if needed. Do not hesitate to call if you have questions or concerns.   It was nice meeting you today! I know you have a lot going on. Keep your head up!  Aliene Altes, PA-C [facility]

## 2022-12-19 ENCOUNTER — Encounter: Payer: Self-pay | Admitting: *Deleted

## 2022-12-19 NOTE — Telephone Encounter (Signed)
Noted. Tammy is taking care of it

## 2022-12-19 NOTE — Telephone Encounter (Signed)
Noted  

## 2022-12-22 ENCOUNTER — Other Ambulatory Visit: Payer: Medicare HMO

## 2022-12-22 DIAGNOSIS — D509 Iron deficiency anemia, unspecified: Secondary | ICD-10-CM | POA: Diagnosis not present

## 2022-12-22 DIAGNOSIS — K7460 Unspecified cirrhosis of liver: Secondary | ICD-10-CM | POA: Diagnosis not present

## 2022-12-24 ENCOUNTER — Encounter: Payer: Self-pay | Admitting: *Deleted

## 2022-12-24 ENCOUNTER — Telehealth: Payer: Self-pay | Admitting: Gastroenterology

## 2022-12-24 LAB — IRON AND TIBC
Iron Saturation: 16 % (ref 15–55)
Iron: 49 ug/dL (ref 27–139)
Total Iron Binding Capacity: 311 ug/dL (ref 250–450)
UIBC: 262 ug/dL (ref 118–369)

## 2022-12-24 LAB — SPECIMEN STATUS REPORT

## 2022-12-24 LAB — FERRITIN: Ferritin: 183 ng/mL — ABNORMAL HIGH (ref 15–150)

## 2022-12-24 NOTE — Telephone Encounter (Signed)
Patient left a message about her pre-surgical appt for Endo on the 16th.  She said she received a call and wasn't at home at the time and couldn't answer it.  She would like for [place] to call her back.

## 2022-12-25 ENCOUNTER — Telehealth: Payer: Self-pay | Admitting: *Deleted

## 2022-12-25 ENCOUNTER — Ambulatory Visit: Payer: Medicare HMO | Admitting: Internal Medicine

## 2022-12-25 NOTE — Telephone Encounter (Signed)
See prior message, tammy R already made aware

## 2022-12-25 NOTE — Telephone Encounter (Signed)
Called pt and informed her of  pre-op visit for 12/31/22 at 11 am at [facility]. Pt verbalized understanding.

## 2022-12-26 DIAGNOSIS — R32 Unspecified urinary incontinence: Secondary | ICD-10-CM | POA: Diagnosis not present

## 2022-12-26 DIAGNOSIS — Z8249 Family history of ischemic heart disease and other diseases of the circulatory system: Secondary | ICD-10-CM | POA: Diagnosis not present

## 2022-12-26 DIAGNOSIS — J449 Chronic obstructive pulmonary disease, unspecified: Secondary | ICD-10-CM | POA: Diagnosis not present

## 2022-12-26 DIAGNOSIS — E785 Hyperlipidemia, unspecified: Secondary | ICD-10-CM | POA: Diagnosis not present

## 2022-12-26 DIAGNOSIS — K219 Gastro-esophageal reflux disease without esophagitis: Secondary | ICD-10-CM | POA: Diagnosis not present

## 2022-12-26 DIAGNOSIS — Z87891 Personal history of nicotine dependence: Secondary | ICD-10-CM | POA: Diagnosis not present

## 2022-12-26 DIAGNOSIS — E669 Obesity, unspecified: Secondary | ICD-10-CM | POA: Diagnosis not present

## 2022-12-26 DIAGNOSIS — Z809 Family history of malignant neoplasm, unspecified: Secondary | ICD-10-CM | POA: Diagnosis not present

## 2022-12-26 DIAGNOSIS — Z823 Family history of stroke: Secondary | ICD-10-CM | POA: Diagnosis not present

## 2022-12-26 DIAGNOSIS — E876 Hypokalemia: Secondary | ICD-10-CM | POA: Diagnosis not present

## 2022-12-26 DIAGNOSIS — M810 Age-related osteoporosis without current pathological fracture: Secondary | ICD-10-CM | POA: Diagnosis not present

## 2022-12-26 DIAGNOSIS — I129 Hypertensive chronic kidney disease with stage 1 through stage 4 chronic kidney disease, or unspecified chronic kidney disease: Secondary | ICD-10-CM | POA: Diagnosis not present

## 2022-12-30 LAB — CBC WITH DIFFERENTIAL/PLATELET
Basophils Absolute: 0 10*3/uL (ref 0.0–0.2)
Basos: 1 %
EOS (ABSOLUTE): 0.2 10*3/uL (ref 0.0–0.4)
Eos: 4 %
Hematocrit: 32.1 % — ABNORMAL LOW (ref 34.0–46.6)
Hemoglobin: 10.1 g/dL — ABNORMAL LOW (ref 11.1–15.9)
Immature Grans (Abs): 0 10*3/uL (ref 0.0–0.1)
Immature Granulocytes: 0 %
Lymphocytes Absolute: 1.3 10*3/uL (ref 0.7–3.1)
Lymphs: 25 %
MCH: 26.9 pg (ref 26.6–33.0)
MCHC: 31.5 g/dL (ref 31.5–35.7)
MCV: 86 fL (ref 79–97)
Monocytes Absolute: 0.3 10*3/uL (ref 0.1–0.9)
Monocytes: 6 %
Neutrophils Absolute: 3.3 10*3/uL (ref 1.4–7.0)
Neutrophils: 64 %
Platelets: 289 10*3/uL (ref 150–450)
RBC: 3.75 x10E6/uL — ABNORMAL LOW (ref 3.77–5.28)
RDW: 15.3 % (ref 11.7–15.4)
WBC: 5.1 10*3/uL (ref 3.4–10.8)

## 2022-12-30 LAB — PROTIME-INR
INR: 1 (ref 0.9–1.2)
Prothrombin Time: 10.5 s (ref 9.1–12.0)

## 2022-12-30 LAB — HEPATIC FUNCTION PANEL
ALT: 8 [IU]/L (ref 0–32)
AST: 11 [IU]/L (ref 0–40)
Albumin: 4.6 g/dL (ref 3.8–4.8)
Alkaline Phosphatase: 67 [IU]/L (ref 44–121)
Bilirubin Total: 0.4 mg/dL (ref 0.0–1.2)
Bilirubin, Direct: 0.14 mg/dL (ref 0.00–0.40)
Total Protein: 6.7 g/dL (ref 6.0–8.5)

## 2022-12-30 LAB — CERULOPLASMIN: Ceruloplasmin: 26.2 mg/dL (ref 19.0–39.0)

## 2022-12-30 LAB — ANA: Anti Nuclear Antibody (ANA): NEGATIVE

## 2022-12-30 LAB — HEPATITIS B SURFACE ANTIBODY,QUALITATIVE: Hep B Surface Ab, Qual: NONREACTIVE

## 2022-12-30 LAB — IGG, IGA, IGM
IgA/Immunoglobulin A, Serum: 51 mg/dL — ABNORMAL LOW (ref 64–422)
IgG (Immunoglobin G), Serum: 933 mg/dL (ref 586–1602)
IgM (Immunoglobulin M), Srm: 147 mg/dL (ref 26–217)

## 2022-12-30 LAB — HEPATITIS B CORE ANTIBODY, TOTAL: Hep B Core Total Ab: NEGATIVE

## 2022-12-30 LAB — ALPHA-1-ANTITRYPSIN DEFICIENCY

## 2022-12-30 LAB — HEPATITIS C ANTIBODY: Hep C Virus Ab: NONREACTIVE

## 2022-12-30 LAB — HEPATITIS A ANTIBODY, TOTAL: hep A Total Ab: NEGATIVE

## 2022-12-30 LAB — HEPATITIS B SURFACE ANTIGEN: Hepatitis B Surface Ag: NEGATIVE

## 2022-12-30 LAB — MITOCHONDRIAL ANTIBODIES: Mitochondrial Ab: <20 Units (ref 0.0–20.0)

## 2022-12-30 LAB — ANTI-SMOOTH MUSCLE ANTIBODY, IGG: Smooth Muscle Ab: 5 Units (ref 0–19)

## 2022-12-30 LAB — AFP TUMOR MARKER: AFP, Serum, Tumor Marker: 2.9 ng/mL (ref 0.0–9.2)

## 2022-12-31 ENCOUNTER — Encounter (HOSPITAL_COMMUNITY)
Admission: RE | Admit: 2022-12-31 | Discharge: 2022-12-31 | Disposition: A | Discharge status: Home / Self Care | Payer: Medicare HMO | Source: Ambulatory Visit | Attending: Internal Medicine | Admitting: Internal Medicine

## 2022-12-31 NOTE — Patient Instructions (Addendum)
20    Your procedure is scheduled on: 01/02/2023.    Report to [place] Entrance at   0830   AM.    Call this number if you have problems the morning of surgery: [phone]   Remember:   Follow instructions on letter from office regarding when to stop eating and drinking         No Smoking the day of procedure      Take these medicines the morning of surgery with A SIP OF WATER:  amlodipine, pepcid.   Do not wear jewelry, make-up or nail polish.  Do not wear lotions, powders, or perfumes. You may wear deodorant.                Do not bring valuables to the hospital.  Contacts, dentures or bridgework may not be worn into surgery.  Leave suitcase in the car. After surgery it may be brought to your room.  For patients admitted to the hospital, checkout time is 11:00 AM the day of discharge.   Patients discharged the day of surgery will not be allowed to drive home. Upper Endoscopy, Adult Upper endoscopy is a procedure to look inside the upper GI (gastrointestinal) tract. The upper GI tract is made up of: The part of the body that moves food from your mouth to your stomach (esophagus). The stomach. The first part of your small intestine (duodenum). This procedure is also called esophagogastroduodenoscopy (EGD) or gastroscopy. In this procedure, your health care provider passes a thin, flexible tube (endoscope) through your mouth and down your esophagus into your stomach. A small camera is attached to the end of the tube. Images from the camera appear on a monitor in the exam room. During this procedure, your health care provider may also remove a small piece of tissue to be sent to a lab and examined under a microscope (biopsy). Your health care provider may do an upper endoscopy to diagnose cancers of the upper GI tract. You may also have this procedure to find the cause of other conditions, such as: Stomach pain. Heartburn. Pain or problems when swallowing. Nausea and  vomiting. Stomach bleeding. Stomach ulcers. Tell a health care provider about: Any allergies you have. All medicines you are taking, including vitamins, herbs, eye drops, creams, and over-the-counter medicines. Any problems you or family members have had with anesthetic medicines. Any blood disorders you have. Any surgeries you have had. Any medical conditions you have. Whether you are pregnant or may be pregnant. What are the risks? Generally, this is a safe procedure. However, problems may occur, including: Infection. Bleeding. Allergic reactions to medicines. A tear or hole (perforation) in the esophagus, stomach, or duodenum. What happens before the procedure? Staying hydrated Follow instructions from your health care provider about hydration, which may include: Up to 4 hours before the procedure - you may continue to drink clear liquids, such as water, clear fruit juice, black coffee, and plain tea.   Medicines Ask your health care provider about: Changing or stopping your regular medicines. This is especially important if you are taking diabetes medicines or blood thinners. Taking medicines such as aspirin and ibuprofen. These medicines can thin your blood. Do not take these medicines unless your health care provider tells you to take them. Taking over-the-counter medicines, vitamins, herbs, and supplements. General instructions Plan to have someone take you home from the hospital or clinic. If you will be going home right after the procedure, plan to have someone with  you for 24 hours. Ask your health care provider what steps will be taken to help prevent infection. What happens during the procedure?  An IV will be inserted into one of your veins. You may be given one or more of the following: A medicine to help you relax (sedative). A medicine to numb the throat (local anesthetic). You will lie on your left side on an exam table. Your health care provider will pass the  endoscope through your mouth and down your esophagus. Your health care provider will use the scope to check the inside of your esophagus, stomach, and duodenum. Biopsies may be taken. The endoscope will be removed. The procedure may vary among health care providers and hospitals. What happens after the procedure? Your blood pressure, heart rate, breathing rate, and blood oxygen level will be monitored until you leave the hospital or clinic. Do not drive for 24 hours if you were given a sedative during your procedure. When your throat is no longer numb, you may be given some fluids to drink. It is up to you to get the results of your procedure. Ask your health care provider, or the department that is doing the procedure, when your results will be ready. Summary Upper endoscopy is a procedure to look inside the upper GI tract. During the procedure, an IV will be inserted into one of your veins. You may be given a medicine to help you relax. A medicine will be used to numb your throat. The endoscope will be passed through your mouth and down your esophagus. This information is not intended to replace advice given to you by your health care provider. Make sure you discuss any questions you have with your health care provider. Document Revised: 04/28/2018 Document Reviewed: 04/05/2018 Elsevier Patient Education  [place] [facility]  Please read the instructions outlined below and refer to this sheet in the next few weeks. These discharge instructions provide you with general information on caring for yourself after you leave the hospital. Your doctor may also give you specific instructions. While your treatment has been planned according to the most current medical practices available, unavoidable complications occasionally occur. If you have any  problems or questions after discharge, please call your doctor. HOME CARE INSTRUCTIONS Activity You may resume your regular activity but move at a slower pace for the next 24 hours.  Take frequent rest periods for the next 24 hours.  Walking will help expel (get rid of) the air and reduce the bloated feeling in your abdomen.  No driving for 24 hours (because of the anesthesia (medicine) used during the test).  You may shower.  Do not sign any important legal documents or operate any machinery for 24  hours (because of the anesthesia used during the test).  Nutrition Drink plenty of fluids.  You may resume your normal diet.  Begin with a light meal and progress to your normal diet.  Avoid alcoholic beverages for 24 hours or as instructed by your caregiver.  Medications You may resume your normal medications unless your caregiver tells you otherwise. What you can expect today You may experience abdominal discomfort such as a feeling of fullness or "gas" pains.  You may experience a sore throat for 2 to 3 days. This is normal. Gargling with salt water may help this.  Follow-up Your doctor will discuss the results of your test with you. SEEK IMMEDIATE MEDICAL CARE IF: You have excessive nausea (feeling sick to your stomach) and/or vomiting.  You have severe abdominal pain and distention (swelling).  You have trouble swallowing.  You have a temperature over 100 F (37.8 C).  You have rectal bleeding or vomiting of blood.  Document Released: 06/17/2004 Document Revised: 10/23/2011 Document Reviewed: 12/29/2007 Esophageal Varices  Esophageal varices are enlarged veins in the part of the body that moves food from the mouth to the stomach (esophagus). They develop when extra blood is forced to flow through these veins because the blood's normal flow is blocked. Without treatment, esophageal varices eventually break and bleed (hemorrhage), which can be life-threatening. What are the  causes? This condition may be caused by: Scarring of the liver due to alcoholism. This is the most common cause. Long-term liver disease. Severe heart failure. A blood clot in a vein that supplies the liver. A disease that causes inflammation in the organs and other body areas. What are the signs or symptoms? Esophageal varices usually do not cause symptoms unless they start to bleed. Symptoms of bleeding esophageal varices include: Vomiting material that is bright red or that is black and looks like coffee grounds. Coughing up blood. Stools (feces) that look black and tarry. Dizziness or light-headedness. Low blood pressure. Loss of consciousness. How is this diagnosed? This condition is diagnosed with a procedure called endoscopy. During endoscopy, your health care provider uses a flexible tube with a small camera on the end of it (endoscope) to look down your throat and examine your esophagus. You may also have other tests, including: Imaging tests, such as a CT scan or ultrasound. Blood tests. How is this treated? This condition may be treated with: Medicines. Medicines are usually used to treat varices that are not bleeding. Procedures. Procedures are done to treat varices that are bleeding. They stop bleeding, or reduce pressure and the risk of bleeding. Procedures include: Placing an elastic band around the varices to keep them from bleeding. Replacing blood that you have lost due to bleeding. This may include getting a transfusion of blood or parts of blood, such as platelets or clotting factors. You may be given antibiotic medicine to help prevent infection. Getting an injection into the varices that causes it to shrink and close (sclerotherapy). You may also be given medicines that tighten blood vessels or change blood flow. Placing a balloon in the esophagus and inflating it. The balloon applies pressure to the bleeding veins to help stop the bleeding. Placing a small tube  within the veins in the liver. This decreases blood flow and pressure in the esophageal varices. If other treatments do not work, you may need a liver transplant. Follow these instructions at home: Medicines Take over-the-counter and prescription medicines only as told by your health care provider. If you were prescribed  an antibiotic medicine, take it as told by your health care provider. Do not stop taking the antibiotic even if you start to feel better. Do not take any NSAIDs (such as aspirin or ibuprofen) before first getting approval from your health care provider. General instructions  Do not drink alcohol. Return to your normal activities as told by your health care provider. Ask your health care provider what activities are safe for you. Avoid vigorous physical activity. Ask your health care provider what exercises are safe for you. Keep all follow-up visits. Contact a health care provider if: You have pain in the abdomen. You are unable to eat or drink. Get help right away if: You vomit blood or have blood in your stool. You have stools that look black or tarry. You have chest pain. You feel dizzy or have low blood pressure. You lose consciousness. These symptoms may represent a serious problem that is an emergency. Do not wait to see if the symptoms will go away. Get medical help right away. Call your local emergency services (911 in the [place] Do not drive yourself to the hospital. Summary Esophageal varices are enlarged veins in the esophagus, the part of your body that moves food from your mouth to your stomach. Without treatment, esophageal varices eventually break and bleed, which can be life-threatening. Esophageal varices usually do not cause symptoms unless they start to bleed. Keep all follow-up visits. This is important. This information is not intended to replace advice given to you by your health care provider. Make sure you discuss any questions you have with your  health care provider. Document Revised: 02/21/2020 Document Reviewed: 02/21/2020 Elsevier Patient Education  [place].

## 2023-01-02 ENCOUNTER — Encounter (HOSPITAL_COMMUNITY)
Admission: RE | Disposition: A | Discharge status: Home / Self Care | Payer: Self-pay | Source: Home / Self Care | Attending: Internal Medicine

## 2023-01-02 ENCOUNTER — Encounter (HOSPITAL_COMMUNITY): Payer: Self-pay

## 2023-01-02 ENCOUNTER — Ambulatory Visit (HOSPITAL_COMMUNITY): Payer: Medicare HMO | Admitting: Certified Registered"

## 2023-01-02 ENCOUNTER — Ambulatory Visit (HOSPITAL_BASED_OUTPATIENT_CLINIC_OR_DEPARTMENT_OTHER): Payer: Medicare HMO | Admitting: Certified Registered"

## 2023-01-02 ENCOUNTER — Ambulatory Visit (HOSPITAL_COMMUNITY)
Admission: RE | Admit: 2023-01-02 | Discharge: 2023-01-02 | Disposition: A | Discharge status: Home / Self Care | Payer: Medicare HMO | Attending: Internal Medicine | Admitting: Internal Medicine

## 2023-01-02 DIAGNOSIS — M1990 Unspecified osteoarthritis, unspecified site: Secondary | ICD-10-CM | POA: Insufficient documentation

## 2023-01-02 DIAGNOSIS — Z853 Personal history of malignant neoplasm of breast: Secondary | ICD-10-CM | POA: Insufficient documentation

## 2023-01-02 DIAGNOSIS — J449 Chronic obstructive pulmonary disease, unspecified: Secondary | ICD-10-CM | POA: Diagnosis not present

## 2023-01-02 DIAGNOSIS — K7460 Unspecified cirrhosis of liver: Secondary | ICD-10-CM | POA: Diagnosis not present

## 2023-01-02 DIAGNOSIS — K219 Gastro-esophageal reflux disease without esophagitis: Secondary | ICD-10-CM | POA: Diagnosis not present

## 2023-01-02 DIAGNOSIS — Z87891 Personal history of nicotine dependence: Secondary | ICD-10-CM | POA: Insufficient documentation

## 2023-01-02 DIAGNOSIS — E785 Hyperlipidemia, unspecified: Secondary | ICD-10-CM | POA: Insufficient documentation

## 2023-01-02 DIAGNOSIS — F419 Anxiety disorder, unspecified: Secondary | ICD-10-CM | POA: Diagnosis not present

## 2023-01-02 DIAGNOSIS — R197 Diarrhea, unspecified: Secondary | ICD-10-CM | POA: Diagnosis not present

## 2023-01-02 DIAGNOSIS — K2970 Gastritis, unspecified, without bleeding: Secondary | ICD-10-CM

## 2023-01-02 DIAGNOSIS — Z85850 Personal history of malignant neoplasm of thyroid: Secondary | ICD-10-CM | POA: Insufficient documentation

## 2023-01-02 DIAGNOSIS — I10 Essential (primary) hypertension: Secondary | ICD-10-CM

## 2023-01-02 DIAGNOSIS — I129 Hypertensive chronic kidney disease with stage 1 through stage 4 chronic kidney disease, or unspecified chronic kidney disease: Secondary | ICD-10-CM | POA: Diagnosis not present

## 2023-01-02 DIAGNOSIS — N189 Chronic kidney disease, unspecified: Secondary | ICD-10-CM | POA: Diagnosis not present

## 2023-01-02 DIAGNOSIS — Z85118 Personal history of other malignant neoplasm of bronchus and lung: Secondary | ICD-10-CM | POA: Diagnosis not present

## 2023-01-02 DIAGNOSIS — R69 Illness, unspecified: Secondary | ICD-10-CM | POA: Diagnosis not present

## 2023-01-02 HISTORY — PX: ESOPHAGOGASTRODUODENOSCOPY (EGD) WITH PROPOFOL: SHX5813

## 2023-01-02 SURGERY — ESOPHAGOGASTRODUODENOSCOPY (EGD) WITH PROPOFOL
Anesthesia: General

## 2023-01-02 MED ORDER — PROPOFOL 10 MG/ML IV BOLUS
INTRAVENOUS | Status: DC | PRN
  Administered 2023-01-02: 100 mg via INTRAVENOUS
  Administered 2023-01-02: 20 mg via INTRAVENOUS

## 2023-01-02 MED ORDER — LACTATED RINGERS IV SOLN: INTRAVENOUS | Status: DC | PRN

## 2023-01-02 MED ORDER — LIDOCAINE HCL (CARDIAC) PF 100 MG/5ML IV SOSY
PREFILLED_SYRINGE | INTRAVENOUS | Status: DC | PRN
  Administered 2023-01-02: 50 mg via INTRATRACHEAL

## 2023-01-02 MED ORDER — STERILE WATER FOR IRRIGATION IR SOLN
Status: DC | PRN
  Administered 2023-01-02: 60 mL

## 2023-01-02 NOTE — Anesthesia Preprocedure Evaluation (Addendum)
Anesthesia Evaluation  Patient identified by MRN, date of birth, ID band Patient awake    Reviewed: Allergy & Precautions, NPO status , Patient's Chart, lab work & pertinent test results  Airway Mallampati: II  TM Distance: >3 FB Neck ROM: Full    Dental no notable dental hx. (+) Teeth Intact, Dental Advisory Given   Pulmonary COPD,  COPD inhaler, former smoker   breath sounds clear to auscultation       Cardiovascular hypertension,  Rhythm:Regular Rate:Normal     Neuro/Psych  PSYCHIATRIC DISORDERS Anxiety        GI/Hepatic ,GERD  Controlled,,  Endo/Other    Renal/GU      Musculoskeletal  (+) Arthritis ,    Abdominal Normal abdominal exam  (+)   Peds  Hematology   Anesthesia Other Findings   Reproductive/Obstetrics                             Anesthesia Physical Anesthesia Plan  ASA: 3  Anesthesia Plan: General   Post-op Pain Management:    Induction: Intravenous  PONV Risk Score and Plan: 1 and Propofol infusion  Airway Management Planned: Natural Airway, Nasal Cannula and Simple Face Mask  Additional Equipment:   Intra-op Plan:   Post-operative Plan:   Informed Consent: I have reviewed the patients History and Physical, chart, labs and discussed the procedure including the risks, benefits and alternatives for the proposed anesthesia with the patient or authorized representative who has indicated his/her understanding and acceptance.       Plan Discussed with: Anesthesiologist and Surgeon  Anesthesia Plan Comments:        Anesthesia Quick Evaluation

## 2023-01-02 NOTE — Op Note (Signed)
[facility] Patient Name: Renee Maldonado Procedure Date: 01/02/2023 12:07 PM MRN: [id number] Date of Birth: 1945-12-14 Attending MD: Elon Alas. Abbey Chatters , [place], [id number] CSN: [id number] Age: 77 Admit Type: Outpatient Procedure:                Upper GI endoscopy Indications:              Cirrhosis rule out esophageal varices Providers:                Elon Alas. Abbey Chatters, DO, Caprice Kluver, [place]                            Risa Grill, Technician Referring MD:              Medicines:                See the Anesthesia note for documentation of the                            administered medications Complications:            No immediate complications. Estimated Blood Loss:     Estimated blood loss: none. Procedure:                Pre-Anesthesia Assessment:                           - The anesthesia plan was to use monitored                            anesthesia care (MAC).                           After obtaining informed consent, the endoscope was                            passed under direct vision. Throughout the                            procedure, the patient's blood pressure, pulse, and                            oxygen saturations were monitored continuously. The                            [id number]-H190 ([id number]) scope was introduced through the                            mouth, and advanced to the second part of duodenum.                            The upper GI endoscopy was accomplished without                            difficulty. The patient tolerated the procedure                            well. Scope  In: 12:17:26 PM Scope Out: 12:20:47 PM Total Procedure Duration: 0 hours 3 minutes 21 seconds  Findings:      The Z-line was regular and was found 39 cm from the incisors.      There is no endoscopic evidence of varices in the entire esophagus.      Localized mild inflammation characterized by erythema was found in the       gastric antrum.      The duodenal bulb, first  portion of the duodenum and second portion of       the duodenum were normal. Impression:               - Z-line regular, 39 cm from the incisors.                           - Gastritis.                           - Normal duodenal bulb, first portion of the                            duodenum and second portion of the duodenum.                           - No specimens collected. Moderate Sedation:      Per Anesthesia Care Recommendation:           - Patient has a contact number available for                            emergencies. The signs and symptoms of potential                            delayed complications were discussed with the                            patient. Return to normal activities tomorrow.                            Written discharge instructions were provided to the                            patient.                           - Resume previous diet.                           - Continue present medications.                           - Repeat upper endoscopy in 3 years for screening                            purposes.                           - Return to GI clinic in 6 months. Procedure  Code(s):        --- Professional ---                           [phone], Esophagogastroduodenoscopy, flexible,                            transoral; diagnostic, including collection of                            specimen(s) by brushing or washing, when performed                            (separate procedure) Diagnosis Code(s):        --- Professional ---                           K29.70, Gastritis, unspecified, without bleeding                           K74.60, Unspecified cirrhosis of liver CPT copyright 2022 American Medical Association. All rights reserved. The codes documented in this report are preliminary and upon coder review may  be revised to meet current compliance requirements. Elon Alas. Abbey Chatters, DO [place] Abbey Chatters, DO 01/02/2023 12:24:24 PM This report has been signed  electronically. Number of Addenda: 0

## 2023-01-02 NOTE — Transfer of Care (Signed)
Immediate Anesthesia Transfer of Care Note  [name]  Procedure(s) Performed: ESOPHAGOGASTRODUODENOSCOPY (EGD) WITH PROPOFOL  Patient Location: Short Stay  Anesthesia Type:General  Level of Consciousness: awake, alert , oriented, and patient cooperative  Airway & Oxygen Therapy: Patient Spontanous Breathing  Post-op Assessment: Report given to RN, Post -op Vital signs reviewed and stable, and Patient moving all extremities  Post vital signs: Reviewed and stable  Last Vitals:  Vitals Value Taken Time  BP    Temp    Pulse    Resp    SpO2      Last Pain:  Vitals:   01/02/23 1213  PainSc: 0-No pain         Complications: No notable events documented.

## 2023-01-02 NOTE — Discharge Instructions (Addendum)
EGD Discharge instructions Please read the instructions outlined below and refer to this sheet in the next few weeks. These discharge instructions provide you with general information on caring for yourself after you leave the hospital. Your doctor may also give you specific instructions. While your treatment has been planned according to the most current medical practices available, unavoidable complications occasionally occur. If you have any problems or questions after discharge, please call your doctor. ACTIVITY You may resume your regular activity but move at a slower pace for the next 24 hours.  Take frequent rest periods for the next 24 hours.  Walking will help expel (get rid of) the air and reduce the bloated feeling in your abdomen.  No driving for 24 hours (because of the anesthesia (medicine) used during the test).  You may shower.  Do not sign any important legal documents or operate any machinery for 24 hours (because of the anesthesia used during the test).  NUTRITION Drink plenty of fluids.  You may resume your normal diet.  Begin with a light meal and progress to your normal diet.  Avoid alcoholic beverages for 24 hours or as instructed by your caregiver.  MEDICATIONS You may resume your normal medications unless your caregiver tells you otherwise.  WHAT YOU CAN EXPECT TODAY You may experience abdominal discomfort such as a feeling of fullness or "gas" pains.  FOLLOW-UP Your doctor will discuss the results of your test with you.  SEEK IMMEDIATE MEDICAL ATTENTION IF ANY OF THE FOLLOWING OCCUR: Excessive nausea (feeling sick to your stomach) and/or vomiting.  Severe abdominal pain and distention (swelling).  Trouble swallowing.  Temperature over 101 F (37.8 C).  Rectal bleeding or vomiting of blood.    I did not see any evidence of esophageal varices on your exam today.  Mild amount inflammation in your stomach.  Small bowel appeared normal.  Continue current  medications.  Follow-up in 6 months.  I hope you have a great rest of your week!  Elon Alas. Abbey Chatters, D.O. Gastroenterology and Hepatology [facility]

## 2023-01-02 NOTE — Interval H&P Note (Signed)
History and Physical Interval Note:  01/02/2023 11:39 AM  Renee Maldonado  has presented today for surgery, with the diagnosis of cirrhosis.  The various methods of treatment have been discussed with the patient and family. After consideration of risks, benefits and other options for treatment, the patient has consented to  Procedure(s) with comments: ESOPHAGOGASTRODUODENOSCOPY (EGD) WITH PROPOFOL (N/A) - 12:45 pm, pt can't come earlier as a surgical intervention.  The patient's history has been reviewed, patient examined, no change in status, stable for surgery.  I have reviewed the patient's chart and labs.  Questions were answered to the patient's satisfaction.     Eloise Harman

## 2023-01-03 NOTE — Anesthesia Postprocedure Evaluation (Signed)
Anesthesia Post Note  [name]  Procedure(s) Performed: ESOPHAGOGASTRODUODENOSCOPY (EGD) WITH PROPOFOL  Patient location during evaluation: Phase II Anesthesia Type: General Level of consciousness: awake Pain management: pain level controlled Vital Signs Assessment: post-procedure vital signs reviewed and stable Respiratory status: spontaneous breathing and respiratory function stable Cardiovascular status: blood pressure returned to baseline and stable Postop Assessment: no headache and no apparent nausea or vomiting Anesthetic complications: no Comments: Late entry   No notable events documented.   Last Vitals:  Vitals:   01/02/23 1132 01/02/23 1225  BP: 137/81 (!) 104/53  Pulse:  82  Resp:  16  Temp:  (!) 36.4 C  SpO2:  97%    Last Pain:  Vitals:   01/02/23 1226  PainSc: 0-No pain                 Louann Sjogren

## 2023-01-05 ENCOUNTER — Ambulatory Visit: Payer: Medicare HMO

## 2023-01-06 ENCOUNTER — Ambulatory Visit (INDEPENDENT_AMBULATORY_CARE_PROVIDER_SITE_OTHER): Payer: Medicare HMO | Admitting: *Deleted

## 2023-01-06 DIAGNOSIS — E538 Deficiency of other specified B group vitamins: Secondary | ICD-10-CM | POA: Diagnosis not present

## 2023-01-06 MED ORDER — CYANOCOBALAMIN 1000 MCG/ML IJ SOLN
1000.0000 ug | INTRAMUSCULAR | Status: AC
  Administered 2023-01-06 – 2023-12-07 (×11): 1000 ug via INTRAMUSCULAR

## 2023-01-06 NOTE — Progress Notes (Signed)
Vitamin b12 injection given and patient tolerated well.  

## 2023-01-08 ENCOUNTER — Encounter (HOSPITAL_COMMUNITY): Payer: Self-pay | Admitting: Internal Medicine

## 2023-01-09 ENCOUNTER — Ambulatory Visit (INDEPENDENT_AMBULATORY_CARE_PROVIDER_SITE_OTHER): Payer: Medicare HMO | Admitting: *Deleted

## 2023-01-09 DIAGNOSIS — Z23 Encounter for immunization: Secondary | ICD-10-CM

## 2023-01-09 NOTE — Progress Notes (Signed)
Twinrix given and patient tolerated well.

## 2023-01-14 ENCOUNTER — Ambulatory Visit: Payer: Medicare HMO | Admitting: Family Medicine

## 2023-01-15 ENCOUNTER — Encounter: Payer: Self-pay | Admitting: Family Medicine

## 2023-01-15 ENCOUNTER — Ambulatory Visit (INDEPENDENT_AMBULATORY_CARE_PROVIDER_SITE_OTHER): Payer: Medicare HMO | Admitting: Family Medicine

## 2023-01-15 ENCOUNTER — Telehealth: Payer: Self-pay | Admitting: Family Medicine

## 2023-01-15 VITALS — BP 144/80 | HR 77 | Temp 97.7°F | Ht 61.0 in | Wt 173.0 lb

## 2023-01-15 DIAGNOSIS — K7460 Unspecified cirrhosis of liver: Secondary | ICD-10-CM | POA: Diagnosis not present

## 2023-01-15 DIAGNOSIS — I10 Essential (primary) hypertension: Secondary | ICD-10-CM | POA: Diagnosis not present

## 2023-01-15 DIAGNOSIS — E782 Mixed hyperlipidemia: Secondary | ICD-10-CM

## 2023-01-15 DIAGNOSIS — E785 Hyperlipidemia, unspecified: Secondary | ICD-10-CM

## 2023-01-15 MED ORDER — ROSUVASTATIN CALCIUM 20 MG PO TABS: 20.0000 mg | ORAL_TABLET | Freq: Every day | ORAL | 3 refills | Status: DC

## 2023-01-15 NOTE — Progress Notes (Signed)
Subjective:  Patient ID: Renee Maldonado, female    DOB: July 01, 1946  Age: 77 y.o. MRN: [id number]  CC: Medical Management of Chronic Issues   HPI Renee Maldonado presents for extremely worried about cirrhosis. Concerned for varicosities. Tearful. Says she has always enjoyed a high fat diet with a lot of European cheeses. She doesn't drink much so she has ben dxed with NALD. Cirrhosis was noted on CT of 11/01/22. She subsequently underwent GD that was free of varicies. Nml hepatic function noted on 2/5 blood test. AAT DNA analysis was negative as well.      01/15/2023   12:58 PM 12/09/2022    9:30 AM 12/09/2022    9:24 AM  Depression screen PHQ 2/9  Decreased Interest 0 1 0  Down, Depressed, Hopeless 0 0 0  PHQ - 2 Score 0 1 0  Altered sleeping  1   Tired, decreased energy  1   Change in appetite  1   Feeling bad or failure about yourself   0   Trouble concentrating  0   Moving slowly or fidgety/restless  0   Suicidal thoughts  0   PHQ-9 Score  4   Difficult doing work/chores  Not difficult at all     History Renee Maldonado has a past medical history of Anxiety, Arthritis, Cancer ([place]), COPD (chronic obstructive pulmonary disease) ([place]), GERD (gastroesophageal reflux disease), Humerus fracture (2007), Hyperlipidemia, Hypertension, Insomnia, Lung cancer ([place]), OA (osteoarthritis) of knee (10/08/2020), and Thyroid cancer ([place]) (2023).   She has a past surgical history that includes Total knee arthroplasty; Left shoulder surgery; Joint replacement; Tubal ligation (1978); Breast surgery (Right, 01/2019); Breast lumpectomy (Right, 02/2019); Total knee arthroplasty (Right, 10/08/2020); Bronchial biopsy (04/15/2022); Bronchial needle aspiration biopsy (04/15/2022); Bronchial brushings (04/15/2022); Video bronchoscopy with radial endobronchial ultrasound (04/15/2022); Fiducial marker placement (04/15/2022); Colonoscopy (2022); Thyroidectomy (Right, 10/10/2022); Radical neck dissection (Right, 10/10/2022);  and Esophagogastroduodenoscopy (egd) with propofol (N/A, 01/02/2023).   Her family history includes Alzheimer's disease in her mother; Cancer in her brother; Diabetes in her mother; Hypertension in her father; Stroke in her father.She reports that she quit smoking about 7 months ago. Her smoking use included cigarettes. She has a 10.00 pack-year smoking history. She has never used smokeless tobacco. She reports current alcohol use of about 4.0 standard drinks of alcohol per week. She reports that she does not use drugs.    ROS Review of Systems  Constitutional: Negative.   HENT: Negative.    Eyes:  Negative for visual disturbance.  Respiratory:  Negative for shortness of breath.   Cardiovascular:  Negative for chest pain.  Gastrointestinal:  Negative for abdominal pain.  Musculoskeletal:  Negative for arthralgias.    Objective:  BP (!) 144/80   Pulse 77   Temp 97.7 F (36.5 C)   Ht '5\' 1"'$  (1.549 m)   Wt 173 lb (78.5 kg)   SpO2 95%   BMI 32.69 kg/m   BP Readings from Last 3 Encounters:  01/15/23 (!) 144/80  01/02/23 (!) 104/53  12/18/22 113/75    Wt Readings from Last 3 Encounters:  01/15/23 173 lb (78.5 kg)  12/18/22 174 lb (78.9 kg)  12/16/22 177 lb (80.3 kg)     Physical Exam Constitutional:      General: She is not in acute distress.    Appearance: She is well-developed.  HENT:     Head: Normocephalic and atraumatic.  Eyes:     Conjunctiva/sclera: Conjunctivae normal.     Pupils: Pupils are equal,  round, and reactive to light.  Neck:     Thyroid: No thyromegaly.  Cardiovascular:     Rate and Rhythm: Normal rate and regular rhythm.     Heart sounds: Normal heart sounds. No murmur heard. Pulmonary:     Effort: Pulmonary effort is normal. No respiratory distress.     Breath sounds: Normal breath sounds. No wheezing or rales.  Abdominal:     General: Bowel sounds are normal. There is no distension.     Palpations: Abdomen is soft.     Tenderness: There is no  abdominal tenderness.  Musculoskeletal:        General: Normal range of motion.     Cervical back: Normal range of motion and neck supple.  Lymphadenopathy:     Cervical: No cervical adenopathy.  Skin:    General: Skin is warm and dry.  Neurological:     Mental Status: She is alert and oriented to person, place, and time.  Psychiatric:        Behavior: Behavior normal.        Thought Content: Thought content normal.        Judgment: Judgment normal.       Assessment & Plan:   Renee Maldonado was seen today for medical management of chronic issues.  Diagnoses and all orders for this visit:  Mixed hyperlipidemia -     Lipid panel  Primary hypertension -     CBC with Differential/Platelet -     CMP14+EGFR -     Magnesium  Hyperlipidemia, unspecified hyperlipidemia type -     rosuvastatin (CRESTOR) 20 MG tablet; Take 1 tablet (20 mg total) by mouth daily.  Cirrhosis of liver without ascites, unspecified hepatic cirrhosis type ([place])       I have discontinued Renee Maldonado's hydrochlorothiazide. I have also changed her rosuvastatin. Additionally, I am having her maintain her Restasis, anastrozole, Chlor-Trimeton, albuterol, hydrOXYzine, Calcium Carb-Cholecalciferol, cholecalciferol, traZODone, amLODipine, famotidine, cyanocobalamin, raloxifene, fluticasone furoate-vilanterol, Melatonin, L-Lysine, sodium chloride, Lubricant Eye Drops, hydrocortisone, magnesium oxide, potassium chloride, ondansetron, and valACYclovir. We will continue to administer cyanocobalamin.  Allergies as of 01/15/2023       Reactions   Benadryl [diphenhydramine Hcl] Nausea Only   Dizziness   Penicillins    Had a rash around 20 years ago but unsure if it come from medicine.  Tolerated Cephalosporin Date: 10/09/20.        Medication List        Accurate as of January 15, 2023 11:59 PM. If you have any questions, ask your nurse or doctor.          STOP taking these medications     hydrochlorothiazide 25 MG tablet Commonly known as: HYDRODIURIL Stopped by: Claretta Fraise, MD       TAKE these medications    albuterol 108 (90 Base) MCG/ACT inhaler Commonly known as: VENTOLIN HFA 2 puffs in lungs every 6 hours as needed for wheezing/short of breath.   amLODipine 5 MG tablet Commonly known as: NORVASC Take 1 tablet (5 mg total) by mouth daily.   anastrozole 1 MG tablet Commonly known as: ARIMIDEX Take 1 mg by mouth in the morning.   Calcium Carb-Cholecalciferol 600-20 MG-MCG Tabs Take 1 tablet by mouth in the morning.   Chlor-Trimeton 4 MG tablet Generic drug: chlorpheniramine Take 4 mg by mouth in the morning and at bedtime.   cholecalciferol 25 MCG (1000 UNIT) tablet Commonly known as: VITAMIN D3 Take 1,000 Units by mouth in the morning.  cyanocobalamin 1000 MCG/ML injection Commonly known as: VITAMIN B12 Inject 1 mL (1,000 mcg total) into the skin every 30 (thirty) days.   famotidine 40 MG tablet Commonly known as: PEPCID Take 1 tablet (40 mg total) by mouth daily.   fluticasone furoate-vilanterol 200-25 MCG/ACT Aepb Commonly known as: Breo Ellipta Inhale 1 puff into the lungs daily. What changed:  when to take this reasons to take this   hydrocortisone 2.5 % cream Apply topically in the morning, at noon, in the evening, and at bedtime. Apply to your anus 4 times a day.  Apothecary hemorrhoid cream with nitroglycerin added   hydrOXYzine 25 MG tablet Commonly known as: ATARAX Take 25 mg by mouth every 4 (four) hours as needed for itching.   L-Lysine 500 MG Tabs Take 500 mg by mouth at bedtime.   Lubricant Eye Drops 0.4-0.3 % Soln Generic drug: Polyethyl Glycol-Propyl Glycol Place 1-2 drops into both eyes 3 (three) times daily as needed (dry/irritated eyes.).   magnesium oxide 400 (240 Mg) MG tablet Commonly known as: MAG-OX Take 1 tablet (400 mg total) by mouth daily.   Melatonin 10 MG Caps Take 10 mg by mouth at bedtime.    ondansetron 4 MG tablet Commonly known as: ZOFRAN Take 4 mg by mouth every 8 (eight) hours as needed for vomiting or nausea.   potassium chloride 10 MEQ tablet Commonly known as: Klor-Con 10 Take 1 tablet (10 mEq total) by mouth daily.   raloxifene 60 MG tablet Commonly known as: EVISTA Take 1 tablet (60 mg total) by mouth daily.   Restasis 0.05 % ophthalmic emulsion Generic drug: cycloSPORINE Place 1 drop into both eyes 2 (two) times daily as needed (dry eyes).   rosuvastatin 20 MG tablet Commonly known as: CRESTOR Take 1 tablet (20 mg total) by mouth daily. What changed: when to take this   sodium chloride 0.65 % Soln nasal spray Commonly known as: OCEAN Place 1 spray into both nostrils as needed for congestion.   traZODone 100 MG tablet Commonly known as: DESYREL TAKE 1 TABLET AT BEDTIME   valACYclovir 500 MG tablet Commonly known as: VALTREX Take 500 mg by mouth daily as needed (outbreaks).       I tried to offer her perspective for liver and kidney function today. She is near panic. Reassured that both of these could be monitored and managed with lifestyle change - weight loss.   Will DC magnesium and potassium if those labs are acceptable.  Follow-up: Return in about 3 months (around 04/15/2023).  Claretta Fraise, M.D.

## 2023-01-15 NOTE — Telephone Encounter (Signed)
NOTED, LABS WERE JUST DRAWN TODAY

## 2023-01-16 LAB — CMP14+EGFR
ALT: 10 IU/L (ref 0–32)
AST: 14 IU/L (ref 0–40)
Albumin/Globulin Ratio: 2.1 (ref 1.2–2.2)
Albumin: 4.8 g/dL (ref 3.8–4.8)
Alkaline Phosphatase: 74 IU/L (ref 44–121)
BUN/Creatinine Ratio: 16 (ref 12–28)
BUN: 24 mg/dL (ref 8–27)
Bilirubin Total: 0.4 mg/dL (ref 0.0–1.2)
CO2: 24 mmol/L (ref 20–29)
Calcium: 10.1 mg/dL (ref 8.7–10.3)
Chloride: 100 mmol/L (ref 96–106)
Creatinine, Ser: 1.52 mg/dL — ABNORMAL HIGH (ref 0.57–1.00)
Globulin, Total: 2.3 g/dL (ref 1.5–4.5)
Glucose: 105 mg/dL — ABNORMAL HIGH (ref 70–99)
Potassium: 5.1 mmol/L (ref 3.5–5.2)
Sodium: 140 mmol/L (ref 134–144)
Total Protein: 7.1 g/dL (ref 6.0–8.5)
eGFR: 35 mL/min/{1.73_m2} — ABNORMAL LOW (ref >59)

## 2023-01-16 LAB — CBC WITH DIFFERENTIAL/PLATELET
Basophils Absolute: 0 10*3/uL (ref 0.0–0.2)
Basos: 1 %
EOS (ABSOLUTE): 0.2 10*3/uL (ref 0.0–0.4)
Eos: 3 %
Hematocrit: 34.6 % (ref 34.0–46.6)
Hemoglobin: 10.9 g/dL — ABNORMAL LOW (ref 11.1–15.9)
Immature Grans (Abs): 0 10*3/uL (ref 0.0–0.1)
Immature Granulocytes: 0 %
Lymphocytes Absolute: 1.7 10*3/uL (ref 0.7–3.1)
Lymphs: 28 %
MCH: 27 pg (ref 26.6–33.0)
MCHC: 31.5 g/dL (ref 31.5–35.7)
MCV: 86 fL (ref 79–97)
Monocytes Absolute: 0.5 10*3/uL (ref 0.1–0.9)
Monocytes: 8 %
Neutrophils Absolute: 3.8 10*3/uL (ref 1.4–7.0)
Neutrophils: 60 %
Platelets: 331 10*3/uL (ref 150–450)
RBC: 4.04 x10E6/uL (ref 3.77–5.28)
RDW: 14.3 % (ref 11.7–15.4)
WBC: 6.2 10*3/uL (ref 3.4–10.8)

## 2023-01-16 LAB — LIPID PANEL
Chol/HDL Ratio: 3.2 ratio (ref 0.0–4.4)
Cholesterol, Total: 139 mg/dL (ref 100–199)
HDL: 43 mg/dL (ref >39)
LDL Chol Calc (NIH): 67 mg/dL (ref 0–99)
Triglycerides: 174 mg/dL — ABNORMAL HIGH (ref 0–149)
VLDL Cholesterol Cal: 29 mg/dL (ref 5–40)

## 2023-01-16 LAB — MAGNESIUM: Magnesium: 2.2 mg/dL (ref 1.6–2.3)

## 2023-01-17 NOTE — Progress Notes (Signed)
Hello Tamaiya,  Your lab result is normal and/or stable.Some minor variations that are not significant are commonly marked abnormal, but do not represent any medical problem for you.  Best regards, Claretta Fraise, M.D.

## 2023-01-20 ENCOUNTER — Telehealth: Payer: Self-pay | Admitting: Family Medicine

## 2023-01-20 NOTE — Telephone Encounter (Signed)
Patient aware of lab results and copy placed in mail

## 2023-01-23 DIAGNOSIS — Z79811 Long term (current) use of aromatase inhibitors: Secondary | ICD-10-CM | POA: Diagnosis not present

## 2023-01-23 DIAGNOSIS — K7460 Unspecified cirrhosis of liver: Secondary | ICD-10-CM | POA: Diagnosis not present

## 2023-01-23 DIAGNOSIS — Z1231 Encounter for screening mammogram for malignant neoplasm of breast: Secondary | ICD-10-CM | POA: Diagnosis not present

## 2023-01-23 DIAGNOSIS — C3491 Malignant neoplasm of unspecified part of right bronchus or lung: Secondary | ICD-10-CM | POA: Diagnosis not present

## 2023-01-23 DIAGNOSIS — B354 Tinea corporis: Secondary | ICD-10-CM | POA: Diagnosis not present

## 2023-01-23 DIAGNOSIS — Z170 Estrogen receptor positive status [ER+]: Secondary | ICD-10-CM | POA: Diagnosis not present

## 2023-01-23 DIAGNOSIS — T50905A Adverse effect of unspecified drugs, medicaments and biological substances, initial encounter: Secondary | ICD-10-CM | POA: Diagnosis not present

## 2023-01-23 DIAGNOSIS — C50311 Malignant neoplasm of lower-inner quadrant of right female breast: Secondary | ICD-10-CM | POA: Diagnosis not present

## 2023-01-23 DIAGNOSIS — F17210 Nicotine dependence, cigarettes, uncomplicated: Secondary | ICD-10-CM | POA: Diagnosis not present

## 2023-01-23 DIAGNOSIS — R69 Illness, unspecified: Secondary | ICD-10-CM | POA: Diagnosis not present

## 2023-01-23 DIAGNOSIS — Z5181 Encounter for therapeutic drug level monitoring: Secondary | ICD-10-CM | POA: Diagnosis not present

## 2023-01-23 DIAGNOSIS — C73 Malignant neoplasm of thyroid gland: Secondary | ICD-10-CM | POA: Diagnosis not present

## 2023-01-23 DIAGNOSIS — M818 Other osteoporosis without current pathological fracture: Secondary | ICD-10-CM | POA: Diagnosis not present

## 2023-01-27 ENCOUNTER — Telehealth (INDEPENDENT_AMBULATORY_CARE_PROVIDER_SITE_OTHER): Payer: Medicare HMO | Admitting: Family Medicine

## 2023-01-27 NOTE — Progress Notes (Signed)
Error pt could not connect to Mychart video and did not consent to telephone visit due to possibility of ins not paying

## 2023-01-28 ENCOUNTER — Encounter: Payer: Self-pay | Admitting: Family Medicine

## 2023-01-28 ENCOUNTER — Ambulatory Visit (INDEPENDENT_AMBULATORY_CARE_PROVIDER_SITE_OTHER): Payer: Medicare HMO | Admitting: Family Medicine

## 2023-01-28 VITALS — BP 126/68 | HR 81 | Temp 97.8°F | Ht 61.0 in | Wt 172.6 lb

## 2023-01-28 DIAGNOSIS — K529 Noninfective gastroenteritis and colitis, unspecified: Secondary | ICD-10-CM

## 2023-01-28 NOTE — Progress Notes (Signed)
Subjective:  Patient ID: Renee Maldonado, female    DOB: [birth year], 77 y.o.   MRN: [id number]  Patient Care Team: Claretta Fraise, MD as PCP - General (Family Medicine) Karlene Einstein, MD as Referring Physician (Orthopedic Surgery)   Chief Complaint:  Diarrhea and Vomiting (Patient states she had diarrhea and vomiting yesterday.  Patient is worried about her kidney's since she has not been urinating as much in the last 2 days )   HPI: Renee Maldonado is a 77 y.o. female presenting on 01/28/2023 for Diarrhea and Vomiting (Patient states she had diarrhea and vomiting yesterday.  Patient is worried about her kidney's since she has not been urinating as much in the last 2 days )   Pt presents today for evaluation after an episode of vomiting and diarrhea yesterday. States she had several diarrhea stools yesterday, only vomited once. Concerned about her renal function as she has not been able to drink as much.   Diarrhea  This is a new problem. The current episode started yesterday. The problem has been resolved. The stool consistency is described as Watery. The patient states that diarrhea does not awaken her from sleep. Associated symptoms include increased flatus and vomiting. Pertinent negatives include no abdominal pain, arthralgias, bloating, chills, coughing, fever, headaches, myalgias, sweats, URI or weight loss. Nothing aggravates the symptoms. There are no known risk factors. She has tried change of diet and increased fluids for the symptoms. The treatment provided significant relief.       Relevant past medical, surgical, family, and social history reviewed and updated as indicated.  Allergies and medications reviewed and updated. Data reviewed: Chart in Epic.   Past Medical History:  Diagnosis Date   Anxiety    Arthritis    Cancer ([place])    breast Right no chemo or radiation  on Arimidex   COPD (chronic obstructive pulmonary disease) (HCC)    on Breo   GERD (gastroesophageal  reflux disease)    Humerus fracture 2007   Hyperlipidemia    Hypertension    Insomnia    Lung cancer ([place])    OA (osteoarthritis) of knee 10/08/2020   Thyroid cancer ([place]) 2023    Past Surgical History:  Procedure Laterality Date   BREAST LUMPECTOMY Right 02/2019   BREAST SURGERY Right 01/2019   breast biopsy   BRONCHIAL BIOPSY  04/15/2022   Procedure: BRONCHIAL BIOPSIES;  Surgeon: Garner Nash, DO;  Location: [facility];  Service: Pulmonary;;   BRONCHIAL BRUSHINGS  04/15/2022   Procedure: BRONCHIAL BRUSHINGS;  Surgeon: Garner Nash, DO;  Location: [facility];  Service: Pulmonary;;   BRONCHIAL NEEDLE ASPIRATION BIOPSY  04/15/2022   Procedure: BRONCHIAL NEEDLE ASPIRATION BIOPSIES;  Surgeon: Garner Nash, DO;  Location: [facility];  Service: Pulmonary;;   COLONOSCOPY  2022   ESOPHAGOGASTRODUODENOSCOPY (EGD) WITH PROPOFOL N/A 01/02/2023   Procedure: ESOPHAGOGASTRODUODENOSCOPY (EGD) WITH PROPOFOL;  Surgeon: Eloise Harman, DO;  [facility];  Service: Endoscopy;  Laterality: N/A;  12:45 pm, pt can't come earlier   FIDUCIAL MARKER PLACEMENT  04/15/2022   Procedure: FIDUCIAL MARKER PLACEMENT;  Surgeon: Garner Nash, DO;  Location: [facility];  Service: Pulmonary;;   JOINT REPLACEMENT     left knee   Left shoulder surgery     2007   RADICAL NECK DISSECTION Right 10/10/2022   Procedure: [place];  Surgeon: Jenetta Downer, MD;  Location: [facility];  Service: ENT;  Laterality: Right;   THYROIDECTOMY Right 10/10/2022  Procedure: RIGHT THYROID LOBECTOMY;  Surgeon: Jenetta Downer, MD;  Location: [facility];  Service: ENT;  Laterality: Right;   TOTAL KNEE ARTHROPLASTY     2012 left   TOTAL KNEE ARTHROPLASTY Right 10/08/2020   Procedure: TOTAL KNEE ARTHROPLASTY;  Surgeon: Gaynelle Arabian, MD;  Location: [facility];  Service: Orthopedics;  Laterality: Right;   TUBAL LIGATION  1978   VIDEO BRONCHOSCOPY WITH RADIAL ENDOBRONCHIAL ULTRASOUND  04/15/2022    Procedure: VIDEO BRONCHOSCOPY WITH RADIAL ENDOBRONCHIAL ULTRASOUND;  Surgeon: Garner Nash, DO;  Location: [facility];  Service: Pulmonary;;    Social History   Socioeconomic History   Marital status: Married    Spouse name: Jori Moll   Number of children: 3   Years of education: 16   Highest education level: Bachelor's degree (e.g., BA, AB, BS)  Occupational History   Occupation: retired  Tobacco Use   Smoking status: Former    Packs/day: 0.25    Years: 40.00    Total pack years: 10.00    Types: Cigarettes    Quit date: 06/17/2022    Years since quitting: 0.6   Smokeless tobacco: Never   Tobacco comments:    smokes 3 a day  Vaping Use   Vaping Use: Former   Start date: 07/18/2018   Quit date: 07/19/2019  Substance and Sexual Activity   Alcohol use: Yes    Alcohol/week: 4.0 standard drinks of alcohol    Types: 4 Standard drinks or equivalent per week    Comment: occasional, up to 4oz of vodka or whiskey per day for a week, then nothing for months.   Drug use: Never   Sexual activity: Not Currently  Other Topics Concern   Not on file  Social History Narrative   Lives home with her husband. Enjoys travelling, playing bingo, exercising at [facility], eats out a lot   Social Determinants of Health   Financial Resource Strain: Low Risk  (11/18/2022)   Overall Financial Resource Strain (CARDIA)    Difficulty of Paying Living Expenses: Not hard at all  Food Insecurity: No Food Insecurity (11/01/2022)   Hunger Vital Sign    Worried About Running Out of Food in the Last Year: Never true    Ran Out of Food in the Last Year: Never true  Transportation Needs: No Transportation Needs (11/18/2022)   PRAPARE - Hydrologist (Medical): No    Lack of Transportation (Non-Medical): No  Physical Activity: Insufficiently Active (08/02/2021)   Exercise Vital Sign    Days of Exercise per Week: 4 days    Minutes of Exercise per Session: 30 min  Stress: No  Stress Concern Present (08/02/2021)   [place]    Feeling of Stress : Not at all  Social Connections: [place] (08/02/2021)   Social Connection and Isolation Panel [NHANES]    Frequency of Communication with Friends and Family: More than three times a week    Frequency of Social Gatherings with Friends and Family: More than three times a week    Attends Religious Services: More than 4 times per year    Active Member of [employer] or Organizations: Yes    Attends Archivist Meetings: More than 4 times per year    Marital Status: Married  Human resources officer Violence: Not At Risk (11/01/2022)   Humiliation, Afraid, Rape, and Kick questionnaire    Fear of Current or Ex-Partner: No    Emotionally Abused: No  Physically Abused: No    Sexually Abused: No    Outpatient Encounter Medications as of 01/28/2023  Medication Sig   albuterol (VENTOLIN HFA) 108 (90 Base) MCG/ACT inhaler 2 puffs in lungs every 6 hours as needed for wheezing/short of breath.   amLODipine (NORVASC) 5 MG tablet Take 1 tablet (5 mg total) by mouth daily.   anastrozole (ARIMIDEX) 1 MG tablet Take 1 mg by mouth in the morning.   Calcium Carb-Cholecalciferol 600-20 MG-MCG TABS Take 1 tablet by mouth in the morning.   chlorpheniramine (CHLOR-TRIMETON) 4 MG tablet Take 4 mg by mouth in the morning and at bedtime.   cholecalciferol (VITAMIN D3) 25 MCG (1000 UNIT) tablet Take 1,000 Units by mouth in the morning.   cyanocobalamin (VITAMIN B12) 1000 MCG/ML injection Inject 1 mL (1,000 mcg total) into the skin every 30 (thirty) days.   famotidine (PEPCID) 40 MG tablet Take 1 tablet (40 mg total) by mouth daily.   fluticasone furoate-vilanterol (BREO ELLIPTA) 200-25 MCG/ACT AEPB Inhale 1 puff into the lungs daily. (Patient taking differently: Inhale 1 puff into the lungs daily as needed (shortness of breath).)   hydrocortisone 2.5 % cream Apply topically  in the morning, at noon, in the evening, and at bedtime. Apply to your anus 4 times a day.  Apothecary hemorrhoid cream with nitroglycerin added   hydrOXYzine (ATARAX/VISTARIL) 25 MG tablet Take 25 mg by mouth every 4 (four) hours as needed for itching.   L-Lysine 500 MG TABS Take 500 mg by mouth at bedtime.   magnesium oxide (MAG-OX) 400 (240 Mg) MG tablet Take 1 tablet (400 mg total) by mouth daily.   Melatonin 10 MG CAPS Take 10 mg by mouth at bedtime.   ondansetron (ZOFRAN) 4 MG tablet Take 4 mg by mouth every 8 (eight) hours as needed for vomiting or nausea.   Polyethyl Glycol-Propyl Glycol (LUBRICANT EYE DROPS) 0.4-0.3 % SOLN Place 1-2 drops into both eyes 3 (three) times daily as needed (dry/irritated eyes.).   potassium chloride (KLOR-CON 10) 10 MEQ tablet Take 1 tablet (10 mEq total) by mouth daily.   raloxifene (EVISTA) 60 MG tablet Take 1 tablet (60 mg total) by mouth daily.   RESTASIS 0.05 % ophthalmic emulsion Place 1 drop into both eyes 2 (two) times daily as needed (dry eyes).    rosuvastatin (CRESTOR) 20 MG tablet Take 1 tablet (20 mg total) by mouth daily.   sodium chloride (OCEAN) 0.65 % SOLN nasal spray Place 1 spray into both nostrils as needed for congestion.   traZODone (DESYREL) 100 MG tablet TAKE 1 TABLET AT BEDTIME   valACYclovir (VALTREX) 500 MG tablet Take 500 mg by mouth daily as needed (outbreaks).   Facility-Administered Encounter Medications as of 01/28/2023  Medication   cyanocobalamin (VITAMIN B12) injection 1,000 mcg    Allergies  Allergen Reactions   Benadryl [Diphenhydramine Hcl] Nausea Only    Dizziness   Penicillins     Had a rash around 20 years ago but unsure if it come from medicine.   Tolerated Cephalosporin Date: 10/09/20.      Review of Systems  Constitutional:  Positive for appetite change. Negative for activity change, chills, diaphoresis, fatigue, fever, unexpected weight change and weight loss.  HENT: Negative.    Eyes: Negative.   Negative for photophobia and visual disturbance.  Respiratory:  Negative for cough, chest tightness and shortness of breath.   Cardiovascular:  Negative for chest pain, palpitations and leg swelling.  Gastrointestinal:  Positive for diarrhea, flatus  and vomiting. Negative for abdominal distention, abdominal pain, anal bleeding, bloating, blood in stool, constipation, nausea and rectal pain.  Endocrine: Negative.   Genitourinary:  Negative for decreased urine volume, difficulty urinating, dysuria, frequency and urgency.  Musculoskeletal:  Negative for arthralgias and myalgias.  Skin: Negative.   Allergic/Immunologic: Negative.   Neurological:  Negative for dizziness, tremors, seizures, syncope, facial asymmetry, speech difficulty, weakness, light-headedness, numbness and headaches.  Hematological: Negative.   Psychiatric/Behavioral:  Negative for confusion, hallucinations, sleep disturbance and suicidal ideas.   All other systems reviewed and are negative.       Objective:  BP 126/68   Pulse 81   Temp 97.8 F (36.6 C) (Temporal)   Ht '5\' 1"'$  (1.549 m)   Wt 172 lb 9.6 oz (78.3 kg)   SpO2 95%   BMI 32.61 kg/m    Wt Readings from Last 3 Encounters:  01/28/23 172 lb 9.6 oz (78.3 kg)  01/15/23 173 lb (78.5 kg)  12/18/22 174 lb (78.9 kg)    Physical Exam Vitals and nursing note reviewed.  Constitutional:      General: She is not in acute distress.    Appearance: Normal appearance. She is obese. She is not ill-appearing, toxic-appearing or diaphoretic.  HENT:     Head: Normocephalic and atraumatic.     Mouth/Throat:     Mouth: Mucous membranes are moist.  Eyes:     Conjunctiva/sclera: Conjunctivae normal.     Pupils: Pupils are equal, round, and reactive to light.  Cardiovascular:     Rate and Rhythm: Normal rate and regular rhythm.     Heart sounds: Normal heart sounds.  Pulmonary:     Effort: Pulmonary effort is normal.     Breath sounds: Normal breath sounds.  Abdominal:      General: Bowel sounds are normal. There is no distension.     Palpations: Abdomen is soft. There is no mass.     Tenderness: There is no abdominal tenderness. There is no right CVA tenderness, left CVA tenderness, guarding or rebound.     Hernia: No hernia is present.  Musculoskeletal:     Right lower leg: No edema.     Left lower leg: No edema.  Skin:    General: Skin is warm and dry.     Capillary Refill: Capillary refill takes less than 2 seconds.  Neurological:     General: No focal deficit present.     Mental Status: She is alert and oriented to person, place, and time.  Psychiatric:        Attention and Perception: Attention and perception normal.        Mood and Affect: Mood normal.        Speech: Speech normal.        Behavior: Behavior normal.        Thought Content: Thought content normal.        Judgment: Judgment normal.     Results for orders placed or performed in visit on 01/15/23  CBC with Differential/Platelet  Result Value Ref Range   WBC 6.2 3.4 - 10.8 x10E3/uL   RBC 4.04 3.77 - 5.28 x10E6/uL   Hemoglobin 10.9 (L) 11.1 - 15.9 g/dL   Hematocrit 34.6 34.0 - 46.6 %   MCV 86 79 - 97 fL   MCH 27.0 26.6 - 33.0 pg   MCHC 31.5 31.5 - 35.7 g/dL   RDW 14.3 11.7 - 15.4 %   Platelets 331 150 - 450 x10E3/uL   Neutrophils  60 Not Estab. %   Lymphs 28 Not Estab. %   Monocytes 8 Not Estab. %   Eos 3 Not Estab. %   Basos 1 Not Estab. %   Neutrophils Absolute 3.8 1.4 - 7.0 x10E3/uL   Lymphocytes Absolute 1.7 0.7 - 3.1 x10E3/uL   Monocytes Absolute 0.5 0.1 - 0.9 x10E3/uL   EOS (ABSOLUTE) 0.2 0.0 - 0.4 x10E3/uL   Basophils Absolute 0.0 0.0 - 0.2 x10E3/uL   Immature Granulocytes 0 Not Estab. %   Immature Grans (Abs) 0.0 0.0 - 0.1 x10E3/uL  CMP14+EGFR  Result Value Ref Range   Glucose 105 (H) 70 - 99 mg/dL   BUN 24 8 - 27 mg/dL   Creatinine, Ser 1.52 (H) 0.57 - 1.00 mg/dL   eGFR 35 (L) >59 mL/min/1.73   BUN/Creatinine Ratio 16 12 - 28   Sodium 140 134 - 144  mmol/L   Potassium 5.1 3.5 - 5.2 mmol/L   Chloride 100 96 - 106 mmol/L   CO2 24 20 - 29 mmol/L   Calcium 10.1 8.7 - 10.3 mg/dL   Total Protein 7.1 6.0 - 8.5 g/dL   Albumin 4.8 3.8 - 4.8 g/dL   Globulin, Total 2.3 1.5 - 4.5 g/dL   Albumin/Globulin Ratio 2.1 1.2 - 2.2   Bilirubin Total 0.4 0.0 - 1.2 mg/dL   Alkaline Phosphatase 74 44 - 121 IU/L   AST 14 0 - 40 IU/L   ALT 10 0 - 32 IU/L  Lipid panel  Result Value Ref Range   Cholesterol, Total 139 100 - 199 mg/dL   Triglycerides 174 (H) 0 - 149 mg/dL   HDL 43 >39 mg/dL   VLDL Cholesterol Cal 29 5 - 40 mg/dL   LDL Chol Calc (NIH) 67 0 - 99 mg/dL   Chol/HDL Ratio 3.2 0.0 - 4.4 ratio  Magnesium  Result Value Ref Range   Magnesium 2.2 1.6 - 2.3 mg/dL       Pertinent labs & imaging results that were available during my care of the patient were reviewed by me and considered in my medical decision making.  Assessment & Plan:  Renee Maldonado was seen today for diarrhea and vomiting.  Diagnoses and all orders for this visit:  Gastroenteritis BRAT diet discussed in detail. Greatly improved over the last day. Will check renal function today. Report new, worsening, or persistent symptoms.  -     CMP14+EGFR     Continue all other maintenance medications.  Follow up plan: Return if symptoms worsen or fail to improve.   Continue healthy lifestyle choices, including diet (rich in fruits, vegetables, and lean proteins, and low in salt and simple carbohydrates) and exercise (at least 30 minutes of moderate physical activity daily).  Educational handout given for food choices to help diarrhea  The above assessment and management plan was discussed with the patient. The patient verbalized understanding of and has agreed to the management plan. Patient is aware to call the clinic if they develop any new symptoms or if symptoms persist or worsen. Patient is aware when to return to the clinic for a follow-up visit. Patient educated on when it is  appropriate to go to the emergency department.   Monia Pouch, FNP-C [facility] [phone]

## 2023-01-29 LAB — CMP14+EGFR
ALT: 20 IU/L (ref 0–32)
AST: 26 IU/L (ref 0–40)
Albumin/Globulin Ratio: 2.1 (ref 1.2–2.2)
Albumin: 4.6 g/dL (ref 3.8–4.8)
Alkaline Phosphatase: 63 IU/L (ref 44–121)
BUN/Creatinine Ratio: 15 (ref 12–28)
BUN: 23 mg/dL (ref 8–27)
Bilirubin Total: 0.5 mg/dL (ref 0.0–1.2)
CO2: 22 mmol/L (ref 20–29)
Calcium: 9.5 mg/dL (ref 8.7–10.3)
Chloride: 96 mmol/L (ref 96–106)
Creatinine, Ser: 1.55 mg/dL — ABNORMAL HIGH (ref 0.57–1.00)
Globulin, Total: 2.2 g/dL (ref 1.5–4.5)
Glucose: 103 mg/dL — ABNORMAL HIGH (ref 70–99)
Potassium: 4.1 mmol/L (ref 3.5–5.2)
Sodium: 134 mmol/L (ref 134–144)
Total Protein: 6.8 g/dL (ref 6.0–8.5)
eGFR: 34 mL/min/{1.73_m2} — ABNORMAL LOW (ref >59)

## 2023-01-30 ENCOUNTER — Emergency Department (HOSPITAL_COMMUNITY)
Admission: EM | Admit: 2023-01-30 | Discharge: 2023-01-30 | Disposition: A | Discharge status: Home / Self Care | Payer: Medicare HMO | Attending: Emergency Medicine | Admitting: Emergency Medicine

## 2023-01-30 ENCOUNTER — Encounter (HOSPITAL_COMMUNITY): Payer: Self-pay | Admitting: Emergency Medicine

## 2023-01-30 ENCOUNTER — Other Ambulatory Visit: Payer: Self-pay

## 2023-01-30 DIAGNOSIS — E876 Hypokalemia: Secondary | ICD-10-CM | POA: Diagnosis not present

## 2023-01-30 DIAGNOSIS — R110 Nausea: Secondary | ICD-10-CM | POA: Diagnosis not present

## 2023-01-30 DIAGNOSIS — R197 Diarrhea, unspecified: Secondary | ICD-10-CM | POA: Diagnosis not present

## 2023-01-30 LAB — BASIC METABOLIC PANEL
Anion gap: 9 (ref 5–15)
BUN: 20 mg/dL (ref 8–23)
CO2: 21 mmol/L — ABNORMAL LOW (ref 22–32)
Calcium: 8.4 mg/dL — ABNORMAL LOW (ref 8.9–10.3)
Chloride: 100 mmol/L (ref 98–111)
Creatinine, Ser: 1.58 mg/dL — ABNORMAL HIGH (ref 0.44–1.00)
GFR, Estimated: 34 mL/min — ABNORMAL LOW (ref >60)
Glucose, Bld: 109 mg/dL — ABNORMAL HIGH (ref 70–99)
Potassium: 3.4 mmol/L — ABNORMAL LOW (ref 3.5–5.1)
Sodium: 130 mmol/L — ABNORMAL LOW (ref 135–145)

## 2023-01-30 MED ORDER — LOPERAMIDE HCL 2 MG PO CAPS
4.0000 mg | ORAL_CAPSULE | Freq: Once | ORAL | Status: AC
  Administered 2023-01-30: 4 mg via ORAL
  Filled 2023-01-30: qty 2

## 2023-01-30 MED ORDER — DIPHENOXYLATE-ATROPINE 2.5-0.025 MG PO TABS: 1.0000 | ORAL_TABLET | Freq: Four times a day (QID) | ORAL | 0 refills | Status: DC | PRN

## 2023-01-30 NOTE — ED Triage Notes (Signed)
Pt c/o continued diarrhea for awhile. Pt states she was seen on 3/13 for same by PCP. Pt states she is concerned for dehydration.

## 2023-01-30 NOTE — ED Provider Notes (Signed)
[facility] Provider Note   CSN: [id number] Arrival date & time: 01/30/23  0251     History  Chief Complaint  Patient presents with   Diarrhea    Renee Maldonado is a 77 y.o. female.  Patient presents to the emergency department for evaluation of diarrhea.  Patient has had diarrhea for 2 days.  She is concerned because in December of last year she had a prolonged diarrheal illness put her in the hospital for prolonged period of time with acute kidney injury and electrolyte abnormalities.  He saw a nurse practitioner yesterday who told her to "let it run its course" so she has not taken any medications for the diarrhea.  She had some nausea the first day but took Zofran and that has resolved.  No fever or abdominal pain.  No rectal bleeding or melena.       Home Medications Prior to Admission medications   Medication Sig Start Date End Date Taking? Authorizing Provider  diphenoxylate-atropine (LOMOTIL) 2.5-0.025 MG tablet Take 1 tablet by mouth 4 (four) times daily as needed for diarrhea or loose stools. 01/30/23  Yes Jvion Turgeon, Gwenyth Allegra, MD  albuterol (VENTOLIN HFA) 108 (90 Base) MCG/ACT inhaler 2 puffs in lungs every 6 hours as needed for wheezing/short of breath. 12/13/20   Claretta Fraise, MD  amLODipine (NORVASC) 5 MG tablet Take 1 tablet (5 mg total) by mouth daily. 07/14/22   Claretta Fraise, MD  anastrozole (ARIMIDEX) 1 MG tablet Take 1 mg by mouth in the morning. 07/22/19   [provider]  Calcium Carb-Cholecalciferol 600-20 MG-MCG TABS Take 1 tablet by mouth in the morning.    [provider]  chlorpheniramine (CHLOR-TRIMETON) 4 MG tablet Take 4 mg by mouth in the morning and at bedtime.    [provider]  cholecalciferol (VITAMIN D3) 25 MCG (1000 UNIT) tablet Take 1,000 Units by mouth in the morning.    [provider]  cyanocobalamin (VITAMIN B12) 1000 MCG/ML injection Inject 1 mL (1,000 mcg total)  into the skin every 30 (thirty) days. 07/14/22   Claretta Fraise, MD  famotidine (PEPCID) 40 MG tablet Take 1 tablet (40 mg total) by mouth daily. 07/14/22   Claretta Fraise, MD  fluticasone furoate-vilanterol (BREO ELLIPTA) 200-25 MCG/ACT AEPB Inhale 1 puff into the lungs daily. Patient taking differently: Inhale 1 puff into the lungs daily as needed (shortness of breath). 07/14/22   Claretta Fraise, MD  hydrocortisone 2.5 % cream Apply topically in the morning, at noon, in the evening, and at bedtime. Apply to your anus 4 times a day.  Apothecary hemorrhoid cream with nitroglycerin added 11/13/22   Pappayliou, Barnetta Chapel A, DO  hydrOXYzine (ATARAX/VISTARIL) 25 MG tablet Take 25 mg by mouth every 4 (four) hours as needed for itching.    [provider]  L-Lysine 500 MG TABS Take 500 mg by mouth at bedtime.    [provider]  magnesium oxide (MAG-OX) 400 (240 Mg) MG tablet Take 1 tablet (400 mg total) by mouth daily. 12/15/22   Claretta Fraise, MD  Melatonin 10 MG CAPS Take 10 mg by mouth at bedtime.    [provider]  ondansetron (ZOFRAN) 4 MG tablet Take 4 mg by mouth every 8 (eight) hours as needed for vomiting or nausea. 10/31/22   [provider]  Polyethyl Glycol-Propyl Glycol (LUBRICANT EYE DROPS) 0.4-0.3 % SOLN Place 1-2 drops into both eyes 3 (three) times daily as needed (dry/irritated eyes.).  [provider]  potassium chloride (KLOR-CON 10) 10 MEQ tablet Take 1 tablet (10 mEq total) by mouth daily. 12/15/22   Claretta Fraise, MD  raloxifene (EVISTA) 60 MG tablet Take 1 tablet (60 mg total) by mouth daily. 07/14/22   Claretta Fraise, MD  RESTASIS 0.05 % ophthalmic emulsion Place 1 drop into both eyes 2 (two) times daily as needed (dry eyes).  01/18/14   [provider]  rosuvastatin (CRESTOR) 20 MG tablet Take 1 tablet (20 mg total) by mouth daily. 01/15/23   Claretta Fraise, MD  sodium chloride (OCEAN) 0.65 % SOLN nasal spray Place 1 spray into  both nostrils as needed for congestion.    [provider]  traZODone (DESYREL) 100 MG tablet TAKE 1 TABLET AT BEDTIME 05/12/22   Claretta Fraise, MD  valACYclovir (VALTREX) 500 MG tablet Take 500 mg by mouth daily as needed (outbreaks).    [provider]      Allergies    Benadryl [diphenhydramine hcl] and Penicillins    Review of Systems   Review of Systems  Physical Exam Updated Vital Signs BP 120/71   Pulse 80   Temp 97.9 F (36.6 C) (Oral)   Resp 17   Ht 5\' 1"  (1.549 m)   Wt 78.3 kg   SpO2 95%   BMI 32.61 kg/m  Physical Exam Vitals and nursing note reviewed.  Constitutional:      General: She is not in acute distress.    Appearance: She is well-developed.  HENT:     Head: Normocephalic and atraumatic.     Mouth/Throat:     Mouth: Mucous membranes are moist.  Eyes:     General: Vision grossly intact. Gaze aligned appropriately.     Extraocular Movements: Extraocular movements intact.     Conjunctiva/sclera: Conjunctivae normal.  Cardiovascular:     Rate and Rhythm: Normal rate and regular rhythm.     Pulses: Normal pulses.     Heart sounds: Normal heart sounds, S1 normal and S2 normal. No murmur heard.    No friction rub. No gallop.  Pulmonary:     Effort: Pulmonary effort is normal. No respiratory distress.     Breath sounds: Normal breath sounds.  Abdominal:     General: Bowel sounds are normal.     Palpations: Abdomen is soft.     Tenderness: There is no abdominal tenderness. There is no guarding or rebound.     Hernia: No hernia is present.  Musculoskeletal:        General: No swelling.     Cervical back: Full passive range of motion without pain, normal range of motion and neck supple. No spinous process tenderness or muscular tenderness. Normal range of motion.     Right lower leg: No edema.     Left lower leg: No edema.  Skin:    General: Skin is warm and dry.     Capillary Refill: Capillary refill takes less than 2 seconds.      Findings: No ecchymosis, erythema, rash or wound.  Neurological:     General: No focal deficit present.     Mental Status: She is alert and oriented to person, place, and time.     GCS: GCS eye subscore is 4. GCS verbal subscore is 5. GCS motor subscore is 6.     Cranial Nerves: Cranial nerves 2-12 are intact.     Sensory: Sensation is intact.     Motor: Motor function is intact.  Coordination: Coordination is intact.  Psychiatric:        Attention and Perception: Attention normal.        Mood and Affect: Mood normal.        Speech: Speech normal.        Behavior: Behavior normal.     ED Results / Procedures / Treatments   Labs (all labs ordered are listed, but only abnormal results are displayed) Labs Reviewed  BASIC METABOLIC PANEL - Abnormal; Notable for the following components:      Result Value   Sodium 130 (*)    Potassium 3.4 (*)    CO2 21 (*)    Glucose, Bld 109 (*)    Creatinine, Ser 1.58 (*)    Calcium 8.4 (*)    GFR, Estimated 34 (*)    All other components within normal limits    EKG None  Radiology No results found.  Procedures Procedures    Medications Ordered in ED Medications  loperamide (IMODIUM) capsule 4 mg (4 mg Oral Given 01/30/23 [id number])    ED Course/ Medical Decision Making/ A&P                             Medical Decision Making Amount and/or Complexity of Data Reviewed External Data Reviewed: labs and notes. Labs: ordered. Decision-making details documented in ED Course.  Risk Prescription drug management.   Presents to the emergency department for evaluation of diarrhea.  Differential diagnosis considered includes, but not limited to: Gastroenteritis, colitis, C. Difficile  Patient's records were reviewed.  She did have a prolonged diarrheal illness in December that resulted in a lengthy hospital stay.  There was never a diagnosis of what caused the diarrhea at that time.  Peers well tonight.  Abdominal exam is benign, no  tenderness.  Vital signs are normal.  Electrolytes reveal very slight hypokalemia.  She does have a history of chronic hypokalemia and has just filled a prescription for potassium and magnesium, has not started it yet.  She has been drinking Gatorade and water.  Advised her to try and get some increased sodium with the fluid resuscitation.  Kidney function is at about baseline.  She was reassured, continue doing what she is doing.  Will prescribe Lomotil to be used as needed.        Final Clinical Impression(s) / ED Diagnoses Final diagnoses:  Diarrhea, unspecified type    Rx / DC Orders ED Discharge Orders          Ordered    diphenoxylate-atropine (LOMOTIL) 2.5-0.025 MG tablet  4 times daily PRN        01/30/23 0626              Orpah Greek, MD 01/30/23 [phone]

## 2023-01-30 NOTE — Discharge Instructions (Signed)
Continue to drink lots of fluids.  Try to get some sodium with this, eat some saltines or other salty snack.  Start the potassium and magnesium prescription.  Follow-up prescription for Lomotil.  You can take 1 of these tablets up to 4 times a day.  Only take it if you are having diarrhea, if there is no diarrhea, hold the medication.

## 2023-02-02 DIAGNOSIS — Z9009 Acquired absence of other part of head and neck: Secondary | ICD-10-CM | POA: Diagnosis not present

## 2023-02-03 ENCOUNTER — Telehealth: Payer: Self-pay

## 2023-02-03 NOTE — Transitions of Care (Post Inpatient/ED Visit) (Signed)
   02/03/2023  Name: Renee Maldonado MRN: [id number] DOB: [birth year]  Today's TOC FU Call Status: Today's TOC FU Call Status:: Successful TOC FU Call Competed TOC FU Call Complete Date: 02/03/23  Transition Care Management Follow-up Telephone Call Date of Discharge: 01/30/23 Discharge [facility] Type of Discharge: Emergency Department Reason for ED Visit: Other: ("diarrhea,unspecified type") How have you been since you were released from the hospital?: Better (She reports that she took one dose of Lomotil prescribed by ED MD and diarrhea turned into constipation.She then drank some warm prune juice to relieve constipation. Since then bowels/stools have been WNL for her and she has had no further GI issues.) Any questions or concerns?: No  Items Reviewed: Did you receive and understand the discharge instructions provided?: Yes Medications obtained and verified?: Yes (Medications Reviewed) Any new allergies since your discharge?: No Dietary orders reviewed?: NA Do you have support at home?: Yes People in Home: spouse Name of Support/Comfort Primary Source: [place] and Equipment/Supplies: [place] Ordered?: NA Any new equipment or medical supplies ordered?: NA  Functional Questionnaire: Do you need assistance with bathing/showering or dressing?: No Do you need assistance with meal preparation?: No Do you need assistance with eating?: No Do you have difficulty maintaining continence: No Do you need assistance with getting out of bed/getting out of a chair/moving?: No Do you have difficulty managing or taking your medications?: No   Red on EMMI-ED Discharge Alert Date & Reason: 01/31/23 "Scheduled follow-up appt? No"  Follow up appointments reviewed: PCP Follow-up appointment confirmed?: No Specialist Hospital Follow-up appointment confirmed?: NA Do you need transportation to your follow-up appointment?: No Do you understand care options if  your condition(s) worsen?: Yes-patient verbalized understanding  SDOH Interventions Today    Flowsheet Row Most Recent Value  SDOH Interventions   Food Insecurity Interventions Intervention Not Indicated  Transportation Interventions Intervention Not Indicated      TOC Interventions Today    Flowsheet Row Most Recent Value  TOC Interventions   TOC Interventions Discussed/Reviewed TOC Interventions Discussed      Interventions Today    Flowsheet Row Most Recent Value  General Interventions   General Interventions Discussed/Reviewed General Interventions Discussed, Doctor Visits  Doctor Visits Discussed/Reviewed Doctor Visits Discussed  [pt declined making PCP follow up appt since sxs resolved]  Nutrition Interventions   Nutrition Discussed/Reviewed Nutrition Discussed, Adding fruits and vegetables, Fluid intake  Pharmacy Interventions   Pharmacy Dicussed/Reviewed Pharmacy Topics Discussed, Medications and their functions       Hetty Blend [facility] Care Management Care Management Community Coordinator Direct Phone: [phone] Toll Free: [phone] Fax: [phone]

## 2023-02-04 ENCOUNTER — Telehealth: Payer: Self-pay

## 2023-02-04 NOTE — Telephone Encounter (Signed)
        Patient  visited [facility] on 3/15     Telephone encounter attempt :  1st  A HIPAA compliant voice message was left requesting a return call.  Instructed patient to call back    [facility] [phone] [address], [place], [place] [address] Phone: [phone] Email: [email or web address]

## 2023-02-05 ENCOUNTER — Other Ambulatory Visit: Payer: Medicare HMO

## 2023-02-05 ENCOUNTER — Ambulatory Visit (INDEPENDENT_AMBULATORY_CARE_PROVIDER_SITE_OTHER): Payer: Medicare HMO

## 2023-02-05 DIAGNOSIS — E538 Deficiency of other specified B group vitamins: Secondary | ICD-10-CM

## 2023-02-05 DIAGNOSIS — Z9009 Acquired absence of other part of head and neck: Secondary | ICD-10-CM | POA: Diagnosis not present

## 2023-02-05 NOTE — Progress Notes (Signed)
Cyanocobalamin injection given to right deltoid.  Patient tolerated well. 

## 2023-02-06 ENCOUNTER — Ambulatory Visit (INDEPENDENT_AMBULATORY_CARE_PROVIDER_SITE_OTHER): Payer: Medicare HMO

## 2023-02-06 DIAGNOSIS — Z23 Encounter for immunization: Secondary | ICD-10-CM

## 2023-03-03 ENCOUNTER — Telehealth: Payer: Self-pay | Admitting: *Deleted

## 2023-03-03 ENCOUNTER — Telehealth: Payer: Self-pay | Admitting: Urology

## 2023-03-03 NOTE — Telephone Encounter (Signed)
4/16 @ 3:55 pm Left voicemail for someone to call her back concerning her appointment with Ashlyn.  She has questions.  Left voicemail with Darryl Nestle.  So they are aware.

## 2023-03-03 NOTE — Telephone Encounter (Signed)
Returned patient's phone call, spoke with patient 

## 2023-03-09 ENCOUNTER — Ambulatory Visit (INDEPENDENT_AMBULATORY_CARE_PROVIDER_SITE_OTHER): Payer: Medicare HMO

## 2023-03-09 DIAGNOSIS — E538 Deficiency of other specified B group vitamins: Secondary | ICD-10-CM | POA: Diagnosis not present

## 2023-03-09 NOTE — Progress Notes (Signed)
Cyanocobalamin injection given to left deltoid.  Patient tolerated well.  Patient supplied own medication for this injection.

## 2023-03-10 ENCOUNTER — Other Ambulatory Visit: Payer: Self-pay | Admitting: Urology

## 2023-03-10 DIAGNOSIS — C3491 Malignant neoplasm of unspecified part of right bronchus or lung: Secondary | ICD-10-CM

## 2023-03-11 ENCOUNTER — Telehealth: Payer: Self-pay | Admitting: *Deleted

## 2023-03-11 NOTE — Telephone Encounter (Signed)
Called patient to inform of Ct for 04-24-23- arrival time- 6:45 pm @ [facility], no restrictions to test, patient to receive results from [employer] on 04-29-23 @ 11:30 am via telephone, lvm for a return call

## 2023-03-18 ENCOUNTER — Ambulatory Visit: Payer: Self-pay | Admitting: Urology

## 2023-03-30 ENCOUNTER — Telehealth: Payer: Self-pay | Admitting: Family Medicine

## 2023-03-30 NOTE — Telephone Encounter (Signed)
Contacted Renee Maldonado to schedule their annual wellness visit. Appointment made for 08/07/2023.  Thank you,  Judeth Cornfield,  AMB Clinical Support [facility] AWV Program Direct Dial ??[phone]

## 2023-04-10 ENCOUNTER — Ambulatory Visit: Payer: Medicare HMO

## 2023-04-14 ENCOUNTER — Ambulatory Visit: Payer: Medicare HMO

## 2023-04-15 ENCOUNTER — Ambulatory Visit (INDEPENDENT_AMBULATORY_CARE_PROVIDER_SITE_OTHER): Payer: Medicare HMO | Admitting: Family Medicine

## 2023-04-15 ENCOUNTER — Encounter: Payer: Self-pay | Admitting: Family Medicine

## 2023-04-15 VITALS — BP 173/86 | HR 70 | Temp 98.2°F | Ht 61.0 in | Wt 169.2 lb

## 2023-04-15 DIAGNOSIS — I10 Essential (primary) hypertension: Secondary | ICD-10-CM | POA: Diagnosis not present

## 2023-04-15 DIAGNOSIS — E538 Deficiency of other specified B group vitamins: Secondary | ICD-10-CM | POA: Diagnosis not present

## 2023-04-15 DIAGNOSIS — E782 Mixed hyperlipidemia: Secondary | ICD-10-CM

## 2023-04-15 DIAGNOSIS — E890 Postprocedural hypothyroidism: Secondary | ICD-10-CM | POA: Diagnosis not present

## 2023-04-15 DIAGNOSIS — R6889 Other general symptoms and signs: Secondary | ICD-10-CM | POA: Diagnosis not present

## 2023-04-15 MED ORDER — TRAZODONE HCL 100 MG PO TABS: 100.0000 mg | ORAL_TABLET | Freq: Every day | ORAL | 3 refills | Status: DC

## 2023-04-15 MED ORDER — RALOXIFENE HCL 60 MG PO TABS: 60.0000 mg | ORAL_TABLET | Freq: Every day | ORAL | 2 refills | Status: DC

## 2023-04-15 NOTE — Progress Notes (Signed)
Subjective:  Patient ID: Renee Maldonado, female    DOB: 29-Mar-1946  Age: 77 y.o. MRN: [id number]  CC: Medical Management of Chronic Issues   HPI Mckenly Genao presents for  follow-up on  thyroid. The patient has a history of hypothyroidism for many years. It has been stable recently. Pt. denies any change in  voice, loss of hair, heat or cold intolerance. Energy level has been adequate to good. Patient denies constipation and diarrhea. No myxedema. Medication is as noted below. Verified that pt is taking it daily on an empty stomach. Well tolerated.  Recent cancer treatment with thyroidectomy     04/15/2023   10:14 AM 01/15/2023   12:58 PM 12/09/2022    9:30 AM  Depression screen PHQ 2/9  Decreased Interest 0 0 1  Down, Depressed, Hopeless 0 0 0  PHQ - 2 Score 0 0 1  Altered sleeping   1  Tired, decreased energy   1  Change in appetite   1  Feeling bad or failure about yourself    0  Trouble concentrating   0  Moving slowly or fidgety/restless   0  Suicidal thoughts   0  PHQ-9 Score   4  Difficult doing work/chores   Not difficult at all    History Nami has a past medical history of Anxiety, Arthritis, Cancer (HCC), COPD (chronic obstructive pulmonary disease) (HCC), GERD (gastroesophageal reflux disease), Humerus fracture (2007), Hyperlipidemia, Hypertension, Insomnia, Lung cancer (HCC), OA (osteoarthritis) of knee (10/08/2020), and Thyroid cancer (HCC) (2023).   She has a past surgical history that includes Total knee arthroplasty; Left shoulder surgery; Joint replacement; Tubal ligation (1978); Breast surgery (Right, 01/2019); Breast lumpectomy (Right, 02/2019); Total knee arthroplasty (Right, 10/08/2020); Bronchial biopsy (04/15/2022); Bronchial needle aspiration biopsy (04/15/2022); Bronchial brushings (04/15/2022); Video bronchoscopy with radial endobronchial ultrasound (04/15/2022); Fiducial marker placement (04/15/2022); Colonoscopy (2022); Thyroidectomy (Right, 10/10/2022);  Radical neck dissection (Right, 10/10/2022); and Esophagogastroduodenoscopy (egd) with propofol (N/A, 01/02/2023).   Her family history includes Alzheimer's disease in her mother; Cancer in her brother; Diabetes in her mother; Hypertension in her father; Stroke in her father.She reports that she quit smoking about 9 months ago. Her smoking use included cigarettes. She has a 10.00 pack-year smoking history. She has never used smokeless tobacco. She reports current alcohol use of about 4.0 standard drinks of alcohol per week. She reports that she does not use drugs.    ROS Review of Systems  Constitutional: Negative.   HENT: Negative.    Eyes:  Negative for visual disturbance.  Respiratory:  Negative for shortness of breath.   Cardiovascular:  Negative for chest pain.  Gastrointestinal:  Negative for abdominal pain.  Musculoskeletal:  Negative for arthralgias.    Objective:  BP (!) 173/86   Pulse 70   Temp 98.2 F (36.8 C)   Ht 5\' 1"  (1.549 m)   Wt 169 lb 3.2 oz (76.7 kg)   SpO2 97%   BMI 31.97 kg/m   BP Readings from Last 3 Encounters:  04/15/23 (!) 173/86  01/30/23 122/70  01/28/23 126/68    Wt Readings from Last 3 Encounters:  04/15/23 169 lb 3.2 oz (76.7 kg)  01/30/23 172 lb 9.6 oz (78.3 kg)  01/28/23 172 lb 9.6 oz (78.3 kg)     Physical Exam Constitutional:      General: She is not in acute distress.    Appearance: She is well-developed.  Cardiovascular:     Rate and Rhythm: Normal rate and regular rhythm.  Pulmonary:     Breath sounds: Normal breath sounds.  Musculoskeletal:        General: Normal range of motion.  Skin:    General: Skin is warm and dry.  Neurological:     Mental Status: She is alert and oriented to person, place, and time.       Assessment & Plan:   Mireia was seen today for medical management of chronic issues.  Diagnoses and all orders for this visit:  Mixed hyperlipidemia -     Lipid panel  Primary hypertension -     CBC  with Differential/Platelet -     CMP14+EGFR  B12 deficiency -     Iron, TIBC and Ferritin Panel -     Vitamin B12  Postoperative hypothyroidism -     TSH + free T4  Other orders -     raloxifene (EVISTA) 60 MG tablet; Take 1 tablet (60 mg total) by mouth daily. -     traZODone (DESYREL) 100 MG tablet; Take 1 tablet (100 mg total) by mouth at bedtime.       I have changed Mileidy Revels's traZODone. I am also having her maintain her Restasis, anastrozole, Chlor-Trimeton, albuterol, hydrOXYzine, Calcium Carb-Cholecalciferol, cholecalciferol, amLODipine, famotidine, cyanocobalamin, fluticasone furoate-vilanterol, Melatonin, L-Lysine, sodium chloride, Lubricant Eye Drops, hydrocortisone, magnesium oxide, potassium chloride, ondansetron, valACYclovir, rosuvastatin, diphenoxylate-atropine, raloxifene, and levothyroxine. We administered cyanocobalamin. We will continue to administer cyanocobalamin.  Allergies as of 04/15/2023       Reactions   Benadryl [diphenhydramine Hcl] Nausea Only   Dizziness   Penicillins    Had a rash around 20 years ago but unsure if it come from medicine.  Tolerated Cephalosporin Date: 10/09/20.        Medication List        Accurate as of Apr 15, 2023  9:19 PM. If you have any questions, ask your nurse or doctor.          albuterol 108 (90 Base) MCG/ACT inhaler Commonly known as: VENTOLIN HFA 2 puffs in lungs every 6 hours as needed for wheezing/short of breath.   amLODipine 5 MG tablet Commonly known as: NORVASC Take 1 tablet (5 mg total) by mouth daily.   anastrozole 1 MG tablet Commonly known as: ARIMIDEX Take 1 mg by mouth in the morning.   Calcium Carb-Cholecalciferol 600-20 MG-MCG Tabs Take 1 tablet by mouth in the morning.   Chlor-Trimeton 4 MG tablet Generic drug: chlorpheniramine Take 4 mg by mouth in the morning and at bedtime.   cholecalciferol 25 MCG (1000 UNIT) tablet Commonly known as: VITAMIN D3 Take 1,000 Units by mouth  in the morning.   cyanocobalamin 1000 MCG/ML injection Commonly known as: VITAMIN B12 Inject 1 mL (1,000 mcg total) into the skin every 30 (thirty) days.   diphenoxylate-atropine 2.5-0.025 MG tablet Commonly known as: Lomotil Take 1 tablet by mouth 4 (four) times daily as needed for diarrhea or loose stools.   famotidine 40 MG tablet Commonly known as: PEPCID Take 1 tablet (40 mg total) by mouth daily.   fluticasone furoate-vilanterol 200-25 MCG/ACT Aepb Commonly known as: Breo Ellipta Inhale 1 puff into the lungs daily. What changed:  when to take this reasons to take this   hydrocortisone 2.5 % cream Apply topically in the morning, at noon, in the evening, and at bedtime. Apply to your anus 4 times a day.  Apothecary hemorrhoid cream with nitroglycerin added   hydrOXYzine 25 MG tablet Commonly known as: ATARAX Take 25 mg by  mouth every 4 (four) hours as needed for itching.   L-Lysine 500 MG Tabs Take 500 mg by mouth at bedtime.   levothyroxine 50 MCG tablet Commonly known as: SYNTHROID Take 50 mcg by mouth daily before breakfast.   Lubricant Eye Drops 0.4-0.3 % Soln Generic drug: Polyethyl Glycol-Propyl Glycol Place 1-2 drops into both eyes 3 (three) times daily as needed (dry/irritated eyes.).   magnesium oxide 400 (240 Mg) MG tablet Commonly known as: MAG-OX Take 1 tablet (400 mg total) by mouth daily.   Melatonin 10 MG Caps Take 10 mg by mouth at bedtime.   ondansetron 4 MG tablet Commonly known as: ZOFRAN Take 4 mg by mouth every 8 (eight) hours as needed for vomiting or nausea.   potassium chloride 10 MEQ tablet Commonly known as: Klor-Con 10 Take 1 tablet (10 mEq total) by mouth daily.   raloxifene 60 MG tablet Commonly known as: EVISTA Take 1 tablet (60 mg total) by mouth daily.   Restasis 0.05 % ophthalmic emulsion Generic drug: cycloSPORINE Place 1 drop into both eyes 2 (two) times daily as needed (dry eyes).   rosuvastatin 20 MG  tablet Commonly known as: CRESTOR Take 1 tablet (20 mg total) by mouth daily.   sodium chloride 0.65 % Soln nasal spray Commonly known as: OCEAN Place 1 spray into both nostrils as needed for congestion.   traZODone 100 MG tablet Commonly known as: DESYREL Take 1 tablet (100 mg total) by mouth at bedtime.   valACYclovir 500 MG tablet Commonly known as: VALTREX Take 500 mg by mouth daily as needed (outbreaks).         Follow-up: Return in about 2 months (around 06/15/2023) for Hypothyroidism.  [name], M.D.

## 2023-04-16 LAB — CBC WITH DIFFERENTIAL/PLATELET
Basophils Absolute: 0 10*3/uL (ref 0.0–0.2)
Basos: 1 %
EOS (ABSOLUTE): 0.1 10*3/uL (ref 0.0–0.4)
Eos: 2 %
Hematocrit: 36.9 % (ref 34.0–46.6)
Hemoglobin: 11.8 g/dL (ref 11.1–15.9)
Immature Grans (Abs): 0 10*3/uL (ref 0.0–0.1)
Immature Granulocytes: 0 %
Lymphocytes Absolute: 1.3 10*3/uL (ref 0.7–3.1)
Lymphs: 27 %
MCH: 26.5 pg — ABNORMAL LOW (ref 26.6–33.0)
MCHC: 32 g/dL (ref 31.5–35.7)
MCV: 83 fL (ref 79–97)
Monocytes Absolute: 0.4 10*3/uL (ref 0.1–0.9)
Monocytes: 8 %
Neutrophils Absolute: 3 10*3/uL (ref 1.4–7.0)
Neutrophils: 62 %
Platelets: 260 10*3/uL (ref 150–450)
RBC: 4.46 x10E6/uL (ref 3.77–5.28)
RDW: 12.8 % (ref 11.7–15.4)
WBC: 4.9 10*3/uL (ref 3.4–10.8)

## 2023-04-16 LAB — IRON,TIBC AND FERRITIN PANEL
Ferritin: 102 ng/mL (ref 15–150)
Iron Saturation: 16 % (ref 15–55)
Iron: 51 ug/dL (ref 27–139)
Total Iron Binding Capacity: 325 ug/dL (ref 250–450)
UIBC: 274 ug/dL (ref 118–369)

## 2023-04-16 LAB — CMP14+EGFR
ALT: 12 IU/L (ref 0–32)
AST: 16 IU/L (ref 0–40)
Albumin/Globulin Ratio: 2.1 (ref 1.2–2.2)
Albumin: 4.7 g/dL (ref 3.8–4.8)
Alkaline Phosphatase: 73 IU/L (ref 44–121)
BUN/Creatinine Ratio: 14 (ref 12–28)
BUN: 21 mg/dL (ref 8–27)
Bilirubin Total: 0.5 mg/dL (ref 0.0–1.2)
CO2: 25 mmol/L (ref 20–29)
Calcium: 10.4 mg/dL — ABNORMAL HIGH (ref 8.7–10.3)
Chloride: 100 mmol/L (ref 96–106)
Creatinine, Ser: 1.45 mg/dL — ABNORMAL HIGH (ref 0.57–1.00)
Globulin, Total: 2.2 g/dL (ref 1.5–4.5)
Glucose: 102 mg/dL — ABNORMAL HIGH (ref 70–99)
Potassium: 4.7 mmol/L (ref 3.5–5.2)
Sodium: 139 mmol/L (ref 134–144)
Total Protein: 6.9 g/dL (ref 6.0–8.5)
eGFR: 37 mL/min/{1.73_m2} — ABNORMAL LOW (ref >59)

## 2023-04-16 LAB — LIPID PANEL
Chol/HDL Ratio: 3 ratio (ref 0.0–4.4)
Cholesterol, Total: 146 mg/dL (ref 100–199)
HDL: 48 mg/dL (ref >39)
LDL Chol Calc (NIH): 71 mg/dL (ref 0–99)
Triglycerides: 160 mg/dL — ABNORMAL HIGH (ref 0–149)
VLDL Cholesterol Cal: 27 mg/dL (ref 5–40)

## 2023-04-16 LAB — TSH+FREE T4
Free T4: 1.44 ng/dL (ref 0.82–1.77)
TSH: 4.14 u[IU]/mL (ref 0.450–4.500)

## 2023-04-16 LAB — VITAMIN B12: Vitamin B-12: 584 pg/mL (ref 232–1245)

## 2023-04-16 NOTE — Progress Notes (Signed)
Hello Paraskevi,  Your lab result is normal and/or stable.Some minor variations that are not significant are commonly marked abnormal, but do not represent any medical problem for you.  Best regards, Hrishikesh Hoeg, M.D.

## 2023-04-20 ENCOUNTER — Telehealth: Payer: Self-pay | Admitting: Family Medicine

## 2023-04-20 DIAGNOSIS — Z170 Estrogen receptor positive status [ER+]: Secondary | ICD-10-CM | POA: Diagnosis not present

## 2023-04-20 DIAGNOSIS — Z1231 Encounter for screening mammogram for malignant neoplasm of breast: Secondary | ICD-10-CM | POA: Diagnosis not present

## 2023-04-20 DIAGNOSIS — C50311 Malignant neoplasm of lower-inner quadrant of right female breast: Secondary | ICD-10-CM | POA: Diagnosis not present

## 2023-04-20 DIAGNOSIS — R92323 Mammographic fibroglandular density, bilateral breasts: Secondary | ICD-10-CM | POA: Diagnosis not present

## 2023-04-20 NOTE — Telephone Encounter (Signed)
Reviewed labs with patient. °

## 2023-04-24 ENCOUNTER — Ambulatory Visit (HOSPITAL_COMMUNITY)
Admission: RE | Admit: 2023-04-24 | Discharge: 2023-04-24 | Disposition: A | Discharge status: Home / Self Care | Payer: Medicare HMO | Source: Ambulatory Visit | Attending: Urology | Admitting: Urology

## 2023-04-24 DIAGNOSIS — C3490 Malignant neoplasm of unspecified part of unspecified bronchus or lung: Secondary | ICD-10-CM | POA: Diagnosis not present

## 2023-04-24 DIAGNOSIS — C3491 Malignant neoplasm of unspecified part of right bronchus or lung: Secondary | ICD-10-CM | POA: Diagnosis not present

## 2023-04-24 DIAGNOSIS — R918 Other nonspecific abnormal finding of lung field: Secondary | ICD-10-CM | POA: Diagnosis not present

## 2023-04-28 DIAGNOSIS — N6489 Other specified disorders of breast: Secondary | ICD-10-CM | POA: Diagnosis not present

## 2023-04-28 DIAGNOSIS — N6012 Diffuse cystic mastopathy of left breast: Secondary | ICD-10-CM | POA: Diagnosis not present

## 2023-04-28 DIAGNOSIS — C50311 Malignant neoplasm of lower-inner quadrant of right female breast: Secondary | ICD-10-CM | POA: Diagnosis not present

## 2023-04-28 DIAGNOSIS — Z170 Estrogen receptor positive status [ER+]: Secondary | ICD-10-CM | POA: Diagnosis not present

## 2023-04-28 DIAGNOSIS — R92322 Mammographic fibroglandular density, left breast: Secondary | ICD-10-CM | POA: Diagnosis not present

## 2023-04-29 ENCOUNTER — Ambulatory Visit
Admission: RE | Admit: 2023-04-29 | Discharge: 2023-04-29 | Disposition: A | Discharge status: Home / Self Care | Payer: Medicare HMO | Source: Ambulatory Visit | Attending: Urology | Admitting: Urology

## 2023-04-29 DIAGNOSIS — C3431 Malignant neoplasm of lower lobe, right bronchus or lung: Secondary | ICD-10-CM | POA: Diagnosis not present

## 2023-04-29 DIAGNOSIS — Z87891 Personal history of nicotine dependence: Secondary | ICD-10-CM | POA: Diagnosis not present

## 2023-04-29 NOTE — Progress Notes (Signed)
Telephone nursing appointment for patient to receive most recent scan results from 04/24/2023. I verified patient's identity x2 and began nursing interview.   Patient reports some mild, occasional SOB, concerns about changes reported within the LT breast, concerns over too many scans, x-rays etc, and could all this exposure have any negative side effects? Pt. denies any pain or discomfort at this time.   Meaningful use complete.   Patient aware of their 11:30am telephone appointment w/ Ashlyn Bruning PA-C. I left my extension [phone] in case patient needs anything. Patient verbalized understanding. This concludes the nursing interview.   Patient contact [phone]     Ruel Favors, LPN

## 2023-04-29 NOTE — Progress Notes (Signed)
Radiation Oncology         [phone] ________________________________  Name: Renee Maldonado MRN: [id number]  Date: 04/29/2023  DOB: 1946/02/10  Post Treatment Note  CC: [name], MD  [name], DO  Diagnosis:   77 y.o. female with NSCLC, adenocarcinoma of the right lower lobe lung, Stage IA.  Interval Since Last Radiation:  10 months  06/02/22 - 06/09/22:   The target in the RLL lung was treated to 54 Gy in 3 fractions of 18 Gy  Narrative:  I spoke with the patient to conduct her routine scheduled 3 month follow up visit to review results of her recent CT chest scan via telephone to spare the patient unnecessary potential exposure in the healthcare setting during the current COVID-19 pandemic.  The patient was notified in advance and gave permission to proceed with this visit format.  She tolerated radiation treatment relatively well with only mild fatigue.                              On review of systems, the patient states that she is doing well in general and remains without complaints. She specifically denies dysphagia, increased shortness of breath, productive cough, chest pain or hemoptysis. She reports a healthy appetite and is maintaining her weight. No recent fevers, chills or night sweats.  She had a recent post-treatment CT Chest scan on 04/24/23 that shows increased consolidation and ground-glass in the superior portion of the right lower lobe and adjacent right upper lobe, likely evolving postradiation change, and an unchanged appearance of the other lung nodules. No definite disease progression or recurrence.  She was also diagnosed with papillary thyroid carcinoma around the same time as her lung cancer diagnosis and was seen by Dr. Marene Lenz on 04/01/22 to discuss treatment. The recommendation was for surgical resection, likely hemithyroidectomy, to be scheduled after completing her lung treatments. She was quite anxious about the delay in treating the thyroid cancer  and was seen in consult with Dr. Ernestene Kiel on 08/11/22. Dr. Ernestene Kiel recommended right thyroid lobectomy with right central neck dissection vs total thyroidectomy pending discussion with endocrinologist, Dr. Kathreen Cosier. She met with Dr. Kathreen Cosier in Endocrinology at [facility] on 09/01/22 and the consensus recommendation was to proceed with a right thyroid lobectomy and should there be any worrisome signs intraoperatively for extracapsular extension or lymphadenopathy, surgery would be modified to include a right central neck dissection and or total thyroidectomy.  She had a right thyroid lobectomy with right central neck dissection on 10/10/22 and surgical pathology confirmed [id number] papillary thyroid carcinoma.  She is followed by Dr. Kathreen Cosier for surveillance labs and also continues in routine follow up with Dr. Nevin Bloodgood for her history of breast cancer. She did have an abnormal mammogram this month with changes noted in the left breast so she had additional imaging and U/S of the left breast 04/28/23 which showed fibrocystic changes only, BIRADS 3- likely benign. She will have follow up imaging of the left breast in 6 months for close observation.  ALLERGIES:  is allergic to benadryl [diphenhydramine hcl] and penicillins.  Meds: Current Outpatient Medications  Medication Sig Dispense Refill   albuterol (VENTOLIN HFA) 108 (90 Base) MCG/ACT inhaler 2 puffs in lungs every 6 hours as needed for wheezing/short of breath. 8.5 g 5   amLODipine (NORVASC) 5 MG tablet Take 1 tablet (5 mg total) by mouth daily. 90 tablet 3   anastrozole (ARIMIDEX) 1 MG tablet  Take 1 mg by mouth in the morning.     Calcium Carb-Cholecalciferol 600-20 MG-MCG TABS Take 1 tablet by mouth in the morning.     chlorpheniramine (CHLOR-TRIMETON) 4 MG tablet Take 4 mg by mouth in the morning and at bedtime.     cholecalciferol (VITAMIN D3) 25 MCG (1000 UNIT) tablet Take 1,000 Units by mouth in the morning.     cyanocobalamin (VITAMIN B12) 1000 MCG/ML injection  Inject 1 mL (1,000 mcg total) into the skin every 30 (thirty) days. 1 mL 11   diphenoxylate-atropine (LOMOTIL) 2.5-0.025 MG tablet Take 1 tablet by mouth 4 (four) times daily as needed for diarrhea or loose stools. 30 tablet 0   famotidine (PEPCID) 40 MG tablet Take 1 tablet (40 mg total) by mouth daily. 90 tablet 2   fluticasone furoate-vilanterol (BREO ELLIPTA) 200-25 MCG/ACT AEPB Inhale 1 puff into the lungs daily. (Patient taking differently: Inhale 1 puff into the lungs daily as needed (shortness of breath).) 1 each 11   hydrocortisone 2.5 % cream Apply topically in the morning, at noon, in the evening, and at bedtime. Apply to your anus 4 times a day.  Apothecary hemorrhoid cream with nitroglycerin added 30 g 0   hydrOXYzine (ATARAX/VISTARIL) 25 MG tablet Take 25 mg by mouth every 4 (four) hours as needed for itching.     L-Lysine 500 MG TABS Take 500 mg by mouth at bedtime.     levothyroxine (SYNTHROID) 50 MCG tablet Take 50 mcg by mouth daily before breakfast.     magnesium oxide (MAG-OX) 400 (240 Mg) MG tablet Take 1 tablet (400 mg total) by mouth daily. 90 tablet 3   Melatonin 10 MG CAPS Take 10 mg by mouth at bedtime.     ondansetron (ZOFRAN) 4 MG tablet Take 4 mg by mouth every 8 (eight) hours as needed for vomiting or nausea.     Polyethyl Glycol-Propyl Glycol (LUBRICANT EYE DROPS) 0.4-0.3 % SOLN Place 1-2 drops into both eyes 3 (three) times daily as needed (dry/irritated eyes.).     potassium chloride (KLOR-CON 10) 10 MEQ tablet Take 1 tablet (10 mEq total) by mouth daily. 180 tablet 3   raloxifene (EVISTA) 60 MG tablet Take 1 tablet (60 mg total) by mouth daily. 90 tablet 2   RESTASIS 0.05 % ophthalmic emulsion Place 1 drop into both eyes 2 (two) times daily as needed (dry eyes).      rosuvastatin (CRESTOR) 20 MG tablet Take 1 tablet (20 mg total) by mouth daily. 90 tablet 3   sodium chloride (OCEAN) 0.65 % SOLN nasal spray Place 1 spray into both nostrils as needed for congestion.      traZODone (DESYREL) 100 MG tablet Take 1 tablet (100 mg total) by mouth at bedtime. 90 tablet 3   valACYclovir (VALTREX) 500 MG tablet Take 500 mg by mouth daily as needed (outbreaks).     Current Facility-Administered Medications  Medication Dose Route Frequency Provider Last Rate Last Admin   cyanocobalamin (VITAMIN B12) injection 1,000 mcg  1,000 mcg Intramuscular Q30 days [name], MD   1,000 mcg at 04/15/23 1335    Physical Findings:  vitals were not taken for this visit.   /10 Unable to assess due to telephone follow up visit format.  Lab Findings: Lab Results  Component Value Date   WBC 4.9 04/15/2023   HGB 11.8 04/15/2023   HCT 36.9 04/15/2023   MCV 83 04/15/2023   PLT 260 04/15/2023     Radiographic Findings: CT  Chest Wo Contrast  Result Date: 04/28/2023 CLINICAL DATA:  Non-small cell lung cancer; * Tracking Code: BO * EXAM: CT CHEST WITHOUT CONTRAST TECHNIQUE: Multidetector CT imaging of the chest was performed following the standard protocol without IV contrast. RADIATION DOSE REDUCTION: This exam was performed according to the departmental dose-optimization program which includes automated exposure control, adjustment of the mA and/or kV according to patient size and/or use of iterative reconstruction technique. COMPARISON:  Chest CT dated September 09, 2022 FINDINGS: Cardiovascular: Normal heart size. Trace pericardial effusion. Normal caliber thoracic aorta with moderate calcified plaque. Mild coronary artery calcifications. Mediastinum/Nodes: Esophagus is unremarkable. Prior right thyroidectomy. No enlarged lymph nodes seen in the chest. Lungs/Pleura: Central airways are patent. Increased consolidation and ground-glass seen in the superior portion of the right lower lobe and adjacent right upper lobe. Stable part solid nodule of the right upper lobe apical nodule measuring 1.8 x 0.7 cm on series 5, image 29. Stable 9 mm ground-glass nodule of the right lower lobe  located on series 5, image 95. Stable 5 mm solid nodule of the left upper lobe located on image 55. No pleural effusion. Upper Abdomen: Partially visualized simple appearing cyst of the left kidney, no specific follow-up imaging is needed for this lesion Musculoskeletal: Stable mild compression deformities of T6 and T8. No aggressive appearing osseous lesions. IMPRESSION: 1. Increased consolidation and ground-glass in the superior portion of the right lower lobe and adjacent right upper lobe, possibly evolving postradiation change. Short-term follow-up chest CT is recommended 3 months to exclude progressive disease. 2. Stable part solid nodule of the right upper lobe, concerning for primary lung adenocarcinoma. Recommend attention on follow-up. 3. Additional small solid and ground-glass nodules are stable. 4. Aortic Atherosclerosis (ICD10-I70.0). Electronically Signed   By: Allegra Lai M.D.   On: 04/28/2023 09:37    Impression/Plan: 1. 77 y.o. female with newly diagnosed NSCLC, adenocarcinoma of the right lower lobe lung, Stage IA. She has recovered well from the effects of her SBRT lung treatment and remains without complaints. Her post-treatment CT Chest scan from 04/24/23 shows increased consolidation and ground-glass in the superior portion of the right lower lobe and adjacent right upper lobe, likely evolving postradiation change, and an unchanged appearance of the other lung nodules. No definite disease progression or recurrence. We will repeat a CT Chest in 4 months to confirm stability of post-treatment changes and if that scan is stable, we will then proceed with serial CT Chest scans every 6 months to continue to closely monitor for any evidence of disease recurrence or progression. I will call her to review the results of each scan but she knows that she is welcome to call at any time in the interim with questions or concerns.  She will continue her routine follow up with her endocrinologist, Dr.  Kathreen Cosier for labs s/p thyroidectomy and with Dr. Nevin Bloodgood for her history of breast cancer.   I personally spent 30 minutes in this encounter including chart review, reviewing radiological studies, telephone conversation with the patient, entering orders and completing documentation.     Marguarite Arbour, PA-C

## 2023-05-19 ENCOUNTER — Ambulatory Visit (INDEPENDENT_AMBULATORY_CARE_PROVIDER_SITE_OTHER): Payer: Medicare HMO

## 2023-05-19 DIAGNOSIS — E538 Deficiency of other specified B group vitamins: Secondary | ICD-10-CM

## 2023-05-19 NOTE — Progress Notes (Signed)
Cyanocobalamin injection given to right deltoid.  Patient tolerated well. 

## 2023-06-15 ENCOUNTER — Encounter: Payer: Self-pay | Admitting: Family Medicine

## 2023-06-15 ENCOUNTER — Ambulatory Visit (INDEPENDENT_AMBULATORY_CARE_PROVIDER_SITE_OTHER): Payer: Medicare HMO | Admitting: Family Medicine

## 2023-06-15 VITALS — BP 148/71 | HR 79 | Temp 97.7°F | Ht 61.0 in | Wt 172.4 lb

## 2023-06-15 DIAGNOSIS — E890 Postprocedural hypothyroidism: Secondary | ICD-10-CM

## 2023-06-15 DIAGNOSIS — I10 Essential (primary) hypertension: Secondary | ICD-10-CM

## 2023-06-15 LAB — CMP14+EGFR
Chloride: 101 mmol/L (ref 96–106)
Sodium: 138 mmol/L (ref 134–144)

## 2023-06-15 LAB — TSH+FREE T4

## 2023-06-15 NOTE — Progress Notes (Signed)
Subjective:  Patient ID: Renee Maldonado, female    DOB: 22-May-1946  Age: 77 y.o. MRN: [id number]  CC: Hypothyroidism   HPI Renee Maldonado presents for  presents for  follow-up of hypertension. Patient has no history of headache chest pain or shortness of breath or recent cough. Patient also denies symptoms of TIA such as focal numbness or weakness. Stopped HCTZ due to urinary frequency.  Having urinary frequency. Every 1-2 hours.    follow-up on  thyroid. The patient has a history of hypothyroidism for many years. It has been stable recently. Pt. denies any change in  voice, loss of hair, heat or cold intolerance. Energy level has been adequate to good. Patient denies constipation and diarrhea. No myxedema. Medication is as noted below. Verified that pt is taking it daily on an empty stomach. Well tolerated.      06/15/2023   10:20 AM 06/15/2023   10:09 AM 04/15/2023   10:14 AM  Depression screen PHQ 2/9  Decreased Interest 0 0 0  Down, Depressed, Hopeless 0 0 0  PHQ - 2 Score 0 0 0  Altered sleeping 0    Tired, decreased energy 1    Change in appetite 0    Feeling bad or failure about yourself  0    Trouble concentrating 0    Moving slowly or fidgety/restless 0    Suicidal thoughts 0    PHQ-9 Score 1    Difficult doing work/chores Not difficult at all      History Renee Maldonado has a past medical history of Anxiety, Arthritis, Cancer (HCC), COPD (chronic obstructive pulmonary disease) (HCC), GERD (gastroesophageal reflux disease), Humerus fracture (2007), Hyperlipidemia, Hypertension, Insomnia, Lung cancer (HCC), OA (osteoarthritis) of knee (10/08/2020), and Thyroid cancer (HCC) (2023).   She has a past surgical history that includes Total knee arthroplasty; Left shoulder surgery; Joint replacement; Tubal ligation (1978); Breast surgery (Right, 01/2019); Breast lumpectomy (Right, 02/2019); Total knee arthroplasty (Right, 10/08/2020); Bronchial biopsy (04/15/2022); Bronchial needle aspiration  biopsy (04/15/2022); Bronchial brushings (04/15/2022); Video bronchoscopy with radial endobronchial ultrasound (04/15/2022); Fiducial marker placement (04/15/2022); Colonoscopy (2022); Thyroidectomy (Right, 10/10/2022); Radical neck dissection (Right, 10/10/2022); and Esophagogastroduodenoscopy (egd) with propofol (N/A, 01/02/2023).   Her family history includes Alzheimer's disease in her mother; Cancer in her brother; Diabetes in her mother; Hypertension in her father; Stroke in her father.She reports that she quit smoking about a year ago. Her smoking use included cigarettes. She started smoking about 41 years ago. She has a 10 pack-year smoking history. She has never used smokeless tobacco. She reports current alcohol use of about 4.0 standard drinks of alcohol per week. She reports that she does not use drugs.    ROS Review of Systems  Constitutional: Negative.   HENT: Negative.    Eyes:  Negative for visual disturbance.  Respiratory:  Negative for shortness of breath.   Cardiovascular:  Negative for chest pain.  Gastrointestinal:  Negative for abdominal pain.  Musculoskeletal:  Negative for arthralgias.    Objective:  BP (!) 148/71   Pulse 79   Temp 97.7 F (36.5 C)   Ht 5\' 1"  (1.549 m)   Wt 172 lb 6.4 oz (78.2 kg)   SpO2 95%   BMI 32.57 kg/m   BP Readings from Last 3 Encounters:  06/15/23 (!) 148/71  04/15/23 (!) 173/86  01/30/23 122/70    Wt Readings from Last 3 Encounters:  06/15/23 172 lb 6.4 oz (78.2 kg)  04/15/23 169 lb 3.2 oz (76.7 kg)  01/30/23  172 lb 9.6 oz (78.3 kg)     Physical Exam Constitutional:      General: She is not in acute distress.    Appearance: She is well-developed.  Cardiovascular:     Rate and Rhythm: Normal rate and regular rhythm.  Pulmonary:     Breath sounds: Normal breath sounds.  Musculoskeletal:        General: Normal range of motion.  Skin:    General: Skin is warm and dry.  Neurological:     Mental Status: She is alert and  oriented to person, place, and time.       Assessment & Plan:   Renee Maldonado was seen today for hypothyroidism.  Diagnoses and all orders for this visit:  Primary hypertension -     CMP14+EGFR  Postoperative hypothyroidism -     TSH + free T4 -     CMP14+EGFR       I am having Renee Maldonado maintain her Restasis, anastrozole, Chlor-Trimeton, albuterol, hydrOXYzine, Calcium Carb-Cholecalciferol, cholecalciferol, amLODipine, famotidine, cyanocobalamin, fluticasone furoate-vilanterol, Melatonin, L-Lysine, sodium chloride, Lubricant Eye Drops, hydrocortisone, magnesium oxide, potassium chloride, ondansetron, valACYclovir, rosuvastatin, diphenoxylate-atropine, raloxifene, traZODone, levothyroxine, and hydrochlorothiazide. We will continue to administer cyanocobalamin.  Allergies as of 06/15/2023       Reactions   Benadryl [diphenhydramine Hcl] Nausea Only   Dizziness   Penicillins    Had a rash around 20 years ago but unsure if it come from medicine.  Tolerated Cephalosporin Date: 10/09/20.        Medication List        Accurate as of June 15, 2023 10:45 AM. If you have any questions, ask your nurse or doctor.          albuterol 108 (90 Base) MCG/ACT inhaler Commonly known as: VENTOLIN HFA 2 puffs in lungs every 6 hours as needed for wheezing/short of breath.   amLODipine 5 MG tablet Commonly known as: NORVASC Take 1 tablet (5 mg total) by mouth daily.   anastrozole 1 MG tablet Commonly known as: ARIMIDEX Take 1 mg by mouth in the morning.   Calcium Carb-Cholecalciferol 600-20 MG-MCG Tabs Take 1 tablet by mouth in the morning.   Chlor-Trimeton 4 MG tablet Generic drug: chlorpheniramine Take 4 mg by mouth in the morning and at bedtime.   cholecalciferol 25 MCG (1000 UNIT) tablet Commonly known as: VITAMIN D3 Take 1,000 Units by mouth in the morning.   cyanocobalamin 1000 MCG/ML injection Commonly known as: VITAMIN B12 Inject 1 mL (1,000 mcg total) into the  skin every 30 (thirty) days.   diphenoxylate-atropine 2.5-0.025 MG tablet Commonly known as: Lomotil Take 1 tablet by mouth 4 (four) times daily as needed for diarrhea or loose stools.   famotidine 40 MG tablet Commonly known as: PEPCID Take 1 tablet (40 mg total) by mouth daily.   fluticasone furoate-vilanterol 200-25 MCG/ACT Aepb Commonly known as: Breo Ellipta Inhale 1 puff into the lungs daily. What changed:  when to take this reasons to take this   hydrochlorothiazide 25 MG tablet Commonly known as: HYDRODIURIL Take 25 mg by mouth daily.   hydrocortisone 2.5 % cream Apply topically in the morning, at noon, in the evening, and at bedtime. Apply to your anus 4 times a day.  Apothecary hemorrhoid cream with nitroglycerin added   hydrOXYzine 25 MG tablet Commonly known as: ATARAX Take 25 mg by mouth every 4 (four) hours as needed for itching.   L-Lysine 500 MG Tabs Take 500 mg by mouth at bedtime.  levothyroxine 50 MCG tablet Commonly known as: SYNTHROID Take 50 mcg by mouth daily before breakfast.   Lubricant Eye Drops 0.4-0.3 % Soln Generic drug: Polyethyl Glycol-Propyl Glycol Place 1-2 drops into both eyes 3 (three) times daily as needed (dry/irritated eyes.).   magnesium oxide 400 (240 Mg) MG tablet Commonly known as: MAG-OX Take 1 tablet (400 mg total) by mouth daily.   Melatonin 10 MG Caps Take 10 mg by mouth at bedtime.   ondansetron 4 MG tablet Commonly known as: ZOFRAN Take 4 mg by mouth every 8 (eight) hours as needed for vomiting or nausea.   potassium chloride 10 MEQ tablet Commonly known as: Klor-Con 10 Take 1 tablet (10 mEq total) by mouth daily.   raloxifene 60 MG tablet Commonly known as: EVISTA Take 1 tablet (60 mg total) by mouth daily.   Restasis 0.05 % ophthalmic emulsion Generic drug: cycloSPORINE Place 1 drop into both eyes 2 (two) times daily as needed (dry eyes).   rosuvastatin 20 MG tablet Commonly known as: CRESTOR Take 1  tablet (20 mg total) by mouth daily.   sodium chloride 0.65 % Soln nasal spray Commonly known as: OCEAN Place 1 spray into both nostrils as needed for congestion.   traZODone 100 MG tablet Commonly known as: DESYREL Take 1 tablet (100 mg total) by mouth at bedtime.   valACYclovir 500 MG tablet Commonly known as: VALTREX Take 500 mg by mouth daily as needed (outbreaks).         Follow-up: Return in about 3 months (around 09/15/2023).  [name], M.D.

## 2023-06-16 ENCOUNTER — Other Ambulatory Visit: Payer: Self-pay | Admitting: Family Medicine

## 2023-06-16 MED ORDER — LEVOTHYROXINE SODIUM 75 MCG PO TABS: 75.0000 ug | ORAL_TABLET | Freq: Every day | ORAL | 1 refills | Status: DC

## 2023-06-17 ENCOUNTER — Other Ambulatory Visit: Payer: Self-pay | Admitting: *Deleted

## 2023-06-17 DIAGNOSIS — E890 Postprocedural hypothyroidism: Secondary | ICD-10-CM

## 2023-06-22 ENCOUNTER — Ambulatory Visit (INDEPENDENT_AMBULATORY_CARE_PROVIDER_SITE_OTHER): Payer: Medicare HMO

## 2023-06-22 DIAGNOSIS — E538 Deficiency of other specified B group vitamins: Secondary | ICD-10-CM

## 2023-06-22 NOTE — Progress Notes (Signed)
Cyanocobalamin injection given to left deltoid.  Patient tolerated well. 

## 2023-06-25 ENCOUNTER — Encounter: Payer: Self-pay | Admitting: Gastroenterology

## 2023-06-25 ENCOUNTER — Ambulatory Visit: Payer: Medicare HMO | Admitting: Gastroenterology

## 2023-06-25 ENCOUNTER — Telehealth: Payer: Self-pay | Admitting: *Deleted

## 2023-06-25 ENCOUNTER — Other Ambulatory Visit: Payer: Self-pay | Admitting: *Deleted

## 2023-06-25 VITALS — BP 134/65 | HR 77 | Temp 97.8°F | Ht 61.0 in | Wt 176.0 lb

## 2023-06-25 DIAGNOSIS — K7460 Unspecified cirrhosis of liver: Secondary | ICD-10-CM

## 2023-06-25 NOTE — Telephone Encounter (Signed)
[facility]  [place] scheduled for Thursday, 07/09/23, arrive at 7:15 am to check in , NPO after midnight.

## 2023-06-25 NOTE — Patient Instructions (Addendum)
Please have blood work completed at [facility].  We will call you with results once they have been received. Please allow 3-5 business days for review. 2 locations for [facility] in [place]:              1. [address], [place]              2. [address] [place], [place]   You do not have to go get your blood work today but you can do this anytime within the next several weeks.  We will get you scheduled for an ultrasound of your liver in the near future.  Cirrhosis Lifestyle Recommendations:  High-protein diet from a primarily plant-based diet. Avoid red meat.  No raw or undercooked meat, seafood, or shellfish. Low-fat/cholesterol/carbohydrate diet. Limit sodium to no more than 2000 mg/day including everything that you eat and drink. Recommend at least 30 minutes of aerobic and resistance exercise 3 days/week. Limit Tylenol to 2000 mg daily.   It was a pleasure to see you again today!  We will plan to follow-up in 6 months, sooner if needed.  Please let me know if you develop any confusion, increased swelling in your feet or legs, or swelling that develops in your abdomen.  I want to create trusting relationships with patients. If you receive a survey regarding your visit,  I greatly appreciate you taking time to fill this out on paper or through your MyChart. I value your feedback.  Brooke Bonito, MSN, FNP-BC, AGACNP-BC [facility]

## 2023-06-25 NOTE — Progress Notes (Signed)
GI Office Note    Referring Provider: [name], MD Primary Care Physician:  [name], MD Primary Gastroenterologist: Hennie Duos. Marletta Lor, DO  Date:  06/25/2023  ID:  Renee Maldonado, DOB 11-16-1946, MRN [id number]   Chief Complaint   Chief Complaint  Patient presents with   Follow-up    Follow up. No problems    History of Present Illness  Renee Maldonado is a 77 y.o. female with a history of anxiety, breast cancer, arthritis, COPD, CKD, GERD, HLD, HTN, OA, thyroid cancer, lung cancer, and recently diagnosed cirrhosis presenting today for cirrhosis follow-up.  Last colonoscopy April 2022 at [facility]: Unable to review report. (Patient has previously stated she would not have another colonoscopy)  Admitted to hospital December 2023 with diarrhea x 1 week.  CT with findings consistent with ileus/enteritis, cirrhosis with mild steatosis, and 1 cm right lower lobe groundglass nodule, small hiatal hernia, and small inguinal fat hernia.  Stools positive for adenovirus and she was treated supportively with Imodium and cholestyramine.  Developed rectal pain during admission with concern for perianal abscess.  General surgery was consulted and found to have perianal ulceration at 6 o'clock position with recommendations for supportive treatment with Anusol suppositories and hemorrhoid cream compounded with nitroglycerin on discharge.  Seen by general surgery at the end of January and reported improvement in rectal pain with compounded hemorrhoid cream.  Per notes her exam noted that her ulceration had almost completely healed.  Last office visit 12/18/2022.  Denied any jaundice, pruritus, bleeding, encephalopathy or ascites.  Did report 2 episodes of scant toilet tissue hematochezia.  Reportedly drinking 4 ounces of liquor per day when she drinks.  May drink daily for a week and then nothing for months.  Quit smoking in August/September 2023.  MELD labs as well as other labs ordered to assess for  etiology of cirrhosis.  Right upper quadrant ultrasound advised for June 2024.  Scheduled for EGD.  Labs in February negative for hepatitis A, B, or C.  No immunity to hepatitis A or B.  AFP within normal limits.  Negative ANA, ceruloplasmin, ASMA, AMA, ANA.  Ferritin mildly elevated at 183.  No elevation in immunoglobulins.  Today: Cirrhosis history Hematemesis/coffee ground emesis: none Abdominal pain: none Abdominal distention/worsening ascites: none Fever/chills: none Encephalopathy: none Number of daily bowel movements: 3-4 Taking diuretics?: HCTZ Date of last EGD: 01/02/23 - no varices, gastritis, normal duodenum, repeat in 3 years History of variceal bleeding: none Prior history of banding?: none Prior episodes of SBP: none Last time liver imaging was performed:December 2023  Hepatitis A and B vaccination status: recommended previously   MELD Na score: 12 (labs 12/22/22)  For lunch she had green beans and okra with her fish and limited her hush puppies.  Has had a hard time completely cutting out carbs but is still working on trying to make better choices. Denies N/V, lack of appetite, weight loss.    Current Outpatient Medications  Medication Sig Dispense Refill   albuterol (VENTOLIN HFA) 108 (90 Base) MCG/ACT inhaler 2 puffs in lungs every 6 hours as needed for wheezing/short of breath. 8.5 g 5   amLODipine (NORVASC) 5 MG tablet Take 1 tablet (5 mg total) by mouth daily. 90 tablet 3   anastrozole (ARIMIDEX) 1 MG tablet Take 1 mg by mouth in the morning.     Calcium Carb-Cholecalciferol 600-20 MG-MCG TABS Take 1 tablet by mouth in the morning.     chlorpheniramine (CHLOR-TRIMETON) 4 MG tablet Take 4  mg by mouth in the morning and at bedtime.     cholecalciferol (VITAMIN D3) 25 MCG (1000 UNIT) tablet Take 1,000 Units by mouth in the morning.     cyanocobalamin (VITAMIN B12) 1000 MCG/ML injection Inject 1 mL (1,000 mcg total) into the skin every 30 (thirty) days. 1 mL 11    famotidine (PEPCID) 40 MG tablet Take 1 tablet (40 mg total) by mouth daily. 90 tablet 2   fluticasone furoate-vilanterol (BREO ELLIPTA) 200-25 MCG/ACT AEPB Inhale 1 puff into the lungs daily. (Patient taking differently: Inhale 1 puff into the lungs daily as needed (shortness of breath).) 1 each 11   hydrOXYzine (ATARAX/VISTARIL) 25 MG tablet Take 25 mg by mouth every 4 (four) hours as needed for itching.     L-Lysine 500 MG TABS Take 500 mg by mouth at bedtime.     levothyroxine (SYNTHROID) 75 MCG tablet Take 1 tablet (75 mcg total) by mouth daily before breakfast. 90 tablet 1   magnesium oxide (MAG-OX) 400 (240 Mg) MG tablet Take 1 tablet (400 mg total) by mouth daily. 90 tablet 3   Melatonin 10 MG CAPS Take 10 mg by mouth at bedtime.     ondansetron (ZOFRAN) 4 MG tablet Take 4 mg by mouth every 8 (eight) hours as needed for vomiting or nausea.     Polyethyl Glycol-Propyl Glycol (LUBRICANT EYE DROPS) 0.4-0.3 % SOLN Place 1-2 drops into both eyes 3 (three) times daily as needed (dry/irritated eyes.).     raloxifene (EVISTA) 60 MG tablet Take 1 tablet (60 mg total) by mouth daily. 90 tablet 2   RESTASIS 0.05 % ophthalmic emulsion Place 1 drop into both eyes 2 (two) times daily as needed (dry eyes).      rosuvastatin (CRESTOR) 20 MG tablet Take 1 tablet (20 mg total) by mouth daily. 90 tablet 3   sodium chloride (OCEAN) 0.65 % SOLN nasal spray Place 1 spray into both nostrils as needed for congestion.     traZODone (DESYREL) 100 MG tablet Take 1 tablet (100 mg total) by mouth at bedtime. 90 tablet 3   valACYclovir (VALTREX) 500 MG tablet Take 500 mg by mouth daily as needed (outbreaks).     diphenoxylate-atropine (LOMOTIL) 2.5-0.025 MG tablet Take 1 tablet by mouth 4 (four) times daily as needed for diarrhea or loose stools. (Patient not taking: Reported on 06/25/2023) 30 tablet 0   hydrochlorothiazide (HYDRODIURIL) 25 MG tablet Take 25 mg by mouth daily. (Patient not taking: Reported on 06/15/2023)      hydrocortisone 2.5 % cream Apply topically in the morning, at noon, in the evening, and at bedtime. Apply to your anus 4 times a day.  Apothecary hemorrhoid cream with nitroglycerin added (Patient not taking: Reported on 06/25/2023) 30 g 0   potassium chloride (KLOR-CON 10) 10 MEQ tablet Take 1 tablet (10 mEq total) by mouth daily. (Patient not taking: Reported on 06/25/2023) 180 tablet 3   Current Facility-Administered Medications  Medication Dose Route Frequency Provider Last Rate Last Admin   cyanocobalamin (VITAMIN B12) injection 1,000 mcg  1,000 mcg Intramuscular Q30 days [name], MD   1,000 mcg at 06/22/23 1008    Past Medical History:  Diagnosis Date   Anxiety    Arthritis    Cancer [facility])    breast Right no chemo or radiation  on Arimidex   COPD (chronic obstructive pulmonary disease) (HCC)    on Breo   GERD (gastroesophageal reflux disease)    Humerus fracture 2007  Hyperlipidemia    Hypertension    Insomnia    Lung cancer (HCC)    OA (osteoarthritis) of knee 10/08/2020   Thyroid cancer (HCC) 2023    Past Surgical History:  Procedure Laterality Date   BREAST LUMPECTOMY Right 02/2019   BREAST SURGERY Right 01/2019   breast biopsy   BRONCHIAL BIOPSY  04/15/2022   Procedure: BRONCHIAL BIOPSIES;  Surgeon: [name], DO;  Location: [facility];  Service: Pulmonary;;   BRONCHIAL BRUSHINGS  04/15/2022   Procedure: BRONCHIAL BRUSHINGS;  Surgeon: [name], DO;  Location: [facility];  Service: Pulmonary;;   BRONCHIAL NEEDLE ASPIRATION BIOPSY  04/15/2022   Procedure: BRONCHIAL NEEDLE ASPIRATION BIOPSIES;  Surgeon: [name], DO;  Location: [facility];  Service: Pulmonary;;   COLONOSCOPY  2022   ESOPHAGOGASTRODUODENOSCOPY (EGD) WITH PROPOFOL N/A 01/02/2023   Procedure: ESOPHAGOGASTRODUODENOSCOPY (EGD) WITH PROPOFOL;  Surgeon: Lanelle Bal, DO;  Location: [facility];  Service: Endoscopy;  Laterality: N/A;  12:45 pm, pt can't come earlier    FIDUCIAL MARKER PLACEMENT  04/15/2022   Procedure: FIDUCIAL MARKER PLACEMENT;  Surgeon: [name], DO;  Location: [facility];  Service: Pulmonary;;   JOINT REPLACEMENT     left knee   Left shoulder surgery     2007   RADICAL NECK DISSECTION Right 10/10/2022   Procedure: CENTRAL NECK DISSECTION;  Surgeon: Scarlette Ar, MD;  Location: [facility] OR;  Service: ENT;  Laterality: Right;   THYROIDECTOMY Right 10/10/2022   Procedure: RIGHT THYROID LOBECTOMY;  Surgeon: Scarlette Ar, MD;  Location: [facility] OR;  Service: ENT;  Laterality: Right;   TOTAL KNEE ARTHROPLASTY     2012 left   TOTAL KNEE ARTHROPLASTY Right 10/08/2020   Procedure: TOTAL KNEE ARTHROPLASTY;  Surgeon: Ollen Gross, MD;  Location: [facility] ORS;  Service: Orthopedics;  Laterality: Right;   TUBAL LIGATION  1978   VIDEO BRONCHOSCOPY WITH RADIAL ENDOBRONCHIAL ULTRASOUND  04/15/2022   Procedure: VIDEO BRONCHOSCOPY WITH RADIAL ENDOBRONCHIAL ULTRASOUND;  Surgeon: [name], DO;  Location: [facility];  Service: Pulmonary;;    Family History  Problem Relation Age of Onset   Alzheimer's disease Mother    Diabetes Mother    Stroke Father    Hypertension Father    Cancer Brother    Liver disease Neg Hx     Allergies as of 06/25/2023 - Review Complete 06/25/2023  Allergen Reaction Noted   Benadryl [diphenhydramine hcl] Nausea Only 06/29/2013   Penicillins  06/29/2013    Social History   Socioeconomic History   Marital status: Married    Spouse name: Windy Fast   Number of children: 3   Years of education: 16   Highest education level: Bachelor's degree (e.g., BA, AB, BS)  Occupational History   Occupation: retired  Tobacco Use   Smoking status: Former    Current packs/day: 0.00    Average packs/day: 0.3 packs/day for 40.0 years (10.0 ttl pk-yrs)    Types: Cigarettes    Start date: 06/17/1982    Quit date: 06/17/2022    Years since quitting: 1.0   Smokeless tobacco: Never   Tobacco comments:    smokes 3 a day   Vaping Use   Vaping status: Former   Start date: 07/18/2018   Quit date: 07/19/2019  Substance and Sexual Activity   Alcohol use: Yes    Alcohol/week: 4.0 standard drinks of alcohol    Types: 4 Standard drinks or equivalent per week    Comment: occasional, up to 4oz of  vodka or whiskey per day for a week, then nothing for months.   Drug use: Never   Sexual activity: Not Currently  Other Topics Concern   Not on file  Social History Narrative   Lives home with her husband. Enjoys travelling, playing bingo, exercising at [employer]creation center, eats out a lot   Social Determinants of Health   Financial Resource Strain: Low Risk  (11/18/2022)   Overall Financial Resource Strain (CARDIA)    Difficulty of Paying Living Expenses: Not hard at all  Food Insecurity: No Food Insecurity (02/03/2023)   Hunger Vital Sign    Worried About Running Out of Food in the Last Year: Never true    Ran Out of Food in the Last Year: Never true  Transportation Needs: No Transportation Needs (02/03/2023)   PRAPARE - Administrator, Civil Service (Medical): No    Lack of Transportation (Non-Medical): No  Physical Activity: Insufficiently Active (08/02/2021)   Exercise Vital Sign    Days of Exercise per Week: 4 days    Minutes of Exercise per Session: 30 min  Stress: No Stress Concern Present (08/02/2021)   [employer] of Occupational Health - Occupational Stress Questionnaire    Feeling of Stress : Not at all  Social Connections: Unknown (03/18/2022)   Received from [facility]   Social Network    Social Network: Not on file     Review of Systems   Gen: Denies fever, chills, anorexia. Denies fatigue, weakness, weight loss.  CV: Denies chest pain, palpitations, syncope, peripheral edema, and claudication. Resp: Denies dyspnea at rest, cough, wheezing, coughing up blood, and pleurisy. GI: See HPI Derm: Denies rash, itching, dry skin Psych: Denies depression, anxiety, memory  loss, confusion. No homicidal or suicidal ideation.  Heme: Denies bruising, bleeding, and enlarged lymph nodes.   Physical Exam   BP 134/65 (BP Location: Right Arm, Patient Position: Sitting, Cuff Size: Large)   Pulse 77   Temp 97.8 F (36.6 C) (Temporal)   Ht 5\' 1"  (1.549 m)   Wt 176 lb (79.8 kg)   SpO2 96%   BMI 33.25 kg/m   General:   Alert and oriented. No distress noted. Pleasant and cooperative.  Head:  Normocephalic and atraumatic. Eyes:  Conjuctiva clear without scleral icterus. Mouth:  Oral mucosa pink and moist. Good dentition. No lesions. Lungs:  Clear to auscultation bilaterally. No wheezes, rales, or rhonchi. No distress.  Heart:  S1, S2 present without murmurs appreciated.  Abdomen:  +BS, soft, non-tender and non-distended. No rebound or guarding. No HSM or masses noted. Rectal: deferred Msk:  Symmetrical without gross deformities. Normal posture. Extremities: Nonpitting edema bilaterally. Neurologic:  Alert and  oriented x4 Psych:  Alert and cooperative. Normal mood and affect.   Assessment  Renee Maldonado is a 77 y.o. female with a history of anxiety, breast cancer, arthritis, COPD, CKD, GERD, HLD, HTN, OA, thyroid cancer, lung cancer, and recently diagnosed cirrhosis presenting today for cirrhosis follow-up.  Cirrhosis: Recently diagnosed in December 2023 as incidentally found on CT during hospitalization.  Workup thus far has been negative for viral and autoimmune serologies.  Also ruled out with symptoms and unlikely hemochromatosis.  Etiology likely secondary to NAFLD and EtOH.  Not currently drinking heavily, only small amounts on special occasions.  Most recent MELD score 12 on labs in February, currently due for MELD labs now.  Given she has not had a recent CMP will check CBC and INR.  No history of  thrombocytopenia.  EGD recent completed without evidence of varices.  Currently due for hepatoma screening.  She remains well compensated at this point.  We  discussed potential progression of cirrhosis, dietary recommendations, and ongoing surveillance needed and the importance of regular follow-up.  Discussed signs/symptoms to be aware of as well.  PLAN   CBC, INR RUQ [place] Avoid alcohol and NSAIDs High protein diet 2g sodium diet No more than 2g Tylenol daily Monitor for worsening swelling Follow up in 6 months     Brooke Bonito, MSN, FNP-BC, AGACNP-BC [facility]

## 2023-06-25 NOTE — Telephone Encounter (Signed)
Pt informed of [place] appt date, time and instructions.

## 2023-06-26 ENCOUNTER — Encounter: Payer: Self-pay | Admitting: *Deleted

## 2023-07-09 ENCOUNTER — Ambulatory Visit (HOSPITAL_COMMUNITY)
Admission: RE | Admit: 2023-07-09 | Discharge: 2023-07-09 | Disposition: A | Discharge status: Home / Self Care | Payer: Medicare HMO | Source: Ambulatory Visit | Attending: Gastroenterology | Admitting: Gastroenterology

## 2023-07-09 DIAGNOSIS — K7460 Unspecified cirrhosis of liver: Secondary | ICD-10-CM | POA: Insufficient documentation

## 2023-07-10 ENCOUNTER — Ambulatory Visit: Payer: Medicare HMO

## 2023-07-13 ENCOUNTER — Ambulatory Visit: Payer: Medicare HMO

## 2023-07-15 ENCOUNTER — Ambulatory Visit (INDEPENDENT_AMBULATORY_CARE_PROVIDER_SITE_OTHER): Payer: Medicare HMO

## 2023-07-15 DIAGNOSIS — Z23 Encounter for immunization: Secondary | ICD-10-CM

## 2023-07-21 ENCOUNTER — Encounter: Payer: Self-pay | Admitting: *Deleted

## 2023-07-23 ENCOUNTER — Telehealth: Payer: Self-pay | Admitting: Urology

## 2023-07-23 ENCOUNTER — Ambulatory Visit (INDEPENDENT_AMBULATORY_CARE_PROVIDER_SITE_OTHER): Payer: Medicare HMO

## 2023-07-23 DIAGNOSIS — E538 Deficiency of other specified B group vitamins: Secondary | ICD-10-CM | POA: Diagnosis not present

## 2023-07-23 NOTE — Progress Notes (Signed)
Cyanocobalamin injection given to right deltoid.  Patient tolerated well. 

## 2023-07-23 NOTE — Telephone Encounter (Signed)
Pt called 9/5 to r/s 10/9 telephone f/u due to vacation. Pt was r/s to 10/16 for f/u

## 2023-07-27 ENCOUNTER — Other Ambulatory Visit: Payer: Self-pay | Admitting: Family Medicine

## 2023-08-03 ENCOUNTER — Other Ambulatory Visit: Payer: Medicare HMO

## 2023-08-03 DIAGNOSIS — E890 Postprocedural hypothyroidism: Secondary | ICD-10-CM | POA: Diagnosis not present

## 2023-08-04 ENCOUNTER — Telehealth: Payer: Self-pay | Admitting: *Deleted

## 2023-08-04 LAB — TSH+FREE T4
Free T4: 1.54 ng/dL (ref 0.82–1.77)
TSH: 2.54 u[IU]/mL (ref 0.450–4.500)

## 2023-08-04 NOTE — Telephone Encounter (Signed)
Called patient to inform of CT for 08-19-23- arrival time- 4:15 pm , no restrictions to scan, patient to be called by Marcello Fennel on on  09-02-23 @ 8:30 am with the results, spoke with patient and she is aware of these appts. and the instructions

## 2023-08-05 ENCOUNTER — Ambulatory Visit (INDEPENDENT_AMBULATORY_CARE_PROVIDER_SITE_OTHER): Payer: Medicare HMO | Admitting: Family Medicine

## 2023-08-05 ENCOUNTER — Other Ambulatory Visit: Payer: Self-pay | Admitting: Family Medicine

## 2023-08-05 ENCOUNTER — Encounter: Payer: Self-pay | Admitting: Family Medicine

## 2023-08-05 ENCOUNTER — Encounter: Payer: Self-pay | Admitting: *Deleted

## 2023-08-05 ENCOUNTER — Ambulatory Visit (INDEPENDENT_AMBULATORY_CARE_PROVIDER_SITE_OTHER): Payer: Medicare HMO

## 2023-08-05 VITALS — BP 125/65 | HR 71 | Temp 97.8°F | Ht 61.0 in | Wt 176.2 lb

## 2023-08-05 DIAGNOSIS — Z23 Encounter for immunization: Secondary | ICD-10-CM | POA: Diagnosis not present

## 2023-08-05 DIAGNOSIS — Z780 Asymptomatic menopausal state: Secondary | ICD-10-CM | POA: Diagnosis not present

## 2023-08-05 DIAGNOSIS — M8588 Other specified disorders of bone density and structure, other site: Secondary | ICD-10-CM

## 2023-08-05 DIAGNOSIS — I10 Essential (primary) hypertension: Secondary | ICD-10-CM

## 2023-08-05 DIAGNOSIS — E890 Postprocedural hypothyroidism: Secondary | ICD-10-CM | POA: Diagnosis not present

## 2023-08-05 NOTE — Progress Notes (Signed)
Subjective:  Patient ID: Renee Maldonado, female    DOB: [birth year]  Age: 77 y.o. MRN: [id number]  CC: Hypothyroidism   HPI Orine Badder presents for recheck of thyroid. Testing attached, in normal range now from lab done yesterday. Trying to separate calcium intake from med by 4 hours. Plus, recently her levothyroxine dose was increased. Doing well with energy.    presents for  follow-up of hypertension. Patient has no history of headache chest pain or shortness of breath or recent cough. Patient also denies symptoms of TIA such as focal numbness or weakness. Patient denies side effects from medication. States taking it regularly.  Getting Dexa today. Continues to take calcium, Vit. D & raloxifene for her osteopenia. Due for updated scan.      08/05/2023   11:19 AM 06/15/2023   10:20 AM 06/15/2023   10:09 AM  Depression screen PHQ 2/9  Decreased Interest 0 0 0  Down, Depressed, Hopeless 0 0 0  PHQ - 2 Score 0 0 0  Altered sleeping  0   Tired, decreased energy  1   Change in appetite  0   Feeling bad or failure about yourself   0   Trouble concentrating  0   Moving slowly or fidgety/restless  0   Suicidal thoughts  0   PHQ-9 Score  1   Difficult doing work/chores  Not difficult at all     History Dannie has a past medical history of Anxiety, Arthritis, Cancer (HCC), COPD (chronic obstructive pulmonary disease) (HCC), GERD (gastroesophageal reflux disease), Humerus fracture (2007), Hyperlipidemia, Hypertension, Insomnia, Lung cancer (HCC), OA (osteoarthritis) of knee (10/08/2020), and Thyroid cancer (HCC) (2023).   She has a past surgical history that includes Total knee arthroplasty; Left shoulder surgery; Joint replacement; Tubal ligation (1978); Breast surgery (Right, 01/2019); Breast lumpectomy (Right, 02/2019); Total knee arthroplasty (Right, 10/08/2020); Bronchial biopsy (04/15/2022); Bronchial needle aspiration biopsy (04/15/2022); Bronchial brushings (04/15/2022); Video  bronchoscopy with radial endobronchial ultrasound (04/15/2022); Fiducial marker placement (04/15/2022); Colonoscopy (2022); Thyroidectomy (Right, 10/10/2022); Radical neck dissection (Right, 10/10/2022); and Esophagogastroduodenoscopy (egd) with propofol (N/A, 01/02/2023).   Her family history includes Alzheimer's disease in her mother; Cancer in her brother; Diabetes in her mother; Hypertension in her father; Stroke in her father.She reports that she quit smoking about 13 months ago. Her smoking use included cigarettes. She started smoking about 41 years ago. She has a 10 pack-year smoking history. She has never used smokeless tobacco. She reports current alcohol use of about 4.0 standard drinks of alcohol per week. She reports that she does not use drugs.    ROS Review of Systems  Constitutional: Negative.   HENT: Negative.    Eyes:  Negative for visual disturbance.  Respiratory:  Negative for shortness of breath.   Cardiovascular:  Negative for chest pain.  Gastrointestinal:  Negative for abdominal pain.  Musculoskeletal:  Negative for arthralgias.    Objective:  BP 125/65   Pulse 71   Temp 97.8 F (36.6 C)   Ht 5\' 1"  (1.549 m)   Wt 176 lb 3.2 oz (79.9 kg)   SpO2 93%   BMI 33.29 kg/m   BP Readings from Last 3 Encounters:  08/05/23 125/65  06/25/23 134/65  06/15/23 (!) 148/71    Wt Readings from Last 3 Encounters:  08/05/23 176 lb 3.2 oz (79.9 kg)  06/25/23 176 lb (79.8 kg)  06/15/23 172 lb 6.4 oz (78.2 kg)     Physical Exam Constitutional:      General: She  is not in acute distress.    Appearance: She is well-developed.  Cardiovascular:     Rate and Rhythm: Normal rate and regular rhythm.  Pulmonary:     Breath sounds: Normal breath sounds.  Musculoskeletal:        General: Normal range of motion.  Skin:    General: Skin is warm and dry.  Neurological:     Mental Status: She is alert and oriented to person, place, and time.       Assessment & Plan:    Donalene was seen today for hypothyroidism.  Diagnoses and all orders for this visit:  Encounter for immunization -     Flu Vaccine Trivalent High Dose (Fluad)  Primary hypertension  Osteopenia of lumbar spine  Postoperative hypothyroidism  DEXA result pending  I am having Alora Avey maintain her Restasis, anastrozole, Chlor-Trimeton, albuterol, hydrOXYzine, Calcium Carb-Cholecalciferol, cholecalciferol, amLODipine, cyanocobalamin, fluticasone furoate-vilanterol, Melatonin, L-Lysine, sodium chloride, Lubricant Eye Drops, magnesium oxide, potassium chloride, ondansetron, valACYclovir, rosuvastatin, diphenoxylate-atropine, raloxifene, traZODone, hydrochlorothiazide, levothyroxine, and famotidine. We will continue to administer cyanocobalamin.  Allergies as of 08/05/2023       Reactions   Benadryl [diphenhydramine Hcl] Nausea Only   Dizziness   Penicillins    Had a rash around 20 years ago but unsure if it come from medicine.  Tolerated Cephalosporin Date: 10/09/20.        Medication List        Accurate as of August 05, 2023  5:05 PM. If you have any questions, ask your nurse or doctor.          albuterol 108 (90 Base) MCG/ACT inhaler Commonly known as: VENTOLIN HFA 2 puffs in lungs every 6 hours as needed for wheezing/short of breath.   amLODipine 5 MG tablet Commonly known as: NORVASC Take 1 tablet (5 mg total) by mouth daily.   anastrozole 1 MG tablet Commonly known as: ARIMIDEX Take 1 mg by mouth in the morning.   Calcium Carb-Cholecalciferol 600-20 MG-MCG Tabs Take 1 tablet by mouth in the morning.   Chlor-Trimeton 4 MG tablet Generic drug: chlorpheniramine Take 4 mg by mouth in the morning and at bedtime.   cholecalciferol 25 MCG (1000 UNIT) tablet Commonly known as: VITAMIN D3 Take 1,000 Units by mouth in the morning.   cyanocobalamin 1000 MCG/ML injection Commonly known as: VITAMIN B12 Inject 1 mL (1,000 mcg total) into the skin every 30  (thirty) days.   diphenoxylate-atropine 2.5-0.025 MG tablet Commonly known as: Lomotil Take 1 tablet by mouth 4 (four) times daily as needed for diarrhea or loose stools.   famotidine 40 MG tablet Commonly known as: PEPCID TAKE ONE TABLET ONCE DAILY   fluticasone furoate-vilanterol 200-25 MCG/ACT Aepb Commonly known as: Breo Ellipta Inhale 1 puff into the lungs daily. What changed:  when to take this reasons to take this   hydrochlorothiazide 25 MG tablet Commonly known as: HYDRODIURIL Take 25 mg by mouth daily.   hydrOXYzine 25 MG tablet Commonly known as: ATARAX Take 25 mg by mouth every 4 (four) hours as needed for itching.   L-Lysine 500 MG Tabs Take 500 mg by mouth at bedtime.   levothyroxine 75 MCG tablet Commonly known as: SYNTHROID Take 1 tablet (75 mcg total) by mouth daily before breakfast.   Lubricant Eye Drops 0.4-0.3 % Soln Generic drug: Polyethyl Glycol-Propyl Glycol Place 1-2 drops into both eyes 3 (three) times daily as needed (dry/irritated eyes.).   magnesium oxide 400 (240 Mg) MG tablet Commonly known as: MAG-OX  Take 1 tablet (400 mg total) by mouth daily.   Melatonin 10 MG Caps Take 10 mg by mouth at bedtime.   ondansetron 4 MG tablet Commonly known as: ZOFRAN Take 4 mg by mouth every 8 (eight) hours as needed for vomiting or nausea.   potassium chloride 10 MEQ tablet Commonly known as: Klor-Con 10 Take 1 tablet (10 mEq total) by mouth daily.   raloxifene 60 MG tablet Commonly known as: EVISTA Take 1 tablet (60 mg total) by mouth daily.   Restasis 0.05 % ophthalmic emulsion Generic drug: cycloSPORINE Place 1 drop into both eyes 2 (two) times daily as needed (dry eyes).   rosuvastatin 20 MG tablet Commonly known as: CRESTOR Take 1 tablet (20 mg total) by mouth daily.   sodium chloride 0.65 % Soln nasal spray Commonly known as: OCEAN Place 1 spray into both nostrils as needed for congestion.   traZODone 100 MG tablet Commonly known  as: DESYREL Take 1 tablet (100 mg total) by mouth at bedtime.   valACYclovir 500 MG tablet Commonly known as: VALTREX Take 500 mg by mouth daily as needed (outbreaks).         Follow-up: Return in about 6 months (around 02/02/2024).  [name], M.D.

## 2023-08-07 ENCOUNTER — Ambulatory Visit (INDEPENDENT_AMBULATORY_CARE_PROVIDER_SITE_OTHER): Payer: Medicare HMO

## 2023-08-07 VITALS — Ht 61.0 in | Wt 176.0 lb

## 2023-08-07 DIAGNOSIS — Z780 Asymptomatic menopausal state: Secondary | ICD-10-CM | POA: Diagnosis not present

## 2023-08-07 DIAGNOSIS — Z0000 Encounter for general adult medical examination without abnormal findings: Secondary | ICD-10-CM | POA: Diagnosis not present

## 2023-08-07 DIAGNOSIS — Z0100 Encounter for examination of eyes and vision without abnormal findings: Secondary | ICD-10-CM

## 2023-08-07 DIAGNOSIS — Z122 Encounter for screening for malignant neoplasm of respiratory organs: Secondary | ICD-10-CM

## 2023-08-07 DIAGNOSIS — M8589 Other specified disorders of bone density and structure, multiple sites: Secondary | ICD-10-CM | POA: Diagnosis not present

## 2023-08-07 NOTE — Progress Notes (Signed)
Subjective:   Renee Maldonado is a 77 y.o. female who presents for Medicare Annual (Subsequent) preventive examination.  Visit Complete: Virtual  I connected with  Renee Maldonado on 08/07/23 by a audio enabled telemedicine application and verified that I am speaking with the correct person using two identifiers.  Patient Location: Home  Provider Location: Home Office  I discussed the limitations of evaluation and management by telemedicine. The patient expressed understanding and agreed to proceed.  Patient Medicare AWV questionnaire was completed by the patient on 08/07/2023; I have confirmed that all information answered by patient is correct and no changes since this date.  Cardiac Risk Factors include: advanced age (>41men, >6 women);dyslipidemia;hypertension     Objective:    Today's Vitals   08/07/23 0859  Weight: 176 lb (79.8 kg)  Height: 5\' 1"  (1.549 m)   Body mass index is 33.25 kg/m.     08/07/2023    9:08 AM 01/30/2023    3:09 AM 11/01/2022    2:51 PM 11/01/2022    2:16 AM 10/02/2022   11:22 AM 09/11/2022   12:45 PM 08/05/2022    9:57 AM  Advanced Directives  Does Patient Have a Medical Advance Directive? Yes No No No Yes Yes Yes  Type of Estate agent of [place];Living will    Healthcare Power of [place];Living will Living will;Healthcare Power of [employer] Power of [place];Living will  Does patient want to make changes to medical advance directive? No - Patient declined      No - Patient declined  Copy of Healthcare Power of Attorney in Chart? Yes - validated most recent copy scanned in chart (See row information)    No - copy requested  No - copy requested  Would patient like information on creating a medical advance directive?  No - Patient declined No - Guardian declined No - Patient declined       Current Medications (verified) Outpatient Encounter Medications as of 08/07/2023  Medication Sig   albuterol (VENTOLIN HFA) 108  (90 Base) MCG/ACT inhaler 2 puffs in lungs every 6 hours as needed for wheezing/short of breath.   amLODipine (NORVASC) 5 MG tablet Take 1 tablet (5 mg total) by mouth daily.   anastrozole (ARIMIDEX) 1 MG tablet Take 1 mg by mouth in the morning.   Calcium Carb-Cholecalciferol 600-20 MG-MCG TABS Take 1 tablet by mouth in the morning.   chlorpheniramine (CHLOR-TRIMETON) 4 MG tablet Take 4 mg by mouth in the morning and at bedtime.   cholecalciferol (VITAMIN D3) 25 MCG (1000 UNIT) tablet Take 1,000 Units by mouth in the morning.   cyanocobalamin (VITAMIN B12) 1000 MCG/ML injection Inject 1 mL (1,000 mcg total) into the skin every 30 (thirty) days.   diphenoxylate-atropine (LOMOTIL) 2.5-0.025 MG tablet Take 1 tablet by mouth 4 (four) times daily as needed for diarrhea or loose stools.   famotidine (PEPCID) 40 MG tablet TAKE ONE TABLET ONCE DAILY   fluticasone furoate-vilanterol (BREO ELLIPTA) 200-25 MCG/ACT AEPB Inhale 1 puff into the lungs daily. (Patient taking differently: Inhale 1 puff into the lungs daily as needed (shortness of breath).)   hydrochlorothiazide (HYDRODIURIL) 25 MG tablet Take 25 mg by mouth daily.   hydrOXYzine (ATARAX/VISTARIL) 25 MG tablet Take 25 mg by mouth every 4 (four) hours as needed for itching.   L-Lysine 500 MG TABS Take 500 mg by mouth at bedtime.   levothyroxine (SYNTHROID) 75 MCG tablet Take 1 tablet (75 mcg total) by mouth daily before breakfast.  magnesium oxide (MAG-OX) 400 (240 Mg) MG tablet Take 1 tablet (400 mg total) by mouth daily.   Melatonin 10 MG CAPS Take 10 mg by mouth at bedtime.   ondansetron (ZOFRAN) 4 MG tablet Take 4 mg by mouth every 8 (eight) hours as needed for vomiting or nausea.   Polyethyl Glycol-Propyl Glycol (LUBRICANT EYE DROPS) 0.4-0.3 % SOLN Place 1-2 drops into both eyes 3 (three) times daily as needed (dry/irritated eyes.).   potassium chloride (KLOR-CON 10) 10 MEQ tablet Take 1 tablet (10 mEq total) by mouth daily.   raloxifene  (EVISTA) 60 MG tablet Take 1 tablet (60 mg total) by mouth daily.   RESTASIS 0.05 % ophthalmic emulsion Place 1 drop into both eyes 2 (two) times daily as needed (dry eyes).    rosuvastatin (CRESTOR) 20 MG tablet Take 1 tablet (20 mg total) by mouth daily.   sodium chloride (OCEAN) 0.65 % SOLN nasal spray Place 1 spray into both nostrils as needed for congestion.   traZODone (DESYREL) 100 MG tablet Take 1 tablet (100 mg total) by mouth at bedtime.   valACYclovir (VALTREX) 500 MG tablet Take 500 mg by mouth daily as needed (outbreaks).   Facility-Administered Encounter Medications as of 08/07/2023  Medication   cyanocobalamin (VITAMIN B12) injection 1,000 mcg    Allergies (verified) Benadryl [diphenhydramine hcl] and Penicillins   History: Past Medical History:  Diagnosis Date   Anxiety    Arthritis    Cancer (HCC)    breast Right no chemo or radiation  on Arimidex   COPD (chronic obstructive pulmonary disease) (HCC)    on Breo   GERD (gastroesophageal reflux disease)    Humerus fracture 2007   Hyperlipidemia    Hypertension    Insomnia    Lung cancer (HCC)    OA (osteoarthritis) of knee 10/08/2020   Thyroid cancer (HCC) 2023   Past Surgical History:  Procedure Laterality Date   BREAST LUMPECTOMY Right 02/2019   BREAST SURGERY Right 01/2019   breast biopsy   BRONCHIAL BIOPSY  04/15/2022   Procedure: BRONCHIAL BIOPSIES;  Surgeon: [name], DO;  Location: [facility];  Service: Pulmonary;;   BRONCHIAL BRUSHINGS  04/15/2022   Procedure: BRONCHIAL BRUSHINGS;  Surgeon: [name], DO;  Location: [facility];  Service: Pulmonary;;   BRONCHIAL NEEDLE ASPIRATION BIOPSY  04/15/2022   Procedure: BRONCHIAL NEEDLE ASPIRATION BIOPSIES;  Surgeon: [name], DO;  Location: [facility];  Service: Pulmonary;;   COLONOSCOPY  2022   ESOPHAGOGASTRODUODENOSCOPY (EGD) WITH PROPOFOL N/A 01/02/2023   Procedure: ESOPHAGOGASTRODUODENOSCOPY (EGD) WITH PROPOFOL;  Surgeon:  Lanelle Bal, DO;  Location: [facility];  Service: Endoscopy;  Laterality: N/A;  12:45 pm, pt can't come earlier   FIDUCIAL MARKER PLACEMENT  04/15/2022   Procedure: FIDUCIAL MARKER PLACEMENT;  Surgeon: [name], DO;  Location: [facility];  Service: Pulmonary;;   JOINT REPLACEMENT     left knee   Left shoulder surgery     2007   RADICAL NECK DISSECTION Right 10/10/2022   Procedure: CENTRAL NECK DISSECTION;  Surgeon: Scarlette Ar, MD;  Location[facility] OR;  Service: ENT;  Laterality: Right;   THYROIDECTOMY Right 10/10/2022   Procedure: RIGHT THYROID LOBECTOMY;  Surgeon: Scarlette Ar, MD;  Location: [facility] OR;  Service: ENT;  Laterality: Right;   TOTAL KNEE ARTHROPLASTY     2012 left   TOTAL KNEE ARTHROPLASTY Right 10/08/2020   Procedure: TOTAL KNEE ARTHROPLASTY;  Surgeon: Ollen Gross, MD;  Location: [facility];  Service: Orthopedics;  Laterality: Right;   TUBAL LIGATION  1978   VIDEO BRONCHOSCOPY WITH RADIAL ENDOBRONCHIAL ULTRASOUND  04/15/2022   Procedure: VIDEO BRONCHOSCOPY WITH RADIAL ENDOBRONCHIAL ULTRASOUND;  Surgeon: [name], DO;  Location: [facility];  Service: Pulmonary;;   Family History  Problem Relation Age of Onset   Alzheimer's disease Mother    Diabetes Mother    Stroke Father    Hypertension Father    Cancer Brother    Liver disease Neg Hx    Social History   Socioeconomic History   Marital status: Married    Spouse name: Windy Fast   Number of children: 3   Years of education: 16   Highest education level: Bachelor's degree (e.g., BA, AB, BS)  Occupational History   Occupation: retired  Tobacco Use   Smoking status: Former    Current packs/day: 0.00    Average packs/day: 0.3 packs/day for 40.0 years (10.0 ttl pk-yrs)    Types: Cigarettes    Start date: 06/17/1982    Quit date: 06/17/2022    Years since quitting: 1.1   Smokeless tobacco: Never   Tobacco comments:    smokes 3 a day  Vaping Use   Vaping status: Former   Start date:  07/18/2018   Quit date: 07/19/2019  Substance and Sexual Activity   Alcohol use: Yes    Alcohol/week: 4.0 standard drinks of alcohol    Types: 4 Standard drinks or equivalent per week    Comment: occasional, up to 4oz of vodka or whiskey per day for a week, then nothing for months.   Drug use: Never   Sexual activity: Not Currently  Other Topics Concern   Not on file  Social History Narrative   Lives home with her husband. Enjoys travelling, playing bingo, exercising at Recreation center, eats out a lot   Social Determinants of Health   Financial Resource Strain: Low Risk  (08/07/2023)   Overall Financial Resource Strain (CARDIA)    Difficulty of Paying Living Expenses: Not hard at all  Food Insecurity: No Food Insecurity (08/07/2023)   Hunger Vital Sign    Worried About Running Out of Food in the Last Year: Never true    Ran Out of Food in the Last Year: Never true  Transportation Needs: No Transportation Needs (08/07/2023)   PRAPARE - Administrator, Civil Service (Medical): No    Lack of Transportation (Non-Medical): No  Physical Activity: Insufficiently Active (08/07/2023)   Exercise Vital Sign    Days of Exercise per Week: 3 days    Minutes of Exercise per Session: 30 min  Stress: No Stress Concern Present (08/07/2023)   [employer] of Occupational Health - Occupational Stress Questionnaire    Feeling of Stress : Not at all  Social Connections: Socially Integrated (08/07/2023)   Social Connection and Isolation Panel [NHANES]    Frequency of Communication with Friends and Family: More than three times a week    Frequency of Social Gatherings with Friends and Family: More than three times a week    Attends Religious Services: More than 4 times per year    Active Member of [employer] or Organizations: Yes    Attends Engineer, structural: More than 4 times per year    Marital Status: Married    Tobacco Counseling Counseling given: Not Answered Tobacco  comments: smokes 3 a day   Clinical Intake:  Pre-visit preparation completed: Yes  Pain : No/denies pain     Nutritional  Risks: None Diabetes: No  How often do you need to have someone help you when you read instructions, pamphlets, or other written materials from your doctor or pharmacy?: 1 - Never  Interpreter Needed?: No  Information entered by :: Renie Ora, LPN   Activities of Daily Living    08/07/2023    9:08 AM 11/01/2022    3:00 PM  In your present state of health, do you have any difficulty performing the following activities:  Hearing? 0   Vision? 0   Difficulty concentrating or making decisions? 0   Walking or climbing stairs? 0   Dressing or bathing? 0   Doing errands, shopping? 0 1  Preparing Food and eating ? N   Using the Toilet? N   In the past six months, have you accidently leaked urine? N   Do you have problems with loss of bowel control? N   Managing your Medications? N   Managing your Finances? N   Housekeeping or managing your Housekeeping? N     Patient Care Team: [name], MD as PCP - General (Family Medicine) Octavio Graves, MD as Referring Physician (Orthopedic Surgery)  Indicate any recent Medical Services you may have received from other than [facility] providers in the past year (date may be approximate).     Assessment:   This is a routine wellness examination for Diyana.  Hearing/Vision screen Vision Screening - Comments:: Wears rx glasses - up to date with routine eye exams with  Dr.Le    Goals Addressed             This Visit's Progress    DIET - INCREASE WATER INTAKE   On track    Try to drink 6-8 glasses of water daily.       Depression Screen    08/07/2023    9:05 AM 08/05/2023   11:19 AM 06/15/2023   10:20 AM 06/15/2023   10:09 AM 04/15/2023   10:14 AM 01/15/2023   12:58 PM 12/09/2022    9:30 AM  PHQ 2/9 Scores  PHQ - 2 Score 0 0 0 0 0 0 1  PHQ- 9 Score 0  1    4    Fall Risk    08/07/2023    9:00 AM  08/05/2023   11:19 AM 06/15/2023   10:20 AM 06/15/2023   10:15 AM 06/15/2023   10:09 AM  Fall Risk   Falls in the past year? 0 0 0 0 0  Number falls in past yr: 0      Injury with Fall? 0      Risk for fall due to : No Fall Risks      Follow up Falls prevention discussed        MEDICARE RISK AT HOME: Medicare Risk at Home Any stairs in or around the home?: No If so, are there any without handrails?: No Home free of loose throw rugs in walkways, pet beds, electrical cords, etc?: Yes Adequate lighting in your home to reduce risk of falls?: Yes Life alert?: No Use of a cane, walker or w/c?: No Grab bars in the bathroom?: Yes Shower chair or bench in shower?: Yes Elevated toilet seat or a handicapped toilet?: Yes  TIMED UP AND GO:  Was the test performed?  No    Cognitive Function:    03/20/2015   11:57 AM  MMSE - Mini Mental State Exam  Orientation to time 5  Orientation to Place 5  Registration 3  Attention/ Calculation 5  Recall 3  Language- name 2 objects 2  Language- repeat 1  Language- follow 3 step command 3  Language- read & follow direction 1  Write a sentence 1  Copy design 1  Total score 30        08/07/2023    9:08 AM 08/05/2022   10:02 AM 08/01/2020    9:58 AM 07/28/2019    9:54 AM  6CIT Screen  What Year? 0 points 0 points 0 points 0 points  What month? 0 points 0 points 0 points 0 points  What time? 0 points 0 points 0 points 0 points  Count back from 20 0 points 0 points 0 points 0 points  Months in reverse 0 points 0 points 0 points 0 points  Repeat phrase 0 points 0 points 0 points 0 points  Total Score 0 points 0 points 0 points 0 points    Immunizations Immunization History  Administered Date(s) Administered   Fluad Quad(high Dose 65+) 07/20/2019, 08/20/2020, 08/16/2021, 08/27/2022   Fluad Trivalent(High Dose 65+) 08/05/2023   H1N1 10/24/2008   Hep A / Hep B 01/09/2023, 02/06/2023, 07/15/2023   Influenza Inj Mdck Quad Pf 07/20/2019,  08/20/2020   Influenza Nasal 07/19/2011   Influenza Split 07/28/2013, 08/23/2014, 08/07/2015   Influenza, Seasonal, Injecte, Preservative Fre 09/11/2009, 09/05/2010   Influenza,inj,Quad PF,6+ Mos 08/24/2017, 08/30/2018   Influenza,inj,quad, With Preservative 08/15/2016, 08/24/2017   Influenza-Unspecified 10/24/2008, 08/23/2014, 08/07/2015, 08/15/2016, 08/30/2018, 07/20/2019, 08/26/2020   Moderna Sars-Covid-2 Vaccination 12/27/2019, 01/24/2020, 11/13/2020   Pneumococcal Conjugate-13 08/23/2014   Pneumococcal Polysaccharide-23 08/30/2015   Respiratory Syncytial Virus Vaccine,Recomb Aduvanted(Arexvy) 08/27/2022   Tdap 06/29/2013   Zoster Recombinant(Shingrix) 06/24/2021, 09/09/2021    TDAP status: Due, Education has been provided regarding the importance of this vaccine. Advised may receive this vaccine at local pharmacy or Health Dept. Aware to provide a copy of the vaccination record if obtained from local pharmacy or Health Dept. Verbalized acceptance and understanding.  Flu Vaccine status: Up to date  Pneumococcal vaccine status: Up to date  Covid-19 vaccine status: Completed vaccines  Qualifies for Shingles Vaccine? Yes   Zostavax completed Yes   Shingrix Completed?: Yes  Screening Tests Health Maintenance  Topic Date Due   DEXA SCAN  02/04/2022   MAMMOGRAM  04/12/2022   DTaP/Tdap/Td (2 - Td or Tdap) 06/30/2023   COVID-19 Vaccine (4 - 2023-24 season) 08/21/2023 (Originally 07/19/2023)   Medicare Annual Wellness (AWV)  08/06/2024   Pneumonia Vaccine 83+ Years old  Completed   INFLUENZA VACCINE  Completed   Hepatitis C Screening  Completed   Zoster Vaccines- Shingrix  Completed   HPV VACCINES  Aged Out   Colonoscopy  Discontinued    Health Maintenance  Health Maintenance Due  Topic Date Due   DEXA SCAN  02/04/2022   MAMMOGRAM  04/12/2022   DTaP/Tdap/Td (2 - Td or Tdap) 06/30/2023    Colorectal cancer screening: No longer required.   Mammogram status: No longer  required due to age.  Bone Density status: Completed 08/05/2023. Results reflect: Bone density results: OSTEOPOROSIS. Repeat every 2 years.  Lung Cancer Screening: (Low Dose CT Chest recommended if Age 30-80 years, 20 pack-year currently smoking OR have quit w/in 15years.) does qualify.   Lung Cancer Screening Referral: Scheduled 08/19/2023  Additional Screening:  Hepatitis C Screening: does not qualify; Completed 12/22/2022  Vision Screening: Recommended annual ophthalmology exams for early detection of glaucoma and other disorders of the eye. Is the patient up to date with their  annual eye exam?  Yes  Who is the provider or what is the name of the office in which the patient attends annual eye exams? Dr.Le  If pt is not established with a provider, would they like to be referred to a provider to establish care? No .   Dental Screening: Recommended annual dental exams for proper oral hygiene   Community Resource Referral / Chronic Care Management: CRR required this visit?  No   CCM required this visit?  No     Plan:     I have personally reviewed and noted the following in the patient's chart:   Medical and social history Use of alcohol, tobacco or illicit drugs  Current medications and supplements including opioid prescriptions. Patient is not currently taking opioid prescriptions. Functional ability and status Nutritional status Physical activity Advanced directives List of other physicians Hospitalizations, surgeries, and ER visits in previous 12 months Vitals Screenings to include cognitive, depression, and falls Referrals and appointments  In addition, I have reviewed and discussed with patient certain preventive protocols, quality metrics, and best practice recommendations. A written personalized care plan for preventive services as well as general preventive health recommendations were provided to patient.     Lorrene Reid, LPN   02/23/8118   After Visit  Summary: (MyChart) Due to this being a telephonic visit, the after visit summary with patients personalized plan was offered to patient via MyChart   Nurse Notes: Due Tdap Vaccine

## 2023-08-07 NOTE — Patient Instructions (Signed)
Renee Maldonado , Thank you for taking time to come for your Medicare Wellness Visit. I appreciate your ongoing commitment to your health goals. Please review the following plan we discussed and let me know if I can assist you in the future.   Referrals/Orders/Follow-Ups/Clinician Recommendations: Aim for 30 minutes of exercise or brisk walking, 6-8 glasses of water, and 5 servings of fruits and vegetables each day.   This is a list of the screening recommended for you and due dates:  Health Maintenance  Topic Date Due   DEXA scan (bone density measurement)  02/04/2022   Mammogram  04/12/2022   DTaP/Tdap/Td vaccine (2 - Td or Tdap) 06/30/2023   COVID-19 Vaccine (4 - 2023-24 season) 08/21/2023*   Medicare Annual Wellness Visit  08/06/2024   Pneumonia Vaccine  Completed   Flu Shot  Completed   Hepatitis C Screening  Completed   Zoster (Shingles) Vaccine  Completed   HPV Vaccine  Aged Out   Colon Cancer Screening  Discontinued  *Topic was postponed. The date shown is not the original due date.    Advanced directives: (In Chart) A copy of your advanced directives are scanned into your chart should your provider ever need it.  Next Medicare Annual Wellness Visit scheduled for next year: Yes  Insert Preventive Care attachment Insert FALL PREVENTION attachment if needed

## 2023-08-11 NOTE — Progress Notes (Signed)
DEXA shows osteopenia. I recommend weekly fosamax. ?Nurse, if pt. Is agreeable, send in Fosamax 70 mg weekly, #13. ? ?Thanks, ?WS ?

## 2023-08-17 ENCOUNTER — Other Ambulatory Visit: Payer: Self-pay | Admitting: Family Medicine

## 2023-08-17 DIAGNOSIS — I10 Essential (primary) hypertension: Secondary | ICD-10-CM

## 2023-08-18 DIAGNOSIS — F17200 Nicotine dependence, unspecified, uncomplicated: Secondary | ICD-10-CM | POA: Diagnosis not present

## 2023-08-18 DIAGNOSIS — T50905A Adverse effect of unspecified drugs, medicaments and biological substances, initial encounter: Secondary | ICD-10-CM | POA: Diagnosis not present

## 2023-08-18 DIAGNOSIS — C73 Malignant neoplasm of thyroid gland: Secondary | ICD-10-CM | POA: Diagnosis not present

## 2023-08-18 DIAGNOSIS — Z5181 Encounter for therapeutic drug level monitoring: Secondary | ICD-10-CM | POA: Diagnosis not present

## 2023-08-18 DIAGNOSIS — M818 Other osteoporosis without current pathological fracture: Secondary | ICD-10-CM | POA: Diagnosis not present

## 2023-08-18 DIAGNOSIS — Z170 Estrogen receptor positive status [ER+]: Secondary | ICD-10-CM | POA: Diagnosis not present

## 2023-08-18 DIAGNOSIS — C3491 Malignant neoplasm of unspecified part of right bronchus or lung: Secondary | ICD-10-CM | POA: Diagnosis not present

## 2023-08-18 DIAGNOSIS — R928 Other abnormal and inconclusive findings on diagnostic imaging of breast: Secondary | ICD-10-CM | POA: Diagnosis not present

## 2023-08-18 DIAGNOSIS — Z79811 Long term (current) use of aromatase inhibitors: Secondary | ICD-10-CM | POA: Diagnosis not present

## 2023-08-18 DIAGNOSIS — C50311 Malignant neoplasm of lower-inner quadrant of right female breast: Secondary | ICD-10-CM | POA: Diagnosis not present

## 2023-08-19 ENCOUNTER — Ambulatory Visit (HOSPITAL_COMMUNITY)
Admission: RE | Admit: 2023-08-19 | Discharge: 2023-08-19 | Disposition: A | Discharge status: Home / Self Care | Payer: Medicare HMO | Source: Ambulatory Visit | Attending: Urology | Admitting: Urology

## 2023-08-19 DIAGNOSIS — C3431 Malignant neoplasm of lower lobe, right bronchus or lung: Secondary | ICD-10-CM | POA: Diagnosis not present

## 2023-08-19 DIAGNOSIS — J90 Pleural effusion, not elsewhere classified: Secondary | ICD-10-CM | POA: Diagnosis not present

## 2023-08-19 DIAGNOSIS — I700 Atherosclerosis of aorta: Secondary | ICD-10-CM | POA: Diagnosis not present

## 2023-08-19 DIAGNOSIS — S2231XA Fracture of one rib, right side, initial encounter for closed fracture: Secondary | ICD-10-CM | POA: Diagnosis not present

## 2023-08-20 ENCOUNTER — Ambulatory Visit (INDEPENDENT_AMBULATORY_CARE_PROVIDER_SITE_OTHER): Payer: Medicare HMO | Admitting: Family Medicine

## 2023-08-20 ENCOUNTER — Encounter: Payer: Self-pay | Admitting: Family Medicine

## 2023-08-20 VITALS — BP 130/67 | HR 89 | Temp 97.6°F | Ht 61.0 in | Wt 177.6 lb

## 2023-08-20 DIAGNOSIS — G588 Other specified mononeuropathies: Secondary | ICD-10-CM | POA: Diagnosis not present

## 2023-08-20 MED ORDER — PREDNISONE 10 MG PO TABS: ORAL_TABLET | ORAL | 0 refills | Status: DC

## 2023-08-20 MED ORDER — AZITHROMYCIN 250 MG PO TABS: ORAL_TABLET | ORAL | 0 refills | Status: DC

## 2023-08-20 MED ORDER — ALENDRONATE SODIUM 70 MG PO TABS: 70.0000 mg | ORAL_TABLET | ORAL | 3 refills | Status: DC

## 2023-08-20 NOTE — Progress Notes (Signed)
Subjective:  Patient ID: Renee Maldonado, female    DOB: 1946-11-08  Age: 77 y.o. MRN: [id number]  CC: No chief complaint on file.   HPI Renee Maldonado presents for coughing and right midback pain from the t7 area following the rib margin anteriorly under the right breast.      08/20/2023    4:06 PM 08/07/2023    9:05 AM 08/05/2023   11:19 AM  Depression screen PHQ 2/9  Decreased Interest 0 0 0  Down, Depressed, Hopeless 0 0 0  PHQ - 2 Score 0 0 0  Altered sleeping  0   Tired, decreased energy  0   Change in appetite  0   Feeling bad or failure about yourself   0   Trouble concentrating  0   Moving slowly or fidgety/restless  0   Suicidal thoughts  0   PHQ-9 Score  0   Difficult doing work/chores  Not difficult at all     History Renee Maldonado has a past medical history of Anxiety, Arthritis, Cancer (HCC), COPD (chronic obstructive pulmonary disease) (HCC), GERD (gastroesophageal reflux disease), Humerus fracture (2007), Hyperlipidemia, Hypertension, Insomnia, Lung cancer (HCC), OA (osteoarthritis) of knee (10/08/2020), and Thyroid cancer (HCC) (2023).   She has a past surgical history that includes Total knee arthroplasty; Left shoulder surgery; Joint replacement; Tubal ligation (1978); Breast surgery (Right, 01/2019); Breast lumpectomy (Right, 02/2019); Total knee arthroplasty (Right, 10/08/2020); Bronchial biopsy (04/15/2022); Bronchial needle aspiration biopsy (04/15/2022); Bronchial brushings (04/15/2022); Video bronchoscopy with radial endobronchial ultrasound (04/15/2022); Fiducial marker placement (04/15/2022); Colonoscopy (2022); Thyroidectomy (Right, 10/10/2022); Radical neck dissection (Right, 10/10/2022); and Esophagogastroduodenoscopy (egd) with propofol (N/A, 01/02/2023).   Her family history includes Alzheimer's disease in her mother; Cancer in her brother; Diabetes in her mother; Hypertension in her father; Stroke in her father.She reports that she quit smoking about 14 months  ago. Her smoking use included cigarettes. She started smoking about 41 years ago. She has a 10 pack-year smoking history. She has never used smokeless tobacco. She reports current alcohol use of about 4.0 standard drinks of alcohol per week. She reports that she does not use drugs.    ROS Review of Systems  Constitutional: Negative.   HENT: Negative.    Eyes:  Negative for visual disturbance.  Respiratory:  Negative for shortness of breath.   Cardiovascular:  Negative for chest pain.  Gastrointestinal:  Negative for abdominal pain.  Genitourinary:  Positive for flank pain. Negative for difficulty urinating and dysuria.  Musculoskeletal:  Negative for arthralgias.    Objective:  BP 130/67   Pulse 89   Temp 97.6 F (36.4 C)   Ht 5\' 1"  (1.549 m)   Wt 177 lb 9.6 oz (80.6 kg)   SpO2 93%   BMI 33.56 kg/m   BP Readings from Last 3 Encounters:  08/20/23 130/67  08/05/23 125/65  06/25/23 134/65    Wt Readings from Last 3 Encounters:  08/20/23 177 lb 9.6 oz (80.6 kg)  08/07/23 176 lb (79.8 kg)  08/05/23 176 lb 3.2 oz (79.9 kg)     Physical Exam Musculoskeletal:        General: Tenderness (along 7th rib from back to front on right) present.       Assessment & Plan:   Diagnoses and all orders for this visit:  Intercostal neuralgia  Other orders -     alendronate (FOSAMAX) 70 MG tablet; Take 1 tablet (70 mg total) by mouth every 7 (seven) days. Take with a full glass  of water on an empty stomach. Do not lay down for at least 2 hours -     predniSONE (DELTASONE) 10 MG tablet; Take 5 daily for 3 days followed by 4,3,2 and 1 for 3 days each. -     azithromycin (ZITHROMAX Z-PAK) 250 MG tablet; Take two right away Then one a day for the next 4 days.       I am having Renee Maldonado start on alendronate, predniSONE, and azithromycin. I am also having her maintain her Restasis, anastrozole, Chlor-Trimeton, albuterol, hydrOXYzine, Calcium Carb-Cholecalciferol,  cholecalciferol, cyanocobalamin, fluticasone furoate-vilanterol, Melatonin, L-Lysine, sodium chloride, Lubricant Eye Drops, magnesium oxide, potassium chloride, ondansetron, valACYclovir, rosuvastatin, diphenoxylate-atropine, raloxifene, traZODone, hydrochlorothiazide, levothyroxine, famotidine, and amLODipine. We will continue to administer cyanocobalamin.  Allergies as of 08/20/2023       Reactions   Benadryl [diphenhydramine Hcl] Nausea Only   Dizziness   Penicillins    Had a rash around 20 years ago but unsure if it come from medicine.  Tolerated Cephalosporin Date: 10/09/20.        Medication List        Accurate as of August 20, 2023 11:59 PM. If you have any questions, ask your nurse or doctor.          albuterol 108 (90 Base) MCG/ACT inhaler Commonly known as: VENTOLIN HFA 2 puffs in lungs every 6 hours as needed for wheezing/short of breath.   alendronate 70 MG tablet Commonly known as: FOSAMAX Take 1 tablet (70 mg total) by mouth every 7 (seven) days. Take with a full glass of water on an empty stomach. Do not lay down for at least 2 hours Started by: Jeffry Vogelsang   amLODipine 5 MG tablet Commonly known as: NORVASC TAKE ONE TABLET ONCE DAILY   anastrozole 1 MG tablet Commonly known as: ARIMIDEX Take 1 mg by mouth in the morning.   azithromycin 250 MG tablet Commonly known as: Zithromax Z-Pak Take two right away Then one a day for the next 4 days. Started by: Thara Searing   Calcium Carb-Cholecalciferol 600-20 MG-MCG Tabs Take 1 tablet by mouth in the morning.   Chlor-Trimeton 4 MG tablet Generic drug: chlorpheniramine Take 4 mg by mouth in the morning and at bedtime.   cholecalciferol 25 MCG (1000 UNIT) tablet Commonly known as: VITAMIN D3 Take 1,000 Units by mouth in the morning.   cyanocobalamin 1000 MCG/ML injection Commonly known as: VITAMIN B12 Inject 1 mL (1,000 mcg total) into the skin every 30 (thirty) days.   diphenoxylate-atropine  2.5-0.025 MG tablet Commonly known as: Lomotil Take 1 tablet by mouth 4 (four) times daily as needed for diarrhea or loose stools.   famotidine 40 MG tablet Commonly known as: PEPCID TAKE ONE TABLET ONCE DAILY   fluticasone furoate-vilanterol 200-25 MCG/ACT Aepb Commonly known as: Breo Ellipta Inhale 1 puff into the lungs daily. What changed:  when to take this reasons to take this   hydrochlorothiazide 25 MG tablet Commonly known as: HYDRODIURIL Take 25 mg by mouth daily.   hydrOXYzine 25 MG tablet Commonly known as: ATARAX Take 25 mg by mouth every 4 (four) hours as needed for itching.   L-Lysine 500 MG Tabs Take 500 mg by mouth at bedtime.   levothyroxine 75 MCG tablet Commonly known as: SYNTHROID Take 1 tablet (75 mcg total) by mouth daily before breakfast.   Lubricant Eye Drops 0.4-0.3 % Soln Generic drug: Polyethyl Glycol-Propyl Glycol Place 1-2 drops into both eyes 3 (three) times daily as needed (dry/irritated  eyes.).   magnesium oxide 400 (240 Mg) MG tablet Commonly known as: MAG-OX Take 1 tablet (400 mg total) by mouth daily.   Melatonin 10 MG Caps Take 10 mg by mouth at bedtime.   ondansetron 4 MG tablet Commonly known as: ZOFRAN Take 4 mg by mouth every 8 (eight) hours as needed for vomiting or nausea.   potassium chloride 10 MEQ tablet Commonly known as: Klor-Con 10 Take 1 tablet (10 mEq total) by mouth daily.   predniSONE 10 MG tablet Commonly known as: DELTASONE Take 5 daily for 3 days followed by 4,3,2 and 1 for 3 days each. Started by: Donyea Beverlin   raloxifene 60 MG tablet Commonly known as: EVISTA Take 1 tablet (60 mg total) by mouth daily.   Restasis 0.05 % ophthalmic emulsion Generic drug: cycloSPORINE Place 1 drop into both eyes 2 (two) times daily as needed (dry eyes).   rosuvastatin 20 MG tablet Commonly known as: CRESTOR Take 1 tablet (20 mg total) by mouth daily.   sodium chloride 0.65 % Soln nasal spray Commonly known as:  OCEAN Place 1 spray into both nostrils as needed for congestion.   traZODone 100 MG tablet Commonly known as: DESYREL Take 1 tablet (100 mg total) by mouth at bedtime.   valACYclovir 500 MG tablet Commonly known as: VALTREX Take 500 mg by mouth daily as needed (outbreaks).         Follow-up: No follow-ups on file.  [name], M.D.

## 2023-08-21 ENCOUNTER — Ambulatory Visit (INDEPENDENT_AMBULATORY_CARE_PROVIDER_SITE_OTHER): Payer: Medicare HMO

## 2023-08-21 DIAGNOSIS — Z23 Encounter for immunization: Secondary | ICD-10-CM

## 2023-08-24 ENCOUNTER — Ambulatory Visit (INDEPENDENT_AMBULATORY_CARE_PROVIDER_SITE_OTHER): Payer: Medicare HMO | Admitting: *Deleted

## 2023-08-24 DIAGNOSIS — E538 Deficiency of other specified B group vitamins: Secondary | ICD-10-CM | POA: Diagnosis not present

## 2023-08-24 NOTE — Progress Notes (Signed)
In today for B12 injection, we did right deltoid today, since she got tetanus on Friday in the left arm

## 2023-08-25 ENCOUNTER — Telehealth: Payer: Self-pay | Admitting: *Deleted

## 2023-08-25 NOTE — Telephone Encounter (Signed)
CALLED PATIENT TO ASK ABOUT Renee Maldonado CALLING HER ON 08-27-23 @ 10:30 AM, SPOKE WITH PATIENT AND SHE AGREED TO ACCEPT THIS PHONE CALL

## 2023-08-25 NOTE — Telephone Encounter (Signed)
Called patient to ask about being called with CT results on 08-27-23, was informed by relative that she is out and will back after 12 today, will call later

## 2023-08-26 ENCOUNTER — Ambulatory Visit: Payer: Medicare HMO | Admitting: Urology

## 2023-08-27 ENCOUNTER — Encounter: Payer: Self-pay | Admitting: Urology

## 2023-08-27 ENCOUNTER — Ambulatory Visit
Admission: RE | Admit: 2023-08-27 | Discharge: 2023-08-27 | Disposition: A | Discharge status: Home / Self Care | Payer: Medicare HMO | Source: Ambulatory Visit | Attending: Urology | Admitting: Urology

## 2023-08-27 DIAGNOSIS — C3431 Malignant neoplasm of lower lobe, right bronchus or lung: Secondary | ICD-10-CM | POA: Diagnosis not present

## 2023-08-27 DIAGNOSIS — Z87891 Personal history of nicotine dependence: Secondary | ICD-10-CM | POA: Diagnosis not present

## 2023-08-27 NOTE — Progress Notes (Signed)
Radiation Oncology         ([phone]) [phone] ________________________________  Name: Renee Maldonado MRN: [id number]  Date: 08/27/2023  DOB: July 05, 1946  Post Treatment Note  CC: Renee Claude, MD  Renee Igo, DO  Diagnosis:   77 y.o. female with Stage IA, NSCLC, adenocarcinoma of the right lower lobe lung.  Interval Since Last Radiation:  1 year, 2 months  06/02/22 - 06/09/22:   The target in the RLL lung was treated to 54 Gy in 3 fractions of 18 Gy  Narrative:  I spoke with the patient to conduct her routine scheduled follow up visit to review results of her recent CT chest scan via telephone to spare the patient unnecessary potential exposure in the healthcare setting during the current COVID-19 pandemic.  The patient was notified in advance and gave permission to proceed with this visit format.  She tolerated radiation treatment relatively well with only mild fatigue.  Her post-treatment CT Chest scan from 04/24/23 showed increased consolidation and ground-glass in the superior portion of the right lower lobe and adjacent right upper lobe, likely evolving postradiation change, and an unchanged appearance of the other lung nodules. No definite disease progression or recurrence. She had a recent follow up CT Chest on 08/19/23 that shows stable radiation changes and an unchanged appearance of the additional bilateral lung nodules.          She was also diagnosed with papillary thyroid carcinoma around the same time as her lung cancer diagnosis and was seen by Dr. Marene Lenz on 04/01/22 to discuss treatment. The recommendation was for surgical resection, likely hemithyroidectomy, to be scheduled after completing her lung treatments. She was quite anxious about the delay in treating the thyroid cancer and was seen in consult with Dr. Ernestene Kiel on 08/11/22. She subsequently had a right thyroid lobectomy with right central neck dissection under the care of Dr. Ernestene Kiel on 10/10/22 and surgical pathology confirmed  [id number] papillary thyroid carcinoma.  She is followed by Dr. Kathreen Cosier in endocrinology at [facility] for surveillance labs and also continues in routine follow up with Dr. Nevin Bloodgood (medical oncologist at [facility]) for her history of breast cancer. She did have an abnormal mammogram in June with changes noted in the left breast so she had additional imaging and U/S of the left breast 04/28/23 which showed fibrocystic changes only, BIRADS 3- likely benign. She will have follow up imaging of the left breast in 10/2023 for close observation.                    On review of systems, the patient states that she is doing well in general and remains without complaints. She specifically denies dysphagia, increased shortness of breath, chest pain or hemoptysis. Her husband has been coughing for over a month and just this past week, she has developed a pretty harsh cough, with clear-whitish sputum only, and has recently completed a Z-pak and is taking a prednisone taper prescribed by her PCP.  She does feel like she is gradually improving and has very little residual cough at this point. Unfortunately, she fractured her right 7th rib with the coughing- seen on recent CT imaging, but the pain is improving. She reports a healthy appetite and is maintaining her weight. No recent fevers, chills or night sweats.    ALLERGIES:  is allergic to benadryl [diphenhydramine hcl] and penicillins.  Meds: Current Outpatient Medications  Medication Sig Dispense Refill   albuterol (VENTOLIN HFA) 108 (90 Base) MCG/ACT inhaler 2 puffs  in lungs every 6 hours as needed for wheezing/short of breath. 8.5 g 5   alendronate (FOSAMAX) 70 MG tablet Take 1 tablet (70 mg total) by mouth every 7 (seven) days. Take with a full glass of water on an empty stomach. Do not lay down for at least 2 hours 13 tablet 3   amLODipine (NORVASC) 5 MG tablet TAKE ONE TABLET ONCE DAILY 90 tablet 1   anastrozole (ARIMIDEX) 1 MG tablet Take 1 mg by mouth in the morning.      azithromycin (ZITHROMAX Z-PAK) 250 MG tablet Take two right away Then one a day for the next 4 days. 6 each 0   Calcium Carb-Cholecalciferol 600-20 MG-MCG TABS Take 1 tablet by mouth in the morning.     chlorpheniramine (CHLOR-TRIMETON) 4 MG tablet Take 4 mg by mouth in the morning and at bedtime.     cholecalciferol (VITAMIN D3) 25 MCG (1000 UNIT) tablet Take 1,000 Units by mouth in the morning.     cyanocobalamin (VITAMIN B12) 1000 MCG/ML injection Inject 1 mL (1,000 mcg total) into the skin every 30 (thirty) days. 1 mL 11   diphenoxylate-atropine (LOMOTIL) 2.5-0.025 MG tablet Take 1 tablet by mouth 4 (four) times daily as needed for diarrhea or loose stools. 30 tablet 0   famotidine (PEPCID) 40 MG tablet TAKE ONE TABLET ONCE DAILY 90 tablet 1   fluticasone furoate-vilanterol (BREO ELLIPTA) 200-25 MCG/ACT AEPB Inhale 1 puff into the lungs daily. (Patient taking differently: Inhale 1 puff into the lungs daily as needed (shortness of breath).) 1 each 11   hydrochlorothiazide (HYDRODIURIL) 25 MG tablet Take 25 mg by mouth daily.     hydrOXYzine (ATARAX/VISTARIL) 25 MG tablet Take 25 mg by mouth every 4 (four) hours as needed for itching.     L-Lysine 500 MG TABS Take 500 mg by mouth at bedtime.     levothyroxine (SYNTHROID) 75 MCG tablet Take 1 tablet (75 mcg total) by mouth daily before breakfast. 90 tablet 1   magnesium oxide (MAG-OX) 400 (240 Mg) MG tablet Take 1 tablet (400 mg total) by mouth daily. 90 tablet 3   Melatonin 10 MG CAPS Take 10 mg by mouth at bedtime.     ondansetron (ZOFRAN) 4 MG tablet Take 4 mg by mouth every 8 (eight) hours as needed for vomiting or nausea.     Polyethyl Glycol-Propyl Glycol (LUBRICANT EYE DROPS) 0.4-0.3 % SOLN Place 1-2 drops into both eyes 3 (three) times daily as needed (dry/irritated eyes.).     potassium chloride (KLOR-CON 10) 10 MEQ tablet Take 1 tablet (10 mEq total) by mouth daily. 180 tablet 3   predniSONE (DELTASONE) 10 MG tablet Take 5 daily for 3  days followed by 4,3,2 and 1 for 3 days each. 45 tablet 0   raloxifene (EVISTA) 60 MG tablet Take 1 tablet (60 mg total) by mouth daily. 90 tablet 2   RESTASIS 0.05 % ophthalmic emulsion Place 1 drop into both eyes 2 (two) times daily as needed (dry eyes).      rosuvastatin (CRESTOR) 20 MG tablet Take 1 tablet (20 mg total) by mouth daily. 90 tablet 3   sodium chloride (OCEAN) 0.65 % SOLN nasal spray Place 1 spray into both nostrils as needed for congestion.     traZODone (DESYREL) 100 MG tablet Take 1 tablet (100 mg total) by mouth at bedtime. 90 tablet 3   valACYclovir (VALTREX) 500 MG tablet Take 500 mg by mouth daily as needed (outbreaks).  Current Facility-Administered Medications  Medication Dose Route Frequency Provider Last Rate Last Admin   cyanocobalamin (VITAMIN B12) injection 1,000 mcg  1,000 mcg Intramuscular Q30 days Renee Claude, MD   1,000 mcg at 08/24/23 1030    Physical Findings:  vitals were not taken for this visit.   /10 Unable to assess due to telephone follow up visit format.  Lab Findings: Lab Results  Component Value Date   WBC 7.3 06/25/2023   HGB 12.1 06/25/2023   HCT 37.8 06/25/2023   MCV 83 06/25/2023   PLT 302 06/25/2023     Radiographic Findings: CT Chest Wo Contrast  Result Date: 08/25/2023 CLINICAL DATA:  Right lower lobe lung cancer, status post SBRT EXAM: CT CHEST WITHOUT CONTRAST TECHNIQUE: Multidetector CT imaging of the chest was performed following the standard protocol without IV contrast. RADIATION DOSE REDUCTION: This exam was performed according to the departmental dose-optimization program which includes automated exposure control, adjustment of the mA and/or kV according to patient size and/or use of iterative reconstruction technique. COMPARISON:  04/24/2023 FINDINGS: Cardiovascular: The heart is normal in size. No pericardial effusion. No evidence of thoracic aortic aneurysm. Atherosclerotic calcifications of the aortic arch. Mild  coronary atherosclerosis of the LAD. Mediastinum/Nodes: No suspicious mediastinal lymphadenopathy. Status post right thyroidectomy. Left thyroid is within normal limits. Lungs/Pleura: Radiation changes in the right lower lobe, status post SBRT (series 3/image 86). Additional scarring in the posterior right upper lobe and in the right middle lobe. 18 x 10 mm subsolid nodule in the right lung apex (series 3/image 24), grossly unchanged. 12 mm ground-glass nodule in the central right lower lobe (series 3/image 89), unchanged. 6 mm subpleural nodular opacity in the posterior left upper lobe (series 3/image 43), unchanged. No focal consolidation. Small right pleural effusion, new.  No pneumothorax. Upper Abdomen: Visualized upper abdomen is grossly unremarkable, noting vascular calcifications. Musculoskeletal: Mild degenerative changes of the visualized thoracolumbar spine. Nondisplaced right posterolateral 7th rib fracture (series 2/image 48), new. IMPRESSION: Radiation changes in the right lower lobe, status post SBRT. Additional pulmonary nodules are unchanged, as above. Nondisplaced right posterolateral 7th rib fracture. Associated small right pleural effusion, new. Aortic Atherosclerosis (ICD10-I70.0). Electronically Signed   By: Charline Bills M.D.   On: 08/25/2023 10:58   DG [facility] DEXA  Result Date: 08/07/2023 EXAM: DUAL X-RAY ABSORPTIOMETRY (DXA) FOR BONE MINERAL DENSITY 08/07/2023 3:35 pm CLINICAL DATA:  77 year old Female Postmenopausal. post menopausal History of fragility fracture. Patient is or has been on bone building therapies. TECHNIQUE: An axial (e.g., hips, spine) and/or appendicular (e.g., radius) exam was performed, as appropriate, using [employer] systems engineer at [facility]. Images are obtained for bone mineral density measurement and are not obtained for diagnostic purposes. [id number] Exclusions: L3 COMPARISON:  02/08/2018. FINDINGS: Scan quality: Good.  LUMBAR SPINE (L1-L2, L4): BMD (in g/cm2): 0.946 T-score: -1.9 Z-score: -0.6 Rate of change from previous exam: -5.6 % LEFT FEMORAL NECK: BMD (in g/cm2): 0.873 T-score: -1.2 Z-score: 0.5 LEFT TOTAL HIP: BMD (in g/cm2): 0.905 T-score: -0.8 Z-score: 0.7 RIGHT FEMORAL NECK: BMD (in g/cm2): 0.732 T-score: -2.2 Z-score: -0.5 RIGHT TOTAL HIP: BMD (in g/cm2): 0.807 T-score: -1.6 Z-score: -0.1 Rate of change from previous exam: No significant rate of change from previous exam. FRAX 10-YEAR PROBABILITY OF FRACTURE: FRAX not reported as the patient is receiving bone building therapy. IMPRESSION: Osteopenia based on BMD. Fracture risk is unknown due to history of bone building therapy. RECOMMENDATIONS: 1. All patients should optimize calcium and  vitamin D intake. 2. Consider FDA-approved medical therapies in postmenopausal women and men aged 52 years and older, based on the following: - A hip or vertebral (clinical or morphometric) fracture - T-score less than or equal to -2.5 and secondary causes have been excluded. - Low bone mass (T-score between -1.0 and -2.5) and a 10-year probability of a hip fracture greater than or equal to 3% or a 10-year probability of a major osteoporosis-related fracture greater than or equal to 20% based on the [place]-adapted WHO algorithm. - Clinician judgment and/or patient preferences may indicate treatment for people with 10-year fracture probabilities above or below these levels 3. Patients with diagnosis of osteoporosis or at high risk for fracture should have regular bone mineral density tests. For patients eligible for Medicare, routine testing is allowed once every 2 years. The testing frequency can be increased to one year for patients who have rapidly progressing disease, those who are receiving or discontinuing medical therapy to restore bone mass, or have additional risk factors. Electronically Signed   By: Baird Lyons M.D.   On: 08/07/2023 15:43    Impression/Plan: 1. 77 y.o. female  with Stage IA, NSCLC, adenocarcinoma of the right lower lobe lung. She has recovered well from the effects of her SBRT lung treatment and remains without complaints. Her recent post-treatment CT Chest scan from 10/2/2shows stable radiation changes and an unchanged appearance of the additional bilateral lung nodules. There is a new right 7th rib fracture that occurred with her recent bout of bronchitis. No evidence of disease progression or recurrence. Therefore, we will proceed with serial CT Chest scans every 6 months to continue to closely monitor for any evidence of disease recurrence or progression. I will call her to review the results of each scan but she knows that she is welcome to call at any time in the interim with questions or concerns.  She is scheduled for a CT Chest with contrast in 11/2023, that her PCP ordered so I will watch for those results and if stable, we can push her surveillance CT Chest scan out to 05/2024.  She will continue her routine follow up with her endocrinologist, Dr. Kathreen Cosier for labs s/p thyroidectomy and with Dr. Nevin Bloodgood for her history of breast cancer.   I personally spent 30 minutes in this encounter including chart review, reviewing radiological studies, telephone conversation with the patient, entering orders and completing documentation.     Marguarite Arbour, PA-C

## 2023-08-27 NOTE — Progress Notes (Addendum)
Telephone nursing appointment for review of most recent CT results. I verified patient's identity x2 and began nursing interview.   Patient reports recovering from a cough w/ clear mucus production but has been completed a round Azithromycin and is doing well. Patient denies any other related issues at this time.   Meaningful use complete.   Patient aware of their 10:30am telephone appointment w/ Ashlyn  PA-C. I left my extension [phone] in case patient needs anything. Patient verbalized understanding. This concludes the nursing interview.   Patient contact [phone]     Ruel Favors, LPN

## 2023-09-02 ENCOUNTER — Ambulatory Visit: Payer: Medicare HMO | Admitting: Urology

## 2023-09-10 ENCOUNTER — Ambulatory Visit: Payer: Medicare HMO | Admitting: Family Medicine

## 2023-09-14 ENCOUNTER — Ambulatory Visit: Payer: Medicare HMO | Admitting: Family Medicine

## 2023-09-16 ENCOUNTER — Ambulatory Visit: Payer: Medicare HMO | Admitting: Family Medicine

## 2023-10-05 ENCOUNTER — Ambulatory Visit (INDEPENDENT_AMBULATORY_CARE_PROVIDER_SITE_OTHER): Payer: Medicare HMO

## 2023-10-05 DIAGNOSIS — E538 Deficiency of other specified B group vitamins: Secondary | ICD-10-CM | POA: Diagnosis not present

## 2023-10-05 NOTE — Progress Notes (Signed)
B12 injection given to patient in left deltoid tolerated well.

## 2023-10-12 ENCOUNTER — Encounter: Payer: Self-pay | Admitting: Family Medicine

## 2023-10-12 ENCOUNTER — Ambulatory Visit (INDEPENDENT_AMBULATORY_CARE_PROVIDER_SITE_OTHER): Payer: Medicare HMO | Admitting: Family Medicine

## 2023-10-12 VITALS — BP 138/76 | HR 78 | Temp 97.8°F | Ht 61.0 in | Wt 176.8 lb

## 2023-10-12 DIAGNOSIS — E782 Mixed hyperlipidemia: Secondary | ICD-10-CM

## 2023-10-12 DIAGNOSIS — I10 Essential (primary) hypertension: Secondary | ICD-10-CM

## 2023-10-12 DIAGNOSIS — E559 Vitamin D deficiency, unspecified: Secondary | ICD-10-CM

## 2023-10-12 DIAGNOSIS — E890 Postprocedural hypothyroidism: Secondary | ICD-10-CM | POA: Diagnosis not present

## 2023-10-12 MED ORDER — LEVOTHYROXINE SODIUM 75 MCG PO TABS: 75.0000 ug | ORAL_TABLET | Freq: Every day | ORAL | 1 refills | Status: DC

## 2023-10-12 MED ORDER — POTASSIUM CHLORIDE ER 10 MEQ PO TBCR: 10.0000 meq | EXTENDED_RELEASE_TABLET | Freq: Every day | ORAL | 3 refills | Status: DC

## 2023-10-12 MED ORDER — MAGNESIUM OXIDE -MG SUPPLEMENT 400 (240 MG) MG PO TABS: 400.0000 mg | ORAL_TABLET | Freq: Every day | ORAL | 3 refills | Status: DC

## 2023-10-12 NOTE — Patient Instructions (Signed)
Moisturize with one of these:  [employer] stage manager [employer]

## 2023-10-12 NOTE — Progress Notes (Signed)
Subjective:  Patient ID: Renee Maldonado, female    DOB: [birth year]  Age: 77 y.o. MRN: [id number]  CC: Medical Management of Chronic Issues   HPI Renee Maldonado presents for  follow-up on  thyroid. The patient has a history of hypothyroidism for many years. It has been stable recently. Pt. denies any change in  voice, loss of hair, heat or cold intolerance. Energy level has been adequate to good. Patient denies constipation and diarrhea. No myxedema. Medication is as noted below. Verified that pt is taking it daily on an empty stomach. Well tolerated.    presents for  follow-up of hypertension. Patient has no history of headache chest pain or shortness of breath or recent cough. Patient also denies symptoms of TIA such as focal numbness or weakness. Patient denies side effects from medication. States taking it regularly.   in for follow-up of elevated cholesterol. Doing well without complaints on current medication. Denies side effects of statin including myalgia and arthralgia and nausea. Currently no chest pain, shortness of breath or other cardiovascular related symptoms noted.      10/12/2023   10:27 AM 08/20/2023    4:06 PM 08/07/2023    9:05 AM  Depression screen PHQ 2/9  Decreased Interest 0 0 0  Down, Depressed, Hopeless 0 0 0  PHQ - 2 Score 0 0 0  Altered sleeping   0  Tired, decreased energy   0  Change in appetite   0  Feeling bad or failure about yourself    0  Trouble concentrating   0  Moving slowly or fidgety/restless   0  Suicidal thoughts   0  PHQ-9 Score   0  Difficult doing work/chores   Not difficult at all    History Renee Maldonado has a past medical history of Anxiety, Arthritis, Cancer (HCC), COPD (chronic obstructive pulmonary disease) (HCC), GERD (gastroesophageal reflux disease), Humerus fracture (2007), Hyperlipidemia, Hypertension, Insomnia, Lung cancer (HCC), OA (osteoarthritis) of knee (10/08/2020), and Thyroid cancer (HCC) (2023).   She has a past surgical  history that includes Total knee arthroplasty; Left shoulder surgery; Joint replacement; Tubal ligation (1978); Breast surgery (Right, 01/2019); Breast lumpectomy (Right, 02/2019); Total knee arthroplasty (Right, 10/08/2020); Bronchial biopsy (04/15/2022); Bronchial needle aspiration biopsy (04/15/2022); Bronchial brushings (04/15/2022); Video bronchoscopy with radial endobronchial ultrasound (04/15/2022); Fiducial marker placement (04/15/2022); Colonoscopy (2022); Thyroidectomy (Right, 10/10/2022); Radical neck dissection (Right, 10/10/2022); and Esophagogastroduodenoscopy (egd) with propofol (N/A, 01/02/2023).   Her family history includes Alzheimer's disease in her mother; Cancer in her brother; Diabetes in her mother; Hypertension in her father; Stroke in her father.She reports that she quit smoking about 15 months ago. Her smoking use included cigarettes. She started smoking about 41 years ago. She has a 10 pack-year smoking history. She has never used smokeless tobacco. She reports current alcohol use of about 4.0 standard drinks of alcohol per week. She reports that she does not use drugs.    ROS Review of Systems  Constitutional: Negative.   HENT: Negative.    Eyes:  Negative for visual disturbance.  Respiratory:  Negative for shortness of breath.   Cardiovascular:  Negative for chest pain.  Gastrointestinal:  Negative for abdominal pain.  Musculoskeletal:  Negative for arthralgias.    Objective:  BP 138/76   Pulse 78   Temp 97.8 F (36.6 C)   Ht 5\' 1"  (1.549 m)   Wt 176 lb 12.8 oz (80.2 kg)   SpO2 93%   BMI 33.41 kg/m   BP Readings from  Last 3 Encounters:  10/12/23 138/76  08/20/23 130/67  08/05/23 125/65    Wt Readings from Last 3 Encounters:  10/12/23 176 lb 12.8 oz (80.2 kg)  08/20/23 177 lb 9.6 oz (80.6 kg)  08/07/23 176 lb (79.8 kg)     Physical Exam Constitutional:      General: She is not in acute distress.    Appearance: She is well-developed.  HENT:      Head: Normocephalic and atraumatic.  Eyes:     Conjunctiva/sclera: Conjunctivae normal.     Pupils: Pupils are equal, round, and reactive to light.  Neck:     Thyroid: No thyromegaly.  Cardiovascular:     Rate and Rhythm: Normal rate and regular rhythm.     Heart sounds: Normal heart sounds. No murmur heard. Pulmonary:     Effort: Pulmonary effort is normal. No respiratory distress.     Breath sounds: Normal breath sounds. No wheezing or rales.  Abdominal:     General: Bowel sounds are normal. There is no distension.     Palpations: Abdomen is soft.     Tenderness: There is no abdominal tenderness.  Musculoskeletal:        General: Normal range of motion.     Cervical back: Normal range of motion and neck supple.  Lymphadenopathy:     Cervical: No cervical adenopathy.  Skin:    General: Skin is warm and dry.  Neurological:     Mental Status: She is alert and oriented to person, place, and time.  Psychiatric:        Behavior: Behavior normal.        Thought Content: Thought content normal.        Judgment: Judgment normal.       Assessment & Plan:   Oveta was seen today for medical management of chronic issues.  Diagnoses and all orders for this visit:  Mixed hyperlipidemia -     Lipid panel -     Magnesium  Primary hypertension -     CBC with Differential/Platelet -     CMP14+EGFR -     Magnesium  Postoperative hypothyroidism -     TSH + free T4 -     Magnesium  Vitamin D deficiency -     VITAMIN D 25 Hydroxy (Vit-D Deficiency, Fractures)  Other orders -     levothyroxine (SYNTHROID) 75 MCG tablet; Take 1 tablet (75 mcg total) by mouth daily before breakfast. -     magnesium oxide (MAG-OX) 400 (240 Mg) MG tablet; Take 1 tablet (400 mg total) by mouth daily. -     potassium chloride (KLOR-CON 10) 10 MEQ tablet; Take 1 tablet (10 mEq total) by mouth daily.       I have discontinued Emoree Mclelland's ondansetron, diphenoxylate-atropine, predniSONE, and  azithromycin. I am also having her maintain her Restasis, anastrozole, Chlor-Trimeton, albuterol, hydrOXYzine, Calcium Carb-Cholecalciferol, cholecalciferol, cyanocobalamin, fluticasone furoate-vilanterol, Melatonin, L-Lysine, sodium chloride, Lubricant Eye Drops, valACYclovir, rosuvastatin, raloxifene, traZODone, hydrochlorothiazide, famotidine, amLODipine, alendronate, levothyroxine, magnesium oxide, and potassium chloride. We will continue to administer cyanocobalamin.  Allergies as of 10/12/2023       Reactions   Benadryl [diphenhydramine Hcl] Nausea Only   Dizziness   Penicillins    Had a rash around 20 years ago but unsure if it come from medicine.  Tolerated Cephalosporin Date: 10/09/20.        Medication List        Accurate as of October 12, 2023 11:02 AM. If you  have any questions, ask your nurse or doctor.          STOP taking these medications    azithromycin 250 MG tablet Commonly known as: Zithromax Z-Pak Stopped by: Verne Cove   diphenoxylate-atropine 2.5-0.025 MG tablet Commonly known as: Lomotil Stopped by: Coty Student   ondansetron 4 MG tablet Commonly known as: ZOFRAN Stopped by: Kena Limon   predniSONE 10 MG tablet Commonly known as: DELTASONE Stopped by: Jaquala Fuller       TAKE these medications    albuterol 108 (90 Base) MCG/ACT inhaler Commonly known as: VENTOLIN HFA 2 puffs in lungs every 6 hours as needed for wheezing/short of breath.   alendronate 70 MG tablet Commonly known as: FOSAMAX Take 1 tablet (70 mg total) by mouth every 7 (seven) days. Take with a full glass of water on an empty stomach. Do not lay down for at least 2 hours   amLODipine 5 MG tablet Commonly known as: NORVASC TAKE ONE TABLET ONCE DAILY   anastrozole 1 MG tablet Commonly known as: ARIMIDEX Take 1 mg by mouth in the morning.   Calcium Carb-Cholecalciferol 600-20 MG-MCG Tabs Take 1 tablet by mouth in the morning.   Chlor-Trimeton 4 MG  tablet Generic drug: chlorpheniramine Take 4 mg by mouth in the morning and at bedtime.   cholecalciferol 25 MCG (1000 UNIT) tablet Commonly known as: VITAMIN D3 Take 1,000 Units by mouth in the morning.   cyanocobalamin 1000 MCG/ML injection Commonly known as: VITAMIN B12 Inject 1 mL (1,000 mcg total) into the skin every 30 (thirty) days.   famotidine 40 MG tablet Commonly known as: PEPCID TAKE ONE TABLET ONCE DAILY   fluticasone furoate-vilanterol 200-25 MCG/ACT Aepb Commonly known as: Breo Ellipta Inhale 1 puff into the lungs daily. What changed:  when to take this reasons to take this   hydrochlorothiazide 25 MG tablet Commonly known as: HYDRODIURIL Take 25 mg by mouth daily.   hydrOXYzine 25 MG tablet Commonly known as: ATARAX Take 25 mg by mouth every 4 (four) hours as needed for itching.   L-Lysine 500 MG Tabs Take 500 mg by mouth at bedtime.   levothyroxine 75 MCG tablet Commonly known as: SYNTHROID Take 1 tablet (75 mcg total) by mouth daily before breakfast.   Lubricant Eye Drops 0.4-0.3 % Soln Generic drug: Polyethyl Glycol-Propyl Glycol Place 1-2 drops into both eyes 3 (three) times daily as needed (dry/irritated eyes.).   magnesium oxide 400 (240 Mg) MG tablet Commonly known as: MAG-OX Take 1 tablet (400 mg total) by mouth daily.   Melatonin 10 MG Caps Take 10 mg by mouth at bedtime.   potassium chloride 10 MEQ tablet Commonly known as: Klor-Con 10 Take 1 tablet (10 mEq total) by mouth daily.   raloxifene 60 MG tablet Commonly known as: EVISTA Take 1 tablet (60 mg total) by mouth daily.   Restasis 0.05 % ophthalmic emulsion Generic drug: cycloSPORINE Place 1 drop into both eyes 2 (two) times daily as needed (dry eyes).   rosuvastatin 20 MG tablet Commonly known as: CRESTOR Take 1 tablet (20 mg total) by mouth daily.   sodium chloride 0.65 % Soln nasal spray Commonly known as: OCEAN Place 1 spray into both nostrils as needed for  congestion.   traZODone 100 MG tablet Commonly known as: DESYREL Take 1 tablet (100 mg total) by mouth at bedtime.   valACYclovir 500 MG tablet Commonly known as: VALTREX Take 500 mg by mouth daily as needed (outbreaks).  Follow-up: No follow-ups on file.  [name], M.D.

## 2023-10-13 LAB — LIPID PANEL
Chol/HDL Ratio: 2.8 {ratio} (ref 0.0–4.4)
Cholesterol, Total: 134 mg/dL (ref 100–199)
HDL: 48 mg/dL (ref >39)
LDL Chol Calc (NIH): 58 mg/dL (ref 0–99)
Triglycerides: 165 mg/dL — ABNORMAL HIGH (ref 0–149)
VLDL Cholesterol Cal: 28 mg/dL (ref 5–40)

## 2023-10-13 LAB — CBC WITH DIFFERENTIAL/PLATELET
Basophils Absolute: 0 10*3/uL (ref 0.0–0.2)
Basos: 1 %
EOS (ABSOLUTE): 0.2 10*3/uL (ref 0.0–0.4)
Eos: 3 %
Hematocrit: 39.6 % (ref 34.0–46.6)
Hemoglobin: 12.3 g/dL (ref 11.1–15.9)
Immature Grans (Abs): 0 10*3/uL (ref 0.0–0.1)
Immature Granulocytes: 1 %
Lymphocytes Absolute: 1.3 10*3/uL (ref 0.7–3.1)
Lymphs: 22 %
MCH: 26.5 pg — ABNORMAL LOW (ref 26.6–33.0)
MCHC: 31.1 g/dL — ABNORMAL LOW (ref 31.5–35.7)
MCV: 85 fL (ref 79–97)
Monocytes Absolute: 0.5 10*3/uL (ref 0.1–0.9)
Monocytes: 8 %
Neutrophils Absolute: 4.1 10*3/uL (ref 1.4–7.0)
Neutrophils: 65 %
Platelets: 237 10*3/uL (ref 150–450)
RBC: 4.64 x10E6/uL (ref 3.77–5.28)
RDW: 14.6 % (ref 11.7–15.4)
WBC: 6.1 10*3/uL (ref 3.4–10.8)

## 2023-10-13 LAB — CMP14+EGFR
ALT: 10 [IU]/L (ref 0–32)
AST: 15 [IU]/L (ref 0–40)
Albumin: 4.5 g/dL (ref 3.8–4.8)
Alkaline Phosphatase: 67 [IU]/L (ref 44–121)
BUN/Creatinine Ratio: 20 (ref 12–28)
BUN: 31 mg/dL — ABNORMAL HIGH (ref 8–27)
Bilirubin Total: 0.3 mg/dL (ref 0.0–1.2)
CO2: 25 mmol/L (ref 20–29)
Calcium: 10.3 mg/dL (ref 8.7–10.3)
Chloride: 101 mmol/L (ref 96–106)
Creatinine, Ser: 1.58 mg/dL — ABNORMAL HIGH (ref 0.57–1.00)
Globulin, Total: 2 g/dL (ref 1.5–4.5)
Glucose: 86 mg/dL (ref 70–99)
Potassium: 4.5 mmol/L (ref 3.5–5.2)
Sodium: 143 mmol/L (ref 134–144)
Total Protein: 6.5 g/dL (ref 6.0–8.5)
eGFR: 33 mL/min/{1.73_m2} — ABNORMAL LOW (ref >59)

## 2023-10-13 LAB — MAGNESIUM: Magnesium: 2 mg/dL (ref 1.6–2.3)

## 2023-10-13 LAB — TSH+FREE T4
Free T4: 1.69 ng/dL (ref 0.82–1.77)
TSH: 1.79 u[IU]/mL (ref 0.450–4.500)

## 2023-10-13 LAB — VITAMIN D 25 HYDROXY (VIT D DEFICIENCY, FRACTURES): Vit D, 25-Hydroxy: 50.9 ng/mL (ref 30.0–100.0)

## 2023-10-28 DIAGNOSIS — R928 Other abnormal and inconclusive findings on diagnostic imaging of breast: Secondary | ICD-10-CM | POA: Diagnosis not present

## 2023-10-28 DIAGNOSIS — N6489 Other specified disorders of breast: Secondary | ICD-10-CM | POA: Diagnosis not present

## 2023-10-28 DIAGNOSIS — N6012 Diffuse cystic mastopathy of left breast: Secondary | ICD-10-CM | POA: Diagnosis not present

## 2023-10-28 DIAGNOSIS — R92322 Mammographic fibroglandular density, left breast: Secondary | ICD-10-CM | POA: Diagnosis not present

## 2023-11-04 ENCOUNTER — Ambulatory Visit (INDEPENDENT_AMBULATORY_CARE_PROVIDER_SITE_OTHER): Payer: Medicare HMO

## 2023-11-04 DIAGNOSIS — E538 Deficiency of other specified B group vitamins: Secondary | ICD-10-CM

## 2023-11-04 NOTE — Progress Notes (Signed)
Patient is in office today for a nurse visit for B12 Injection. Patient Injection was given in the  Right deltoid. Patient tolerated injection well.

## 2023-11-30 ENCOUNTER — Other Ambulatory Visit: Payer: Self-pay | Admitting: Family Medicine

## 2023-12-03 ENCOUNTER — Other Ambulatory Visit: Payer: Self-pay | Admitting: Urology

## 2023-12-03 DIAGNOSIS — C3491 Malignant neoplasm of unspecified part of right bronchus or lung: Secondary | ICD-10-CM

## 2023-12-07 ENCOUNTER — Ambulatory Visit (INDEPENDENT_AMBULATORY_CARE_PROVIDER_SITE_OTHER): Payer: Medicare HMO | Admitting: *Deleted

## 2023-12-07 DIAGNOSIS — E538 Deficiency of other specified B group vitamins: Secondary | ICD-10-CM

## 2023-12-07 NOTE — Progress Notes (Signed)
Patient is in office today for a nurse visit for B12 Injection. Patient Injection was given in the  Left deltoid. Patient tolerated injection well.

## 2023-12-10 ENCOUNTER — Encounter: Payer: Self-pay | Admitting: Gastroenterology

## 2023-12-22 IMAGING — CT CT CERVICAL SPINE W/O CM
3 series · 11 of 33 positions shown, 13 images · non-contrast
Comparison: None.

CLINICAL DATA: Fell, hit head, posterior scalp hematoma



[Series 4: c spine soft · axial · 0.32mm/px · z∈[-224,-134]mm · 3 of 74 slices shown, 4 images]
[im 17/74  soft-tissue]
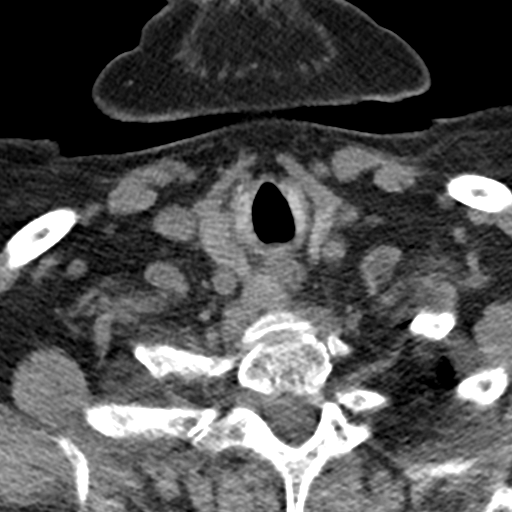
[im 17/74  bone]
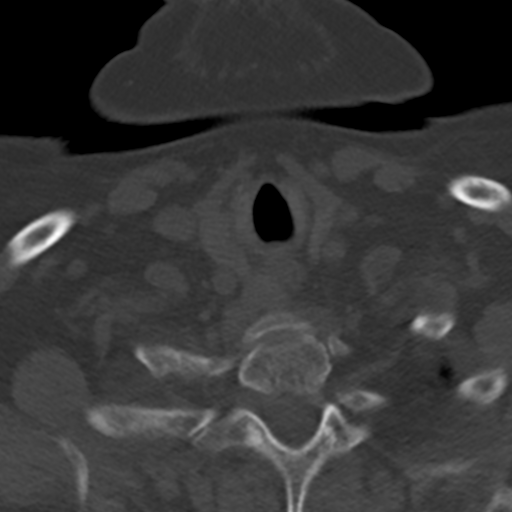
[im 40/74  bone]
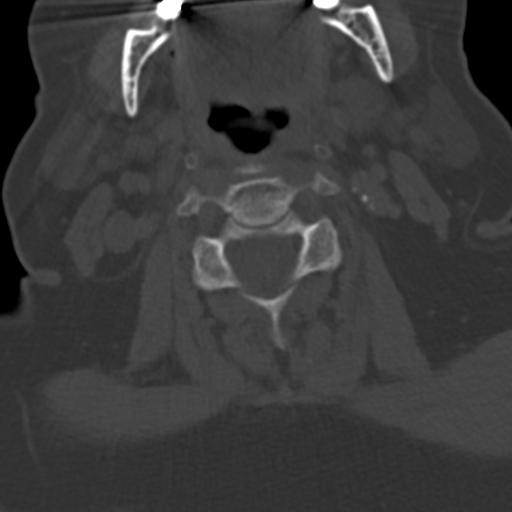
[im 62/74  bone]
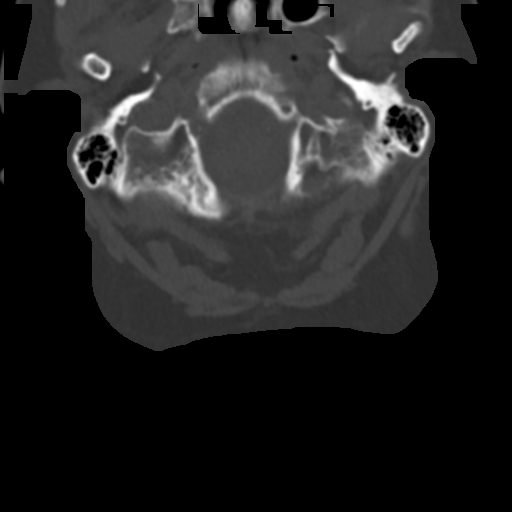

[Series 5: sagittal bone · sagittal · 0.24mm/px · 5 of 61 slices shown, 6 images]
[im 21/61  bone]
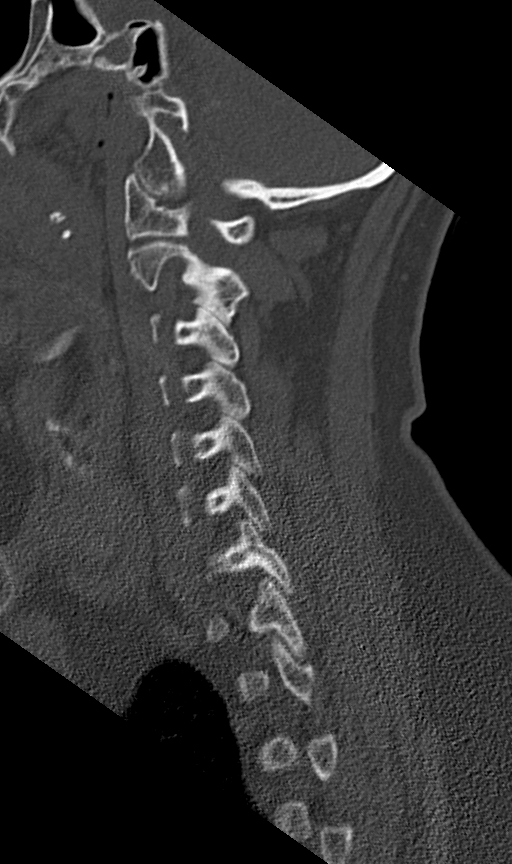
[im 26/61  bone]
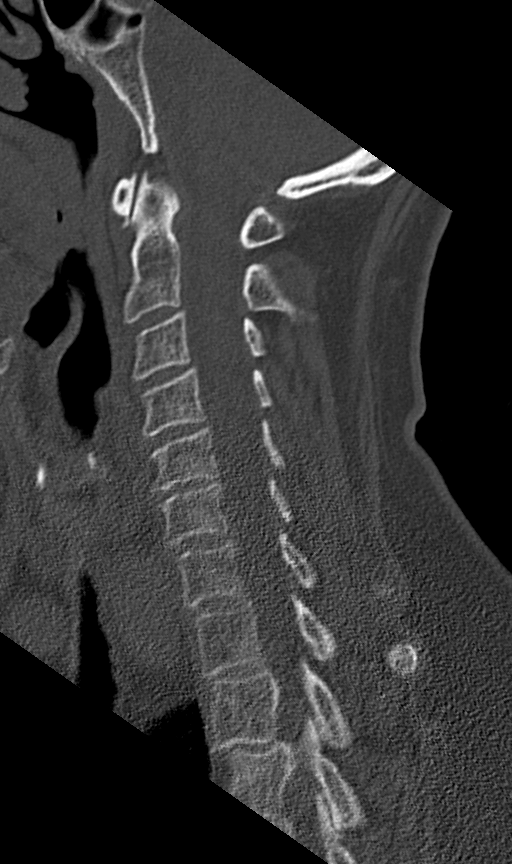
[im 31/61  soft-tissue]
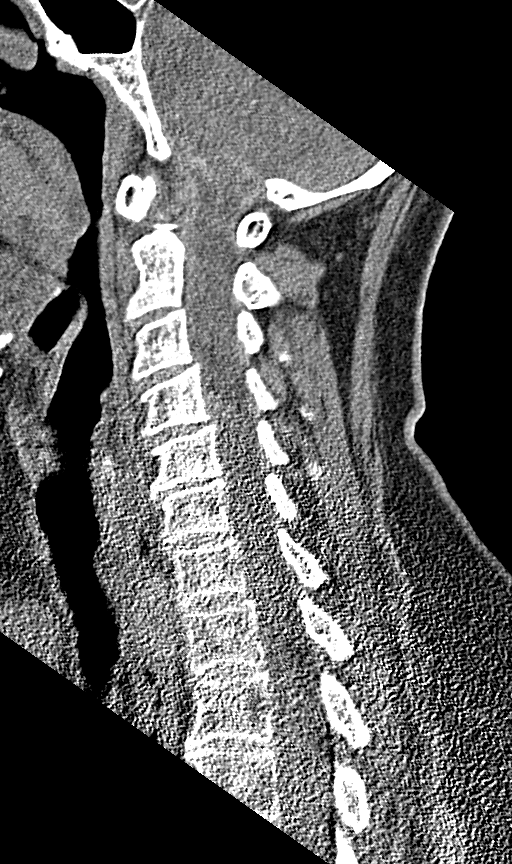
[im 31/61  bone]
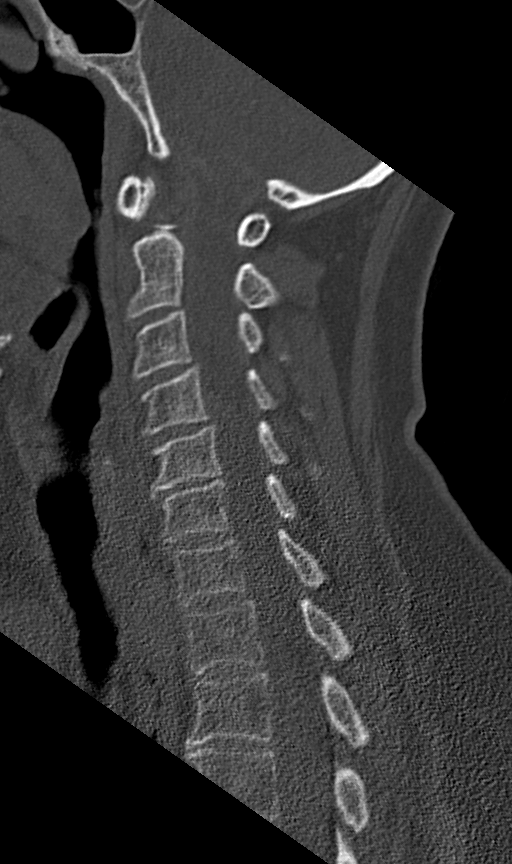
[im 36/61  bone]
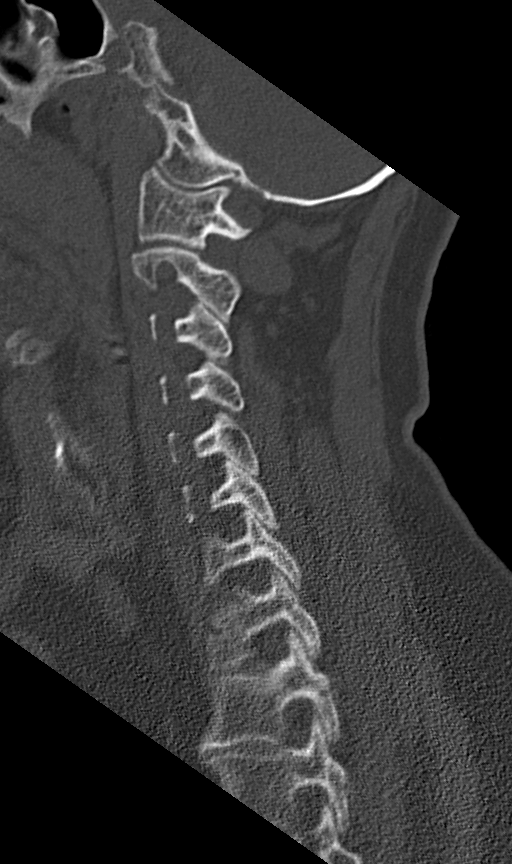
[im 41/61  bone]
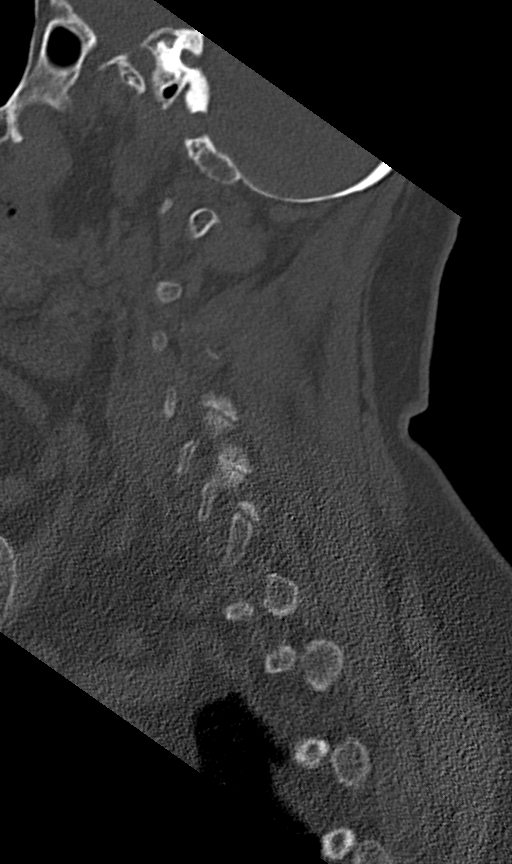

[Series 6: coronal bone · coronal · 0.27mm/px · 3 of 65 slices shown]
[im 20/65  bone]
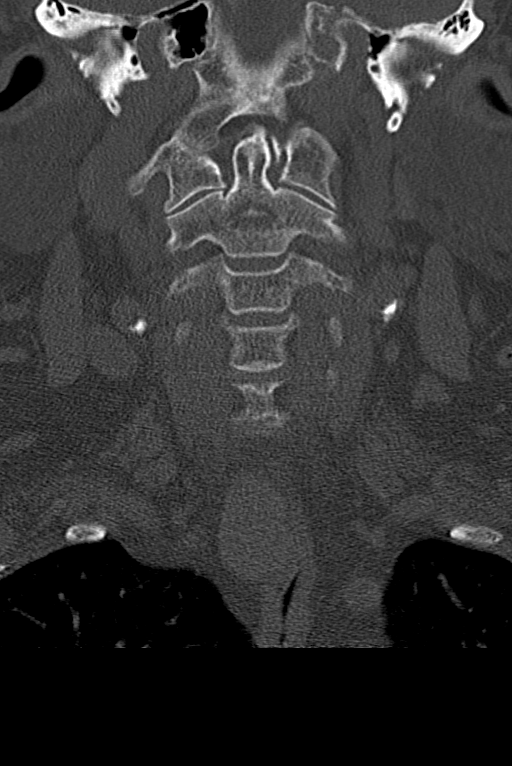
[im 28/65  bone]
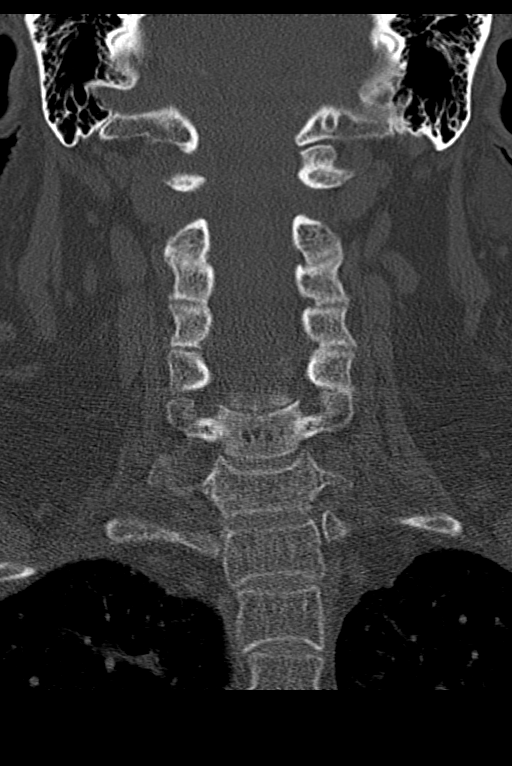
[im 37/65  bone]
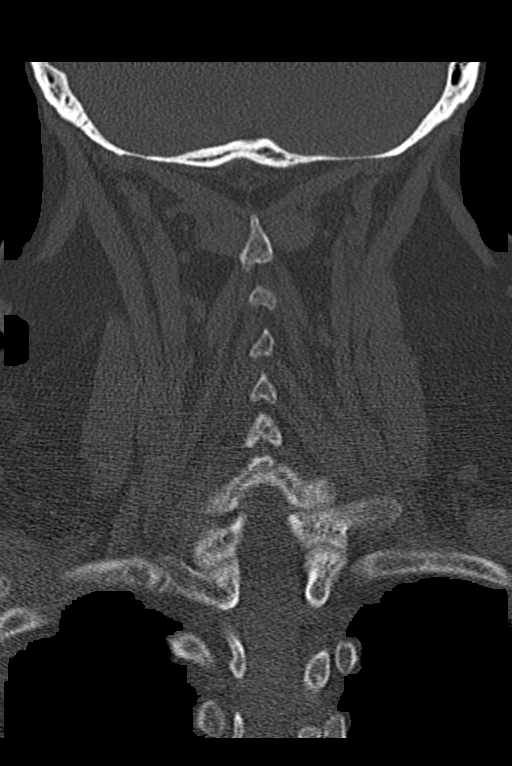

[11 of 33 positions shown; findings below may reference images not displayed]

FINDINGS: Alignment: Alignment is anatomic.

Skull base and vertebrae: No acute fracture. No primary bone lesion
or focal pathologic process.

Soft tissues and spinal canal: No prevertebral fluid or swelling. No
visible canal hematoma.

Disc levels: Mild cervical spondylosis most pronounced at C5-6. No
significant bony encroachment upon the central canal or neural
foramina.

Upper chest: Airway is patent. Curvilinear somewhat spiculated
density within the right apex measures 1.1 x 0.8 by 0.9 cm. This may
extend to the pleural surface and could reflect scarring, though is
incompletely evaluated on this study. Recommend follow-up unenhanced
chest CT for complete characterization.

2.1 x 2.0 by 2.6 cm nodule in the right paratracheal region has
similar attenuation to the thyroid and appears contiguous with the
right lobe, consistent with exophytic thyroid nodule. Follow-up
thyroid ultrasound may be useful for further evaluation.

Other: Reconstructed images demonstrate no additional findings.
IMPRESSION: 1. No acute cervical spine fracture.
2. 10 mm suspicious right solid pulmonary nodule within the upper
lobe detected on incomplete chest CT. Recommend non-contrast Chest
CT for further evaluation.
These guidelines do not apply to immunocompromised patients and
patients with cancer. Follow up in patients with significant
comorbidities as clinically warranted. For lung cancer screening,
adhere to Lung-RADS guidelines. Reference: Radiology. 8961;
284(1):228-43.
3. 2.6 cm exophytic right lobe thyroid nodule. Recommend nonemergent
follow-up thyroid US if this has not been previously evaluated.
(Ref: [HOSPITAL]. [DATE]): 143-50).

## 2023-12-31 ENCOUNTER — Ambulatory Visit: Payer: Medicare HMO | Admitting: Nurse Practitioner

## 2023-12-31 ENCOUNTER — Ambulatory Visit (INDEPENDENT_AMBULATORY_CARE_PROVIDER_SITE_OTHER): Payer: Medicare HMO

## 2023-12-31 ENCOUNTER — Encounter: Payer: Self-pay | Admitting: Nurse Practitioner

## 2023-12-31 ENCOUNTER — Ambulatory Visit: Payer: Self-pay | Admitting: Family Medicine

## 2023-12-31 VITALS — BP 133/83 | HR 86 | Temp 98.0°F | Ht 61.0 in | Wt 180.0 lb

## 2023-12-31 DIAGNOSIS — J40 Bronchitis, not specified as acute or chronic: Secondary | ICD-10-CM | POA: Diagnosis not present

## 2023-12-31 DIAGNOSIS — R062 Wheezing: Secondary | ICD-10-CM

## 2023-12-31 DIAGNOSIS — R059 Cough, unspecified: Secondary | ICD-10-CM | POA: Diagnosis not present

## 2023-12-31 DIAGNOSIS — I700 Atherosclerosis of aorta: Secondary | ICD-10-CM | POA: Diagnosis not present

## 2023-12-31 MED ORDER — PREDNISONE 10 MG (21) PO TBPK: ORAL_TABLET | ORAL | 0 refills | Status: DC

## 2023-12-31 MED ORDER — GUAIFENESIN 400 MG PO TABS: 400.0000 mg | ORAL_TABLET | Freq: Four times a day (QID) | ORAL | 0 refills | Status: DC | PRN

## 2023-12-31 MED ORDER — DOXYCYCLINE HYCLATE 100 MG PO CAPS: 100.0000 mg | ORAL_CAPSULE | Freq: Two times a day (BID) | ORAL | 0 refills | Status: DC

## 2023-12-31 NOTE — Telephone Encounter (Signed)
Chief Complaint: suspected bronchitis Symptoms: cough, SOB, low grade fever, headache, nasal congestion Frequency: since Monday Pertinent Negatives: Patient denies sore throat Disposition: [] ED /[] Urgent Care (no appt availability in office) / [x] Appointment(In office/virtual)/ []  [facility]/ [] Home Care/ [] Refused Recommended Disposition /[] [facility]/ []  Follow-up with PCP Additional Notes: Patient called in stating she is feeling very under the weather and has symptoms of a productive cough, mild shortness of breath, headache, congestion. Patient's husband was seen by Dr. Darlyn Read yesterday and diagnosed with Bronchitis and received a cortisone shot and abx. Patient was also in contact with grandson earlier this week who tested positive for the flu. Patient advised to wear mask to appt today.    Copied from CRM [id number]. Topic: Clinical - Red Word Triage >> Dec 31, 2023 10:21 AM Dennison Nancy wrote: Red Word that prompted transfer to Nurse Triage: constantly coughing , headache ,stuffy nose ,clear congestion , dry throat Reason for Disposition  [1] MILD difficulty breathing (e.g., minimal/no SOB at rest, SOB with walking, pulse <100) AND [2] still present when not coughing  Answer Assessment - Initial Assessment Questions 1. ONSET: "When did the cough begin?"      Began Monday 2. SEVERITY: "How bad is the cough today?"      Cough attacks 3. SPUTUM: "Describe the color of your sputum" (none, dry cough; clear, white, yellow, green)     Clear 4. HEMOPTYSIS: "Are you coughing up any blood?" If so ask: "How much?" (flecks, streaks, tablespoons, etc.)     No 5. DIFFICULTY BREATHING: "Are you having difficulty breathing?" If Yes, ask: "How bad is it?" (e.g., mild, moderate, severe)    - MILD: No SOB at rest, mild SOB with walking, speaks normally in sentences, can lie down, no retractions, pulse < 100.    - MODERATE: SOB at rest, SOB with minimal exertion and prefers to sit,  cannot lie down flat, speaks in phrases, mild retractions, audible wheezing, pulse 100-120.    - SEVERE: Very SOB at rest, speaks in single words, struggling to breathe, sitting hunched forward, retractions, pulse > 120      SOB with coughing and exertion 6. FEVER: "Do you have a fever?" If Yes, ask: "What is your temperature, how was it measured, and when did it start?"     A low grade fever 7. CARDIAC HISTORY: "Do you have any history of heart disease?" (e.g., heart attack, congestive heart failure)      no 8. LUNG HISTORY: "Do you have any history of lung disease?"  (e.g., pulmonary embolus, asthma, emphysema)     Prior lung cancer 9. PE RISK FACTORS: "Do you have a history of blood clots?" (or: recent major surgery, recent prolonged travel, bedridden)     No 10. OTHER SYMPTOMS: "Do you have any other symptoms?" (e.g., runny nose, wheezing, chest pain)       Coughing with clear phlegm, headache, congestion, runny nose, throat scratchy  Protocols used: Cough - Acute Productive-A-AH

## 2023-12-31 NOTE — Progress Notes (Signed)
Acute Office Visit  Subjective:     Patient ID: Renee Maldonado, female    DOB: 05/05/1946, 78 y.o.   MRN: [id number]  Chief Complaint  Patient presents with   Cough    Started monday   Wheezing   Nasal Congestion   Renee Maldonado is a 78 year old-year-old female present December 31, 2023 for an acute visit concern for URI symptoms.  She reports her husband was diagnosed with bronchitis 2 days ago and she has been experiencing the same symptoms since yesterday "I wanted to wait the weekend to see if I get better but I am not getting any better 1. Cough Patient reports a wheezing, nsal congestion history of productive cough "clear".  It is worse with all day and improved by nothing.  Reports chest congestion, nasal congestion, rhinorrhea,, shortness of breath, wheeze Denies  hemoptysis, chest pain, post tussive emesis, nausea, diarrhea, fever, or chills.  Patient had  sick contacts, No recent travel.  history pulmonary disease including asthma or COPD. Cough Associated symptoms include shortness of breath and wheezing. Pertinent negatives include no chest pain, chills, ear pain, fever, headaches, hemoptysis, myalgias or sore throat. There is no history of environmental allergies.  Wheezing  Associated symptoms include coughing, shortness of breath and sputum production. Pertinent negatives include no chest pain, chills, diarrhea, ear pain, fever, headaches, hemoptysis, sore throat or vomiting.      Active Ambulatory Problems    Diagnosis Date Noted   Hyperlipidemia 06/29/2013   Hypertension 06/29/2013   GAD (generalized anxiety disorder) 06/29/2013   Diverticulosis of colon without hemorrhage 06/29/2013   GERD (gastroesophageal reflux disease) 06/29/2013   Insomnia 06/29/2013   Osteopenia 06/29/2013   Fall 12/19/2013   COPD (chronic obstructive pulmonary disease) (HCC) 08/11/2014   Vitamin D deficiency 03/08/2015   Primary osteoarthritis of right knee 10/08/2020   Age-related  nuclear cataract, bilateral 03/01/2018   Epiretinal membrane 02/23/2013   Malignant neoplasm of lower-inner quadrant of right breast of female, estrogen receptor positive (HCC) 02/23/2019   Nuclear cataract 02/23/2013   Pain in right knee 07/25/2020   Presence of unspecified artificial knee joint 10/06/2011   Rotator cuff syndrome of left shoulder 07/11/2013   Torn rotator cuff 08/31/2013   Positive self-administered antigen test for COVID-19 10/28/2021   Papillary thyroid carcinoma (HCC) 03/17/2022   Multiple lung nodules 03/26/2022   Ground glass opacity present on imaging of lung 03/26/2022   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) 04/23/2022   Hypokalemia 11/01/2022   Diarrhea 11/05/2022   Gastroenteritis 11/05/2022   AKI (acute kidney injury) (HCC) 11/05/2022   Perianal abscess 11/12/2022   Anal fissure 11/13/2022   Postoperative hypothyroidism 08/05/2023   Bronchitis 12/31/2023   Cough in adult 12/31/2023   Wheezing 12/31/2023   Resolved Ambulatory Problems    Diagnosis Date Noted   Heme positive stool 09/14/2013   Cellulitis of knee, right 12/19/2013   Abrasion of knee, right 12/19/2013   Morbid obesity (HCC) 03/08/2015   Initial Medicare annual wellness visit 03/20/2015   OA (osteoarthritis) of knee 10/08/2020   Past Medical History:  Diagnosis Date   Anxiety    Arthritis    Cancer (HCC)    Humerus fracture 2007   Lung cancer (HCC)    Thyroid cancer (HCC) 2023    Review of Systems  Constitutional:  Positive for malaise/fatigue. Negative for chills and fever.  HENT:  Positive for congestion. Negative for ear discharge, ear pain and sore throat.   Respiratory:  Positive for cough, sputum production, shortness of breath and wheezing. Negative for hemoptysis.        Clear sputum  Cardiovascular:  Negative for chest pain and leg swelling.  Gastrointestinal:  Negative for diarrhea, nausea and vomiting.  Musculoskeletal:  Negative for myalgias.   Neurological:  Negative for dizziness and headaches.  Endo/Heme/Allergies:  Negative for environmental allergies.   Negative unless indicated in HPI    Objective:    BP 133/83   Pulse 86   Temp 98 F (36.7 C)   Ht 5\' 1"  (1.549 m)   Wt 180 lb (81.6 kg)   SpO2 96%   BMI 34.01 kg/m  BP Readings from Last 3 Encounters:  12/31/23 133/83  10/12/23 138/76  08/20/23 130/67   Wt Readings from Last 3 Encounters:  12/31/23 180 lb (81.6 kg)  10/12/23 176 lb 12.8 oz (80.2 kg)  08/20/23 177 lb 9.6 oz (80.6 kg)     Physical Exam Vitals and nursing note reviewed.  Constitutional:      Appearance: She is obese. She is ill-appearing.  HENT:     Head: Normocephalic and atraumatic.     Right Ear: Tympanic membrane, ear canal and external ear normal. There is no impacted cerumen.     Left Ear: Tympanic membrane, ear canal and external ear normal. There is impacted cerumen.     Nose: Congestion present.     Mouth/Throat:     Mouth: Mucous membranes are moist.  Eyes:     General: No scleral icterus.    Extraocular Movements: Extraocular movements intact.     Conjunctiva/sclera: Conjunctivae normal.     Pupils: Pupils are equal, round, and reactive to light.  Cardiovascular:     Heart sounds: Normal heart sounds.  Pulmonary:     Breath sounds: Wheezing present.  Musculoskeletal:        General: Normal range of motion.     Right lower leg: No edema.     Left lower leg: No edema.  Skin:    General: Skin is warm and dry.     Findings: No rash.  Neurological:     Mental Status: She is alert and oriented to person, place, and time.  Psychiatric:        Mood and Affect: Mood normal.        Behavior: Behavior normal.        Thought Content: Thought content normal.        Judgment: Judgment normal.       No results found for any visits on 12/31/23.      Assessment & Plan:  Wheezing -     DG Chest 2 View -     predniSONE; Use as directed on back of pill pack  Dispense: 21  tablet; Refill: 0 -     Doxycycline Hyclate; Take 1 capsule (100 mg total) by mouth 2 (two) times daily.  Dispense: 14 capsule; Refill: 0  Cough in adult -     DG Chest 2 View -     predniSONE; Use as directed on back of pill pack  Dispense: 21 tablet; Refill: 0 -     guaiFENesin; Take 1 tablet (400 mg total) by mouth every 6 (six) hours as needed.  Dispense: 30 tablet; Refill: 0  Bronchitis -     predniSONE; Use as directed on back of pill pack  Dispense: 21 tablet; Refill: 0 -     guaiFENesin; Take 1 tablet (400 mg total) by mouth every  6 (six) hours as needed.  Dispense: 30 tablet; Refill: 0 -     Doxycycline Hyclate; Take 1 capsule (100 mg total) by mouth 2 (two) times daily.  Dispense: 14 capsule; Refill: 0   Rom[place] 78 year old female seen today for bronchitis, no acute distress X-ray ordered final reading from radiologist pending Bronchitis will check central doxycycline 100 mg twice daily for 7 days, guaifenesin 400 mg 1 tablet every 6 hours for cough, prednisone 10 mg #21 dispensed client to follow instruction on the box; continue using already prescribed Ventolin Increase hydration, rest  Encourage healthy lifestyle choices, including diet (rich in fruits, vegetables, and lean proteins, and low in salt and simple carbohydrates) and exercise (at least 30 minutes of moderate physical activity daily).     The above assessment and management plan was discussed with the patient. The patient verbalized understanding of and has agreed to the management plan. Patient is aware to call the clinic if they develop any new symptoms or if symptoms persist or worsen. Patient is aware when to return to the clinic for a follow-up visit. Patient educated on when it is appropriate to go to the emergency department.  Return if symptoms worsen or fail to improve.  Arrie Aran Santa Lighter, [facility] [address] [place], [place] [address] [phone]  Note: This  document was prepared by [employer] voice dictation technology and any errors that results from this process are unintentional.

## 2024-01-05 ENCOUNTER — Encounter: Payer: Self-pay | Admitting: Gastroenterology

## 2024-01-05 ENCOUNTER — Other Ambulatory Visit: Payer: Self-pay | Admitting: Gastroenterology

## 2024-01-05 ENCOUNTER — Ambulatory Visit: Payer: Medicare HMO | Admitting: Gastroenterology

## 2024-01-05 ENCOUNTER — Telehealth: Payer: Self-pay | Admitting: *Deleted

## 2024-01-05 VITALS — BP 110/69 | HR 82 | Temp 98.4°F | Ht 61.0 in | Wt 180.2 lb

## 2024-01-05 DIAGNOSIS — K5900 Constipation, unspecified: Secondary | ICD-10-CM | POA: Diagnosis not present

## 2024-01-05 DIAGNOSIS — F1090 Alcohol use, unspecified, uncomplicated: Secondary | ICD-10-CM | POA: Diagnosis not present

## 2024-01-05 DIAGNOSIS — K7460 Unspecified cirrhosis of liver: Secondary | ICD-10-CM | POA: Diagnosis not present

## 2024-01-05 MED ORDER — LACTULOSE 10 GM/15ML PO SOLN: 10.0000 g | Freq: Every day | ORAL | 0 refills | Status: DC

## 2024-01-05 NOTE — Progress Notes (Signed)
 GI Office Note    Referring Provider: [name], MD Primary Care Physician:  [name], MD Primary Gastroenterologist: Hennie Duos. Marletta Lor, DO  Date:  01/05/2024  ID:  Renee Maldonado, DOB [birth year], MRN [id number]   Chief Complaint   Chief Complaint  Patient presents with   Follow-up    Follow up on cirrhosis of liver   History of Present Illness  Renee Maldonado is a 78 y.o. female with a history of anxiety, breast cancer, arthritis, COPD, CKD, GERD, HLD, HTN, OA, thyroid cancer, lung cancer, recently died of cirrhosis presenting today for follow-up  Last colonoscopy April 2022 at [facility]: Unable to review report. (Patient has previously stated she would not have another colonoscopy)   Admitted to hospital December 2023 with diarrhea x 1 week.  CT with findings consistent with ileus/enteritis, cirrhosis with mild steatosis, and 1 cm right lower lobe groundglass nodule, small hiatal hernia, and small inguinal fat hernia.  Stools positive for adenovirus and she was treated supportively with Imodium and cholestyramine.  Developed rectal pain during admission with concern for perianal abscess.  General surgery was consulted and found to have perianal ulceration at 6 o'clock position with recommendations for supportive treatment with Anusol suppositories and hemorrhoid cream compounded with nitroglycerin on discharge.   Seen by general surgery at the end of January and reported improvement in rectal pain with compounded hemorrhoid cream.  Per notes her exam noted that her ulceration had almost completely healed.   Last office visit 12/18/2022.  Denied any jaundice, pruritus, bleeding, encephalopathy or ascites.  Did report 2 episodes of scant toilet tissue hematochezia.  Reportedly drinking 4 ounces of liquor per day when she drinks.  May drink daily for a week and then nothing for months.  Quit smoking in August/September 2023.  MELD labs as well as other labs ordered to assess for etiology  of cirrhosis.  Right upper quadrant ultrasound advised for June 2024.  Scheduled for EGD.   Labs in February negative for hepatitis A, B, or C.  No immunity to hepatitis A or B.  AFP within normal limits.  Negative ANA, ceruloplasmin, ASMA, AMA, ANA.  Ferritin mildly elevated at 183.  No elevation in immunoglobulins  Last office visit 06/25/2023.  Denied any ascites, abdominal pain, coffee-ground emesis, encephalopathy.  Taking HCTZ.  Denies any melena or bright BPR.  Trying to work out cutting out carbs and making better choices but still struggling.  Denied lack of appetite or weight loss.  CBC and INR ordered as well as frontal quadrant ultrasound.  Advised to avoid alcohol and NSAIDs, follow high-protein diet and 2 g sodium diet.  Follow-up 6 months    Today:  Cirrhosis history Hematemesis/coffee ground emesis: none History of variceal bleeding: none Abdominal pain: none Abdominal distention/worsening ascites: mild peripheral edema when wearing socks, nothing with her abdomen.  Fever/chills: none Episodes of confusion/disorientation: none Number of daily bowel movements:  Taking diuretics?: HCTZ Date of last EGD: February 2024 -no varices.  Repeat 3 years. Prior history of banding?:  Prior episodes of SBP: none Last time liver imaging was performed: August 2024 -no evidence of hepatoma, increased echotexture of the liver.  Normal portal flow.  MELD score: 12 on las August 2024  Taking antibiotics and cough syrup for her bronchitis. Husband is also dealing with some bronchitis. Has not had fevers. Get stuffy and coughing and wheezy at night.   Has been trying to refrain from alcohol as much as possible. Has had maybe one  drink since we discovered her cirrhosis. Had a drink with her sister at her birthday (LIT), none since then.      Latest Ref Rng & Units 10/12/2023   11:10 AM 06/15/2023   10:48 AM 04/15/2023   11:25 AM  CMP  Glucose 70 - 99 mg/dL 86  161  096   BUN 8 - 27 mg/dL  31  24  21    Creatinine 0.57 - 1.00 mg/dL 0.45  4.09  8.11   Sodium 134 - 144 mmol/L 143  138  139   Potassium 3.5 - 5.2 mmol/L 4.5  4.3  4.7   Chloride 96 - 106 mmol/L 101  101  100   CO2 20 - 29 mmol/L 25  23  25    Calcium 8.7 - 10.3 mg/dL 91.4  78.2  95.6   Total Protein 6.0 - 8.5 g/dL 6.5  6.8  6.9   Total Bilirubin 0.0 - 1.2 mg/dL 0.3  0.5  0.5   Alkaline Phos 44 - 121 IU/L 67  73  73   AST 0 - 40 IU/L 15  18  16    ALT 0 - 32 IU/L 10  15  12       Wt Readings from Last 3 Encounters:  01/05/24 180 lb 3.2 oz (81.7 kg)  12/31/23 180 lb (81.6 kg)  10/12/23 176 lb 12.8 oz (80.2 kg)    Current Outpatient Medications  Medication Sig Dispense Refill   albuterol (VENTOLIN HFA) 108 (90 Base) MCG/ACT inhaler 2 puffs in lungs every 6 hours as needed for wheezing/short of breath. 8.5 g 5   alendronate (FOSAMAX) 70 MG tablet Take 1 tablet (70 mg total) by mouth every 7 (seven) days. Take with a full glass of water on an empty stomach. Do not lay down for at least 2 hours 13 tablet 3   amLODipine (NORVASC) 5 MG tablet TAKE ONE TABLET ONCE DAILY 90 tablet 1   anastrozole (ARIMIDEX) 1 MG tablet Take 1 mg by mouth in the morning.     Calcium Carb-Cholecalciferol 600-20 MG-MCG TABS Take 1 tablet by mouth in the morning.     chlorpheniramine (CHLOR-TRIMETON) 4 MG tablet Take 4 mg by mouth in the morning and at bedtime.     cholecalciferol (VITAMIN D3) 25 MCG (1000 UNIT) tablet Take 1,000 Units by mouth in the morning.     cyanocobalamin (VITAMIN B12) 1000 MCG/ML injection Inject 1 mL (1,000 mcg total) into the skin every 30 (thirty) days. 1 mL 11   doxycycline (VIBRAMYCIN) 100 MG capsule Take 1 capsule (100 mg total) by mouth 2 (two) times daily. 14 capsule 0   famotidine (PEPCID) 40 MG tablet TAKE ONE TABLET ONCE DAILY 90 tablet 1   fluticasone furoate-vilanterol (BREO ELLIPTA) 200-25 MCG/ACT AEPB Inhale 1 puff into the lungs daily. (Patient taking differently: Inhale 1 puff into the lungs daily as  needed (shortness of breath).) 1 each 11   guaifenesin (HUMIBID E) 400 MG TABS tablet Take 1 tablet (400 mg total) by mouth every 6 (six) hours as needed. 30 tablet 0   hydrochlorothiazide (HYDRODIURIL) 25 MG tablet TAKE ONE TABLET ONCE DAILY 90 tablet 1   hydrOXYzine (ATARAX/VISTARIL) 25 MG tablet Take 25 mg by mouth every 4 (four) hours as needed for itching.     L-Lysine 500 MG TABS Take 500 mg by mouth at bedtime.     levothyroxine (SYNTHROID) 75 MCG tablet Take 1 tablet (75 mcg total) by mouth daily before breakfast. 90 tablet  1   magnesium oxide (MAG-OX) 400 (240 Mg) MG tablet Take 1 tablet (400 mg total) by mouth daily. 90 tablet 3   Melatonin 10 MG CAPS Take 10 mg by mouth at bedtime.     Polyethyl Glycol-Propyl Glycol (LUBRICANT EYE DROPS) 0.4-0.3 % SOLN Place 1-2 drops into both eyes 3 (three) times daily as needed (dry/irritated eyes.).     potassium chloride (KLOR-CON 10) 10 MEQ tablet Take 1 tablet (10 mEq total) by mouth daily. 180 tablet 3   predniSONE (STERAPRED UNI-PAK 21 TAB) 10 MG (21) TBPK tablet Use as directed on back of pill pack 21 tablet 0   raloxifene (EVISTA) 60 MG tablet Take 1 tablet (60 mg total) by mouth daily. 90 tablet 2   RESTASIS 0.05 % ophthalmic emulsion Place 1 drop into both eyes 2 (two) times daily as needed (dry eyes).      rosuvastatin (CRESTOR) 20 MG tablet Take 1 tablet (20 mg total) by mouth daily. 90 tablet 3   sodium chloride (OCEAN) 0.65 % SOLN nasal spray Place 1 spray into both nostrils as needed for congestion.     traZODone (DESYREL) 100 MG tablet Take 1 tablet (100 mg total) by mouth at bedtime. 90 tablet 3   valACYclovir (VALTREX) 500 MG tablet Take 500 mg by mouth daily as needed (outbreaks).     No current facility-administered medications for this visit.    Past Medical History:  Diagnosis Date   Anxiety    Arthritis    Cancer (HCC)    breast Right no chemo or radiation  on Arimidex   COPD (chronic obstructive pulmonary disease)  (HCC)    on Breo   GERD (gastroesophageal reflux disease)    Humerus fracture 2007   Hyperlipidemia    Hypertension    Insomnia    Lung cancer (HCC)    OA (osteoarthritis) of knee 10/08/2020   Thyroid cancer (HCC) 2023    Past Surgical History:  Procedure Laterality Date   BREAST LUMPECTOMY Right 02/2019   BREAST SURGERY Right 01/2019   breast biopsy   BRONCHIAL BIOPSY  04/15/2022   Procedure: BRONCHIAL BIOPSIES;  Surgeon: [name], DO;  Location: [facility];  Service: Pulmonary;;   BRONCHIAL BRUSHINGS  04/15/2022   Procedure: BRONCHIAL BRUSHINGS;  Surgeon: [name], DO;  Location: [facility];  Service: Pulmonary;;   BRONCHIAL NEEDLE ASPIRATION BIOPSY  04/15/2022   Procedure: BRONCHIAL NEEDLE ASPIRATION BIOPSIES;  Surgeon: [name], DO;  Location: [facility];  Service: Pulmonary;;   COLONOSCOPY  2022   ESOPHAGOGASTRODUODENOSCOPY (EGD) WITH PROPOFOL N/A 01/02/2023   Procedure: ESOPHAGOGASTRODUODENOSCOPY (EGD) WITH PROPOFOL;  Surgeon: Lanelle Bal, DO;  Location[facility];  Service: Endoscopy;  Laterality: N/A;  12:45 pm, pt can't come earlier   FIDUCIAL MARKER PLACEMENT  04/15/2022   Procedure: FIDUCIAL MARKER PLACEMENT;  Surgeon: [name], DO;  Location: [facility];  Service: Pulmonary;;   JOINT REPLACEMENT     left knee   Left shoulder surgery     2007   RADICAL NECK DISSECTION Right 10/10/2022   Procedure: CENTRAL NECK DISSECTION;  Surgeon: Scarlette Ar, MD;  Location: [facility] OR;  Service: ENT;  Laterality: Right;   THYROIDECTOMY Right 10/10/2022   Procedure: RIGHT THYROID LOBECTOMY;  Surgeon: Scarlette Ar, MD;  Location: [facility] OR;  Service: ENT;  Laterality: Right;   TOTAL KNEE ARTHROPLASTY     2012 left   TOTAL KNEE ARTHROPLASTY Right 10/08/2020   Procedure: TOTAL KNEE ARTHROPLASTY;  Surgeon: Ollen Gross, MD;  Location: [facility];  Service: Orthopedics;  Laterality: Right;   TUBAL LIGATION  1978   VIDEO BRONCHOSCOPY WITH  RADIAL ENDOBRONCHIAL ULTRASOUND  04/15/2022   Procedure: VIDEO BRONCHOSCOPY WITH RADIAL ENDOBRONCHIAL ULTRASOUND;  Surgeon: [name], DO;  Location: [facility];  Service: Pulmonary;;    Family History  Problem Relation Age of Onset   Alzheimer's disease Mother    Diabetes Mother    Stroke Father    Hypertension Father    Cancer Brother    Liver disease Neg Hx     Allergies as of 01/05/2024 - Review Complete 01/05/2024  Allergen Reaction Noted   Benadryl [diphenhydramine hcl] Nausea Only 06/29/2013   Penicillins  06/29/2013    Social History   Socioeconomic History   Marital status: Married    Spouse name: Windy Fast   Number of children: 3   Years of education: 16   Highest education level: Bachelor's degree (e.g., BA, AB, BS)  Occupational History   Occupation: retired  Tobacco Use   Smoking status: Former    Current packs/day: 0.00    Average packs/day: 0.3 packs/day for 40.0 years (10.0 ttl pk-yrs)    Types: Cigarettes    Start date: 06/17/1982    Quit date: 06/17/2022    Years since quitting: 1.5   Smokeless tobacco: Never   Tobacco comments:    smokes 3 a day  Vaping Use   Vaping status: Former   Start date: 07/18/2018   Quit date: 07/19/2019  Substance and Sexual Activity   Alcohol use: Yes    Alcohol/week: 4.0 standard drinks of alcohol    Types: 4 Standard drinks or equivalent per week    Comment: occasional, up to 4oz of vodka or whiskey per day for a week, then nothing for months.   Drug use: Never   Sexual activity: Not Currently  Other Topics Concern   Not on file  Social History Narrative   Lives home with her husband. Enjoys travelling, playing bingo, exercising at [facility] center, eats out a lot   Social Drivers of Health   Financial Resource Strain: Low Risk  (08/07/2023)   Overall Financial Resource Strain (CARDIA)    Difficulty of Paying Living Expenses: Not hard at all  Food Insecurity: No Food Insecurity (08/07/2023)   Hunger Vital Sign     Worried About Running Out of Food in the Last Year: Never true    Ran Out of Food in the Last Year: Never true  Transportation Needs: No Transportation Needs (08/07/2023)   PRAPARE - Administrator, Civil Service (Medical): No    Lack of Transportation (Non-Medical): No  Physical Activity: Insufficiently Active (08/07/2023)   Exercise Vital Sign    Days of Exercise per Week: 3 days    Minutes of Exercise per Session: 30 min  Stress: No Stress Concern Present (08/07/2023)   [employer] of Occupational Health - Occupational Stress Questionnaire    Feeling of Stress : Not at all  Social Connections: Socially Integrated (08/07/2023)   Social Connection and Isolation Panel [NHANES]    Frequency of Communication with Friends and Family: More than three times a week    Frequency of Social Gatherings with Friends and Family: More than three times a week    Attends Religious Services: More than 4 times per year    Active Member of [employer] or Organizations: Yes    Attends Banker Meetings: More than 4 times per year  Marital Status: Married   Review of Systems   Gen: Denies fever, chills, anorexia. Denies fatigue, weakness, weight loss.  CV: Denies chest pain, palpitations, syncope, peripheral edema, and claudication. Resp: + Dyspnea on exertion, + cough , + wheezing.  Denies dyspnea at rest,coughing up blood, and pleurisy. GI: See HPI Derm: Denies rash, itching, dry skin Psych: Denies depression, anxiety, memory loss, confusion. No homicidal or suicidal ideation.  Heme: Denies bruising, bleeding, and enlarged lymph nodes.  Physical Exam   BP 110/69   Pulse 82   Temp 98.4 F (36.9 C)   Ht 5\' 1"  (1.549 m)   Wt 180 lb 3.2 oz (81.7 kg)   BMI 34.05 kg/m   General:   Alert and oriented. No distress noted. Pleasant and cooperative.  Head:  Normocephalic and atraumatic. Eyes:  Conjuctiva clear without scleral icterus. Mouth:  Oral mucosa pink and moist. Good  dentition. No lesions. Lungs: Respiratory Nexa Tory wheezing Heart:  S1, S2 present without murmurs appreciated.  Abdomen:  +BS, soft, non-tender and non-distended. No rebound or guarding. No HSM or masses noted. Rectal: deferred Msk:  Symmetrical without gross deformities. Normal posture. Extremities: Mild nonpitting edema. Neurologic:  Alert and  oriented x4 Psych:  Alert and cooperative. Normal mood and affect.  Assessment  Renee Maldonado is a 78 y.o. female with a history of anxiety, breast cancer, arthritis, COPD, CKD, GERD, HLD, HTN, OA, thyroid cancer, lung cancer, recently died of cirrhosis presenting today for follow-up.  Cirrhosis: Likely MASLD, possible EtOH contributing.  Noted on CT imaging in December 2023.  Autoimmune serologies have been negative.  Hepatitis panel negative.  Never has been a heavy alcohol user.  Continues to use this seldomly on occasion.  Last MELD score was 12.  Currently due for labs and ultrasound for hepatoma screening.  Recent EGD without evidence of varices.  Remains well compensated at this point.  No signs of ascites or hepatic encephalopathy.  Does have some mild constipation as noted below.  Constipation: Having variation of stool consistencies, likely is experiencing some incomplete emptying.  Occasionally having some Bristol 1 stools.  Will have multiple bowel movements a day, but usually small in nature.  Given her liver disease, will start lactulose 15 mL once daily.   PLAN   CBC, CMP, INR, AFP RUQ [place] with elastography Lactulose 10 g once daily. 2 g sodium diet Max 2 g Tylenol daily High-protein diet Avoid NSAIDs and alcohol Continue hydrochlorothiazide Follow-up in 6 months   Brooke Bonito, MSN, FNP-BC, AGACNP-BC [facility]

## 2024-01-05 NOTE — Patient Instructions (Addendum)
 Please have blood work completed at [facility].  We will call you with results once they have been received. Please allow 3-5 business days for review. 2 locations for [facility] in [place]:              1. [address], [place]              2. [address] [place]    We will get you scheduled for an ultrasound of your liver in the near future.  Start lactulose 15 mL years once daily.  Continue taking your hydrochlorothiazide.  Cirrhosis Lifestyle Recommendations:  High-protein diet from a primarily plant-based diet. Avoid red meat.  No raw or undercooked meat, seafood, or shellfish. Low-fat/cholesterol/carbohydrate diet. Limit sodium to no more than 2000 mg/day including everything that you eat and drink. Recommend at least 30 minutes of aerobic and resistance exercise 3 days/week. Limit Tylenol to 2000 mg daily.   Follow up in 6 months, sooner if needed.   It was a pleasure to see you today. I want to create trusting relationships with patients. If you receive a survey regarding your visit,  I greatly appreciate you taking time to fill this out on paper or through your MyChart. I value your feedback.  Brooke Bonito, MSN, FNP-BC, AGACNP-BC [facility]

## 2024-01-05 NOTE — Telephone Encounter (Signed)
 [facility]  [place] scheduled for Monday 01/11/24, arrive at 9:00 am to check in. NPO after midnight.

## 2024-01-06 LAB — COMPREHENSIVE METABOLIC PANEL
ALT: 19 [IU]/L (ref 0–32)
AST: 20 [IU]/L (ref 0–40)
Albumin: 4.2 g/dL (ref 3.8–4.8)
Alkaline Phosphatase: 54 [IU]/L (ref 44–121)
BUN/Creatinine Ratio: 25 (ref 12–28)
BUN: 36 mg/dL — ABNORMAL HIGH (ref 8–27)
Bilirubin Total: 0.3 mg/dL (ref 0.0–1.2)
CO2: 25 mmol/L (ref 20–29)
Calcium: 9.3 mg/dL (ref 8.7–10.3)
Chloride: 98 mmol/L (ref 96–106)
Creatinine, Ser: 1.42 mg/dL — ABNORMAL HIGH (ref 0.57–1.00)
Globulin, Total: 1.7 g/dL (ref 1.5–4.5)
Glucose: 124 mg/dL — ABNORMAL HIGH (ref 70–99)
Potassium: 4.1 mmol/L (ref 3.5–5.2)
Sodium: 140 mmol/L (ref 134–144)
Total Protein: 5.9 g/dL — ABNORMAL LOW (ref 6.0–8.5)
eGFR: 38 mL/min/{1.73_m2} — ABNORMAL LOW (ref >59)

## 2024-01-06 LAB — CBC/DIFF AMBIGUOUS DEFAULT
Basophils Absolute: 0 10*3/uL (ref 0.0–0.2)
Basos: 0 %
EOS (ABSOLUTE): 0 10*3/uL (ref 0.0–0.4)
Eos: 0 %
Hematocrit: 39.9 % (ref 34.0–46.6)
Hemoglobin: 12.7 g/dL (ref 11.1–15.9)
Immature Grans (Abs): 0 10*3/uL (ref 0.0–0.1)
Immature Granulocytes: 1 %
Lymphocytes Absolute: 1.3 10*3/uL (ref 0.7–3.1)
Lymphs: 22 %
MCH: 26.6 pg (ref 26.6–33.0)
MCHC: 31.8 g/dL (ref 31.5–35.7)
MCV: 84 fL (ref 79–97)
Monocytes Absolute: 0.5 10*3/uL (ref 0.1–0.9)
Monocytes: 9 %
Neutrophils Absolute: 4.2 10*3/uL (ref 1.4–7.0)
Neutrophils: 68 %
Platelets: 323 10*3/uL (ref 150–450)
RBC: 4.78 x10E6/uL (ref 3.77–5.28)
RDW: 13.7 % (ref 11.7–15.4)
WBC: 6.1 10*3/uL (ref 3.4–10.8)

## 2024-01-06 LAB — AFP TUMOR MARKER: AFP, Serum, Tumor Marker: 2.6 ng/mL (ref 0.0–9.2)

## 2024-01-06 LAB — SPECIMEN STATUS REPORT

## 2024-01-06 LAB — PROTIME-INR
INR: 0.9 (ref 0.9–1.2)
Prothrombin Time: 10.6 s (ref 9.1–12.0)

## 2024-01-06 NOTE — Telephone Encounter (Signed)
 Pt could not do 01/11/24 appointment. Rescheduled [place] to 01/15/24, Friday arrive at 8:15 am, NPO after midnight. Pt informed and verbalized understanding.

## 2024-01-07 ENCOUNTER — Other Ambulatory Visit: Payer: Self-pay | Admitting: Family Medicine

## 2024-01-07 ENCOUNTER — Ambulatory Visit: Payer: Medicare HMO

## 2024-01-07 NOTE — Telephone Encounter (Signed)
 Last OV 10/12/23. Last RF historical provider. Next OV none scheduled at this time

## 2024-01-11 ENCOUNTER — Ambulatory Visit (HOSPITAL_COMMUNITY): Payer: Medicare HMO

## 2024-01-15 ENCOUNTER — Ambulatory Visit (HOSPITAL_COMMUNITY)
Admission: RE | Admit: 2024-01-15 | Discharge: 2024-01-15 | Disposition: A | Discharge status: Home / Self Care | Payer: Medicare HMO | Source: Ambulatory Visit | Attending: Gastroenterology | Admitting: Gastroenterology

## 2024-01-15 DIAGNOSIS — K7460 Unspecified cirrhosis of liver: Secondary | ICD-10-CM | POA: Insufficient documentation

## 2024-01-19 ENCOUNTER — Ambulatory Visit: Payer: Medicare HMO

## 2024-01-19 DIAGNOSIS — E538 Deficiency of other specified B group vitamins: Secondary | ICD-10-CM

## 2024-01-19 MED ORDER — CYANOCOBALAMIN 1000 MCG/ML IJ SOLN
1000.0000 ug | Freq: Once | INTRAMUSCULAR | Status: AC
Start: 2024-01-19 — End: 2024-02-25
  Administered 2024-02-25: 1000 ug via INTRAMUSCULAR

## 2024-01-19 NOTE — Progress Notes (Signed)
 Patient is in office today for a nurse visit for B12 Injection. Patient Injection was given in the  Right deltoid. Patient tolerated injection well.

## 2024-01-30 ENCOUNTER — Other Ambulatory Visit: Payer: Self-pay | Admitting: Gastroenterology

## 2024-02-11 ENCOUNTER — Telehealth: Payer: Self-pay | Admitting: *Deleted

## 2024-02-11 NOTE — Telephone Encounter (Signed)
 Called patient to inform of Ct and telephone fu, patient declined and wants to wait until after Easter to do these things, notified Ashlyn Bruning

## 2024-02-12 ENCOUNTER — Telehealth: Payer: Self-pay | Admitting: *Deleted

## 2024-02-12 NOTE — Telephone Encounter (Signed)
 Called patient to inform of CT for 03-15-24  - arrival time- 5:45 pm @ [facility], no restrictions to scan, patient to be seen in person by Marcello Fennel on 03-24-24 @ 11 am for results, spoke with patient and she is aware of these appts. and the instructions

## 2024-02-15 ENCOUNTER — Encounter: Payer: Self-pay | Admitting: Family Medicine

## 2024-02-15 ENCOUNTER — Other Ambulatory Visit: Payer: Self-pay | Admitting: Family Medicine

## 2024-02-15 ENCOUNTER — Telehealth: Payer: Self-pay

## 2024-02-15 DIAGNOSIS — J439 Emphysema, unspecified: Secondary | ICD-10-CM

## 2024-02-15 NOTE — Telephone Encounter (Unsigned)
 Copied from CRM [id number]. Topic: Clinical - Medication Refill >> Feb 15, 2024  4:19 PM Higinio Roger wrote: Most Recent Primary Care Visit:   Medication: albuterol (VENTOLIN HFA) 108 (90 Base) MCG/ACT inhaler   Has the patient contacted their pharmacy? Yes (Agent: If no, request that the patient contact the pharmacy for the refill. If patient does not wish to contact the pharmacy document the reason why and proceed with request.) (Agent: If yes, when and what did the pharmacy advise?)  Is this the correct pharmacy for this prescription? Yes If no, delete pharmacy and type the correct one.  This is the patient's preferred pharmacy:   [facility] [place], [place] - [address] [place] [address] Phone: [phone] Fax: [phone]  Has the prescription been filled recently? Yes  Is the patient out of the medication? No  Has the patient been seen for an appointment in the last year OR does the patient have an upcoming appointment? Yes  Can we respond through MyChart? No. Call [phone]   Agent: Please be advised that Rx refills may take up to 3 business days. We ask that you follow-up with your pharmacy.

## 2024-02-15 NOTE — Telephone Encounter (Signed)
 Stacks NTBS in May for 6 mos FU RF sent to pharmacy

## 2024-02-15 NOTE — Telephone Encounter (Signed)
 Sent letter

## 2024-02-15 NOTE — Telephone Encounter (Signed)
 Spoke with patient and informed that she got her Tdap 08/21/2023. Patient verbalized understanding. [facility] 02/15/24

## 2024-02-16 NOTE — Telephone Encounter (Signed)
 Last Fill: 01/27/255  Last OV: 12/31/23  ACUTE Next OV: 08/08/24 AWV  Routing to provider for review/authorization.

## 2024-02-17 ENCOUNTER — Ambulatory Visit (HOSPITAL_COMMUNITY)

## 2024-02-17 MED ORDER — ALBUTEROL SULFATE HFA 108 (90 BASE) MCG/ACT IN AERS
INHALATION_SPRAY | RESPIRATORY_TRACT | 5 refills | Status: AC
Start: 2024-02-17 — End: ?

## 2024-02-24 ENCOUNTER — Ambulatory Visit: Payer: Medicare HMO | Admitting: Urology

## 2024-02-25 ENCOUNTER — Ambulatory Visit (INDEPENDENT_AMBULATORY_CARE_PROVIDER_SITE_OTHER)

## 2024-02-25 ENCOUNTER — Telehealth: Payer: Self-pay

## 2024-02-25 DIAGNOSIS — E538 Deficiency of other specified B group vitamins: Secondary | ICD-10-CM | POA: Diagnosis not present

## 2024-02-25 NOTE — Progress Notes (Signed)
 Patient is in office today for a nurse visit for B12 Injection. Patient Injection was given in the  Left deltoid. Patient tolerated injection well.

## 2024-02-25 NOTE — Telephone Encounter (Signed)
 Per patient request, I reached out to schedule office visit, appointment scheduled. [facility] 02/25/24

## 2024-03-01 DIAGNOSIS — Z1231 Encounter for screening mammogram for malignant neoplasm of breast: Secondary | ICD-10-CM | POA: Diagnosis not present

## 2024-03-01 DIAGNOSIS — C73 Malignant neoplasm of thyroid gland: Secondary | ICD-10-CM | POA: Diagnosis not present

## 2024-03-01 DIAGNOSIS — Z5181 Encounter for therapeutic drug level monitoring: Secondary | ICD-10-CM | POA: Diagnosis not present

## 2024-03-01 DIAGNOSIS — F17210 Nicotine dependence, cigarettes, uncomplicated: Secondary | ICD-10-CM | POA: Diagnosis not present

## 2024-03-01 DIAGNOSIS — C50311 Malignant neoplasm of lower-inner quadrant of right female breast: Secondary | ICD-10-CM | POA: Diagnosis not present

## 2024-03-01 DIAGNOSIS — M810 Age-related osteoporosis without current pathological fracture: Secondary | ICD-10-CM | POA: Diagnosis not present

## 2024-03-01 DIAGNOSIS — Z170 Estrogen receptor positive status [ER+]: Secondary | ICD-10-CM | POA: Diagnosis not present

## 2024-03-01 DIAGNOSIS — C3491 Malignant neoplasm of unspecified part of right bronchus or lung: Secondary | ICD-10-CM | POA: Diagnosis not present

## 2024-03-01 DIAGNOSIS — Z79811 Long term (current) use of aromatase inhibitors: Secondary | ICD-10-CM | POA: Diagnosis not present

## 2024-03-01 DIAGNOSIS — F17200 Nicotine dependence, unspecified, uncomplicated: Secondary | ICD-10-CM | POA: Diagnosis not present

## 2024-03-07 ENCOUNTER — Other Ambulatory Visit: Payer: Self-pay | Admitting: Family Medicine

## 2024-03-07 DIAGNOSIS — E785 Hyperlipidemia, unspecified: Secondary | ICD-10-CM

## 2024-03-07 DIAGNOSIS — I10 Essential (primary) hypertension: Secondary | ICD-10-CM

## 2024-03-09 ENCOUNTER — Ambulatory Visit (HOSPITAL_COMMUNITY)

## 2024-03-10 ENCOUNTER — Telehealth: Payer: Self-pay | Admitting: *Deleted

## 2024-03-10 NOTE — Telephone Encounter (Signed)
 Please advise  Copied from CRM [id number]. Topic: Clinical - Prescription Issue >> Mar 10, 2024 11:14 AM Felizardo Hotter wrote: Reason for CRM: Pt would like a call back regarding taking both alendronate  (FOSAMAX ) 70 MG tablet and raloxifene  (EVISTA ) 60 MG tablet. Pt wants to know if this is safe? Please call pt at [phone].

## 2024-03-10 NOTE — Telephone Encounter (Signed)
 DC the raloxifene  (evista )

## 2024-03-14 ENCOUNTER — Other Ambulatory Visit: Payer: Self-pay | Admitting: Family Medicine

## 2024-03-14 DIAGNOSIS — H3500 Unspecified background retinopathy: Secondary | ICD-10-CM

## 2024-03-14 NOTE — Telephone Encounter (Signed)
Referral placed, as requested WS 

## 2024-03-15 ENCOUNTER — Ambulatory Visit (HOSPITAL_COMMUNITY)
Admission: RE | Admit: 2024-03-15 | Discharge: 2024-03-15 | Disposition: A | Discharge status: Home / Self Care | Source: Ambulatory Visit | Attending: Urology | Admitting: Urology

## 2024-03-15 DIAGNOSIS — C3491 Malignant neoplasm of unspecified part of right bronchus or lung: Secondary | ICD-10-CM | POA: Insufficient documentation

## 2024-03-15 DIAGNOSIS — I700 Atherosclerosis of aorta: Secondary | ICD-10-CM | POA: Diagnosis not present

## 2024-03-15 DIAGNOSIS — C3490 Malignant neoplasm of unspecified part of unspecified bronchus or lung: Secondary | ICD-10-CM | POA: Diagnosis not present

## 2024-03-15 DIAGNOSIS — J8410 Pulmonary fibrosis, unspecified: Secondary | ICD-10-CM | POA: Diagnosis not present

## 2024-03-15 NOTE — Telephone Encounter (Signed)
Pt notified.    LS

## 2024-03-17 ENCOUNTER — Ambulatory Visit: Payer: Self-pay | Admitting: Urology

## 2024-03-22 NOTE — Progress Notes (Signed)
 Telephone nursing appointment for review of most recent CT-Chest results. I verified patient's identity x2 and began nursing interview.    Patient states issues as follows... -Pain: *** -Fatigue: *** -Chest: *** -Lungs: *** -Cardiac: *** -Skin: *** -Appetite: ***   Patient denies any other related issues at this time.   Meaningful use complete.   Patient aware of their telephone appointment w/ Ashlyn Bruning PA-C. I left my extension [phone] in case patient needs anything. Patient verbalized understanding. This concludes the nursing interview.   Patient preferred phone # [phone]    Avery Bodo, LPN

## 2024-03-24 ENCOUNTER — Ambulatory Visit
Admission: RE | Admit: 2024-03-24 | Discharge: 2024-03-24 | Disposition: A | Discharge status: Home / Self Care | Source: Ambulatory Visit | Attending: Urology | Admitting: Urology

## 2024-03-24 ENCOUNTER — Encounter: Payer: Self-pay | Admitting: Urology

## 2024-03-24 VITALS — BP 145/76 | HR 72 | Temp 97.3°F | Resp 18 | Ht 61.0 in | Wt 181.1 lb

## 2024-03-24 DIAGNOSIS — Z79624 Long term (current) use of inhibitors of nucleotide synthesis: Secondary | ICD-10-CM | POA: Insufficient documentation

## 2024-03-24 DIAGNOSIS — Z79899 Other long term (current) drug therapy: Secondary | ICD-10-CM | POA: Insufficient documentation

## 2024-03-24 DIAGNOSIS — R918 Other nonspecific abnormal finding of lung field: Secondary | ICD-10-CM

## 2024-03-24 DIAGNOSIS — C73 Malignant neoplasm of thyroid gland: Secondary | ICD-10-CM | POA: Diagnosis not present

## 2024-03-24 DIAGNOSIS — I2510 Atherosclerotic heart disease of native coronary artery without angina pectoris: Secondary | ICD-10-CM | POA: Insufficient documentation

## 2024-03-24 DIAGNOSIS — C3431 Malignant neoplasm of lower lobe, right bronchus or lung: Secondary | ICD-10-CM | POA: Diagnosis not present

## 2024-03-24 DIAGNOSIS — C3491 Malignant neoplasm of unspecified part of right bronchus or lung: Secondary | ICD-10-CM

## 2024-03-24 DIAGNOSIS — Z7951 Long term (current) use of inhaled steroids: Secondary | ICD-10-CM | POA: Insufficient documentation

## 2024-03-24 DIAGNOSIS — Z79811 Long term (current) use of aromatase inhibitors: Secondary | ICD-10-CM | POA: Insufficient documentation

## 2024-03-24 DIAGNOSIS — Z79890 Hormone replacement therapy (postmenopausal): Secondary | ICD-10-CM | POA: Diagnosis not present

## 2024-03-24 DIAGNOSIS — Z87891 Personal history of nicotine dependence: Secondary | ICD-10-CM | POA: Diagnosis not present

## 2024-03-24 NOTE — Progress Notes (Signed)
 Radiation Oncology         [phone] ________________________________  Name: Kenyatte Piercefield MRN: [id number]  Date: 03/24/2024  DOB: June 19, 1946  Post Treatment Note  CC: Roise Cleaver, MD  Roise Cleaver, MD  Diagnosis:   78 y.o. female with Stage IA, NSCLC, adenocarcinoma of the right lower lobe lung.  Interval Since Last Radiation:  1 year, 10 months  06/02/22 - 06/09/22:   The target in the RLL lung was treated to 54 Gy in 3 fractions of 18 Gy  Narrative:  She tolerated radiation treatment relatively well with only mild fatigue.  Her post-treatment CT Chest scan from 04/24/23 showed increased consolidation and ground-glass in the superior portion of the right lower lobe and adjacent right upper lobe, likely evolving postradiation change, and an unchanged appearance of the other lung nodules. No definite disease progression or recurrence and follow up scans since that time have remained stable. She had a recent follow up CT Chest on 03/15/24 that shows stable radiation changes and an unchanged appearance of the additional bilateral lung nodules. We reviewed these results today.     She was also diagnosed with papillary thyroid  carcinoma around the same time as her lung cancer diagnosis and was seen by Dr. Westley Hammers on 04/01/22 to discuss treatment. The recommendation was for surgical resection, likely hemithyroidectomy, to be scheduled after completing her lung treatments. She was quite anxious about the delay in treating the thyroid  cancer and was seen in consult with Dr. Ralston Burkes on 08/11/22. She subsequently had a right thyroid  lobectomy with right central neck dissection under the care of Dr. Ralston Burkes on 10/10/22 and surgical pathology confirmed [id number] papillary thyroid  carcinoma.  She was being followed by Dr. Geanie Keen in endocrinology at [facility] for surveillance labs for thyroid  but this is now being followed by her PCP, Dr. Veleta Gerold, and also continues in routine follow up with Dr. Betsey Brow (medical  oncologist at [facility]) for her history of breast cancer.                   On review of systems, the patient states that she is doing well in general and remains without complaints. She specifically denies dysphagia, increased shortness of breath, chest pain or hemoptysis.She had a URI in October 2024 and unfortunately, fractured her right 7th rib with the coughing- seen on CT imaging in 08/2023, but the pain has resolved completely. She has been dealing with some pain in the left buttock for several weeks now. The pain does not radiate down the leg but she does have some chronic low back pain as well. She recently met with a chiropractor for evaluation and has a scheduled follow up to discuss treatment recommendations. She reports a healthy appetite and is maintaining her weight. No recent fevers, chills or night sweats.    ALLERGIES:  is allergic to benadryl  [diphenhydramine  hcl] and penicillins.  Meds: Current Outpatient Medications  Medication Sig Dispense Refill   nystatin powder Apply 1 Application topically 3 (three) times daily.     albuterol  (VENTOLIN  HFA) 108 (90 Base) MCG/ACT inhaler 2 puffs in lungs every 6 hours as needed for wheezing/short of breath. 8.5 g 5   alendronate  (FOSAMAX ) 70 MG tablet Take 1 tablet (70 mg total) by mouth every 7 (seven) days. Take with a full glass of water  on an empty stomach. Do not lay down for at least 2 hours 13 tablet 3   amLODipine  (NORVASC ) 5 MG tablet TAKE ONE TABLET ONCE DAILY 90 tablet 3  anastrozole  (ARIMIDEX ) 1 MG tablet Take 1 mg by mouth in the morning.     Calcium  Carb-Cholecalciferol 600-20 MG-MCG TABS Take 1 tablet by mouth in the morning.     chlorpheniramine  (CHLOR-TRIMETON ) 4 MG tablet Take 4 mg by mouth in the morning and at bedtime.     cholecalciferol (VITAMIN D3) 25 MCG (1000 UNIT) tablet Take 1,000 Units by mouth in the morning.     CONSTULOSE  10 GM/15ML solution TAKE BY MOUTH EVERY DAY 236 mL 0   cyanocobalamin  (VITAMIN B12)  1000 MCG/ML injection Inject 1 mL (1,000 mcg total) into the skin every 30 (thirty) days. 1 mL 11   doxycycline  (VIBRAMYCIN ) 100 MG capsule Take 1 capsule (100 mg total) by mouth 2 (two) times daily. 14 capsule 0   famotidine  (PEPCID ) 40 MG tablet TAKE ONE TABLET ONCE DAILY 90 tablet 0   fluticasone  furoate-vilanterol (BREO ELLIPTA ) 200-25 MCG/ACT AEPB Inhale 1 puff into the lungs daily. (Patient taking differently: Inhale 1 puff into the lungs daily as needed (shortness of breath).) 1 each 11   guaifenesin  (HUMIBID E) 400 MG TABS tablet Take 1 tablet (400 mg total) by mouth every 6 (six) hours as needed. 30 tablet 0   hydrochlorothiazide  (HYDRODIURIL ) 25 MG tablet TAKE ONE TABLET ONCE DAILY 90 tablet 1   hydrOXYzine  (ATARAX /VISTARIL ) 25 MG tablet Take 25 mg by mouth every 4 (four) hours as needed for itching.     L-Lysine  500 MG TABS Take 500 mg by mouth at bedtime.     levothyroxine  (SYNTHROID ) 75 MCG tablet Take 1 tablet (75 mcg total) by mouth daily before breakfast. 90 tablet 1   magnesium  oxide (MAG-OX) 400 (240 Mg) MG tablet Take 1 tablet (400 mg total) by mouth daily. 90 tablet 3   Melatonin 10 MG CAPS Take 10 mg by mouth at bedtime.     Polyethyl Glycol-Propyl Glycol (LUBRICANT EYE DROPS) 0.4-0.3 % SOLN Place 1-2 drops into both eyes 3 (three) times daily as needed (dry/irritated eyes.).     potassium chloride  (KLOR-CON  10) 10 MEQ tablet Take 1 tablet (10 mEq total) by mouth daily. 180 tablet 3   predniSONE  (STERAPRED UNI-PAK 21 TAB) 10 MG (21) TBPK tablet Use as directed on back of pill pack 21 tablet 0   raloxifene  (EVISTA ) 60 MG tablet Take 1 tablet (60 mg total) by mouth daily. 90 tablet 2   RESTASIS  0.05 % ophthalmic emulsion Place 1 drop into both eyes 2 (two) times daily as needed (dry eyes).      rosuvastatin  (CRESTOR ) 20 MG tablet TAKE ONE TABLET DAILY 90 tablet 3   sodium chloride  (OCEAN) 0.65 % SOLN nasal spray Place 1 spray into both nostrils as needed for congestion.      traZODone  (DESYREL ) 100 MG tablet Take 1 tablet (100 mg total) by mouth at bedtime. 90 tablet 3   valACYclovir  (VALTREX ) 1000 MG tablet TAKE TWO TABLETS TWICE DAILY FOR ONE DAY FOR COLD SORES 20 tablet 5   valACYclovir  (VALTREX ) 500 MG tablet Take 500 mg by mouth daily as needed (outbreaks). (Patient not taking: Reported on 03/24/2024)     No current facility-administered medications for this encounter.    Physical Findings:  height is 5\' 1"  (1.549 m) and weight is 181 lb 2 oz (82.2 kg). Her temporal temperature is 97.3 F (36.3 C) (abnormal). Her blood pressure is 145/76 (abnormal) and her pulse is 72. Her respiration is 18 and oxygen saturation is 97%.  Pain Assessment Pain Score: 0-No  pain (No chest pains)/10  In general this is a well appearing Caucasian woman in no acute distress. She's alert and oriented x4 and appropriate throughout the examination. Cardiopulmonary assessment is negative for acute distress and she exhibits normal effort.   Lab Findings: Lab Results  Component Value Date   WBC 6.1 01/05/2024   HGB 12.7 01/05/2024   HCT 39.9 01/05/2024   MCV 84 01/05/2024   PLT 323 01/05/2024     Radiographic Findings: CT Chest Wo Contrast Result Date: 03/15/2024 CLINICAL DATA:  Non-small cell lung cancer (NSCLC), monitor disease restaging for NSCLC s/p SBRT 05/2022. * Tracking Code: BO * EXAM: CT CHEST WITHOUT CONTRAST TECHNIQUE: Multidetector CT imaging of the chest was performed following the standard protocol without IV contrast. RADIATION DOSE REDUCTION: This exam was performed according to the departmental dose-optimization program which includes automated exposure control, adjustment of the mA and/or kV according to patient size and/or use of iterative reconstruction technique. COMPARISON:  Chest CT scan from 08/19/2023. FINDINGS: Cardiovascular: Normal cardiac size. No pericardial effusion. No aortic aneurysm. There are coronary artery calcifications, in keeping with coronary  artery disease. There are also mild peripheral atherosclerotic vascular calcifications of thoracic aorta and its major branches. Mediastinum/Nodes: Surgically absent right hemi thyroid . No solid / cystic mediastinal masses. The esophagus is nondistended precluding optimal assessment. No mediastinal or axillary lymphadenopathy by size criteria. Evaluation of bilateral hila is limited due to lack on intravenous contrast: however, no large hilar lymphadenopathy identified. Lungs/Pleura: The central tracheo-bronchial tree is patent. Redemonstration of irregular area of scarring/fibrosis in the posterior segment of right upper lobe with extension across the major fissure into the superior segment of right lower lobe. This is essentially unchanged since the prior study. There is also focal area of scarring in the right lung apex (series 3, image 29), with adjacent probable fiducial markers. No significant interval change. There is also linear area of atelectasis/scarring in the middle lobe, unchanged as well. There multiple sub 4 mm calcified and noncalcified nodules throughout bilateral lungs (marked with electronic arrow sign on series 100). No new suspicious lung nodule seen. No lung mass, consolidation, pleural effusion or pneumothorax. Upper Abdomen: Visualized upper abdominal viscera within normal limits. Musculoskeletal: The visualized soft tissues of the chest wall are grossly unremarkable. No suspicious osseous lesions. There are mild to moderate multilevel degenerative changes in the visualized spine. Mild compression deformity of T6 and T8 vertebral body is similar to the prior study. No significant retropulsion or spinal canal compromise. Redemonstration of focal resection of posterolateral right seventh rib. IMPRESSION: 1. Redemonstration of irregular area of scarring/fibrosis in the posterior segment of right upper lobe with extension across the major fissure into the superior segment of right lower lobe. No  significant interval change. No new suspicious lung nodule. 2. Multiple other nonacute observations, as described above. Aortic Atherosclerosis (ICD10-I70.0). Electronically Signed   By: Beula Brunswick M.D.   On: 03/15/2024 18:17    Impression/Plan: 1. 78 y.o. female with Stage IA, NSCLC, adenocarcinoma of the right lower lobe lung. She has recovered well from the effects of her SBRT lung treatment and remains without complaints. Her recent post-treatment CT Chest scan from 03/15/24 shows stable radiation changes and an unchanged appearance of the additional bilateral lung nodules. No evidence of disease progression or recurrence. Therefore, we will continue with serial CT Chest scans every 6 months at [facility] to continue to closely monitor for any evidence of disease recurrence or progression. She will follow up  either in person or by telephone, her preference, to review the results of each scan but she knows that she is welcome to call at any time in the interim with questions or concerns. She will continue her routine follow up with Dr. Veleta Gerold for labs s/p thyroidectomy and with Dr. Betsey Brow for her history of breast cancer.   I personally spent 30 minutes in this encounter including chart review, reviewing radiological studies, face-to-face conversation with the patient, entering orders and completing documentation.     Arta Bihari, PA-C

## 2024-03-31 ENCOUNTER — Ambulatory Visit (INDEPENDENT_AMBULATORY_CARE_PROVIDER_SITE_OTHER): Admitting: Family Medicine

## 2024-03-31 ENCOUNTER — Encounter: Payer: Self-pay | Admitting: Family Medicine

## 2024-03-31 VITALS — BP 147/69 | HR 77 | Temp 98.0°F | Ht 61.0 in

## 2024-03-31 DIAGNOSIS — F5101 Primary insomnia: Secondary | ICD-10-CM | POA: Diagnosis not present

## 2024-03-31 DIAGNOSIS — E782 Mixed hyperlipidemia: Secondary | ICD-10-CM

## 2024-03-31 DIAGNOSIS — K219 Gastro-esophageal reflux disease without esophagitis: Secondary | ICD-10-CM | POA: Diagnosis not present

## 2024-03-31 DIAGNOSIS — H35379 Puckering of macula, unspecified eye: Secondary | ICD-10-CM | POA: Diagnosis not present

## 2024-03-31 DIAGNOSIS — E890 Postprocedural hypothyroidism: Secondary | ICD-10-CM | POA: Diagnosis not present

## 2024-03-31 DIAGNOSIS — M7918 Myalgia, other site: Secondary | ICD-10-CM

## 2024-03-31 DIAGNOSIS — I10 Essential (primary) hypertension: Secondary | ICD-10-CM

## 2024-03-31 DIAGNOSIS — E538 Deficiency of other specified B group vitamins: Secondary | ICD-10-CM | POA: Diagnosis not present

## 2024-03-31 MED ORDER — LEVOTHYROXINE SODIUM 75 MCG PO TABS: 75.0000 ug | ORAL_TABLET | Freq: Every day | ORAL | 1 refills | Status: DC

## 2024-03-31 MED ORDER — FAMOTIDINE 40 MG PO TABS: 40.0000 mg | ORAL_TABLET | Freq: Every day | ORAL | 0 refills | Status: DC

## 2024-03-31 MED ORDER — CYANOCOBALAMIN 1000 MCG/ML IJ SOLN
1000.0000 ug | Freq: Once | INTRAMUSCULAR | Status: AC
Start: 2024-03-31 — End: 2024-03-31
  Administered 2024-03-31: 1000 ug via INTRAMUSCULAR

## 2024-03-31 MED ORDER — HYDROCHLOROTHIAZIDE 25 MG PO TABS: 25.0000 mg | ORAL_TABLET | Freq: Every day | ORAL | 1 refills | Status: AC

## 2024-03-31 MED ORDER — TRAZODONE HCL 100 MG PO TABS: 100.0000 mg | ORAL_TABLET | Freq: Every day | ORAL | 3 refills | Status: AC

## 2024-03-31 NOTE — Progress Notes (Signed)
 Subjective:  Patient ID: Renee Maldonado, female    DOB: [birth year]  Age: 78 y.o. MRN: [id number]  CC: recommendation (Should she go to chiropractor/Should she go to PT), Hip Pain (Hip and buttocks pain that is constant. Wonders if you can prescribe tramadol .), Eye Problem (Needs referral for retina a specialist of ophthalmologist. Last time she was seen they said she has a wriggled retina. Problems with vision recently. [place] [employer] driving.//*referral already placed but never heard from anyone/Wants to go to Dr.Rankin), and B12 Injection (Wants lab work done to see if injection is still needed. )   HPI Renee Maldonado presents f stating that she had eval by chiropractor. Considering a chiropractor. Worried about osteopenia and possibility of fracture. Constant left buttocks pain 9/10 at night.  Wonders if she should have physical therapy instead.  Considering follow up for lung cancer with hematology.  Her provider wants her to come in every 6 months.  She wants me to let her know whether that is necessary or not.  She needs a referral to a retina specialist.  She is had an epiretinal membrane in the past and is having some difficulty with her vision currently.  This is on the right.  Patient in for follow-up of GERD. Currently asymptomatic taking  PPI daily. There is no chest pain or heartburn. No hematemesis and no melena. No dysphagia or choking. Onset is remote. Progression is stable. Complicating factors, none.   Patient presents for follow-up on  thyroid . The patient has a history of hypothyroidism for many years. It has been stable recently. Pt. denies any change in  voice, loss of hair, heat or cold intolerance. Energy level has been adequate to good. Patient denies constipation and diarrhea. No myxedema. Medication is as noted below. Verified that pt is taking it daily on an empty stomach. Well tolerated.     03/31/2024   10:15 AM 12/31/2023   12:39 PM 10/12/2023   10:27 AM  Depression screen  PHQ 2/9  Decreased Interest 0 0 0  Down, Depressed, Hopeless 0 0 0  PHQ - 2 Score 0 0 0  Altered sleeping 3 0   Tired, decreased energy 1 0   Change in appetite 0 0   Feeling bad or failure about yourself  0 0   Trouble concentrating 0 0   Moving slowly or fidgety/restless 2 0   Suicidal thoughts 0 0   PHQ-9 Score 6 0   Difficult doing work/chores Somewhat difficult Not difficult at all     History Renee Maldonado has a past medical history of Anxiety, Arthritis, Cancer (HCC), COPD (chronic obstructive pulmonary disease) (HCC), GERD (gastroesophageal reflux disease), Humerus fracture (2007), Hyperlipidemia, Hypertension, Insomnia, Lung cancer (HCC), OA (osteoarthritis) of knee (10/08/2020), and Thyroid  cancer (HCC) (2023).   She has a past surgical history that includes Total knee arthroplasty; Left shoulder surgery; Joint replacement; Tubal ligation (1978); Breast surgery (Right, 01/2019); Breast lumpectomy (Right, 02/2019); Total knee arthroplasty (Right, 10/08/2020); Bronchial biopsy (04/15/2022); Bronchial needle aspiration biopsy (04/15/2022); Bronchial brushings (04/15/2022); Video bronchoscopy with radial endobronchial ultrasound (04/15/2022); Fiducial marker placement (04/15/2022); Colonoscopy (2022); Thyroidectomy (Right, 10/10/2022); Radical neck dissection (Right, 10/10/2022); and Esophagogastroduodenoscopy (egd) with propofol  (N/A, 01/02/2023).   Her family history includes Alzheimer's disease in her mother; Cancer in her brother; Diabetes in her mother; Hypertension in her father; Stroke in her father.She reports that she quit smoking about 21 months ago. Her smoking use included cigarettes. She started smoking about 41 years ago. She has a 10 pack-year  smoking history. She has never used smokeless tobacco. She reports current alcohol  use of about 4.0 standard drinks of alcohol  per week. She reports that she does not use drugs.    ROS Review of Systems  Constitutional: Negative.   HENT:  Negative.    Eyes:  Negative for visual disturbance.  Respiratory:  Negative for shortness of breath.   Cardiovascular:  Negative for chest pain.  Gastrointestinal:  Negative for abdominal pain.  Musculoskeletal:  Negative for arthralgias.    Objective:  BP (!) 147/69   Pulse 77   Temp 98 F (36.7 C)   Ht 5\' 1"  (1.549 m)   SpO2 98%   BMI 34.22 kg/m   BP Readings from Last 3 Encounters:  03/31/24 (!) 147/69  03/24/24 (!) 145/76  01/05/24 110/69    Wt Readings from Last 3 Encounters:  03/24/24 181 lb 2 oz (82.2 kg)  01/05/24 180 lb 3.2 oz (81.7 kg)  12/31/23 180 lb (81.6 kg)     Physical Exam Constitutional:      General: She is not in acute distress.    Appearance: She is well-developed.  Cardiovascular:     Rate and Rhythm: Normal rate and regular rhythm.  Pulmonary:     Breath sounds: Normal breath sounds.  Musculoskeletal:        General: Normal range of motion.  Skin:    General: Skin is warm and dry.  Neurological:     Mental Status: She is alert and oriented to person, place, and time.      Assessment & Plan:  B12 deficiency -     Vitamin B12 -     Cyanocobalamin   Primary hypertension -     hydroCHLOROthiazide ; Take 1 tablet (25 mg total) by mouth daily.  Dispense: 90 tablet; Refill: 1  Mixed hyperlipidemia  Left buttock pain -     Ambulatory referral to Physical Therapy  Postoperative hypothyroidism -     Levothyroxine  Sodium; Take 1 tablet (75 mcg total) by mouth daily before breakfast.  Dispense: 90 tablet; Refill: 1  Epiretinal membrane, unspecified laterality -     Ambulatory referral to Ophthalmology  Primary insomnia -     traZODone  HCl; Take 1 tablet (100 mg total) by mouth at bedtime.  Dispense: 90 tablet; Refill: 3  Gastroesophageal reflux disease without esophagitis -     Famotidine ; Take 1 tablet (40 mg total) by mouth daily.  Dispense: 90 tablet; Refill: 0     Follow-up: No follow-ups on file.  Renee Maldonado, M.D.

## 2024-04-01 LAB — VITAMIN B12: Vitamin B-12: 716 pg/mL (ref 232–1245)

## 2024-04-03 ENCOUNTER — Ambulatory Visit: Payer: Self-pay | Admitting: Family Medicine

## 2024-04-03 NOTE — Progress Notes (Signed)
Hello Paraskevi,  Your lab result is normal and/or stable.Some minor variations that are not significant are commonly marked abnormal, but do not represent any medical problem for you.  Best regards, Hrishikesh Hoeg, M.D.

## 2024-04-18 ENCOUNTER — Telehealth: Payer: Self-pay | Admitting: Family Medicine

## 2024-04-18 NOTE — Telephone Encounter (Signed)
 Copied from CRM [id number]. Topic: Referral - Status >> Apr 18, 2024  9:07 AM Rennis Case wrote: Reason for CRM: Pt calling to check on status of both referrals for opthalmology and Physical Therapy. Pt states she spoke to Dr. Veleta Gerold and requested referral to be sent to Dr. Seward Dao for ophthalmology, pt reached out to Dr. Evalene Hilda office and was told they had not received referral. Pt requesting referral be sent to their office.   Pt states she also has not heard from anyone about the physical therapy and requesting referral be sent.   Pt requesting call once referrals are sent to offices, [phone] - okay to leave detailed message.

## 2024-04-19 NOTE — Telephone Encounter (Signed)
 Tried to call # back given by Patient - states # is not in service at this time.  Referral sent to Dr. Evalene Hilda Office and [facility] PT Office as Patient requested.

## 2024-04-19 NOTE — Telephone Encounter (Signed)
Called patient, no answer, left detailed voice message 

## 2024-04-27 ENCOUNTER — Ambulatory Visit: Attending: Family Medicine | Admitting: Physical Therapy

## 2024-04-27 ENCOUNTER — Encounter: Payer: Self-pay | Admitting: Physical Therapy

## 2024-04-27 ENCOUNTER — Telehealth: Payer: Self-pay | Admitting: Family Medicine

## 2024-04-27 ENCOUNTER — Other Ambulatory Visit: Payer: Self-pay

## 2024-04-27 DIAGNOSIS — M62838 Other muscle spasm: Secondary | ICD-10-CM | POA: Insufficient documentation

## 2024-04-27 DIAGNOSIS — M5459 Other low back pain: Secondary | ICD-10-CM | POA: Diagnosis not present

## 2024-04-27 DIAGNOSIS — M7918 Myalgia, other site: Secondary | ICD-10-CM | POA: Insufficient documentation

## 2024-04-27 NOTE — Therapy (Signed)
 OUTPATIENT PHYSICAL THERAPY THORACOLUMBAR EVALUATION   Patient Name: Renee Maldonado MRN: [id number] DOB:Sep 20, 1946, 78 y.o., female Today's Date: 04/27/2024  END OF SESSION:  PT End of Session - 04/27/24 1244     Visit Number 1    Number of Visits 8    Date for PT Re-Evaluation 05/25/24    PT Start Time 1100    PT Stop Time 1149    PT Time Calculation (min) 49 min    Activity Tolerance Patient tolerated treatment well    Behavior During Therapy WFL for tasks assessed/performed             Past Medical History:  Diagnosis Date   Anxiety    Arthritis    Cancer (HCC)    breast Right no chemo or radiation  on Arimidex    COPD (chronic obstructive pulmonary disease) (HCC)    on Breo   GERD (gastroesophageal reflux disease)    Humerus fracture 2007   Hyperlipidemia    Hypertension    Insomnia    Lung cancer (HCC)    OA (osteoarthritis) of knee 10/08/2020   Thyroid  cancer (HCC) 2023   Past Surgical History:  Procedure Laterality Date   BREAST LUMPECTOMY Right 02/2019   BREAST SURGERY Right 01/2019   breast biopsy   BRONCHIAL BIOPSY  04/15/2022   Procedure: BRONCHIAL BIOPSIES;  Surgeon: Prudy Brownie, DO;  Location: [facility];  Service: Pulmonary;;   BRONCHIAL BRUSHINGS  04/15/2022   Procedure: BRONCHIAL BRUSHINGS;  Surgeon: Prudy Brownie, DO;  Location: [facility];  Service: Pulmonary;;   BRONCHIAL NEEDLE ASPIRATION BIOPSY  04/15/2022   Procedure: BRONCHIAL NEEDLE ASPIRATION BIOPSIES;  Surgeon: Prudy Brownie, DO;  Location: [facility];  Service: Pulmonary;;   COLONOSCOPY  2022   ESOPHAGOGASTRODUODENOSCOPY (EGD) WITH PROPOFOL  N/A 01/02/2023   Procedure: ESOPHAGOGASTRODUODENOSCOPY (EGD) WITH PROPOFOL ;  Surgeon: Vinetta Greening, DO;  Location: [facility];  Service: Endoscopy;  Laterality: N/A;  12:45 pm, pt can't come earlier   FIDUCIAL MARKER PLACEMENT  04/15/2022   Procedure: FIDUCIAL MARKER PLACEMENT;  Surgeon: Prudy Brownie, DO;  Location: [facility];  Service: Pulmonary;;   JOINT REPLACEMENT     left knee   Left shoulder surgery     2007   RADICAL NECK DISSECTION Right 10/10/2022   Procedure: CENTRAL NECK DISSECTION;  Surgeon: Rush Coupe, MD;  Location: [facility] OR;  Service: ENT;  Laterality: Right;   THYROIDECTOMY Right 10/10/2022   Procedure: RIGHT THYROID  LOBECTOMY;  Surgeon: Rush Coupe, MD;  Location: [facility] OR;  Service: ENT;  Laterality: Right;   TOTAL KNEE ARTHROPLASTY     2012 left   TOTAL KNEE ARTHROPLASTY Right 10/08/2020   Procedure: TOTAL KNEE ARTHROPLASTY;  Surgeon: Liliane Rei, MD;  Location: [facility];  Service: Orthopedics;  Laterality: Right;   TUBAL LIGATION  1978   VIDEO BRONCHOSCOPY WITH RADIAL ENDOBRONCHIAL ULTRASOUND  04/15/2022   Procedure: VIDEO BRONCHOSCOPY WITH RADIAL ENDOBRONCHIAL ULTRASOUND;  Surgeon: Prudy Brownie, DO;  Location: [facility];  Service: Pulmonary;;   Patient Active Problem List   Diagnosis Date Noted   Bronchitis 12/31/2023   Cough in adult 12/31/2023   Wheezing 12/31/2023   Postoperative hypothyroidism 08/05/2023   Anal fissure 11/13/2022   Perianal abscess 11/12/2022   Diarrhea 11/05/2022   Gastroenteritis 11/05/2022   AKI (acute kidney injury) (HCC) 11/05/2022   Hypokalemia 11/01/2022   LPRD (laryngopharyngeal reflux disease) 09/28/2022   Malignant neoplasm of lower lobe of right lung (HCC) 08/11/2022   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) 04/23/2022   Multiple lung nodules 03/26/2022   Ground glass opacity present on imaging of lung 03/26/2022   Papillary thyroid  carcinoma (HCC) 03/17/2022   Positive self-administered antigen test for COVID-19 10/28/2021   Primary osteoarthritis of right knee 10/08/2020   Pain in right knee 07/25/2020   Malignant neoplasm of lower-inner quadrant of right breast of female, estrogen receptor positive (HCC) 02/23/2019   Age-related nuclear cataract, bilateral 03/01/2018   Vitamin D  deficiency 03/08/2015    COPD (chronic obstructive pulmonary disease) (HCC) 08/11/2014   Fall 12/19/2013   Torn rotator cuff 08/31/2013   Rotator cuff syndrome of left shoulder 07/11/2013   Hyperlipidemia 06/29/2013   Hypertension 06/29/2013   GAD (generalized anxiety disorder) 06/29/2013   Diverticulosis of colon without hemorrhage 06/29/2013   GERD (gastroesophageal reflux disease) 06/29/2013   Insomnia 06/29/2013   Osteopenia 06/29/2013   Epiretinal membrane 02/23/2013   Nuclear cataract 02/23/2013   Presence of unspecified artificial knee joint 10/06/2011   REFERRING PROVIDER: Roise Cleaver MD  REFERRING DIAG: Left buttock pain.  Rationale for Evaluation and Treatment: Rehabilitation  THERAPY DIAG:  Other low back pain  Other muscle spasm  ONSET DATE:   SUBJECTIVE:                                                                                                                                                                                           SUBJECTIVE STATEMENT:  The patient presents presents to the clinic with c/o of some bilateral LBP pain with a CC of bilateral buttock and hip pain.  Her pain is rated at 6/10 and rises to higher levels with sitting on hard chairs and prolonged standing and walking.  Aspirin and Tylenol  decrease her pain.  The pain began several months ago for no apparent reason.  She describes it as an ache and sore.     PERTINENT HISTORY:  Per patient:  OP, h/o cancer, bilateral TKA's.  PAIN:  Are you having pain? Yes: NPRS scale: 5-6/10. Pain location: Bilateral buttocks and hips. Pain description: As above. Aggravating factors: As above. Relieving factors: As above.    PRECAUTIONS: None  RED FLAGS: None   WEIGHT BEARING RESTRICTIONS: No  FALLS:  Has patient fallen in last 6 months? No  LIVING ENVIRONMENT: Lives with: lives with their spouse Lives in: House/apartment Has following equipment at home: None.  But she states she usually uses a  cane.  OCCUPATION: Retired.  PLOF: Independent  PATIENT GOALS: Be able to do more such as walking and standing longer with less pain.      OBJECTIVE:  POSTURE: rounded shoulders, forward head, and flexed trunk   PALPATION: Tender to palpation over bilateral gluteals (max) and Glut med and TFL's   LOWER EXTREMITY ROM:     WNL for bilateral LE's.  LOWER EXTREMITY MMT:    Bilateral hip flexion and abduction is 4/5, knees= 4+/5.  Normal bilateral ankle strength.   FUNCTIONAL TESTS:  5 times sit to stand: 14 seconds Timed up and go (TUG): 13 seconds.  GAIT: The patient walks with a widened BOS  TREATMENT DATE: 04/27/24:  Nustep level 3 x 8 minutes f/b patient in left sdly position with pillow between knees for comfort:  STW/M x 10 minutes to patient's right glut/right lateral hip musculature.  Patient states she felt much better after treatment.                                                                                                                        PATIENT EDUCATION:  Education details:  Person educated:  International aid/development worker:  Education comprehension:   HOME EXERCISE PROGRAM:   ASSESSMENT:  CLINICAL IMPRESSION: The patient presents to [facility] with a CC of bilateral buttock and hip pain over the last several months.  She is palpably tender over her bilateral glutes including the glut med and TFL.  She has some bilateral hip weakness.  Her 5 time sit to stand was 14 seconds and TUG was 13 seconds.  Her pain prohibits her from prolonged walking and standing.  Patient will benefit from skilled physical therapy intervention to address pain and deficits.  OBJECTIVE IMPAIRMENTS: Abnormal gait, decreased activity tolerance, decreased strength, increased muscle spasms, and pain.   ACTIVITY LIMITATIONS: standing and locomotion level  PARTICIPATION LIMITATIONS: meal prep, cleaning, laundry, shopping, community activity, and yard work  PERSONAL FACTORS: Time since  onset of injury/illness/exacerbation and 1 comorbidity: cancer are also affecting patient's functional outcome.   REHAB POTENTIAL: Good  CLINICAL DECISION MAKING: Evolving/moderate complexity  EVALUATION COMPLEXITY: Moderate   GOALS:  SHORT TERM GOALS: Target date: 05/11/24  Ind with an initial HEP. Goal status: INITIAL  LONG TERM GOALS: Target date: 05/25/24  Ind with an advanced HEP.  Goal status: INITIAL  2.  Improve bilateral hip strength to 5/5 for improved stability for functional tasks.    Goal status: INITIAL  3.  Improve TUG to 10 seconds.  Goal status: INITIAL  4.  Improve 5 time sit to stand to 12 seconds Baseline:  Goal status: INITIAL  5.  Stand 20 minutes with pain not > 3/10.  Goal status: INITIAL  6.  Walk a community distance with pain not > 3/10.  Goal status: INITIAL  PLAN:  PT FREQUENCY: 2x/week  PT DURATION: 4 weeks  PLANNED INTERVENTIONS: 97110-Therapeutic exercises, 97530- Therapeutic activity, [id number]- Neuromuscular re-education, 97535- Self Care, and 16109- Manual therapy.  PLAN FOR NEXT SESSION: Nustep, LE strengthening, SKTC, hip bridges, STW/M.   Nickey Canedo, [place], PT 04/27/2024, 2:21 PM

## 2024-04-28 NOTE — Telephone Encounter (Signed)
Noted  -LS

## 2024-04-28 NOTE — Telephone Encounter (Signed)
 Per Dr. Evalene Hilda office - Patient is scheduled on 05/09/2024 and she is aware of Appt.

## 2024-05-04 DIAGNOSIS — R92323 Mammographic fibroglandular density, bilateral breasts: Secondary | ICD-10-CM | POA: Diagnosis not present

## 2024-05-04 DIAGNOSIS — C50311 Malignant neoplasm of lower-inner quadrant of right female breast: Secondary | ICD-10-CM | POA: Diagnosis not present

## 2024-05-04 DIAGNOSIS — N6012 Diffuse cystic mastopathy of left breast: Secondary | ICD-10-CM | POA: Diagnosis not present

## 2024-05-04 DIAGNOSIS — Z170 Estrogen receptor positive status [ER+]: Secondary | ICD-10-CM | POA: Diagnosis not present

## 2024-05-05 ENCOUNTER — Ambulatory Visit: Admitting: Physical Therapy

## 2024-05-05 ENCOUNTER — Encounter: Payer: Self-pay | Admitting: Physical Therapy

## 2024-05-05 DIAGNOSIS — M5459 Other low back pain: Secondary | ICD-10-CM | POA: Diagnosis not present

## 2024-05-05 DIAGNOSIS — M62838 Other muscle spasm: Secondary | ICD-10-CM

## 2024-05-05 DIAGNOSIS — M7918 Myalgia, other site: Secondary | ICD-10-CM | POA: Diagnosis not present

## 2024-05-05 NOTE — Therapy (Signed)
 OUTPATIENT PHYSICAL THERAPY THORACOLUMBAR TREATMENT  Patient Name: Renee Maldonado MRN: [id number] DOB:[birth year], 78 y.o., female Today's Date: 05/05/2024  END OF SESSION:  PT End of Session - 05/05/24 1207     Visit Number 2    Number of Visits 8    Date for PT Re-Evaluation 05/25/24    PT Start Time 1050    PT Stop Time 1148    PT Time Calculation (min) 58 min    Activity Tolerance Patient tolerated treatment well    Behavior During Therapy WFL for tasks assessed/performed           Past Medical History:  Diagnosis Date   Anxiety    Arthritis    Cancer (HCC)    breast Right no chemo or radiation  on Arimidex    COPD (chronic obstructive pulmonary disease) (HCC)    on Breo   GERD (gastroesophageal reflux disease)    Humerus fracture 2007   Hyperlipidemia    Hypertension    Insomnia    Lung cancer (HCC)    OA (osteoarthritis) of knee 10/08/2020   Thyroid  cancer (HCC) 2023   Past Surgical History:  Procedure Laterality Date   BREAST LUMPECTOMY Right 02/2019   BREAST SURGERY Right 01/2019   breast biopsy   BRONCHIAL BIOPSY  04/15/2022   Procedure: BRONCHIAL BIOPSIES;  Surgeon: Prudy Brownie, DO;  Location: [facility];  Service: Pulmonary;;   BRONCHIAL BRUSHINGS  04/15/2022   Procedure: BRONCHIAL BRUSHINGS;  Surgeon: Prudy Brownie, DO;  Location: [facility];  Service: Pulmonary;;   BRONCHIAL NEEDLE ASPIRATION BIOPSY  04/15/2022   Procedure: BRONCHIAL NEEDLE ASPIRATION BIOPSIES;  Surgeon: Prudy Brownie, DO;  Location: [facility];  Service: Pulmonary;;   COLONOSCOPY  2022   ESOPHAGOGASTRODUODENOSCOPY (EGD) WITH PROPOFOL  N/A 01/02/2023   Procedure: ESOPHAGOGASTRODUODENOSCOPY (EGD) WITH PROPOFOL ;  Surgeon: Vinetta Greening, DO;  [facility];  Service: Endoscopy;  Laterality: N/A;  12:45 pm, pt can't come earlier   FIDUCIAL MARKER PLACEMENT  04/15/2022   Procedure: FIDUCIAL MARKER PLACEMENT;  Surgeon: Prudy Brownie, DO;  Location: [facility];  Service: Pulmonary;;   JOINT REPLACEMENT     left knee   Left shoulder surgery     2007   RADICAL NECK DISSECTION Right 10/10/2022   Procedure: CENTRAL NECK DISSECTION;  Surgeon: Rush Coupe, MD;  Location: [facility] OR;  Service: ENT;  Laterality: Right;   THYROIDECTOMY Right 10/10/2022   Procedure: RIGHT THYROID  LOBECTOMY;  Surgeon: Rush Coupe, MD;  Location: [facility];  Service: ENT;  Laterality: Right;   TOTAL KNEE ARTHROPLASTY     2012 left   TOTAL KNEE ARTHROPLASTY Right 10/08/2020   Procedure: TOTAL KNEE ARTHROPLASTY;  Surgeon: Liliane Rei, MD;  Location: [facility];  Service: Orthopedics;  Laterality: Right;   TUBAL LIGATION  1978   VIDEO BRONCHOSCOPY WITH RADIAL ENDOBRONCHIAL ULTRASOUND  04/15/2022   Procedure: VIDEO BRONCHOSCOPY WITH RADIAL ENDOBRONCHIAL ULTRASOUND;  Surgeon: Prudy Brownie, DO;  Location: [facility];  Service: Pulmonary;;   Patient Active Problem List   Diagnosis Date Noted   Bronchitis 12/31/2023   Cough in adult 12/31/2023   Wheezing 12/31/2023   Postoperative hypothyroidism 08/05/2023   Anal fissure 11/13/2022   Perianal abscess 11/12/2022   Diarrhea 11/05/2022   Gastroenteritis 11/05/2022   AKI (acute kidney injury) (HCC) 11/05/2022   Hypokalemia 11/01/2022   LPRD (laryngopharyngeal reflux disease) 09/28/2022   Malignant neoplasm of lower lobe of right lung (HCC) 08/11/2022   Primary adnenocarcinoma of right lower  lobe of lung (HCC) 04/23/2022   Multiple lung nodules 03/26/2022   Ground glass opacity present on imaging of lung 03/26/2022   Papillary thyroid  carcinoma (HCC) 03/17/2022   Positive self-administered antigen test for COVID-19 10/28/2021   Primary osteoarthritis of right knee 10/08/2020   Pain in right knee 07/25/2020   Malignant neoplasm of lower-inner quadrant of right breast of female, estrogen receptor positive (HCC) 02/23/2019   Age-related nuclear cataract, bilateral 03/01/2018   Vitamin D  deficiency 03/08/2015    COPD (chronic obstructive pulmonary disease) (HCC) 08/11/2014   Fall 12/19/2013   Torn rotator cuff 08/31/2013   Rotator cuff syndrome of left shoulder 07/11/2013   Hyperlipidemia 06/29/2013   Hypertension 06/29/2013   GAD (generalized anxiety disorder) 06/29/2013   Diverticulosis of colon without hemorrhage 06/29/2013   GERD (gastroesophageal reflux disease) 06/29/2013   Insomnia 06/29/2013   Osteopenia 06/29/2013   Epiretinal membrane 02/23/2013   Nuclear cataract 02/23/2013   Presence of unspecified artificial knee joint 10/06/2011   REFERRING PROVIDER: Roise Cleaver MD  REFERRING DIAG: Left buttock pain.  Rationale for Evaluation and Treatment: Rehabilitation  THERAPY DIAG:  Other low back pain  Other muscle spasm  ONSET DATE:   SUBJECTIVE:                                                                                                                                                                                           SUBJECTIVE STATEMENT:  Pain in buttocks, left is worse, 8-9/10.  PERTINENT HISTORY:  Per patient:  OP, h/o cancer, bilateral TKA's.  PAIN:  Are you having pain? Yes: NPRS scale: 8-9/10. Pain location: Bilateral buttocks and hips. Pain description: As above. Aggravating factors: As above. Relieving factors: As above.    PRECAUTIONS: None  RED FLAGS: None   WEIGHT BEARING RESTRICTIONS: No  FALLS:  Has patient fallen in last 6 months? No  LIVING ENVIRONMENT: Lives with: lives with their spouse Lives in: House/apartment Has following equipment at home: None.  But she states she usually uses a cane.  OCCUPATION: Retired.  PLOF: Independent  PATIENT GOALS: Be able to do more such as walking and standing longer with less pain.      OBJECTIVE:   POSTURE: rounded shoulders, forward head, and flexed trunk   PALPATION: Tender to palpation over bilateral gluteals (max) and Glut med and TFL's   LOWER EXTREMITY ROM:     WNL for  bilateral LE's.  LOWER EXTREMITY MMT:    Bilateral hip flexion and abduction is 4/5, knees= 4+/5.  Normal bilateral ankle strength.   FUNCTIONAL TESTS:  5 times sit to stand: 14 seconds Timed up  and go (TUG): 13 seconds.  GAIT: The patient walks with a widened BOS  TREATMENT DATE:   05/05/24:  Nustep level 3 x 15 minutes f/b patient in left sdly position with pillow between knees for comfort:  STW/M x 10 minutes to patient's left gluteal region with ischemic release technique utilized f/b IFC at 80-150 Hz on 40% scan x 20 minutes.  Therex:  In supine:  SKTC (bilateral) x 1 minute, 2 sets and DKTC x 1 minute.   04/27/24:  Nustep level 3 x 8 minutes f/b patient in left sdly position with pillow between knees for comfort:  STW/M x 10 minutes to patient's right glut/right lateral hip musculature.  Patient states she felt much better after treatment.                                                                                                                        PATIENT EDUCATION:  Education details: See below. Person educated: Patient. Education method: Handout Education comprehension: Multimedia programmer.  HOME EXERCISE PROGRAM: SKTC  [[id number]]  SINGLE KNEE TO CHEST STRETCH - SKTC -  Repeat 3 Repetitions, Hold 1 Minute, Complete 1 Set, Perform 3 Times a Day  DOUBLE KNEE TO CHEST STRETCH - DKTC -  Repeat 2 Repetitions, Hold 1 Minute, Complete 1 Set, Perform 3 Times a Day  Bridges -  Repeat 15 Repetitions, Hold 2 Seconds, Complete 2 Sets, Perform 2 Times a Day  ASSESSMENT:  CLINICAL IMPRESSION: The patient with a trigger in right gluteal musculature.  Established HEP which she performed with excellent technique.  She felt much better after treatment with a post-treatment pain-level of 3-4/10.    OBJECTIVE IMPAIRMENTS: Abnormal gait, decreased activity tolerance, decreased strength, increased muscle spasms, and pain.   ACTIVITY LIMITATIONS: standing and locomotion  level  PARTICIPATION LIMITATIONS: meal prep, cleaning, laundry, shopping, community activity, and yard work  PERSONAL FACTORS: Time since onset of injury/illness/exacerbation and 1 comorbidity: cancer are also affecting patient's functional outcome.   REHAB POTENTIAL: Good  CLINICAL DECISION MAKING: Evolving/moderate complexity  EVALUATION COMPLEXITY: Moderate   GOALS:  SHORT TERM GOALS: Target date: 05/11/24  Ind with an initial HEP. Goal status: INITIAL  LONG TERM GOALS: Target date: 05/25/24  Ind with an advanced HEP.  Goal status: INITIAL  2.  Improve bilateral hip strength to 5/5 for improved stability for functional tasks.    Goal status: INITIAL  3.  Improve TUG to 10 seconds.  Goal status: INITIAL  4.  Improve 5 time sit to stand to 12 seconds Baseline:  Goal status: INITIAL  5.  Stand 20 minutes with pain not > 3/10.  Goal status: INITIAL  6.  Walk a community distance with pain not > 3/10.  Goal status: INITIAL  PLAN:  PT FREQUENCY: 2x/week  PT DURATION: 4 weeks  PLANNED INTERVENTIONS: 97110-Therapeutic exercises, 97530- Therapeutic activity, [id number]- Neuromuscular re-education, 97535- Self Care, and 16109- Manual therapy.  PLAN FOR NEXT SESSION: Nustep, LE strengthening, SKTC, hip bridges,  STW/M.   Bedford Winsor, [place], PT 05/05/2024, 12:09 PM

## 2024-05-09 DIAGNOSIS — H35371 Puckering of macula, right eye: Secondary | ICD-10-CM | POA: Diagnosis not present

## 2024-05-09 DIAGNOSIS — H259 Unspecified age-related cataract: Secondary | ICD-10-CM | POA: Diagnosis not present

## 2024-05-09 DIAGNOSIS — H353131 Nonexudative age-related macular degeneration, bilateral, early dry stage: Secondary | ICD-10-CM | POA: Diagnosis not present

## 2024-05-10 ENCOUNTER — Ambulatory Visit: Admitting: Physical Therapy

## 2024-05-10 DIAGNOSIS — M5459 Other low back pain: Secondary | ICD-10-CM | POA: Diagnosis not present

## 2024-05-10 DIAGNOSIS — M7918 Myalgia, other site: Secondary | ICD-10-CM | POA: Diagnosis not present

## 2024-05-10 DIAGNOSIS — M62838 Other muscle spasm: Secondary | ICD-10-CM

## 2024-05-10 NOTE — Therapy (Signed)
 OUTPATIENT PHYSICAL THERAPY THORACOLUMBAR TREATMENT  Patient Name: Renee Maldonado MRN: [id number] DOB:10-26-1946, 78 y.o., female Today's Date: 05/10/2024  END OF SESSION:  PT End of Session - 05/10/24 1512     Visit Number 3    Number of Visits 8    Date for PT Re-Evaluation 05/25/24    PT Start Time 0230    PT Stop Time 0328    PT Time Calculation (min) 58 min    Activity Tolerance Patient tolerated treatment well    Behavior During Therapy [facility] for tasks assessed/performed            Past Medical History:  Diagnosis Date   Anxiety    Arthritis    Cancer (HCC)    breast Right no chemo or radiation  on Arimidex    COPD (chronic obstructive pulmonary disease) (HCC)    on Breo   GERD (gastroesophageal reflux disease)    Humerus fracture 2007   Hyperlipidemia    Hypertension    Insomnia    Lung cancer (HCC)    OA (osteoarthritis) of knee 10/08/2020   Thyroid  cancer (HCC) 2023   Past Surgical History:  Procedure Laterality Date   BREAST LUMPECTOMY Right 02/2019   BREAST SURGERY Right 01/2019   breast biopsy   BRONCHIAL BIOPSY  04/15/2022   Procedure: BRONCHIAL BIOPSIES;  Surgeon: Brenna Adine CROME, DO;  Location: [facility];  Service: Pulmonary;;   BRONCHIAL BRUSHINGS  04/15/2022   Procedure: BRONCHIAL BRUSHINGS;  Surgeon: Brenna Adine CROME, DO;  Location: [facility];  Service: Pulmonary;;   BRONCHIAL NEEDLE ASPIRATION BIOPSY  04/15/2022   Procedure: BRONCHIAL NEEDLE ASPIRATION BIOPSIES;  Surgeon: Brenna Adine CROME, DO;  Location: [facility];  Service: Pulmonary;;   COLONOSCOPY  2022   ESOPHAGOGASTRODUODENOSCOPY (EGD) WITH PROPOFOL  N/A 01/02/2023   Procedure: ESOPHAGOGASTRODUODENOSCOPY (EGD) WITH PROPOFOL ;  Surgeon: Cindie Carlin POUR, DO;  [facility];  Service: Endoscopy;  Laterality: N/A;  12:45 pm, pt can't come earlier   FIDUCIAL MARKER PLACEMENT  04/15/2022   Procedure: FIDUCIAL MARKER PLACEMENT;  Surgeon: Brenna Adine CROME, DO;  [facility]: [facility];  Service: Pulmonary;;   JOINT REPLACEMENT     left knee   Left shoulder surgery     2007   RADICAL NECK DISSECTION Right 10/10/2022   Procedure: CENTRAL NECK DISSECTION;  Surgeon: Luciano Standing, MD;  Location: [facility] OR;  Service: ENT;  Laterality: Right;   THYROIDECTOMY Right 10/10/2022   Procedure: RIGHT THYROID  LOBECTOMY;  Surgeon: Luciano Standing, MD;  Location: [facility] OR;  Service: ENT;  Laterality: Right;   TOTAL KNEE ARTHROPLASTY     2012 left   TOTAL KNEE ARTHROPLASTY Right 10/08/2020   Procedure: TOTAL KNEE ARTHROPLASTY;  Surgeon: Melodi Lerner, MD;  Location: [facility];  Service: Orthopedics;  Laterality: Right;   TUBAL LIGATION  1978   VIDEO BRONCHOSCOPY WITH RADIAL ENDOBRONCHIAL ULTRASOUND  04/15/2022   Procedure: VIDEO BRONCHOSCOPY WITH RADIAL ENDOBRONCHIAL ULTRASOUND;  Surgeon: Brenna Adine CROME, DO;  Location: [facility];  Service: Pulmonary;;   Patient Active Problem List   Diagnosis Date Noted   Bronchitis 12/31/2023   Cough in adult 12/31/2023   Wheezing 12/31/2023   Postoperative hypothyroidism 08/05/2023   Anal fissure 11/13/2022   Perianal abscess 11/12/2022   Diarrhea 11/05/2022   Gastroenteritis 11/05/2022   AKI (acute kidney injury) (HCC) 11/05/2022   Hypokalemia 11/01/2022   LPRD (laryngopharyngeal reflux disease) 09/28/2022   Malignant neoplasm of lower lobe of right lung (HCC) 08/11/2022   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) 04/23/2022   Multiple lung nodules 03/26/2022   Ground glass opacity present on imaging of lung 03/26/2022   Papillary thyroid  carcinoma (HCC) 03/17/2022   Positive self-administered antigen test for COVID-19 10/28/2021   Primary osteoarthritis of right knee 10/08/2020   Pain in right knee 07/25/2020   Malignant neoplasm of lower-inner quadrant of right breast of female, estrogen receptor positive (HCC) 02/23/2019   Age-related nuclear cataract, bilateral 03/01/2018   Vitamin D  deficiency 03/08/2015    COPD (chronic obstructive pulmonary disease) (HCC) 08/11/2014   Fall 12/19/2013   Torn rotator cuff 08/31/2013   Rotator cuff syndrome of left shoulder 07/11/2013   Hyperlipidemia 06/29/2013   Hypertension 06/29/2013   GAD (generalized anxiety disorder) 06/29/2013   Diverticulosis of colon without hemorrhage 06/29/2013   GERD (gastroesophageal reflux disease) 06/29/2013   Insomnia 06/29/2013   Osteopenia 06/29/2013   Epiretinal membrane 02/23/2013   Nuclear cataract 02/23/2013   Presence of unspecified artificial knee joint 10/06/2011   REFERRING PROVIDER: Butler Der MD  REFERRING DIAG: Left buttock pain.  Rationale for Evaluation and Treatment: Rehabilitation  THERAPY DIAG:  Other low back pain  Other muscle spasm  ONSET DATE:   SUBJECTIVE:                                                                                                                                                                                           SUBJECTIVE STATEMENT:  Pain about a 5 today.     PERTINENT HISTORY:  Per patient:  OP, h/o cancer, bilateral TKA's.  PAIN:  Are you having pain? Yes: NPRS scale: 5/10. Pain location: Bilateral buttocks and hips. Pain description: As above. Aggravating factors: As above. Relieving factors: As above.    PRECAUTIONS: None  RED FLAGS: None   WEIGHT BEARING RESTRICTIONS: No  FALLS:  Has patient fallen in last 6 months? No  LIVING ENVIRONMENT: Lives with: lives with their spouse Lives in: House/apartment Has following equipment at home: None.  But she states she usually uses a cane.  OCCUPATION: Retired.  PLOF: Independent  PATIENT GOALS: Be able to do more such as walking and standing longer with less pain.      OBJECTIVE:   POSTURE: rounded shoulders, forward head, and flexed trunk   PALPATION: Tender to palpation over bilateral gluteals (max) and Glut med and TFL's   LOWER EXTREMITY ROM:     WNL for bilateral  LE's.  LOWER EXTREMITY MMT:    Bilateral hip flexion and abduction is 4/5, knees= 4+/5.  Normal bilateral ankle strength.   FUNCTIONAL TESTS:  5 times sit to stand: 14 seconds  Timed up and go (TUG): 13 seconds.  GAIT: The patient walks with a widened BOS  TREATMENT DATE:   05/10/24:  Nustep level 3 x 15 minutes f/b positional traction in right sdly position with small bolster x 12 minutes while receiving STW/M/QL release x 12 minutes f/b IFC at 80-150 Hz on 40% scan x 20 minutes.  Normal modality response following removal of modality.  05/05/24:  Nustep level 3 x 15 minutes f/b patient in left sdly position with pillow between knees for comfort:  STW/M x 10 minutes to patient's left gluteal region with ischemic release technique utilized f/b IFC at 80-150 Hz on 40% scan x 20 minutes.  Therex:  In supine:  SKTC (bilateral) x 1 minute, 2 sets and DKTC x 1 minute.   04/27/24:  Nustep level 3 x 8 minutes f/b patient in left sdly position with pillow between knees for comfort:  STW/M x 10 minutes to patient's right glut/right lateral hip musculature.  Patient states she felt much better after treatment.                                                                                                                        PATIENT EDUCATION:  Education details: See below. Person educated: Patient. Education method: Handout Education comprehension: Multimedia programmer.  HOME EXERCISE PROGRAM: SKTC  [[id number]]  SINGLE KNEE TO CHEST STRETCH - SKTC -  Repeat 3 Repetitions, Hold 1 Minute, Complete 1 Set, Perform 3 Times a Day  DOUBLE KNEE TO CHEST STRETCH - DKTC -  Repeat 2 Repetitions, Hold 1 Minute, Complete 1 Set, Perform 3 Times a Day  Bridges -  Repeat 15 Repetitions, Hold 2 Seconds, Complete 2 Sets, Perform 2 Times a Day  ASSESSMENT:  CLINICAL IMPRESSION: Patient had a very good response to positional traction and had a significant reduction in pain following. No pain reported after  treatment.  OBJECTIVE IMPAIRMENTS: Abnormal gait, decreased activity tolerance, decreased strength, increased muscle spasms, and pain.   ACTIVITY LIMITATIONS: standing and locomotion level  PARTICIPATION LIMITATIONS: meal prep, cleaning, laundry, shopping, community activity, and yard work  PERSONAL FACTORS: Time since onset of injury/illness/exacerbation and 1 comorbidity: cancer are also affecting patient's functional outcome.   REHAB POTENTIAL: Good  CLINICAL DECISION MAKING: Evolving/moderate complexity  EVALUATION COMPLEXITY: Moderate   GOALS:  SHORT TERM GOALS: Target date: 05/11/24  Ind with an initial HEP. Goal status: INITIAL  LONG TERM GOALS: Target date: 05/25/24  Ind with an advanced HEP.  Goal status: INITIAL  2.  Improve bilateral hip strength to 5/5 for improved stability for functional tasks.    Goal status: INITIAL  3.  Improve TUG to 10 seconds.  Goal status: INITIAL  4.  Improve 5 time sit to stand to 12 seconds Baseline:  Goal status: INITIAL  5.  Stand 20 minutes with pain not > 3/10.  Goal status: INITIAL  6.  Walk a community distance with pain not > 3/10.  Goal status: INITIAL  PLAN:  PT FREQUENCY: 2x/week  PT DURATION: 4 weeks  PLANNED INTERVENTIONS: 97110-Therapeutic exercises, 97530- Therapeutic activity, [id number]- Neuromuscular re-education, 97535- Self Care, and [id number]- Manual therapy.  PLAN FOR NEXT SESSION: Nustep, LE strengthening, SKTC, hip bridges, STW/M.   Emillia Weatherly, [place], PT 05/10/2024, 3:32 PM

## 2024-05-12 ENCOUNTER — Ambulatory Visit: Admitting: *Deleted

## 2024-05-12 ENCOUNTER — Encounter: Payer: Self-pay | Admitting: *Deleted

## 2024-05-12 DIAGNOSIS — M62838 Other muscle spasm: Secondary | ICD-10-CM

## 2024-05-12 DIAGNOSIS — M7918 Myalgia, other site: Secondary | ICD-10-CM | POA: Diagnosis not present

## 2024-05-12 DIAGNOSIS — M5459 Other low back pain: Secondary | ICD-10-CM | POA: Diagnosis not present

## 2024-05-12 NOTE — Therapy (Signed)
 OUTPATIENT PHYSICAL THERAPY THORACOLUMBAR TREATMENT  Patient Name: Renee Maldonado MRN: [id number] DOB:1946/02/14, 78 y.o., female Today's Date: 05/12/2024  END OF SESSION:  PT End of Session - 05/12/24 1017     Visit Number 4    Number of Visits 8    Date for PT Re-Evaluation 05/25/24    PT Start Time 1017    PT Stop Time 1107    PT Time Calculation (min) 50 min            Past Medical History:  Diagnosis Date   Anxiety    Arthritis    Cancer (HCC)    breast Right no chemo or radiation  on Arimidex    COPD (chronic obstructive pulmonary disease) (HCC)    on Breo   GERD (gastroesophageal reflux disease)    Humerus fracture 2007   Hyperlipidemia    Hypertension    Insomnia    Lung cancer (HCC)    OA (osteoarthritis) of knee 10/08/2020   Thyroid  cancer (HCC) 2023   Past Surgical History:  Procedure Laterality Date   BREAST LUMPECTOMY Right 02/2019   BREAST SURGERY Right 01/2019   breast biopsy   BRONCHIAL BIOPSY  04/15/2022   Procedure: BRONCHIAL BIOPSIES;  Surgeon: Brenna Adine CROME, DO;  [facility]: [facility];  Service: Pulmonary;;   BRONCHIAL BRUSHINGS  04/15/2022   Procedure: BRONCHIAL BRUSHINGS;  Surgeon: Brenna Adine CROME, DO;  Location: [facility];  Service: Pulmonary;;   BRONCHIAL NEEDLE ASPIRATION BIOPSY  04/15/2022   Procedure: BRONCHIAL NEEDLE ASPIRATION BIOPSIES;  Surgeon: Brenna Adine CROME, DO;  Location: [facility];  Service: Pulmonary;;   COLONOSCOPY  2022   ESOPHAGOGASTRODUODENOSCOPY (EGD) WITH PROPOFOL  N/A 01/02/2023   Procedure: ESOPHAGOGASTRODUODENOSCOPY (EGD) WITH PROPOFOL ;  Surgeon: Cindie Carlin POUR, DO;  [facility];  Service: Endoscopy;  Laterality: N/A;  12:45 pm, pt can't come earlier   FIDUCIAL MARKER PLACEMENT  04/15/2022   Procedure: FIDUCIAL MARKER PLACEMENT;  Surgeon: Brenna Adine CROME, DO;  [facility]: [facility];  Service: Pulmonary;;   JOINT REPLACEMENT     left knee   Left shoulder surgery     2007   RADICAL NECK  DISSECTION Right 10/10/2022   Procedure: CENTRAL NECK DISSECTION;  Surgeon: Luciano Standing, MD;  Location: [facility] OR;  Service: ENT;  Laterality: Right;   THYROIDECTOMY Right 10/10/2022   Procedure: RIGHT THYROID  LOBECTOMY;  Surgeon: Luciano Standing, MD;  Location: [facility];  Service: ENT;  Laterality: Right;   TOTAL KNEE ARTHROPLASTY     2012 left   TOTAL KNEE ARTHROPLASTY Right 10/08/2020   Procedure: TOTAL KNEE ARTHROPLASTY;  Surgeon: Melodi Lerner, MD;  Location: [facility];  Service: Orthopedics;  Laterality: Right;   TUBAL LIGATION  1978   VIDEO BRONCHOSCOPY WITH RADIAL ENDOBRONCHIAL ULTRASOUND  04/15/2022   Procedure: VIDEO BRONCHOSCOPY WITH RADIAL ENDOBRONCHIAL ULTRASOUND;  Surgeon: Brenna Adine CROME, DO;  Location: [facility];  Service: Pulmonary;;   Patient Active Problem List   Diagnosis Date Noted   Bronchitis 12/31/2023   Cough in adult 12/31/2023   Wheezing 12/31/2023   Postoperative hypothyroidism 08/05/2023   Anal fissure 11/13/2022   Perianal abscess 11/12/2022   Diarrhea 11/05/2022   Gastroenteritis 11/05/2022   AKI (acute kidney injury) (HCC) 11/05/2022   Hypokalemia 11/01/2022   LPRD (laryngopharyngeal reflux disease) 09/28/2022   Malignant neoplasm of lower lobe of right lung (HCC) 08/11/2022   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) 04/23/2022   Multiple lung nodules 03/26/2022   Ground glass opacity present on  imaging of lung 03/26/2022   Papillary thyroid  carcinoma (HCC) 03/17/2022   Positive self-administered antigen test for COVID-19 10/28/2021   Primary osteoarthritis of right knee 10/08/2020   Pain in right knee 07/25/2020   Malignant neoplasm of lower-inner quadrant of right breast of female, estrogen receptor positive (HCC) 02/23/2019   Age-related nuclear cataract, bilateral 03/01/2018   Vitamin D  deficiency 03/08/2015   COPD (chronic obstructive pulmonary disease) (HCC) 08/11/2014   Fall 12/19/2013   Torn rotator cuff 08/31/2013   Rotator  cuff syndrome of left shoulder 07/11/2013   Hyperlipidemia 06/29/2013   Hypertension 06/29/2013   GAD (generalized anxiety disorder) 06/29/2013   Diverticulosis of colon without hemorrhage 06/29/2013   GERD (gastroesophageal reflux disease) 06/29/2013   Insomnia 06/29/2013   Osteopenia 06/29/2013   Epiretinal membrane 02/23/2013   Nuclear cataract 02/23/2013   Presence of unspecified artificial knee joint 10/06/2011   REFERRING PROVIDER: Butler Der MD  REFERRING DIAG: Left buttock pain.  Rationale for Evaluation and Treatment: Rehabilitation  THERAPY DIAG:  Other low back pain  Other muscle spasm  ONSET DATE:   SUBJECTIVE:                                                                                                                                                                                           SUBJECTIVE STATEMENT:  Pain about a 3-4/10 today with meds. Did good after last Rx     PERTINENT HISTORY:  Per patient:  OP, h/o cancer, bilateral TKA's.  PAIN:  Are you having pain? Yes: NPRS scale: 5/10. Pain location: Bilateral buttocks and hips. Pain description: As above. Aggravating factors: As above. Relieving factors: As above.    PRECAUTIONS: None  RED FLAGS: None   WEIGHT BEARING RESTRICTIONS: No  FALLS:  Has patient fallen in last 6 months? No  LIVING ENVIRONMENT: Lives with: lives with their spouse Lives in: House/apartment Has following equipment at home: None.  But she states she usually uses a cane.  OCCUPATION: Retired.  PLOF: Independent  PATIENT GOALS: Be able to do more such as walking and standing longer with less pain.      OBJECTIVE:   POSTURE: rounded shoulders, forward head, and flexed trunk   PALPATION: Tender to palpation over bilateral gluteals (max) and Glut med and TFL's   LOWER EXTREMITY ROM:     WNL for bilateral LE's.  LOWER EXTREMITY MMT:    Bilateral hip flexion and abduction is 4/5, knees= 4+/5.   Normal bilateral ankle strength.   FUNCTIONAL TESTS:  5 times sit to stand: 14 seconds Timed up and go (TUG): 13 seconds.  GAIT: The patient walks  with a widened BOS  TREATMENT DATE:   05/12/24:   Nustep level 3 x 16 minutes   positional traction in right sdly position with small bolster x 15 minutes while receiving STW/M/ TPR to LT QL  and piriformis Discussed  ADL's and changing movement patterns to decrease pain triggers to help desensitize. Also discussed AD to help with ADLs  IFC at 80-150 Hz on 59% scan x 15 minutes.  Normal modality response following removal of modality.  05/05/24:  Nustep level 3 x 15 minutes f/b patient in left sdly position with pillow between knees for comfort:  STW/M x 10 minutes to patient's left gluteal region with ischemic release technique utilized f/b IFC at 80-150 Hz on 40% scan x 20 minutes.  Therex:  In supine:  SKTC (bilateral) x 1 minute, 2 sets and DKTC x 1 minute.   04/27/24:  Nustep level 3 x 8 minutes f/b patient in left sdly position with pillow between knees for comfort:  STW/M x 10 minutes to patient's right glut/right lateral hip musculature.  Patient states she felt much better after treatment.                                                                                                                        PATIENT EDUCATION:  Education details: See below. Person educated: Patient. Education method: Handout Education comprehension: Multimedia programmer.  HOME EXERCISE PROGRAM: SKTC  [[id number]]  SINGLE KNEE TO CHEST STRETCH - SKTC -  Repeat 3 Repetitions, Hold 1 Minute, Complete 1 Set, Perform 3 Times a Day  DOUBLE KNEE TO CHEST STRETCH - DKTC -  Repeat 2 Repetitions, Hold 1 Minute, Complete 1 Set, Perform 3 Times a Day  Bridges -  Repeat 15 Repetitions, Hold 2 Seconds, Complete 2 Sets, Perform 2 Times a Day  ASSESSMENT:  CLINICAL IMPRESSION: Patient arrived today feeling some better after last Rx. Rx focused on therex as well as STW/ TPR  to LT side piriformis  and QL with Pt RT side lying and in positional traction. Also discussed ADL's and movement patterns to decrease pain triggers. IFC as well in RT side lying and positional traction. Pt had notable tenderness and Tps in piriformis and QL with some release. No pain reported after treatment.  OBJECTIVE IMPAIRMENTS: Abnormal gait, decreased activity tolerance, decreased strength, increased muscle spasms, and pain.   ACTIVITY LIMITATIONS: standing and locomotion level  PARTICIPATION LIMITATIONS: meal prep, cleaning, laundry, shopping, community activity, and yard work  PERSONAL FACTORS: Time since onset of injury/illness/exacerbation and 1 comorbidity: cancer are also affecting patient's functional outcome.   REHAB POTENTIAL: Good  CLINICAL DECISION MAKING: Evolving/moderate complexity  EVALUATION COMPLEXITY: Moderate   GOALS:  SHORT TERM GOALS: Target date: 05/11/24  Ind with an initial HEP. Goal status: INITIAL  LONG TERM GOALS: Target date: 05/25/24  Ind with an advanced HEP.  Goal status: INITIAL  2.  Improve bilateral hip strength to 5/5 for improved stability for functional tasks.  Goal status: INITIAL  3.  Improve TUG to 10 seconds.  Goal status: INITIAL  4.  Improve 5 time sit to stand to 12 seconds Baseline:  Goal status: INITIAL  5.  Stand 20 minutes with pain not > 3/10.  Goal status: INITIAL  6.  Walk a community distance with pain not > 3/10.  Goal status: INITIAL  PLAN:  PT FREQUENCY: 2x/week  PT DURATION: 4 weeks  PLANNED INTERVENTIONS: 97110-Therapeutic exercises, 97530- Therapeutic activity, [id number]- Neuromuscular re-education, 97535- Self Care, and 02859- Manual therapy.  PLAN FOR NEXT SESSION: Nustep, LE strengthening, SKTC, hip bridges, STW/M.   Shadeed Colberg,[place], PTA 05/12/2024, 11:07 AM

## 2024-05-19 ENCOUNTER — Ambulatory Visit: Attending: Family Medicine | Admitting: *Deleted

## 2024-05-19 ENCOUNTER — Encounter: Payer: Self-pay | Admitting: *Deleted

## 2024-05-19 DIAGNOSIS — M62838 Other muscle spasm: Secondary | ICD-10-CM | POA: Insufficient documentation

## 2024-05-19 DIAGNOSIS — M5459 Other low back pain: Secondary | ICD-10-CM | POA: Diagnosis not present

## 2024-05-19 NOTE — Therapy (Signed)
 OUTPATIENT PHYSICAL THERAPY THORACOLUMBAR TREATMENT  Patient Name: Renee Maldonado MRN: [id number] DOB:[birth year], 78 y.o., female Today's Date: 05/19/2024  END OF SESSION:  PT End of Session - 05/19/24 1114     Visit Number 5    Number of Visits 8    Date for PT Re-Evaluation 05/25/24    PT Start Time 1100    PT Stop Time 1158    PT Time Calculation (min) 58 min            Past Medical History:  Diagnosis Date   Anxiety    Arthritis    Cancer (HCC)    breast Right no chemo or radiation  on Arimidex    COPD (chronic obstructive pulmonary disease) (HCC)    on Breo   GERD (gastroesophageal reflux disease)    Humerus fracture 2007   Hyperlipidemia    Hypertension    Insomnia    Lung cancer (HCC)    OA (osteoarthritis) of knee 10/08/2020   Thyroid  cancer (HCC) 2023   Past Surgical History:  Procedure Laterality Date   BREAST LUMPECTOMY Right 02/2019   BREAST SURGERY Right 01/2019   breast biopsy   BRONCHIAL BIOPSY  04/15/2022   Procedure: BRONCHIAL BIOPSIES;  Surgeon: Brenna Adine CROME, DO;  Location: [facility];  Service: Pulmonary;;   BRONCHIAL BRUSHINGS  04/15/2022   Procedure: BRONCHIAL BRUSHINGS;  Surgeon: Brenna Adine CROME, DO;  Location: [facility];  Service: Pulmonary;;   BRONCHIAL NEEDLE ASPIRATION BIOPSY  04/15/2022   Procedure: BRONCHIAL NEEDLE ASPIRATION BIOPSIES;  Surgeon: Brenna Adine CROME, DO;  Location: [facility];  Service: Pulmonary;;   COLONOSCOPY  2022   ESOPHAGOGASTRODUODENOSCOPY (EGD) WITH PROPOFOL  N/A 01/02/2023   Procedure: ESOPHAGOGASTRODUODENOSCOPY (EGD) WITH PROPOFOL ;  Surgeon: Cindie Carlin POUR, DO;  Location: [facility];  Service: Endoscopy;  Laterality: N/A;  12:45 pm, pt can't come earlier   FIDUCIAL MARKER PLACEMENT  04/15/2022   Procedure: FIDUCIAL MARKER PLACEMENT;  Surgeon: Brenna Adine CROME, DO;  Location: [facility];  Service: Pulmonary;;   JOINT REPLACEMENT     left knee   Left shoulder surgery     2007   RADICAL NECK  DISSECTION Right 10/10/2022   Procedure: CENTRAL NECK DISSECTION;  Surgeon: Luciano Standing, MD;  Location: [facility] OR;  Service: ENT;  Laterality: Right;   THYROIDECTOMY Right 10/10/2022   Procedure: RIGHT THYROID  LOBECTOMY;  Surgeon: Luciano Standing, MD;  Location: [facility];  Service: ENT;  Laterality: Right;   TOTAL KNEE ARTHROPLASTY     2012 left   TOTAL KNEE ARTHROPLASTY Right 10/08/2020   Procedure: TOTAL KNEE ARTHROPLASTY;  Surgeon: Melodi Lerner, MD;  Location: [facility];  Service: Orthopedics;  Laterality: Right;   TUBAL LIGATION  1978   VIDEO BRONCHOSCOPY WITH RADIAL ENDOBRONCHIAL ULTRASOUND  04/15/2022   Procedure: VIDEO BRONCHOSCOPY WITH RADIAL ENDOBRONCHIAL ULTRASOUND;  Surgeon: Brenna Adine CROME, DO;  Location: [facility];  Service: Pulmonary;;   Patient Active Problem List   Diagnosis Date Noted   Bronchitis 12/31/2023   Cough in adult 12/31/2023   Wheezing 12/31/2023   Postoperative hypothyroidism 08/05/2023   Anal fissure 11/13/2022   Perianal abscess 11/12/2022   Diarrhea 11/05/2022   Gastroenteritis 11/05/2022   AKI (acute kidney injury) (HCC) 11/05/2022   Hypokalemia 11/01/2022   LPRD (laryngopharyngeal reflux disease) 09/28/2022   Malignant neoplasm of lower lobe of right lung (HCC) 08/11/2022   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) 04/23/2022   Multiple lung nodules 03/26/2022   Ground glass opacity present on  imaging of lung 03/26/2022   Papillary thyroid  carcinoma (HCC) 03/17/2022   Positive self-administered antigen test for COVID-19 10/28/2021   Primary osteoarthritis of right knee 10/08/2020   Pain in right knee 07/25/2020   Malignant neoplasm of lower-inner quadrant of right breast of female, estrogen receptor positive (HCC) 02/23/2019   Age-related nuclear cataract, bilateral 03/01/2018   Vitamin D  deficiency 03/08/2015   COPD (chronic obstructive pulmonary disease) (HCC) 08/11/2014   Fall 12/19/2013   Torn rotator cuff 08/31/2013   Rotator  cuff syndrome of left shoulder 07/11/2013   Hyperlipidemia 06/29/2013   Hypertension 06/29/2013   GAD (generalized anxiety disorder) 06/29/2013   Diverticulosis of colon without hemorrhage 06/29/2013   GERD (gastroesophageal reflux disease) 06/29/2013   Insomnia 06/29/2013   Osteopenia 06/29/2013   Epiretinal membrane 02/23/2013   Nuclear cataract 02/23/2013   Presence of unspecified artificial knee joint 10/06/2011   REFERRING PROVIDER: Butler Der MD  REFERRING DIAG: Left buttock pain.  Rationale for Evaluation and Treatment: Rehabilitation  THERAPY DIAG:  Other low back pain  Other muscle spasm  ONSET DATE:   SUBJECTIVE:                                                                                                                                                                                           SUBJECTIVE STATEMENT:  Pain about a 3-4/10 today with meds. Doing better over all.    PERTINENT HISTORY:  Per patient:  OP, h/o cancer, bilateral TKA's.  PAIN:  Are you having pain? Yes: NPRS scale: 3-4/10. Pain location: Bilateral buttocks and hips. Pain description: As above. Aggravating factors: As above. Relieving factors: As above.    PRECAUTIONS: None  RED FLAGS: None   WEIGHT BEARING RESTRICTIONS: No  FALLS:  Has patient fallen in last 6 months? No  LIVING ENVIRONMENT: Lives with: lives with their spouse Lives in: House/apartment Has following equipment at home: None.  But she states she usually uses a cane.  OCCUPATION: Retired.  PLOF: Independent  PATIENT GOALS: Be able to do more such as walking and standing longer with less pain.      OBJECTIVE:   POSTURE: rounded shoulders, forward head, and flexed trunk   PALPATION: Tender to palpation over bilateral gluteals (max) and Glut med and TFL's   LOWER EXTREMITY ROM:     WNL for bilateral LE's.  LOWER EXTREMITY MMT:    Bilateral hip flexion and abduction is 4/5, knees= 4+/5.   Normal bilateral ankle strength.   FUNCTIONAL TESTS:  5 times sit to stand: 14 seconds Timed up and go (TUG): 13 seconds.  GAIT: The patient walks with a  widened BOS  TREATMENT DATE:   05/19/24:   Nustep level 3 x 19 minutes   positional traction in right sdly position with small bolster x 19 minutes while receiving STW/M/ TPR to LT QL  and piriformis Discussed  ADL's and changing movement patterns to decrease pain triggers to help desensitize. Also discussed AD to help with ADLs  IFC at 80-150 Hz on 59% scan x 15 minutes in RT side lying.  Normal modality response following removal of modality.     05/05/24:  Nustep level 3 x 15 minutes f/b patient in left sdly position with pillow between knees for comfort:  STW/M x 10 minutes to patient's left gluteal region with ischemic release technique utilized f/b IFC at 80-150 Hz on 40% scan x 20 minutes.  Therex:  In supine:  SKTC (bilateral) x 1 minute, 2 sets and DKTC x 1 minute.   04/27/24:  Nustep level 3 x 8 minutes f/b patient in left sdly position with pillow between knees for comfort:  STW/M x 10 minutes to patient's right glut/right lateral hip musculature.  Patient states she felt much better after treatment.                                                                                                                        PATIENT EDUCATION:  Education details: See below. Person educated: Patient. Education method: Handout Education comprehension: Multimedia programmer.  HOME EXERCISE PROGRAM: SKTC  [[id number]]  SINGLE KNEE TO CHEST STRETCH - SKTC -  Repeat 3 Repetitions, Hold 1 Minute, Complete 1 Set, Perform 3 Times a Day  DOUBLE KNEE TO CHEST STRETCH - DKTC -  Repeat 2 Repetitions, Hold 1 Minute, Complete 1 Set, Perform 3 Times a Day  Bridges -  Repeat 15 Repetitions, Hold 2 Seconds, Complete 2 Sets, Perform 2 Times a Day  ASSESSMENT:  CLINICAL IMPRESSION: Patient arrived today feeling some better after last Rx and 3-4/10 pain  today. Rx focused again on therex f/b  STW/ TPR to LT side piriformis  and QL with Pt RT side lying and in positional traction. Also discussed ADL's and movement patterns again  to decrease pain triggers. IFC as well in RT side lying and positional traction. Pt had notable tenderness and Tps in piriformis and QL with some release in both.  OBJECTIVE IMPAIRMENTS: Abnormal gait, decreased activity tolerance, decreased strength, increased muscle spasms, and pain.   ACTIVITY LIMITATIONS: standing and locomotion level  PARTICIPATION LIMITATIONS: meal prep, cleaning, laundry, shopping, community activity, and yard work  PERSONAL FACTORS: Time since onset of injury/illness/exacerbation and 1 comorbidity: cancer are also affecting patient's functional outcome.   REHAB POTENTIAL: Good  CLINICAL DECISION MAKING: Evolving/moderate complexity  EVALUATION COMPLEXITY: Moderate   GOALS:  SHORT TERM GOALS: Target date: 05/11/24  Ind with an initial HEP. Goal status: MET  LONG TERM GOALS: Target date: 05/25/24  Ind with an advanced HEP.  Goal status: On going  2.  Improve bilateral hip strength to 5/5  for improved stability for functional tasks.    Goal status: On going  3.  Improve TUG to 10 seconds.  Goal status: On going  4.  Improve 5 time sit to stand to 12 seconds Baseline:  Goal status: On going  5.  Stand 20 minutes with pain not > 3/10.  Goal status: On going  6.  Walk a community distance with pain not > 3/10.  Goal status: On going  PLAN:  PT FREQUENCY: 2x/week  PT DURATION: 4 weeks  PLANNED INTERVENTIONS: 97110-Therapeutic exercises, 97530- Therapeutic activity, [id number]- Neuromuscular re-education, 97535- Self Care, and [id number]- Manual therapy.  PLAN FOR NEXT SESSION: Nustep, LE strengthening, SKTC, hip bridges, STW/M.   Maryln Eastham,CHRIS, PTA 05/19/2024, 11:57 AM

## 2024-05-23 ENCOUNTER — Ambulatory Visit: Admitting: *Deleted

## 2024-05-23 ENCOUNTER — Encounter: Payer: Self-pay | Admitting: *Deleted

## 2024-05-23 DIAGNOSIS — M5459 Other low back pain: Secondary | ICD-10-CM

## 2024-05-23 DIAGNOSIS — M62838 Other muscle spasm: Secondary | ICD-10-CM

## 2024-05-23 NOTE — Therapy (Signed)
 OUTPATIENT PHYSICAL THERAPY THORACOLUMBAR TREATMENT  Patient Name: Renee Maldonado MRN: [id number] DOB:Jul 09, 1946, 78 y.o., female Today's Date: 05/23/2024  END OF SESSION:  PT End of Session - 05/23/24 1101     Visit Number 6    Number of Visits 8    Date for PT Re-Evaluation 05/25/24    PT Start Time 1100    PT Stop Time 1159    PT Time Calculation (min) 59 min            Past Medical History:  Diagnosis Date   Anxiety    Arthritis    Cancer (HCC)    breast Right no chemo or radiation  on Arimidex    COPD (chronic obstructive pulmonary disease) (HCC)    on Breo   GERD (gastroesophageal reflux disease)    Humerus fracture 2007   Hyperlipidemia    Hypertension    Insomnia    Lung cancer (HCC)    OA (osteoarthritis) of knee 10/08/2020   Thyroid  cancer (HCC) 2023   Past Surgical History:  Procedure Laterality Date   BREAST LUMPECTOMY Right 02/2019   BREAST SURGERY Right 01/2019   breast biopsy   BRONCHIAL BIOPSY  04/15/2022   Procedure: BRONCHIAL BIOPSIES;  Surgeon: Brenna Adine CROME, DO;  Location: [facility];  Service: Pulmonary;;   BRONCHIAL BRUSHINGS  04/15/2022   Procedure: BRONCHIAL BRUSHINGS;  Surgeon: Brenna Adine CROME, DO;  Location: [facility];  Service: Pulmonary;;   BRONCHIAL NEEDLE ASPIRATION BIOPSY  04/15/2022   Procedure: BRONCHIAL NEEDLE ASPIRATION BIOPSIES;  Surgeon: Brenna Adine CROME, DO;  Location: [facility];  Service: Pulmonary;;   COLONOSCOPY  2022   ESOPHAGOGASTRODUODENOSCOPY (EGD) WITH PROPOFOL  N/A 01/02/2023   Procedure: ESOPHAGOGASTRODUODENOSCOPY (EGD) WITH PROPOFOL ;  Surgeon: Cindie Carlin POUR, DO;  Location: [facility];  Service: Endoscopy;  Laterality: N/A;  12:45 pm, pt can't come earlier   FIDUCIAL MARKER PLACEMENT  04/15/2022   Procedure: FIDUCIAL MARKER PLACEMENT;  Surgeon: Brenna Adine CROME, DO;  Location: [facility];  Service: Pulmonary;;   JOINT REPLACEMENT     left knee   Left shoulder surgery     2007   RADICAL NECK  DISSECTION Right 10/10/2022   Procedure: CENTRAL NECK DISSECTION;  Surgeon: Luciano Standing, MD;  Location: [facility] OR;  Service: ENT;  Laterality: Right;   THYROIDECTOMY Right 10/10/2022   Procedure: RIGHT THYROID  LOBECTOMY;  Surgeon: Luciano Standing, MD;  Location: [facility];  Service: ENT;  Laterality: Right;   TOTAL KNEE ARTHROPLASTY     2012 left   TOTAL KNEE ARTHROPLASTY Right 10/08/2020   Procedure: TOTAL KNEE ARTHROPLASTY;  Surgeon: Melodi Lerner, MD;  Location: [facility];  Service: Orthopedics;  Laterality: Right;   TUBAL LIGATION  1978   VIDEO BRONCHOSCOPY WITH RADIAL ENDOBRONCHIAL ULTRASOUND  04/15/2022   Procedure: VIDEO BRONCHOSCOPY WITH RADIAL ENDOBRONCHIAL ULTRASOUND;  Surgeon: Brenna Adine CROME, DO;  Location: [facility];  Service: Pulmonary;;   Patient Active Problem List   Diagnosis Date Noted   Bronchitis 12/31/2023   Cough in adult 12/31/2023   Wheezing 12/31/2023   Postoperative hypothyroidism 08/05/2023   Anal fissure 11/13/2022   Perianal abscess 11/12/2022   Diarrhea 11/05/2022   Gastroenteritis 11/05/2022   AKI (acute kidney injury) (HCC) 11/05/2022   Hypokalemia 11/01/2022   LPRD (laryngopharyngeal reflux disease) 09/28/2022   Malignant neoplasm of lower lobe of right lung (HCC) 08/11/2022   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) 04/23/2022   Multiple lung nodules 03/26/2022   Ground glass opacity present on  imaging of lung 03/26/2022   Papillary thyroid  carcinoma (HCC) 03/17/2022   Positive self-administered antigen test for COVID-19 10/28/2021   Primary osteoarthritis of right knee 10/08/2020   Pain in right knee 07/25/2020   Malignant neoplasm of lower-inner quadrant of right breast of female, estrogen receptor positive (HCC) 02/23/2019   Age-related nuclear cataract, bilateral 03/01/2018   Vitamin D  deficiency 03/08/2015   COPD (chronic obstructive pulmonary disease) (HCC) 08/11/2014   Fall 12/19/2013   Torn rotator cuff 08/31/2013   Rotator  cuff syndrome of left shoulder 07/11/2013   Hyperlipidemia 06/29/2013   Hypertension 06/29/2013   GAD (generalized anxiety disorder) 06/29/2013   Diverticulosis of colon without hemorrhage 06/29/2013   GERD (gastroesophageal reflux disease) 06/29/2013   Insomnia 06/29/2013   Osteopenia 06/29/2013   Epiretinal membrane 02/23/2013   Nuclear cataract 02/23/2013   Presence of unspecified artificial knee joint 10/06/2011   REFERRING PROVIDER: Butler Der MD  REFERRING DIAG: Left buttock pain.  Rationale for Evaluation and Treatment: Rehabilitation  THERAPY DIAG:  Other low back pain  Other muscle spasm  ONSET DATE:   SUBJECTIVE:                                                                                                                                                                                           SUBJECTIVE STATEMENT:  Pain about a 3-4/10 today with meds. Doing better over all.    PERTINENT HISTORY:  Per patient:  OP, h/o cancer, bilateral TKA's.  PAIN:  Are you having pain? Yes: NPRS scale: 3-4/10. Pain location: Bilateral buttocks and hips. Pain description: As above. Aggravating factors: As above. Relieving factors: As above.    PRECAUTIONS: None  RED FLAGS: None   WEIGHT BEARING RESTRICTIONS: No  FALLS:  Has patient fallen in last 6 months? No  LIVING ENVIRONMENT: Lives with: lives with their spouse Lives in: House/apartment Has following equipment at home: None.  But she states she usually uses a cane.  OCCUPATION: Retired.  PLOF: Independent  PATIENT GOALS: Be able to do more such as walking and standing longer with less pain.      OBJECTIVE:   POSTURE: rounded shoulders, forward head, and flexed trunk   PALPATION: Tender to palpation over bilateral gluteals (max) and Glut med and TFL's   LOWER EXTREMITY ROM:     WNL for bilateral LE's.  LOWER EXTREMITY MMT:    Bilateral hip flexion and abduction is 4/5, knees= 4+/5.   Normal bilateral ankle strength.   FUNCTIONAL TESTS:  5 times sit to stand: 14 seconds Timed up and go (TUG): 13 seconds.  GAIT: The patient walks with a  widened BOS  TREATMENT DATE:   05/23/24:  Nustep level 3 x 15 minutes with UE and LE activity Performed positional traction in right sdly position with small blue bolster x 15 minutes while receiving STW/M/ TPR to LT QL  and piriformis x 15 mins   IFC at 80-150 Hz on 40% scan x 15 minutes in RT side lying.  Normal modality response following removal of modality. Discussed Home TENS unit    05/05/24:  Nustep level 3 x 15 minutes f/b patient in left sdly position with pillow between knees for comfort:  STW/M x 10 minutes to patient's left gluteal region with ischemic release technique utilized f/b IFC at 80-150 Hz on 40% scan x 20 minutes.  Therex:  In supine:  SKTC (bilateral) x 1 minute, 2 sets and DKTC x 1 minute.   04/27/24:  Nustep level 3 x 8 minutes f/b patient in left sdly position with pillow between knees for comfort:  STW/M x 10 minutes to patient's right glut/right lateral hip musculature.  Patient states she felt much better after treatment.                                                                                                                        PATIENT EDUCATION:  Education details: See below. Person educated: Patient. Education method: Handout Education comprehension: Multimedia programmer.  HOME EXERCISE PROGRAM: SKTC  [[id number]]  SINGLE KNEE TO CHEST STRETCH - SKTC -  Repeat 3 Repetitions, Hold 1 Minute, Complete 1 Set, Perform 3 Times a Day  DOUBLE KNEE TO CHEST STRETCH - DKTC -  Repeat 2 Repetitions, Hold 1 Minute, Complete 1 Set, Perform 3 Times a Day  Bridges -  Repeat 15 Repetitions, Hold 2 Seconds, Complete 2 Sets, Perform 2 Times a Day  ASSESSMENT:  CLINICAL IMPRESSION: Patient arrived today feeling some better after last Rx and 3-4/10 pain today. Rx focused again on therex f/b  STW/ TPR to LT side  piriformis  and QL with Pt RT side lying and in positional traction over small bolster. IFC as well in RT side lying and positional traction. Pt had notable tenderness and Tps in piriformis and QL again with some release in both.  OBJECTIVE IMPAIRMENTS: Abnormal gait, decreased activity tolerance, decreased strength, increased muscle spasms, and pain.   ACTIVITY LIMITATIONS: standing and locomotion level  PARTICIPATION LIMITATIONS: meal prep, cleaning, laundry, shopping, community activity, and yard work  PERSONAL FACTORS: Time since onset of injury/illness/exacerbation and 1 comorbidity: cancer are also affecting patient's functional outcome.   REHAB POTENTIAL: Good  CLINICAL DECISION MAKING: Evolving/moderate complexity  EVALUATION COMPLEXITY: Moderate   GOALS:  SHORT TERM GOALS: Target date: 05/11/24  Ind with an initial HEP. Goal status: MET  LONG TERM GOALS: Target date: 05/25/24  Ind with an advanced HEP.  Goal status: On going  2.  Improve bilateral hip strength to 5/5 for improved stability for functional tasks.    Goal status: On going  3.  Improve TUG  to 10 seconds.  Goal status: On going  4.  Improve 5 time sit to stand to 12 seconds Baseline:  Goal status: On going  5.  Stand 20 minutes with pain not > 3/10.  Goal status: On going  6.  Walk a community distance with pain not > 3/10.  Goal status: On going  PLAN:  PT FREQUENCY: 2x/week  PT DURATION: 4 weeks  PLANNED INTERVENTIONS: 97110-Therapeutic exercises, 97530- Therapeutic activity, [id number]- Neuromuscular re-education, 97535- Self Care, and 02859- Manual therapy.  PLAN FOR NEXT SESSION: Nustep, LE strengthening, SKTC, hip bridges, STW/M.   Radley Barto,CHRIS, PTA 05/23/2024, 12:55 PM

## 2024-05-25 ENCOUNTER — Encounter: Payer: Self-pay | Admitting: Physical Therapy

## 2024-05-25 ENCOUNTER — Ambulatory Visit: Admitting: Physical Therapy

## 2024-05-25 DIAGNOSIS — M62838 Other muscle spasm: Secondary | ICD-10-CM | POA: Diagnosis not present

## 2024-05-25 DIAGNOSIS — M5459 Other low back pain: Secondary | ICD-10-CM | POA: Diagnosis not present

## 2024-05-25 NOTE — Therapy (Signed)
 OUTPATIENT PHYSICAL THERAPY THORACOLUMBAR TREATMENT  Patient Name: Renee Maldonado MRN: [id number] DOB:1946/04/07, 78 y.o., female Today's Date: 05/25/2024  END OF SESSION:  PT End of Session - 05/25/24 1159     Visit Number 7    Number of Visits 8    Date for PT Re-Evaluation 05/25/24    PT Start Time 1100    PT Stop Time 1154    PT Time Calculation (min) 54 min    Activity Tolerance Patient tolerated treatment well    Behavior During Therapy WFL for tasks assessed/performed             Past Medical History:  Diagnosis Date   Anxiety    Arthritis    Cancer (HCC)    breast Right no chemo or radiation  on Arimidex    COPD (chronic obstructive pulmonary disease) (HCC)    on Breo   GERD (gastroesophageal reflux disease)    Humerus fracture 2007   Hyperlipidemia    Hypertension    Insomnia    Lung cancer (HCC)    OA (osteoarthritis) of knee 10/08/2020   Thyroid  cancer (HCC) 2023   Past Surgical History:  Procedure Laterality Date   BREAST LUMPECTOMY Right 02/2019   BREAST SURGERY Right 01/2019   breast biopsy   BRONCHIAL BIOPSY  04/15/2022   Procedure: BRONCHIAL BIOPSIES;  Surgeon: Brenna Adine CROME, DO;  Location: [facility];  Service: Pulmonary;;   BRONCHIAL BRUSHINGS  04/15/2022   Procedure: BRONCHIAL BRUSHINGS;  Surgeon: Brenna Adine CROME, DO;  Location: [facility];  Service: Pulmonary;;   BRONCHIAL NEEDLE ASPIRATION BIOPSY  04/15/2022   Procedure: BRONCHIAL NEEDLE ASPIRATION BIOPSIES;  Surgeon: Brenna Adine CROME, DO;  Location: [facility];  Service: Pulmonary;;   COLONOSCOPY  2022   ESOPHAGOGASTRODUODENOSCOPY (EGD) WITH PROPOFOL  N/A 01/02/2023   Procedure: ESOPHAGOGASTRODUODENOSCOPY (EGD) WITH PROPOFOL ;  Surgeon: Cindie Carlin POUR, DO;  [facility];  Service: Endoscopy;  Laterality: N/A;  12:45 pm, pt can't come earlier   FIDUCIAL MARKER PLACEMENT  04/15/2022   Procedure: FIDUCIAL MARKER PLACEMENT;  Surgeon: Brenna Adine CROME, DO;  Location: [facility];  Service: Pulmonary;;   JOINT REPLACEMENT     left knee   Left shoulder surgery     2007   RADICAL NECK DISSECTION Right 10/10/2022   Procedure: CENTRAL NECK DISSECTION;  Surgeon: Luciano Standing, MD;  Location: [facility] OR;  Service: ENT;  Laterality: Right;   THYROIDECTOMY Right 10/10/2022   Procedure: RIGHT THYROID  LOBECTOMY;  Surgeon: Luciano Standing, MD;  Location: [facility];  Service: ENT;  Laterality: Right;   TOTAL KNEE ARTHROPLASTY     2012 left   TOTAL KNEE ARTHROPLASTY Right 10/08/2020   Procedure: TOTAL KNEE ARTHROPLASTY;  Surgeon: Melodi Lerner, MD;  Location: [facility];  Service: Orthopedics;  Laterality: Right;   TUBAL LIGATION  1978   VIDEO BRONCHOSCOPY WITH RADIAL ENDOBRONCHIAL ULTRASOUND  04/15/2022   Procedure: VIDEO BRONCHOSCOPY WITH RADIAL ENDOBRONCHIAL ULTRASOUND;  Surgeon: Brenna Adine CROME, DO;  Location: [facility];  Service: Pulmonary;;   Patient Active Problem List   Diagnosis Date Noted   Bronchitis 12/31/2023   Cough in adult 12/31/2023   Wheezing 12/31/2023   Postoperative hypothyroidism 08/05/2023   Anal fissure 11/13/2022   Perianal abscess 11/12/2022   Diarrhea 11/05/2022   Gastroenteritis 11/05/2022   AKI (acute kidney injury) (HCC) 11/05/2022   Hypokalemia 11/01/2022   LPRD (laryngopharyngeal reflux disease) 09/28/2022   Malignant neoplasm of lower lobe of right lung (HCC) 08/11/2022   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) 04/23/2022   Multiple lung nodules 03/26/2022   Ground glass opacity present on imaging of lung 03/26/2022   Papillary thyroid  carcinoma (HCC) 03/17/2022   Positive self-administered antigen test for COVID-19 10/28/2021   Primary osteoarthritis of right knee 10/08/2020   Pain in right knee 07/25/2020   Malignant neoplasm of lower-inner quadrant of right breast of female, estrogen receptor positive (HCC) 02/23/2019   Age-related nuclear cataract, bilateral 03/01/2018   Vitamin D  deficiency 03/08/2015    COPD (chronic obstructive pulmonary disease) (HCC) 08/11/2014   Fall 12/19/2013   Torn rotator cuff 08/31/2013   Rotator cuff syndrome of left shoulder 07/11/2013   Hyperlipidemia 06/29/2013   Hypertension 06/29/2013   GAD (generalized anxiety disorder) 06/29/2013   Diverticulosis of colon without hemorrhage 06/29/2013   GERD (gastroesophageal reflux disease) 06/29/2013   Insomnia 06/29/2013   Osteopenia 06/29/2013   Epiretinal membrane 02/23/2013   Nuclear cataract 02/23/2013   Presence of unspecified artificial knee joint 10/06/2011   REFERRING PROVIDER: Butler Der MD  REFERRING DIAG: Left buttock pain.  Rationale for Evaluation and Treatment: Rehabilitation  THERAPY DIAG:  Other low back pain  Other muscle spasm  ONSET DATE:   SUBJECTIVE:                                                                                                                                                                                           SUBJECTIVE STATEMENT:  Doing much better.  Pain low. PERTINENT HISTORY:  Per patient:  OP, h/o cancer, bilateral TKA's.  PAIN:  Are you having pain? Yes: NPRS scale: Low/10. Pain location: Bilateral buttocks and hips. Pain description: As above. Aggravating factors: As above. Relieving factors: As above.    PRECAUTIONS: None  RED FLAGS: None   WEIGHT BEARING RESTRICTIONS: No  FALLS:  Has patient fallen in last 6 months? No  LIVING ENVIRONMENT: Lives with: lives with their spouse Lives in: House/apartment Has following equipment at home: None.  But she states she usually uses a cane.  OCCUPATION: Retired.  PLOF: Independent  PATIENT GOALS: Be able to do more such as walking and standing longer with less pain.      OBJECTIVE:   POSTURE: rounded shoulders, forward head, and flexed trunk   PALPATION: Tender to palpation over bilateral gluteals (max) and Glut med and TFL's   LOWER EXTREMITY ROM:     WNL for bilateral  LE's.  LOWER EXTREMITY MMT:    Bilateral hip flexion and abduction is 4/5, knees= 4+/5.  Normal bilateral ankle strength.   FUNCTIONAL TESTS:  5 times sit to stand: 14 seconds Timed up  and go (TUG): 13 seconds.  GAIT: The patient walks with a widened BOS  TREATMENT DATE:   05/25/24:  Nustep level 3 x 15 minutes f/b STW/M x 8 minutes to patient's left lumbar musculature with ischemic release technique utilized with patient seated and leaning forward with Ue's on pillow on treatment table f/b IFC at 80-150 Hz on 40% scan x 20 minutes.  Normal modality response following removal of modality.  05/23/24:  Nustep level 3 x 15 minutes with UE and LE activity Performed positional traction in right sdly position with small blue bolster x 15 minutes while receiving STW/M/ TPR to LT QL  and piriformis x 15 mins   IFC at 80-150 Hz on 40% scan x 15 minutes in RT side lying.  Normal modality response following removal of modality. Discussed Home TENS unit                                                                                                                  PATIENT EDUCATION:  Education details: See below. Person educated: Patient. Education method: Handout Education comprehension: Multimedia programmer.  HOME EXERCISE PROGRAM: SKTC  [[id number]]  SINGLE KNEE TO CHEST STRETCH - SKTC -  Repeat 3 Repetitions, Hold 1 Minute, Complete 1 Set, Perform 3 Times a Day  DOUBLE KNEE TO CHEST STRETCH - DKTC -  Repeat 2 Repetitions, Hold 1 Minute, Complete 1 Set, Perform 3 Times a Day  Bridges -  Repeat 15 Repetitions, Hold 2 Seconds, Complete 2 Sets, Perform 2 Times a Day  ASSESSMENT:  CLINICAL IMPRESSION: Patient very pleased and reports a low pain-level upon presentation to the clinic.  Focused on left lumbar musculature which exhibited increased tone.  No pain reported after treatment. On more visit planned.    OBJECTIVE IMPAIRMENTS: Abnormal gait, decreased activity tolerance, decreased strength,  increased muscle spasms, and pain.   ACTIVITY LIMITATIONS: standing and locomotion level  PARTICIPATION LIMITATIONS: meal prep, cleaning, laundry, shopping, community activity, and yard work  PERSONAL FACTORS: Time since onset of injury/illness/exacerbation and 1 comorbidity: cancer are also affecting patient's functional outcome.   REHAB POTENTIAL: Good  CLINICAL DECISION MAKING: Evolving/moderate complexity  EVALUATION COMPLEXITY: Moderate   GOALS:  SHORT TERM GOALS: Target date: 05/11/24  Ind with an initial HEP. Goal status: MET  LONG TERM GOALS: Target date: 05/25/24  Ind with an advanced HEP.  Goal status: On going  2.  Improve bilateral hip strength to 5/5 for improved stability for functional tasks.    Goal status: On going  3.  Improve TUG to 10 seconds.  Goal status: On going  4.  Improve 5 time sit to stand to 12 seconds Baseline:  Goal status: On going  5.  Stand 20 minutes with pain not > 3/10.  Goal status: On going  6.  Walk a community distance with pain not > 3/10.  Goal status: On going  PLAN:  PT FREQUENCY: 2x/week  PT DURATION: 4 weeks  PLANNED INTERVENTIONS: 97110-Therapeutic exercises, 97530- Therapeutic activity, [id number]- Neuromuscular  re-education, [phone]- Self Care, and 02859- Manual therapy.  PLAN FOR NEXT SESSION: Plan for one more visit.  Finalize/update HEP.  Goal Re-assessment.    Idalee Foxworthy, [place], PT 05/25/2024, 12:05 PM

## 2024-06-01 ENCOUNTER — Ambulatory Visit: Admitting: Physical Therapy

## 2024-06-01 DIAGNOSIS — M5459 Other low back pain: Secondary | ICD-10-CM | POA: Diagnosis not present

## 2024-06-01 DIAGNOSIS — M62838 Other muscle spasm: Secondary | ICD-10-CM | POA: Diagnosis not present

## 2024-06-01 NOTE — Therapy (Signed)
 OUTPATIENT PHYSICAL THERAPY THORACOLUMBAR TREATMENT  Patient Name: Renee Maldonado MRN: [id number] DOB:Jul 01, 1946, 78 y.o., female Today's Date: 06/01/2024  END OF SESSION:  PT End of Session - 06/01/24 0944     Visit Number 8    Number of Visits 8    Date for PT Re-Evaluation 05/25/24    PT Start Time 0930    PT Stop Time 1027    PT Time Calculation (min) 57 min    Behavior During Therapy [facility] for tasks assessed/performed             Past Medical History:  Diagnosis Date   Anxiety    Arthritis    Cancer (HCC)    breast Right no chemo or radiation  on Arimidex    COPD (chronic obstructive pulmonary disease) (HCC)    on Breo   GERD (gastroesophageal reflux disease)    Humerus fracture 2007   Hyperlipidemia    Hypertension    Insomnia    Lung cancer (HCC)    OA (osteoarthritis) of knee 10/08/2020   Thyroid  cancer (HCC) 2023   Past Surgical History:  Procedure Laterality Date   BREAST LUMPECTOMY Right 02/2019   BREAST SURGERY Right 01/2019   breast biopsy   BRONCHIAL BIOPSY  04/15/2022   Procedure: BRONCHIAL BIOPSIES;  Surgeon: Brenna Adine CROME, DO;  Location: [facility];  Service: Pulmonary;;   BRONCHIAL BRUSHINGS  04/15/2022   Procedure: BRONCHIAL BRUSHINGS;  Surgeon: Brenna Adine CROME, DO;  Location: [facility];  Service: Pulmonary;;   BRONCHIAL NEEDLE ASPIRATION BIOPSY  04/15/2022   Procedure: BRONCHIAL NEEDLE ASPIRATION BIOPSIES;  Surgeon: Brenna Adine CROME, DO;  Location: [facility];  Service: Pulmonary;;   COLONOSCOPY  2022   ESOPHAGOGASTRODUODENOSCOPY (EGD) WITH PROPOFOL  N/A 01/02/2023   Procedure: ESOPHAGOGASTRODUODENOSCOPY (EGD) WITH PROPOFOL ;  Surgeon: Cindie Carlin POUR, DO;  [facility]: [facility];  Service: Endoscopy;  Laterality: N/A;  12:45 pm, pt can't come earlier   FIDUCIAL MARKER PLACEMENT  04/15/2022   Procedure: FIDUCIAL MARKER PLACEMENT;  Surgeon: Brenna Adine CROME, DO;  Location: [facility];  Service: Pulmonary;;   JOINT REPLACEMENT      left knee   Left shoulder surgery     2007   RADICAL NECK DISSECTION Right 10/10/2022   Procedure: CENTRAL NECK DISSECTION;  Surgeon: Luciano Standing, MD;  Location: [facility] OR;  Service: ENT;  Laterality: Right;   THYROIDECTOMY Right 10/10/2022   Procedure: RIGHT THYROID  LOBECTOMY;  Surgeon: Luciano Standing, MD;  Location: [facility];  Service: ENT;  Laterality: Right;   TOTAL KNEE ARTHROPLASTY     2012 left   TOTAL KNEE ARTHROPLASTY Right 10/08/2020   Procedure: TOTAL KNEE ARTHROPLASTY;  Surgeon: Melodi Lerner, MD;  Location: [facility];  Service: Orthopedics;  Laterality: Right;   TUBAL LIGATION  1978   VIDEO BRONCHOSCOPY WITH RADIAL ENDOBRONCHIAL ULTRASOUND  04/15/2022   Procedure: VIDEO BRONCHOSCOPY WITH RADIAL ENDOBRONCHIAL ULTRASOUND;  Surgeon: Brenna Adine CROME, DO;  Location: [facility];  Service: Pulmonary;;   Patient Active Problem List   Diagnosis Date Noted   Bronchitis 12/31/2023   Cough in adult 12/31/2023   Wheezing 12/31/2023   Postoperative hypothyroidism 08/05/2023   Anal fissure 11/13/2022   Perianal abscess 11/12/2022   Diarrhea 11/05/2022   Gastroenteritis 11/05/2022   AKI (acute kidney injury) (HCC) 11/05/2022   Hypokalemia 11/01/2022   LPRD (laryngopharyngeal reflux disease) 09/28/2022   Malignant neoplasm of lower lobe of right lung (HCC) 08/11/2022   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) 04/23/2022  Multiple lung nodules 03/26/2022   Ground glass opacity present on imaging of lung 03/26/2022   Papillary thyroid  carcinoma (HCC) 03/17/2022   Positive self-administered antigen test for COVID-19 10/28/2021   Primary osteoarthritis of right knee 10/08/2020   Pain in right knee 07/25/2020   Malignant neoplasm of lower-inner quadrant of right breast of female, estrogen receptor positive (HCC) 02/23/2019   Age-related nuclear cataract, bilateral 03/01/2018   Vitamin D  deficiency 03/08/2015   COPD (chronic obstructive pulmonary disease) (HCC) 08/11/2014    Fall 12/19/2013   Torn rotator cuff 08/31/2013   Rotator cuff syndrome of left shoulder 07/11/2013   Hyperlipidemia 06/29/2013   Hypertension 06/29/2013   GAD (generalized anxiety disorder) 06/29/2013   Diverticulosis of colon without hemorrhage 06/29/2013   GERD (gastroesophageal reflux disease) 06/29/2013   Insomnia 06/29/2013   Osteopenia 06/29/2013   Epiretinal membrane 02/23/2013   Nuclear cataract 02/23/2013   Presence of unspecified artificial knee joint 10/06/2011   REFERRING PROVIDER: Butler Der MD  REFERRING DIAG: Left buttock pain.  Rationale for Evaluation and Treatment: Rehabilitation  THERAPY DIAG:  Other low back pain  Other muscle spasm  ONSET DATE:   SUBJECTIVE:                                                                                                                                                                                           SUBJECTIVE STATEMENT:  Doing much better.  Pain low.  90% better overall. PERTINENT HISTORY:  Per patient:  OP, h/o cancer, bilateral TKA's.  PAIN:  Are you having pain? Yes: NPRS scale: Low/10. Pain location: Bilateral buttocks and hips. Pain description: As above. Aggravating factors: As above. Relieving factors: As above.    PRECAUTIONS: None  RED FLAGS: None   WEIGHT BEARING RESTRICTIONS: No  FALLS:  Has patient fallen in last 6 months? No  LIVING ENVIRONMENT: Lives with: lives with their spouse Lives in: House/apartment Has following equipment at home: None.  But she states she usually uses a cane.  OCCUPATION: Retired.  PLOF: Independent  PATIENT GOALS: Be able to do more such as walking and standing longer with less pain.      OBJECTIVE:   POSTURE: rounded shoulders, forward head, and flexed trunk   PALPATION: Tender to palpation over bilateral gluteals (max) and Glut med and TFL's   LOWER EXTREMITY ROM:     WNL for bilateral LE's.  LOWER EXTREMITY MMT:    Bilateral hip  flexion and abduction is 4/5, knees= 4+/5.  Normal bilateral ankle strength.   FUNCTIONAL TESTS:  5 times sit to stand: 14 seconds Timed up and go (TUG): 13 seconds.  GAIT: The patient walks with a widened BOS  TREATMENT DATE:   06/01/24:  Nustep level 3 x 15 minutes f/b STW/M x 10 minutes to patient's left lumbar musculature with ischemic release technique utilized with patient seated and leaning forward with UE's on pillow on treatment table f/b IFC at 80-150 Hz on 40% scan x 20 minutes.  Normal modality response following removal of modality.   05/25/24:  Nustep level 3 x 15 minutes f/b STW/M x 8 minutes to patient's left lumbar musculature with ischemic release technique utilized with patient seated and leaning forward with Ue's on pillow on treatment table f/b IFC at 80-150 Hz on 40% scan x 20 minutes.  Normal modality response following removal of modality.  05/23/24:  Nustep level 3 x 15 minutes with UE and LE activity Performed positional traction in right sdly position with small blue bolster x 15 minutes while receiving STW/M/ TPR to LT QL  and piriformis x 15 mins   IFC at 80-150 Hz on 40% scan x 15 minutes in RT side lying.  Normal modality response following removal of modality. Discussed Home TENS unit                                                                                                                  PATIENT EDUCATION:  Education details: See below. Person educated: Patient. Education method: Handout Education comprehension: Multimedia programmer.  HOME EXERCISE PROGRAM: SKTC  [[id number]]  SINGLE KNEE TO CHEST STRETCH - SKTC -  Repeat 3 Repetitions, Hold 1 Minute, Complete 1 Set, Perform 3 Times a Day  DOUBLE KNEE TO CHEST STRETCH - DKTC -  Repeat 2 Repetitions, Hold 1 Minute, Complete 1 Set, Perform 3 Times a Day  Bridges -  Repeat 15 Repetitions, Hold 2 Seconds, Complete 2 Sets, Perform 2 Times a Day  ASSESSMENT:  CLINICAL IMPRESSION: Patient did very well and  met all goals.   OBJECTIVE IMPAIRMENTS: Abnormal gait, decreased activity tolerance, decreased strength, increased muscle spasms, and pain.   ACTIVITY LIMITATIONS: standing and locomotion level  PARTICIPATION LIMITATIONS: meal prep, cleaning, laundry, shopping, community activity, and yard work  PERSONAL FACTORS: Time since onset of injury/illness/exacerbation and 1 comorbidity: cancer are also affecting patient's functional outcome.   REHAB POTENTIAL: Good  CLINICAL DECISION MAKING: Evolving/moderate complexity  EVALUATION COMPLEXITY: Moderate   GOALS:  SHORT TERM GOALS: Target date: 05/11/24  Ind with an initial HEP. Goal status: MET  LONG TERM GOALS: Target date: 05/25/24  Ind with an advanced HEP.  Goal status: MET  2.  Improve bilateral hip strength to 5/5 for improved stability for functional tasks.    Goal status: MET  3.  Improve TUG to 10 seconds.  Goal status: 10 seconds  4.  Improve 5 time sit to stand to 12 seconds Baseline:  Goal status: MET:  10 seconds  5.  Stand 20 minutes with pain not > 3/10.  Goal status: MET.  6.  Walk a community distance with pain not > 3/10.  Goal  status: MET PLAN:  PT FREQUENCY: 2x/week  PT DURATION: 4 weeks  PLANNED INTERVENTIONS: 97110-Therapeutic exercises, 97530- Therapeutic activity, [id number]- Neuromuscular re-education, 97535- Self Care, and 02859- Manual therapy.  PLAN FOR NEXT SESSION: Plan for one more visit.  Finalize/update HEP.  Goal Re-assessment.    PHYSICAL THERAPY DISCHARGE SUMMARY  Visits from Start of Care: 8.  Current functional level related to goals / functional outcomes: See above   Remaining deficits: All goals met.   Education / Equipment: HEP.   Patient agrees to discharge. Patient goals were met. Patient is being discharged due to meeting the stated rehab goals.      Johnel Yielding, [place], PT 06/01/2024, 10:29 AM

## 2024-06-15 ENCOUNTER — Encounter: Payer: Self-pay | Admitting: Gastroenterology

## 2024-07-04 ENCOUNTER — Ambulatory Visit: Admitting: Family Medicine

## 2024-07-05 ENCOUNTER — Telehealth: Payer: Self-pay | Admitting: Family Medicine

## 2024-07-05 NOTE — Telephone Encounter (Signed)
 Left message for patient to call back to discuss meds before her appt with Dr Zollie tomorrow.

## 2024-07-06 ENCOUNTER — Telehealth: Payer: Self-pay | Admitting: Family Medicine

## 2024-07-06 ENCOUNTER — Ambulatory Visit (INDEPENDENT_AMBULATORY_CARE_PROVIDER_SITE_OTHER): Admitting: Family Medicine

## 2024-07-06 ENCOUNTER — Encounter: Payer: Self-pay | Admitting: Family Medicine

## 2024-07-06 VITALS — BP 136/77 | HR 67 | Temp 97.9°F | Ht 61.0 in | Wt 184.6 lb

## 2024-07-06 DIAGNOSIS — R300 Dysuria: Secondary | ICD-10-CM | POA: Diagnosis not present

## 2024-07-06 DIAGNOSIS — E538 Deficiency of other specified B group vitamins: Secondary | ICD-10-CM

## 2024-07-06 DIAGNOSIS — I10 Essential (primary) hypertension: Secondary | ICD-10-CM

## 2024-07-06 DIAGNOSIS — M791 Myalgia, unspecified site: Secondary | ICD-10-CM | POA: Diagnosis not present

## 2024-07-06 DIAGNOSIS — E782 Mixed hyperlipidemia: Secondary | ICD-10-CM | POA: Diagnosis not present

## 2024-07-06 DIAGNOSIS — E890 Postprocedural hypothyroidism: Secondary | ICD-10-CM | POA: Diagnosis not present

## 2024-07-06 LAB — LIPID PANEL

## 2024-07-06 LAB — URINALYSIS, COMPLETE
Bilirubin, UA: NEGATIVE
Glucose, UA: NEGATIVE
Ketones, UA: NEGATIVE
Nitrite, UA: NEGATIVE
RBC, UA: NEGATIVE
Specific Gravity, UA: 1.015 (ref 1.005–1.030)
Urobilinogen, Ur: 0.2 mg/dL (ref 0.2–1.0)
pH, UA: 6 (ref 5.0–7.5)

## 2024-07-06 LAB — MICROSCOPIC EXAMINATION: RBC, Urine: NONE SEEN /HPF (ref 0–2)

## 2024-07-06 NOTE — Telephone Encounter (Signed)
 Please mail copy of lab results to pt when they come back

## 2024-07-07 LAB — LIPID PANEL
Cholesterol, Total: 128 mg/dL (ref 100–199)
HDL: 45 mg/dL (ref >39)
LDL CALC COMMENT:: 2.8 ratio (ref 0.0–4.4)
LDL Chol Calc (NIH): 60 mg/dL (ref 0–99)
Triglycerides: 127 mg/dL (ref 0–149)
VLDL Cholesterol Cal: 23 mg/dL (ref 5–40)

## 2024-07-07 LAB — CMP14+EGFR
ALT: 14 IU/L (ref 0–32)
AST: 18 IU/L (ref 0–40)
Albumin: 4.7 g/dL (ref 3.8–4.8)
Alkaline Phosphatase: 79 IU/L (ref 44–121)
BUN/Creatinine Ratio: 18 (ref 12–28)
BUN: 25 mg/dL (ref 8–27)
Bilirubin Total: 0.6 mg/dL (ref 0.0–1.2)
CO2: 24 mmol/L (ref 20–29)
Calcium: 10.4 mg/dL — AB (ref 8.7–10.3)
Chloride: 98 mmol/L (ref 96–106)
Creatinine, Ser: 1.4 mg/dL — AB (ref 0.57–1.00)
Globulin, Total: 2.2 g/dL (ref 1.5–4.5)
Glucose: 99 mg/dL (ref 70–99)
Potassium: 4.5 mmol/L (ref 3.5–5.2)
Sodium: 139 mmol/L (ref 134–144)
Total Protein: 6.9 g/dL (ref 6.0–8.5)
eGFR: 39 mL/min/1.73 — AB (ref >59)

## 2024-07-07 LAB — TSH+FREE T4
Free T4: 1.93 ng/dL — AB (ref 0.82–1.77)
TSH: 0.817 u[IU]/mL (ref 0.450–4.500)

## 2024-07-07 LAB — CBC WITH DIFFERENTIAL/PLATELET
Basophils Absolute: 0 x10E3/uL (ref 0.0–0.2)
Basos: 0 %
EOS (ABSOLUTE): 0.2 x10E3/uL (ref 0.0–0.4)
Eos: 3 %
Hematocrit: 40.9 % (ref 34.0–46.6)
Hemoglobin: 12.6 g/dL (ref 11.1–15.9)
Immature Grans (Abs): 0 x10E3/uL (ref 0.0–0.1)
Immature Granulocytes: 0 %
Lymphocytes Absolute: 1.3 x10E3/uL (ref 0.7–3.1)
Lymphs: 20 %
MCH: 26.2 pg — ABNORMAL LOW (ref 26.6–33.0)
MCHC: 30.8 g/dL — ABNORMAL LOW (ref 31.5–35.7)
MCV: 85 fL (ref 79–97)
Monocytes Absolute: 0.5 x10E3/uL (ref 0.1–0.9)
Monocytes: 7 %
Neutrophils Absolute: 4.5 x10E3/uL (ref 1.4–7.0)
Neutrophils: 70 %
Platelets: 285 x10E3/uL (ref 150–450)
RBC: 4.81 x10E6/uL (ref 3.77–5.28)
RDW: 13.2 % (ref 11.7–15.4)
WBC: 6.4 x10E3/uL (ref 3.4–10.8)

## 2024-07-07 LAB — VITAMIN B12: Vitamin B-12: 551 pg/mL (ref 232–1245)

## 2024-07-07 LAB — MAGNESIUM: Magnesium: 2.2 mg/dL (ref 1.6–2.3)

## 2024-07-08 LAB — URINE CULTURE: Organism ID, Bacteria: NO GROWTH

## 2024-07-10 ENCOUNTER — Encounter: Payer: Self-pay | Admitting: Family Medicine

## 2024-07-10 NOTE — Progress Notes (Signed)
 Subjective:  Patient ID: Renee Maldonado, female    DOB: February 26, 1946  Age: 78 y.o. MRN: [id number]  CC: Medical Management of Chronic Issues   HPI  Discussed the use of AI scribe software for clinical note transcription with the patient, who gave verbal consent to proceed.  History of Present Illness   Renee Maldonado is a 78 year old female who presents for a six-month follow-up and lab work review.  She is concerned about her B12 levels after stopping B12 shots a few months ago. She denies symptoms of B12 deficiency such as numbness or tingling in the tongue, fingers, or feet, and reports steady but not great energy levels. She awaits lab results to decide on resuming B12 shots.  She requests a kidney and bladder check due to her age, preferring a urine specimen. She experienced increased urination frequency a few days ago but reports improvement after drinking cranberry juice.  She is currently taking magnesium  and potassium supplements. She reports taking hydrochlorothiazide , which can cause potassium loss. She is unsure if she should continue these supplements and awaits lab results to decide.  Regarding her thyroid , she reports steady energy levels but often feels cold, especially in her fingertips. No hair loss, constipation, or numbness and tingling in her tongue, fingers, or feet. Her current medications include hydrochlorothiazide  and hydroxyzine .              07/06/2024    1:00 PM 07/06/2024   12:52 PM 03/31/2024   10:15 AM  Depression screen PHQ 2/9  Decreased Interest 0 0 0  Down, Depressed, Hopeless 0 0 0  PHQ - 2 Score 0 0 0  Altered sleeping 1  3  Tired, decreased energy 0  1  Change in appetite 0  0  Feeling bad or failure about yourself  0  0  Trouble concentrating 0  0  Moving slowly or fidgety/restless 0  2  Suicidal thoughts 0  0  PHQ-9 Score 1  6  Difficult doing work/chores Not difficult at all  Somewhat difficult    History Renee Maldonado has a past medical  history of Anxiety, Arthritis, Cancer (HCC), COPD (chronic obstructive pulmonary disease) (HCC), GERD (gastroesophageal reflux disease), Humerus fracture (2007), Hyperlipidemia, Hypertension, Insomnia, Lung cancer (HCC), OA (osteoarthritis) of knee (10/08/2020), and Thyroid  cancer (HCC) (2023).   She has a past surgical history that includes Total knee arthroplasty; Left shoulder surgery; Joint replacement; Tubal ligation (1978); Breast surgery (Right, 01/2019); Breast lumpectomy (Right, 02/2019); Total knee arthroplasty (Right, 10/08/2020); Bronchial biopsy (04/15/2022); Bronchial needle aspiration biopsy (04/15/2022); Bronchial brushings (04/15/2022); Video bronchoscopy with radial endobronchial ultrasound (04/15/2022); Fiducial marker placement (04/15/2022); Colonoscopy (2022); Thyroidectomy (Right, 10/10/2022); Radical neck dissection (Right, 10/10/2022); and Esophagogastroduodenoscopy (egd) with propofol  (N/A, 01/02/2023).   Her family history includes Alzheimer's disease in her mother; Cancer in her brother; Diabetes in her mother; Hypertension in her father; Stroke in her father.She reports that she quit smoking about 2 years ago. Her smoking use included cigarettes. She started smoking about 42 years ago. She has a 10 pack-year smoking history. She has never used smokeless tobacco. She reports current alcohol  use of about 4.0 standard drinks of alcohol  per week. She reports that she does not use drugs.    ROS Review of Systems  Constitutional: Negative.   HENT: Negative.    Eyes:  Negative for visual disturbance.  Respiratory:  Negative for shortness of breath.   Cardiovascular:  Negative for chest pain.  Gastrointestinal:  Negative for abdominal pain.  Musculoskeletal:  Negative for arthralgias.    Objective:  BP 136/77   Pulse 67   Temp 97.9 F (36.6 C)   Ht 5' 1 (1.549 m)   Wt 184 lb 9.6 oz (83.7 kg)   SpO2 94%   BMI 34.88 kg/m   BP Readings from Last 3 Encounters:  07/06/24  136/77  03/31/24 (!) 147/69  03/24/24 (!) 145/76    Wt Readings from Last 3 Encounters:  07/06/24 184 lb 9.6 oz (83.7 kg)  03/24/24 181 lb 2 oz (82.2 kg)  01/05/24 180 lb 3.2 oz (81.7 kg)     Physical Exam Constitutional:      General: She is not in acute distress.    Appearance: She is well-developed.  Cardiovascular:     Rate and Rhythm: Normal rate and regular rhythm.  Pulmonary:     Breath sounds: Normal breath sounds.  Musculoskeletal:        General: Normal range of motion.  Skin:    General: Skin is warm and dry.  Neurological:     Mental Status: She is alert and oriented to person, place, and time.     Physical Exam   HEENT: No cerumen impaction. NECK: No thyromegaly, no nodules. CHEST: Lungs clear to auscultation bilaterally. CARDIOVASCULAR: Heart sounds normal, regular rate and rhythm.      Assessment & Plan:  Dysuria -     Urinalysis, Complete -     Urine Culture -     Microscopic Examination  B12 deficiency -     Vitamin B12  Primary hypertension -     CMP14+EGFR -     CBC with Differential/Platelet  Mixed hyperlipidemia -     Lipid panel  Postoperative hypothyroidism -     TSH + free T4  Myalgia -     Magnesium     Assessment and Plan    Hypothyroidism Hypothyroidism with well-managed energy levels, reports feeling cold possibly due to thyroid  function. - Order thyroid  function tests to assess current thyroid  hormone levels.  Vitamin B12 deficiency on supplementation Vitamin B12 deficiency previously managed with injections, currently discontinued pending B12 level check. - Order B12 level test to determine if supplementation needs to be resumed.  Hypokalemia on supplementation Hypokalemia managed with potassium supplementation, on hydrochlorothiazide  which can cause potassium loss. - Order potassium level test to assess current potassium status and adjust supplementation as needed.  Magnesium  supplementation for muscle  cramps Magnesium  supplementation advised for muscle relaxation and cramps, continued based on symptoms. - Continue magnesium  supplementation if experiencing muscle cramps or spasms.           Follow-up: Return in about 6 months (around 01/06/2025).  Butler Der, M.D.

## 2024-07-11 ENCOUNTER — Ambulatory Visit: Payer: Self-pay | Admitting: Family Medicine

## 2024-08-12 NOTE — Progress Notes (Signed)
 GI Office Note    Referring Provider: Zollie Lowers, MD Primary Care Physician:  Zollie Lowers, MD Primary Gastroenterologist: Carlin POUR. Cindie, DO  Date:  08/15/2024  ID:  Renee Maldonado, DOB 1946-08-11, MRN [id number]   Chief Complaint   Chief Complaint  Patient presents with   Follow-up    Follow up. No problems    History of Present Illness  Renee Maldonado is a 78 y.o. 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  Colonoscopy April 2022 at [facility]: Unable to review report. (Patient has previously stated she would not have another colonoscopy)   Admitted to hospital December 2023 with diarrhea x 1 week.  CT with findings consistent with ileus/enteritis, cirrhosis with mild steatosis, and 1 cm right lower lobe groundglass nodule, small hiatal hernia, and small inguinal fat hernia.  Stools positive for adenovirus and she was treated supportively with Imodium  and cholestyramine .  Developed rectal pain during admission with concern for perianal abscess.  General surgery was consulted and found to have perianal ulceration at 6 o'clock position with recommendations for supportive treatment with Anusol  suppositories and hemorrhoid cream compounded with nitroglycerin on discharge.   Seen by general surgery at the end of January and reported improvement in rectal pain with compounded hemorrhoid cream.  Per notes her exam noted that her ulceration had almost completely healed.   Labs in February negative for hepatitis A, B, or C.  No immunity to hepatitis A or B.  AFP within normal limits.  Negative ANA, ceruloplasmin, ASMA, AMA, ANA.  Ferritin mildly elevated at 183.  No elevation in immunoglobulins  Last office visit 01/05/24.  Denied any hematemesis or coffee-ground emesis.  Reports some mild peripheral edema but no  overt ascites or abdominal distention.  On HCTZ.  Taking antibiotics and cough syrup given some bronchitis..  Have been trying to refrain from alcohol  is much as possible.  Reinforced cirrhosis diet and lifestyle modifications.  Advised to avoid NSAIDs and alcohol  completely and continue her HCTZ.  Given lactulose  10 g once daily given variation of stool consistency and concern for constipation.  RUQ US  with elastography ordered as well as MELD labs. MELD 12 from Aug 2024 labs.   Labs in August with MELD 11.  AFP 2.6.  She advised that she has stopped taking lactulose  given she was having 3 bowel movements in the morning time.  US  with elastography 01/15/24: Normal US  of liver and gallbladder.  CBD 4.6mm kPa 2.9 IQR/median kPa ration: 0.59 (reduced accuracy)  Today:  US : feb 2025 - DUE AFP: 2.6 ( Feb 2025) Hep A/B vaccination: EGD:  Feb 2024 BB: none Ascites/peripheral edema: No ascites. Has some mild peripheral edema and has mild indention with socks.  Diuretics: HCTZ Paracentesis: none History of SBP: none Encephalopathy:  none  MELD 3.0: 11 at 01/05/2024  4:12 PM MELD-Na: 10 at 01/05/2024  4:12 PM Calculated from: Serum Creatinine: 1.42 mg/dL at 7/81/7974  5:87 PM Serum Sodium: 140 mmol/L (Using max of 137 mmol/L) at 01/05/2024  4:12 PM Total Bilirubin: 0.3 mg/dL (Using min of 1 mg/dL) at 7/81/7974  5:87 PM Serum Albumin: 4.2 g/dL (Using max of 3.5 g/dL) at 7/81/7974  5:87 PM INR(ratio): 0.9 (Using min of 1) at 01/05/2024  4:12 PM Age at listing (hypothetical): 5 years Sex: Female at 01/05/2024  4:12 PM   Discussed the  use of AI scribe software for clinical note transcription with the patient, who gave verbal consent to proceed.  She is concerned about her kidney function, specifically her creatinine levels, which have been stable but elevated, ranging from 1.4 to 1.58 over the past year. Her creatinine was 1.42 in February, which is above the normal range of 1.0, but consistent  with her baseline due to chronic kidney disease. She inquires about dietary impacts on her kidney function, acknowledging her consumption of 'white foods' like potatoes and bread.  Regarding her liver function, her liver enzymes, including AST, ALT, and alkaline phosphatase, were within normal limits in both February and August. She expresses relief that her liver is 'looking good' compared to her kidneys.  She reports swelling in her legs, feet, and ankles, which she attributes to the elastic in her socks and notes that it is not as severe as when she was hospitalized. She tries to manage it by keeping her feet elevated. She is also on HCTZ. No significant increase in abdominal girth or fluid retention in the abdomen. Occasional shortness of breath but no significant fluid retention around the heart or lungs that she is aware of.   She is experiencing vision issues due to a wrinkled retina in her right eye and cataracts in both eyes. She has scheduled cataract surgery for November 17th for her right eye and a subsequent surgery for her left eye in December. She is anxious about the procedures but hopeful for improved vision.  Her bowel movements are described as 'number twos and threes,' which are lumpy and formed, but she experiences frequent bowel movements without complete evacuation. She previously tried lactulose  but found it ineffective and discontinued it. She notes that certain foods like salad and shrimp affect her bowel habits.  Her diet includes fresh fruits, fudge sickles, and [employer]ese[employer] peanut butter cups. She acknowledges gaining weight back after a previous hospital stay, currently weighing 190 pounds. She enjoys participating in day trips and activities with local senior centers.      Wt Readings from Last 5 Encounters:  08/15/24 190 lb (86.2 kg)  07/06/24 184 lb 9.6 oz (83.7 kg)  03/24/24 181 lb 2 oz (82.2 kg)  01/05/24 180 lb 3.2 oz (81.7 kg)  12/31/23 180 lb (81.6 kg)    Current  Outpatient Medications  Medication Sig Dispense Refill   albuterol  (VENTOLIN  HFA) 108 (90 Base) MCG/ACT inhaler 2 puffs in lungs every 6 hours as needed for wheezing/short of breath. 8.5 g 5   alendronate  (FOSAMAX ) 70 MG tablet Take 1 tablet (70 mg total) by mouth every 7 (seven) days. Take with a full glass of water  on an empty stomach. Do not lay down for at least 2 hours 13 tablet 3   amLODipine  (NORVASC ) 5 MG tablet TAKE ONE TABLET ONCE DAILY 90 tablet 3   Calcium  Carb-Cholecalciferol 600-20 MG-MCG TABS Take 1 tablet by mouth in the morning.     cholecalciferol (VITAMIN D3) 25 MCG (1000 UNIT) tablet Take 1,000 Units by mouth in the morning.     famotidine  (PEPCID ) 40 MG tablet Take 1 tablet (40 mg total) by mouth daily. 90 tablet 0   hydrochlorothiazide  (HYDRODIURIL ) 25 MG tablet Take 1 tablet (25 mg total) by mouth daily. 90 tablet 1   L-Lysine  500 MG TABS Take 500 mg by mouth at bedtime.     levothyroxine  (SYNTHROID ) 75 MCG tablet Take 1 tablet (75 mcg total) by mouth daily before breakfast. 90 tablet 1  magnesium  oxide (MAG-OX) 400 (240 Mg) MG tablet Take 1 tablet (400 mg total) by mouth daily. 90 tablet 3   potassium chloride  (KLOR-CON  10) 10 MEQ tablet Take 1 tablet (10 mEq total) by mouth daily. 180 tablet 3   traZODone  (DESYREL ) 100 MG tablet Take 1 tablet (100 mg total) by mouth at bedtime. 90 tablet 3   valACYclovir  (VALTREX ) 1000 MG tablet TAKE TWO TABLETS TWICE DAILY FOR ONE DAY FOR COLD SORES 20 tablet 5   chlorpheniramine  (CHLOR-TRIMETON ) 4 MG tablet Take 4 mg by mouth in the morning and at bedtime. (Patient not taking: Reported on 07/06/2024)     CONSTULOSE  10 GM/15ML solution TAKE BY MOUTH EVERY DAY 236 mL 0   cyanocobalamin  (VITAMIN B12) 1000 MCG/ML injection Inject 1 mL (1,000 mcg total) into the skin every 30 (thirty) days. (Patient not taking: Reported on 07/06/2024) 1 mL 11   fluticasone  furoate-vilanterol (BREO ELLIPTA ) 200-25 MCG/ACT AEPB Inhale 1 puff into the lungs  daily. (Patient not taking: Reported on 07/06/2024) 1 each 11   guaifenesin  (HUMIBID E) 400 MG TABS tablet Take 1 tablet (400 mg total) by mouth every 6 (six) hours as needed. 30 tablet 0   hydrOXYzine  (ATARAX /VISTARIL ) 25 MG tablet Take 25 mg by mouth every 4 (four) hours as needed for itching.     Melatonin 10 MG CAPS Take 10 mg by mouth at bedtime.     nystatin powder Apply 1 Application topically 3 (three) times daily.     Polyethyl Glycol-Propyl Glycol (LUBRICANT EYE DROPS) 0.4-0.3 % SOLN Place 1-2 drops into both eyes 3 (three) times daily as needed (dry/irritated eyes.). (Patient not taking: Reported on 07/06/2024)     RESTASIS  0.05 % ophthalmic emulsion Place 1 drop into both eyes 2 (two) times daily as needed (dry eyes).  (Patient not taking: Reported on 08/15/2024)     rosuvastatin  (CRESTOR ) 20 MG tablet TAKE ONE TABLET DAILY 90 tablet 3   sodium chloride  (OCEAN) 0.65 % SOLN nasal spray Place 1 spray into both nostrils as needed for congestion. (Patient not taking: Reported on 07/06/2024)     No current facility-administered medications for this visit.    Past Medical History:  Diagnosis Date   Anxiety    Arthritis    Cancer (HCC)    breast Right no chemo or radiation  on Arimidex    COPD (chronic obstructive pulmonary disease) (HCC)    on Breo   GERD (gastroesophageal reflux disease)    Humerus fracture 2007   Hyperlipidemia    Hypertension    Insomnia    Lung cancer (HCC)    OA (osteoarthritis) of knee 10/08/2020   Thyroid  cancer (HCC) 2023    Past Surgical History:  Procedure Laterality Date   BREAST LUMPECTOMY Right 02/2019   BREAST SURGERY Right 01/2019   breast biopsy   BRONCHIAL BIOPSY  04/15/2022   Procedure: BRONCHIAL BIOPSIES;  Surgeon: Brenna Adine CROME, DO;  Location: [facility];  Service: Pulmonary;;   BRONCHIAL BRUSHINGS  04/15/2022   Procedure: BRONCHIAL BRUSHINGS;  Surgeon: Brenna Adine CROME, DO;  Location: [facility];  Service: Pulmonary;;   BRONCHIAL  NEEDLE ASPIRATION BIOPSY  04/15/2022   Procedure: BRONCHIAL NEEDLE ASPIRATION BIOPSIES;  Surgeon: Brenna Adine CROME, DO;  Location: [facility];  Service: Pulmonary;;   COLONOSCOPY  2022   ESOPHAGOGASTRODUODENOSCOPY (EGD) WITH PROPOFOL  N/A 01/02/2023   Procedure: ESOPHAGOGASTRODUODENOSCOPY (EGD) WITH PROPOFOL ;  Surgeon: Cindie Carlin POUR, DO;  Location: [facility];  Service: Endoscopy;  Laterality: N/A;  12:45 pm, pt can't come earlier   FIDUCIAL MARKER PLACEMENT  04/15/2022   Procedure: FIDUCIAL MARKER PLACEMENT;  Surgeon: Brenna Adine CROME, DO;  Location: [facility];  Service: Pulmonary;;   JOINT REPLACEMENT     left knee   Left shoulder surgery     2007   RADICAL NECK DISSECTION Right 10/10/2022   Procedure: CENTRAL NECK DISSECTION;  Surgeon: Luciano Standing, MD;  Location: [facility] OR;  Service: ENT;  Laterality: Right;   THYROIDECTOMY Right 10/10/2022   Procedure: RIGHT THYROID  LOBECTOMY;  Surgeon: Luciano Standing, MD;  Location: [facility] OR;  Service: ENT;  Laterality: Right;   TOTAL KNEE ARTHROPLASTY     2012 left   TOTAL KNEE ARTHROPLASTY Right 10/08/2020   Procedure: TOTAL KNEE ARTHROPLASTY;  Surgeon: Melodi Lerner, MD;  Location: [facility];  Service: Orthopedics;  Laterality: Right;   TUBAL LIGATION  1978   VIDEO BRONCHOSCOPY WITH RADIAL ENDOBRONCHIAL ULTRASOUND  04/15/2022   Procedure: VIDEO BRONCHOSCOPY WITH RADIAL ENDOBRONCHIAL ULTRASOUND;  Surgeon: Brenna Adine CROME, DO;  Location: [facility];  Service: Pulmonary;;    Family History  Problem Relation Age of Onset   Alzheimer's disease Mother    Diabetes Mother    Stroke Father    Hypertension Father    Cancer Brother    Liver disease Neg Hx     Allergies as of 08/15/2024 - Review Complete 08/15/2024  Allergen Reaction Noted   Benadryl  [diphenhydramine  hcl] Nausea Only 06/29/2013   Penicillins  06/29/2013    Social History   Socioeconomic History   Marital status: Married    Spouse name: Tanda   Number of children: 3    Years of education: 16   Highest education level: Bachelor's degree (e.g., BA, AB, BS)  Occupational History   Occupation: retired  Tobacco Use   Smoking status: Former    Current packs/day: 0.00    Average packs/day: 0.3 packs/day for 40.0 years (10.0 ttl pk-yrs)    Types: Cigarettes    Start date: 06/17/1982    Quit date: 06/17/2022    Years since quitting: 2.1   Smokeless tobacco: Never   Tobacco comments:    smokes 3 a day  Vaping Use   Vaping status: Former   Start date: 07/18/2018   Quit date: 07/19/2019  Substance and Sexual Activity   Alcohol  use: Yes    Alcohol /week: 4.0 standard drinks of alcohol     Types: 4 Standard drinks or equivalent per week    Comment: occasional, up to 4oz of vodka or whiskey per day for a week, then nothing for months.   Drug use: Never   Sexual activity: Not Currently  Other Topics Concern   Not on file  Social History Narrative   Lives home with her husband. Enjoys travelling, playing bingo, exercising at Recreation center, eats out a lot   Social Drivers of Health   Financial Resource Strain: Low Risk  (08/07/2023)   Overall Financial Resource Strain (CARDIA)    Difficulty of Paying Living Expenses: Not hard at all  Food Insecurity: No Food Insecurity (03/24/2024)   Hunger Vital Sign    Worried About Running Out of Food in the Last Year: Never true    Ran Out of Food in the Last Year: Never true  Transportation Needs: No Transportation Needs (03/24/2024)   PRAPARE - Administrator, Civil Service (Medical): No    Lack of Transportation (Non-Medical): No  Physical Activity: Insufficiently Active (08/07/2023)   Exercise Vital Sign  Days of Exercise per Week: 3 days    Minutes of Exercise per Session: 30 min  Stress: No Stress Concern Present (08/07/2023)   [employer] of Occupational Health - Occupational Stress Questionnaire    Feeling of Stress : Not at all  Social Connections: Socially Integrated (08/07/2023)   Social  Connection and Isolation Panel    Frequency of Communication with Friends and Family: More than three times a week    Frequency of Social Gatherings with Friends and Family: More than three times a week    Attends Religious Services: More than 4 times per year    Active Member of [employer] or Organizations: Yes    Attends Engineer, structural: More than 4 times per year    Marital Status: Married     Review of Systems   Gen: + weight gain. Denies fever, chills, anorexia. Denies fatigue, weakness, weight loss.  CV: + peripheral edema. Denies chest pain, palpitations, syncope,and claudication. Resp: Denies dyspnea at rest, cough, wheezing, coughing up blood, and pleurisy. GI: See HPI Derm: Denies rash, itching, dry skin Psych: Denies depression, anxiety, memory loss, confusion. No homicidal or suicidal ideation.  Heme: Denies bruising, bleeding, and enlarged lymph nodes. + blurry vision.   Physical Exam   BP 131/71 (BP Location: Right Wrist, Patient Position: Sitting, Cuff Size: Normal)   Pulse 69   Temp 97.6 F (36.4 C) (Temporal)   Ht 5' 1 (1.549 m)   Wt 190 lb (86.2 kg)   BMI 35.90 kg/m   General:   Alert and oriented. No distress noted. Pleasant and cooperative.  Head:  Normocephalic and atraumatic. Eyes:  Conjuctiva clear without scleral icterus. Glasses present.  Lungs:  Clear to auscultation bilaterally. No wheezes, rales, or rhonchi. No distress.  Heart:  S1, S2 present without murmurs appreciated.  Abdomen:  +BS, soft, non-tender and non-distended. No rebound or guarding. No HSM or masses noted. Exam limited as patient sitting and not able to get on exam table.  Rectal: deferred Msk: Normal posture. Weak gait - using single point cane.  Extremities:  Without edema. Neurologic:  Alert and  oriented x4 Psych:  Alert and cooperative. Normal mood and affect.  Assessment & Plan  Kiri Hinderliter is a 78 y.o. female presenting today for follow up on cirrhosis and  constipation.   Cirrhosis of liver Likely MASLD, possible EtOH contributing. Liver function tests from February and August show normal AST, ALT, and alkaline phosphatase levels, indicating well-managed liver function. MELD 10 in Feb 2025. No signs of esophageal varices or ascites on prior imaging. Discussed dietary habits and her potential impact on liver disease progression, emphasizing that while liver disease can progress regardless of diet, dietary choices can influence overall health. Maintained on HCTZ for BP and edema. No HE, jaundice, ascites. Due for hepatoma screening - last done in Feb 2025. - Print lab results with normal ranges and label liver-related tests per her request.  - Discuss dietary habits and potential impact on liver health. - RUQ US  for hepatoma screening.  - Labs in Feb 2026 - CBC, CMP, INR, AFP  Constipation Reports bowel movements are lumpy and not fully emptying. Lactulose  was previously tried but resulted in diarrhea. Discussed dietary influences on bowel habits, including fruit and salad intake. - Recommend trying Senokot/Senna at night as a gentle stool softener laxative.  Chronic kidney disease Chronic kidney disease with creatinine levels consistently around 1.4 to 1.5, which is above the normal range but well-managed for her  baseline. Discussed that chronic kidney disease does not typically improve back to normal and the goal is to maintain stability. Discussed dietary habits and her potential impact on kidney health, emphasizing the importance of maintaining current levels to prevent worsening. - Print lab results with normal ranges and label kidney-related tests. - Discuss dietary habits and potential impact on kidney health.     Follow up   Follow up Feb 2026    Charmaine Melia, MSN, FNP-BC, AGACNP-BC [facility]

## 2024-08-15 ENCOUNTER — Ambulatory Visit: Admitting: Gastroenterology

## 2024-08-15 ENCOUNTER — Encounter: Payer: Self-pay | Admitting: Gastroenterology

## 2024-08-15 VITALS — BP 131/71 | HR 69 | Temp 97.6°F | Ht 61.0 in | Wt 190.0 lb

## 2024-08-15 DIAGNOSIS — N189 Chronic kidney disease, unspecified: Secondary | ICD-10-CM

## 2024-08-15 DIAGNOSIS — K5900 Constipation, unspecified: Secondary | ICD-10-CM | POA: Diagnosis not present

## 2024-08-15 DIAGNOSIS — K7460 Unspecified cirrhosis of liver: Secondary | ICD-10-CM | POA: Diagnosis not present

## 2024-08-15 NOTE — Patient Instructions (Addendum)
 We will get you scheduled for your ultrasound.  You will be due for labs in February of next year.  Cirrhosis Lifestyle Recommendations:  High-protein diet from a primarily plant-based diet. Avoid red meat.  No raw or undercooked meat, seafood, or shellfish. Low-fat/cholesterol/carbohydrate diet. Limit sodium to no more than 2000 mg/day including everything that you eat and drink. Recommend at least 30 minutes of aerobic and resistance exercise 3 days/week. Limit Tylenol  to 2000 mg daily.   For constipation: Try over the counter senna (Senokot) 1 or 2 tablets daily to help with constipation.   Follow up in February 2026.  It was a pleasure to see you today. I want to create trusting relationships with patients. If you receive a survey regarding your visit,  I greatly appreciate you taking time to fill this out on paper or through your MyChart. I value your feedback.  Charmaine Melia, MSN, FNP-BC, AGACNP-BC [facility]

## 2024-08-18 ENCOUNTER — Ambulatory Visit (HOSPITAL_COMMUNITY)

## 2024-08-22 DIAGNOSIS — H353131 Nonexudative age-related macular degeneration, bilateral, early dry stage: Secondary | ICD-10-CM | POA: Diagnosis not present

## 2024-08-22 DIAGNOSIS — H2513 Age-related nuclear cataract, bilateral: Secondary | ICD-10-CM | POA: Diagnosis not present

## 2024-08-22 DIAGNOSIS — H35371 Puckering of macula, right eye: Secondary | ICD-10-CM | POA: Diagnosis not present

## 2024-08-25 ENCOUNTER — Ambulatory Visit (HOSPITAL_COMMUNITY)
Admission: RE | Admit: 2024-08-25 | Discharge: 2024-08-25 | Disposition: A | Discharge status: Home / Self Care | Source: Ambulatory Visit | Attending: Gastroenterology | Admitting: Gastroenterology

## 2024-08-25 DIAGNOSIS — K7460 Unspecified cirrhosis of liver: Secondary | ICD-10-CM | POA: Insufficient documentation

## 2024-08-27 ENCOUNTER — Other Ambulatory Visit: Payer: Self-pay | Admitting: Family Medicine

## 2024-08-31 ENCOUNTER — Ambulatory Visit: Payer: Self-pay | Admitting: Gastroenterology

## 2024-09-19 ENCOUNTER — Other Ambulatory Visit: Payer: Self-pay | Admitting: *Deleted

## 2024-09-19 ENCOUNTER — Telehealth: Payer: Self-pay | Admitting: *Deleted

## 2024-09-19 DIAGNOSIS — K219 Gastro-esophageal reflux disease without esophagitis: Secondary | ICD-10-CM

## 2024-09-19 NOTE — Telephone Encounter (Signed)
 CALLED PATIENT TO INFORM OF CT FOR 09-27-24- ARRIVAL TIME- 12:15 PM @ [facility], NO RESTRICTIONS TO SCAN, PATIENT TO RECEIVE RESULTS FROM PA ASHLYN BRUNING ON 10-05-24 @ 11:30 AM VIA TELEPHONE, LVM FOR A RETURN CALL

## 2024-09-21 ENCOUNTER — Telehealth: Payer: Self-pay | Admitting: Urology

## 2024-09-21 NOTE — Telephone Encounter (Signed)
 Pt called aking to r/s f/u appt for 11/19. Pt was ok with r/s to 11/20 @ 8:30am. Appt moved and team notified via inbasket.

## 2024-09-26 ENCOUNTER — Telehealth: Payer: Self-pay | Admitting: *Deleted

## 2024-09-26 NOTE — Telephone Encounter (Signed)
 Returned patient's phone call, no answering machine, will call later

## 2024-09-26 NOTE — Telephone Encounter (Signed)
 Called patient to inform of CT for 10-20-24- arrival time- 1:15 pm @ [facility], no restrictions to scan, patient to be called with results on 10-26-24 @ 10 am by PA, Ashlyn Bruning, with results,  spoke with patient and she is  aware of these appts. and the instructions

## 2024-09-27 ENCOUNTER — Ambulatory Visit (HOSPITAL_COMMUNITY)

## 2024-09-27 ENCOUNTER — Telehealth: Payer: Self-pay | Admitting: *Deleted

## 2024-09-27 NOTE — Telephone Encounter (Signed)
 CALLED PATIENT TO INFORM THAT ASHLYN BRUNING WILL CALL HER ON 10-27-24 @ 10 AM WITH SCAN RESULTS, SPOKE WITH PATIENT AND SHE IS AWARE OF THIS

## 2024-09-30 ENCOUNTER — Telehealth: Payer: Self-pay | Admitting: Family Medicine

## 2024-09-30 NOTE — Telephone Encounter (Unsigned)
 Copied from CRM #[id number]. Topic: Referral - Request for Referral >> Sep 30, 2024  3:19 PM Larissa S wrote: Did the patient discuss referral with their provider in the last year? Yes (If No - schedule appointment) (If Yes - send message)  Appointment offered? Yes  Type of order/referral and detailed reason for visit: Urology   Preference of office, provider, location: [facility]- Dr. Sherrilee  If referral order, have you been seen by this specialty before? No (If Yes, this issue or another issue? When? Where?  Can we respond through MyChart? No

## 2024-10-02 ENCOUNTER — Other Ambulatory Visit: Payer: Self-pay | Admitting: Family Medicine

## 2024-10-02 DIAGNOSIS — R300 Dysuria: Secondary | ICD-10-CM

## 2024-10-02 NOTE — Telephone Encounter (Signed)
Referral placed, as requested WS 

## 2024-10-03 DIAGNOSIS — H40013 Open angle with borderline findings, low risk, bilateral: Secondary | ICD-10-CM | POA: Diagnosis not present

## 2024-10-03 DIAGNOSIS — H04123 Dry eye syndrome of bilateral lacrimal glands: Secondary | ICD-10-CM | POA: Diagnosis not present

## 2024-10-03 NOTE — Telephone Encounter (Signed)
 Referral sent to: [facility] - [facility] [address] - [place] [address] [phone]  [place] Patient requested.

## 2024-10-03 NOTE — Telephone Encounter (Signed)
 Informed via vm. LS

## 2024-10-05 ENCOUNTER — Ambulatory Visit: Admitting: Urology

## 2024-10-06 ENCOUNTER — Encounter: Payer: Self-pay | Admitting: Family Medicine

## 2024-10-06 ENCOUNTER — Ambulatory Visit: Admitting: Urology

## 2024-10-06 ENCOUNTER — Ambulatory Visit: Payer: Self-pay | Admitting: Family Medicine

## 2024-10-06 VITALS — BP 130/77 | HR 74 | Temp 98.1°F | Ht 61.0 in | Wt 189.0 lb

## 2024-10-06 DIAGNOSIS — E890 Postprocedural hypothyroidism: Secondary | ICD-10-CM | POA: Diagnosis not present

## 2024-10-06 DIAGNOSIS — R252 Cramp and spasm: Secondary | ICD-10-CM

## 2024-10-06 DIAGNOSIS — E782 Mixed hyperlipidemia: Secondary | ICD-10-CM | POA: Diagnosis not present

## 2024-10-06 DIAGNOSIS — E538 Deficiency of other specified B group vitamins: Secondary | ICD-10-CM

## 2024-10-06 LAB — LIPID PANEL

## 2024-10-06 MED ORDER — POTASSIUM CHLORIDE ER 10 MEQ PO TBCR: 10.0000 meq | EXTENDED_RELEASE_TABLET | Freq: Every day | ORAL | 3 refills | Status: AC

## 2024-10-06 MED ORDER — MAGNESIUM OXIDE -MG SUPPLEMENT 400 (240 MG) MG PO TABS: 400.0000 mg | ORAL_TABLET | Freq: Every day | ORAL | 3 refills | Status: AC

## 2024-10-06 MED ORDER — LEVOTHYROXINE SODIUM 75 MCG PO TABS: 75.0000 ug | ORAL_TABLET | Freq: Every day | ORAL | 1 refills | Status: AC

## 2024-10-06 MED ORDER — MIRABEGRON ER 50 MG PO TB24: 50.0000 mg | ORAL_TABLET | Freq: Every day | ORAL | 1 refills | Status: AC

## 2024-10-06 NOTE — Progress Notes (Signed)
 Subjective:  Patient ID: Renee Maldonado, female    DOB: Mar 13, 1946  Age: 78 y.o. MRN: [id number]  CC: Medical Management of Chronic Issues (On Tuesday pt will be having cataracts surgery on right eye/Dec 15th pt will have left done/)   HPI  Discussed the use of AI scribe software for clinical note transcription with the patient, who gave verbal consent to proceed.  History of Present Illness She is a 78 year old female who presents for a follow-up visit regarding cataract surgery evaluation.  She is scheduled for cataract surgery on her right eye on Tuesday, with the left eye planned for December.  She experiences dry eyes characterized by itching, burning, and rubbing sensations. Over-the-counter eye drops were ineffective, and she has been prescribed Restasis  to manage the condition.  She has frequent nocturnal urination and was told by a urologist that she may have an overactive bladder. She has an upcoming appointment in January with a urologist.  She uses an albuterol  inhaler once or twice a day, two puffs each time, as needed. She carries it with her for use as required.  She has not been taking her vitamin B12 shots for the past three months and is due for a B12 level check to determine if she needs to continue the shots.  She takes magnesium  supplements for leg cramps, which she finds effective. She is uncertain about the need to continue potassium supplements.  She received a pneumonia shot at [facility].  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          10/06/2024   10:59 AM 07/06/2024    1:00 PM 07/06/2024   12:52 PM  Depression screen PHQ 2/9  Decreased Interest 0 0 0  Down, Depressed, Hopeless 0 0 0  PHQ - 2 Score 0 0 0  Altered sleeping 2 1   Tired, decreased energy 1 0   Change in appetite 0 0   Feeling bad or failure about yourself  0 0   Trouble concentrating 0 0   Moving slowly or fidgety/restless 0 0   Suicidal  thoughts 0 0   PHQ-9 Score 3 1    Difficult doing work/chores Not difficult at all Not difficult at all      Data saved with a previous flowsheet row definition    History Lillee has a past medical history of Anxiety, Arthritis, Cancer (HCC), COPD (chronic obstructive pulmonary disease) (HCC), GERD (gastroesophageal reflux disease), Humerus fracture (2007), Hyperlipidemia, Hypertension, Insomnia, Lung cancer (HCC), OA (osteoarthritis) of knee (10/08/2020), and Thyroid  cancer (HCC) (2023).   She has a past surgical history that includes Total knee arthroplasty; Left shoulder surgery; Joint replacement; Tubal ligation (1978); Breast surgery (Right, 01/2019); Breast lumpectomy (Right, 02/2019); Total knee arthroplasty (Right, 10/08/2020); Bronchial biopsy (04/15/2022); Bronchial needle aspiration biopsy (04/15/2022); Bronchial brushings (04/15/2022); Video bronchoscopy with radial endobronchial ultrasound (04/15/2022); Fiducial marker placement (04/15/2022); Colonoscopy (2022); Thyroidectomy (Right, 10/10/2022); Radical neck dissection (Right, 10/10/2022); and Esophagogastroduodenoscopy (egd) with propofol  (N/A, 01/02/2023).   Her family history includes Alzheimer's disease in her mother; Cancer in her brother; Diabetes in her mother; Hypertension in her father; Stroke in her father.She reports that she quit smoking about 2 years ago. Her smoking use included cigarettes. She started smoking about 42 years ago. She has a 10 pack-year smoking history. She has never used smokeless tobacco. She reports current alcohol  use of about 4.0 standard drinks of alcohol  per week. She reports that  she does not use drugs.    ROS Review of Systems  Constitutional: Negative.   HENT:  Negative for congestion.   Eyes:  Negative for visual disturbance.  Respiratory:  Negative for shortness of breath.   Cardiovascular:  Negative for chest pain.  Gastrointestinal:  Negative for abdominal pain, constipation, diarrhea,  nausea and vomiting.  Genitourinary:  Negative for difficulty urinating.  Musculoskeletal:  Negative for arthralgias and myalgias.  Neurological:  Negative for headaches.  Psychiatric/Behavioral:  Negative for sleep disturbance.     Objective:  BP 130/77   Pulse 74   Temp 98.1 F (36.7 C)   Ht 5' 1 (1.549 m)   Wt 189 lb (85.7 kg)   SpO2 95%   BMI 35.71 kg/m   BP Readings from Last 3 Encounters:  10/06/24 130/77  08/15/24 131/71  07/06/24 136/77    Wt Readings from Last 3 Encounters:  10/06/24 189 lb (85.7 kg)  08/15/24 190 lb (86.2 kg)  07/06/24 184 lb 9.6 oz (83.7 kg)     Physical Exam Physical Exam GENERAL: Alert, cooperative, well developed, no acute distress HEENT: Normocephalic, normal oropharynx, moist mucous membranes CHEST: Clear to auscultation bilaterally, No wheezes, rhonchi, or crackles CARDIOVASCULAR: Normal heart rate and rhythm, S1 and S2 normal without murmurs ABDOMEN: Soft, non-tender, non-distended, without organomegaly, Normal bowel sounds EXTREMITIES: No cyanosis or edema NEUROLOGICAL: Cranial nerves grossly intact, Moves all extremities without gross motor or sensory deficit   Assessment & Plan:  Vitamin B12 deficiency -     Vitamin B12 -     CBC with Differential/Platelet  Postoperative hypothyroidism -     Levothyroxine  Sodium; Take 1 tablet (75 mcg total) by mouth daily before breakfast.  Dispense: 90 tablet; Refill: 1 -     TSH + free T4  Leg cramps -     Magnesium  -     CBC with Differential/Platelet -     CMP14+EGFR -     Lipid panel  Mixed hyperlipidemia -     CBC with Differential/Platelet -     CMP14+EGFR -     Lipid panel  Other orders -     Magnesium  Oxide -Mg Supplement; Take 1 tablet (400 mg total) by mouth daily.  Dispense: 90 tablet; Refill: 3 -     Potassium Chloride  ER; Take 1 tablet (10 mEq total) by mouth daily.  Dispense: 180 tablet; Refill: 3 -     Mirabegron  ER; Take 1 tablet (50 mg total) by mouth daily.   Dispense: 90 tablet; Refill: 1    Assessment and Plan Assessment & Plan Postprocedural hypothyroidism   Thyroid  function requires monitoring due to medication adherence. Ordered thyroid  function test.  Vitamin B12 deficiency   Previous B12 level was adequate, but it is unclear if this was due to supplementation or natural levels. B12 injections were discontinued three months ago. Ordered B12 level test.  Mixed hyperlipidemia   Cholesterol levels require monitoring. Ordered cholesterol test.  Cramp and spasm   Magnesium  supplementation is effective for muscle cramps. Continue magnesium  supplementation.  Hypokalemia   Potassium levels require monitoring to determine the necessity of supplementation. Ordered potassium level test.  Overactive bladder   Experiencing nocturia. Myrbetriq  is considered due to its lower likelihood of exacerbating dry eyes compared to other medications. Prescribed Myrbetriq  for 90 days. Advised to monitor response to Myrbetriq  and adjust treatment as needed.  Dry eye syndrome   Experiencing symptoms of dry eyes. Restasis  has been prescribed. Myrbetriq  may exacerbate dry eyes  but is less likely to do so compared to other medications. Continue Restasis  for dry eyes. Monitor for exacerbation of dry eyes with Myrbetriq .  General Health Maintenance   Received flu and pneumonia vaccinations at [facility].       Follow-up: Return in about 6 months (around 04/05/2025) for Hypothyroidism, hypertension, cholesterol.  Butler Der, M.D.

## 2024-10-07 LAB — CBC WITH DIFFERENTIAL/PLATELET
Basophils Absolute: 0 x10E3/uL (ref 0.0–0.2)
Basos: 0 %
EOS (ABSOLUTE): 0.1 x10E3/uL (ref 0.0–0.4)
Eos: 2 %
Hematocrit: 39.2 % (ref 34.0–46.6)
Hemoglobin: 12.1 g/dL (ref 11.1–15.9)
Immature Grans (Abs): 0 x10E3/uL (ref 0.0–0.1)
Immature Granulocytes: 0 %
Lymphocytes Absolute: 1.2 x10E3/uL (ref 0.7–3.1)
Lymphs: 23 %
MCH: 26.5 pg — ABNORMAL LOW (ref 26.6–33.0)
MCHC: 30.9 g/dL — ABNORMAL LOW (ref 31.5–35.7)
MCV: 86 fL (ref 79–97)
Monocytes Absolute: 0.5 x10E3/uL (ref 0.1–0.9)
Monocytes: 9 %
Neutrophils Absolute: 3.4 x10E3/uL (ref 1.4–7.0)
Neutrophils: 66 %
Platelets: 267 x10E3/uL (ref 150–450)
RBC: 4.57 x10E6/uL (ref 3.77–5.28)
RDW: 13.6 % (ref 11.7–15.4)
WBC: 5.3 x10E3/uL (ref 3.4–10.8)

## 2024-10-07 LAB — CMP14+EGFR
ALT: 13 IU/L (ref 0–32)
AST: 16 IU/L (ref 0–40)
Albumin: 4.5 g/dL (ref 3.8–4.8)
Alkaline Phosphatase: 72 IU/L (ref 49–135)
BUN/Creatinine Ratio: 20 (ref 12–28)
BUN: 28 mg/dL — ABNORMAL HIGH (ref 8–27)
Bilirubin Total: 0.4 mg/dL (ref 0.0–1.2)
CO2: 25 mmol/L (ref 20–29)
Calcium: 9.7 mg/dL (ref 8.7–10.3)
Chloride: 100 mmol/L (ref 96–106)
Creatinine, Ser: 1.43 mg/dL — ABNORMAL HIGH (ref 0.57–1.00)
Globulin, Total: 1.9 g/dL (ref 1.5–4.5)
Glucose: 96 mg/dL (ref 70–99)
Potassium: 4.3 mmol/L (ref 3.5–5.2)
Sodium: 138 mmol/L (ref 134–144)
Total Protein: 6.4 g/dL (ref 6.0–8.5)
eGFR: 37 mL/min/1.73 — ABNORMAL LOW (ref >59)

## 2024-10-07 LAB — LIPID PANEL
Chol/HDL Ratio: 3 ratio (ref 0.0–4.4)
Cholesterol, Total: 131 mg/dL (ref 100–199)
HDL: 44 mg/dL (ref >39)
LDL Chol Calc (NIH): 60 mg/dL (ref 0–99)
Triglycerides: 159 mg/dL — ABNORMAL HIGH (ref 0–149)
VLDL Cholesterol Cal: 27 mg/dL (ref 5–40)

## 2024-10-07 LAB — MAGNESIUM: Magnesium: 2.2 mg/dL (ref 1.6–2.3)

## 2024-10-07 LAB — TSH+FREE T4
Free T4: 1.76 ng/dL (ref 0.82–1.77)
TSH: 1.18 u[IU]/mL (ref 0.450–4.500)

## 2024-10-07 LAB — VITAMIN B12: Vitamin B-12: 326 pg/mL (ref 232–1245)

## 2024-10-09 ENCOUNTER — Ambulatory Visit: Payer: Self-pay | Admitting: Family Medicine

## 2024-10-09 NOTE — Progress Notes (Signed)
Hello Paraskevi,  Your lab result is normal and/or stable.Some minor variations that are not significant are commonly marked abnormal, but do not represent any medical problem for you.  Best regards, Hrishikesh Hoeg, M.D.

## 2024-10-11 DIAGNOSIS — H25813 Combined forms of age-related cataract, bilateral: Secondary | ICD-10-CM | POA: Diagnosis not present

## 2024-10-11 DIAGNOSIS — H2511 Age-related nuclear cataract, right eye: Secondary | ICD-10-CM | POA: Diagnosis not present

## 2024-10-11 DIAGNOSIS — H268 Other specified cataract: Secondary | ICD-10-CM | POA: Diagnosis not present

## 2024-10-17 NOTE — Progress Notes (Signed)
 Follow up call to discuss chest CT scan results from 10/20/24.  Pain/cough/SOB?

## 2024-10-18 ENCOUNTER — Ambulatory Visit: Payer: Self-pay

## 2024-10-18 VITALS — BP 130/77 | HR 74 | Ht 61.0 in | Wt 189.0 lb

## 2024-10-18 DIAGNOSIS — Z0000 Encounter for general adult medical examination without abnormal findings: Secondary | ICD-10-CM

## 2024-10-18 HISTORY — PX: CATARACT EXTRACTION: SUR2

## 2024-10-18 NOTE — Progress Notes (Signed)
 Chief Complaint  Patient presents with   Medicare Wellness     Subjective:   Renee Maldonado is a 78 y.o. female who presents for a Medicare Annual Wellness Visit.  Visit info / Clinical Intake: Medicare Wellness Visit Type:: Subsequent Annual Wellness Visit Persons participating in visit and providing information:: patient Medicare Wellness Visit Mode:: Telephone If telephone:: video declined If Telephone or Video please confirm:: I connected with patient using audio/video enable telemedicine. I verified patient identity with two identifiers, discussed telehealth limitations, and patient agreed to proceed. Patient Location:: home Provider Location:: home office Interpreter Needed?: No Pre-visit prep was completed: no AWV questionnaire completed by patient prior to visit?: no Living arrangements:: lives with spouse/significant other Patient's Overall Health Status Rating: very good Typical amount of pain: none Does pain affect daily life?: no Are you currently prescribed opioids?: no  Dietary Habits and Nutritional Risks How many meals a day?: 3 Most meals are obtained by: preparing own meals In the last 2 weeks, have you had any of the following?: (!) nausea, vomiting, diarrhea Diabetic:: no  Functional Status Activities of Daily Living (to include ambulation/medication): Independent Ambulation: Independent with device- listed below Home Assistive Devices/Equipment: Cane Medication Administration: Independent Home Management (perform basic housework or laundry): Independent Manage your own finances?: yes Primary transportation is: driving Concerns about hearing?: no  Fall Screening Falls in the past year?: 0 Number of falls in past year: 0 Was there an injury with Fall?: 0 Fall Risk Category Calculator: 0 Patient Fall Risk Level: Low Fall Risk  Fall Risk Patient at Risk for Falls Due to: No Fall Risks Fall risk Follow up: Falls evaluation completed; Education  provided  Home and Transportation Safety: All rugs have non-skid backing?: yes All stairs or steps have railings?: (!) no Grab bars in the bathtub or shower?: yes Have non-skid surface in bathtub or shower?: yes Good home lighting?: yes Regular seat belt use?: yes Hospital stays in the last year:: no  Cognitive Assessment Difficulty concentrating, remembering, or making decisions? : no Will 6CIT or Mini Cog be Completed: yes What year is it?: 0 points What month is it?: 0 points Give patient an address phrase to remember (5 components): [address] [place] what time is it?: 0 points Count backwards from 20 to 1: 0 points Say the months of the year in reverse: 0 points Repeat the address phrase from earlier: 0 points 6 CIT Score: 0 points  Advance Directives (For Healthcare) Does Patient Have a Medical Advance Directive?: No Type of Advance Directive: Living will; Healthcare Power of Attorney Would patient like information on creating a medical advance directive?: No - Patient declined  Reviewed/Updated  Reviewed/Updated: Reviewed All (Medical, Surgical, Family, Medications, Allergies, Care Teams, Patient Goals); Medical History; Surgical History; Family History; Medications; Allergies; Care Teams; Patient Goals    Allergies (verified) Benadryl  [diphenhydramine  hcl] and Penicillins   Current Medications (verified) Outpatient Encounter Medications as of 10/18/2024  Medication Sig   albuterol  (VENTOLIN  HFA) 108 (90 Base) MCG/ACT inhaler 2 puffs in lungs every 6 hours as needed for wheezing/short of breath.   alendronate  (FOSAMAX ) 70 MG tablet TAKE ONE TABLET EVERY 7 DAYS WITH full GLASS of water  ON AN EMPTY STOMACH. DO not lie down FOR TWO HOURS   amLODipine  (NORVASC ) 5 MG tablet TAKE ONE TABLET ONCE DAILY   Calcium  Carb-Cholecalciferol 600-20 MG-MCG TABS Take 1 tablet by mouth in the morning.   chlorpheniramine  (CHLOR-TRIMETON ) 4 MG tablet Take 4 mg by  mouth in the  morning and at bedtime.   cholecalciferol (VITAMIN D3) 25 MCG (1000 UNIT) tablet Take 1,000 Units by mouth in the morning.   famotidine  (PEPCID ) 40 MG tablet TAKE 1 TABLET DAILY   hydrochlorothiazide  (HYDRODIURIL ) 25 MG tablet Take 1 tablet (25 mg total) by mouth daily.   hydrOXYzine  (ATARAX /VISTARIL ) 25 MG tablet Take 25 mg by mouth every 4 (four) hours as needed for itching.   L-Lysine  500 MG TABS Take 500 mg by mouth at bedtime.   levothyroxine  (SYNTHROID ) 75 MCG tablet Take 1 tablet (75 mcg total) by mouth daily before breakfast.   magnesium  oxide (MAG-OX) 400 (240 Mg) MG tablet Take 1 tablet (400 mg total) by mouth daily.   Melatonin 10 MG CAPS Take 10 mg by mouth at bedtime.   mirabegron  ER (MYRBETRIQ ) 50 MG TB24 tablet Take 1 tablet (50 mg total) by mouth daily.   nystatin powder Apply 1 Application topically 3 (three) times daily.   potassium chloride  (KLOR-CON  10) 10 MEQ tablet Take 1 tablet (10 mEq total) by mouth daily.   RESTASIS  0.05 % ophthalmic emulsion Place 1 drop into both eyes 2 (two) times daily as needed (dry eyes).    rosuvastatin  (CRESTOR ) 20 MG tablet TAKE ONE TABLET DAILY   sodium chloride  (OCEAN) 0.65 % SOLN nasal spray Place 1 spray into both nostrils as needed for congestion.   traZODone  (DESYREL ) 100 MG tablet Take 1 tablet (100 mg total) by mouth at bedtime.   valACYclovir  (VALTREX ) 1000 MG tablet TAKE TWO TABLETS TWICE DAILY FOR ONE DAY FOR COLD SORES   cyanocobalamin  (VITAMIN B12) 1000 MCG/ML injection Inject 1 mL (1,000 mcg total) into the skin every 30 (thirty) days. (Patient not taking: Reported on 10/18/2024)   Polyethyl Glycol-Propyl Glycol (LUBRICANT EYE DROPS) 0.4-0.3 % SOLN Place 1-2 drops into both eyes 3 (three) times daily as needed (dry/irritated eyes.). (Patient not taking: Reported on 10/18/2024)   No facility-administered encounter medications on file as of 10/18/2024.    History: Past Medical History:  Diagnosis Date   Anxiety    Arthritis     Cancer (HCC)    breast Right no chemo or radiation  on Arimidex    COPD (chronic obstructive pulmonary disease) (HCC)    on Breo   GERD (gastroesophageal reflux disease)    Humerus fracture 2007   Hyperlipidemia    Hypertension    Insomnia    Lung cancer (HCC)    OA (osteoarthritis) of knee 10/08/2020   Thyroid  cancer (HCC) 2023   Past Surgical History:  Procedure Laterality Date   BREAST LUMPECTOMY Right 02/2019   BREAST SURGERY Right 01/2019   breast biopsy   BRONCHIAL BIOPSY  04/15/2022   Procedure: BRONCHIAL BIOPSIES;  Surgeon: Brenna Adine CROME, DO;  Location: [facility];  Service: Pulmonary;;   BRONCHIAL BRUSHINGS  04/15/2022   Procedure: BRONCHIAL BRUSHINGS;  Surgeon: Brenna Adine CROME, DO;  Location: [facility];  Service: Pulmonary;;   BRONCHIAL NEEDLE ASPIRATION BIOPSY  04/15/2022   Procedure: BRONCHIAL NEEDLE ASPIRATION BIOPSIES;  Surgeon: Brenna Adine CROME, DO;  Location: [facility];  Service: Pulmonary;;   COLONOSCOPY  2022   ESOPHAGOGASTRODUODENOSCOPY (EGD) WITH PROPOFOL  N/A 01/02/2023   Procedure: ESOPHAGOGASTRODUODENOSCOPY (EGD) WITH PROPOFOL ;  Surgeon: Cindie Carlin POUR, DO;  Location: [facility];  Service: Endoscopy;  Laterality: N/A;  12:45 pm, pt can't come earlier   FIDUCIAL MARKER PLACEMENT  04/15/2022   Procedure: FIDUCIAL MARKER PLACEMENT;  Surgeon: Brenna Adine CROME, DO;  Location: [facility];  Service: Pulmonary;;   JOINT REPLACEMENT     left knee   Left shoulder surgery     2007   RADICAL NECK DISSECTION Right 10/10/2022   Procedure: CENTRAL NECK DISSECTION;  Surgeon: Luciano Standing, MD;  [facility];  Service: ENT;  Laterality: Right;   THYROIDECTOMY Right 10/10/2022   Procedure: RIGHT THYROID  LOBECTOMY;  Surgeon: Luciano Standing, MD;  Location: [facility] OR;  Service: ENT;  Laterality: Right;   TOTAL KNEE ARTHROPLASTY     2012 left   TOTAL KNEE ARTHROPLASTY Right 10/08/2020   Procedure: TOTAL KNEE ARTHROPLASTY;  Surgeon: Melodi Lerner, MD;   Location: [facility];  Service: Orthopedics;  Laterality: Right;   TUBAL LIGATION  1978   VIDEO BRONCHOSCOPY WITH RADIAL ENDOBRONCHIAL ULTRASOUND  04/15/2022   Procedure: VIDEO BRONCHOSCOPY WITH RADIAL ENDOBRONCHIAL ULTRASOUND;  Surgeon: Brenna Adine CROME, DO;  Location: [facility];  Service: Pulmonary;;   Family History  Problem Relation Age of Onset   Alzheimer's disease Mother    Diabetes Mother    Stroke Father    Hypertension Father    Cancer Brother    Liver disease Neg Hx    Social History   Occupational History   Occupation: retired  Tobacco Use   Smoking status: Former    Current packs/day: 0.00    Average packs/day: 0.3 packs/day for 40.0 years (10.0 ttl pk-yrs)    Types: Cigarettes    Start date: 06/17/1982    Quit date: 06/17/2022    Years since quitting: 2.3   Smokeless tobacco: Never   Tobacco comments:    smokes 3 a day  Vaping Use   Vaping status: Former   Start date: 07/18/2018   Quit date: 07/19/2019  Substance and Sexual Activity   Alcohol  use: Yes    Alcohol /week: 4.0 standard drinks of alcohol     Types: 4 Standard drinks or equivalent per week    Comment: occasional, up to 4oz of vodka or whiskey per day for a week, then nothing for months.   Drug use: Never   Sexual activity: Not Currently   Tobacco Counseling Counseling given: Yes Tobacco comments: smokes 3 a day  SDOH Screenings   Food Insecurity: No Food Insecurity (10/18/2024)  Housing: Low Risk  (10/18/2024)  Transportation Needs: No Transportation Needs (10/18/2024)  Utilities: Not At Risk (10/18/2024)  Alcohol  Screen: Low Risk  (08/07/2023)  Depression (PHQ2-9): Low Risk  (10/18/2024)  Financial Resource Strain: Low Risk  (08/07/2023)  Physical Activity: Insufficiently Active (10/18/2024)  Social Connections: Socially Integrated (10/18/2024)  Stress: No Stress Concern Present (10/18/2024)  Tobacco Use: Medium Risk (10/18/2024)  Health Literacy: Adequate Health Literacy (10/18/2024)   See flowsheets  for full screening details  Depression Screen PHQ 2 & 9 Depression Scale- Over the past 2 weeks, how often have you been bothered by any of the following problems? Little interest or pleasure in doing things: 0 Feeling down, depressed, or hopeless (PHQ Adolescent also includes...irritable): 0 PHQ-2 Total Score: 0 Trouble falling or staying asleep, or sleeping too much: 0 Feeling tired or having little energy: 0 Poor appetite or overeating (PHQ Adolescent also includes...weight loss): 0 Feeling bad about yourself - or that you are a failure or have let yourself or your family down: 0 Trouble concentrating on things, such as reading the newspaper or watching television (PHQ Adolescent also includes...like school work): 0 Moving or speaking so slowly that other people could have noticed. Or the opposite - being so fidgety or restless that you have been  moving around a lot more than usual: 0 Thoughts that you would be better off dead, or of hurting yourself in some way: 0 PHQ-9 Total Score: 0 If you checked off any problems, how difficult have these problems made it for you to do your work, take care of things at home, or get along with other people?: Not difficult at all     Goals Addressed             This Visit's Progress    Patient Stated   On track    08/05/2022 AWV Goal: Exercise for General Health  Patient will verbalize understanding of the benefits of increased physical activity: Exercising regularly is important. It will improve your overall fitness, flexibility, and endurance. Regular exercise also will improve your overall health. It can help you control your weight, reduce stress, and improve your bone density. Over the next year, patient will increase physical activity as tolerated with a goal of at least 150 minutes of moderate physical activity per week.  You can tell that you are exercising at a moderate intensity if your heart starts beating faster and you start  breathing faster but can still hold a conversation. Moderate-intensity exercise ideas include: Walking 1 mile (1.6 km) in about 15 minutes Biking Hiking Golfing Dancing Water  aerobics Patient will verbalize understanding of everyday activities that increase physical activity by providing examples like the following: Yard work, such as: [employer] Gardening Washing windows or floors Patient will be able to explain general safety guidelines for exercising:  Before you start a new exercise program, talk with your health care provider. Do not exercise so much that you hurt yourself, feel dizzy, or get very short of breath. Wear comfortable clothes and wear shoes with good support. Drink plenty of water  while you exercise to prevent dehydration or heat stroke. Work out until your breathing and your heartbeat get faster.              Objective:    Today's Vitals   10/18/24 1402  BP: 130/77  Pulse: 74  Weight: 189 lb (85.7 kg)  Height: 5' 1 (1.549 m)   Body mass index is 35.71 kg/m.  Hearing/Vision screen Hearing Screening - Comments:: Pt have some hearing dif but not reading for hearing aids yet Vision Screening - Comments:: Pt wear glasses/pt goes to [facility] ov 1 week Immunizations and Health Maintenance Health Maintenance  Topic Date Due   Mammogram  04/12/2022   COVID-19 Vaccine (4 - 2025-26 season) 10/22/2024 (Originally 07/18/2024)   Bone Density Scan  08/06/2025   Medicare Annual Wellness (AWV)  10/18/2025   DTaP/Tdap/Td (3 - Td or Tdap) 08/20/2033   Pneumococcal Vaccine: 50+ Years  Completed   Influenza Vaccine  Completed   Hepatitis C Screening  Completed   Zoster Vaccines- Shingrix  Completed   Meningococcal B Vaccine  Aged Out   Hepatitis B Vaccines 19-59 Average Risk  Discontinued   Colonoscopy  Discontinued        Assessment/Plan:  This is  a routine wellness examination for Renee Maldonado.  Patient Care Team: Zollie Lowers, MD as PCP - General (Family Medicine) Raynold Selinda BRAVO, MD as Referring Physician (Orthopedic Surgery)  I have personally reviewed and noted the following in the patient's chart:   Medical and social history Use of alcohol , tobacco or illicit drugs  Current medications and supplements including opioid prescriptions. Functional ability  and status Nutritional status Physical activity Advanced directives List of other physicians Hospitalizations, surgeries, and ER visits in previous 12 months Vitals Screenings to include cognitive, depression, and falls Referrals and appointments  No orders of the defined types were placed in this encounter.  In addition, I have reviewed and discussed with patient certain preventive protocols, quality metrics, and best practice recommendations. A written personalized care plan for preventive services as well as general preventive health recommendations were provided to patient.   Ozie Ned, CMA   10/18/2024   Return in 1 year (on 10/18/2025).  After Visit Summary: (MyChart) Due to this being a telephonic visit, the after visit summary with patients personalized plan was offered to patient via MyChart   Nurse Notes: per pt already had mammogram done w/[facility] will request for resuluts

## 2024-10-20 ENCOUNTER — Ambulatory Visit (HOSPITAL_COMMUNITY)
Admission: RE | Admit: 2024-10-20 | Discharge: 2024-10-20 | Disposition: A | Source: Ambulatory Visit | Attending: Urology | Admitting: Urology

## 2024-10-20 DIAGNOSIS — R918 Other nonspecific abnormal finding of lung field: Secondary | ICD-10-CM | POA: Insufficient documentation

## 2024-10-20 DIAGNOSIS — H25812 Combined forms of age-related cataract, left eye: Secondary | ICD-10-CM | POA: Diagnosis not present

## 2024-10-20 DIAGNOSIS — C3431 Malignant neoplasm of lower lobe, right bronchus or lung: Secondary | ICD-10-CM | POA: Insufficient documentation

## 2024-10-20 DIAGNOSIS — C3490 Malignant neoplasm of unspecified part of unspecified bronchus or lung: Secondary | ICD-10-CM | POA: Diagnosis not present

## 2024-10-26 ENCOUNTER — Ambulatory Visit: Admitting: Urology

## 2024-10-27 ENCOUNTER — Ambulatory Visit
Admission: RE | Admit: 2024-10-27 | Discharge: 2024-10-27 | Disposition: A | Discharge status: Home / Self Care | Source: Ambulatory Visit | Attending: Urology | Admitting: Urology

## 2024-10-27 ENCOUNTER — Encounter: Payer: Self-pay | Admitting: Urology

## 2024-10-27 DIAGNOSIS — C3431 Malignant neoplasm of lower lobe, right bronchus or lung: Secondary | ICD-10-CM | POA: Diagnosis not present

## 2024-10-27 DIAGNOSIS — Z87891 Personal history of nicotine dependence: Secondary | ICD-10-CM | POA: Diagnosis not present

## 2024-10-27 DIAGNOSIS — C73 Malignant neoplasm of thyroid gland: Secondary | ICD-10-CM

## 2024-10-27 NOTE — Progress Notes (Signed)
 Radiation Oncology         [phone] ________________________________  Name: Renee Maldonado MRN: [id number]  Date: 10/27/2024  DOB: 10/21/1946  Post Treatment Note  CC: Zollie Lowers, MD  Zollie Lowers, MD  Diagnosis:   78 y.o. female with Stage IA, NSCLC, adenocarcinoma of the right lower lobe lung.  Interval Since Last Radiation:   2.5 years  06/02/22 - 06/09/22:   The target in the RLL lung was treated to 54 Gy in 3 fractions of 18 Gy  Narrative:  She tolerated radiation treatment relatively well with only mild fatigue.  Her post-treatment CT Chest scan from 04/24/23 showed increased consolidation and ground-glass in the superior portion of the right lower lobe and adjacent right upper lobe, likely evolving postradiation change, and an unchanged appearance of the other lung nodules. No definite disease progression or recurrence and follow up scans since that time have remained stable. She had a recent follow up CT Chest on 10/20/24 that shows stable radiation changes and an unchanged appearance of the additional bilateral lung nodules. We reviewed these results today.     She was also diagnosed with papillary thyroid  carcinoma around the same time as her lung cancer diagnosis and was seen by Dr. Llewellyn on 04/01/22 to discuss treatment. The recommendation was for surgical resection, likely hemithyroidectomy, to be scheduled after completing her lung treatments. She was quite anxious about the delay in treating the thyroid  cancer and was seen in consult with Dr. Luciano on 08/11/22. She subsequently had a right thyroid  lobectomy with right central neck dissection under the care of Dr. Luciano on 10/10/22 and surgical pathology confirmed [id number] papillary thyroid  carcinoma.  She was being followed by Dr. Dale in endocrinology at [facility] for surveillance labs for thyroid  but this is now being followed by her PCP, Dr. Zollie, and also continues in routine follow up with Dr. Lindbergh (medical oncologist at  [facility]) for her history of breast cancer.                   On review of systems, the patient states that she is doing well in general and remains without complaints. She specifically denies dysphagia, increased shortness of breath, chest pain or hemoptysis. She has a chronic dry cough with occasional mucous production but nothing persistent. She has not had recent fevers, chills or night sweats. She had a URI in October 2024 and unfortunately, fractured her right 7th rib with the coughing- seen on CT imaging in 08/2023, but the pain has resolved completely. She had a cataract removed from her right eye in Nov. 2025 and will have the left eye done next week. She reports a healthy appetite and is maintaining her weight. She remains quite active and overall, is quite pleased with her progress to date.    ALLERGIES:  is allergic to benadryl  [diphenhydramine  hcl] and penicillins.  Meds: Current Outpatient Medications  Medication Sig Dispense Refill   albuterol  (VENTOLIN  HFA) 108 (90 Base) MCG/ACT inhaler 2 puffs in lungs every 6 hours as needed for wheezing/short of breath. 8.5 g 5   alendronate  (FOSAMAX ) 70 MG tablet TAKE ONE TABLET EVERY 7 DAYS WITH full GLASS of water  ON AN EMPTY STOMACH. DO not lie down FOR TWO HOURS 13 tablet 3   amLODipine  (NORVASC ) 5 MG tablet TAKE ONE TABLET ONCE DAILY 90 tablet 3   Calcium  Carb-Cholecalciferol 600-20 MG-MCG TABS Take 1 tablet by mouth in the morning.     chlorpheniramine  (CHLOR-TRIMETON ) 4 MG tablet Take  4 mg by mouth in the morning and at bedtime.     cholecalciferol (VITAMIN D3) 25 MCG (1000 UNIT) tablet Take 1,000 Units by mouth in the morning.     cyanocobalamin  (VITAMIN B12) 1000 MCG/ML injection Inject 1 mL (1,000 mcg total) into the skin every 30 (thirty) days. 1 mL 11   famotidine  (PEPCID ) 40 MG tablet TAKE 1 TABLET DAILY 90 tablet 0   hydrochlorothiazide  (HYDRODIURIL ) 25 MG tablet Take 1 tablet (25 mg total) by mouth daily. 90 tablet 1   hydrOXYzine   (ATARAX /VISTARIL ) 25 MG tablet Take 25 mg by mouth every 4 (four) hours as needed for itching.     L-Lysine  500 MG TABS Take 500 mg by mouth at bedtime.     levothyroxine  (SYNTHROID ) 75 MCG tablet Take 1 tablet (75 mcg total) by mouth daily before breakfast. 90 tablet 1   magnesium  oxide (MAG-OX) 400 (240 Mg) MG tablet Take 1 tablet (400 mg total) by mouth daily. 90 tablet 3   Melatonin 10 MG CAPS Take 10 mg by mouth at bedtime.     mirabegron  ER (MYRBETRIQ ) 50 MG TB24 tablet Take 1 tablet (50 mg total) by mouth daily. 90 tablet 1   nystatin powder Apply 1 Application topically 3 (three) times daily.     Polyethyl Glycol-Propyl Glycol (LUBRICANT EYE DROPS) 0.4-0.3 % SOLN Place 1-2 drops into both eyes 3 (three) times daily as needed (dry/irritated eyes.).     potassium chloride  (KLOR-CON  10) 10 MEQ tablet Take 1 tablet (10 mEq total) by mouth daily. 180 tablet 3   RESTASIS  0.05 % ophthalmic emulsion Place 1 drop into both eyes 2 (two) times daily as needed (dry eyes).      rosuvastatin  (CRESTOR ) 20 MG tablet TAKE ONE TABLET DAILY 90 tablet 3   sodium chloride  (OCEAN) 0.65 % SOLN nasal spray Place 1 spray into both nostrils as needed for congestion.     traZODone  (DESYREL ) 100 MG tablet Take 1 tablet (100 mg total) by mouth at bedtime. 90 tablet 3   valACYclovir  (VALTREX ) 1000 MG tablet TAKE TWO TABLETS TWICE DAILY FOR ONE DAY FOR COLD SORES 20 tablet 5   No current facility-administered medications for this encounter.    Physical Findings:  vitals were not taken for this visit.  Pain Assessment Pain Score: 0-No pain/10  Unable to assess due to telephone follow up visit format.  Lab Findings: Lab Results  Component Value Date   WBC 5.3 10/06/2024   HGB 12.1 10/06/2024   HCT 39.2 10/06/2024   MCV 86 10/06/2024   PLT 267 10/06/2024     Radiographic Findings: CT Chest Wo Contrast Result Date: 10/26/2024 CLINICAL DATA:  Restaging non-small cell lung cancer. * Tracking Code: BO *  EXAM: CT CHEST WITHOUT CONTRAST TECHNIQUE: Multidetector CT imaging of the chest was performed following the standard protocol without IV contrast. RADIATION DOSE REDUCTION: This exam was performed according to the departmental dose-optimization program which includes automated exposure control, adjustment of the mA and/or kV according to patient size and/or use of iterative reconstruction technique. COMPARISON:  03/15/2024 FINDINGS: Cardiovascular: The heart is normal in size. No pericardial effusion. The aorta is normal in caliber. Stable atherosclerotic calcification. Stable calcifications around the aortic valve. Stable coronary artery calcifications. Mediastinum/Nodes: Small scattered mediastinal and hilar lymph nodes no mass or overt adenopathy. The esophagus is unremarkable. Surgical changes from a right thyroid  lobe resection. Lungs/Pleura: Overall stable appearing post treatment changes involving the right lung with extensive radiation fibrosis. The masslike  area in the right lower lobe is stable in size measuring 3.4 x 2.9 cm. It has definitely smaller and more contracted when compared to the study from 2024. I do not see any findings suspicious for recurrent tumor. Stable area of irregular nodularity in the right upper lobe adjacent to a nearby fiducials. This is likely scarring change. No new or progressive findings. Stable very faint ground-glass opacity in the right lower lobe on image number 100/4. This measures 14 mm and is unchanged. Recommend continued surveillance. There are a few small scattered pulmonary nodules which are stable. No new or progressive findings. No acute pulmonary process. No pleural lesions. Upper Abdomen: No significant upper abdominal findings. No hepatic or adrenal gland lesions. Stable vascular calcifications. No upper abdominal adenopathy. Stable hyperdense/hemorrhagic cyst projecting off the upper pole region of the right kidney. Musculoskeletal: No breast masses,  supraclavicular or axillary adenopathy. The bony thorax is intact. No worrisome lytic or sclerotic lesions. IMPRESSION: 1. Stable post treatment changes involving the right lung with extensive radiation fibrosis. No findings suspicious for recurrent tumor. 2. Stable area of irregular nodularity in the right upper lobe adjacent to a nearby fiducial. This is likely scarring change. 3. Stable very faint ground-glass opacity in the right lower lobe. Recommend continued surveillance. 4. Stable small scattered pulmonary nodules. 5. No mediastinal or hilar mass or adenopathy. 6. Aortic atherosclerosis. Aortic Atherosclerosis (ICD10-I70.0). Electronically Signed   By: MYRTIS Stammer M.D.   On: 10/26/2024 10:51    Impression/Plan: 1. 78 y.o. female with Stage IA, NSCLC, adenocarcinoma of the right lower lobe lung. She has recovered well from the effects of her SBRT lung treatment and remains without complaints. Her recent post-treatment CT Chest scan from 10/20/24 shows stable radiation changes and an unchanged appearance of the additional bilateral lung nodules. No evidence of disease progression or recurrence. Therefore, we will continue with serial CT Chest scans every 6 months at [facility] to continue to closely monitor for any evidence of disease recurrence or progression. She will follow up either in person or by telephone, her preference, to review the results of each scan but she knows that she is welcome to call at any time in the interim with questions or concerns. She will continue her routine follow up with Dr. Zollie for labs s/p thyroidectomy and with Dr. Lindbergh for her history of breast cancer.   I personally spent 30 minutes in this encounter including chart review, reviewing radiological studies, face-to-face conversation with the patient, entering orders and completing documentation.     Sabra MICAEL Rusk, PA-C

## 2024-11-01 DIAGNOSIS — H25812 Combined forms of age-related cataract, left eye: Secondary | ICD-10-CM | POA: Diagnosis not present

## 2024-11-01 DIAGNOSIS — H268 Other specified cataract: Secondary | ICD-10-CM | POA: Diagnosis not present

## 2024-11-01 DIAGNOSIS — H5371 Glare sensitivity: Secondary | ICD-10-CM | POA: Diagnosis not present

## 2024-11-01 DIAGNOSIS — H2512 Age-related nuclear cataract, left eye: Secondary | ICD-10-CM | POA: Diagnosis not present

## 2024-11-16 ENCOUNTER — Ambulatory Visit (INDEPENDENT_AMBULATORY_CARE_PROVIDER_SITE_OTHER): Admitting: Family Medicine

## 2024-11-16 ENCOUNTER — Encounter: Payer: Self-pay | Admitting: Family Medicine

## 2024-11-16 VITALS — BP 124/70 | HR 68 | Temp 98.5°F | Ht 61.0 in | Wt 193.0 lb

## 2024-11-16 DIAGNOSIS — J069 Acute upper respiratory infection, unspecified: Secondary | ICD-10-CM

## 2024-11-16 DIAGNOSIS — J441 Chronic obstructive pulmonary disease with (acute) exacerbation: Secondary | ICD-10-CM

## 2024-11-16 LAB — VERITOR SARS-COV-2 AND FLU A+B
BD Veritor SARS-CoV-2 Ag: NEGATIVE
Influenza A: NEGATIVE
Influenza B: NEGATIVE

## 2024-11-16 MED ORDER — DOXYCYCLINE HYCLATE 100 MG PO TABS: 100.0000 mg | ORAL_TABLET | Freq: Two times a day (BID) | ORAL | 0 refills | Status: AC

## 2024-11-16 MED ORDER — PREDNISONE 20 MG PO TABS: 40.0000 mg | ORAL_TABLET | Freq: Every day | ORAL | 0 refills | Status: AC

## 2024-11-16 NOTE — Progress Notes (Signed)
 "  Acute Office Visit  Subjective:     Patient ID: Renee Maldonado, female    DOB: June 08, 1946, 78 y.o.   MRN: [id number]  Chief Complaint  Patient presents with   persistent cough congestion    HPI  History of Present Illness   Renee Maldonado is a 78 year old female with COPD and lung cancer who presents with cough, congestion, and sore throat.  Cough and respiratory symptoms - Cough present for three days, sometimes productive with 'milky color' sputum - No shortness of breath - Wheezing present with coughing - Pain localized to bilateral lower rib cage, associated with coughing - No cough prior to current illness onset - Increased use of Albuterol  inhaler recently; typically uses two puffs in the morning and at night, but not always adherent to schedule  Upper respiratory symptoms - Scratchy throat for three days - Nasal congestion present - Mild headaches, not severe enough to require aspirin - Chills experienced - Temperature of 99.61F the night before last; baseline temperature is 14F - No nausea, vomiting, or diarrhea  Ocular and otic symptoms - Eyes feel tired and watery, attributed to dry eyes - History of cataract surgery - No ear pain - Ear itchiness present  Symptom management - Self-medicating with Mucinex  (daytime and nighttime formulations), cough syrup, and cough drops, with some relief  Relevant oncologic and pulmonary history - COPD - History of lung cancer, stage one, treated with three radiation treatments in 2023 - Recent lung screening reportedly normal, report not yet reviewed - History of thyroid  cancer - Prior CT with lung scarring and fibrosis post radiation       ROS As per HPI.      Objective:    BP 124/70   Pulse 68   Temp 98.5 F (36.9 C)   Ht 5' 1 (1.549 m)   Wt 193 lb (87.5 kg)   SpO2 96%   BMI 36.47 kg/m    Physical Exam Vitals and nursing note reviewed.  Constitutional:      General: She is not in acute distress.     Appearance: She is not ill-appearing, toxic-appearing or diaphoretic.  HENT:     Right Ear: Tympanic membrane, ear canal and external ear normal.     Left Ear: Tympanic membrane, ear canal and external ear normal.     Nose: Congestion present.     Mouth/Throat:     Mouth: Mucous membranes are moist.     Pharynx: Oropharynx is clear. No pharyngeal swelling, oropharyngeal exudate, posterior oropharyngeal erythema or uvula swelling.     Tonsils: No tonsillar exudate. 0 on the right. 0 on the left.  Eyes:     General:        Right eye: No discharge.        Left eye: No discharge.  Cardiovascular:     Rate and Rhythm: Normal rate and regular rhythm.     Heart sounds: Normal heart sounds. No murmur heard. Pulmonary:     Effort: Pulmonary effort is normal. No respiratory distress.     Breath sounds: Examination of the right-upper field reveals wheezing. Examination of the left-upper field reveals wheezing. Examination of the left-middle field reveals wheezing. Wheezing present. No rhonchi or rales.  Musculoskeletal:     Cervical back: Neck supple. No rigidity.  Skin:    General: Skin is warm and dry.  Neurological:     General: No focal deficit present.     Mental Status: She is alert and  oriented to person, place, and time.  Psychiatric:        Mood and Affect: Mood normal.        Behavior: Behavior normal.     No results found for any visits on 11/16/24.      Assessment & Plan:   [name] was seen today for persistent cough congestion.  Diagnoses and all orders for this visit:  URI, acute -     Veritor SARS-CoV-2 and Flu A+B  COPD with acute exacerbation (HCC) -     predniSONE  (DELTASONE ) 20 MG tablet; Take 2 tablets (40 mg total) by mouth daily with breakfast for 5 days. -     doxycycline  (VIBRA -TABS) 100 MG tablet; Take 1 tablet (100 mg total) by mouth 2 (two) times daily for 7 days. 1 po bid   Assessment and Plan    Acute upper respiratory infection Likely viral  etiology with increased risk for secondary bacterial infection due to COPD and lung scarring. Negative Covid, flu today.  - Advised on contagious nature, recommended mask use and hand hygiene.  Chronic obstructive pulmonary disease with acute exacerbation Exacerbation likely due to current upper respiratory infection. Lung scarring increases infection susceptibility. - Prescribed doxycycline  twice a day for one week. - Prescribed prednisone , two tablets once a day for five days. - Advised to monitor for worsening symptoms and contact clinic if occurs.      Return to office for new or worsening symptoms, or if symptoms persist.    Renee Maldonado Search, FNP   "

## 2024-12-07 ENCOUNTER — Ambulatory Visit: Admitting: Urology

## 2024-12-07 ENCOUNTER — Encounter: Payer: Self-pay | Admitting: Urology

## 2024-12-07 VITALS — BP 124/71 | HR 68 | Ht 61.0 in | Wt 193.0 lb

## 2024-12-07 DIAGNOSIS — R300 Dysuria: Secondary | ICD-10-CM | POA: Diagnosis not present

## 2024-12-07 DIAGNOSIS — N3281 Overactive bladder: Secondary | ICD-10-CM

## 2024-12-07 LAB — URINALYSIS, ROUTINE W REFLEX MICROSCOPIC
Bilirubin, UA: NEGATIVE
Glucose, UA: NEGATIVE
Ketones, UA: NEGATIVE
Nitrite, UA: NEGATIVE
RBC, UA: NEGATIVE
Specific Gravity, UA: 1.02 (ref 1.005–1.030)
Urobilinogen, Ur: 0.2 mg/dL (ref 0.2–1.0)
pH, UA: 7 (ref 5.0–7.5)

## 2024-12-07 LAB — MICROSCOPIC EXAMINATION: Bacteria, UA: NONE SEEN

## 2024-12-07 LAB — BLADDER SCAN AMB NON-IMAGING: Scan Result: 4

## 2024-12-07 MED ORDER — TROSPIUM CHLORIDE ER 60 MG PO CP24: 1.0000 | ORAL_CAPSULE | Freq: Every day | ORAL | 11 refills | Status: AC

## 2024-12-07 NOTE — Progress Notes (Signed)
 Bladder Scan completed today due to reason OAB  Patient can void prior to the bladder scan. Bladder scan result: 4ml  Performed By: Veleria, CMA  Additional notes- Patient is scheduled to follow up with MD

## 2024-12-07 NOTE — Progress Notes (Signed)
 "  12/07/2024 11:19 AM   Renee Maldonado 21-Jun-1946 [id number]  Referring provider: Zollie Lowers, MD [address] [place],  [place] [address]  Urinary frequency and nocturia   HPI: Renee Maldonado is a 78yo here for evaluation of OAb, Nocturia, and occasional dysuria. For the past several years she has had worsening urinary frequency, nocturia and urinary incontinence. She was started on Mirabegron  50mg  for over 30 days which decreased her nocturia from 4x to 3x. She has constant urinary urgency. PVR 0cc. No straining to urinate. No issues with constipation. She has CKD and creatinine is 1.3-1.4   PMH: Past Medical History:  Diagnosis Date   Anxiety    Arthritis    Cancer (HCC)    breast Right no chemo or radiation  on Arimidex    COPD (chronic obstructive pulmonary disease) (HCC)    on Breo   GERD (gastroesophageal reflux disease)    Humerus fracture 2007   Hyperlipidemia    Hypertension    Insomnia    Lung cancer (HCC)    OA (osteoarthritis) of knee 10/08/2020   Thyroid  cancer (HCC) 2023    Surgical History: Past Surgical History:  Procedure Laterality Date   BREAST LUMPECTOMY Right 02/2019   BREAST SURGERY Right 01/2019   breast biopsy   BRONCHIAL BIOPSY  04/15/2022   Procedure: BRONCHIAL BIOPSIES;  Surgeon: Brenna Adine CROME, DO;  Location: [facility];  Service: Pulmonary;;   BRONCHIAL BRUSHINGS  04/15/2022   Procedure: BRONCHIAL BRUSHINGS;  Surgeon: Brenna Adine CROME, DO;  Location: [facility];  Service: Pulmonary;;   BRONCHIAL NEEDLE ASPIRATION BIOPSY  04/15/2022   Procedure: BRONCHIAL NEEDLE ASPIRATION BIOPSIES;  Surgeon: Brenna Adine CROME, DO;  Location: [facility];  Service: Pulmonary;;   CATARACT EXTRACTION  10/18/2024   COLONOSCOPY  2022   ESOPHAGOGASTRODUODENOSCOPY (EGD) WITH PROPOFOL  N/A 01/02/2023   Procedure: ESOPHAGOGASTRODUODENOSCOPY (EGD) WITH PROPOFOL ;  Surgeon: Cindie Carlin POUR, DO;  Location: [facility];  Service: Endoscopy;  Laterality: N/A;  12:45  pm, pt can't come earlier   FIDUCIAL MARKER PLACEMENT  04/15/2022   Procedure: FIDUCIAL MARKER PLACEMENT;  Surgeon: Brenna Adine CROME, DO;  Location: [facility];  Service: Pulmonary;;   JOINT REPLACEMENT     left knee   Left shoulder surgery     2007   RADICAL NECK DISSECTION Right 10/10/2022   Procedure: CENTRAL NECK DISSECTION;  Surgeon: Luciano Standing, MD;  Location: [facility];  Service: ENT;  Laterality: Right;   THYROIDECTOMY Right 10/10/2022   Procedure: RIGHT THYROID  LOBECTOMY;  Surgeon: Luciano Standing, MD;  Location: [facility];  Service: ENT;  Laterality: Right;   TOTAL KNEE ARTHROPLASTY     2012 left   TOTAL KNEE ARTHROPLASTY Right 10/08/2020   Procedure: TOTAL KNEE ARTHROPLASTY;  Surgeon: Melodi Lerner, MD;  Location: [facility];  Service: Orthopedics;  Laterality: Right;   TUBAL LIGATION  1978   VIDEO BRONCHOSCOPY WITH RADIAL ENDOBRONCHIAL ULTRASOUND  04/15/2022   Procedure: VIDEO BRONCHOSCOPY WITH RADIAL ENDOBRONCHIAL ULTRASOUND;  Surgeon: Brenna Adine CROME, DO;  Location: [facility];  Service: Pulmonary;;    Home Medications:  Allergies as of 12/07/2024       Reactions   Benadryl  [diphenhydramine  Hcl] Nausea Only   Dizziness   Penicillins    Had a rash around 20 years ago but unsure if it come from medicine.  Tolerated Cephalosporin Date: 10/09/20.        Medication List        Accurate as of December 07, 2024 11:19 AM. If  you have any questions, ask your nurse or doctor.          albuterol  108 (90 Base) MCG/ACT inhaler Commonly known as: VENTOLIN  HFA 2 puffs in lungs every 6 hours as needed for wheezing/short of breath.   alendronate  70 MG tablet Commonly known as: FOSAMAX  TAKE ONE TABLET EVERY 7 DAYS WITH full GLASS of water  ON AN EMPTY STOMACH. DO not lie down FOR TWO HOURS   amLODipine  5 MG tablet Commonly known as: NORVASC  TAKE ONE TABLET ONCE DAILY   Calcium  Carb-Cholecalciferol 600-20 MG-MCG Tabs Take 1 tablet by mouth in the morning.    Chlor-Trimeton  4 MG tablet Generic drug: chlorpheniramine  Take 4 mg by mouth in the morning and at bedtime.   cholecalciferol 25 MCG (1000 UNIT) tablet Commonly known as: VITAMIN D3 Take 1,000 Units by mouth in the morning.   cyanocobalamin  1000 MCG/ML injection Commonly known as: VITAMIN B12 Inject 1 mL (1,000 mcg total) into the skin every 30 (thirty) days.   famotidine  40 MG tablet Commonly known as: PEPCID  TAKE 1 TABLET DAILY   hydrochlorothiazide  25 MG tablet Commonly known as: HYDRODIURIL  Take 1 tablet (25 mg total) by mouth daily.   hydrOXYzine  25 MG tablet Commonly known as: ATARAX  Take 25 mg by mouth every 4 (four) hours as needed for itching.   L-Lysine  500 MG Tabs Take 500 mg by mouth at bedtime.   levothyroxine  75 MCG tablet Commonly known as: SYNTHROID  Take 1 tablet (75 mcg total) by mouth daily before breakfast.   Lubricant Eye Drops 0.4-0.3 % Soln Generic drug: Polyethyl Glycol-Propyl Glycol Place 1-2 drops into both eyes 3 (three) times daily as needed (dry/irritated eyes.).   magnesium  oxide 400 (240 Mg) MG tablet Commonly known as: MAG-OX Take 1 tablet (400 mg total) by mouth daily.   Melatonin 10 MG Caps Take 10 mg by mouth at bedtime.   mirabegron  ER 50 MG Tb24 tablet Commonly known as: Myrbetriq  Take 1 tablet (50 mg total) by mouth daily.   nystatin powder Apply 1 Application topically 3 (three) times daily.   potassium chloride  10 MEQ tablet Commonly known as: Klor-Con  10 Take 1 tablet (10 mEq total) by mouth daily.   Restasis  0.05 % ophthalmic emulsion Generic drug: cycloSPORINE  Place 1 drop into both eyes 2 (two) times daily as needed (dry eyes).   rosuvastatin  20 MG tablet Commonly known as: CRESTOR  TAKE ONE TABLET DAILY   sodium chloride  0.65 % Soln nasal spray Commonly known as: OCEAN Place 1 spray into both nostrils as needed for congestion.   traZODone  100 MG tablet Commonly known as: DESYREL  Take 1 tablet (100 mg  total) by mouth at bedtime.   valACYclovir  1000 MG tablet Commonly known as: VALTREX  TAKE TWO TABLETS TWICE DAILY FOR ONE DAY FOR COLD SORES        Allergies: Allergies[1]  Family History: Family History  Problem Relation Age of Onset   Alzheimer's disease Mother    Diabetes Mother    Stroke Father    Hypertension Father    Cancer Brother    Liver disease Neg Hx     Social History:  reports that she quit smoking about 2 years ago. Her smoking use included cigarettes. She started smoking about 42 years ago. She has a 10 pack-year smoking history. She has never used smokeless tobacco. She reports current alcohol  use of about 4.0 standard drinks of alcohol  per week. She reports that she does not use drugs.  ROS: All other review of  systems were reviewed and are negative except what is noted above in HPI  Physical Exam: BP 124/71   Pulse 68   Ht 5' 1 (1.549 m)   Wt 193 lb (87.5 kg)   BMI 36.47 kg/m   Constitutional:  Alert and oriented, No acute distress. HEENT:  AT, moist mucus membranes.  Trachea midline, no masses. Cardiovascular: No clubbing, cyanosis, or edema. Respiratory: Normal respiratory effort, no increased work of breathing. GI: Abdomen is soft, nontender, nondistended, no abdominal masses GU: No CVA tenderness.  Lymph: No cervical or inguinal lymphadenopathy. Skin: No rashes, bruises or suspicious lesions. Neurologic: Grossly intact, no focal deficits, moving all 4 extremities. Psychiatric: Normal mood and affect.  Laboratory Data: Lab Results  Component Value Date   WBC 5.3 10/06/2024   HGB 12.1 10/06/2024   HCT 39.2 10/06/2024   MCV 86 10/06/2024   PLT 267 10/06/2024    Lab Results  Component Value Date   CREATININE 1.43 (H) 10/06/2024    No results found for: PSA  No results found for: TESTOSTERONE  Lab Results  Component Value Date   HGBA1C 5.5 10/22/2016    Urinalysis    Component Value Date/Time   COLORURINE STRAW (A)  11/11/2022 1330   APPEARANCEUR Clear 07/06/2024 1328   LABSPEC 1.005 11/11/2022 1330   PHURINE 5.0 11/11/2022 1330   GLUCOSEU Negative 07/06/2024 1328   HGBUR MODERATE (A) 11/11/2022 1330   BILIRUBINUR Negative 07/06/2024 1328   KETONESUR NEGATIVE 11/11/2022 1330   PROTEINUR 1+ (A) 07/06/2024 1328   PROTEINUR NEGATIVE 11/11/2022 1330   UROBILINOGEN negative 04/03/2015 1039   NITRITE Negative 07/06/2024 1328   NITRITE NEGATIVE 11/11/2022 1330   LEUKOCYTESUR 1+ (A) 07/06/2024 1328   LEUKOCYTESUR NEGATIVE 11/11/2022 1330    Lab Results  Component Value Date   LABMICR See below: 07/06/2024   WBCUA 0-5 07/06/2024   LABEPIT 0-10 07/06/2024   MUCUS neg 04/03/2015   BACTERIA Few (A) 07/06/2024    Pertinent Imaging:  Results for orders placed during the hospital encounter of 11/01/22  DG Abd 1 View  Narrative CLINICAL DATA:  Abdominal pain.  EXAM: ABDOMEN - 1 VIEW  COMPARISON:  CT abdomen and pelvis 11/01/2022  FINDINGS: The upper abdomen was incompletely imaged. Gas is present in loops of small bowel throughout the abdomen without frank bowel dilatation to indicate mechanical obstruction. No abnormal abdominal or pelvic calcification is identified. There is asymmetrically severe left hip osteoarthrosis.  IMPRESSION: No evidence of bowel obstruction.   Electronically Signed By: Dasie Hamburg M.D. On: 11/11/2022 08:38  No results found for this or any previous visit.  No results found for this or any previous visit.  No results found for this or any previous visit.  No results found for this or any previous visit.  No results found for this or any previous visit.  No results found for this or any previous visit.  No results found for this or any previous visit.   Assessment & Plan:    1.  Overactive bladder (Primary) We will trial tropsium 60mg  daily.  - Urinalysis, Routine w reflex microscopic   No follow-ups on file.  Belvie Clara, MD  [facility] [place]       [1]  Allergies Allergen Reactions   Benadryl  [Diphenhydramine  Hcl] Nausea Only    Dizziness   Penicillins     Had a rash around 20 years ago but unsure if it come from medicine.   Tolerated Cephalosporin Date: 10/09/20.     "

## 2024-12-07 NOTE — Patient Instructions (Signed)

## 2024-12-23 ENCOUNTER — Other Ambulatory Visit: Payer: Self-pay | Admitting: Family Medicine

## 2024-12-23 DIAGNOSIS — K219 Gastro-esophageal reflux disease without esophagitis: Secondary | ICD-10-CM

## 2025-04-05 ENCOUNTER — Ambulatory Visit: Admitting: Family Medicine

## 2025-05-03 ENCOUNTER — Ambulatory Visit: Payer: Self-pay | Admitting: Urology

## 2025-10-19 ENCOUNTER — Ambulatory Visit
